# Patient Record
Sex: Female | Born: 1955 | Race: White | Hispanic: No | Marital: Married | Smoking: Former smoker
Health system: Southern US, Community
[De-identification: ages and names within clinical notes are randomized; demographics above are authoritative.]

## PROBLEM LIST (undated history)

## (undated) DIAGNOSIS — N12 Tubulo-interstitial nephritis, not specified as acute or chronic: Secondary | ICD-10-CM

## (undated) DIAGNOSIS — C801 Malignant (primary) neoplasm, unspecified: Secondary | ICD-10-CM

## (undated) DIAGNOSIS — N824 Other female intestinal-genital tract fistulae: Secondary | ICD-10-CM

## (undated) DIAGNOSIS — R42 Dizziness and giddiness: Secondary | ICD-10-CM

## (undated) DIAGNOSIS — N261 Atrophy of kidney (terminal): Secondary | ICD-10-CM

## (undated) DIAGNOSIS — E079 Disorder of thyroid, unspecified: Secondary | ICD-10-CM

## (undated) DIAGNOSIS — C539 Malignant neoplasm of cervix uteri, unspecified: Secondary | ICD-10-CM

## (undated) HISTORY — PX: CHOLECYSTECTOMY: SHX55

## (undated) HISTORY — DX: Other female intestinal-genital tract fistulae: N82.4

## (undated) HISTORY — PX: OTHER SURGICAL HISTORY: SHX169

## (undated) HISTORY — DX: Atrophy of kidney (terminal): N26.1

## (undated) HISTORY — DX: Tubulo-interstitial nephritis, not specified as acute or chronic: N12

## (undated) HISTORY — PX: EXPLORATORY LAPAROTOMY: SUR591

---

## 2001-06-17 HISTORY — PX: ABDOMINAL HYSTERECTOMY: SHX81

## 2001-07-20 ENCOUNTER — Other Ambulatory Visit: Admission: RE | Admit: 2001-07-20 | Discharge: 2001-07-20 | Payer: Self-pay | Admitting: *Deleted

## 2001-08-25 ENCOUNTER — Encounter (INDEPENDENT_AMBULATORY_CARE_PROVIDER_SITE_OTHER): Payer: Self-pay

## 2001-08-25 ENCOUNTER — Ambulatory Visit: Admission: RE | Admit: 2001-08-25 | Discharge: 2001-08-25 | Payer: Self-pay | Admitting: Gynecology

## 2001-08-25 ENCOUNTER — Ambulatory Visit: Admission: RE | Admit: 2001-08-25 | Discharge: 2001-11-23 | Payer: Self-pay | Admitting: Radiation Oncology

## 2001-08-26 ENCOUNTER — Ambulatory Visit (HOSPITAL_COMMUNITY): Admission: RE | Admit: 2001-08-26 | Discharge: 2001-08-26 | Payer: Self-pay | Admitting: Gynecology

## 2001-08-26 ENCOUNTER — Encounter: Payer: Self-pay | Admitting: Gynecology

## 2001-09-21 ENCOUNTER — Encounter (HOSPITAL_COMMUNITY): Admission: RE | Admit: 2001-09-21 | Discharge: 2001-12-20 | Payer: Self-pay | Admitting: Oncology

## 2001-10-27 ENCOUNTER — Inpatient Hospital Stay (HOSPITAL_COMMUNITY): Admission: RE | Admit: 2001-10-27 | Discharge: 2001-10-29 | Payer: Self-pay | Admitting: Radiation Oncology

## 2001-11-03 ENCOUNTER — Ambulatory Visit: Admission: RE | Admit: 2001-11-03 | Discharge: 2001-11-03 | Payer: Self-pay | Admitting: Gynecology

## 2001-11-30 ENCOUNTER — Ambulatory Visit: Admission: RE | Admit: 2001-11-30 | Discharge: 2001-11-30 | Payer: Self-pay | Admitting: Gynecologic Oncology

## 2001-12-15 ENCOUNTER — Inpatient Hospital Stay (HOSPITAL_COMMUNITY): Admission: RE | Admit: 2001-12-15 | Discharge: 2001-12-19 | Payer: Self-pay | Admitting: Gynecology

## 2001-12-15 ENCOUNTER — Encounter (INDEPENDENT_AMBULATORY_CARE_PROVIDER_SITE_OTHER): Payer: Self-pay

## 2002-01-15 ENCOUNTER — Encounter: Payer: Self-pay | Admitting: Emergency Medicine

## 2002-01-15 ENCOUNTER — Observation Stay (HOSPITAL_COMMUNITY): Admission: EM | Admit: 2002-01-15 | Discharge: 2002-01-16 | Payer: Self-pay | Admitting: Emergency Medicine

## 2002-02-03 ENCOUNTER — Ambulatory Visit: Admission: RE | Admit: 2002-02-03 | Discharge: 2002-02-03 | Payer: Self-pay | Admitting: Gynecology

## 2002-03-01 ENCOUNTER — Encounter: Payer: Self-pay | Admitting: Urology

## 2002-03-01 ENCOUNTER — Ambulatory Visit (HOSPITAL_COMMUNITY): Admission: RE | Admit: 2002-03-01 | Discharge: 2002-03-01 | Payer: Self-pay | Admitting: Urology

## 2002-03-24 ENCOUNTER — Encounter (INDEPENDENT_AMBULATORY_CARE_PROVIDER_SITE_OTHER): Payer: Self-pay | Admitting: Specialist

## 2002-03-24 ENCOUNTER — Ambulatory Visit: Admission: RE | Admit: 2002-03-24 | Discharge: 2002-03-24 | Payer: Self-pay | Admitting: Gynecology

## 2002-03-24 ENCOUNTER — Other Ambulatory Visit: Admission: RE | Admit: 2002-03-24 | Discharge: 2002-03-24 | Payer: Self-pay | Admitting: Gynecology

## 2002-05-19 ENCOUNTER — Ambulatory Visit: Admission: RE | Admit: 2002-05-19 | Discharge: 2002-05-19 | Payer: Self-pay | Admitting: Gynecology

## 2002-09-14 ENCOUNTER — Other Ambulatory Visit: Admission: RE | Admit: 2002-09-14 | Discharge: 2002-09-14 | Payer: Self-pay | Admitting: Gynecology

## 2002-09-14 ENCOUNTER — Ambulatory Visit: Admission: RE | Admit: 2002-09-14 | Discharge: 2002-09-14 | Payer: Self-pay | Admitting: Gynecology

## 2002-09-14 ENCOUNTER — Encounter (INDEPENDENT_AMBULATORY_CARE_PROVIDER_SITE_OTHER): Payer: Self-pay | Admitting: *Deleted

## 2003-01-11 ENCOUNTER — Encounter (INDEPENDENT_AMBULATORY_CARE_PROVIDER_SITE_OTHER): Payer: Self-pay | Admitting: *Deleted

## 2003-01-11 ENCOUNTER — Ambulatory Visit: Admission: RE | Admit: 2003-01-11 | Discharge: 2003-01-11 | Payer: Self-pay | Admitting: Gynecology

## 2003-01-11 ENCOUNTER — Other Ambulatory Visit: Admission: RE | Admit: 2003-01-11 | Discharge: 2003-01-11 | Payer: Self-pay | Admitting: Gynecology

## 2003-05-18 ENCOUNTER — Ambulatory Visit: Admission: RE | Admit: 2003-05-18 | Discharge: 2003-05-18 | Payer: Self-pay | Admitting: Gynecology

## 2003-05-18 ENCOUNTER — Encounter (INDEPENDENT_AMBULATORY_CARE_PROVIDER_SITE_OTHER): Payer: Self-pay | Admitting: Specialist

## 2003-05-18 ENCOUNTER — Other Ambulatory Visit: Admission: RE | Admit: 2003-05-18 | Discharge: 2003-05-18 | Payer: Self-pay | Admitting: Gynecology

## 2003-10-21 ENCOUNTER — Inpatient Hospital Stay (HOSPITAL_COMMUNITY): Admission: EM | Admit: 2003-10-21 | Discharge: 2003-10-26 | Payer: Self-pay | Admitting: Emergency Medicine

## 2003-10-22 HISTORY — PX: BILE DUCT EXPLORATION: SHX1225

## 2003-10-22 HISTORY — PX: CHOLECYSTECTOMY OPEN: SUR202

## 2003-10-23 ENCOUNTER — Encounter (INDEPENDENT_AMBULATORY_CARE_PROVIDER_SITE_OTHER): Payer: Self-pay | Admitting: Specialist

## 2003-11-16 ENCOUNTER — Ambulatory Visit (HOSPITAL_COMMUNITY): Admission: RE | Admit: 2003-11-16 | Discharge: 2003-11-16 | Payer: Self-pay | Admitting: General Surgery

## 2003-12-06 ENCOUNTER — Other Ambulatory Visit: Admission: RE | Admit: 2003-12-06 | Discharge: 2003-12-06 | Payer: Self-pay | Admitting: Gynecology

## 2003-12-06 ENCOUNTER — Encounter (INDEPENDENT_AMBULATORY_CARE_PROVIDER_SITE_OTHER): Payer: Self-pay | Admitting: *Deleted

## 2003-12-06 ENCOUNTER — Ambulatory Visit: Admission: RE | Admit: 2003-12-06 | Discharge: 2003-12-06 | Payer: Self-pay | Admitting: Gynecology

## 2004-01-09 ENCOUNTER — Ambulatory Visit (HOSPITAL_COMMUNITY): Admission: RE | Admit: 2004-01-09 | Discharge: 2004-01-09 | Payer: Self-pay | Admitting: General Surgery

## 2004-07-03 ENCOUNTER — Ambulatory Visit: Admission: RE | Admit: 2004-07-03 | Discharge: 2004-07-03 | Payer: Self-pay | Admitting: Gynecology

## 2004-07-03 ENCOUNTER — Other Ambulatory Visit: Admission: RE | Admit: 2004-07-03 | Discharge: 2004-07-03 | Payer: Self-pay | Admitting: Gynecology

## 2004-07-03 ENCOUNTER — Encounter (INDEPENDENT_AMBULATORY_CARE_PROVIDER_SITE_OTHER): Payer: Self-pay | Admitting: *Deleted

## 2005-01-11 ENCOUNTER — Ambulatory Visit: Admission: RE | Admit: 2005-01-11 | Discharge: 2005-01-11 | Payer: Self-pay | Admitting: Gynecology

## 2005-01-11 ENCOUNTER — Other Ambulatory Visit: Admission: RE | Admit: 2005-01-11 | Discharge: 2005-01-11 | Payer: Self-pay | Admitting: Gynecology

## 2005-01-11 ENCOUNTER — Encounter (INDEPENDENT_AMBULATORY_CARE_PROVIDER_SITE_OTHER): Payer: Self-pay | Admitting: *Deleted

## 2005-07-03 ENCOUNTER — Ambulatory Visit: Admission: RE | Admit: 2005-07-03 | Discharge: 2005-07-03 | Payer: Self-pay | Admitting: Gynecologic Oncology

## 2005-07-03 ENCOUNTER — Other Ambulatory Visit: Admission: RE | Admit: 2005-07-03 | Discharge: 2005-07-03 | Payer: Self-pay | Admitting: Gynecologic Oncology

## 2005-07-03 ENCOUNTER — Encounter (INDEPENDENT_AMBULATORY_CARE_PROVIDER_SITE_OTHER): Payer: Self-pay | Admitting: Specialist

## 2005-12-17 ENCOUNTER — Ambulatory Visit: Admission: RE | Admit: 2005-12-17 | Discharge: 2005-12-17 | Payer: Self-pay | Admitting: Gynecology

## 2005-12-17 ENCOUNTER — Encounter (INDEPENDENT_AMBULATORY_CARE_PROVIDER_SITE_OTHER): Payer: Self-pay | Admitting: Specialist

## 2005-12-17 ENCOUNTER — Other Ambulatory Visit: Admission: RE | Admit: 2005-12-17 | Discharge: 2005-12-17 | Payer: Self-pay | Admitting: Gynecology

## 2006-07-02 ENCOUNTER — Other Ambulatory Visit: Admission: RE | Admit: 2006-07-02 | Discharge: 2006-07-02 | Payer: Self-pay | Admitting: Gynecology

## 2006-07-02 ENCOUNTER — Ambulatory Visit: Admission: RE | Admit: 2006-07-02 | Discharge: 2006-07-02 | Payer: Self-pay | Admitting: Gynecology

## 2006-07-02 ENCOUNTER — Encounter (INDEPENDENT_AMBULATORY_CARE_PROVIDER_SITE_OTHER): Payer: Self-pay | Admitting: *Deleted

## 2007-01-09 ENCOUNTER — Ambulatory Visit: Admission: RE | Admit: 2007-01-09 | Discharge: 2007-01-09 | Payer: Self-pay | Admitting: Gynecology

## 2007-01-09 ENCOUNTER — Encounter: Payer: Self-pay | Admitting: Gynecology

## 2007-01-09 ENCOUNTER — Other Ambulatory Visit: Admission: RE | Admit: 2007-01-09 | Discharge: 2007-01-09 | Payer: Self-pay | Admitting: Gynecology

## 2007-12-30 ENCOUNTER — Encounter: Payer: Self-pay | Admitting: Gynecology

## 2007-12-30 ENCOUNTER — Ambulatory Visit: Admission: RE | Admit: 2007-12-30 | Discharge: 2007-12-30 | Payer: Self-pay | Admitting: Gynecology

## 2007-12-30 ENCOUNTER — Other Ambulatory Visit: Admission: RE | Admit: 2007-12-30 | Discharge: 2007-12-30 | Payer: Self-pay | Admitting: Gynecology

## 2008-10-19 ENCOUNTER — Ambulatory Visit (HOSPITAL_COMMUNITY): Admission: RE | Admit: 2008-10-19 | Discharge: 2008-10-19 | Payer: Self-pay | Admitting: Urology

## 2008-11-21 ENCOUNTER — Ambulatory Visit (HOSPITAL_BASED_OUTPATIENT_CLINIC_OR_DEPARTMENT_OTHER): Admission: RE | Admit: 2008-11-21 | Discharge: 2008-11-21 | Payer: Self-pay | Admitting: Urology

## 2008-12-22 ENCOUNTER — Ambulatory Visit (HOSPITAL_COMMUNITY): Admission: RE | Admit: 2008-12-22 | Discharge: 2008-12-22 | Payer: Self-pay | Admitting: Urology

## 2009-01-13 ENCOUNTER — Other Ambulatory Visit: Admission: RE | Admit: 2009-01-13 | Discharge: 2009-01-13 | Payer: Self-pay | Admitting: Gynecology

## 2009-01-13 ENCOUNTER — Ambulatory Visit: Admission: RE | Admit: 2009-01-13 | Discharge: 2009-01-13 | Payer: Self-pay | Admitting: Gynecology

## 2009-01-13 ENCOUNTER — Encounter: Payer: Self-pay | Admitting: Gynecology

## 2009-12-27 ENCOUNTER — Other Ambulatory Visit: Admission: RE | Admit: 2009-12-27 | Discharge: 2009-12-27 | Payer: Self-pay | Admitting: Neurology

## 2009-12-27 ENCOUNTER — Ambulatory Visit: Admission: RE | Admit: 2009-12-27 | Discharge: 2009-12-27 | Payer: Self-pay | Admitting: Gynecology

## 2010-09-24 LAB — POCT HEMOGLOBIN-HEMACUE: Hemoglobin: 14.4 g/dL (ref 12.0–15.0)

## 2010-10-30 NOTE — Consult Note (Signed)
[name], [name] NO.:  [id number]   MEDICAL RECORD NO.:  [id number]          PATIENT TYPE:  OUT   LOCATION:  GYN                          FACILITY:  [facility]   PHYSICIAN:  De Blanch, M.D.DATE OF BIRTH:  April 18, 1956   DATE OF CONSULTATION:  12/30/2007  DATE OF DISCHARGE:                                 CONSULTATION   CHIEF COMPLAINT:  Cervical cancer.   INTERVAL HISTORY:  The patient is here today for continuing followup of  cervical cancer.  Since her last visit, she has done well.  She is very  happy that she has been able to stop smoking for the last 4 months.  In  addition, she has had some weight loss which is a good thing.  From a  gynecologic point of view, she denies any GI or GU symptoms, has no  pelvic pain, pressure, vaginal bleeding or discharge.   Her functional status has been excellent.  She has only had one urinary  tract infection.  She is scheduled to see Dr. Wanda Plump in the near  future.  She is scheduled to have mammograms in September.   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  She was initially diagnosed and treated in 2003.  She  received external beam radiation therapy but because of poor anatomy,  was unable to have brachytherapy and therefore had a hysterectomy.  She  had postoperative radionecrosis of the upper vagina and transient  bilateral hydronephrosis.  This has been managed by Dr. Wanda Plump.   PAST MEDICAL HISTORY:  Medical illnesses:  None.   PAST SURGICAL HISTORY:  Total abdominal hysterectomy, bilateral salpingo-  oophorectomy.  Cholecystectomy.   OBSTETRICAL HISTORY:  Gravida 1.   CURRENT MEDICATIONS:  1. Premarin 0.625 mg daily.  2. Effexor 37.5 mg nightly.   SOCIAL HISTORY:  The patient is retired and married.  She is a former  smoker.   FAMILY HISTORY:  Negative for gynecologic, breast or colon cancer.   REVIEW OF SYSTEMS:  10-point comprehensive  review of systems negative  except as noted above.   PHYSICAL EXAMINATION:  VITAL SIGNS:  Weight 151 pounds (down 5 pounds  since last year).  GENERAL:  The patient is a healthy white female in no acute distress.  HEENT:  Negative.  NECK:  Supple without thyromegaly.  LYMPH:  There is no supraclavicular or inguinal adenopathy.  The abdomen  is soft, nontender, no masses, organomegaly, ascites or hernias noted.  PELVIC EXAM:  EGBUS, vagina, urethra are normal.  On bimanual exam,  however, there is a considerable amount of radiation fibrosis throughout  the pelvis.  No nodularity or masses are noted.   IMPRESSION:  Stage I-B squamous cell carcinoma of the cervix, 2003.  No  evidence of recurrent disease.   PLAN:  Pap smears are obtained.  The patient will return to see me in 1  year.  She will follow up with Dr. Wanda Plump regarding her urological  issues.      De Blanch, M.D.  Electronically Signed  DC/MEDQ  D:  12/30/2007  T:  12/30/2007  Job:  [id number]   cc:   Billie Lade, M.D.  Fax: [phone]   Veda Canning, R.N.  [address]  [place], [place] [address]   Boston Service, M.D.  Fax: [phone]

## 2010-10-30 NOTE — Consult Note (Signed)
NAMEELIM, PEALE NO.:  [id number]   MEDICAL RECORD NO.:  [id number]          PATIENT TYPE:  OUT   LOCATION:  GYN                          FACILITY:  [facility]   PHYSICIAN:  De Blanch, M.D.DATE OF BIRTH:  06/03/1956   DATE OF CONSULTATION:  01/13/2009  DATE OF DISCHARGE:                                 CONSULTATION   CHIEF COMPLAINT:  Cervical cancer, ureteral obstruction.   INTERVAL HISTORY:  The patient turns today for continuing followup now 7  years since radiation therapy and extrafascial hysterectomy for  treatment of a stage IB grade 1 squamous cell carcinoma of the cervix.  Since her last visit, she has seen Dr. Retta Diones with an obstructed left  ureter. A stent was placed and it has subsequently been removed.  The  patient denies any flank pain, has no fever or chills or any other  urinary tract symptoms.  I do not have all of the records, although the  patient seems to indicate that a Lasix renal scan showed adequate  function on the left without a stent.   HISTORY OF PRESENT ILLNESS:  Stage IB grade 1 squamous cell carcinoma of  the cervix initially diagnosed 2003.  She received external beam  radiation therapy.  She had poor anatomy and brachytherapy was  successful.  Therefore, she underwent an extrafascial hysterectomy  bilateral salpingo-oophorectomy.  She developed postoperative  radionecrosis of the upper vagina and transient bilateral  hydronephrosis.   PAST MEDICAL HISTORY/MEDICAL ILLNESSES:  None.   PAST SURGICAL HISTORY:  Total abdominal hysterectomy, bilateral salpingo-  oophorectomy, cholecystectomy.   OBSTETRICAL HISTORY:  Gravida 1.   CURRENT MEDICATIONS:  1. Premarin 0.625 mg daily.  2. Effexor 37.5 mg nightly.   SOCIAL HISTORY:  The patient is retired and married.  She is a former  smoker.   FAMILY HISTORY:  Negative for gynecologic, breast or colon cancer.   REVIEW OF SYSTEMS:  A 10-point comprehensive review  of systems negative  except as noted above.   PHYSICAL EXAMINATION:  VITAL SIGNS:  Weight 152 pounds, blood pressure  130/78.  GENERAL:  The patient is a healthy white female in no acute distress.  HEENT:  Negative.  NECK:  Supple without thyromegaly.  There is no supraclavicular or  inguinal adenopathy.  ABDOMEN:  Soft, nontender.  No mass, organomegaly, ascites or hernias  noted.  PELVIC:  EGBUS, vagina, bladder and urethra are normal.  Cervix  and uterus are surgically absent.  On bimanual examination, there is  extensive radiation fibrosis throughout the pelvis without any  nodularity, masses or pain.   IMPRESSION:  Stage IB grade 1 squamous cell carcinoma of the cervix  2003, no evidence of recurrent disease.   PLAN:  1. Pap smears were obtained.  2. The patient will continue under the care of Dr. Retta Diones regarding      her ureteral obstruction.  3. She will return to see me in 1 year for continuing followup.  4. The patient will arrange to have mammograms obtained in [place].      De Blanch, M.D.  Electronically  Signed     DC/MEDQ  D:  01/13/2009  T:  01/13/2009  Job:  [id number]   cc:   Billie Lade, M.D.  Fax: [phone]   [facility]   Bertram Millard. Dahlstedt, M.D.  Fax: [phone]   Telford Nab, R.N.  [address]  [place], [place] [address]

## 2010-10-30 NOTE — Consult Note (Signed)
NAMEKADAJAH, KJOS NO.:  [id number]   MEDICAL RECORD NO.:  [id number]          PATIENT TYPE:  OUT   LOCATION:  GYN                          FACILITY:  [facility]   PHYSICIAN:  De Blanch, M.D.DATE OF BIRTH:  01/03/1956   DATE OF CONSULTATION:  01/09/2007  DATE OF DISCHARGE:                                 CONSULTATION   CHIEF COMPLAINT:  Cervical cancer.   INTERVAL HISTORY:  Patient returns today for continuing followup of her  cervical cancer.  Five years ago, we performed a completion abdominal  hysterectomy, which was preceded by radiation therapy.  She has been  followed since that time with no evidence of recurrent disease.  She has  difficulties with her bladder.  Specifically, she has bladder spasms  about three to four times a year.  In addition, she has mild  hydronephrosis.  She saw Dr. Boston Service on July 3.  She  apparently is having slow resolution of her left-sided hydronephrosis.  There is no other evidence of urinary tract infection.  Patient denies  any other GI or GU symptoms, has no pelvic pain, pressure, vaginal  bleeding or discharge.  Functional status is excellent.  She continue to  work full-time.   HISTORY OF PRESENT ILLNESS:  Stage I-B, grade 1 squamous cell carcinoma  of the cervix, initially diagnosed in 2003.  She was treated with  external beam radiation therapy and then a completion abdominal  hysterectomy in July of 2003.  Brachytherapy could not be performed  because of anatomic distortion.  Patient had postoperative complications  of radionecrosis of the upper vagina and bilateral hydronephrosis.   PAST MEDICAL HISTORY:  MEDICAL ILLNESSES:  None.  PAST SURGICAL HISTORY:  TAH, BSO, cholecystectomy.  OBSTETRICAL HISTORY:  Gravida 1.   CURRENT MEDICATIONS:  1. Premarin 0.625 mg daily.  2. Effexor 37.5 mg nightly.   SOCIAL HISTORY:  The patient is retired.  She smokes one pack per day.   FAMILY HISTORY:   Negative for gynecologic, breast or colon cancer.   REVIEW OF SYSTEMS:  Ten-point comprehensive review of systems negative,  except as noted above.   PHYSICAL EXAM:  Weight 156 pounds, blood pressure 130/70, pulse 80,  respiratory rate 20.  GENERAL:  The patient is a healthy white female, in no acute distress.  HEENT:  Negative.  NECK:  Supple without thyromegaly.  There is no supraclavicular or  inguinal adenopathy.  ABDOMEN:  Soft, nontender, no masses, organomegaly, ascites or hernias  noted.  There is no CVA tenderness.  PELVIC EXAM:  EG, BUS normal.  The vagina has good length and caliber,  although there is a considerable amount of radiation fibrosis  throughout.  No lesions are noted.  Pap smears are obtained.  Bimanual  exam reveals radiation fibrosis throughout the pelvis.  Rectovaginal  exam confirms.  No masses or nodularity are noted.  LOWER EXTREMITIES:  Without edema or varicosities.   IMPRESSION:  Stage I-B squamous cell carcinoma of the cervix, 2003.  No  evidence of recurrent disease.  Pap smears are obtained.  The patient  was given renewed prescription for Premarin, Effexor and Aldactazide.  She will return to see me in one year.  She will continue to follow with  Dr. Boston Service with regard to her urinary tract symptoms.      De Blanch, M.D.  Electronically Signed     DC/MEDQ  D:  01/09/2007  T:  01/09/2007  Job:  [id number]   cc:   Boston Service, M.D.  Fax: [phone]   Billie Lade, M.D.  Fax: [phone]   Veda Canning, R.N.  [address]  [place], [place] [address]

## 2010-10-30 NOTE — Op Note (Signed)
[name], [name]                 ACCOUNT NO.:  [id number]   MEDICAL RECORD NO.:  [id number]          PATIENT TYPE:  AMB   LOCATION:  [facility]                         FACILITY:  [facility]   PHYSICIAN:  Bertram Millard. Dahlstedt, M.D.DATE OF BIRTH:  [birth year]   DATE OF PROCEDURE:  11/21/2008  DATE OF DISCHARGE:                               OPERATIVE REPORT   PREOPERATIVE DIAGNOSIS:  Left hydronephrosis with renal atrophy.   POSTOPERATIVE DIAGNOSIS:  Left hydronephrosis with renal atrophy with  left distal ureteral stricture.   SURGICAL PROCEDURES:  Cystoscopy, left retrograde ureteral pyelogram,  double-J stent placement on the left (24 cm x 5-French Polaris stent).   SURGEON:  Bertram Millard. Dahlstedt, M.D.   ANESTHESIA:  General with LMA.   COMPLICATIONS:  None.   BRIEF HISTORY:  A 55 year old female who is years out from hysterectomy  followed by radiation and chemotherapy for cervical cancer.  She has  been followed through the years by Dr. Wanda Plump.  She was recently  found to have significantly worsened hydronephrosis on the left with  actually fairly poor split renal function on that side.  It was  recommended the patient have, at the very least, temporary stent  placement followed by repeat study of her split renal functions.  If the  function in that left renal unit improved, it was recommended that she  undergo ureteroneocystostomy.  If there is no significant improvement,  we will deal with just that atrophic kidney, and take the stent out  without significant repair.  She is aware of risks and complications as  well as alternatives to the procedure.  She understands these and  desires to proceed.   DESCRIPTION OF PROCEDURE:  The patient was administered preoperative IV  antibiotics and the side of her surgery marked.  She was identified,  taken to the operating room where general anesthetic was administered  using LMA.  She was placed in the dorsal lithotomy position.   Genitalia  and perineum were prepped and draped.  Time-out was then called.   Procedure then commenced.  Her urethra was dilated to 24-French with  female sounds.  Bladder was then inspected circumferentially.  There  were generalized telangiectasias secondary to her radiation.  No lesions  were seen.  The bladder mucosa was somewhat pale, and there was no  evidence of trabeculations or foreign bodies.  Both ureteral orifices  were normal in configuration and location.  The left ureter was  cannulated with a 6-French open-ended catheter and retrograde performed.  This revealed moderate stricture of the distal 3-4 cm of the left  ureter.  There was hydro, especially above the L3 level on the left.  There was blunting in the caliceal system.  No filling defects were  seen.  At this point a guidewire was advanced through the catheter, and  the catheter removed.  A 24 cm x 5-French Polaris stent was then placed  using cystoscopic and fluoroscopic guidance.  After withdrawal the wire,  a good curl was seen proximally, and the distal end of the stent was  present just at the ureteral  orifice.  The bladder was then drained.  The procedure was terminated after the scope was removed.   The patient tolerated procedure well.  She was awakened and taken to the  PACU in stable condition.  She will follow up in approximately 2 weeks.  She was discharged on Urelle for bladder spasms as well as Bactrim DS  one p.o. b.i.d. for 3 days.      Bertram Millard. Dahlstedt, M.D.  Electronically Signed     SMD/MEDQ  D:  11/21/2008  T:  11/21/2008  Job:  [id number]   cc:   [place]

## 2010-11-02 NOTE — Consult Note (Signed)
[facility]  Patient:    [name], [name] Visit Number: [id number] MRN: [id number]          Service Type: SUR Location: *N Attending Physician:  Jeneen Montgomery Dictated by:   Rande Brunt Clarke-Pearson, M.D.   CC:         Billie Lade, M.D.  Telford Nab, R.N.   Consultation Report  RECONSULTATION  A 55 year old white female seen in consultation at the request of Dr. Antony Blackbird.  The patient has a stage IB 2 squamous cell carcinoma of the cervix which at initial evaluation was some 8 cm in diameter.  She has received whole pelvis and periaortic radiation therapy followed by an intracavitary cesium application on May 13.  At that time the examination under anesthesia revealed that she had had an excellent response, although her cervix was approximately 4-5 cm in diameter.  Currently, the patient has minimal vaginal discharge and no bleeding.  She has no GI or GU symptoms.  She seems to have tolerated the radiation therapy very well.  Dr. Roselind Messier is particularly interested in the possibility of me having perform a hysterectomy for completion of her therapy since she does still have a certain disease volume in her cervix as well as the fact that the uterus is enlarged from fibroids and feels that a second tandem and ovoid application might have poor anatomic placement.  PAST MEDICAL HISTORY:  None.  PAST SURGICAL HISTORY:  None.  OBSTETRICAL HISTORY:  Gravida 1.  MEDICATIONS:  None.  REVIEW OF SYSTEMS:  No GI, GU, or neurologic symptoms.  PHYSICAL EXAMINATION  GENERAL:  The patient is a pleasant white female in no acute distress.  HEENT:  Negative.  NECK:  Supple without thyromegaly.  LYMPH:  There is no supraclavicular or inguinal adenopathy.  ABDOMEN:  Mildly obese, soft, nontender.  No mass, organomegaly, ascites, or hernias are noted.  She does have some skin changes in the lower abdominal fold secondary to radiation  therapy.  PELVIC:  EGBUS normal.  Vagina is clean.  The grossly enlarged cervix is now absent and appears flush with the vaginal vault.  No vaginal lesions are noted.  On bimanual examination the cervix does feel hard.  It is approximately 5 cm in diameter.  The uterus is approximately [redacted] weeks gestational size and irregular consistent with fibroids.  IMPRESSION:  Stage IB 2 squamous cell carcinoma of the cervix with an excellent response to radiation therapy thus far.  I agree with Dr. Roselind Messier the patient does still have central disease and that hysterectomy would be appropriate for completion of her therapy.  I had a lengthy discussion with the patient and her husband regarding this recommendation.  Given the size of her cervix and the desire to obtain free surgical margins, I will believe a modified radical hysterectomy would be most appropriate.  In addition, we would like to obtain an adequate cervical vaginal margin.  Patient is in agreement with this plan and we will schedule surgery in conjunction with a gynecologist in [place].  The risk of surgery including hemorrhage, infection, injury to adjacent viscera and with an emphasis on bladder dysfunction caused by radical hysterectomy were outlined to the patient and her husband.  They are aware of these risks and wish to proceed. Dictated by:   Rande Brunt. Clarke-Pearson, M.D. Attending Physician:  Jeneen Montgomery DD:  11/03/01 TD:  11/05/01 Job: [id number]

## 2010-11-02 NOTE — Consult Note (Signed)
NAME:  [name], [name]                           ACCOUNT NO.:  [id number]   MEDICAL RECORD NO.:  [id number]                   PATIENT TYPE:  OUT   LOCATION:  GYN                                  FACILITY:  [facility]   PHYSICIAN:  Daniel L. Clarke-Pearson, M.D.      DATE OF BIRTH:  1955/10/22   DATE OF CONSULTATION:  03/24/2002  DATE OF DISCHARGE:                                   CONSULTATION   A 55 year old white female returns for continuing follow-up having undergone  a completion abdominal hysterectomy on December 15, 2001.  Final pathology showed  no residual tumor in the uterus.  The patient had previously been treated  with a full course of radiation therapy.  Postoperatively she developed  vaginal vault necrosis and ureteral obstruction with hydronephrosis.  She  was readmitted briefly with fever and nausea and vomiting.  Since I saw the  patient last in August, she has had steady improvement.  She says she is  especially better once she stopped taking the medications that she had been  prescribed and got them all out of her body.  She has been using a half-  strength peroxide douche on occasion, but has not been as consistent with  this as I had wished.  She notes some heaviness in the lower abdomen and  edema over the mons.  Today she denies any fevers, chills, or any GI or GU  symptoms.  She has no bladder or kidney problems.  She does have some  trouble with constipation.   FAMILY HISTORY:  Reviewed.   SOCIAL HISTORY:  Reviewed.  The patient comes accompanied by her husband  today.   REVIEW OF SYMPTOMS:  No cardiovascular, pulmonary, musculoskeletal,  neurologic, or GI symptoms except for constipation.   PHYSICAL EXAMINATION:  VITAL SIGNS:  Weight 133 pounds, blood pressure  160/100.  GENERAL:  The patient is a healthy, pleasant white female in no acute  distress.  HEENT:  Negative.  NECK:  Supple without thyromegaly.  LYMPH:  There is no supraclavicular or inguinal  adenopathy.  ABDOMEN:  Soft, nontender.  No mass, organomegaly, ascites, or hernias are  noted.  She does have a considerable amount of edema over her mons.  EXTREMITIES:  Lower extremities without edema.  PELVIC:  EGBUS, vagina, bladder, urethra are normal except that the upper  vaginal vault is still open, but necrosis is much less than before.  Bimanual examination reveals radiation fibrosis without any discreet masses  or nodularity.   IMPRESSION:  Radionecrosis of the upper vagina.  I have encouraged the  patient to become more dutiful in performing her peroxide douches in hopes  of cleaning up this area over the next couple of months.   She has also been evaluated by Dr. Boston Service and had a nuclear  medicine renal flow study showing slightly more function on the right than  the left with bilateral hydronephrosis, but no significant response to  Lasix  on either side.  Dr. Wanda Plump has felt that placement of the stent was  unnecessary but he plans on repeating a Lasix renal scan in approximately  two to three months.  She will return to see me shortly thereafter.                                               Daniel L. Stanford Breed, M.D.    DLC/MEDQ  D:  03/24/2002  T:  03/24/2002  Job:  [id number]   cc:   Boston Service, M.D.  [address]  [place]  [place] [address]  Fax: [phone]   Telford Nab, R.N.  [address] [place], [place] [address]  Fax: [phone]   [facility]  [address]   Billie Lade, M.D.  [address] - [facility]  [place]  [place]  [address]  Fax: [phone]

## 2010-11-02 NOTE — Consult Note (Signed)
NAME:  [name], [name]                           ACCOUNT NO.:  [id number]   MEDICAL RECORD NO.:  [id number]                   PATIENT TYPE:  OUT   LOCATION:  GYN                                  FACILITY:  [facility]   PHYSICIAN:  De Blanch, M.D.         DATE OF BIRTH:  [birth year]   DATE OF CONSULTATION:  12/06/2003  DATE OF DISCHARGE:                                   CONSULTATION   REASON FOR CONSULTATION:  A 55 year old white female returns for continuing  follow up of stage IB2 squamous cell carcinoma of the cervix.   INTERVAL HISTORY:  Since her last visit, the patient has had no  gynecological symptoms.  She specifically denies any vaginal bleeding or  discharge.  She is taking Premarin 0.625 mg daily and Effexor 37.5 mg q.h.s.  and is doing well with her hot flashes.  She has no other gynecologic  symptoms.   Since her last visit, she had had an open cholecystectomy and still has a T-  tube drain in place.   HISTORY OF PRESENT ILLNESS:  The patient was found to have a stage IB2  squamous cell carcinoma of the cervix in May of 2003 and received radiation  therapy.  Because of difficulty placing the tandem safely, she underwent a  complete hysterectomy in July of 2003.  She subsequently had postoperative  vault necrosis and partial ureteral obstruction which have both resolved.   PAST MEDICAL HISTORY:   MEDICAL ILLNESSES:  None.   PAST SURGICAL HISTORY:  TAHBSO.   OBSTETRICAL HISTORY:  Gravida 1.   CURRENT MEDICATIONS:  Premarin 0.625 mg p.o. and Effexor.   SOCIAL HISTORY:  The patient is retired.  She smokes 1 to 2 packs a day.   REVIEW OF SYSTEMS:  Negative except as noted above.   PHYSICAL EXAMINATION:  VITAL SIGNS:  Weight 135 pounds, blood pressure  118/74.  GENERAL:  The patient is a healthy white female in no acute distress.  HEENT:  Negative.  NECK:  Supple without thyromegaly.  LYMPHATICS:  There is no supraclavicular or inguinal adenopathy.  ABDOMEN:  Soft, nontender.  No masses, organomegaly, ascites, or hernias are  noted.  Her cholecystectomy scar is healing well.  PELVIC:  EGBUS, vagina, bladder, urethra are normal.  There is radiation  changes in the vagina and it is somewhat stenotic near the apex.  Bimanual  and rectovaginal exam reveals firm radiation fibrosis changes without any  discrete masses or nodularity.  Rectovaginal exam confirms.   IMPRESSION:  Stage IB2 squamous cell carcinoma of the cervix in May of 2003.  No evidence of recurrent disease.   PLAN:  Pap smears are obtained.  The patient is given a new prescription for  Effexor 37.5 mg and Premarin 0.625 mg daily.  She will return to see [place] in 6  months for continuing follow up.  De Blanch, M.D.    DC/MEDQ  D:  12/06/2003  T:  12/07/2003  Job:  [id number]   cc:   Boston Service, M.D.  [address]  [place]  [place] [address]  Fax: [phone]   Billie Lade, M.D.  [address] [facility]  [place]  [place] [address]  Fax: [phone]   Sung Amabile. Roslyn Smiling, M.D.  [address]  [place]  [place] [address]  Fax: [phone]   Telford Nab, R.N.  [phone]  [place], [place] [address]

## 2010-11-02 NOTE — Consult Note (Signed)
NAMEROCQUEL, ASKREN NO.:  [id number]   MEDICAL RECORD NO.:  [id number]          PATIENT TYPE:  OUT   LOCATION:  GYN                          FACILITY:  [facility]   PHYSICIAN:  De Blanch, M.D.DATE OF BIRTH:  Aug 23, 1955   DATE OF CONSULTATION:  DATE OF DISCHARGE:                                   CONSULTATION   CHIEF COMPLAINT:  Cervical cancer.   INTERVAL HISTORY:  Since her last visit, the patient has done well.  She  denies any GI or GU symptoms.  She has seen Dr. Wanda Plump recently, and he  will see her again in one year.  She has no pelvic pain, pressure, vaginal  bleeding, or discharge.  Her functional status is excellent.   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  Her postoperative course was  complicated by vaginal vault necrosis.  Since then, she has done well.  The  patient does have chronic bilateral hydronephrosis, followed by Dr. Boston Service.   PAST MEDICAL HISTORY:  None.   PAST SURGICAL HISTORY:  TAH/BSO, cholecystectomy.   OBSTETRICAL HISTORY:  Gravida 1.   CURRENT MEDICATIONS:  Premarin 0.625 mg daily, Effexor 37.5 mg q.h.s.   SOCIAL HISTORY:  Patient is retired.  She smokes one pack per day.   FAMILY HISTORY:  Negative for gynecologic, breast, or colon cancer.   REVIEW OF SYSTEMS:  A 10-point comprehensive review of systems is negative,  except as noted above.   PHYSICAL EXAMINATION:  VITAL SIGNS:  Weight 150 pounds.  Blood pressure  120/80, pulse 80, respiratory rate 20.  GENERAL:  Patient is a healthy white female in no acute distress.  HEENT:  Negative.  NECK:  Supple without thyromegaly.  There is no supraclavicular or inguinal  adenopathy.  ABDOMEN:  Soft and nontender.  No mass, organomegaly, ascites, or hernias  are noted.  PELVIC:  EG/BUS, vagina,  bladder, and urethra are normal.  Cervix and uterus  are surgically absent.  Bimanual and rectovaginal reveal considerable  radiate fibrosis without any nodularity or tenderness.  EXTREMITIES:  Lower extremities are without edema and varicosities.   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  Pap smears have been sent.   The patient will return to see [place] in six months.  Pap smears are obtained.  She is also given a new prescription for Premarin and a prescription for  Aldactazide 25 mg to be used for fluid retention as needed once daily.      De Blanch, M.D.  Electronically Signed     DC/MEDQ  D:  12/17/2005  T:  12/17/2005  Job:  [id number]   cc:   Billie Lade, M.D.  Fax: [phone]   Sung Amabile. Roslyn Smiling, M.D.  Fax: [phone]   Telford Nab, R.N.  [address]  [place], [place] [address]

## 2010-11-02 NOTE — Op Note (Signed)
[facility]  Patient:    [name], [name] Visit Number: [id number] MRN: [id number]          Service Type: GYN Location: 4W 0473 01 Attending Physician:  Antionette Char Dictated by:   Jackquline Denmark. [name], M.D. Proc. Date: 12/15/01 Admit Date:  12/15/2001   CC:         Telford Nab, R.N.  Quita Skye. Artis Flock, M.D.  Marnee Guarneri, M.D.  Sung Amabile Roslyn Smiling, M.D.   Operative Report  PREOPERATIVE DIAGNOSIS:  Stage 1B-2 squamous cell carcinoma of the cervix post chemo-radiation.  POSTOPERATIVE DIAGNOSIS: 1. Stage 1B-2 squamous cell carcinoma of the cervix post chemo-radiation. 2. Endometriosis. 3. Leiomyomata uteri. 4. Bilateral salpingo-oophorectomy.  OPERATION: 1. Exploratory laparotomy with modified radical hysterectomy (type 2). 2. Resection of left adnexal endometrioma.  SURGEON:  John T. [name], M.D.  ASSISTANT:  Roseanna Rainbow, M.D.  ANESTHESIA:  General endotracheal anesthesia.  DESCRIPTION OF FINDINGS AND INDICATION FOR SURGERY:  This 55 year old woman presented with a bulky, 8 cm in diameter, squamous cell carcinoma of the cervix.  She was treated with cold pelvic and aortic radiotherapy followed by an intracavitary cesium application.  She received cisplatin throughout and, because of suspicious findings for persistent disease and poor anatomy for a second placement of cesium, it was elected to perform a "completion" modified radical hysterectomy.  Prior to surgery, the patient was counselled extensively regarding risks and benefits, and questions were answered before informed consent was obtained.  Examination under anesthesia revealed a 12-week size uterus with expanded lower segment/cervical region and friability with central necrosis at the cervical os.  Upon entering the abdominal cavity, the uterus was found to be 12-week in size and involved multiple intramural leiomyomata.  The left tube and ovary formed a cystic  complex that was adherent to the distal sigmoid colon and cervix and left pelvic side wall, contributing to the overall impression of expanded lower uterine segment.  There was markedly reactive material in the left side wall region; frozen section biopsy revealed this to be necrosis with findings suggesting endometriosis.  Right tube and ovary were normal.  There was no palpable lymphadenopathy.  DESCRIPTION OF PROCEDURE:  The patient was prepped and draped in the low lithotomy position using direct placement stirrups with an indwelling Foley catheter.  Exploration was carried out through a transverse Pfannenstiel incision which was developed with sharp dissection and electrocautery for hemostasis.  The peritoneum was opened vertically.  Manual and visual exploration revealed no evidence of upper abdominal carcinomatosis or enlarged retroperitoneal nodes.  There was minimal reactive fluid in the pelvis.  The Buchwalter retractor was used to pack the bowel out of the pelvis and provide exposure.  The cornu of the uterus bilaterally were grasped with long Kelly clamps.  On the left side, a 4 cm mass was densely adherent to the distal sigmoid colon, posterior cervix, and side wall.  The round ligaments bilaterally were transected with electrocautery.  The anterior and posterior leaves of the broad ligament were opened with electrocautery and retroperitoneal spaces partially developed.  The ureters bilaterally were identified and infundibulopelvic ligaments isolated above the ureters, crossclamped, divided, and ligated by free tie and suture ligature. The spaces were fully developed and were markedly reactive from prior radiation.  The anterior vesicouterine ligaments were skeletonized and placed on traction. Each uterine artery was isolated and clipped at its origin in an attempt to obtain hemostasis.  The cardinal ligaments were developed and divided bilaterally with the reticulating  ILA  staplers.  Distal ureters were mobilized.  Using sharp and blunt dissection, the left adnexal mass was dissected off the rectum and left side wall.  Frozen section was obtained of markedly fibrotic tissue in the is region, and this returned positive for presumed endometriosis.  The bladder flap was developed with some difficulty as this was adherent, and it was mobilized further.  The ureters were isolated to the point of their insertion in to the ureter tunnels.  Parametrial tissue over the ureters were isolated with a right-angle clamp, crossclamped, divided, and suture ligated.  The ureters were rolled laterally and cardinal ligaments and parametrial tissues clamped inside the ureter, divided, and suture ligated.  Because of the fibrosis in the posterior pelvis, separate uterosacral ligament pedicles were not made.  The upper vagina was crossclamped, divided, and suture ligated with 0 Vicryl, and specimen was handed off.   Of note, after controlling the uterine vessels, the anterior cervical fibroids were removed to provide visualization throughout the surgery.  The vagina cuff was closed with interrupted figure-of-eight sutures of 0 Vicryl.  The pelvis was copiously irrigated and additional hemostasis achieved where necessary with electrocautery.  All sponges and retractors were removed, and the ureters were observed after instilling indigo carmine intravenously. There was no leakage of indigo carmine and no observed obstruction of the ureters to their point of entry in the bladder.  The sponges and retractors were removed and abdominal wall closed in layers using 0 Vicryl in a running closure of rectus fascia and skin clips to close the skin.  Because this was a type 2 radical hysterectomy, suprapubic catheter was not placed; catheter will  remain to straight drainage for two days, and postvoid residuals will be checked after catheter removal.  The patient was returned to the  recovery room in stable condition.  Estimated blood loss was 650 cc, transfusions none (intraoperative hemoglobin equals 10),  Sponge and instrument counts correct x 2.  Pathology specimen: Uterus, tubes, and ovaries. Dictated by:   Jackquline Denmark. [name], M.D. Attending Physician:  Antionette Char DD:  12/15/01 TD:  12/18/01 Job: [id number]

## 2010-11-02 NOTE — Consult Note (Signed)
NAME:  [name], [name]                           ACCOUNT NO.:  [id number]   MEDICAL RECORD NO.:  [id number]                   PATIENT TYPE:  OUT   LOCATION:  GYN                                  FACILITY:  [facility]   PHYSICIAN:  De Blanch, M.D.         DATE OF BIRTH:  1956-03-11   DATE OF CONSULTATION:  05/18/2003  DATE OF DISCHARGE:                                   CONSULTATION   REASON FOR CONSULTATION:  A 55 year old white female returns for continuing  followup of stage IB2 squamous cell carcinoma of the cervix.   INTERVAL HISTORY:  Since her last visit the patient has done remarkably  well.  She is recently having no GI or GU symptoms.  She denies any pelvic  pain, pressure, vaginal bleeding or discharge.   With regard to her hot flashes, she seems to be doing quite well using  Effexor 37.5 mg daily.  In fact, she is quite happy with results that she  has achieved.   HISTORY OF PRESENT ILLNESS:  The patient received radiation therapy in May  2003, for a stage IB2 squamous cell carcinoma of the cervix.  Because of  inadequate central dosing, she underwent a completion hysterectomy and  bilateral salpingo-oophorectomy in July 2003.  She had some postoperative  vault necrosis and partial ureteral obstruction which resolved.  The patient  continues to be under the care of Dr. Boston Service.   PAST MEDICAL HISTORY:  No medical illnesses.   PAST SURGICAL HISTORY:  TAH, BSO.   OBSTETRICAL HISTORY:  Gravida 1.   MEDICATIONS:  None.   SOCIAL HISTORY:  The patient is retired.  She smokes one to two packs per  day.   FAMILY HISTORY:  Negative for gynecologic, breast, or colon cancer.   REVIEW OF SYSTEMS:  Negative except as noted above.   PHYSICAL EXAMINATION:  VITAL SIGNS:  Weight 137 pounds (stable).  GENERAL:  The patient is a healthy white female who is in very good spirits.  HEENT:  Negative.  NECK:  Supple without thyromegaly.  There is no supraclavicular or  inguinal  adenopathy.  ABDOMEN:  Soft, nontender, no mass, organomegaly, ascites, or hernias are  noted.  PELVIC:  EGBUS, vagina, bladder, and urethra are normal.  There is some  radiation changes in the vagina, no lesions are noted.  It has good depth  and caliber.  Bimanual and rectovaginal exam reveal no masses, induration,  or nodularity.   IMPRESSION:  Stage IB2 squamous cell carcinoma of the cervix, May 2003, no  evidence of recurrent disease.   PLAN:  The patient will return to see [place] in six months and continue taking  her Effexor 37.5 mg a day, and Premarin 0.625 mg daily.  De Blanch, M.D.    DC/MEDQ  D:  05/18/2003  T:  05/18/2003  Job:  [id number]   cc:   Telford Nab, R.N.  [address]  [place], [place] [address]   Boston Service, M.D.  [address]  [place]  [place] [address]  Fax: [phone]   Billie Lade, M.D.  [address] [facility]  [place]  [place] [address]  Fax: [phone]   Sung Amabile. Roslyn Smiling, M.D.  [address]  [place]  [place] [address]  Fax: [phone]

## 2010-11-02 NOTE — Discharge Summary (Signed)
[facility]  Patient:    [name], [name] Visit Number: [id number] MRN: [id number]          Service Type: SUR Location: 2S 0275 01 Attending Physician:  Jeneen Montgomery Dictated by:   Billie Lade, M.D. Admit Date:  10/27/2001 Discharge Date: 10/29/2001   CC:         Reuel Boom L. Clarke-Pearson, M.D.  Aliene Altes, M.D.  Sung Amabile Roslyn Smiling, M.D.   Discharge Summary  DATE OF BIRTH:  19-Aug-1955  DIAGNOSIS:  Locally advanced squamous cell carcinoma of the cervix.  REASON FOR ADMISSION:  Intracavitary cesium-137 treatment.  DISCHARGE MEDICATIONS:  Vicodin one q.4-6h. p.r.n. pain.  The patient already has Compazine at home for nausea.  DISCHARGE INSTRUCTIONS:  The patient is to call if she should experience a temperature greater than 101.5 or experience severe abdominal pain or vaginal bleeding.  FOLLOWUP APPOINTMENT:  The patient is to see Dr. De Blanch on Nov 03, 2001, to determine if the patient will undergo an extrafascial hysterectomy or to receive with her second cesium intracavitary application.  NARRATIVE:  The patient is a 55 year old female who presented with vaginal bleeding.  She was found to have stage I B-II squamous cell carcinoma of the cervix with radiographically abnormal periaortic nodes.  She recently completed radiation treatment to the pelvis and periaortic node area totalling 4500 centigray.  She also received radiosensitizing cisplatinum-based chemotherapy at a reduced dose in light of her periaortic node radiation.  On the morning on Oct 27, 2001, the patient was taken to the operating room, and on examination under anesthesia, the patient was noted to have significant shrinkage of her large cervical mass.  Pretreatment, this measured approximately 8 x 9 cm in size, and on exam Oct 27, 2001, the lesion of her cervix measured approximately 4 x 5 cm in size.  The patient continued to have what appeared  significant involvement of the uterus with fibroids.  The patient had placement of a Fletcher-suit applicator in the lower uterus and proximal vagina.  After radiation planning at 4:45 p.m. on Oct 27, 2001, the patient had loading of her tandem with 20 mg, 10 mg cesium-137 source.  The ovoids were each loaded with 10 mg sources.  The patient subsequently had the cesium removed at 2:00 on Oct 29, 2001 without difficulty.  A radiation survey by Francena Hanly, certified medical dosimetrist showed no retained sources in the patient in her room or bed side. The patients Fletcher-suit applicator was subsequently removed without difficulty.  Later in the afternoon, the patient was discharged home, and will follow up with Dr. Stanford Breed as above.  When suming the dosage from the patients external beam radiation and her cesium application, the dose to point A thus far is approximately 7000 centigray. Dictated by:   Billie Lade, M.D. Attending Physician:  Jeneen Montgomery DD:  10/29/01 TD:  11/02/01 Job: [id number]

## 2010-11-02 NOTE — Consult Note (Signed)
NAME:  [name], [name]                           ACCOUNT NO.:  [id number]   MEDICAL RECORD NO.:  [id number]                   PATIENT TYPE:  OUT   LOCATION:  GYN                                  FACILITY:  [facility]   PHYSICIAN:  Daniel L. Clarke-Pearson, M.D.      DATE OF BIRTH:  02/25/1956   DATE OF CONSULTATION:  02/03/2002  DATE OF DISCHARGE:                                 GYN CONSULTATION   A 55 year old white female returns for interval assessment having felt  poorly over the past week to 10 days.  She claims she has had intermittent  constipation with diarrhea.  Constipation is treated with Fleet's enemas,  Senokot S, and Correctol.  She vomited yesterday (Tuesday) and the preceding  Saturday.  She notes a heaviness and fullness in the lower abdomen.  She  denies any fever or chills or any other GU symptoms.  She specifically  denies any flank pain or bladder pain or dysuria.   The patient ws recently readmitted to [facility] at which time it  was found that she had bilateral moderate to severe hydronephrosis which had  not been present preoperatively.  It was our interpretation at the time that  this was most likely secondary edema from surgical manipulation and  radiation therapy and it was likely it would resolve without a ureteral  stent.  Her renal function at that time was normal.  On the CAT scan there  was also suggestion of some air in the pelvic structures and a question of  pelvic cellulitis was raised and the patient was treated with intravenous  and then oral antibiotics.  She apparently completed her seven day course of  antibiotics without particular difficulty.   PAST SURGICAL HISTORY:  Modified radical hysterectomy and bilateral salpingo-  oophorectomy December 15, 2001.  At that time there was no residual viable tumor,  although patient did have endometriosis.   PHYSICAL EXAMINATION:  VITAL SIGNS:  Weight 127 pounds, blood pressure  112/86, temperature  99.8.  GENERAL:  The patient is a healthy white female who seems to be somewhat  anxious.  HEENT:  Negative.  NECK:  Supple without thyromegaly.  LYMPH:  There is no supraclavicular or inguinal adenopathy.  ABDOMEN:  Soft except for some thickening in the lower portion near the mons  or suprapubic region.  There are no masses, organomegaly, ascites, or  hernias noted.  She has a well healed Pfannenstiel incision.  PELVIC:  EGBUS normal.  Vagina is clean up to the apex where it is noted the  apex suture line is disrupted and there is a necrotic cavity at that area.  On bimanual examination the patient has extreme postoperative and radiation  fibrosis and induration.  No nodularity is noted.   IMPRESSION:  1. Cervical cancer status post radiation therapy followed by completion     hysterectomy.  No evidence of recurrent disease.  2. Radio necrosis of the upper  vagina and possible low grade pelvic     cellulitis and edema.  We will place the patient on Flagyl 500 mg t.i.d.     for 10 days and at the same time have her begin using half strength     peroxide douche b.i.d.   With regard to her other symptomatology, it is certainly possible that her  obstructed ureters could be leading to some of her poor feeling and  therefore we will refer her to a urologist for consideration and placement  of stents.  We will  assess renal function with a metabolic panel today and a CBC with  differential will be obtained to rule out any evidence of infection.  Following urological consultation I will have the patient return to see [place]  for continuing follow-up and evaluation.                                               Daniel L. Stanford Breed, M.D.    DLC/MEDQ  D:  02/03/2002  T:  02/03/2002  Job:  [id number]   cc:   Telford Nab, R.N.   [facility]  [address] Thayer Ohm, M.D.

## 2010-11-02 NOTE — Consult Note (Signed)
NAME:  [name], [name]                 ACCOUNT NO.:  [id number]   MEDICAL RECORD NO.:  [id number]          PATIENT TYPE:  OUT   LOCATION:  GYN                          FACILITY:  [facility]   PHYSICIAN:  Paola A. Duard Brady, MD    DATE OF BIRTH:  1955-07-13   DATE OF CONSULTATION:  07/03/2005  DATE OF DISCHARGE:                                   CONSULTATION   [name] is a very pleasant 55 year old who was diagnosed with a stage IB  II squamous cell carcinoma of the cervix.  She underwent external beam  radiotherapy followed by completion hysterectomy in July 2003.  Her  postoperative course was complicated by a vaginal vault necrosis, which was  managed conservatively and expectantly.  She was last seen by our service in  July 2006, at which time her exam and Pap smear were unremarkable.  She  comes in today for a follow-up.  She is overall doing quite well.  She  states that the Premarin vaginal cream she has been given causes her to have  UTI type symptoms.  Therefore, she has not been able to use it.  She denies  any bleeding, any change in her bowel or bladder habits,or any nausea,  vomiting, fevers, or chills.   HEALTH MAINTENANCE:  She had a mammogram in September 2006.   MEDICATIONS:  Effexor 37.5 mg daily.   SOCIAL HISTORY:  She smokes a pack per day.   PHYSICAL EXAMINATION:  VITAL SIGNS:  Weight 158 pounds, which is up 5 pounds  from her last visit.  Blood pressure 120/82.  GENERAL:  Well-nourished, well-developed female in no acute distress.  NECK:  Neck supple.  There is no lymphadenopathy.  No thyromegaly.  LUNGS:  Clear to auscultation bilaterally.  CARDIOVASCULAR:  Regular rate and rhythm.  ABDOMEN:  Well-healed surgical incision.  There is no evidence of any  incisional hernia.  Abdomen is soft, nontender, nondistended.  There are no  palpable masses or hepatosplenomegaly.  Groins are negative for adenopathy.  EXTREMITIES:  There is no edema.  PELVIC:  External genitalia  are within normal limits, though atrophic.  The  vagina is fibrotic at the upper portion.  There is no gross visible lesions.  ThinPrep Pap is done without difficulty.  Bimanual examination reveals  radiation fibrosis and thickening but no nodularity.  RECTAL:  Rectal confirms.  There is no stool in the vault for guaiac.   ASSESSMENT:  A 55 year old with a stage IB II squamous cell carcinoma of the  cervix with no evidence of recurrent disease.   PLAN:  1.  Follow up the results of her Pap smear from today.  2.  I refilled her Estrace cream and her Effexor.  3.  She will return to see [place] in 6 months or p.r.n.      Paola A. Duard Brady, MD  Electronically Signed     PAG/MEDQ  D:  07/03/2005  T:  07/03/2005  Job:  [id number]   cc:   [name], M.D.  Fax: [phone]   [name], M.D.  Fax:  [phone]   Telford Nab, R.N.  [address]  [place], [place] [address]

## 2010-11-02 NOTE — Consult Note (Signed)
NAME:  [name], [name]                           ACCOUNT NO.:  [id number]   MEDICAL RECORD NO.:  [id number]                   PATIENT TYPE:  OUT   LOCATION:  GYN                                  FACILITY:  [facility]   PHYSICIAN:  De Blanch, M.D.         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   INTERVAL HISTORY:  Since her last visit, she has done well.  She denies any  pelvic pain, pressure, or any other GI or GU symptoms.  She has no vaginal  bleeding.  She is still having difficulties with vasomotor symptoms.  She is  taking Premarin 0.625 mg daily.  When we had previously increased the dose  to 0.9 mg she became nauseated and had fluid retention.  More recently, she  has been on Effexor 50 mg, however, this makes her too sleepy during the  day.  She then broke the Effexor in half (25 mg), and this was ineffective.   HISTORY OF PRESENT ILLNESS:  The patient was found to have the squamous cell  carcinoma of the cervix initially treated with radiation therapy which was  completed in May 2003.  Because of inadequate central dosage, she underwent  a completion total abdominal hysterectomy, bilateral salpingo-oophorectomy  in July 2003.  Postoperative course was complicated by a vaginal vault  necrosis and partial ureteral obstruction.  This resolved under the care of  Dr. Boston Service.   Review of systems, family history, and social history are reviewed and  unchanged from previous notations.   PHYSICAL EXAMINATION:  VITAL SIGNS:  Weight 137 pounds, blood pressure  118/70.  GENERAL:  The patient is a healthy white female in no acute distress.  HEENT:  Negative.  NECK:  Supple without thyromegaly.  There is no  supraclavicular or inguinal  adenopathy.  ABDOMEN:  Soft, nontender, no mass, ascites, organomegaly, or hernias are  noted.  Her Pfannenstiel scar is well-healed.  PELVIC:  EGBUS, vagina, bladder, and urethra are normal.  Vagina has good  length and caliber. On bimanual exam there is a considerable amount of  radiation fibrosis without any nodularity or masses.  Rectovaginal  examination confirms.   IMPRESSION:  Stage IB2 squamous cell carcinoma of the cervix, status post  radiation therapy and completion hysterectomy.  The patient is clinically  free of disease.  Pap smears are obtained.   PLAN:  With regard to her hot flashes, she will continue taking Premarin  0.625 mg daily.  She is given a prescription for Effexor 37.5 mg to take  q.h.s.  She will take these for one month and contact us to indicate how she  is  doing.  She is scheduled to return to see [place] in four months for  continuing followup.                                                De Blanch, M.D.    DC/MEDQ  D:  01/11/2003  T:  01/11/2003  Job:  [id number]   cc:   Billie Lade, M.D.  [address] [facility]  [place]  [place]  [address]  Fax: [phone]   Boston Service, M.D.  [address]  [place]  [place] [address]  Fax: [phone]   Sung Amabile. Roslyn Smiling, M.D.  [address]  [place]  [place] [address]  Fax: [phone]   Telford Nab, R.N.  [phone] [address]  [place], [place] [address]

## 2010-11-02 NOTE — Consult Note (Signed)
NAME:  [name], [name] NO.:  [id number]   MEDICAL RECORD NO.:  [id number]                   PATIENT TYPE:   LOCATION:                                       FACILITY:   PHYSICIAN:  John T. [name], M.D.                 DATE OF BIRTH:   DATE OF CONSULTATION:  11/30/2001  DATE OF DISCHARGE:                                 GYN CONSULTATION   CHIEF COMPLAINT:  Stage IB-2 squamous cell carcinoma of the cervix post  chemoradiation for preoperative discussion of modified radical hysterectomy.   HISTORY OF PRESENT ILLNESS:  The patient was seen by Dr. Stanford Breed on  Nov 03, 2001, following completion of chemoradiation and an intracavitary  cesium application on Oct 27, 2001.  Although the patient had had an  excellent response, examination under anesthesia revealed that her cervix  was approximately 4 to 5 cm in diameter.  Given concern that she had  significant disease volume in the cervix, uterine enlargement from fibroids  and Dr. Trina Ao concerns that anatomy would be suboptimal for completion of  brachytherapy, she had been scheduled for a modified radical hysterectomy.  Unfortunately, due to family crisis, Dr. Stanford Breed was unable to  perform the surgery as scheduled and the procedure has been rescheduled for  December 15, 2001.   The patient is currently asymptomatic, without pelvic pain and with minimal  discharge.  Her past history, personal and social history, family history  and review of systems are reviewed and unchanged from those elicited by Dr.  Stanford Breed on Nov 03, 2001.   Examination is deferred.  Today's weight is 135 pounds with stable vital  signs; the patient is afebrile.   ASSESSMENT:  Stage IB-2 (bulky) cervical carcinoma post chemoradiation.  Uterine leiomyomata.   PLAN:  I had a discussion with the patient regarding risks and benefits of  completion hysterectomy.  Because of the perception of uterine expansion,  we  plan a modified radical hysterectomy and bilateral salpingo-oophorectomy.  Because of the prior chemoradiation and increased risk for ureteral  difficulties, in the face of a radical pelvic dissection, lymph nodes would  not be removed unless there was evidence of malignant lymphadenopathy.  I  discussed complications including GI and GU damage, hemorrhage,  thromboembolic complications, and lymphedema.  The patient's healing will be  compromised because of her prior chemoradiation and we discussed this.  Questions were answered and the patient wishes to proceed with surgery on  December 15, 2001.                                               John T. [name], M.D.    JTS/MEDQ  D:  02/10/2002  T:  02/10/2002  Job:  [id number]   cc:  Billie Lade, M.D.   Roseanna Rainbow, M.D.   Merilynn Finland, M.D.  [address] [facility]  [place]  [place] [address]  Fax: [phone]   Sung Amabile. Roslyn Smiling, M.D.   Telford Nab, R.N.

## 2010-11-02 NOTE — Discharge Summary (Signed)
NAME:  [name], [name]                           ACCOUNT NO.:  [id number]   MEDICAL RECORD NO.:  [id number]                   PATIENT TYPE:  INP   LOCATION:  0482                                 FACILITY:  [facility]   PHYSICIAN:  Anselm Pancoast. Zachery Dakins, M.D.          DATE OF BIRTH:  [birth year]   DATE OF ADMISSION:  10/21/2003  DATE OF DISCHARGE:  10/26/2003                                 DISCHARGE SUMMARY   Report inaudible - not transcribable.                                               Anselm Pancoast. Zachery Dakins, M.D.    WJW/MEDQ  D:  11/11/2003  T:  11/12/2003  Job:  [id number]

## 2010-11-02 NOTE — Op Note (Signed)
NAME:  [name], [name]                           ACCOUNT NO.:  [id number]   MEDICAL RECORD NO.:  [id number]                   PATIENT TYPE:  INP   LOCATION:  0482                                 FACILITY:  [facility]   PHYSICIAN:  John C. Madilyn Fireman, M.D.                 DATE OF BIRTH:  [birth year]   DATE OF PROCEDURE:  10/21/2003  DATE OF DISCHARGE:                                 OPERATIVE REPORT   PROCEDURE:   INDICATIONS FOR PROCEDURE:  Large gallstones, dilated common bile duct,  jaundice and suspected common bile duct stone on ultrasound.   DESCRIPTION OF PROCEDURE:  The patient was placed in the prone position and  placed on the pulse monitor with continuous low flow oxygen delivered by  nasal cannula.  She was sedated with __________ mg IV Versed and __________  mg IV fentanyl.  The Olympus side viewing endoscope was advanced blindly to  the oropharynx and esophagus and stomach which appeared grossly normal. The  pylorus was identified and traversed and the pillar of Vater located on the  medial duodenal wall that had a normal appearance.  It had a normal  appearance, it was cannulated with a Wilson-Cook sphincterotome.  The  pancreatic duct was injected on two occasions and appeared normal. With  repositioning, selective cannulation of the common bile duct was achieved  and a cholangiogram obtained. This appeared to show somewhat a dilated bile  duct especially proximal to the cystic duct with intrahepatic dilatation.  The distal duct appeared less than dilated and I did not see any filling  defects within the bile duct.  There was an area near the cystic duct that  did not fill well. Eventually the gallbladder filled and there were numerous  stones but the one large stone approximately 2 1/2 cm in diameter.  Sphincterotomy was performed and a balloon sweep performed 2 or 3 times with  difficulty in passing the balloon through an area near the cystic duct. It  was also not visualizing  well with dye.  The radiologist was in attendance  and assisted in interpretation of the film.  With more dye injected, there  appeared to be filling of the rim of the neck of the gallbladder with  another very large stone approximately 2 1/2 cm in diameter just adjacent to  the common bile duct.  With more dye it became apparent there was indeed a  very large stone in the neck of the gallbladder which was compressing the  adjacent common hepatic duct. This was consistent with Mirizzi syndrome.  Her ultrasound films were pulled and reviewed and seemed to correlate  perfectly with our interpretation of the cholangiogram films.  The scope was  then withdrawn and the patient returned to the recovery room in stable  condition.  She tolerated the procedure well and there were immediate  complications.   IMPRESSION:  1. Large gallstone  in the neck of the gallbladder compressing the common     bile duct.  2. Multiple large gallstones.   PLAN:  Will need cholecystectomy which may have to be done by open  laparotomy.  Will discuss with Dr. Zachery Dakins.                                               John C. Madilyn Fireman, M.D.    JCH/MEDQ  D:  10/21/2003  T:  10/21/2003  Job:  [id number]   cc:   Anselm Pancoast. Zachery Dakins, M.D.  [address]  [place]  [place] [address]  Fax: [phone]   Colon Flattery, D.O.  [address]  [place]  [place] [address]  Fax: [phone]

## 2010-11-02 NOTE — Consult Note (Signed)
[name], REEB NO.:  [id number]   MEDICAL RECORD NO.:  [id number]          PATIENT TYPE:  OUT   LOCATION:  GYN                          FACILITY:  [facility]   PHYSICIAN:  De Blanch, M.D.DATE OF BIRTH:  1956-04-28   DATE OF CONSULTATION:  07/03/2004  DATE OF DISCHARGE:                                   CONSULTATION   This 55 year old white female returns for continued follow up of stage IB-2  squamous cell carcinoma of the cervix.   INTERVAL HISTORY:  Since her last visit the patient has done well. She  denies any GI, GU, or GYN symptoms. She specifically  has no pelvic pain or  pressure, vaginal bleeding, or discharge. Her functional status is  excellent.   HISTORY OF PRESENT ILLNESS:  The patient is found to have a stage IB-2  squamous cell carcinoma of the cervix in May 2003. She received external  beam radiation therapy, but because of distorted anatomy, tandem application  was not felt to be safe and she underwent a completion hysterectomy in July  2003. She had some postoperative vault necrosis which is managed  conservatively and has resolved.   She had partial ureteral obstruction at the same time which has ultimately  resolved.   PAST MEDICAL HISTORY:  Medical illnesses: None.   PAST SURGICAL HISTORY:  TAH/BSO.   OBSTETRIC HISTORY:  Gravida 1.   CURRENT MEDICATIONS:  1.  Premarin 0.625 mg p.o. daily.  2.  Effexor 37.5 mg q.h.s.   SOCIAL HISTORY:  The patient is retired. She smokes one pack per day, but  she is currently trying to quit smoking.   REVIEW OF SYSTEMS:  Negative except as noted above.   PHYSICAL EXAMINATION:  VITAL SIGNS: Weight 149 pounds (up 14 pounds in six  months), blood pressure 120/82.  GENERAL: The patient is a healthy white female in no acute distress.  HEENT: Negative.  NECK: Supple without thyromegaly. There is no supraclavicular or inguinal  adenopathy.  ABDOMEN: Soft, nontender. No masses,  organomegaly, ascites, or hernias  noted.  PELVIC EXAM: EGBUS, vagina, bladder, and urethra are normal. No lesions are  noted. Pap smear is obtained. Vault is well supported. On bimanual exam  there is a considerable amount of radiation fibrosis without any nodularity  or masses. Rectovaginal exam confirms.   IMPRESSION:  Stage IB-2 squamous cell carcinoma of the cervix in 2003, no  evidence of recurrent disease.   PLAN:  Pap smear obtained. The patient will return to see [place] in six months  for continued followup.      DC/MEDQ  D:  07/03/2004  T:  07/03/2004  Job:  [id number]   cc:   Boston Service, M.D.  [address]  [place]  [place] [address]  Fax: [phone]   Billie Lade, M.D.  [address] - [facility]  [place]  [place] [address]  Fax: [phone]   Sung Amabile. Roslyn Smiling, M.D.  [address]  [place]  [place] [address]  Fax: [phone]   Telford Nab, R.N.  [phone]  [address]  [place], [place] [address]

## 2010-11-02 NOTE — Consult Note (Signed)
[name], [name] NO.:  [id number]   MEDICAL RECORD NO.:  [id number]          PATIENT TYPE:  OUT   LOCATION:  GYN                          [facility]ACILITY:  [facility]   PHYSICIAN:  De Blanch, M.D.DATE OF BIRTH:  08/22/1955   DATE OF CONSULTATION:  01/11/2005  DATE OF DISCHARGE:                                   CONSULTATION   CHIEF COMPLAINT:  Cervical cancer.   INTERVAL HISTORY:  Since her last visit, the patient has done well. She  denies any GI or GU symptoms. There is no pelvic pain, pressure, vaginal  bleeding, or discharge. She is tolerating her Premarin and Effexor well and  has no hot flashes.   HISTORY OF PRESENT ILLNESS:  The patient was treated with external beam  radiation therapy followed by completion hysterectomy in July 2003. She had  a stage Ib2 squamous cell carcinoma of the cervix. Postoperative course was  complicated by a vaginal vault necrosis which was managed conservatively.   PAST MEDICAL HISTORY:  Medical illnesses:  None.   PAST SURGICAL HISTORY:  TAH/BSO, cholecystectomy in May 2005.   OBSTETRICAL HISTORY:  Gravida 1.   CURRENT MEDICATIONS:  Premarin 0.625 mg daily, Effexor 37.5 mg q.h.s.   SOCIAL HISTORY:  The patient is retired. She smokes one pack per day.   FAMILY HISTORY:  Negative for gynecologic, breast, or colon cancer.   REVIEW OF SYSTEMS:  A 10-point comprehensive review of systems is negative  except for symptoms noted above.   PHYSICAL EXAMINATION:  VITAL SIGNS:  Weight 153 pounds, blood pressure  120/84.  GENERAL:  The patient is a pleasant, healthy young woman in no acute  distress.  HEENT:  Negative.  NECK:  Supple without thyromegaly.  LYMPH:  There is no supraclavicular or inguinal adenopathy.  ABDOMEN:  Soft, nontender. No mass, organomegaly, ascites, or hernias are  noted.  PELVIC:  EG/BUS, vagina, bladder, urethra are normal but atrophic. There is  some fibrosis at the upper half of the vagina. No  lesions are noted. Pap  smears are obtained. Bimanual and rectovaginal exam reveal radiation  fibrosis without any nodularity.   IMPRESSION:  Stage Ib2 squamous cell carcinoma of the cervix May 2003. No  evidence of recurrent disease. Pap smears are obtained today. She is given a  new prescription for Premarin, Effexor, and Premarin cream to be used 0.5 g  three times a week. She will return to see [place] in 6 months for continuing  follow-up.       DC/MEDQ  D:  01/11/2005  T:  01/11/2005  Job:  [id number]   cc:   Billie Lade, M.D.  [address] - [facility]  [place]  [place] [address]  Fax: [phone]   Sung Amabile. Roslyn Smiling, M.D.  [address]  [place]  [place] [address]  Fax: [phone]   Telford Nab, R.N.  [phone] [address]  [place], [place] [address]

## 2010-11-02 NOTE — Op Note (Signed)
[facility]  Patient:    [name], [name] Visit Number: [id number] MRN: [id number]          Service Type: SUR Location: 2S 0275 01 Attending Physician:  Jeneen Montgomery Dictated by:   Billie Lade, M.D. Admit Date:  10/27/2001   CC:         Reuel Boom L. Clarke-Pearson, M.D.  Sung Amabile Roslyn Smiling, M.D.  Aliene Altes, M.D.  Prudy Feeler, P.A.   Operative Report  PREOPERATIVE DIAGNOSIS:  Locally advanced cervical cancer with radiographic positive lymphadenopathy.  POSTOPERATIVE DIAGNOSIS:  Locally advanced cervical cancer with radiographic positive lymphadenopathy.  PROCEDURES: 1. Examination under anesthesia. 2. Placement of a Fletcher-Suit applicator in preparation for cesium 137    treatment under ultrasound guidance.  SURGEON:  Billie Lade, M.D.  DRAINS:  Foley catheter.  SPECIMEN:  None.  FINDINGS:  On examination under anesthesia, the patient was found to have significant shrinkage of her advanced cervical mass.  Pretreatment her cervical mass measured approximately 8 cm with extension into the vaginal cavity approximately halfway down.  On examination under anesthesia today, the cervical mass measured approximately 4 x 5 cm.  There was no obvious vaginal extension or parametrial involvement.  The vaginal fornices were somewhat obliterated secondary to the tumor mass.  The uterus was markedly enlarged and firm, which was most likely fibroids, however, tumor infiltration in addition could be a possibility.  DESCRIPTION OF PROCEDURE:  The patient was prepped and draped in the usual sterile fashion and placed in the dorsolithotomy position.  She then underwent examination under anesthesia as above.  The patient then had three gold seed markers placed at the cervical mass.  The cervical os was somewhat difficult to identify in light of the tumor destroying the normal anatomy.  The uterus was sounded under ultrasound guidance after  instillation of approximately 200-300 cc of sterile water in the bladder.  Dilation of the cervical os was quite difficult.  Despite number attempts, the uterus could not be completely sounded.  The uterine sounding equipment extended to at most halfway up into the uterine cavity despite significant pressure and several attempts. Ultrasound guidance did confirm good placement centrally within the uterine cavity on transverse and vaginal images.  The uterus was sounded in a limited manner to 4.5 cm.  The tandem was subsequently placed approximately 4 cm into the uterine cavity.  Two ovoids were then placed in the proximal vagina.  The vaginal cavity was packed with sterile 1-inch gauze soaked in Flagyl vaginal cream.  The labia majora was then sewn together with 0 Prolene using two sutures.  The patient was subsequently transported to the recovery room in stable condition.  She at a later date will undergo planning and eventual placement of her cesium sources within the treatment apparatus. Dictated by:   Billie Lade, M.D. Attending Physician:  Jeneen Montgomery DD:  10/27/01 TD:  10/28/01 Job: [id number]

## 2010-11-02 NOTE — H&P (Signed)
NAME:  [name], [name]                           ACCOUNT NO.:  [id number]   MEDICAL RECORD NO.:  [id number]                   PATIENT TYPE:  INP   LOCATION:  0482                                 FACILITY:  [facility]   PHYSICIAN:  Anselm Pancoast. Zachery Dakins, M.D.          DATE OF BIRTH:  1956/02/12   DATE OF ADMISSION:  10/21/2003  DATE OF DISCHARGE:                                HISTORY & PHYSICAL   CHIEF COMPLAINT:  Jaundice.   HISTORY OF PRESENT ILLNESS:  [name] is a 55 year old Caucasian female  who was seen at the [facility] in [place] the day before  yesterday and complained of kind of vague upper abdominal pain. Whether she  was noted to be jaundiced or not, it was not clear, but this morning they  called and her bilirubin was 8. They had called for her to be seen in the  office a couple of weeks, but it was obvious she needed to come to the  emergency room. The patient has had known gallstones. She had a cervical  malignancy approximately three or four years ago. On CT for evaluation of  that it was noted that she had gallstones. She has kind of vague episodes of  epigastric discomfort, gas some times associated with eating, but not really  the typical bladder attacks. She came to the emergency room and on  appearance she was obviously jaundiced, not complaining of basically acute  abdominal pain and on palpation of her abdomen she was vaguely tender, but  certainly not that of a subacute cholecystitis or acute cholecystitis. Her  laboratory studies that were done yesterday showed a white count of 5500,  hematocrit 34.8, total bilirubin 8.6 with an alkaline phosphatase of 1215.  SGOT and SGPT 172 and 245. I talked with Dr. Madilyn Fireman, gastroenterologist,  about the ERCP and he recommended that he could go ahead and do the ERCP  right at that time and this showed that this was not a stone in the distal  common bile duct, but kind of a Mirizzi syndrome. There was a stone in  the  proximal gallbladder that was kind of eroded into the cystic duct or  gallbladder splayed area.  He did a little sphincterotomy and pulled a few  balloons through the distal common bile duct, but could not actually see a  stone there.  I saw her afterwards and she was certainly not acutely tender  and added her to the OR scheduled for the following day.   REVIEW OF SYSTEMS:  PULMONARY:  I do not she smokes. NEUROLOGIC: No  neurologic problems.  HEMATOLOGIC: Negative. GI: Generally has vague pains.  Husband says she complains of more than the patient admitted to and acute  episodes.   She did have a hysterectomy with radiation therapy for cancer of the cervix  in 2002. She is without evidence of further problems along these lines.   She  denies any allergies.   Her present medications include Effexor.   PHYSICAL EXAMINATION:  VITAL SIGNS: Blood pressure 120/78, temperature 99,  pulse 72, respirations 16.  She is 5 feet 1 inches, weighs 140 pounds.  GENERAL: She is obviously jaundiced, but quite talkative, with no acute  problems.  HEENT: She is icteric, but appears well hydrated.  NECK: No cervical or supraclavicular lymphadenopathy. I do not appreciate  any bruits.  BREASTS:  Normal breast exam.  LUNGS: Normal breath sounds bilaterally.  CARDIAC: Normal sinus rhythm.  ABDOMEN: She is kind of vaguely tender in the upper abdomen. There is lower  abdominal tenderness. Her stools are light in color.  EXTREMITIES: No pedal edema.  CNS: Physiologic.   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   PLAN:  The patient will have liquids tonight, post ERCP, and amylase checked  in the morning. She is doing fine. We will attempt a laparoscopic  cholecystectomy in the a.m. I discussed with the patient that this may  require an open cholecystectomy since this is not the  usual typical  gallbladder disease, and I personally have never seen a bilirubin of 8 with  the inflammation kind of distending the gallbladder itself.                                               Anselm Pancoast. Zachery Dakins, M.D.    WJW/MEDQ  D:  10/22/2003  T:  10/22/2003  Job:  [id number]

## 2010-11-02 NOTE — Consult Note (Signed)
[facility]  Patient:    [name], [name] Visit Number: [id number] MRN: [id number]          Service Type: GON Location: GYN Attending Physician:  Jeannette Corpus Dictated by:   Rande Brunt. Clarke-Pearson, M.D. Proc. Date: 08/25/01 Admit Date:  08/25/2001   CC:         Sung Amabile. Roslyn Smiling, M.D.  Prudy Feeler, P.A.  Telford Nab, R.N.   Consultation Report  NEW PATIENT CONSULTATION  IDENTIFICATION:  A 55 year old white married female referred for consultation from Sung Amabile. Roslyn Smiling, M.D.  HISTORY OF PRESENT ILLNESS:  The patient presented to Dr. Roslyn Smiling with a foul-smelling vaginal discharge and some spotting and was found to have a large lesion of the cervix.  Pap smear was obtained showing a high-grade SIL lesion.  The patient reports that she has had not had a Pap smear in approximately two years.  Previously she claims that her Pap smears were normal.  She notes pelvic pressure, urinary frequency, and back pain.  She has also lost about 10 pounds of weight in the past year.  PAST MEDICAL HISTORY:  Medical illnesses:  None.  PAST SURGICAL HISTORY:  None.  OBSTETRICAL HISTORY:  Gravida 1.  GYNECOLOGIC HISTORY:  See history of present illness.  Last menstrual period is approximately one month ago.  MEDICATIONS:  None.  FAMILY HISTORY:  Negative for gynecologic, breast, or colon cancer.  SOCIAL HISTORY:  The patient is recently retired.  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  She smokes one to two packs per day.  REVIEW OF SYSTEMS:  Negative except as noted above in the history of present illness.  PHYSICAL EXAMINATION:  VITAL SIGNS:  Weight 139 pounds, height 5 feet 1 inch, blood pressure 118/86, pulse 100, respiratory rate 18.  GENERAL:  The patient is a healthy white female in no acute distress.  HEENT:  Negative.  NECK:  Supple without thyromegaly.  LYMPHATIC:  There was no supraclavicular or  inguinal adenopathy.  ABDOMEN:  Soft and nontender.  She has a palpable mass that extends approximately 3 cm beneath the umbilicus, and this is nontender.  No other masses, organomegaly, or ascites are noted.  EXTREMITIES:  Lower extremities are without edema or varicosities.  PELVIC:  EG, BUS is normal.  The vagina seems normal.  At the apex of the vagina is an enlarged cervix measuring approximately 8 cm in diameter.  This seems to be an exophytic lesion, and I am unable to visualize all of the vagina although on palpation it does not feel as if the vagina is involved.  Bimanual and rectovaginal exam confirm.  I do not feel any parametrial or uterosacral ligament involvement.  PROCEDURE NOTE:  A cervical biopsy is obtained from a representative area at the edge of the exophytic tumor.  Hemostasis achieved with Monsels solution.  IMPRESSION:  Stage IB2 (8 cm) cervical carcinoma based on clinical exam.  We will await the biopsy reports to confirm histology.  In the interim I believe she would benefit by having a CT scan of the abdomen and pelvis to identify as to whether there is any enlarged adenopathy, obstructed ureters, or distant metastases.  I do not think the patient is a surgical candidate given the huge size of her cervix and therefore would recommend that she be treated with radiation therapy and concurrent weekly cisplatin chemotherapy.  I will make a referral to radiation oncology as soon as her CT  scan report is back. Dictated by:   Rande Brunt. Clarke-Pearson, M.D. Attending Physician:  Jeannette Corpus DD:  08/25/01 TD:  08/26/01 Job: [id number]

## 2010-11-02 NOTE — Discharge Summary (Signed)
NAME:  [name], [name]                           ACCOUNT NO.:  [id number]   MEDICAL RECORD NO.:  [id number]                   PATIENT TYPE:  INP   LOCATION:  0482                                 FACILITY:  [facility]   PHYSICIAN:  Anselm Pancoast. Zachery Dakins, M.D.          DATE OF BIRTH:  1955-09-22   DATE OF ADMISSION:  10/21/2003  DATE OF DISCHARGE:  10/26/2003                                 DISCHARGE SUMMARY   DISCHARGE DIAGNOSES:  Subacute cholecystitis with erosions, gallstones  common hepatic duct previously __________ .   OPERATION:  1. __________ .  2. __________ .   HISTORY:  The patient is a __________   __________ was turning yellow.  She saw Dr. Karmen Stabs PA and laboratory  studies were ordered.  She was given medication for the itching and the  following morning the bilirubin was noted to be 8.  Her SGOT, SGPT and  alkaline phosphatase were also noted.  They called [place] and I saw her in the  emergency room.  Her laboratory studies showed a bilirubin of 8.2.  Her  hematocrit was 27.  I obtained an ultrasound which showed not really a  dilated distal common bile duct, but could see dilatation of the  intrahepatic radicles.  I talked with Dr. Madilyn Fireman and asked him to do a  preoperative ERCP.  This was performed on the day of admission with the  findings of large gallstone, one impacted in the neck of the gallbladder,  and on the interpretation by the radiologist, they were not sure that they  could ever see the cystic duct, but there was definitely dye going around  this big stone.  There was no stone in the distal common bile duct.   The patient was added to the OR schedule for the following morning, and Dr.  Jamey Ripa was the assistant.  At the time of surgery, we opened with the  laparoscope a significant kind of subacute gallbladder with a lot of chronic  adhesions and then started carefully dissecting down the anterior surface of  the gallbladder trying to identify any structures,  then basically  encountered this large stone.  We knew then it would be necessary to  definitely open, and then upon opening the gallbladder, it was recognized  that really the stone had eroded through the wall of the gallbladder and the  common hepatic duct, known as a cholecystic ductal cyst.  We removed the  gallbladder and then identified the proximal and distal end of the common  bile duct.  They, of course, were right together and had not been divided.  Then, placed a #16 __________ Rachelle Hora tube and then reconstructed the common  bile duct around this T-tube.  A Blake drain was placed at the bed of the  gallbladder fossa, and postoperatively she did well.   We continued her on the antibiotics, never had any bile drainage from the  drain within  the bed of the gallbladder.  She was discharged on no  antibiotics but instructed to record the drainage of the JP drainage and  also the T-tube bile drainage, and we will see her in the office in  approximately four or five days.   I hope that the drainage from the drain site will basically stop.  We will  then clamp the T-tube and if there is no drainage, remove this and get a T-  tube cholangiogram.  Since this common hepatic duct was reconstructed, I  think she will need this T-tube for at least 6 to 8 weeks before it is  removed.  The incidence of stricture in this setting is moderately elevated,  and the patient and her husband are aware of that.   The patient is discharged in an improved condition.  Understands that if she  has fever or pain she should call promptly.  Otherwise, she will be seen in  the office in approximately three to four days.  She was given Vicodin for  pain and arrangements for followup care have been made.                                               Anselm Pancoast. Zachery Dakins, M.D.    WJW/MEDQ  D:  11/11/2003  T:  11/12/2003  Job:  [id number]   cc:   Colon Flattery, D.O.  [address]  [place]  [place] [address]   Fax: [phone]

## 2010-11-02 NOTE — Consult Note (Signed)
NAME:  [name], [name]                           ACCOUNT NO.:  [id number]   MEDICAL RECORD NO.:  [id number]                   PATIENT TYPE:  OUT   LOCATION:  GYN                                  FACILITY:  [facility]   PHYSICIAN:  De Blanch, M.D.         DATE OF BIRTH:  06-28-1955   DATE OF CONSULTATION:  05/19/2002  DATE OF DISCHARGE:                                   CONSULTATION   REASON FOR CONSULTATION:  The patient is a 55 year old white female returns  for continuing followup.  She had a bulky stage IB2 squamous cell carcinoma  of the cervix.  She had radiation therapy and having a completion  hysterectomy on 12/15/01.  Postoperative course was complicated by partial  ureteral obstruction and vaginal vault necrosis.   Since her last visit, she has seen Dr. Boston Service, who has repeated a  Lasik renal scan showing that there is resolution of the hydronephrosis on  the right, and improvement on the left.  Repeat ultrasounds are scheduled in  approximately two months.  She denies any urinary tract symptoms.   She has no GI symptoms except for occasional constipation.  She has been  using peroxide douches for vaginal vault necrosis, and notes there is  considerably less drainage and no vaginal bleeding or pain.  She does have  edema over her mons which is somewhat bothersome.  She does not have any  lymphedema in the legs.   REVIEW OF SYMPTOMS:  Otherwise negative.   FAMILY HISTORY:  Reviewed and unchanged.   SOCIAL HISTORY:  Reviewed and unchanged.   PHYSICAL EXAMINATION:  VITAL SIGNS:  Weight 132.5 pounds, blood pressure  150/100.  GENERAL:  The patient is a healthy white female in no acute distress.  HEENT:  Negative.  NECK:  Supple without thyromegaly.  There is no supraclavicular or inguinal  adenopathy.  ABDOMEN:  Soft, nontender, no masses, organomegaly, ascites, or hernias are  noted.  Her Pfannenstiel incision is well-healed  PELVIC:  EGBUS shows a  considerable amount of edema over the mons and upper  vulva.  The vagina is clean.  The apex is still slightly necrotic, but is a  very small area.  There is good caliber in length to the vagina.  Bimanual  and rectovaginal examination reveal radiation fibrosis without any  nodularity.   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  No evidence of recurrent disease.  Her  Pap smear at last visit was normal.   Hydronephrosis which is improving, most likely secondary to edema from  surgery and radiation therapy.  The patient will continue under the care of  Dr. Boston Service for management of this issue.   She is given a prescription for Aldactazide 25 mg q.d. to be used p.r.n. for  swelling over her mons.  It is also suggested she wear a pantry girdle  in  order to increase the amount of pressure on this area, and hopefully drive  the interstitial fluid back into lymph channels.   She will return to see me in three months for continuing surveillance and  repeat a Pap smear at that time.                                               De Blanch, M.D.    DC/MEDQ  D:  05/19/2002  T:  05/19/2002  Job:  [id number]   cc:   Boston Service, M.D.  [address]  [place]  [place] [address]  Fax: [phone]   Telford Nab, R.N.  [address] [place], [place] [address]  Fax: 1   Billie Lade, M.D.  [address] - [facility]  [place]  [place]  [address]  Fax: [phone]   J. Aliene Altes, M.D.  [address] [place] - [facility]  [place]  [place] [address]  Fax: [phone]   Crissie Reese, M.D.

## 2010-11-02 NOTE — Discharge Summary (Signed)
[facility]  Patient:    [name], BASICH Visit Number: [id number] MRN: [id number]          Service Type: GYN Location: 4W 0473 01 Attending Physician:  Antionette Char Dictated by:   Roseanna Rainbow, M.D. Admit Date:  12/15/2001 Discharge Date: 12/19/2001   CC:         GYN/oncology office, [facility]  [facility] [address]   Discharge Summary  HISTORY:  The patient is a 55 year old with stage Ib squamous cell carcinoma of the cervix, status post radiation therapy, who presents for modified radical hysterectomy.  Please see the dictated history and physical, as per Dr. Reuel Boom L. Clarke-Pearson for further details.  HOSPITAL COURSE:  The patient was admitted, underwent a modified radical hysterectomy and bilateral salpingo-oophorectomy.  Please see the dictated operative summary, as per Dr. Ronita Hipps, for further details.  Her postoperative course was remarkable for anemia secondary to blood loss with a hemoglobin nadir in the 7 range.  However, her preoperative hemoglobin was in the 9 range.  She had delayed return of bowel function.  She had a T-max of 101.6 on postoperative day #2.  Thereafter, she defervesced spontaneously and remained afebrile until the day of discharge.  Prior to discharge, she had had several bowel movements and was tolerating a regular diet.  She also had mild tachycardia that normalized prior to discharge as well.  DISCHARGE DIAGNOSES: 1. Stage Ib cervical cancer. 2. Uterine fibroids. 3. Endometriosis.  PROCEDURE:  Modified radical hysterectomy, bilateral salpingo-oophorectomy.  CONDITION:  Good.  DIET:  Regular.  ACTIVITY:  As per the instruction booklet.  DISCHARGE MEDICATIONS: 1. Vicodin ES 1 tab p.o. q.i.d. p.r.n. 2. Ferrous sulfate 325 mg p.o. b.i.d. 3. Colace 1 tab p.o. q.d. p.r.n.  DISPOSITION:  The patient was to follow up with Dr. Stanford Breed on  Tuesday, December 29, 2001, at noon and the GYN/oncology office at the [facility]. Dictated by:   Roseanna Rainbow, M.D. Attending Physician:  Antionette Char DD:  12/19/01 TD:  12/22/01 Job: [id number]

## 2010-11-02 NOTE — Consult Note (Signed)
NAME:  [name], [name]                           ACCOUNT NO.:  [id number]   MEDICAL RECORD NO.:  [id number]                   PATIENT TYPE:  OUT   LOCATION:  GYN                                  FACILITY:  [facility]   PHYSICIAN:  De Blanch, M.D.         DATE OF BIRTH:  09-18-1955   DATE OF CONSULTATION:  09/14/2002  DATE OF DISCHARGE:                                   CONSULTATION   REASON FOR CONSULTATION:  The patient returns today for continuing follow-up  of stage IB 2 squamous cell carcinoma of the cervix.  She was initially  treated with radiation therapy and had a complete hysterectomy in July of  2003.  Postoperative course was complicated by vaginal vault necrosis and  partial ureteral obstruction.  She remains under the care of Boston Service, M.D.  It is my understanding from the patient that Boston Service, M.D. felt that her hydronephrosis was further improved on a  recent visit and has scheduled her to return in six months for follow-up.   The patient's only significant complaint is that she is having hot flushes.  She denies any other GI or GU symptoms.  She denies any pelvic pain,  pressure, vaginal bleeding, or discharge.  She does have some swelling in  the mons region and believes that she has swelling in her left lower  extremity when she uses ibuprofen.   Review of systems, family history, and social history are all reviewed and  unchanged from previous notations.   PHYSICAL EXAMINATION:  VITAL SIGNS:  Weight 131 pounds, blood pressure  128/78.  GENERAL:  The patient is a healthy white female in no acute distress.  HEENT:  Negative.  NECK:  Supple without thyromegaly.  LYMPH:  There is no supraclavicular or inguinal adenopathy.  ABDOMEN:  Soft, nontender.  No masses, organomegaly, ascites, or hernias are  noted.  PELVIC:  EGBUS, vagina, bladder, urethra are normal, although there is  considerable amount of induration on palpation throughout  the pelvis  consistent with radiation effect.  Rectovaginal examination confirms and  again there is fixation of the rectum at approximately 10-12 cm consistent  with radiation fibrosis.  Pap smears are repeated.   IMPRESSION AND PLAN:  Stage IB 2 squamous cell carcinoma of the cervix.  No  evidence for recurrent disease.  The patient continues to recover from her  radiation and surgery.   She will return to see Boston Service, M.D. as scheduled.  We will have  her return to see [place] in four months for continuing follow-up.   With regard to her hot flushes she is given a prescription for Premarin  0.625 mg daily.   She will contact us if she is having continuing difficulty and we certainly  could increase the dose as needed.  De Blanch, M.D.    DC/MEDQ  D:  09/14/2002  T:  09/14/2002  Job:  [id number]   cc:   Billie Lade, M.D.  [address] - [facility]  [place]  [place]  [address]  Fax: [phone]   Boston Service, M.D.  [address]  [place]  [place] [address]  Fax: [phone]   Telford Nab, R.N.   Merilynn Finland, M.D.  [address] [place] - [facility]  [place]  [place] [address]  Fax: [phone]   Sung Amabile. Roslyn Smiling, M.D.  [address]  [place]  [place] [address]  Fax: [phone]

## 2010-11-02 NOTE — Consult Note (Signed)
NAME:  [name], [name]                           ACCOUNT NO.:  [id number]   MEDICAL RECORD NO.:  [id number]                   PATIENT TYPE:  OUT   LOCATION:  GYN                                  FACILITY:  [facility]   PHYSICIAN:  John C. Madilyn Fireman, M.D.                 DATE OF BIRTH:  1955-06-24   DATE OF CONSULTATION:  10/21/2003  DATE OF DISCHARGE:                                   CONSULTATION   REASON FOR CONSULTATION:  Suspected common bile duct stones.   HISTORY OF PRESENT ILLNESS:  The patient is a 55 year old white female who  presented to Dr. Karmen Stabs office with complaint of itching and jaundice  yesterday and was found to have a bilirubin of 8 with elevated SGOT, SGPT  and alkaline phosphatase. She has known gallstones that were discovered in  2003 in which she had a CT scan and was worked up for cervical cancer. She  underwent radiation treatments and an eventual hysterectomy.  She has not  had any abdominal pain, nausea, or vomiting or any fever or chills.  She is  otherwise completely asymptomatic. She was referred here and had an  ultrasound which does show numerous stones and dilatation of the proximal  common bile duct and ability to visualize the common bile duct.   PAST MEDICAL HISTORY:  Cervical cancer.   PAST SURGICAL HISTORY:  Hysterectomy.   MEDICATIONS:  Effexor and Premarin.   SOCIAL HISTORY:  The patient is married, she has one daughter and she smokes  one pack of cigarettes a day, denies alcohol use.   FAMILY HISTORY:  Negative for GI malignancy. Father had diabetes.   ALLERGIES:  OXYCODONE.   PHYSICAL EXAMINATION:  GENERAL:  Well-developed, well-nourished, moderately  jaundiced white female alert and oriented in no acute distress.  HEART:  Regular rate and rhythm without murmur.  LUNGS:  Clear.  ABDOMEN:  Soft, nontender, with normoactive bowel sounds. No  hepatosplenomegaly, mass or guarding.   LABORATORY DATA:  Pending except for amylase 32, prothrombin  time 13.   IMPRESSION:  Likely obstructing common bile duct stone in patient with  jaundice and known cholelithiasis.   PLAN:  Will proceed with ERCP after a dose of Ancef.  Risks, rational,  limitations and alternatives were explained to the patient and she wishes to  proceed. Dr. Eather Colas  has also discussed strategy with her as well and we  will probably proceed with cholecystectomy tomorrow.                                               John C. Madilyn Fireman, M.D.    JCH/MEDQ  D:  10/21/2003  T:  10/21/2003  Job:  [id number]   cc:   Leona Singleton, M.D.  [address]  [place]  [place] [address]  Fax: [phone]   Anselm Pancoast. Zachery Dakins, M.D.  [address]  [place]  [place] [address]  Fax: [phone]

## 2010-11-02 NOTE — Consult Note (Signed)
[name], [name] NO.:  [id number]   MEDICAL RECORD NO.:  [id number]          PATIENT TYPE:  OUT   LOCATION:  GYN                          FACILITY:  [facility]   PHYSICIAN:  De Blanch, M.D.DATE OF BIRTH:  27-Jan-1956   DATE OF CONSULTATION:  DATE OF DISCHARGE:                                 CONSULTATION   CHIEF COMPLAINT:  Cervical cancer.   INTERVAL HISTORY:  Since her last visit, the patient has done well.  She  denies any GI or GU symptoms.  She has no pelvic pain, pressure, vaginal  bleeding, or discharge.  Functional status is excellent.   HISTORY OF PRESENT ILLNESS:  Patient had stage IB, grade 2 squamous cell  carcinoma of the cervix in 2003.  She was treated with external beam  radiation therapy.  Because of anatomic concerns, a completion total  abdominal hysterectomy was performed in July, 2003.  Postoperative  course was complicated by vaginal vault necrosis, which has resolved  with conservative management.  Patient does have chronic bilateral  hydronephrosis followed by Dr. Boston Service.   PAST MEDICAL HISTORY/MEDICAL ILLNESSES:  None.   PAST SURGICAL HISTORY:  TAH/BSO, cholecystectomy.   OBSTETRICAL HISTORY:  Gravida 1.   CURRENT MEDICATIONS:  1. Premarin 0.625 mg daily.  2. Effexor 37.5 mg nightly.   SOCIAL HISTORY:  The patient is retired.  She smokes one pack per day.   FAMILY HISTORY:  Negative for gynecologic, breast, or colon cancer.   REVIEW OF SYSTEMS:  A 10-point comprehensive review of systems is  negative, except as noted above.   PHYSICAL EXAMINATION:  VITAL SIGNS:  Weight 156 pounds.  Blood pressure  120/70, pulse 80, respiratory rate 20.  GENERAL:  Patient is a healthy white female in no acute distress.  HEENT:  Negative.  NECK:  Supple without thyromegaly.  There is no supraclavicular or  inguinal adenopathy.  ABDOMEN:  Soft and nontender.  No mass, organomegaly, ascites, or  hernias are noted.  PELVIC:  EG/BUS, vagina, bladder, and urethra are normal.  On bimanual  examination, there is a considerable amount of radiation fibrosis  throughout the pelvis and paravaginal tissues.  No lesions are noted.  Pap smears are obtained.  Rectovaginal exam confirms.  EXTREMITIES:  Lower extremities are without edema and varicosities.   IMPRESSION:  Stage IB, grade 2 squamous cell carcinoma of the cervix in  July, 2003.  No evidence for recurrent disease.   PLAN:  Pap smears are obtained today.  The patient will return to see  Dr. Roselind Messier in six months.  Return to see [place] in one year.  She is given a  prescription for Effexor 37.5 mg nightly and will continue taking  Premarin as previously prescribed.      De Blanch, M.D.  Electronically Signed     DC/MEDQ  D:  07/02/2006  T:  07/02/2006  Job:  [id number]   cc:   Telford Nab, R.N.  [address]  [place], [place] [address]   Billie Lade, M.D.  Fax: [phone]   Aniceto Boss   [facility]  Wanda Plump, M.D.  Fax: [phone]

## 2010-11-02 NOTE — Op Note (Signed)
NAME:  LOAN, OGUIN                           ACCOUNT NO.:  [id number]   MEDICAL RECORD NO.:  [id number]                   PATIENT TYPE:  INP   LOCATION:  0482                                 FACILITY:  [facility]   PHYSICIAN:  Anselm Pancoast. Zachery Dakins, M.D.          DATE OF BIRTH:  [birth year]   DATE OF PROCEDURE:  10/22/2003  DATE OF DISCHARGE:                                 OPERATIVE REPORT   PREOPERATIVE DIAGNOSIS:  Chronic cholecystitis with obstruction of the mid  common bile duct secondary to impacted stone within the gallbladder.   POSTOPERATIVE DIAGNOSIS:  Subacute cholecystitis, studded, eroded to the mid  common hepatic duct.   OPERATION/PROCEDURE:  Attempted laparoscopic cholecystectomy converted to  open cholecystectomy with cholangiogram.  T-tube repair of the common hepatic duct.   SURGEON:  Anselm Pancoast. Zachery Dakins, M.D.   ASSISTANT:  Sharlet Salina T. Hoxworth, M.D.   ANESTHESIA:  General.   HISTORY:  [name] is a 55 year old female who has had known gallstones  for about three or four years.  She has had vague episodes of epigastric  gaseous pain intermittently and on Tuesday had an episode of pain which she  describes as kind of chronic right shoulder symptoms and then noticed that  she was jaundiced and saw physician on Thursday.  They called yesterday.  Her bilirubin was 8 and she had an ERCP by Dr. Dorena Cookey yesterday that  showed a stone impacted in the common hepatic duct and whether it was  entered through the inflamed gallbladder or whether it was through the  cystic duct could not be sure on the cholangiogram that was performed.  I  recommended we proceed on with a laparoscopic surgery.  I discussed with the  patient that we may have to do an open cholecystectomy.  We placed her on 3  g of Unasyn __________ .   DESCRIPTION OF PROCEDURE:  The patient was taken to the operating suite.  Induction of general anesthesia, endotracheal tube, oral tube to the  stomach.   The abdomen was prepped with Betadine solution and draped in a  sterile manner.   A small incision was made below the umbilicus.  The fascia was identified  and we carefully entered into the peritoneal cavity.  She had marked  inflammation around the gallbladder.  Kind of chronic thickening in the  duodenum and distal stomach was not adherent up to this chronically  thickened gallbladder.  The upper 10 mm trocar was placed under direct  vision and the two lateral 5 mm trocars were placed __________ .  We first  sort of peel it and opened up the inflammatory adhesions around the  gallbladder.  We could see just the tip of the gallbladder and fairly  shortly switched to the 30-degree scope to aid in visualization.  We could  free up the lateral portion of the gallbladder but the area medially feels  packed with stones.  We continued very carefully, dissecting down and then  dissected into an area that we could see this obviously large stone and went  ahead and placed the stone, which was probably about 2-3 cm in size in an  EndoCatch bag.  At this point, it was apparent that we were not going to be  able to remove the gallbladder  We thought we could see a little less bile  coming up more proximally as if it was probably the common bile duct and we  converted to an open cholecystectomy at this point.   A right subcostal incision was made.  We removed the trocars and entered the  peritoneal cavity.  There was a moderate amount serous fluid removed.  The  gallbladder had been mainly freed laterally and in the proximal area, we  carefully dissected the gallbladder down in a retrograde pattern.  I marked  directly on the gallbladder and after we got this area freed from the  undersurface of the liver, we could then see that there was an area where  the common hepatic duct was exposed.  We could see the area going to the  duodenum and the area going over to the intrahepatic __________ .  We   confirmed this by placing a Reddick catheter in each of the C-arm areas.  The duct itself was not really transected.  It was just the anterior portion  that we thought that possibly this could be repaired over a T-tube and with  long-term drainage not have to do a Roux-Y, and I elected to proceed along  those lines.  The existing duct had been separated and this was ligated with  a 2-0 Vicryl.  The cystic artery also had been identified and sutured with a  3-0 silk and then we placed a 16 __________  T-tube within the proximal and  distal ends and then brought the mucosa together with three sutures of 4-0  Vicryl.  An 24 Blake drain was placed up through the foramen of Winslow and  a T-tube cholangiogram then obtained which showed good flow into the  duodenum.  I think that this would be better than trying to proceed with a  Roux-Y at this time even though she may stricture down.  The duodenum and  distal stomach where this horrible inflammatory process occurred was  inspected and we could not see any evidence of any gastric duodenal  contents.  This was normal and then the T-tube had been brought out  laterally and the [place] drain just under it.  The fascia was closed with 0  PDS in two layers.  I did use some interrupted 0 Prolene in the anterior  rectus fascia as she has kind of a thin layer.  The subcutaneous wounds were  irrigated.  Skin closed with staples and the tubes all had been securely  fastened to the skin since she will need the T-tube in sometime.  The  procedure was tolerated nicely.   We are planning to not feed her but I do not think she will need a  nasogastric tube.  We will keep her on Unasyn for at least 48 hours.  T-tube  will be left open.  We will use PCA morphine for postoperative pain control.  Estimated blood loss was probably about 100 mL.  Sponge and needle counts  were correct x2.  Anselm Pancoast. Zachery Dakins,  M.D.    WJW/MEDQ  D:  10/22/2003  T:  10/22/2003  Job:  [id number]   cc:   Everardo All. Madilyn Fireman, M.D.  [address]  [place]  [place] [address]  Fax: [phone]

## 2010-12-21 ENCOUNTER — Other Ambulatory Visit: Payer: Self-pay | Admitting: Gynecology

## 2010-12-21 ENCOUNTER — Other Ambulatory Visit (HOSPITAL_COMMUNITY)
Admission: RE | Admit: 2010-12-21 | Discharge: 2010-12-21 | Disposition: A | Discharge status: Home / Self Care | Payer: PRIVATE HEALTH INSURANCE | Source: Ambulatory Visit | Attending: Gynecology | Admitting: Gynecology

## 2010-12-21 ENCOUNTER — Ambulatory Visit: Payer: PRIVATE HEALTH INSURANCE | Attending: Gynecology | Admitting: Gynecology

## 2010-12-21 DIAGNOSIS — Z90710 Acquired absence of both cervix and uterus: Secondary | ICD-10-CM | POA: Insufficient documentation

## 2010-12-21 DIAGNOSIS — N1330 Unspecified hydronephrosis: Secondary | ICD-10-CM | POA: Insufficient documentation

## 2010-12-21 DIAGNOSIS — Z8540 Personal history of malignant neoplasm of unspecified female genital organ: Secondary | ICD-10-CM | POA: Insufficient documentation

## 2010-12-21 DIAGNOSIS — Y842 Radiological procedure and radiotherapy as the cause of abnormal reaction of the patient, or of later complication, without mention of misadventure at the time of the procedure: Secondary | ICD-10-CM | POA: Insufficient documentation

## 2010-12-21 DIAGNOSIS — Z79899 Other long term (current) drug therapy: Secondary | ICD-10-CM | POA: Insufficient documentation

## 2010-12-21 DIAGNOSIS — C539 Malignant neoplasm of cervix uteri, unspecified: Secondary | ICD-10-CM | POA: Insufficient documentation

## 2010-12-21 DIAGNOSIS — N899 Noninflammatory disorder of vagina, unspecified: Secondary | ICD-10-CM | POA: Insufficient documentation

## 2010-12-25 NOTE — Consult Note (Signed)
  [name], [name] NO.:  [id number]  MEDICAL RECORD NO.:  [id number]  LOCATION:  GYN                          FACILITY:  [facility]  PHYSICIAN:  De Blanch, M.D.DATE OF BIRTH:  1955-08-13  DATE OF CONSULTATION:  12/21/2010 DATE OF DISCHARGE:                                CONSULTATION   CHIEF COMPLAINT:  Cervical cancer.  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  Since her last visit, she has done well. She denies any GI or GU symptoms.  She has no pelvic pain, pressure, vaginal bleed or discharge.  She continues to be followed by Dr. Patsi Sears with regard to hydronephrosis.  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  She had poor anatomy and therefore brachytherapy could not be accomplished, so she underwent an extra fascial hysterectomy, bilateral salpingo- oophorectomy.  She had progressive radionecrosis of the upper vagina and some transient bilateral hydronephrosis which resulted from radiation therapy.  The patient is menopausal and does well taking Premarin and Effexor.  PAST MEDICAL HISTORY:  Medical illnesses, none.  PAST SURGICAL HISTORY:  Total abdominal hysterectomy, bilateral salpingo- oophorectomy, cholecystectomy, ureteral stent placement.  OBSTETRICAL HISTORY:  Gravida 1.  CURRENT MEDICATIONS:  Premarin 0.65 mg daily, Effexor 37.5 mg nightly.  SOCIAL HISTORY:  The patient is retired, married.  She is a former smoker.  FAMILY HISTORY:  Negative for gynecologic, breast or colon cancer.  REVIEW OF SYSTEMS:  10-point comprehensive review of systems negative, except as noted above.  PHYSICAL EXAMINATION:  VITAL SIGNS:  Weight 153 pounds (stable), blood pressure 120/72, pulse 68, respiratory rate  20. GENERAL:  The patient is a healthy white female in no acute distress. HEENT:  Negative. NECK:  Supple without thyromegaly.  There is no supraclavicular or inguinal adenopathy. ABDOMEN:  Soft, nontender.  No mass, organomegaly, ascites or hernias noted. PELVIC:  EGBUS, vagina, urethra are normal.  The cervix and uterus surgically absent.  There are radiation changes throughout the pelvis with fibrosis.  Bimanual rectovaginal exam reveal no masses, induration or nodularity. LOWER EXTREMITIES:  Without edema or varicosities.  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  The patient remains free of disease.  Pap smears obtained today.  At this juncture, we will release the patient from our practice.  I would advise that she be followed annually by a gynecologist but the patient would like to see Dr. Roseanna Rainbow.     De Blanch, M.D.     DC/MEDQ  D:  12/21/2010  T:  12/21/2010  Job:  [id number]  cc:   Roseanna Rainbow, M.D. Fax: [phone]  Billie Lade, Ph.D., M.D. Fax: [phone]  Bertram Millard. Dahlstedt, M.D. Fax: [phone]  Telford Nab, R.N. [address] [place], [place] [address]  Prudy Feeler, [place] Fax: [phone]  Electronically Signed by De Blanch M.D. on 12/25/2010 02:72:53 PM

## 2014-03-04 ENCOUNTER — Encounter (HOSPITAL_COMMUNITY): Payer: Self-pay | Admitting: Emergency Medicine

## 2014-03-04 ENCOUNTER — Emergency Department (HOSPITAL_COMMUNITY)
Admission: EM | Admit: 2014-03-04 | Discharge: 2014-03-04 | Disposition: A | Discharge status: Home / Self Care | Payer: Managed Care, Other (non HMO) | Attending: Emergency Medicine | Admitting: Emergency Medicine

## 2014-03-04 DIAGNOSIS — F17200 Nicotine dependence, unspecified, uncomplicated: Secondary | ICD-10-CM | POA: Diagnosis not present

## 2014-03-04 DIAGNOSIS — Z792 Long term (current) use of antibiotics: Secondary | ICD-10-CM | POA: Diagnosis not present

## 2014-03-04 DIAGNOSIS — K921 Melena: Secondary | ICD-10-CM | POA: Insufficient documentation

## 2014-03-04 DIAGNOSIS — Z8541 Personal history of malignant neoplasm of cervix uteri: Secondary | ICD-10-CM | POA: Insufficient documentation

## 2014-03-04 DIAGNOSIS — R197 Diarrhea, unspecified: Secondary | ICD-10-CM

## 2014-03-04 DIAGNOSIS — Z79899 Other long term (current) drug therapy: Secondary | ICD-10-CM | POA: Diagnosis not present

## 2014-03-04 DIAGNOSIS — R109 Unspecified abdominal pain: Secondary | ICD-10-CM | POA: Diagnosis present

## 2014-03-04 DIAGNOSIS — Z90710 Acquired absence of both cervix and uterus: Secondary | ICD-10-CM | POA: Insufficient documentation

## 2014-03-04 DIAGNOSIS — Z9089 Acquired absence of other organs: Secondary | ICD-10-CM | POA: Insufficient documentation

## 2014-03-04 HISTORY — DX: Dizziness and giddiness: R42

## 2014-03-04 HISTORY — DX: Malignant (primary) neoplasm, unspecified: C80.1

## 2014-03-04 LAB — COMPREHENSIVE METABOLIC PANEL
ALT: 22 U/L (ref 0–35)
AST: 20 U/L (ref 0–37)
Albumin: 3.9 g/dL (ref 3.5–5.2)
Alkaline Phosphatase: 46 U/L (ref 39–117)
Anion gap: 14 (ref 5–15)
BUN: 14 mg/dL (ref 6–23)
CHLORIDE: 107 meq/L (ref 96–112)
CO2: 22 meq/L (ref 19–32)
CREATININE: 1 mg/dL (ref 0.50–1.10)
Calcium: 9.7 mg/dL (ref 8.4–10.5)
GFR calc Af Amer: 71 mL/min — ABNORMAL LOW (ref >90)
GFR, EST NON AFRICAN AMERICAN: 61 mL/min — AB (ref >90)
GLUCOSE: 117 mg/dL — AB (ref 70–99)
Potassium: 4 mEq/L (ref 3.7–5.3)
Sodium: 143 mEq/L (ref 137–147)
TOTAL PROTEIN: 6.8 g/dL (ref 6.0–8.3)
Total Bilirubin: 0.2 mg/dL — ABNORMAL LOW (ref 0.3–1.2)

## 2014-03-04 LAB — CBC WITH DIFFERENTIAL/PLATELET
BASOS ABS: 0 10*3/uL (ref 0.0–0.1)
BASOS PCT: 0 % (ref 0–1)
EOS ABS: 0.1 10*3/uL (ref 0.0–0.7)
EOS PCT: 1 % (ref 0–5)
HCT: 36.6 % (ref 36.0–46.0)
Hemoglobin: 13.1 g/dL (ref 12.0–15.0)
Lymphocytes Relative: 17 % (ref 12–46)
Lymphs Abs: 1 10*3/uL (ref 0.7–4.0)
MCH: 32.5 pg (ref 26.0–34.0)
MCHC: 35.8 g/dL (ref 30.0–36.0)
MCV: 90.8 fL (ref 78.0–100.0)
Monocytes Absolute: 0.3 10*3/uL (ref 0.1–1.0)
Monocytes Relative: 5 % (ref 3–12)
NEUTROS ABS: 4.8 10*3/uL (ref 1.7–7.7)
NEUTROS PCT: 77 % (ref 43–77)
PLATELETS: 207 10*3/uL (ref 150–400)
RBC: 4.03 MIL/uL (ref 3.87–5.11)
RDW: 12.2 % (ref 11.5–15.5)
WBC: 6.2 10*3/uL (ref 4.0–10.5)

## 2014-03-04 LAB — URINALYSIS, ROUTINE W REFLEX MICROSCOPIC
Bilirubin Urine: NEGATIVE
Glucose, UA: NEGATIVE mg/dL
HGB URINE DIPSTICK: NEGATIVE
Ketones, ur: NEGATIVE mg/dL
NITRITE: NEGATIVE
PH: 6.5 (ref 5.0–8.0)
PROTEIN: NEGATIVE mg/dL
SPECIFIC GRAVITY, URINE: 1.008 (ref 1.005–1.030)
Urobilinogen, UA: 0.2 mg/dL (ref 0.0–1.0)

## 2014-03-04 LAB — LIPASE, BLOOD: LIPASE: 13 U/L (ref 11–59)

## 2014-03-04 LAB — URINE MICROSCOPIC-ADD ON

## 2014-03-04 LAB — POC OCCULT BLOOD, ED: FECAL OCCULT BLD: NEGATIVE

## 2014-03-04 MED ORDER — ONDANSETRON HCL 4 MG PO TABS: 4.0000 mg | ORAL_TABLET | Freq: Three times a day (TID) | ORAL | Status: DC | PRN

## 2014-03-04 MED ORDER — ONDANSETRON HCL 4 MG/2ML IJ SOLN
4.0000 mg | Freq: Once | INTRAMUSCULAR | Status: AC
  Administered 2014-03-04: 4 mg via INTRAVENOUS
  Filled 2014-03-04: qty 2

## 2014-03-04 MED ORDER — SODIUM CHLORIDE 0.9 % IV BOLUS (SEPSIS)
1000.0000 mL | Freq: Once | INTRAVENOUS | Status: AC
  Administered 2014-03-04: 1000 mL via INTRAVENOUS

## 2014-03-04 NOTE — ED Provider Notes (Signed)
CSN: [id number]     Arrival date & time 03/04/14  3154 History   First MD Initiated Contact with Patient 03/04/14 0957     Chief Complaint  Patient presents with  . Melena  . Abdominal Pain     (Consider location/radiation/quality/duration/timing/severity/associated sxs/prior Treatment) HPI [name] is a 58 year old female with past medical history vertigo and cervical cancer, presenting with one day of black-colored stools. Patient states she noticed yesterday afternoon she had bowel movement which was black in color. Patient states since then she has had 3 or 4 bowel movements which have all been black in color. Patient reports some associated "queasiness"since last week. Patient denies abdominal pain, vomiting, fever, dysuria, chest pain, shortness of breath. Patient states she's never had any issues with her bowel movements, and denies her having GI bleed.   Past Medical History  Diagnosis Date  . Vertigo   . Cancer     cervical 2003   Past Surgical History  Procedure Laterality Date  . Abdominal hysterectomy    . Cholecystectomy     No family history on file. History  Substance Use Topics  . Smoking status: Current Some Day Smoker  . Smokeless tobacco: Not on file  . Alcohol Use: Yes   OB History   Grav Para Term Preterm Abortions TAB SAB Ect Mult Living                 Review of Systems  Constitutional: Negative for fever.  HENT: Negative for trouble swallowing.   Eyes: Negative for visual disturbance.  Respiratory: Negative for shortness of breath.   Cardiovascular: Negative for chest pain.  Gastrointestinal: Positive for nausea and blood in stool. Negative for vomiting and abdominal pain.  Genitourinary: Negative for dysuria.  Musculoskeletal: Negative for neck pain.  Skin: Negative for rash.  Neurological: Negative for dizziness, weakness and numbness.  Psychiatric/Behavioral: Negative.       Allergies  Percocet  Home Medications   Prior to  Admission medications   Medication Sig Start Date End Date Taking? Authorizing Provider  amoxicillin (AMOXIL) 500 MG capsule Take 500 mg by mouth 2 (two) times daily. 03/04/14  Yes Historical Provider, MD  cetirizine (ZYRTEC) 10 MG tablet Take 10 mg by mouth daily.   Yes Historical Provider, MD  fenofibrate micronized (LOFIBRA) 134 MG capsule Take 134 mg by mouth daily before breakfast.   Yes Historical Provider, MD  fluticasone (FLONASE) 50 MCG/ACT nasal spray Place 1 spray into both nostrils 2 (two) times daily.   Yes Historical Provider, MD  levothyroxine (SYNTHROID, LEVOTHROID) 25 MCG tablet Take 25 mcg by mouth daily before breakfast.   Yes Historical Provider, MD  meclizine (ANTIVERT) 25 MG tablet Take 25 mg by mouth 4 (four) times daily. 03/03/14  Yes Historical Provider, MD  predniSONE (STERAPRED UNI-PAK) 10 MG tablet Take by mouth as directed. 03/04/14  Yes Historical Provider, MD  simvastatin (ZOCOR) 20 MG tablet Take 20 mg by mouth daily at 6 PM.   Yes Historical Provider, MD  ondansetron (ZOFRAN) 4 MG tablet Take 1 tablet (4 mg total) by mouth every 8 (eight) hours as needed for nausea or vomiting. 03/04/14   Carrie Mew, PA-C   BP 146/86  Pulse 58  Temp(Src) 98.2 F (36.8 C) (Oral)  Resp 17  SpO2 100% Physical Exam  Nursing note and vitals reviewed. Constitutional: She is oriented to person, place, and time. She appears well-developed and well-nourished. No distress.  HENT:  Head: Normocephalic and atraumatic.  Mouth/Throat: Oropharynx is clear and moist. No oropharyngeal exudate.  Eyes: Right eye exhibits no discharge. Left eye exhibits no discharge. No scleral icterus.  Neck: Normal range of motion.  Cardiovascular: Normal rate, regular rhythm and normal heart sounds.   No murmur heard. Pulmonary/Chest: Effort normal and breath sounds normal. No respiratory distress.  Abdominal: Soft. There is no tenderness.  Genitourinary: Rectal exam shows external hemorrhoid. Rectal  exam shows no fissure. Guaiac positive stool.  Small hemorrhoid noted at 6:00 position of rectum. Rectal tone is normal. Moderate amount of dark  colored stool noted in rectal vault. Chaperone present during the rectal exam.  Musculoskeletal: Normal range of motion. She exhibits no edema and no tenderness.  Neurological: She is alert and oriented to person, place, and time. No cranial nerve deficit. Coordination normal.  Skin: Skin is warm and dry. No rash noted. She is not diaphoretic.  Psychiatric: She has a normal mood and affect.    ED Course  Procedures (including critical care time) Labs Review Labs Reviewed  COMPREHENSIVE METABOLIC PANEL - Abnormal; Notable for the following:    Glucose, Bld 117 (*)    Total Bilirubin 0.2 (*)    GFR calc non Af Amer 61 (*)    GFR calc Af Amer 71 (*)    All other components within normal limits  URINALYSIS, ROUTINE W REFLEX MICROSCOPIC - Abnormal; Notable for the following:    Leukocytes, UA SMALL (*)    All other components within normal limits  URINE MICROSCOPIC-ADD ON - Abnormal; Notable for the following:    Squamous Epithelial / LPF FEW (*)    Bacteria, UA FEW (*)    All other components within normal limits  CBC WITH DIFFERENTIAL  LIPASE, BLOOD  POC OCCULT BLOOD, ED    Imaging Review No results found.   EKG Interpretation None      MDM   Final diagnoses:  Diarrhea    One day of dark, "black stools"x3-4. Gen. "queasiness" for the past week.  She reports mild dizziness, states this is normal for her do to vertigo. No vomiting, fever, chest pain, shortness of breath, dysuria. No abdominal pain on exam. Patient well appearing, speaking in full, clear sentences, in no acute distress.  11:55 AM: Occult blood negative.   12:04 PM: Labs return with no evidence of leukocytosis, anemia. Patient's UA shows few bacteria, few squamous epithelial cells, small leukocytes. Patient states she just finished a course of antibiotics "a few  days ago" for urinary tract infection prescribed by her urologist. Patient states her symptoms have completely improved from her UTI.  1:30 PM:  Patient asymptomatic at this time. States she is ready to home. Patient brings up that she didn't mention earlier she has been experiencing a "cramping" left lower quadrant abdominal pain which is "mild"and intermittent. Patient states she has not experienced that pain today. She states she noticed a "a few days ago" and that it has been "off and on" since then. I advised patient that we could further workup her pain today, however patient states she would much prefer to go home and follow up with her primary care physician regarding this intermittent pain. Patient states she's not concerned about it today. We will discharge patient at this time, and have her followup with her primary care physician.  Patient is agreeable to this plan. I encouraged patient to call or return to the ER should her symptoms persist, worsen or should she develop any fever, abdominal pain, nausea, vomiting,  or should she have any questions or concerns.   BP 146/86  Pulse 58  Temp(Src) 98.2 F (36.8 C) (Oral)  Resp 17  SpO2 100%   Signed,  Dahlia Bailiff, PA-C 5:55 PM   This patient seen and discussed with Dr. Lajean Saver, MD   Carrie Mew, PA-C 03/04/14 1755

## 2014-03-04 NOTE — ED Notes (Signed)
Pt states yesterday afternoon she had a BM with little black stool, now recently all her stools have been all black. Pt also c/o lower abd aching. Pt states she feels nauseous but no vomiting.

## 2014-03-04 NOTE — Discharge Instructions (Signed)
Followup with your primary care physician. Return to the ER if your symptoms change, persist, worsen or should you have any questions or concerns. Use Zofran 1 tablet every 8 hours as needed for nausea.   Diarrhea Diarrhea is frequent loose and watery bowel movements. It can cause you to feel weak and dehydrated. Dehydration can cause you to become tired and thirsty, have a dry mouth, and have decreased urination that often is dark yellow. Diarrhea is a sign of another problem, most often an infection that will not last long. In most cases, diarrhea typically lasts 2-3 days. However, it can last longer if it is a sign of something more serious. It is important to treat your diarrhea as directed by your caregiver to lessen or prevent future episodes of diarrhea. CAUSES  Some common causes include:  Gastrointestinal infections caused by viruses, bacteria, or parasites.  Food poisoning or food allergies.  Certain medicines, such as antibiotics, chemotherapy, and laxatives.  Artificial sweeteners and fructose.  Digestive disorders. HOME CARE INSTRUCTIONS  Ensure adequate fluid intake (hydration): Have 1 cup (8 oz) of fluid for each diarrhea episode. Avoid fluids that contain simple sugars or sports drinks, fruit juices, whole milk products, and sodas. Your urine should be clear or pale yellow if you are drinking enough fluids. Hydrate with an oral rehydration solution that you can purchase at pharmacies, retail stores, and online. You can prepare an oral rehydration solution at home by mixing the following ingredients together:   - tsp table salt.   tsp baking soda.   tsp salt substitute containing potassium chloride.  1  tablespoons sugar.  1 L (34 oz) of water.  Certain foods and beverages may increase the speed at which food moves through the gastrointestinal (GI) tract. These foods and beverages should be avoided and include:  Caffeinated and alcoholic beverages.  High-fiber foods,  such as raw fruits and vegetables, nuts, seeds, and whole grain breads and cereals.  Foods and beverages sweetened with sugar alcohols, such as xylitol, sorbitol, and mannitol.  Some foods may be well tolerated and may help thicken stool including:  Starchy foods, such as rice, toast, pasta, low-sugar cereal, oatmeal, grits, baked potatoes, crackers, and bagels.  Bananas.  Applesauce.  Add probiotic-rich foods to help increase healthy bacteria in the GI tract, such as yogurt and fermented milk products.  Wash your hands well after each diarrhea episode.  Only take over-the-counter or prescription medicines as directed by your caregiver.  Take a warm bath to relieve any burning or pain from frequent diarrhea episodes. SEEK IMMEDIATE MEDICAL CARE IF:   You are unable to keep fluids down.  You have persistent vomiting.  You have blood in your stool, or your stools are black and tarry.  You do not urinate in 6-8 hours, or there is only a small amount of very dark urine.  You have abdominal pain that increases or localizes.  You have weakness, dizziness, confusion, or light-headedness.  You have a severe headache.  Your diarrhea gets worse or does not get better.  You have a fever or persistent symptoms for more than 2-3 days.  You have a fever and your symptoms suddenly get worse. MAKE SURE YOU:   Understand these instructions.  Will watch your condition.  Will get help right away if you are not doing well or get worse. Document Released: 05/24/2002 Document Revised: 10/18/2013 Document Reviewed: 02/09/2012 [facility] Patient Information 2015 [place], [place]. This information is not intended to replace advice given  to you by your health care provider. Make sure you discuss any questions you have with your health care provider.

## 2014-03-06 NOTE — ED Provider Notes (Signed)
Medical screening examination/treatment/procedure(s) were conducted as a shared visit with non-physician practitioner(s) and myself.  I personally evaluated the patient during the encounter.  Pt presents w dark colored stool in past day. No faintness or dizziness. abd soft nt. Labs.    Mirna Mires, MD 03/06/14 [phone]

## 2016-01-15 ENCOUNTER — Telehealth: Payer: Self-pay | Admitting: Family Medicine

## 2016-01-15 NOTE — Telephone Encounter (Signed)
Release of records mailed to pt.

## 2016-03-04 ENCOUNTER — Ambulatory Visit (INDEPENDENT_AMBULATORY_CARE_PROVIDER_SITE_OTHER): Payer: BLUE CROSS/BLUE SHIELD | Admitting: Physician Assistant

## 2016-03-04 ENCOUNTER — Encounter: Payer: Self-pay | Admitting: Physician Assistant

## 2016-03-04 VITALS — BP 134/82 | HR 62 | Temp 98.2°F | Ht 62.0 in | Wt 145.6 lb

## 2016-03-04 DIAGNOSIS — H8301 Labyrinthitis, right ear: Secondary | ICD-10-CM | POA: Diagnosis not present

## 2016-03-04 DIAGNOSIS — R5382 Chronic fatigue, unspecified: Secondary | ICD-10-CM | POA: Diagnosis not present

## 2016-03-04 DIAGNOSIS — J0100 Acute maxillary sinusitis, unspecified: Secondary | ICD-10-CM

## 2016-03-04 DIAGNOSIS — J309 Allergic rhinitis, unspecified: Secondary | ICD-10-CM

## 2016-03-04 MED ORDER — METHYLPREDNISOLONE ACETATE 80 MG/ML IJ SUSP
80.0000 mg | Freq: Once | INTRAMUSCULAR | Status: AC
  Administered 2016-03-04: 80 mg via INTRAMUSCULAR

## 2016-03-04 MED ORDER — DIAZEPAM 2 MG PO TABS: 2.0000 mg | ORAL_TABLET | Freq: Four times a day (QID) | ORAL | 0 refills | Status: DC | PRN

## 2016-03-04 MED ORDER — MONTELUKAST SODIUM 10 MG PO TABS: 10.0000 mg | ORAL_TABLET | Freq: Every day | ORAL | 11 refills | Status: DC

## 2016-03-04 MED ORDER — AMOXICILLIN 500 MG PO CAPS: 1000.0000 mg | ORAL_CAPSULE | Freq: Two times a day (BID) | ORAL | 0 refills | Status: DC

## 2016-03-04 NOTE — Progress Notes (Signed)
BP 134/82   Pulse 62   Temp 98.2 F (36.8 C) (Oral)   Ht 5\' 2"  (1.575 m)   Wt 145 lb 9.6 oz (66 kg)   BMI 26.63 kg/m    Subjective:    Patient ID: [name], female    DOB: 1956-01-11, 60 y.o.   MRN: [id number]  [name] is a 60 y.o. female presenting on 03/04/2016 for fluid behind right ear   HPI Patient here to be established as new patient at [facility].  This patient is known to me from [facility]. She has recurrent episodes of labyrinthitis, and often exacerbated with allergy flares.  Despite zyrtec, flonase she is having a great amount of allergies right now. Denies fever or chills, but does have headahce and dizziness particularly with head movement. Denies ringing or hearing loss. Taking Antivert has made her feel worse. Stopped taking it. Try Claritin in past and was very sleepy with it.  Has not tried singulair.  All medications and conditions are reviewed. Also complains of fatigue daily for many months. Not sure how long ago her well labs were performed but sure it has been longer than 6 months. Has hysterectomy and therefore no bleeding gynecologically. Denies blood in stools or dark black stool.   Relevant past medical, surgical, family and social history reviewed and updated as indicated. Interim medical history since our last visit reviewed. Allergies and medications reviewed and updated.   Data reviewed from any sources in EPIC.  Review of Systems  Constitutional: Positive for fatigue. Negative for activity change and fever.  HENT: Positive for ear pain, postnasal drip, sinus pressure and sore throat. Negative for ear discharge and hearing loss.   Eyes: Negative.   Respiratory: Negative.  Negative for cough.   Cardiovascular: Negative.  Negative for chest pain.  Gastrointestinal: Negative.  Negative for abdominal pain.  Endocrine: Negative.   Genitourinary: Negative.  Negative for dysuria.  Musculoskeletal: Negative.     Skin: Negative.   Allergic/Immunologic: Positive for environmental allergies.  Neurological: Positive for dizziness and headaches. Negative for syncope.    Per HPI unless specifically indicated above  Social History   Social History  . Marital status: Married    Spouse name: N/A  . Number of children: N/A  . Years of education: N/A   Occupational History  . Not on file.   Social History Main Topics  . Smoking status: Current Some Day Smoker  . Smokeless tobacco: Not on file  . Alcohol use Yes  . Drug use: Unknown  . Sexual activity: Not on file   Other Topics Concern  . Not on file   Social History Narrative  . No narrative on file    Past Surgical History:  Procedure Laterality Date  . ABDOMINAL HYSTERECTOMY    . CHOLECYSTECTOMY      No family history on file.    Medication List       Accurate as of 03/04/16 12:56 PM. Always use your most recent med list.          amoxicillin 500 MG capsule Commonly known as:  AMOXIL Take 2 capsules (1,000 mg total) by mouth 2 (two) times daily.   cetirizine 10 MG tablet Commonly known as:  ZYRTEC Take 10 mg by mouth daily.   diazepam 2 MG tablet Commonly known as:  VALIUM Take 1 tablet (2 mg total) by mouth every 6 (six) hours as needed (dizziness). Can try 1/2 tablet first  fenofibrate micronized 134 MG capsule Commonly known as:  LOFIBRA Take 134 mg by mouth daily before breakfast.   fluticasone 50 MCG/ACT nasal spray Commonly known as:  FLONASE Place 1 spray into both nostrils 2 (two) times daily.   levothyroxine 25 MCG tablet Commonly known as:  SYNTHROID, LEVOTHROID Take 25 mcg by mouth daily before breakfast.   montelukast 10 MG tablet Commonly known as:  SINGULAIR Take 1 tablet (10 mg total) by mouth at bedtime.   simvastatin 20 MG tablet Commonly known as:  ZOCOR Take 20 mg by mouth daily at 6 PM.          Objective:    BP 134/82   Pulse 62   Temp 98.2 F (36.8 C) (Oral)   Ht 5\' 2"   (1.575 m)   Wt 145 lb 9.6 oz (66 kg)   BMI 26.63 kg/m   Allergies  Allergen Reactions  . Percocet [Oxycodone-Acetaminophen] Other (See Comments)    migraine    Wt Readings from Last 3 Encounters:  03/04/16 145 lb 9.6 oz (66 kg)    Physical Exam  Constitutional: She is oriented to person, place, and time. She appears well-developed and well-nourished.  HENT:  Head: Normocephalic and atraumatic.  Right Ear: External ear normal. No mastoid tenderness. Tympanic membrane is scarred. A middle ear effusion is present.  Left Ear: Tympanic membrane and external ear normal.  No middle ear effusion.  Nose: Mucosal edema and rhinorrhea present. Right sinus exhibits maxillary sinus tenderness. Left sinus exhibits no maxillary sinus tenderness.  Mouth/Throat: Uvula is midline. Posterior oropharyngeal erythema present.  Eyes: Conjunctivae and EOM are normal. Pupils are equal, round, and reactive to light. Right eye exhibits no discharge. Left eye exhibits no discharge.  Neck: Normal range of motion.  Cardiovascular: Normal rate, regular rhythm and normal heart sounds.   Pulmonary/Chest: Effort normal and breath sounds normal. No respiratory distress. She has no wheezes.  Abdominal: Soft.  Lymphadenopathy:    She has no cervical adenopathy.  Neurological: She is alert and oriented to person, place, and time.  Skin: Skin is warm and dry.  Psychiatric: She has a normal mood and affect.  Nursing note and vitals reviewed.       Assessment & Plan:   1. Labyrinthitis, right - diazepam (VALIUM) 2 MG tablet; Take 1 tablet (2 mg total) by mouth every 6 (six) hours as needed (dizziness). Can try 1/2 tablet first  Dispense: 30 tablet; Refill: 0 - methylPREDNISolone acetate (DEPO-MEDROL) injection 80 mg; Inject 1 mL (80 mg total) into the muscle once.  2. Acute maxillary sinusitis, recurrence not specified - amoxicillin (AMOXIL) 500 MG capsule; Take 2 capsules (1,000 mg total) by mouth 2 (two) times  daily.  Dispense: 40 capsule; Refill: 0  3. Allergic rhinitis, unspecified allergic rhinitis type - montelukast (SINGULAIR) 10 MG tablet; Take 1 tablet (10 mg total) by mouth at bedtime.  Dispense: 30 tablet; Refill: 11  4. Chronic fatigue - CBC with Differential/Platelet - TSH   Continue all other maintenance medications as listed above. Educational handout given for labyrinthitis  Follow up plan: Return in about 3 months (around 06/03/2016).  Terald Sleeper PA-C [place] [address]  [place], [place] [address] [phone]   03/04/2016, 12:56 PM

## 2016-03-04 NOTE — Patient Instructions (Signed)
Labyrinthitis Labyrinthitis is an infection of the inner ear. Your inner ear is a fluid-filled system of tubes and canals (labyrinth). Nerve cells in your inner ear send signals for hearing and balance to your brain. When tiny germs (microorganisms) get inside the labyrinth, they harm the cells that send messages to the brain. Labyrinthitis can cause changes in hearing and balance. Most cases of labyrinthitis come on suddenly and they clear up within weeks. If the infection damages parts of the labyrinth, some symptoms may remain (chronic labyrinthitis). CAUSES Viruses are the most common cause of labyrinthitis. Viruses that spread into the labyrinth are the same viruses that cause other diseases, such as:  Mononucleosis.  Measles.  Flu.  Herpes. Bacteria can also cause labyrinthitis when they spread into the labyrinth from an infection in the brain or the middle ear. Bacteria can cause:  Serous labyrinthitis. This type of labyrinthitis develops when bacteria produce a poison (toxin) that gets inside the labyrinth.  Suppurative labyrinthitis. This type of labyrinthitis develops when bacteria get inside the labyrinth. RISK FACTORS You may be at greater risk for labyrinthitis if you:  Drink a lot of alcohol.  Smoke.  Take certain drugs.  Are not well rested (fatigued).  Are under a lot of stress.  Have allergies.  Recently had a nose or throat infection (upper respiratory infection) or an ear infection. SYMPTOMS Symptoms of labyrinthitis usually start suddenly. The symptoms can be mild or strong and may include:  Dizziness.  Hearing loss.  A feeling that you are moving when you are not (vertigo).  Ringing in the ear (tinnitus).  Nausea and vomiting.  Trouble focusing your eyes. Symptoms of chronic labyrinthitis may include:  Fatigue.  Confusion.  Hearing loss.  Tinnitus.  Poor balance.  Vertigo after sudden head movements. DIAGNOSIS Your health care  provider may suspect labyrinthitis if you suddenly get dizzy and lose hearing, especially if you had a recent upper respiratory infection. Your health care provider will perform a physical exam to:  Check your ears for infection.  Test your balance.  Check your eye movement. Your health care provider may do several tests to rule out other causes of your symptoms and to help make a diagnosis of labyrinthitis. These may include:  Imaging studies, such as a CT scan or an MRI, to look for other causes of your symptoms.  Hearing tests.  Electronystagmography (ENG) to check your balance. TREATMENT Treatment of labyrinthitis depends on the cause. If your labyrinthitis is caused by a virus, it may get better without treatment. If your labyrinthitis is caused by bacteria, you may need medicine to fight the infection (antibiotic medicine). You may also have treatment to relieve labyrinthitis symptoms. Treatments may include:  Medicines to:  Stop dizziness.  Relieve nausea.  Treat the inflamed area.  Speed up your recovery.  Bed rest until dizziness goes away.  Fluids given through an IV tube. You may need this treatment if you have too little fluid in your body (dehydrated) from repeated nausea and vomiting. HOME CARE INSTRUCTIONS  Take medicines only as directed by your health care provider.  If you were prescribed an antibiotic medicine, finish all of it even if you start to feel better.  Rest as much as possible.  Avoid loud noises and bright lights.  Do not make sudden movements until any dizziness goes away.  Do not drive until your health care provider says that you can.  Drink enough fluid to keep your urine clear or pale yellow.  Work with a physical therapist if you still feel dizzy after several weeks. A therapist can teach you exercises to help you adjust to feeling dizzy (vestibular rehabilitation exercises).  Keep all follow-up visits as directed by your health care  provider. This is important. SEEK MEDICAL CARE IF:  Your symptoms are not relieved by medicines.  Your symptoms last longer than two weeks.  You have a fever. SEEK IMMEDIATE MEDICAL CARE IF:  You become very dizzy.  You have nausea or vomiting that does not go away.  Your hearing gets much worse very quickly.   This information is not intended to replace advice given to you by your health care provider. Make sure you discuss any questions you have with your health care provider.   Document Released: 06/24/2014 Document Reviewed: 06/24/2014 Elsevier Interactive Patient Education [employer].

## 2016-03-05 LAB — CBC WITH DIFFERENTIAL/PLATELET
Basophils Absolute: 0 10*3/uL (ref 0.0–0.2)
Basos: 0 %
EOS (ABSOLUTE): 0.1 10*3/uL (ref 0.0–0.4)
EOS: 1 %
HEMATOCRIT: 39.4 % (ref 34.0–46.6)
Hemoglobin: 13.7 g/dL (ref 11.1–15.9)
IMMATURE GRANULOCYTES: 0 %
Immature Grans (Abs): 0 10*3/uL (ref 0.0–0.1)
Lymphocytes Absolute: 1.1 10*3/uL (ref 0.7–3.1)
Lymphs: 19 %
MCH: 31.8 pg (ref 26.6–33.0)
MCHC: 34.8 g/dL (ref 31.5–35.7)
MCV: 91 fL (ref 79–97)
Monocytes Absolute: 0.3 10*3/uL (ref 0.1–0.9)
Monocytes: 6 %
NEUTROS PCT: 74 %
Neutrophils Absolute: 4 10*3/uL (ref 1.4–7.0)
Platelets: 227 10*3/uL (ref 150–379)
RBC: 4.31 x10E6/uL (ref 3.77–5.28)
RDW: 12.8 % (ref 12.3–15.4)
WBC: 5.5 10*3/uL (ref 3.4–10.8)

## 2016-03-05 LAB — TSH: TSH: 1.55 u[IU]/mL (ref 0.450–4.500)

## 2016-03-28 ENCOUNTER — Encounter: Payer: Self-pay | Admitting: Pediatrics

## 2016-03-28 ENCOUNTER — Ambulatory Visit (INDEPENDENT_AMBULATORY_CARE_PROVIDER_SITE_OTHER): Payer: BLUE CROSS/BLUE SHIELD | Admitting: Pediatrics

## 2016-03-28 VITALS — BP 147/80 | HR 57 | Temp 98.2°F | Ht 62.0 in | Wt 142.6 lb

## 2016-03-28 DIAGNOSIS — R309 Painful micturition, unspecified: Secondary | ICD-10-CM

## 2016-03-28 DIAGNOSIS — B379 Candidiasis, unspecified: Secondary | ICD-10-CM | POA: Diagnosis not present

## 2016-03-28 DIAGNOSIS — H8301 Labyrinthitis, right ear: Secondary | ICD-10-CM

## 2016-03-28 DIAGNOSIS — N390 Urinary tract infection, site not specified: Secondary | ICD-10-CM | POA: Diagnosis not present

## 2016-03-28 DIAGNOSIS — R3915 Urgency of urination: Secondary | ICD-10-CM | POA: Diagnosis not present

## 2016-03-28 DIAGNOSIS — R8281 Pyuria: Secondary | ICD-10-CM

## 2016-03-28 LAB — URINALYSIS, COMPLETE
Bilirubin, UA: NEGATIVE
GLUCOSE, UA: NEGATIVE
Ketones, UA: NEGATIVE
Nitrite, UA: NEGATIVE
PH UA: 6 (ref 5.0–7.5)
Protein, UA: NEGATIVE
Specific Gravity, UA: <1.005 — ABNORMAL LOW (ref 1.005–1.030)
UUROB: 0.2 mg/dL (ref 0.2–1.0)

## 2016-03-28 LAB — MICROSCOPIC EXAMINATION: Epithelial Cells (non renal): >10 /hpf — AB (ref 0–10)

## 2016-03-28 MED ORDER — FLUCONAZOLE 150 MG PO TABS: 150.0000 mg | ORAL_TABLET | ORAL | 0 refills | Status: DC | PRN

## 2016-03-28 MED ORDER — DIAZEPAM 2 MG PO TABS: 2.0000 mg | ORAL_TABLET | Freq: Two times a day (BID) | ORAL | 0 refills | Status: DC | PRN

## 2016-03-28 MED ORDER — NITROFURANTOIN MONOHYD MACRO 100 MG PO CAPS: 100.0000 mg | ORAL_CAPSULE | Freq: Two times a day (BID) | ORAL | 0 refills | Status: AC

## 2016-03-28 NOTE — Progress Notes (Signed)
  Subjective:   Patient ID: [name], female    DOB: [birth year], 60 y.o.   MRN: [id number] CC: Abdominal Pain; Urinary Urgency; painful urination; and Vaginal Itching  HPI: [name] is a 60 y.o. female presenting for Abdominal Pain; Urinary Urgency; painful urination; and Vaginal Itching  Bladder spasms Symptoms started 5-6 days ago No fevers Pain with urination Urinary frequency Urinary urgency abd feels bloated Also with vaginal itching Nocturnal urination x 3 last night Some white mucus noted in urine in toilet past day Appetite has been fine  Still with labyrnthitis Has symptoms daily still Meclizine makes the spinning worse Feels more steady on feet now Diazepam did help with symptoms Wants a refill because of ongoing symptoms No further ear pain Finished 20 days of abx for AOM Uses flonase daily  Relevant past medical, surgical, family and social history reviewed. Allergies and medications reviewed and updated. History  Smoking Status  . Current Some Day Smoker  Smokeless Tobacco  . Never Used   ROS: Per HPI   Objective:    BP (!) 147/80   Pulse (!) 57   Temp 98.2 F (36.8 C) (Oral)   Ht 5\' 2"  (1.575 m)   Wt 142 lb 9.6 oz (64.7 kg)   BMI 26.08 kg/m   Wt Readings from Last 3 Encounters:  03/28/16 142 lb 9.6 oz (64.7 kg)  03/04/16 145 lb 9.6 oz (66 kg)    Gen: NAD, alert, cooperative with exam, NCAT EYES: EOMI, no conjunctival injection, or no icterus ENT:  R TM with scarring, no effusion, L TM normal. OP without erythema LYMPH: no cervical LAD CV: NRRR, normal S1/S2, no murmur Resp: CTABL, no wheezes, normal WOB Abd: +BS, soft, mildly tender periumbilical, ND. no guarding or organomegaly, no CVA tenderness Ext: No edema, warm Neuro: Alert and oriented, strength equal b/l UE and LE, coordination grossly normal MSK: normal muscle bulk  Assessment & Plan:  [name] was seen today for abdominal pain, urinary urgency, painful urination and vaginal  itching.  Diagnoses and all orders for this visit:  Urinary urgency -     Urinalysis, Complete  Painful urination -     Urinalysis, Complete  Labyrinthitis, right Ongoing symptoms Diazepam helped #15 tabs given, discussed should not take every day as can become habit forming even after two weeks of use if use daily -     diazepam (VALIUM) 2 MG tablet; Take 1 tablet (2 mg total) by mouth every 12 (twelve) hours as needed (dizziness).   Pyuria -     nitrofurantoin, macrocrystal-monohydrate, (MACROBID) 100 MG capsule; Take 1 capsule (100 mg total) by mouth 2 (two) times daily. -     Urine culture  Yeast infection -     fluconazole (DIFLUCAN) 150 MG tablet; Take 1 tablet (150 mg total) by mouth every three (3) days as needed.  Other orders -     Microscopic Examination    Follow up plan: prn Assunta Found, MD [place]

## 2016-03-30 LAB — URINE CULTURE

## 2016-04-05 ENCOUNTER — Ambulatory Visit (INDEPENDENT_AMBULATORY_CARE_PROVIDER_SITE_OTHER): Payer: BLUE CROSS/BLUE SHIELD

## 2016-04-05 ENCOUNTER — Other Ambulatory Visit (INDEPENDENT_AMBULATORY_CARE_PROVIDER_SITE_OTHER): Payer: BLUE CROSS/BLUE SHIELD

## 2016-04-05 DIAGNOSIS — Z23 Encounter for immunization: Secondary | ICD-10-CM

## 2016-04-05 DIAGNOSIS — E785 Hyperlipidemia, unspecified: Secondary | ICD-10-CM

## 2016-04-06 LAB — CMP14+EGFR
ALBUMIN: 4.4 g/dL (ref 3.5–5.5)
ALT: 18 IU/L (ref 0–32)
AST: 19 IU/L (ref 0–40)
Albumin/Globulin Ratio: 2.2 (ref 1.2–2.2)
Alkaline Phosphatase: 47 IU/L (ref 39–117)
BILIRUBIN TOTAL: 0.3 mg/dL (ref 0.0–1.2)
BUN / CREAT RATIO: 16 (ref 9–23)
BUN: 16 mg/dL (ref 6–24)
CHLORIDE: 104 mmol/L (ref 96–106)
CO2: 24 mmol/L (ref 18–29)
CREATININE: 1.02 mg/dL — AB (ref 0.57–1.00)
Calcium: 9.7 mg/dL (ref 8.7–10.2)
GFR calc non Af Amer: 60 mL/min/{1.73_m2} (ref >59)
GFR, EST AFRICAN AMERICAN: 70 mL/min/{1.73_m2} (ref >59)
Globulin, Total: 2 g/dL (ref 1.5–4.5)
Glucose: 108 mg/dL — ABNORMAL HIGH (ref 65–99)
Potassium: 4.3 mmol/L (ref 3.5–5.2)
Sodium: 145 mmol/L — ABNORMAL HIGH (ref 134–144)
TOTAL PROTEIN: 6.4 g/dL (ref 6.0–8.5)

## 2016-04-06 LAB — LIPID PANEL
CHOLESTEROL TOTAL: 124 mg/dL (ref 100–199)
Chol/HDL Ratio: 2.5 ratio units (ref 0.0–4.4)
HDL: 49 mg/dL (ref >39)
LDL Calculated: 58 mg/dL (ref 0–99)
Triglycerides: 87 mg/dL (ref 0–149)
VLDL Cholesterol Cal: 17 mg/dL (ref 5–40)

## 2016-04-09 ENCOUNTER — Telehealth: Payer: Self-pay | Admitting: Physician Assistant

## 2016-04-09 NOTE — Telephone Encounter (Signed)
Patient notified of results via detailed message on home voicemail

## 2016-05-20 ENCOUNTER — Telehealth: Payer: Self-pay | Admitting: Physician Assistant

## 2016-05-20 NOTE — Telephone Encounter (Signed)
FYI

## 2016-07-25 ENCOUNTER — Telehealth: Payer: Self-pay | Admitting: Physician Assistant

## 2016-07-25 NOTE — Telephone Encounter (Signed)
Hold simvastatin for one month and see if anything changes. If same, restart the medication.

## 2016-07-26 NOTE — Telephone Encounter (Signed)
Details to stop simvastatin for one month left  on patient's voice mail.  Call for any questions.

## 2016-08-06 ENCOUNTER — Encounter: Payer: Self-pay | Admitting: Physician Assistant

## 2016-08-06 ENCOUNTER — Ambulatory Visit (INDEPENDENT_AMBULATORY_CARE_PROVIDER_SITE_OTHER): Payer: BLUE CROSS/BLUE SHIELD | Admitting: Physician Assistant

## 2016-08-06 VITALS — BP 135/87 | HR 82 | Temp 98.2°F | Ht 62.0 in | Wt 140.8 lb

## 2016-08-06 DIAGNOSIS — J0100 Acute maxillary sinusitis, unspecified: Secondary | ICD-10-CM

## 2016-08-06 DIAGNOSIS — F419 Anxiety disorder, unspecified: Secondary | ICD-10-CM | POA: Diagnosis not present

## 2016-08-06 MED ORDER — AMOXICILLIN 500 MG PO CAPS: 1000.0000 mg | ORAL_CAPSULE | Freq: Two times a day (BID) | ORAL | 2 refills | Status: DC

## 2016-08-06 MED ORDER — CITALOPRAM HYDROBROMIDE 20 MG PO TABS: 20.0000 mg | ORAL_TABLET | Freq: Every day | ORAL | 2 refills | Status: DC

## 2016-08-06 MED ORDER — PREDNISONE 10 MG (21) PO TBPK: ORAL_TABLET | ORAL | 2 refills | Status: DC

## 2016-08-06 NOTE — Progress Notes (Signed)
BP 135/87   Pulse 82   Temp 98.2 F (36.8 C) (Oral)   Ht 5\' 2"  (1.575 m)   Wt 140 lb 12.8 oz (63.9 kg)   BMI 25.75 kg/m    Subjective:    Patient ID: [name], female    DOB: 1956/03/28, 61 y.o.   MRN: [id number]  HPI: [name] is a 61 y.o. female presenting on 08/06/2016 for Sinusitis  This patient has had many days of sinus headache and postnasal drainage. There is copious drainage at times. Denies any fever at this time. There has been a history of sinus infections in the past.  No history of sinus surgery. There is cough at night. It has become more prevalent in recent days.  In addition to this patient discloses she has had greatly increased anxiety. She does not want to take and felt lytics at this time. She has a mother with Alzheimer's in a nursing home. She has adult children from a blended marriage that has some difficulties. She feels that she is being much more persistent in her OCD type behaviors. Thinks her husband does get on her nerves much more than a used to. She is ready to start taking some medication.  Relevant past medical, surgical, family and social history reviewed and updated as indicated. Allergies and medications reviewed and updated.  Past Medical History:  Diagnosis Date  . Cancer ([facility])    cervical 2003  . Vertigo     Past Surgical History:  Procedure Laterality Date  . ABDOMINAL HYSTERECTOMY    . CHOLECYSTECTOMY      Review of Systems  Constitutional: Positive for chills and fatigue. Negative for activity change and appetite change.  HENT: Positive for congestion, postnasal drip, sinus pain, sinus pressure and sore throat.   Eyes: Negative.   Respiratory: Negative for cough and wheezing.   Cardiovascular: Negative.  Negative for chest pain, palpitations and leg swelling.  Gastrointestinal: Negative.   Genitourinary: Negative.   Musculoskeletal: Negative.   Skin: Negative.   Neurological: Positive for headaches.    Psychiatric/Behavioral: Positive for decreased concentration and dysphoric mood. The patient is nervous/anxious.     Allergies as of 08/06/2016      Reactions   Percocet [oxycodone-acetaminophen] Other (See Comments)   migraine       Medication List       Accurate as of 08/06/16  4:09 PM. Always use your most recent med list.          amoxicillin 500 MG capsule Commonly known as:  AMOXIL Take 2 capsules (1,000 mg total) by mouth 2 (two) times daily.   cetirizine 10 MG tablet Commonly known as:  ZYRTEC Take 10 mg by mouth daily.   citalopram 20 MG tablet Commonly known as:  CELEXA Take 1 tablet (20 mg total) by mouth daily.   diazepam 2 MG tablet Commonly known as:  VALIUM Take 1 tablet (2 mg total) by mouth every 12 (twelve) hours as needed (dizziness). Can try 1/2 tablet first   fluconazole 150 MG tablet Commonly known as:  DIFLUCAN Take 1 tablet (150 mg total) by mouth every three (3) days as needed.   fluticasone 50 MCG/ACT nasal spray Commonly known as:  FLONASE Place 1 spray into both nostrils 2 (two) times daily.   levothyroxine 25 MCG tablet Commonly known as:  SYNTHROID, LEVOTHROID Take 25 mcg by mouth daily before breakfast.   montelukast 10 MG tablet Commonly known as:  SINGULAIR Take 1 tablet (  10 mg total) by mouth at bedtime.   predniSONE 10 MG (21) Tbpk tablet Commonly known as:  STERAPRED UNI-PAK 21 TAB As directed x 6 days   simvastatin 20 MG tablet Commonly known as:  ZOCOR Take 20 mg by mouth daily at 6 PM.          Objective:    BP 135/87   Pulse 82   Temp 98.2 F (36.8 C) (Oral)   Ht 5\' 2"  (1.575 m)   Wt 140 lb 12.8 oz (63.9 kg)   BMI 25.75 kg/m   Allergies  Allergen Reactions  . Percocet [Oxycodone-Acetaminophen] Other (See Comments)    migraine     Physical Exam  Constitutional: She is oriented to person, place, and time. She appears well-developed and well-nourished.  HENT:  Head: Normocephalic and atraumatic.   Right Ear: Tympanic membrane and external ear normal. No middle ear effusion.  Left Ear: Tympanic membrane and external ear normal.  No middle ear effusion.  Nose: Mucosal edema and rhinorrhea present. Right sinus exhibits no maxillary sinus tenderness. Left sinus exhibits no maxillary sinus tenderness.  Mouth/Throat: Uvula is midline. Posterior oropharyngeal erythema present.  Eyes: Conjunctivae and EOM are normal. Pupils are equal, round, and reactive to light. Right eye exhibits no discharge. Left eye exhibits no discharge.  Neck: Normal range of motion.  Cardiovascular: Normal rate, regular rhythm and normal heart sounds.   Pulmonary/Chest: Effort normal and breath sounds normal. No respiratory distress. She has no wheezes.  Abdominal: Soft.  Lymphadenopathy:    She has no cervical adenopathy.  Neurological: She is alert and oriented to person, place, and time.  Skin: Skin is warm and dry.  Psychiatric: She has a normal mood and affect.  Nursing note and vitals reviewed.       Assessment & Plan:   1. Acute non-recurrent maxillary sinusitis - amoxicillin (AMOXIL) 500 MG capsule; Take 2 capsules (1,000 mg total) by mouth 2 (two) times daily.  Dispense: 40 capsule; Refill: 2 - predniSONE (STERAPRED UNI-PAK 21 TAB) 10 MG (21) TBPK tablet; As directed x 6 days  Dispense: 21 tablet; Refill: 2  2. Anxiety - citalopram (CELEXA) 20 MG tablet; Take 1 tablet (20 mg total) by mouth daily.  Dispense: 30 tablet; Refill: 2   Continue all other maintenance medications as listed above.  Follow up plan: Return in about 3 months (around 11/03/2016) for recheck meds.  Educational handout given for anxiety   Terald Sleeper PA-C [place] [address]  [place], [place] [address] [phone]   08/06/2016, 4:09 PM

## 2016-08-06 NOTE — Patient Instructions (Signed)
Generalized Anxiety Disorder Generalized anxiety disorder (GAD) is a mental disorder. It interferes with life functions, including relationships, work, and school. GAD is different from normal anxiety, which everyone experiences at some point in their lives in response to specific life events and activities. Normal anxiety actually helps us prepare for and get through these life events and activities. Normal anxiety goes away after the event or activity is over.  GAD causes anxiety that is not necessarily related to specific events or activities. It also causes excess anxiety in proportion to specific events or activities. The anxiety associated with GAD is also difficult to control. GAD can vary from mild to severe. People with severe GAD can have intense waves of anxiety with physical symptoms (panic attacks).  SYMPTOMS The anxiety and worry associated with GAD are difficult to control. This anxiety and worry are related to many life events and activities and also occur more days than not for 6 months or longer. People with GAD also have three or more of the following symptoms (one or more in children):  Restlessness.   Fatigue.  Difficulty concentrating.   Irritability.  Muscle tension.  Difficulty sleeping or unsatisfying sleep. DIAGNOSIS GAD is diagnosed through an assessment by your health care provider. Your health care provider will ask you questions aboutyour mood,physical symptoms, and events in your life. Your health care provider may ask you about your medical history and use of alcohol or drugs, including prescription medicines. Your health care provider may also do a physical exam and blood tests. Certain medical conditions and the use of certain substances can cause symptoms similar to those associated with GAD. Your health care provider may refer you to a mental health specialist for further evaluation. TREATMENT The following therapies are usually used to treat GAD:    Medication. Antidepressant medication usually is prescribed for long-term daily control. Antianxiety medicines may be added in severe cases, especially when panic attacks occur.   Talk therapy (psychotherapy). Certain types of talk therapy can be helpful in treating GAD by providing support, education, and guidance. A form of talk therapy called cognitive behavioral therapy can teach you healthy ways to think about and react to daily life events and activities.  Stress managementtechniques. These include yoga, meditation, and exercise and can be very helpful when they are practiced regularly. A mental health specialist can help determine which treatment is best for you. Some people see improvement with one therapy. However, other people require a combination of therapies. This information is not intended to replace advice given to you by your health care provider. Make sure you discuss any questions you have with your health care provider. Document Released: 09/28/2012 Document Revised: 06/24/2014 Document Reviewed: 09/28/2012 Elsevier Interactive Patient Education  2017 [employer] Inc[employer]  

## 2016-11-14 ENCOUNTER — Other Ambulatory Visit: Payer: Self-pay | Admitting: Physician Assistant

## 2016-11-14 DIAGNOSIS — F419 Anxiety disorder, unspecified: Secondary | ICD-10-CM

## 2016-12-17 ENCOUNTER — Other Ambulatory Visit: Payer: Self-pay | Admitting: Physician Assistant

## 2016-12-17 DIAGNOSIS — F419 Anxiety disorder, unspecified: Secondary | ICD-10-CM

## 2016-12-19 ENCOUNTER — Other Ambulatory Visit: Payer: Self-pay | Admitting: Physician Assistant

## 2016-12-19 DIAGNOSIS — F419 Anxiety disorder, unspecified: Secondary | ICD-10-CM

## 2016-12-19 NOTE — Telephone Encounter (Signed)
Last seen 08/06/16 [facility]

## 2017-01-15 ENCOUNTER — Other Ambulatory Visit: Payer: Self-pay | Admitting: *Deleted

## 2017-01-15 MED ORDER — FLUTICASONE PROPIONATE 50 MCG/ACT NA SUSP: 1.0000 | Freq: Two times a day (BID) | NASAL | 1 refills | Status: DC

## 2017-01-15 MED ORDER — LEVOTHYROXINE SODIUM 25 MCG PO TABS: 25.0000 ug | ORAL_TABLET | Freq: Every day | ORAL | 0 refills | Status: DC

## 2017-01-15 MED ORDER — SIMVASTATIN 20 MG PO TABS: 20.0000 mg | ORAL_TABLET | Freq: Every day | ORAL | 0 refills | Status: DC

## 2017-02-17 ENCOUNTER — Other Ambulatory Visit: Payer: Self-pay | Admitting: Physician Assistant

## 2017-02-17 DIAGNOSIS — J309 Allergic rhinitis, unspecified: Secondary | ICD-10-CM

## 2017-03-18 ENCOUNTER — Other Ambulatory Visit: Payer: Self-pay | Admitting: Physician Assistant

## 2017-03-18 DIAGNOSIS — J309 Allergic rhinitis, unspecified: Secondary | ICD-10-CM

## 2017-04-09 ENCOUNTER — Encounter: Payer: Self-pay | Admitting: Physician Assistant

## 2017-04-09 ENCOUNTER — Ambulatory Visit (INDEPENDENT_AMBULATORY_CARE_PROVIDER_SITE_OTHER): Payer: BLUE CROSS/BLUE SHIELD | Admitting: Physician Assistant

## 2017-04-09 VITALS — BP 129/90 | HR 89 | Temp 98.4°F | Ht 62.0 in | Wt 146.2 lb

## 2017-04-09 DIAGNOSIS — Z23 Encounter for immunization: Secondary | ICD-10-CM | POA: Diagnosis not present

## 2017-04-09 DIAGNOSIS — Z0000 Encounter for general adult medical examination without abnormal findings: Secondary | ICD-10-CM

## 2017-04-09 DIAGNOSIS — F419 Anxiety disorder, unspecified: Secondary | ICD-10-CM | POA: Insufficient documentation

## 2017-04-09 DIAGNOSIS — G250 Essential tremor: Secondary | ICD-10-CM

## 2017-04-09 DIAGNOSIS — J309 Allergic rhinitis, unspecified: Secondary | ICD-10-CM | POA: Insufficient documentation

## 2017-04-09 MED ORDER — FLUTICASONE PROPIONATE 50 MCG/ACT NA SUSP: 1.0000 | Freq: Two times a day (BID) | NASAL | 3 refills | Status: DC

## 2017-04-09 MED ORDER — LEVOTHYROXINE SODIUM 25 MCG PO TABS: ORAL_TABLET | ORAL | 0 refills | Status: DC

## 2017-04-09 MED ORDER — SIMVASTATIN 20 MG PO TABS: 20.0000 mg | ORAL_TABLET | Freq: Every day | ORAL | 3 refills | Status: DC

## 2017-04-09 MED ORDER — CITALOPRAM HYDROBROMIDE 20 MG PO TABS: 20.0000 mg | ORAL_TABLET | Freq: Every day | ORAL | 3 refills | Status: DC

## 2017-04-09 MED ORDER — MONTELUKAST SODIUM 10 MG PO TABS: 10.0000 mg | ORAL_TABLET | Freq: Every day | ORAL | 3 refills | Status: DC

## 2017-04-09 MED ORDER — PRIMIDONE 50 MG PO TABS: 50.0000 mg | ORAL_TABLET | Freq: Two times a day (BID) | ORAL | 5 refills | Status: DC

## 2017-04-09 NOTE — Progress Notes (Signed)
BP 129/90   Pulse 89   Temp 98.4 F (36.9 C) (Oral)   Ht _0  (1.575 m)   Wt 146 lb 3.2 oz (66.3 kg)   BMI 26.74 kg/m    Subjective:    Patient ID: [name], female    DOB: 1955-09-04, 61 y.o.   MRN: [id number]  HPI: [name] is a 61 y.o. female presenting on 04/09/2017 for Annual Exam  This patient comes in for annual well physical examination. All medications are reviewed today. There are no reports of any problems with the medications. All of the medical conditions are reviewed and updated.  Lab work is reviewed and will be ordered as medically necessary. There are no new problems reported with today's visit.  Patient reports doing well overall.  I have noticed on her physical exam a very slight control of her head.  We have had a long conversation about this.  She has not had any family history that she knows of.  She has 1 cousin that has a tremor.  She did not know of any injury she has had to her head.  It is more pronounced whenever she is anxious like coming into a doctor's office.  At times it is very calm.  Relevant past medical, surgical, family and social history reviewed and updated as indicated. Allergies and medications reviewed and updated.  Past Medical History:  Diagnosis Date  . Cancer ([facility])    cervical 2003  . Vertigo     Past Surgical History:  Procedure Laterality Date  . ABDOMINAL HYSTERECTOMY    . CHOLECYSTECTOMY      Review of Systems  Constitutional: Negative.  Negative for activity change, fatigue and fever.  HENT: Negative.   Eyes: Negative.   Respiratory: Negative.  Negative for cough.   Cardiovascular: Negative.  Negative for chest pain.  Gastrointestinal: Negative.  Negative for abdominal pain.  Endocrine: Negative.   Genitourinary: Negative.  Negative for dysuria.  Musculoskeletal: Negative.   Skin: Negative.   Neurological: Positive for tremors.    Allergies as of 04/09/2017      Reactions   Percocet  [oxycodone-acetaminophen] Other (See Comments)   migraine       Medication List       Accurate as of 04/09/17  1:18 PM. Always use your most recent med list.          cetirizine 10 MG tablet Commonly known as:  ZYRTEC Take 10 mg by mouth daily.   citalopram 20 MG tablet Commonly known as:  CELEXA Take 1 tablet (20 mg total) by mouth daily.   fluticasone 50 MCG/ACT nasal spray Commonly known as:  FLONASE Place 1 spray into both nostrils 2 (two) times daily.   levothyroxine 25 MCG tablet Commonly known as:  SYNTHROID, LEVOTHROID TAKE 1 TABLET BY MOUTH ONCE DAILY BEFORE BREAKFAST   montelukast 10 MG tablet Commonly known as:  SINGULAIR Take 1 tablet (10 mg total) by mouth at bedtime.   primidone 50 MG tablet Commonly known as:  MYSOLINE Take 1 tablet (50 mg total) by mouth 2 (two) times daily.   simvastatin 20 MG tablet Commonly known as:  ZOCOR Take 1 tablet (20 mg total) by mouth daily at 6 PM.          Objective:    BP 129/90   Pulse 89   Temp 98.4 F (36.9 C) (Oral)   Ht _1  (1.575 m)   Wt 146 lb 3.2 oz (66.3  kg)   BMI 26.74 kg/m   Allergies  Allergen Reactions  . Percocet [Oxycodone-Acetaminophen] Other (See Comments)    migraine     Physical Exam  Constitutional: She is oriented to person, place, and time. She appears well-developed and well-nourished.  HENT:  Head: Normocephalic and atraumatic.  Right Ear: Tympanic membrane, external ear and ear canal normal.  Left Ear: Tympanic membrane, external ear and ear canal normal.  Nose: Nose normal. No rhinorrhea.  Mouth/Throat: Oropharynx is clear and moist and mucous membranes are normal. No oropharyngeal exudate or posterior oropharyngeal erythema.  Eyes: Pupils are equal, round, and reactive to light. Conjunctivae and EOM are normal.  Neck: Normal range of motion. Neck supple.  Cardiovascular: Normal rate, regular rhythm, normal heart sounds and intact distal pulses.   Pulmonary/Chest: Effort  normal and breath sounds normal.  Abdominal: Soft. Bowel sounds are normal.  Neurological: She is alert and oriented to person, place, and time. She has normal reflexes. She displays tremor.  Slight tremor of head is seen through exam  Skin: Skin is warm and dry. No rash noted.  Psychiatric: She has a normal mood and affect. Her behavior is normal. Judgment and thought content normal.  Nursing note and vitals reviewed.   Results for orders placed or performed in visit on 04/05/16  CMP14+EGFR  Result Value Ref Range   Glucose 108 (H) 65 - 99 mg/dL   BUN 16 6 - 24 mg/dL   Creatinine, Ser 1.02 (H) 0.57 - 1.00 mg/dL   GFR calc non Af Amer 60 >59 mL/min/1.73   GFR calc Af Amer 70 >59 mL/min/1.73   BUN/Creatinine Ratio 16 9 - 23   Sodium 145 (H) 134 - 144 mmol/L   Potassium 4.3 3.5 - 5.2 mmol/L   Chloride 104 96 - 106 mmol/L   CO2 24 18 - 29 mmol/L   Calcium 9.7 8.7 - 10.2 mg/dL   Total Protein 6.4 6.0 - 8.5 g/dL   Albumin 4.4 3.5 - 5.5 g/dL   Globulin, Total 2.0 1.5 - 4.5 g/dL   Albumin/Globulin Ratio 2.2 1.2 - 2.2   Bilirubin Total 0.3 0.0 - 1.2 mg/dL   Alkaline Phosphatase 47 39 - 117 IU/L   AST 19 0 - 40 IU/L   ALT 18 0 - 32 IU/L  Lipid panel  Result Value Ref Range   Cholesterol, Total 124 100 - 199 mg/dL   Triglycerides 87 0 - 149 mg/dL   HDL 49 >39 mg/dL   VLDL Cholesterol Cal 17 5 - 40 mg/dL   LDL Calculated 58 0 - 99 mg/dL   Chol/HDL Ratio 2.5 0.0 - 4.4 ratio units      Assessment & Plan:   1. Well adult exam - CMP14+EGFR - CBC with Differential/Platelet - Lipid panel - Thyroid Panel With TSH  2. Allergic rhinitis, unspecified seasonality, unspecified trigger - montelukast (SINGULAIR) 10 MG tablet; Take 1 tablet (10 mg total) by mouth at bedtime.  Dispense: 90 tablet; Refill: 3  3. Anxiety - citalopram (CELEXA) 20 MG tablet; Take 1 tablet (20 mg total) by mouth daily.  Dispense: 90 tablet; Refill: 3  4. Need for immunization against influenza - Flu Vaccine  QUAD 36+ mos IM  5. Essential tremor - primidone (MYSOLINE) 50 MG tablet; Take 1 tablet (50 mg total) by mouth 2 (two) times daily.  Dispense: 60 tablet; Refill: 5 Call if symptoms are not improved We will plan for titration of this medication.   Current Outpatient Prescriptions:  .  cetirizine (ZYRTEC) 10 MG tablet, Take 10 mg by mouth daily., Disp: , Rfl:  .  citalopram (CELEXA) 20 MG tablet, Take 1 tablet (20 mg total) by mouth daily., Disp: 90 tablet, Rfl: 3 .  fluticasone (FLONASE) 50 MCG/ACT nasal spray, Place 1 spray into both nostrils 2 (two) times daily., Disp: 48 g, Rfl: 3 .  levothyroxine (SYNTHROID, LEVOTHROID) 25 MCG tablet, TAKE 1 TABLET BY MOUTH ONCE DAILY BEFORE BREAKFAST, Disp: 90 tablet, Rfl: 0 .  montelukast (SINGULAIR) 10 MG tablet, Take 1 tablet (10 mg total) by mouth at bedtime., Disp: 90 tablet, Rfl: 3 .  simvastatin (ZOCOR) 20 MG tablet, Take 1 tablet (20 mg total) by mouth daily at 6 PM., Disp: 90 tablet, Rfl: 3 .  primidone (MYSOLINE) 50 MG tablet, Take 1 tablet (50 mg total) by mouth 2 (two) times daily., Disp: 60 tablet, Rfl: 5 Continue all other maintenance medications as listed above.  Follow up plan: Return in about 1 year (around 04/09/2018) for well.  Educational handout given for [place] PA-C [place] [address]  [place], [place] [address] [phone]   04/09/2017, 1:18 PM

## 2017-04-09 NOTE — Patient Instructions (Signed)
Essential Tremor A tremor is trembling or shaking that you cannot control. Most tremors affect the hands or arms. Tremors can also affect the head, vocal cords, face, and other parts of the body. Essential tremor is a tremor without a known cause. What are the causes? Essential tremor has no known cause. What increases the risk? You may be at greater risk of essential tremor if:  You have a family member with essential tremor.  You are age 61 or older.  You take certain medicines.  What are the signs or symptoms? The main sign of a tremor is uncontrolled and unintentional rhythmic shaking of a body part.  You may have difficulty eating with a spoon or fork.  You may have difficulty writing.  You may nod your head up and down or side to side.  You may have a quivering voice.  Your tremors:  May get worse over time.  May come and go.  May be more noticeable on one side of your body.  May get worse due to stress, fatigue, caffeine, and extreme heat or cold.  How is this diagnosed? Your health care provider can diagnose essential tremor based on your symptoms, medical history, and a physical examination. There is no single test to diagnose an essential tremor. However, your health care provider may perform a variety of tests to rule out other conditions. Tests may include:  Blood and urine tests.  Imaging studies of your brain, such as: ? CT scan. ? MRI.  A test that measures involuntary muscle movement (electromyogram).  How is this treated? Your tremors may go away without treatment. Mild tremors may not need treatment if they do not affect your day-to-day life. Severe tremors may need to be treated using one or a combination of the following options:  Medicines. This may include medicine that is injected.  Lifestyle changes.  Physical therapy.  Follow these instructions at home:  Take medicines only as directed by your health care provider.  Limit alcohol  intake to no more than 1 drink per day for nonpregnant women and 2 drinks per day for men. One drink equals 12 oz of beer, 5 oz of wine, or 1 oz of hard liquor.  Do not use any tobacco products, including cigarettes, chewing tobacco, or electronic cigarettes. If you need help quitting, ask your health care provider.  Take medicines only as directed by your health care provider.  Avoid extreme heat or cold.  Limit the amount of caffeine you consumeas directed by your health care provider.  Try to get eight hours of sleep each night.  Find ways to manage your stress, such as meditation or yoga.  Keep all follow-up visits as directed by your health care provider. This is important. This includes any physical therapy visits. Contact a health care provider if:  You experience any changes in the location or intensity of your tremors.  You start having a tremor after starting a new medicine.  You have tremor with other symptoms such as: ? Numbness. ? Tingling. ? Pain. ? Weakness.  Your tremor gets worse.  Your tremor interferes with your daily life. This information is not intended to replace advice given to you by your health care provider. Make sure you discuss any questions you have with your health care provider. Document Released: 06/24/2014 Document Revised: 11/09/2015 Document Reviewed: 11/29/2013 Elsevier Interactive Patient Education  2018 Elsevier Inc.  

## 2017-04-10 LAB — CMP14+EGFR
A/G RATIO: 2.4 — AB (ref 1.2–2.2)
ALK PHOS: 74 IU/L (ref 39–117)
ALT: 20 IU/L (ref 0–32)
AST: 16 IU/L (ref 0–40)
Albumin: 4.5 g/dL (ref 3.6–4.8)
BILIRUBIN TOTAL: 0.3 mg/dL (ref 0.0–1.2)
BUN/Creatinine Ratio: 12 (ref 12–28)
BUN: 11 mg/dL (ref 8–27)
CALCIUM: 10 mg/dL (ref 8.7–10.3)
CHLORIDE: 104 mmol/L (ref 96–106)
CO2: 27 mmol/L (ref 20–29)
Creatinine, Ser: 0.9 mg/dL (ref 0.57–1.00)
GFR calc Af Amer: 80 mL/min/{1.73_m2} (ref >59)
GFR, EST NON AFRICAN AMERICAN: 70 mL/min/{1.73_m2} (ref >59)
Globulin, Total: 1.9 g/dL (ref 1.5–4.5)
Glucose: 108 mg/dL — ABNORMAL HIGH (ref 65–99)
Potassium: 4.6 mmol/L (ref 3.5–5.2)
SODIUM: 141 mmol/L (ref 134–144)
Total Protein: 6.4 g/dL (ref 6.0–8.5)

## 2017-04-10 LAB — CBC WITH DIFFERENTIAL/PLATELET
Basophils Absolute: 0 10*3/uL (ref 0.0–0.2)
Basos: 1 %
EOS (ABSOLUTE): 0.1 10*3/uL (ref 0.0–0.4)
Eos: 1 %
Hematocrit: 42.4 % (ref 34.0–46.6)
Hemoglobin: 14.5 g/dL (ref 11.1–15.9)
IMMATURE GRANS (ABS): 0 10*3/uL (ref 0.0–0.1)
Immature Granulocytes: 0 %
Lymphocytes Absolute: 1.4 10*3/uL (ref 0.7–3.1)
Lymphs: 24 %
MCH: 32.5 pg (ref 26.6–33.0)
MCHC: 34.2 g/dL (ref 31.5–35.7)
MCV: 95 fL (ref 79–97)
MONOS ABS: 0.2 10*3/uL (ref 0.1–0.9)
Monocytes: 4 %
NEUTROS PCT: 70 %
Neutrophils Absolute: 4.3 10*3/uL (ref 1.4–7.0)
PLATELETS: 210 10*3/uL (ref 150–379)
RBC: 4.46 x10E6/uL (ref 3.77–5.28)
RDW: 13.2 % (ref 12.3–15.4)
WBC: 6 10*3/uL (ref 3.4–10.8)

## 2017-04-10 LAB — THYROID PANEL WITH TSH
Free Thyroxine Index: 1.5 (ref 1.2–4.9)
T3 Uptake Ratio: 23 % — ABNORMAL LOW (ref 24–39)
T4 TOTAL: 6.7 ug/dL (ref 4.5–12.0)
TSH: 2.05 u[IU]/mL (ref 0.450–4.500)

## 2017-04-10 LAB — LIPID PANEL
CHOL/HDL RATIO: 3 ratio (ref 0.0–4.4)
Cholesterol, Total: 136 mg/dL (ref 100–199)
HDL: 45 mg/dL (ref >39)
LDL CALC: 64 mg/dL (ref 0–99)
Triglycerides: 136 mg/dL (ref 0–149)
VLDL Cholesterol Cal: 27 mg/dL (ref 5–40)

## 2017-06-17 HISTORY — PX: OTHER SURGICAL HISTORY: SHX169

## 2017-07-29 ENCOUNTER — Other Ambulatory Visit: Payer: Self-pay | Admitting: Physician Assistant

## 2017-09-24 ENCOUNTER — Other Ambulatory Visit: Payer: Self-pay | Admitting: Physician Assistant

## 2017-09-24 DIAGNOSIS — G250 Essential tremor: Secondary | ICD-10-CM

## 2018-04-20 ENCOUNTER — Other Ambulatory Visit: Payer: Self-pay | Admitting: Physician Assistant

## 2018-04-20 DIAGNOSIS — G250 Essential tremor: Secondary | ICD-10-CM

## 2018-04-20 DIAGNOSIS — J309 Allergic rhinitis, unspecified: Secondary | ICD-10-CM

## 2018-04-20 DIAGNOSIS — F419 Anxiety disorder, unspecified: Secondary | ICD-10-CM

## 2018-04-20 NOTE — Telephone Encounter (Signed)
OV 05/06/18

## 2018-05-04 ENCOUNTER — Other Ambulatory Visit: Payer: Self-pay | Admitting: *Deleted

## 2018-05-06 ENCOUNTER — Ambulatory Visit (INDEPENDENT_AMBULATORY_CARE_PROVIDER_SITE_OTHER): Payer: 59 | Admitting: Physician Assistant

## 2018-05-06 ENCOUNTER — Ambulatory Visit (INDEPENDENT_AMBULATORY_CARE_PROVIDER_SITE_OTHER): Payer: 59

## 2018-05-06 ENCOUNTER — Encounter: Payer: Self-pay | Admitting: Physician Assistant

## 2018-05-06 VITALS — BP 125/78 | HR 78 | Ht 62.0 in | Wt 139.6 lb

## 2018-05-06 DIAGNOSIS — Z01419 Encounter for gynecological examination (general) (routine) without abnormal findings: Secondary | ICD-10-CM | POA: Diagnosis not present

## 2018-05-06 DIAGNOSIS — R519 Headache, unspecified: Secondary | ICD-10-CM | POA: Insufficient documentation

## 2018-05-06 DIAGNOSIS — M545 Low back pain, unspecified: Secondary | ICD-10-CM

## 2018-05-06 DIAGNOSIS — G250 Essential tremor: Secondary | ICD-10-CM

## 2018-05-06 DIAGNOSIS — F419 Anxiety disorder, unspecified: Secondary | ICD-10-CM

## 2018-05-06 DIAGNOSIS — R42 Dizziness and giddiness: Secondary | ICD-10-CM | POA: Insufficient documentation

## 2018-05-06 DIAGNOSIS — Z1211 Encounter for screening for malignant neoplasm of colon: Secondary | ICD-10-CM

## 2018-05-06 DIAGNOSIS — R252 Cramp and spasm: Secondary | ICD-10-CM | POA: Insufficient documentation

## 2018-05-06 DIAGNOSIS — J309 Allergic rhinitis, unspecified: Secondary | ICD-10-CM

## 2018-05-06 DIAGNOSIS — R51 Headache: Secondary | ICD-10-CM

## 2018-05-06 MED ORDER — CITALOPRAM HYDROBROMIDE 20 MG PO TABS: 20.0000 mg | ORAL_TABLET | Freq: Every day | ORAL | 3 refills | Status: DC

## 2018-05-06 MED ORDER — LEVOTHYROXINE SODIUM 25 MCG PO TABS: 25.0000 ug | ORAL_TABLET | Freq: Every day | ORAL | 3 refills | Status: DC

## 2018-05-06 MED ORDER — PRIMIDONE 50 MG PO TABS: 50.0000 mg | ORAL_TABLET | Freq: Two times a day (BID) | ORAL | 3 refills | Status: DC

## 2018-05-06 MED ORDER — FLUTICASONE PROPIONATE 50 MCG/ACT NA SUSP: 1.0000 | Freq: Two times a day (BID) | NASAL | 11 refills | Status: DC

## 2018-05-06 MED ORDER — NAPROXEN 500 MG PO TABS: 500.0000 mg | ORAL_TABLET | Freq: Two times a day (BID) | ORAL | 0 refills | Status: DC

## 2018-05-06 MED ORDER — SIMVASTATIN 20 MG PO TABS: 20.0000 mg | ORAL_TABLET | Freq: Every day | ORAL | 3 refills | Status: DC

## 2018-05-06 MED ORDER — MONTELUKAST SODIUM 10 MG PO TABS: 10.0000 mg | ORAL_TABLET | Freq: Every day | ORAL | 3 refills | Status: DC

## 2018-05-06 NOTE — Patient Instructions (Signed)

## 2018-05-07 LAB — LIPID PANEL
CHOL/HDL RATIO: 2.9 ratio (ref 0.0–4.4)
Cholesterol, Total: 161 mg/dL (ref 100–199)
HDL: 56 mg/dL (ref >39)
LDL Calculated: 90 mg/dL (ref 0–99)
TRIGLYCERIDES: 74 mg/dL (ref 0–149)
VLDL CHOLESTEROL CAL: 15 mg/dL (ref 5–40)

## 2018-05-07 LAB — CMP14+EGFR
ALT: 14 IU/L (ref 0–32)
AST: 17 IU/L (ref 0–40)
Albumin/Globulin Ratio: 3.2 — ABNORMAL HIGH (ref 1.2–2.2)
Albumin: 4.5 g/dL (ref 3.6–4.8)
Alkaline Phosphatase: 52 IU/L (ref 39–117)
BUN/Creatinine Ratio: 9 — ABNORMAL LOW (ref 12–28)
BUN: 9 mg/dL (ref 8–27)
Bilirubin Total: 0.2 mg/dL (ref 0.0–1.2)
CO2: 22 mmol/L (ref 20–29)
Calcium: 9.6 mg/dL (ref 8.7–10.3)
Chloride: 105 mmol/L (ref 96–106)
Creatinine, Ser: 0.99 mg/dL (ref 0.57–1.00)
GFR, EST AFRICAN AMERICAN: 71 mL/min/{1.73_m2} (ref >59)
GFR, EST NON AFRICAN AMERICAN: 61 mL/min/{1.73_m2} (ref >59)
GLUCOSE: 86 mg/dL (ref 65–99)
Globulin, Total: 1.4 g/dL — ABNORMAL LOW (ref 1.5–4.5)
Potassium: 4.5 mmol/L (ref 3.5–5.2)
Sodium: 142 mmol/L (ref 134–144)
Total Protein: 5.9 g/dL — ABNORMAL LOW (ref 6.0–8.5)

## 2018-05-07 LAB — CBC WITH DIFFERENTIAL/PLATELET
BASOS ABS: 0 10*3/uL (ref 0.0–0.2)
Basos: 1 %
EOS (ABSOLUTE): 0.1 10*3/uL (ref 0.0–0.4)
Eos: 1 %
HEMOGLOBIN: 13.9 g/dL (ref 11.1–15.9)
Hematocrit: 39.9 % (ref 34.0–46.6)
Immature Grans (Abs): 0 10*3/uL (ref 0.0–0.1)
Immature Granulocytes: 1 %
LYMPHS ABS: 1.3 10*3/uL (ref 0.7–3.1)
Lymphs: 19 %
MCH: 32.7 pg (ref 26.6–33.0)
MCHC: 34.8 g/dL (ref 31.5–35.7)
MCV: 94 fL (ref 79–97)
MONOCYTES: 6 %
Monocytes Absolute: 0.4 10*3/uL (ref 0.1–0.9)
Neutrophils Absolute: 4.7 10*3/uL (ref 1.4–7.0)
Neutrophils: 72 %
PLATELETS: 237 10*3/uL (ref 150–450)
RBC: 4.25 x10E6/uL (ref 3.77–5.28)
RDW: 12 % — ABNORMAL LOW (ref 12.3–15.4)
WBC: 6.5 10*3/uL (ref 3.4–10.8)

## 2018-05-07 LAB — MAGNESIUM: Magnesium: 2.1 mg/dL (ref 1.6–2.3)

## 2018-05-07 LAB — TSH: TSH: 1.25 u[IU]/mL (ref 0.450–4.500)

## 2018-05-08 DIAGNOSIS — G250 Essential tremor: Secondary | ICD-10-CM | POA: Insufficient documentation

## 2018-05-08 LAB — PAP IG W/ RFLX HPV ASCU

## 2018-05-08 NOTE — Progress Notes (Addendum)
BP 125/78   Pulse 78   Ht '5\' 2"'  (1.575 m)   Wt 139 lb 9.6 oz (63.3 kg)   BMI 25.53 kg/m    Subjective:    Patient ID: [name], female    DOB: [birth year], 62 y.o.   MRN: [id number]  HPI: [name] is a 62 y.o. female presenting on 05/06/2018 for Annual Exam  This patient comes in for annual well physical examination. All medications are reviewed today. There are no reports of any problems with the medications. All of the medical conditions are reviewed and updated.  Lab work is reviewed and will be ordered as medically necessary. There are no new problems reported with today's visit.  Patient reports doing well overall.   Past Medical History:  Diagnosis Date  . Cancer ([facility])    cervical 2003  . Vertigo    Relevant past medical, surgical, family and social history reviewed and updated as indicated. Interim medical history since our last visit reviewed. Allergies and medications reviewed and updated. DATA REVIEWED: CHART IN EPIC  Family History reviewed for pertinent findings.  Review of Systems  Constitutional: Negative.  Negative for activity change, fatigue and fever.  HENT: Negative.   Eyes: Negative.   Respiratory: Negative.  Negative for cough.   Cardiovascular: Negative.  Negative for chest pain.  Gastrointestinal: Negative.  Negative for abdominal pain.  Endocrine: Negative.   Genitourinary: Negative.  Negative for dysuria.  Musculoskeletal: Negative.   Skin: Negative.   Neurological: Negative.     Allergies as of 05/06/2018      Reactions   Percocet [oxycodone-acetaminophen] Other (See Comments)   migraine       Medication List        Accurate as of 05/06/18 11:59 PM. Always use your most recent med list.          cetirizine 10 MG tablet Commonly known as:  ZYRTEC Take 10 mg by mouth daily.   citalopram 20 MG tablet Commonly known as:  CELEXA Take 1 tablet (20 mg total) by mouth daily.   fluticasone 50 MCG/ACT nasal spray Commonly  known as:  FLONASE Place 1 spray into both nostrils 2 (two) times daily.   levothyroxine 25 MCG tablet Commonly known as:  SYNTHROID, LEVOTHROID Take 1 tablet (25 mcg total) by mouth daily.   montelukast 10 MG tablet Commonly known as:  SINGULAIR Take 1 tablet (10 mg total) by mouth at bedtime.   naproxen 500 MG tablet Commonly known as:  NAPROSYN Take 1 tablet (500 mg total) by mouth 2 (two) times daily with a meal.   primidone 50 MG tablet Commonly known as:  MYSOLINE Take 1 tablet (50 mg total) by mouth 2 (two) times daily.   simvastatin 20 MG tablet Commonly known as:  ZOCOR Take 1 tablet (20 mg total) by mouth daily at 6 PM.          Objective:    BP 125/78   Pulse 78   Ht '5\' 2"'  (1.575 m)   Wt 139 lb 9.6 oz (63.3 kg)   BMI 25.53 kg/m   Allergies  Allergen Reactions  . Percocet [Oxycodone-Acetaminophen] Other (See Comments)    migraine     Wt Readings from Last 3 Encounters:  05/06/18 139 lb 9.6 oz (63.3 kg)  04/09/17 146 lb 3.2 oz (66.3 kg)  08/06/16 140 lb 12.8 oz (63.9 kg)    Physical Exam  Constitutional: She is oriented to person, place, and time. She  appears well-developed and well-nourished.  HENT:  Head: Normocephalic and atraumatic.  Eyes: Pupils are equal, round, and reactive to light. Conjunctivae and EOM are normal.  Neck: Normal range of motion. Neck supple.  Cardiovascular: Normal rate, regular rhythm, normal heart sounds and intact distal pulses.  Pulmonary/Chest: Effort normal and breath sounds normal. Right breast exhibits no mass, no skin change and no tenderness. Left breast exhibits no mass, no skin change and no tenderness. No breast tenderness, discharge or bleeding. Breasts are symmetrical.  Abdominal: Soft. Bowel sounds are normal.  Genitourinary: Vagina normal. Rectal exam shows no fissure. No breast tenderness, discharge or bleeding. There is no rash, tenderness or lesion on the right labia. There is no rash, tenderness or lesion on  the left labia. Right adnexum displays no mass, no tenderness and no fullness. Left adnexum displays no mass, no tenderness and no fullness. No tenderness or bleeding in the vagina. No vaginal discharge found.  Genitourinary Comments: hysterectomy  Neurological: She is alert and oriented to person, place, and time. She has normal reflexes.  Skin: Skin is warm and dry. No rash noted.  Psychiatric: She has a normal mood and affect. Her behavior is normal. Judgment and thought content normal.    Results for orders placed or performed in visit on 05/06/18  CBC with Differential/Platelet  Result Value Ref Range   WBC 6.5 3.4 - 10.8 x10E3/uL   RBC 4.25 3.77 - 5.28 x10E6/uL   Hemoglobin 13.9 11.1 - 15.9 g/dL   Hematocrit 39.9 34.0 - 46.6 %   MCV 94 79 - 97 fL   MCH 32.7 26.6 - 33.0 pg   MCHC 34.8 31.5 - 35.7 g/dL   RDW 12.0 (L) 12.3 - 15.4 %   Platelets 237 150 - 450 x10E3/uL   Neutrophils 72 Not Estab. %   Lymphs 19 Not Estab. %   Monocytes 6 Not Estab. %   Eos 1 Not Estab. %   Basos 1 Not Estab. %   Neutrophils Absolute 4.7 1.4 - 7.0 x10E3/uL   Lymphocytes Absolute 1.3 0.7 - 3.1 x10E3/uL   Monocytes Absolute 0.4 0.1 - 0.9 x10E3/uL   EOS (ABSOLUTE) 0.1 0.0 - 0.4 x10E3/uL   Basophils Absolute 0.0 0.0 - 0.2 x10E3/uL   Immature Granulocytes 1 Not Estab. %   Immature Grans (Abs) 0.0 0.0 - 0.1 x10E3/uL  CMP14+EGFR  Result Value Ref Range   Glucose 86 65 - 99 mg/dL   BUN 9 8 - 27 mg/dL   Creatinine, Ser 0.99 0.57 - 1.00 mg/dL   GFR calc non Af Amer 61 >59 mL/min/1.73   GFR calc Af Amer 71 >59 mL/min/1.73   BUN/Creatinine Ratio 9 (L) 12 - 28   Sodium 142 134 - 144 mmol/L   Potassium 4.5 3.5 - 5.2 mmol/L   Chloride 105 96 - 106 mmol/L   CO2 22 20 - 29 mmol/L   Calcium 9.6 8.7 - 10.3 mg/dL   Total Protein 5.9 (L) 6.0 - 8.5 g/dL   Albumin 4.5 3.6 - 4.8 g/dL   Globulin, Total 1.4 (L) 1.5 - 4.5 g/dL   Albumin/Globulin Ratio 3.2 (H) 1.2 - 2.2   Bilirubin Total 0.2 0.0 - 1.2 mg/dL    Alkaline Phosphatase 52 39 - 117 IU/L   AST 17 0 - 40 IU/L   ALT 14 0 - 32 IU/L  Lipid panel  Result Value Ref Range   Cholesterol, Total 161 100 - 199 mg/dL   Triglycerides 74 0 - 149 mg/dL  HDL 56 >39 mg/dL   VLDL Cholesterol Cal 15 5 - 40 mg/dL   LDL Calculated 90 0 - 99 mg/dL   Chol/HDL Ratio 2.9 0.0 - 4.4 ratio  TSH  Result Value Ref Range   TSH 1.250 0.450 - 4.500 uIU/mL  Magnesium  Result Value Ref Range   Magnesium 2.1 1.6 - 2.3 mg/dL      Assessment & Plan:   1. Well female exam with routine gynecological exam - CBC with Differential/Platelet - CMP14+EGFR - Lipid panel - TSH - Magnesium - Pap IG w/ reflex to HPV when ASC-U  2. Allergic rhinitis, unspecified seasonality, unspecified trigger - montelukast (SINGULAIR) 10 MG tablet; Take 1 tablet (10 mg total) by mouth at bedtime.  Dispense: 90 tablet; Refill: 3  3. Chronic daily headache  4. Lumbar spine painful on movement - DG Lumbar Spine 2-3 Views; Future  5. Leg cramps - Magnesium  6. Vertigo  7. Essential tremor - primidone (MYSOLINE) 50 MG tablet; Take 1 tablet (50 mg total) by mouth 2 (two) times daily.  Dispense: 180 tablet; Refill: 3  8. Anxiety - citalopram (CELEXA) 20 MG tablet; Take 1 tablet (20 mg total) by mouth daily.  Dispense: 90 tablet; Refill: 3  9. Screening for colon cancer - Cologuard   Continue all other maintenance medications as listed above.  Follow up plan: No follow-ups on file.  Educational handout given for [place] PA-C [place] [address]  [place], [place] [address] [phone]   05/08/2018, 9:25 AM

## 2018-05-11 ENCOUNTER — Encounter: Payer: Self-pay | Admitting: Physician Assistant

## 2018-05-11 ENCOUNTER — Ambulatory Visit (INDEPENDENT_AMBULATORY_CARE_PROVIDER_SITE_OTHER): Payer: 59 | Admitting: Physician Assistant

## 2018-05-11 VITALS — BP 127/85 | HR 86 | Temp 97.2°F | Ht 62.0 in | Wt 137.2 lb

## 2018-05-11 DIAGNOSIS — N3001 Acute cystitis with hematuria: Secondary | ICD-10-CM | POA: Diagnosis not present

## 2018-05-11 DIAGNOSIS — R300 Dysuria: Secondary | ICD-10-CM | POA: Diagnosis not present

## 2018-05-11 LAB — URINALYSIS, COMPLETE
BILIRUBIN UA: NEGATIVE
GLUCOSE, UA: NEGATIVE
KETONES UA: NEGATIVE
NITRITE UA: NEGATIVE
Specific Gravity, UA: 1.01 (ref 1.005–1.030)
UUROB: 0.2 mg/dL (ref 0.2–1.0)
pH, UA: 6 (ref 5.0–7.5)

## 2018-05-11 LAB — MICROSCOPIC EXAMINATION

## 2018-05-11 MED ORDER — TOLTERODINE TARTRATE ER 4 MG PO CP24: 4.0000 mg | ORAL_CAPSULE | Freq: Every day | ORAL | 5 refills | Status: DC

## 2018-05-11 MED ORDER — CEPHALEXIN 500 MG PO CAPS: 500.0000 mg | ORAL_CAPSULE | Freq: Four times a day (QID) | ORAL | 0 refills | Status: DC

## 2018-05-11 NOTE — Progress Notes (Signed)
BP 127/85   Pulse 86   Temp (!) 97.2 F (36.2 C) (Oral)   Ht 5\' 2"  (1.575 m)   Wt 137 lb 3.2 oz (62.2 kg)   BMI 25.09 kg/m    Subjective:    Patient ID: [name], female    DOB: Oct 30, 1955, 62 y.o.   MRN: [id number]  HPI: [name] is a 62 y.o. female presenting on 05/11/2018 for Abdominal Pain (since Thursday); Diarrhea; and Nausea  This patient has had several days of dysuria, frequency and nocturia. There is also pain over the bladder in the suprapubic region, no back pain. Denies leakage or hematuria.  Denies fever or chills. No pain in flank area.   Past Medical History:  Diagnosis Date  . Cancer ([facility])    cervical 2003  . Vertigo    Relevant past medical, surgical, family and social history reviewed and updated as indicated. Interim medical history since our last visit reviewed. Allergies and medications reviewed and updated. DATA REVIEWED: CHART IN EPIC  Family History reviewed for pertinent findings.  Review of Systems  Constitutional: Negative.   HENT: Negative.   Eyes: Negative.   Respiratory: Negative.   Gastrointestinal: Negative.   Genitourinary: Positive for difficulty urinating, dysuria and urgency. Negative for flank pain.    Allergies as of 05/11/2018      Reactions   Percocet [oxycodone-acetaminophen] Other (See Comments)   migraine       Medication List        Accurate as of 05/11/18  9:58 PM. Always use your most recent med list.          cephALEXin 500 MG capsule Commonly known as:  KEFLEX Take 1 capsule (500 mg total) by mouth 4 (four) times daily.   cetirizine 10 MG tablet Commonly known as:  ZYRTEC Take 10 mg by mouth daily.   citalopram 20 MG tablet Commonly known as:  CELEXA Take 1 tablet (20 mg total) by mouth daily.   fluticasone 50 MCG/ACT nasal spray Commonly known as:  FLONASE Place 1 spray into both nostrils 2 (two) times daily.   levothyroxine 25 MCG tablet Commonly known as:  SYNTHROID,  LEVOTHROID Take 1 tablet (25 mcg total) by mouth daily.   montelukast 10 MG tablet Commonly known as:  SINGULAIR Take 1 tablet (10 mg total) by mouth at bedtime.   naproxen 500 MG tablet Commonly known as:  NAPROSYN Take 1 tablet (500 mg total) by mouth 2 (two) times daily with a meal.   primidone 50 MG tablet Commonly known as:  MYSOLINE Take 1 tablet (50 mg total) by mouth 2 (two) times daily.   simvastatin 20 MG tablet Commonly known as:  ZOCOR Take 1 tablet (20 mg total) by mouth daily at 6 PM.   tolterodine 4 MG 24 hr capsule Commonly known as:  DETROL LA Take 1 capsule (4 mg total) by mouth daily.          Objective:    BP 127/85   Pulse 86   Temp (!) 97.2 F (36.2 C) (Oral)   Ht 5\' 2"  (1.575 m)   Wt 137 lb 3.2 oz (62.2 kg)   BMI 25.09 kg/m   Allergies  Allergen Reactions  . Percocet [Oxycodone-Acetaminophen] Other (See Comments)    migraine     Wt Readings from Last 3 Encounters:  05/11/18 137 lb 3.2 oz (62.2 kg)  05/06/18 139 lb 9.6 oz (63.3 kg)  04/09/17 146 lb 3.2 oz (66.3 kg)  Physical Exam  Constitutional: She is oriented to person, place, and time. She appears well-developed and well-nourished.  HENT:  Head: Normocephalic and atraumatic.  Eyes: Pupils are equal, round, and reactive to light. Conjunctivae are normal.  Cardiovascular: Normal rate, regular rhythm, normal heart sounds and intact distal pulses.  Pulmonary/Chest: Effort normal and breath sounds normal.  Abdominal: Soft. Bowel sounds are normal. She exhibits no distension and no mass. There is tenderness in the suprapubic area. There is no rebound, no guarding and no CVA tenderness.  Neurological: She is alert and oriented to person, place, and time. She has normal reflexes.  Skin: Skin is warm and dry. No rash noted.  Psychiatric: She has a normal mood and affect. Her behavior is normal. Judgment and thought content normal.    Results for orders placed or performed in visit on  05/11/18  Microscopic Examination  Result Value Ref Range   WBC, UA >30 (A) 0 - 5 /hpf   RBC, UA 11-30 (A) 0 - 2 /hpf   Epithelial Cells (non renal) 0-10 0 - 10 /hpf   Renal Epithel, UA 0-10 (A) None seen /hpf   Bacteria, UA Many (A) None seen/Few  Urinalysis, Complete  Result Value Ref Range   Specific Gravity, UA 1.010 1.005 - 1.030   pH, UA 6.0 5.0 - 7.5   Color, UA Yellow Yellow   Appearance Ur Clear Clear   Leukocytes, UA 3+ (A) Negative   Protein, UA Trace (A) Negative/Trace   Glucose, UA Negative Negative   Ketones, UA Negative Negative   RBC, UA 2+ (A) Negative   Bilirubin, UA Negative Negative   Urobilinogen, Ur 0.2 0.2 - 1.0 mg/dL   Nitrite, UA Negative Negative   Microscopic Examination See below:       Assessment & Plan:   1. Dysuria - Urine Culture - Urinalysis, Complete - Microscopic Examination  2. Acute cystitis with hematuria - cephALEXin (KEFLEX) 500 MG capsule; Take 1 capsule (500 mg total) by mouth 4 (four) times daily.  Dispense: 40 capsule; Refill: 0 - tolterodine (DETROL LA) 4 MG 24 hr capsule; Take 1 capsule (4 mg total) by mouth daily.  Dispense: 30 capsule; Refill: 5   Continue all other maintenance medications as listed above.  Follow up plan: No follow-ups on file.  Educational handout given for uti  Terald Sleeper PA-C [place] [address]  [place], [place] [address] [phone]   05/11/2018, 9:58 PM

## 2018-05-13 ENCOUNTER — Telehealth: Payer: Self-pay | Admitting: Physician Assistant

## 2018-05-13 NOTE — Telephone Encounter (Signed)
Patient aware.

## 2018-05-13 NOTE — Telephone Encounter (Signed)
yes

## 2018-05-14 LAB — URINE CULTURE

## 2018-05-15 ENCOUNTER — Telehealth: Payer: Self-pay | Admitting: Physician Assistant

## 2018-05-15 MED ORDER — FLUCONAZOLE 150 MG PO TABS: 150.0000 mg | ORAL_TABLET | ORAL | 0 refills | Status: DC | PRN

## 2018-05-15 NOTE — Telephone Encounter (Signed)
Patient was prescribed keflex 05/11/18 for UTI.  Please advise.

## 2018-05-15 NOTE — Telephone Encounter (Signed)
Pt states that she is antibiotic and now has yeast infection wants the 2 stage medicine for yeast infection  Pharmacy [facility]

## 2018-05-15 NOTE — Telephone Encounter (Signed)
Prescription sent to pharmacy.

## 2018-05-25 ENCOUNTER — Ambulatory Visit (INDEPENDENT_AMBULATORY_CARE_PROVIDER_SITE_OTHER): Payer: 59 | Admitting: Physician Assistant

## 2018-05-25 ENCOUNTER — Encounter: Payer: Self-pay | Admitting: Physician Assistant

## 2018-05-25 VITALS — BP 131/84 | HR 64 | Temp 97.2°F | Ht 62.0 in | Wt 139.0 lb

## 2018-05-25 DIAGNOSIS — Z87440 Personal history of urinary (tract) infections: Secondary | ICD-10-CM | POA: Diagnosis not present

## 2018-05-25 DIAGNOSIS — R300 Dysuria: Secondary | ICD-10-CM

## 2018-05-25 DIAGNOSIS — N3001 Acute cystitis with hematuria: Secondary | ICD-10-CM

## 2018-05-25 DIAGNOSIS — Z8541 Personal history of malignant neoplasm of cervix uteri: Secondary | ICD-10-CM

## 2018-05-25 DIAGNOSIS — N939 Abnormal uterine and vaginal bleeding, unspecified: Secondary | ICD-10-CM

## 2018-05-25 DIAGNOSIS — Z90710 Acquired absence of both cervix and uterus: Secondary | ICD-10-CM

## 2018-05-25 DIAGNOSIS — N760 Acute vaginitis: Secondary | ICD-10-CM

## 2018-05-25 DIAGNOSIS — R102 Pelvic and perineal pain: Secondary | ICD-10-CM | POA: Diagnosis not present

## 2018-05-25 LAB — URINALYSIS, COMPLETE
Bilirubin, UA: NEGATIVE
GLUCOSE, UA: NEGATIVE
Ketones, UA: NEGATIVE
NITRITE UA: NEGATIVE
Protein, UA: NEGATIVE
Urobilinogen, Ur: 0.2 mg/dL (ref 0.2–1.0)
pH, UA: 6 (ref 5.0–7.5)

## 2018-05-25 LAB — WET PREP FOR TRICH, YEAST, CLUE
Clue Cell Exam: NEGATIVE
Trichomonas Exam: NEGATIVE
YEAST EXAM: NEGATIVE

## 2018-05-25 LAB — MICROSCOPIC EXAMINATION: RENAL EPITHEL UA: NONE SEEN /HPF

## 2018-05-25 MED ORDER — HYDROCODONE-ACETAMINOPHEN 5-325 MG PO TABS: 1.0000 | ORAL_TABLET | Freq: Four times a day (QID) | ORAL | 0 refills | Status: DC | PRN

## 2018-05-25 MED ORDER — CIPROFLOXACIN HCL 500 MG PO TABS: 500.0000 mg | ORAL_TABLET | Freq: Two times a day (BID) | ORAL | 0 refills | Status: DC

## 2018-05-25 NOTE — Progress Notes (Signed)
BP 131/84   Pulse 64   Temp (!) 97.2 F (36.2 C) (Oral)   Ht 5\' 2"  (1.575 m)   Wt 139 lb (63 kg)   BMI 25.42 kg/m    Subjective:    Patient ID: [name], female    DOB: [birth year], 62 y.o.   MRN: [id number]  HPI: [name] is a 62 y.o. female presenting on 05/25/2018 for Dysuria  Patient comes in today with symptoms of urinary tract infection again.  She is having burning and hurting at the urethra.  But she is also complaining of significant vaginal discharge that is bloody in color.  She has had a hysterectomy because of cervical cancer in 2003.  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  She was last evaluated by Dr. Diona Fanti several years ago.  She was found to have only 9% function. I she also is complaining of significant pelvic pain over the weekend.  It was bad enough she had to take something she had for pain.  It helped some.  She denies any fever or chills.  She is just greatly concerned because she did have a urinary tract infection just a couple weeks ago that did show some mildly resistant E. coli.  My concern is that there is some abnormal growth in the pelvis or around the bladder, or possibly a fistula.  We are to make a referral back to Dr. Diona Fanti.  In the meantime we are going to treat her for the abnormal urinalysis and the abnormal vaginal swab.  Both showed large amount of bacteria.  Culture will be sent for these.  Past Medical History:  Diagnosis Date  . Cancer ([place])    cervical 2003  . Vertigo    Relevant past medical, surgical, family and social history reviewed and updated as indicated. Interim medical history since our last visit reviewed. Allergies and medications reviewed and updated. DATA REVIEWED: CHART IN EPIC  Family History reviewed for pertinent findings.  Review of Systems  Constitutional: Negative.   HENT: Negative.   Eyes: Negative.   Respiratory: Negative.   Gastrointestinal: Negative.   Genitourinary:  Positive for difficulty urinating, dysuria, urgency, vaginal bleeding, vaginal discharge and vaginal pain. Negative for flank pain.    Allergies as of 05/25/2018      Reactions   Percocet [oxycodone-acetaminophen] Other (See Comments)   migraine       Medication List        Accurate as of 05/25/18  3:22 PM. Always use your most recent med list.          cetirizine 10 MG tablet Commonly known as:  ZYRTEC Take 10 mg by mouth daily.   ciprofloxacin 500 MG tablet Commonly known as:  CIPRO Take 1 tablet (500 mg total) by mouth 2 (two) times daily.   citalopram 20 MG tablet Commonly known as:  CELEXA Take 1 tablet (20 mg total) by mouth daily.   fluconazole 150 MG tablet Commonly known as:  DIFLUCAN Take 1 tablet (150 mg total) by mouth every three (3) days as needed.   fluticasone 50 MCG/ACT nasal spray Commonly known as:  FLONASE Place 1 spray into both nostrils 2 (two) times daily.   HYDROcodone-acetaminophen 5-325 MG tablet Commonly known as:  NORCO/VICODIN Take 1 tablet by mouth every 6 (six) hours as needed for moderate pain.   levothyroxine 25 MCG tablet Commonly known as:  SYNTHROID, LEVOTHROID Take 1 tablet (25 mcg total)  by mouth daily.   montelukast 10 MG tablet Commonly known as:  SINGULAIR Take 1 tablet (10 mg total) by mouth at bedtime.   naproxen 500 MG tablet Commonly known as:  NAPROSYN Take 1 tablet (500 mg total) by mouth 2 (two) times daily with a meal.   primidone 50 MG tablet Commonly known as:  MYSOLINE Take 1 tablet (50 mg total) by mouth 2 (two) times daily.   simvastatin 20 MG tablet Commonly known as:  ZOCOR Take 1 tablet (20 mg total) by mouth daily at 6 PM.   tolterodine 4 MG 24 hr capsule Commonly known as:  DETROL LA Take 1 capsule (4 mg total) by mouth daily.          Objective:    BP 131/84   Pulse 64   Temp (!) 97.2 F (36.2 C) (Oral)   Ht 5\' 2"  (1.575 m)   Wt 139 lb (63 kg)   BMI 25.42 kg/m   Allergies    Allergen Reactions  . Percocet [Oxycodone-Acetaminophen] Other (See Comments)    migraine     Wt Readings from Last 3 Encounters:  05/25/18 139 lb (63 kg)  05/11/18 137 lb 3.2 oz (62.2 kg)  05/06/18 139 lb 9.6 oz (63.3 kg)    Physical Exam  Constitutional: She is oriented to person, place, and time. She appears well-developed and well-nourished.  HENT:  Head: Normocephalic and atraumatic.  Eyes: Pupils are equal, round, and reactive to light. Conjunctivae are normal.  Cardiovascular: Normal rate, regular rhythm, normal heart sounds and intact distal pulses.  Pulmonary/Chest: Effort normal and breath sounds normal.  Abdominal: Soft. Bowel sounds are normal. She exhibits no distension and no mass. There is tenderness in the suprapubic area. There is no rebound, no guarding and no CVA tenderness.  Neurological: She is alert and oriented to person, place, and time. She has normal reflexes.  Skin: Skin is warm and dry. No rash noted.  Psychiatric: She has a normal mood and affect. Her behavior is normal. Judgment and thought content normal.    Results for orders placed or performed in visit on 05/11/18  Urine Culture  Result Value Ref Range   Urine Culture, Routine Final report (A)    Organism ID, Bacteria Escherichia coli (A)    Antimicrobial Susceptibility Comment   Microscopic Examination  Result Value Ref Range   WBC, UA >30 (A) 0 - 5 /hpf   RBC, UA 11-30 (A) 0 - 2 /hpf   Epithelial Cells (non renal) 0-10 0 - 10 /hpf   Renal Epithel, UA 0-10 (A) None seen /hpf   Bacteria, UA Many (A) None seen/Few  Urinalysis, Complete  Result Value Ref Range   Specific Gravity, UA 1.010 1.005 - 1.030   pH, UA 6.0 5.0 - 7.5   Color, UA Yellow Yellow   Appearance Ur Clear Clear   Leukocytes, UA 3+ (A) Negative   Protein, UA Trace (A) Negative/Trace   Glucose, UA Negative Negative   Ketones, UA Negative Negative   RBC, UA 2+ (A) Negative   Bilirubin, UA Negative Negative   Urobilinogen,  Ur 0.2 0.2 - 1.0 mg/dL   Nitrite, UA Negative Negative   Microscopic Examination See below:       Assessment & Plan:   1. Dysuria - Urine Culture - Urinalysis, Complete  2. Vaginal pain - WET PREP FOR TRICH, YEAST, CLUE  3. Vaginal bleeding - WET PREP FOR TRICH, YEAST, CLUE  4. Pelvic pain - WET  PREP FOR [place], YEAST, CLUE - Ambulatory referral to Urology  5. History of UTI - Ambulatory referral to Urology  6. History of cervical cancer - Ambulatory referral to Urology  7. Acute cystitis with hematuria - ciprofloxacin (CIPRO) 500 MG tablet; Take 1 tablet (500 mg total) by mouth 2 (two) times daily.  Dispense: 20 tablet; Refill: 0 - Ambulatory referral to Urology  8. Acute vaginitis - WET PREP FOR TRICH, YEAST, CLUE - ciprofloxacin (CIPRO) 500 MG tablet; Take 1 tablet (500 mg total) by mouth 2 (two) times daily.  Dispense: 20 tablet; Refill: 0 - Ambulatory referral to Urology  9. History of hysterectomy - Ambulatory referral to Urology   Continue all other maintenance medications as listed above.  Follow up plan: No follow-ups on file.  Educational handout given for [place] PA-C [place] [address]  [place], [place] [address] [phone]   05/25/2018, 3:22 PM   .

## 2018-05-27 LAB — URINE CULTURE

## 2018-06-02 ENCOUNTER — Other Ambulatory Visit: Payer: Self-pay | Admitting: Physician Assistant

## 2018-06-02 ENCOUNTER — Telehealth: Payer: Self-pay | Admitting: Physician Assistant

## 2018-06-02 DIAGNOSIS — N3001 Acute cystitis with hematuria: Secondary | ICD-10-CM

## 2018-06-02 DIAGNOSIS — N760 Acute vaginitis: Secondary | ICD-10-CM

## 2018-06-02 MED ORDER — FLUCONAZOLE 150 MG PO TABS: 150.0000 mg | ORAL_TABLET | ORAL | 0 refills | Status: DC | PRN

## 2018-06-02 MED ORDER — CIPROFLOXACIN HCL 500 MG PO TABS: 500.0000 mg | ORAL_TABLET | Freq: Two times a day (BID) | ORAL | 0 refills | Status: DC

## 2018-06-02 NOTE — Telephone Encounter (Signed)
Pt states she finishes the Cipro tomorrow but it is still burning and swollen and the skin is very irritated and painful, discharge still there. Pt denies skin breakdown, ulcerations or lesions. Pt would like refills on Cipro and diflucan and also a referral to urology Dr Dahltstedt at [facility] Please advise.

## 2018-06-02 NOTE — Telephone Encounter (Signed)
I placed the referral to Dr. Diona Fanti on December 9.  Can we check on that.  She may need to go sooner.  And I will send refills on the medication.

## 2018-06-02 NOTE — Telephone Encounter (Signed)
Patient aware.

## 2018-06-04 ENCOUNTER — Telehealth: Payer: Self-pay | Admitting: *Deleted

## 2018-06-04 NOTE — Telephone Encounter (Signed)
Incoming call from pt stating she is having worsening dysuria and hematuria Pt states she "has got to go somewhere" Call to [facility] Urology triage They will contact for appt today with oncall provider

## 2018-06-05 NOTE — Telephone Encounter (Signed)
Thanks for calling

## 2018-06-24 DIAGNOSIS — E782 Mixed hyperlipidemia: Secondary | ICD-10-CM | POA: Insufficient documentation

## 2018-06-24 DIAGNOSIS — F17200 Nicotine dependence, unspecified, uncomplicated: Secondary | ICD-10-CM | POA: Insufficient documentation

## 2018-06-24 DIAGNOSIS — E785 Hyperlipidemia, unspecified: Secondary | ICD-10-CM | POA: Insufficient documentation

## 2018-06-24 DIAGNOSIS — Z8541 Personal history of malignant neoplasm of cervix uteri: Secondary | ICD-10-CM | POA: Insufficient documentation

## 2018-06-24 DIAGNOSIS — E039 Hypothyroidism, unspecified: Secondary | ICD-10-CM | POA: Insufficient documentation

## 2018-07-21 ENCOUNTER — Emergency Department (HOSPITAL_COMMUNITY)
Admission: EM | Admit: 2018-07-21 | Discharge: 2018-07-22 | Disposition: A | Discharge status: Home / Self Care | Payer: Managed Care, Other (non HMO) | Attending: Emergency Medicine | Admitting: Emergency Medicine

## 2018-07-21 ENCOUNTER — Encounter (HOSPITAL_COMMUNITY): Payer: Self-pay

## 2018-07-21 DIAGNOSIS — N938 Other specified abnormal uterine and vaginal bleeding: Secondary | ICD-10-CM | POA: Insufficient documentation

## 2018-07-21 DIAGNOSIS — Z79899 Other long term (current) drug therapy: Secondary | ICD-10-CM | POA: Diagnosis not present

## 2018-07-21 DIAGNOSIS — N939 Abnormal uterine and vaginal bleeding, unspecified: Secondary | ICD-10-CM

## 2018-07-21 DIAGNOSIS — F17200 Nicotine dependence, unspecified, uncomplicated: Secondary | ICD-10-CM | POA: Diagnosis not present

## 2018-07-21 LAB — URINALYSIS, ROUTINE W REFLEX MICROSCOPIC
Bilirubin Urine: NEGATIVE
Glucose, UA: NEGATIVE mg/dL
Ketones, ur: NEGATIVE mg/dL
Nitrite: NEGATIVE
Protein, ur: 30 mg/dL — AB
RBC / HPF: >50 RBC/hpf — ABNORMAL HIGH (ref 0–5)
Specific Gravity, Urine: 1.008 (ref 1.005–1.030)
pH: 6 (ref 5.0–8.0)

## 2018-07-21 LAB — BASIC METABOLIC PANEL
Anion gap: 7 (ref 5–15)
BUN: 15 mg/dL (ref 8–23)
CO2: 25 mmol/L (ref 22–32)
CREATININE: 0.98 mg/dL (ref 0.44–1.00)
Calcium: 9.2 mg/dL (ref 8.9–10.3)
Chloride: 105 mmol/L (ref 98–111)
GFR calc Af Amer: >60 mL/min (ref >60)
GFR calc non Af Amer: >60 mL/min (ref >60)
GLUCOSE: 116 mg/dL — AB (ref 70–99)
Potassium: 3.6 mmol/L (ref 3.5–5.1)
Sodium: 137 mmol/L (ref 135–145)

## 2018-07-21 LAB — CBC WITH DIFFERENTIAL/PLATELET
Abs Immature Granulocytes: 0.02 10*3/uL (ref 0.00–0.07)
BASOS PCT: 0 %
Basophils Absolute: 0 10*3/uL (ref 0.0–0.1)
Eosinophils Absolute: 0.1 10*3/uL (ref 0.0–0.5)
Eosinophils Relative: 1 %
HCT: 39.1 % (ref 36.0–46.0)
Hemoglobin: 13.1 g/dL (ref 12.0–15.0)
Immature Granulocytes: 0 %
Lymphocytes Relative: 18 %
Lymphs Abs: 1.3 10*3/uL (ref 0.7–4.0)
MCH: 32.3 pg (ref 26.0–34.0)
MCHC: 33.5 g/dL (ref 30.0–36.0)
MCV: 96.5 fL (ref 80.0–100.0)
MONOS PCT: 5 %
Monocytes Absolute: 0.4 10*3/uL (ref 0.1–1.0)
Neutro Abs: 5.6 10*3/uL (ref 1.7–7.7)
Neutrophils Relative %: 76 %
PLATELETS: 189 10*3/uL (ref 150–400)
RBC: 4.05 MIL/uL (ref 3.87–5.11)
RDW: 12 % (ref 11.5–15.5)
WBC: 7.4 10*3/uL (ref 4.0–10.5)
nRBC: 0 % (ref 0.0–0.2)

## 2018-07-21 LAB — POC OCCULT BLOOD, ED: Fecal Occult Bld: NEGATIVE

## 2018-07-21 NOTE — ED Triage Notes (Signed)
Pt arrived stating she has vaginal bleeding. 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 Pt states she was told to come if she bleeds multiple pads in an hour. Pt unsure how much she is bleeding. States she is peeing blood as well. Reports mild cramping in her lower abdomen.

## 2018-07-21 NOTE — ED Provider Notes (Signed)
[facility] DEPT Provider Note   CSN: [id number] Arrival date & time: 07/21/18  1927     History   Chief Complaint Chief Complaint  Patient presents with  . Vaginal Bleeding    HPI [name] is a 63 y.o. female.  HPI  Patient is a 63 year old female with a history of cervical cancer (in remission), who presents to the emergency department today for evaluation of vaginal bleeding.  Dates that the bleeding started around 430.  She has been changing her pads every 20 minutes and they have been partially filled with bright red blood.  She is also Plast some clots.  She complains of some mild dull periumbilical abdominal pain that started after the bleeding began.  She also reports blood in her urine.    Of note, patient has a history of colovaginal fistula.  She actually had her initial consultation with Dr. Renelda Mom with [facility] general surgery team.  They discussed plan for surgery in the future.  She states upon arriving home she began to have the bleeding.  She contacted the office and was told to monitor her bleeding and if bleeding worsened she should go to the emergency department for evaluation.  Denies any bright red blood per rectum or dark and tarry stools.  She denies EtOH use.  She denies heavy NSAID use.  Reviewed records.  CT of the abdomen completed on 07/01/2018 shows subtle fistula identified between the midline vaginal cuff and inferior wall rectosigmoid junction as it passes cranial to the cuff.  Past Medical History:  Diagnosis Date  . Cancer ([place])    cervical 2003  . Vertigo     Patient Active Problem List   Diagnosis Date Noted  . Essential tremor 05/08/2018  . Chronic daily headache 05/06/2018  . Lumbar spine painful on movement 05/06/2018  . Vertigo 05/06/2018  . Leg cramps 05/06/2018  . Allergic rhinitis 04/09/2017  . Anxiety 04/09/2017  . Need for immunization against influenza 04/09/2017    . Well adult exam 04/09/2017    Past Surgical History:  Procedure Laterality Date  . ABDOMINAL HYSTERECTOMY    . CHOLECYSTECTOMY       OB History   No obstetric history on file.      Home Medications    Prior to Admission medications   Medication Sig Start Date End Date Taking? Authorizing Provider  acetaminophen (TYLENOL) 500 MG tablet Take 500 mg by mouth daily as needed for moderate pain.   Yes [provider]  citalopram (CELEXA) 20 MG tablet Take 1 tablet (20 mg total) by mouth daily. 05/06/18  Yes Terald Sleeper, PA-C  fluticasone (FLONASE) 50 MCG/ACT nasal spray Place 1 spray into both nostrils 2 (two) times daily. 05/06/18  Yes Terald Sleeper, PA-C  levothyroxine (SYNTHROID, LEVOTHROID) 25 MCG tablet Take 1 tablet (25 mcg total) by mouth daily. 05/06/18  Yes Terald Sleeper, PA-C  lidocaine (XYLOCAINE) 2 % jelly Apply 1 application topically daily as needed for pain. 06/30/18  Yes [provider]  montelukast (SINGULAIR) 10 MG tablet Take 1 tablet (10 mg total) by mouth at bedtime. 05/06/18  Yes Terald Sleeper, PA-C  primidone (MYSOLINE) 50 MG tablet Take 1 tablet (50 mg total) by mouth 2 (two) times daily. 05/06/18  Yes Terald Sleeper, PA-C  simvastatin (ZOCOR) 20 MG tablet Take 1 tablet (20 mg total) by mouth daily at 6 PM. 05/06/18  Yes Terald Sleeper, PA-C  ciprofloxacin (CIPRO) 500 MG tablet Take 1 tablet (500 mg total) by mouth 2 (two) times daily. Patient not taking: Reported on 07/21/2018 06/02/18   Terald Sleeper, PA-C  fluconazole (DIFLUCAN) 150 MG tablet Take 1 tablet (150 mg total) by mouth every three (3) days as needed. Patient not taking: Reported on 07/21/2018 06/02/18   Terald Sleeper, PA-C  HYDROcodone-acetaminophen (NORCO/VICODIN) 5-325 MG tablet Take 1 tablet by mouth every 6 (six) hours as needed for moderate pain. Patient not taking: Reported on 07/21/2018 05/25/18   Terald Sleeper, PA-C  naproxen (NAPROSYN) 500 MG tablet Take 1 tablet (500 mg  total) by mouth 2 (two) times daily with a meal. Patient not taking: Reported on 07/21/2018 05/06/18   Terald Sleeper, PA-C  tolterodine (DETROL LA) 4 MG 24 hr capsule Take 1 capsule (4 mg total) by mouth daily. Patient not taking: Reported on 07/21/2018 05/11/18   Terald Sleeper, PA-C    Family History No family history on file.  Social History Social History   Tobacco Use  . Smoking status: Current Some Day Smoker  . Smokeless tobacco: Never Used  Substance Use Topics  . Alcohol use: Yes  . Drug use: No     Allergies   Percocet [oxycodone-acetaminophen]   Review of Systems Review of Systems  Constitutional: Negative for fever.  HENT: Negative for congestion.   Respiratory: Negative for shortness of breath.   Cardiovascular: Negative for chest pain.  Gastrointestinal: Positive for abdominal pain (mild). Negative for blood in stool, constipation, diarrhea and vomiting.  Genitourinary: Positive for vaginal discharge. Negative for dysuria, frequency and vaginal bleeding.  Musculoskeletal: Negative for back pain.  Skin: Negative for rash.  Neurological: Positive for light-headedness (resolved).     Physical Exam Updated Vital Signs BP 140/84 (BP Location: Left Arm)   Pulse 67   Temp 98 F (36.7 C) (Oral)   Resp 18   Ht 5\' 2"  (1.575 m)   Wt 60.3 kg   SpO2 98%   BMI 24.33 kg/m   Physical Exam Vitals signs and nursing note reviewed.  Constitutional:      General: She is not in acute distress.    Appearance: She is well-developed.  HENT:     Head: Normocephalic and atraumatic.  Eyes:     Conjunctiva/sclera: Conjunctivae normal.  Neck:     Musculoskeletal: Neck supple.  Cardiovascular:     Rate and Rhythm: Normal rate and regular rhythm.     Heart sounds: No murmur.  Pulmonary:     Effort: Pulmonary effort is normal. No respiratory distress.     Breath sounds: Normal breath sounds.  Abdominal:     Palpations: Abdomen is soft.     Tenderness: There is no  abdominal tenderness.  Genitourinary:    Comments: Exam performed by Rodney Booze,  exam chaperoned Date: 07/22/2018 Pelvic exam: normal external genitalia without evidence of trauma. VULVA: normal appearing vulva with no masses, tenderness or lesion. SPECULUM exam: Cervix is surgically absent. Small amount of stool in the vaginal vault that appears to be draining from fistula. Stool is light brown. There is a mild amount of dark clotted blood in the vaginal vault. No bright red bleeding and no obvious lacerations, FB or areas of active bleeding observed. Mild amount of discomfort with speculum examination.  BIMANUAL: No significant TTP with bimanual examination.  RECTAL exam: No pain with rectal exam. Light brown stool noted. No bright red blood or melena. POC hemoccult obtained. Skin:  General: Skin is warm and dry.  Neurological:     Mental Status: She is alert.      ED Treatments / Results  Labs (all labs ordered are listed, but only abnormal results are displayed) Labs Reviewed  BASIC METABOLIC PANEL - Abnormal; Notable for the following components:      Result Value   Glucose, Bld 116 (*)    All other components within normal limits  URINALYSIS, ROUTINE W REFLEX MICROSCOPIC - Abnormal; Notable for the following components:   APPearance CLOUDY (*)    Hgb urine dipstick LARGE (*)    Protein, ur 30 (*)    Leukocytes, UA MODERATE (*)    RBC / HPF >50 (*)    Bacteria, UA MANY (*)    All other components within normal limits  URINE CULTURE  CBC WITH DIFFERENTIAL/PLATELET  CBC  POC OCCULT BLOOD, ED  POC OCCULT BLOOD, ED    EKG None  Radiology No results found.  Procedures Procedures (including critical care time)  Medications Ordered in ED Medications - No data to display   Initial Impression / Assessment and Plan / ED Course  I have reviewed the triage vital signs and the nursing notes.  Pertinent labs & imaging results that were available during my care  of the patient were reviewed by me and considered in my medical decision making (see chart for details).     Final Clinical Impressions(s) / ED Diagnoses   Final diagnoses:  Vaginal bleeding   Patient with history of cervical cancer status post radiation therapy and total hysterectomy with known colovaginal fistula presenting to the ED with vaginal bleeding beginning earlier today patient.  Vital stable, no hypotension.  No tachycardia.  Initial CBC shows a stable hemoglobin at 13.1.  BMP normal.  On vaginal exam, patient does have a mild amount of blood in the vault without observation of any obvious active bleeding or lacerations in the vagina.  There is a small amount of stool in the vault as well that appears to be light brown.  Rectal exam was completed as well and there was light brown stool noted.  No bright red blood or melena visualized.  Point-of-care Hemoccult was obtained which was negative for bleeding.  Lower suspicion of GI source of bleeding given this negative testing.  Fever vaginal bleeding as primary source.  Patient's abdomen is soft, she has minimal tenderness and no peritoneal signs.  At this time, feel that patient appears very stable.  Feel that if her hemoglobin remained stable after period of observation in the ED then she is likely safe for discharge with close follow-up with her OB/GYN.  At shift change, patient care was signed out to Janetta Hora, PA-C with plan to follow-up on repeat hemoglobin and if stable anticipate discharge.  Findings and plan were discussed with the patient who voices understanding.  Discussed return cautions with the patient as well.   All questions were answered.  ED Discharge Orders    None       Bishop Dublin 07/22/18 0015    Merrily Pew, MD 07/22/18 0120

## 2018-07-22 DIAGNOSIS — N938 Other specified abnormal uterine and vaginal bleeding: Secondary | ICD-10-CM | POA: Diagnosis not present

## 2018-07-22 LAB — CBC
HCT: 39.2 % (ref 36.0–46.0)
Hemoglobin: 13 g/dL (ref 12.0–15.0)
MCH: 32.3 pg (ref 26.0–34.0)
MCHC: 33.2 g/dL (ref 30.0–36.0)
MCV: 97.3 fL (ref 80.0–100.0)
PLATELETS: 180 10*3/uL (ref 150–400)
RBC: 4.03 MIL/uL (ref 3.87–5.11)
RDW: 12 % (ref 11.5–15.5)
WBC: 6.1 10*3/uL (ref 4.0–10.5)
nRBC: 0 % (ref 0.0–0.2)

## 2018-07-22 NOTE — Discharge Instructions (Signed)
Please contact your OB/GYN tomorrow morning to make an appointment for follow-up for further evaluation.  Please also contact your general surgeon in regards to your visit today to inform them of the results.  Please monitor your symptoms.  If you continue to bleed more than 1 pad per hour for 3 hours then please return to the emergency department immediately.  Please also return to the emergency department if you have any new or worsening symptoms.

## 2018-07-23 LAB — URINE CULTURE: Culture: NO GROWTH

## 2018-07-31 ENCOUNTER — Other Ambulatory Visit: Payer: Self-pay | Admitting: Physician Assistant

## 2018-07-31 ENCOUNTER — Telehealth: Payer: Self-pay | Admitting: Physician Assistant

## 2018-07-31 MED ORDER — AMOXICILLIN-POT CLAVULANATE 875-125 MG PO TABS: 1.0000 | ORAL_TABLET | Freq: Two times a day (BID) | ORAL | 0 refills | Status: DC

## 2018-07-31 MED ORDER — FLUCONAZOLE 150 MG PO TABS: 150.0000 mg | ORAL_TABLET | ORAL | 0 refills | Status: DC | PRN

## 2018-07-31 NOTE — Telephone Encounter (Signed)
Will you just let her know med sent and whatever she needs to relay about the surgery.

## 2018-07-31 NOTE — Telephone Encounter (Signed)
Patient aware. She will have colonoscopy on 24th and she does have colovaginal fistula. She wanted to make sure that you knew and I advised her that you were receiving the notes.

## 2018-08-19 ENCOUNTER — Other Ambulatory Visit: Payer: Self-pay | Admitting: Physician Assistant

## 2018-08-19 MED ORDER — CIPROFLOXACIN HCL 500 MG PO TABS: 500.0000 mg | ORAL_TABLET | Freq: Two times a day (BID) | ORAL | 0 refills | Status: DC

## 2018-08-19 MED ORDER — HYDROCODONE-ACETAMINOPHEN 10-325 MG PO TABS: 1.0000 | ORAL_TABLET | Freq: Four times a day (QID) | ORAL | 0 refills | Status: DC | PRN

## 2018-08-19 NOTE — Progress Notes (Signed)
Is still awaiting fistula surgery.  She tried to have a colonoscopy which was ordered by the surgeon but when the gastroenterologist got in there she had a blockage and they did not want to go any further to create more problems.  So she is still waiting for the surgery date for the surgery.  She is having another infection.  Her last culture showed E. coli it was sensitive to Cipro.  We will send the medication in. She also is having severe pain with this, 7 day supply of pain medication sent.  Spoke with the patient myself.

## 2018-10-05 DIAGNOSIS — N824 Other female intestinal-genital tract fistulae: Secondary | ICD-10-CM | POA: Insufficient documentation

## 2018-10-16 ENCOUNTER — Encounter: Payer: Self-pay | Admitting: Nurse Practitioner

## 2018-10-16 ENCOUNTER — Other Ambulatory Visit: Payer: Self-pay

## 2018-10-16 ENCOUNTER — Ambulatory Visit (INDEPENDENT_AMBULATORY_CARE_PROVIDER_SITE_OTHER): Payer: 59 | Admitting: Nurse Practitioner

## 2018-10-16 DIAGNOSIS — N760 Acute vaginitis: Secondary | ICD-10-CM

## 2018-10-16 DIAGNOSIS — N3001 Acute cystitis with hematuria: Secondary | ICD-10-CM

## 2018-10-16 MED ORDER — CIPROFLOXACIN HCL 500 MG PO TABS: 500.0000 mg | ORAL_TABLET | Freq: Two times a day (BID) | ORAL | 0 refills | Status: DC

## 2018-10-16 MED ORDER — FLUCONAZOLE 150 MG PO TABS: 150.0000 mg | ORAL_TABLET | Freq: Once | ORAL | 3 refills | Status: AC

## 2018-10-16 NOTE — Progress Notes (Signed)
   Virtual Visit via telephone Note  I connected with [name] on 10/16/18 at 11:40AM by telephone and verified that I am speaking with the correct person using two identifiers. [name] is currently located at home and no one is currently with her during visit. The provider, [name] Done, FNP is located in their office at time of visit.  I discussed the limitations, risks, security and privacy concerns of performing an evaluation and management service by telephone and the availability of in person appointments. I also discussed with the patient that there may be a patient responsible charge related to this service. The patient expressed understanding and agreed to proceed.   History and Present Illness:   Chief Complaint: Dysuria   HPI Patient calls in today c/o dysuria, with frequency and urgency. Slight body aches. Denies abdominal pain or back pain. She also has a colovaginal fistula. She also has vaginal itching with discharge.    Review of Systems  Constitutional: Negative.   Respiratory: Negative.   Cardiovascular: Negative.   Genitourinary: Positive for dysuria, frequency and urgency. Negative for flank pain and hematuria.       Vaginal discharge Vaginal itching  Neurological: Negative.   Psychiatric/Behavioral: Negative.   All other systems reviewed and are negative.    Observations/Objective: Alert and oriented No distress  Assessment and Plan: [name] in today with chief complaint of Dysuria   1. Acute cystitis with hematuria Take medication as prescribe Cotton underwear Take shower not bath Cranberry juice, yogurt Force fluids AZO over the counter X2 days RTO prn  - ciprofloxacin (CIPRO) 500 MG tablet; Take 1 tablet (500 mg total) by mouth 2 (two) times daily.  Dispense: 20 tablet; Refill: 0  2. Acute vaginitis Avoid bubble baths Proper hygiene - fluconazole (DIFLUCAN) 150 MG tablet; Take 1 tablet (150 mg total) by mouth once  for 1 dose.  Dispense: 1 tablet; Refill: 3   Follow Up Instructions: prn    I discussed the assessment and treatment plan with the patient. The patient was provided an opportunity to ask questions and all were answered. The patient agreed with the plan and demonstrated an understanding of the instructions.   The patient was advised to call back or seek an in-person evaluation if the symptoms worsen or if the condition fails to improve as anticipated.  The above assessment and management plan was discussed with the patient. The patient verbalized understanding of and has agreed to the management plan. Patient is aware to call the clinic if symptoms persist or worsen. Patient is aware when to return to the clinic for a follow-up visit. Patient educated on when it is appropriate to go to the emergency department.   Time call ended:  11:49  I provided 10 minutes of non-face-to-face time during this encounter.    [name] Done, FNP

## 2018-11-17 ENCOUNTER — Telehealth: Payer: Self-pay | Admitting: Physician Assistant

## 2018-11-17 NOTE — Telephone Encounter (Signed)
fyi

## 2018-11-17 NOTE — Telephone Encounter (Signed)
noted 

## 2018-11-23 HISTORY — PX: LOW ANTERIOR BOWEL RESECTION: SUR1240

## 2018-11-23 HISTORY — PX: ILEOSTOMY: SHX1783

## 2018-12-30 ENCOUNTER — Telehealth: Payer: Self-pay | Admitting: Physician Assistant

## 2018-12-30 ENCOUNTER — Other Ambulatory Visit: Payer: Self-pay | Admitting: Physician Assistant

## 2018-12-30 DIAGNOSIS — N3001 Acute cystitis with hematuria: Secondary | ICD-10-CM

## 2018-12-30 MED ORDER — CIPROFLOXACIN HCL 500 MG PO TABS: 500.0000 mg | ORAL_TABLET | Freq: Two times a day (BID) | ORAL | 0 refills | Status: DC

## 2018-12-30 NOTE — Telephone Encounter (Signed)
I will send, has she had her surgery yet to repair the fistula?

## 2018-12-30 NOTE — Telephone Encounter (Signed)
Patient aware. Yes she had surgery June 8th.

## 2018-12-30 NOTE — Telephone Encounter (Signed)
Patient is complaining with lower back pain, odor to urine and urine is dark. Please review

## 2019-01-26 ENCOUNTER — Ambulatory Visit (INDEPENDENT_AMBULATORY_CARE_PROVIDER_SITE_OTHER): Payer: 59 | Admitting: Physician Assistant

## 2019-01-26 ENCOUNTER — Encounter: Payer: Self-pay | Admitting: Physician Assistant

## 2019-01-26 VITALS — BP 106/68 | HR 68

## 2019-01-26 DIAGNOSIS — L309 Dermatitis, unspecified: Secondary | ICD-10-CM

## 2019-01-26 DIAGNOSIS — L299 Pruritus, unspecified: Secondary | ICD-10-CM | POA: Diagnosis not present

## 2019-01-26 MED ORDER — LORATADINE 10 MG PO TABS: 10.0000 mg | ORAL_TABLET | Freq: Every day | ORAL | 3 refills | Status: DC

## 2019-01-26 MED ORDER — BETAMETHASONE DIPROPIONATE AUG 0.05 % EX CREA: TOPICAL_CREAM | Freq: Two times a day (BID) | CUTANEOUS | 5 refills | Status: DC

## 2019-01-26 MED ORDER — PREDNISONE 10 MG (48) PO TBPK: ORAL_TABLET | ORAL | 0 refills | Status: DC

## 2019-01-26 NOTE — Progress Notes (Signed)
Telephone visit  Subjective: WU:[name] skin PCP: Terald Sleeper, PA-C NWG:NFAO [name] is a 63 y.o. female calls for telephone consult today. Patient provides verbal consent for consult held via phone.  Patient is identified with 2 separate identifiers.  At this time the entire area is on COVID-19 social distancing and stay home orders are in place.  Patient is of higher risk and therefore we are performing this by a virtual method.  Location of patient: home Location of provider: HOME Others present for call: no  Patient is having a great deal of difficulty with itching on her back.  It is been going on for several weeks.  She does not know of anything new that she has come into contact with.  She did have a colonoscopy performed.  Few months ago she did have some surgery performed.  She is healing somewhat from it.  There is a little bit of complication.  Shows she is still under the care of her surgeon at [facility].  At this time her back which constantly.  No lotion that she can apply has helped.  There are just small pink papules reported by her husband to her.  She is not having it really any other place.   ROS: Per HPI  Allergies  Allergen Reactions  . Percocet [Oxycodone-Acetaminophen] Other (See Comments)    migraine    Past Medical History:  Diagnosis Date  . Cancer ([place])    cervical 2003  . Vertigo     Current Outpatient Medications:  .  acetaminophen (TYLENOL) 500 MG tablet, Take 500 mg by mouth daily as needed for moderate pain., Disp: , Rfl:  .  augmented betamethasone dipropionate (DIPROLENE-AF) 0.05 % cream, Apply topically 2 (two) times daily., Disp: 50 g, Rfl: 5 .  citalopram (CELEXA) 20 MG tablet, Take 1 tablet (20 mg total) by mouth daily., Disp: 90 tablet, Rfl: 3 .  fluticasone (FLONASE) 50 MCG/ACT nasal spray, Place 1 spray into both nostrils 2 (two) times daily., Disp: 48 g, Rfl: 11 .  levothyroxine (SYNTHROID, LEVOTHROID) 25 MCG  tablet, Take 1 tablet (25 mcg total) by mouth daily., Disp: 90 tablet, Rfl: 3 .  lidocaine (XYLOCAINE) 2 % jelly, Apply 1 application topically daily as needed for pain., Disp: , Rfl:  .  loratadine (CLARITIN) 10 MG tablet, Take 1 tablet (10 mg total) by mouth daily., Disp: 90 tablet, Rfl: 3 .  montelukast (SINGULAIR) 10 MG tablet, Take 1 tablet (10 mg total) by mouth at bedtime., Disp: 90 tablet, Rfl: 3 .  predniSONE (STERAPRED UNI-PAK 48 TAB) 10 MG (48) TBPK tablet, Take as directed for 12 days, Disp: 48 tablet, Rfl: 0 .  primidone (MYSOLINE) 50 MG tablet, Take 1 tablet (50 mg total) by mouth 2 (two) times daily., Disp: 180 tablet, Rfl: 3 .  simvastatin (ZOCOR) 20 MG tablet, Take 1 tablet (20 mg total) by mouth daily at 6 PM., Disp: 90 tablet, Rfl: 3  Assessment/ Plan: 63 y.o. female   1. Dermatitis - predniSONE (STERAPRED UNI-PAK 48 TAB) 10 MG (48) TBPK tablet; Take as directed for 12 days  Dispense: 48 tablet; Refill: 0 - augmented betamethasone dipropionate (DIPROLENE-AF) 0.05 % cream; Apply topically 2 (two) times daily.  Dispense: 50 g; Refill: 5 - loratadine (CLARITIN) 10 MG tablet; Take 1 tablet (10 mg total) by mouth daily.  Dispense: 90 tablet; Refill: 3  2. Pruritus - predniSONE (STERAPRED UNI-PAK 48 TAB) 10 MG (48) TBPK tablet; Take  as directed for 12 days  Dispense: 48 tablet; Refill: 0 - augmented betamethasone dipropionate (DIPROLENE-AF) 0.05 % cream; Apply topically 2 (two) times daily.  Dispense: 50 g; Refill: 5 - loratadine (CLARITIN) 10 MG tablet; Take 1 tablet (10 mg total) by mouth daily.  Dispense: 90 tablet; Refill: 3   No follow-ups on file.  Continue all other maintenance medications as listed above.  Start time: 8:25 AM End time: 8:40 AM  Meds ordered this encounter  Medications  . predniSONE (STERAPRED UNI-PAK 48 TAB) 10 MG (48) TBPK tablet    Sig: Take as directed for 12 days    Dispense:  48 tablet    Refill:  0    Order Specific Question:    Supervising Provider    Answer:   Janora Norlander [[id number]]  . augmented betamethasone dipropionate (DIPROLENE-AF) 0.05 % cream    Sig: Apply topically 2 (two) times daily.    Dispense:  50 g    Refill:  5    Order Specific Question:   Supervising Provider    Answer:   Janora Norlander [[id number]]  . loratadine (CLARITIN) 10 MG tablet    Sig: Take 1 tablet (10 mg total) by mouth daily.    Dispense:  90 tablet    Refill:  3    Order Specific Question:   Supervising Provider    Answer:   Janora Norlander [[id number]]    Particia Nearing PA-C [place] [phone]

## 2019-02-16 ENCOUNTER — Other Ambulatory Visit: Payer: Self-pay | Admitting: *Deleted

## 2019-02-16 DIAGNOSIS — G250 Essential tremor: Secondary | ICD-10-CM

## 2019-02-16 MED ORDER — PRIMIDONE 50 MG PO TABS: 50.0000 mg | ORAL_TABLET | Freq: Two times a day (BID) | ORAL | 0 refills | Status: DC

## 2019-02-24 ENCOUNTER — Other Ambulatory Visit: Payer: Self-pay | Admitting: *Deleted

## 2019-02-24 DIAGNOSIS — F419 Anxiety disorder, unspecified: Secondary | ICD-10-CM

## 2019-02-24 MED ORDER — CITALOPRAM HYDROBROMIDE 20 MG PO TABS: 20.0000 mg | ORAL_TABLET | Freq: Every day | ORAL | 0 refills | Status: DC

## 2019-03-17 ENCOUNTER — Ambulatory Visit (INDEPENDENT_AMBULATORY_CARE_PROVIDER_SITE_OTHER): Payer: 59 | Admitting: Physician Assistant

## 2019-03-17 ENCOUNTER — Encounter: Payer: Self-pay | Admitting: Physician Assistant

## 2019-03-17 DIAGNOSIS — J309 Allergic rhinitis, unspecified: Secondary | ICD-10-CM | POA: Diagnosis not present

## 2019-03-17 DIAGNOSIS — L309 Dermatitis, unspecified: Secondary | ICD-10-CM

## 2019-03-17 DIAGNOSIS — G250 Essential tremor: Secondary | ICD-10-CM

## 2019-03-17 DIAGNOSIS — F419 Anxiety disorder, unspecified: Secondary | ICD-10-CM

## 2019-03-17 DIAGNOSIS — Z0000 Encounter for general adult medical examination without abnormal findings: Secondary | ICD-10-CM

## 2019-03-17 DIAGNOSIS — L299 Pruritus, unspecified: Secondary | ICD-10-CM

## 2019-03-17 MED ORDER — PREDNISONE 10 MG (48) PO TBPK: ORAL_TABLET | ORAL | 0 refills | Status: DC

## 2019-03-17 MED ORDER — FLUTICASONE PROPIONATE 50 MCG/ACT NA SUSP: 1.0000 | Freq: Two times a day (BID) | NASAL | 11 refills | Status: DC

## 2019-03-17 MED ORDER — LEVOTHYROXINE SODIUM 25 MCG PO TABS: 25.0000 ug | ORAL_TABLET | Freq: Every day | ORAL | 3 refills | Status: DC

## 2019-03-17 MED ORDER — CITALOPRAM HYDROBROMIDE 20 MG PO TABS: 20.0000 mg | ORAL_TABLET | Freq: Every day | ORAL | 3 refills | Status: DC

## 2019-03-17 MED ORDER — SIMVASTATIN 20 MG PO TABS: 20.0000 mg | ORAL_TABLET | Freq: Every day | ORAL | 3 refills | Status: DC

## 2019-03-17 MED ORDER — PRIMIDONE 50 MG PO TABS: 50.0000 mg | ORAL_TABLET | Freq: Two times a day (BID) | ORAL | 3 refills | Status: DC

## 2019-03-17 MED ORDER — MONTELUKAST SODIUM 10 MG PO TABS: 10.0000 mg | ORAL_TABLET | Freq: Every day | ORAL | 3 refills | Status: DC

## 2019-03-17 NOTE — Progress Notes (Signed)
Telephone visit  Subjective: TT:SVXBL on medical condiitons PCP: Terald Sleeper, PA-C TJQ:ZESP [name] is a 63 y.o. female calls for telephone consult today. Patient provides verbal consent for consult held via phone.  Patient is identified with 2 separate identifiers.  At this time the entire area is on COVID-19 social distancing and stay home orders are in place.  Patient is of higher risk and therefore we are performing this by a virtual method.  Location of patient: home Location of provider: [facility] Others present for call: no  This patient is having a recheck on her chronic medical conditions. She does need her labs performed and refills on her regular medications.  She is having a reaction to the barium again, hives on her back and arms.  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  She had it happen again and prednisone worked in the past.    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 They will be doing a scope soon.    She is doing well overall with her other conditions.she does need her annual labs and medications refill.   ROS: Per HPI  Allergies  Allergen Reactions   Barium-Containing Compounds     hives   Percocet [Oxycodone-Acetaminophen] Other (See Comments)    migraine    Past Medical History:  Diagnosis Date   Cancer ([place])    cervical 2003   Vertigo     Current Outpatient Medications:    acetaminophen (TYLENOL) 500 MG tablet, Take 500 mg by mouth daily as needed for moderate pain., Disp: , Rfl:    augmented betamethasone dipropionate (DIPROLENE-AF) 0.05 % cream, Apply topically 2 (two) times daily., Disp: 50 g, Rfl: 5   citalopram (CELEXA) 20 MG tablet, Take 1 tablet (20 mg total) by mouth daily. (Needs to be seen before next refill), Disp: 90 tablet, Rfl: 3   fluticasone (FLONASE) 50 MCG/ACT nasal spray, Place 1 spray into both nostrils 2 (two) times daily., Disp: 48 g, Rfl: 11   levothyroxine (SYNTHROID)  25 MCG tablet, Take 1 tablet (25 mcg total) by mouth daily., Disp: 90 tablet, Rfl: 3   lidocaine (XYLOCAINE) 2 % jelly, Apply 1 application topically daily as needed for pain., Disp: , Rfl:    loratadine (CLARITIN) 10 MG tablet, Take 1 tablet (10 mg total) by mouth daily., Disp: 90 tablet, Rfl: 3   montelukast (SINGULAIR) 10 MG tablet, Take 1 tablet (10 mg total) by mouth at bedtime., Disp: 90 tablet, Rfl: 3   predniSONE (STERAPRED UNI-PAK 48 TAB) 10 MG (48) TBPK tablet, Take as directed for 12 days, Disp: 48 tablet, Rfl: 0   primidone (MYSOLINE) 50 MG tablet, Take 1 tablet (50 mg total) by mouth 2 (two) times daily. (Needs to be seen before next refill), Disp: 180 tablet, Rfl: 3   simvastatin (ZOCOR) 20 MG tablet, Take 1 tablet (20 mg total) by mouth daily at 6 PM., Disp: 90 tablet, Rfl: 3  Assessment/ Plan: 62 y.o. female   1. Anxiety - citalopram (CELEXA) 20 MG tablet; Take 1 tablet (20 mg total) by mouth daily. (Needs to be seen before next refill)  Dispense: 90 tablet; Refill: 3  2. Allergic rhinitis, unspecified seasonality, unspecified trigger - montelukast (SINGULAIR) 10 MG tablet; Take 1 tablet (10 mg total) by mouth at bedtime.  Dispense: 90 tablet; Refill: 3  3. Essential tremor - primidone (MYSOLINE) 50 MG tablet; Take 1 tablet (50 mg total)  by mouth 2 (two) times daily. (Needs to be seen before next refill)  Dispense: 180 tablet; Refill: 3  4. Dermatitis - predniSONE (STERAPRED UNI-PAK 48 TAB) 10 MG (48) TBPK tablet; Take as directed for 12 days  Dispense: 48 tablet; Refill: 0  5. Pruritus - predniSONE (STERAPRED UNI-PAK 48 TAB) 10 MG (48) TBPK tablet; Take as directed for 12 days  Dispense: 48 tablet; Refill: 0  6. Well adult exam - CBC with Differential/Platelet; Future - CMP14+EGFR; Future - Lipid panel; Future - TSH; Future   Return if symptoms worsen or fail to improve.  Continue all other maintenance medications as listed above.  Start time: 7:58 AM End  time: 8:14 AM  Meds ordered this encounter  Medications   citalopram (CELEXA) 20 MG tablet    Sig: Take 1 tablet (20 mg total) by mouth daily. (Needs to be seen before next refill)    Dispense:  90 tablet    Refill:  3    Order Specific Question:   Supervising Provider    Answer:   Janora Norlander [[id number]]   levothyroxine (SYNTHROID) 25 MCG tablet    Sig: Take 1 tablet (25 mcg total) by mouth daily.    Dispense:  90 tablet    Refill:  3    Order Specific Question:   Supervising Provider    Answer:   Janora Norlander [[id number]]   fluticasone (FLONASE) 50 MCG/ACT nasal spray    Sig: Place 1 spray into both nostrils 2 (two) times daily.    Dispense:  48 g    Refill:  11    Order Specific Question:   Supervising Provider    Answer:   Janora Norlander [[id number]]   montelukast (SINGULAIR) 10 MG tablet    Sig: Take 1 tablet (10 mg total) by mouth at bedtime.    Dispense:  90 tablet    Refill:  3    Order Specific Question:   Supervising Provider    Answer:   Janora Norlander [[id number]]   simvastatin (ZOCOR) 20 MG tablet    Sig: Take 1 tablet (20 mg total) by mouth daily at 6 PM.    Dispense:  90 tablet    Refill:  3    Order Specific Question:   Supervising Provider    Answer:   Janora Norlander [[id number]]   primidone (MYSOLINE) 50 MG tablet    Sig: Take 1 tablet (50 mg total) by mouth 2 (two) times daily. (Needs to be seen before next refill)    Dispense:  180 tablet    Refill:  3    Please consider 90 day supplies to promote better adherence    Order Specific Question:   Supervising Provider    Answer:   Janora Norlander [[id number]]   predniSONE (STERAPRED UNI-PAK 48 TAB) 10 MG (48) TBPK tablet    Sig: Take as directed for 12 days    Dispense:  48 tablet    Refill:  0    Order Specific Question:   Supervising Provider    Answer:   Janora Norlander [[id number]]    Kaitlin Nearing PA-C [place] [phone]

## 2019-03-19 ENCOUNTER — Other Ambulatory Visit: Payer: Self-pay

## 2019-03-19 ENCOUNTER — Other Ambulatory Visit

## 2019-03-19 DIAGNOSIS — Z0000 Encounter for general adult medical examination without abnormal findings: Secondary | ICD-10-CM

## 2019-03-20 LAB — CBC WITH DIFFERENTIAL/PLATELET
Basophils Absolute: 0.1 10*3/uL (ref 0.0–0.2)
Basos: 1 %
EOS (ABSOLUTE): 0.1 10*3/uL (ref 0.0–0.4)
Eos: 2 %
Hematocrit: 40 % (ref 34.0–46.6)
Hemoglobin: 13.3 g/dL (ref 11.1–15.9)
Immature Grans (Abs): 0 10*3/uL (ref 0.0–0.1)
Immature Granulocytes: 0 %
Lymphocytes Absolute: 1.1 10*3/uL (ref 0.7–3.1)
Lymphs: 20 %
MCH: 31.4 pg (ref 26.6–33.0)
MCHC: 33.3 g/dL (ref 31.5–35.7)
MCV: 95 fL (ref 79–97)
Monocytes Absolute: 0.4 10*3/uL (ref 0.1–0.9)
Monocytes: 7 %
Neutrophils Absolute: 3.7 10*3/uL (ref 1.4–7.0)
Neutrophils: 70 %
Platelets: 219 10*3/uL (ref 150–450)
RBC: 4.23 x10E6/uL (ref 3.77–5.28)
RDW: 13.3 % (ref 11.7–15.4)
WBC: 5.3 10*3/uL (ref 3.4–10.8)

## 2019-03-20 LAB — LIPID PANEL
Chol/HDL Ratio: 3 ratio (ref 0.0–4.4)
Cholesterol, Total: 168 mg/dL (ref 100–199)
HDL: 56 mg/dL (ref >39)
LDL Chol Calc (NIH): 94 mg/dL (ref 0–99)
Triglycerides: 100 mg/dL (ref 0–149)
VLDL Cholesterol Cal: 18 mg/dL (ref 5–40)

## 2019-03-20 LAB — TSH: TSH: 2.29 u[IU]/mL (ref 0.450–4.500)

## 2019-03-20 LAB — CMP14+EGFR
ALT: 22 IU/L (ref 0–32)
AST: 19 IU/L (ref 0–40)
Albumin/Globulin Ratio: 1.8 (ref 1.2–2.2)
Albumin: 3.8 g/dL (ref 3.8–4.8)
Alkaline Phosphatase: 99 IU/L (ref 39–117)
BUN/Creatinine Ratio: 11 — ABNORMAL LOW (ref 12–28)
BUN: 13 mg/dL (ref 8–27)
Bilirubin Total: <0.2 mg/dL (ref 0.0–1.2)
CO2: 21 mmol/L (ref 20–29)
Calcium: 9.3 mg/dL (ref 8.7–10.3)
Chloride: 106 mmol/L (ref 96–106)
Creatinine, Ser: 1.16 mg/dL — ABNORMAL HIGH (ref 0.57–1.00)
GFR calc Af Amer: 58 mL/min/{1.73_m2} — ABNORMAL LOW (ref >59)
GFR calc non Af Amer: 51 mL/min/{1.73_m2} — ABNORMAL LOW (ref >59)
Globulin, Total: 2.1 g/dL (ref 1.5–4.5)
Glucose: 112 mg/dL — ABNORMAL HIGH (ref 65–99)
Potassium: 4.4 mmol/L (ref 3.5–5.2)
Sodium: 141 mmol/L (ref 134–144)
Total Protein: 5.9 g/dL — ABNORMAL LOW (ref 6.0–8.5)

## 2019-03-21 ENCOUNTER — Other Ambulatory Visit: Payer: Self-pay | Admitting: Physician Assistant

## 2019-03-21 DIAGNOSIS — R899 Unspecified abnormal finding in specimens from other organs, systems and tissues: Secondary | ICD-10-CM

## 2019-05-07 ENCOUNTER — Ambulatory Visit (INDEPENDENT_AMBULATORY_CARE_PROVIDER_SITE_OTHER): Admitting: Family Medicine

## 2019-05-07 ENCOUNTER — Encounter: Payer: Self-pay | Admitting: Family Medicine

## 2019-05-07 ENCOUNTER — Other Ambulatory Visit: Payer: Self-pay

## 2019-05-07 DIAGNOSIS — R21 Rash and other nonspecific skin eruption: Secondary | ICD-10-CM

## 2019-05-07 DIAGNOSIS — L282 Other prurigo: Secondary | ICD-10-CM | POA: Diagnosis not present

## 2019-05-07 MED ORDER — FAMOTIDINE 20 MG PO TABS: 20.0000 mg | ORAL_TABLET | Freq: Two times a day (BID) | ORAL | 0 refills | Status: DC

## 2019-05-07 MED ORDER — HYDROXYZINE PAMOATE 25 MG PO CAPS: 25.0000 mg | ORAL_CAPSULE | Freq: Three times a day (TID) | ORAL | 0 refills | Status: DC | PRN

## 2019-05-07 MED ORDER — PREDNISONE 10 MG (21) PO TBPK: ORAL_TABLET | ORAL | 0 refills | Status: DC

## 2019-05-07 NOTE — Progress Notes (Signed)
Virtual Visit via telephone Note Due to COVID-19 pandemic this visit was conducted virtually. This visit type was conducted due to national recommendations for restrictions regarding the COVID-19 Pandemic (e.g. social distancing, sheltering in place) in an effort to limit this patient's exposure and mitigate transmission in our community. All issues noted in this document were discussed and addressed.  A physical exam was not performed with this format.   I connected with [name] on 05/07/2019 at [place] by telephone and verified that I am speaking with the correct person using two identifiers. [name] is currently located at home and family is currently with them during visit. The provider, Monia Pouch, FNP is located in their office at time of visit.  I discussed the limitations, risks, security and privacy concerns of performing an evaluation and management service by telephone and the availability of in person appointments. I also discussed with the patient that there may be a patient responsible charge related to this service. The patient expressed understanding and agreed to proceed.  Subjective:  Patient ID: [name], female    DOB: 04/02/1956, 63 y.o.   MRN: [id number]  Chief Complaint:  Rash   HPI: [name] is a 63 y.o. female presenting on 05/07/2019 for Rash   Pt reports recurrent pruritic rash. States this started after surgery several months ago and has recurred at least 3 times since then. States the rash appeared after 2 barium swallow studies and then again over the last few days. States she has tried to reach out to her Psychologist, sport and exercise but did not get a response. States she last had a sigmoidostomy on 04/13/2019. States the rash returned several days after this procedure and has become worse. She has been using topical steroid creams without relief. No signs of anaphlyaxis.   Rash This is a recurrent problem. The current episode started more than 1 month ago. The  problem has been waxing and waning since onset. The affected locations include the abdomen, torso, back, left arm and right arm. The rash is characterized by redness and itchiness (small, raised papules). It is unknown if there was an exposure to a precipitant. Pertinent negatives include no anorexia, congestion, cough, diarrhea, eye pain, facial edema, fatigue, fever, joint pain, nail changes, rhinorrhea, shortness of breath, sore throat or vomiting. Past treatments include antihistamine, anti-itch cream and topical steroids. The treatment provided mild relief.     Relevant past medical, surgical, family, and social history reviewed and updated as indicated.  Allergies and medications reviewed and updated.   Past Medical History:  Diagnosis Date  . Cancer ([place])    cervical 2003  . Vertigo     Past Surgical History:  Procedure Laterality Date  . ABDOMINAL HYSTERECTOMY    . CHOLECYSTECTOMY      Social History   Socioeconomic History  . Marital status: Married    Spouse name: Not on file  . Number of children: Not on file  . Years of education: Not on file  . Highest education level: Not on file  Occupational History  . Not on file  Social Needs  . Financial resource strain: Not on file  . Food insecurity    Worry: Not on file    Inability: Not on file  . Transportation needs    Medical: Not on file    Non-medical: Not on file  Tobacco Use  . Smoking status: Current Some Day Smoker  . Smokeless tobacco: Never Used  Substance and  Sexual Activity  . Alcohol use: Yes  . Drug use: No  . Sexual activity: Not on file  Lifestyle  . Physical activity    Days per week: Not on file    Minutes per session: Not on file  . Stress: Not on file  Relationships  . Social Herbalist on phone: Not on file    Gets together: Not on file    Attends religious service: Not on file    Active member of club or organization: Not on file    Attends meetings of clubs or  organizations: Not on file    Relationship status: Not on file  . Intimate partner violence    Fear of current or ex partner: Not on file    Emotionally abused: Not on file    Physically abused: Not on file    Forced sexual activity: Not on file  Other Topics Concern  . Not on file  Social History Narrative  . Not on file    Outpatient Encounter Medications as of 05/07/2019  Medication Sig  . acetaminophen (TYLENOL) 500 MG tablet Take 500 mg by mouth daily as needed for moderate pain.  Marland Kitchen augmented betamethasone dipropionate (DIPROLENE-AF) 0.05 % cream Apply topically 2 (two) times daily.  . citalopram (CELEXA) 20 MG tablet Take 1 tablet (20 mg total) by mouth daily. (Needs to be seen before next refill)  . famotidine (PEPCID) 20 MG tablet Take 1 tablet (20 mg total) by mouth 2 (two) times daily for 14 days.  . fluticasone (FLONASE) 50 MCG/ACT nasal spray Place 1 spray into both nostrils 2 (two) times daily.  . hydrOXYzine (VISTARIL) 25 MG capsule Take 1 capsule (25 mg total) by mouth 3 (three) times daily as needed.  Marland Kitchen levothyroxine (SYNTHROID) 25 MCG tablet Take 1 tablet (25 mcg total) by mouth daily.  Marland Kitchen lidocaine (XYLOCAINE) 2 % jelly Apply 1 application topically daily as needed for pain.  Marland Kitchen loratadine (CLARITIN) 10 MG tablet Take 1 tablet (10 mg total) by mouth daily.  . montelukast (SINGULAIR) 10 MG tablet Take 1 tablet (10 mg total) by mouth at bedtime.  . predniSONE (STERAPRED UNI-PAK 21 TAB) 10 MG (21) TBPK tablet As directed x 6 days  . primidone (MYSOLINE) 50 MG tablet Take 1 tablet (50 mg total) by mouth 2 (two) times daily. (Needs to be seen before next refill)  . simvastatin (ZOCOR) 20 MG tablet Take 1 tablet (20 mg total) by mouth daily at 6 PM.  . [DISCONTINUED] predniSONE (STERAPRED UNI-PAK 48 TAB) 10 MG (48) TBPK tablet Take as directed for 12 days   No facility-administered encounter medications on file as of 05/07/2019.     Allergies  Allergen Reactions  .  Barium-Containing Compounds     hives  . Percocet [Oxycodone-Acetaminophen] Other (See Comments)    migraine     Review of Systems  Constitutional: Negative for activity change, appetite change, chills, diaphoresis, fatigue, fever and unexpected weight change.  HENT: Negative.  Negative for congestion, facial swelling, rhinorrhea, sore throat, trouble swallowing and voice change.   Eyes: Negative.  Negative for pain.  Respiratory: Negative for cough, choking, chest tightness, shortness of breath, wheezing and stridor.   Cardiovascular: Negative for chest pain, palpitations and leg swelling.  Gastrointestinal: Negative for abdominal pain, anorexia, blood in stool, constipation, diarrhea, nausea and vomiting.  Endocrine: Negative.   Genitourinary: Negative for dysuria, frequency and urgency.  Musculoskeletal: Negative for arthralgias, joint pain and myalgias.  Skin:  Positive for color change and rash. Negative for nail changes, pallor and wound.  Allergic/Immunologic: Negative.   Neurological: Negative for dizziness, syncope, weakness, light-headedness and headaches.  Hematological: Negative.   Psychiatric/Behavioral: Negative for confusion, hallucinations, sleep disturbance and suicidal ideas.  All other systems reviewed and are negative.        Observations/Objective: No vital signs or physical exam, this was a telephone or virtual health encounter.  Pt alert and oriented, answers all questions appropriately, and able to speak in full sentences.    Assessment and Plan: [name] was seen today for rash.  Diagnoses and all orders for this visit:  Rash of unknown etiology Pruritic rash Recurrent pruritic rash of unknown etiology. Pt declined referral to allergist. Will treat with below. No red flags concerning for anaphylaxis. Pt aware of symptoms that require emergent evaluation. Continue claritin along with below.  -     predniSONE (STERAPRED UNI-PAK 21 TAB) 10 MG (21) TBPK tablet;  As directed x 6 days -     hydrOXYzine (VISTARIL) 25 MG capsule; Take 1 capsule (25 mg total) by mouth 3 (three) times daily as needed. -     famotidine (PEPCID) 20 MG tablet; Take 1 tablet (20 mg total) by mouth 2 (two) times daily for 14 days.     Follow Up Instructions: Return if symptoms worsen or fail to improve.    I discussed the assessment and treatment plan with the patient. The patient was provided an opportunity to ask questions and all were answered. The patient agreed with the plan and demonstrated an understanding of the instructions.   The patient was advised to call back or seek an in-person evaluation if the symptoms worsen or if the condition fails to improve as anticipated.  The above assessment and management plan was discussed with the patient. The patient verbalized understanding of and has agreed to the management plan. Patient is aware to call the clinic if they develop any new symptoms or if symptoms persist or worsen. Patient is aware when to return to the clinic for a follow-up visit. Patient educated on when it is appropriate to go to the emergency department.    I provided 15 minutes of non-face-to-face time during this encounter. The call started at [place]. The call ended at [place]. The other time was used for coordination of care.    Monia Pouch, FNP-C [facility] [address] [place], [place] [address] [phone] 05/07/2019

## 2019-06-18 HISTORY — PX: CYSTOSCOPY: SUR368

## 2019-07-14 ENCOUNTER — Encounter: Payer: Self-pay | Admitting: Physician Assistant

## 2019-07-14 ENCOUNTER — Ambulatory Visit (INDEPENDENT_AMBULATORY_CARE_PROVIDER_SITE_OTHER): Admitting: Physician Assistant

## 2019-07-14 DIAGNOSIS — F419 Anxiety disorder, unspecified: Secondary | ICD-10-CM

## 2019-07-14 DIAGNOSIS — E039 Hypothyroidism, unspecified: Secondary | ICD-10-CM | POA: Diagnosis not present

## 2019-07-14 DIAGNOSIS — R21 Rash and other nonspecific skin eruption: Secondary | ICD-10-CM | POA: Diagnosis not present

## 2019-07-14 DIAGNOSIS — L282 Other prurigo: Secondary | ICD-10-CM

## 2019-07-14 DIAGNOSIS — G250 Essential tremor: Secondary | ICD-10-CM

## 2019-07-14 MED ORDER — PRIMIDONE 50 MG PO TABS: 50.0000 mg | ORAL_TABLET | Freq: Two times a day (BID) | ORAL | 3 refills | Status: DC

## 2019-07-14 MED ORDER — CITALOPRAM HYDROBROMIDE 20 MG PO TABS: 20.0000 mg | ORAL_TABLET | Freq: Every day | ORAL | 3 refills | Status: DC

## 2019-07-14 MED ORDER — FAMOTIDINE 20 MG PO TABS: 20.0000 mg | ORAL_TABLET | Freq: Two times a day (BID) | ORAL | 0 refills | Status: DC

## 2019-07-14 MED ORDER — SIMVASTATIN 20 MG PO TABS: 20.0000 mg | ORAL_TABLET | Freq: Every day | ORAL | 3 refills | Status: DC

## 2019-07-14 MED ORDER — LEVOTHYROXINE SODIUM 25 MCG PO TABS: 25.0000 ug | ORAL_TABLET | Freq: Every day | ORAL | 3 refills | Status: DC

## 2019-07-14 NOTE — Progress Notes (Signed)
Telephone visit  Subjective: CC: recheck chronic conditions PCP: Terald Sleeper, PA-C [id number] [name] is a 64 y.o. female calls for telephone consult today. Patient provides verbal consent for consult held via phone.  Patient is identified with 2 separate identifiers.  At this time the entire area is on COVID-19 social distancing and stay home orders are in place.  Patient is of higher risk and therefore we are performing this by a virtual method.  Location of patient: home Location of provider: HOME Others present for call: no  She has been doing well overall. 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 She had the first part is not having infection anymore. However they were supposed to completely close up the area. But because of Covid it is not considered an emergent surgery. So she states for now she is doing okay but is just anxious to get that completed. It has been well over a year since she has been dealing with it.  Patient does have hypothyroidism, and essential tremor, and anxiety. All of her medications are reviewed and she will have refills. Her last labs were performed in the fall and they all looked good except for comprehensive metabolic which showed a little bit of kidney abnormality. She is going to come back in soon and have another lab performed.   ROS: Per HPI  Allergies  Allergen Reactions  . Barium-Containing Compounds     hives  . Percocet [Oxycodone-Acetaminophen] Other (See Comments)    migraine    Past Medical History:  Diagnosis Date  . Cancer ([place])    cervical 2003  . Vertigo     Current Outpatient Medications:  .  acetaminophen (TYLENOL) 500 MG tablet, Take 500 mg by mouth daily as needed for moderate pain., Disp: , Rfl:  .  augmented betamethasone dipropionate (DIPROLENE-AF) 0.05 % cream, Apply topically 2 (two) times daily., Disp: 50 g, Rfl: 5 .  citalopram (CELEXA) 20 MG tablet, Take 1 tablet (20 mg total) by mouth  daily. (Needs to be seen before next refill), Disp: 90 tablet, Rfl: 3 .  famotidine (PEPCID) 20 MG tablet, Take 1 tablet (20 mg total) by mouth 2 (two) times daily for 14 days., Disp: 28 tablet, Rfl: 0 .  fluticasone (FLONASE) 50 MCG/ACT nasal spray, Place 1 spray into both nostrils 2 (two) times daily., Disp: 48 g, Rfl: 11 .  hydrOXYzine (VISTARIL) 25 MG capsule, Take 1 capsule (25 mg total) by mouth 3 (three) times daily as needed., Disp: 30 capsule, Rfl: 0 .  levothyroxine (SYNTHROID) 25 MCG tablet, Take 1 tablet (25 mcg total) by mouth daily., Disp: 90 tablet, Rfl: 3 .  lidocaine (XYLOCAINE) 2 % jelly, Apply 1 application topically daily as needed for pain., Disp: , Rfl:  .  loratadine (CLARITIN) 10 MG tablet, Take 1 tablet (10 mg total) by mouth daily., Disp: 90 tablet, Rfl: 3 .  montelukast (SINGULAIR) 10 MG tablet, Take 1 tablet (10 mg total) by mouth at bedtime., Disp: 90 tablet, Rfl: 3 .  predniSONE (STERAPRED UNI-PAK 21 TAB) 10 MG (21) TBPK tablet, As directed x 6 days, Disp: 21 tablet, Rfl: 0 .  primidone (MYSOLINE) 50 MG tablet, Take 1 tablet (50 mg total) by mouth 2 (two) times daily., Disp: 180 tablet, Rfl: 3 .  simvastatin (ZOCOR) 20 MG tablet, Take 1 tablet (20 mg total) by mouth daily at 6 PM., Disp: 90 tablet, Rfl: 3  Assessment/ Plan: 64 y.o. female  1. Essential tremor - primidone (MYSOLINE) 50 MG tablet; Take 1 tablet (50 mg total) by mouth 2 (two) times daily.  Dispense: 180 tablet; Refill: 3  2. Rash of unknown etiology - famotidine (PEPCID) 20 MG tablet; Take 1 tablet (20 mg total) by mouth 2 (two) times daily for 14 days.  Dispense: 28 tablet; Refill: 0  3. Pruritic rash - famotidine (PEPCID) 20 MG tablet; Take 1 tablet (20 mg total) by mouth 2 (two) times daily for 14 days.  Dispense: 28 tablet; Refill: 0  4. Anxiety - citalopram (CELEXA) 20 MG tablet; Take 1 tablet (20 mg total) by mouth daily. (Needs to be seen before next refill)  Dispense: 90 tablet; Refill:  3  5. Acquired hypothyroidism   No follow-ups on file.  Continue all other maintenance medications as listed above.  Start time: 8:25 AM End time: 8:39 AM  Meds ordered this encounter  Medications  . primidone (MYSOLINE) 50 MG tablet    Sig: Take 1 tablet (50 mg total) by mouth 2 (two) times daily.    Dispense:  180 tablet    Refill:  3    Order Specific Question:   Supervising Provider    Answer:   Janora Norlander [id number]  . simvastatin (ZOCOR) 20 MG tablet    Sig: Take 1 tablet (20 mg total) by mouth daily at 6 PM.    Dispense:  90 tablet    Refill:  3    Order Specific Question:   Supervising Provider    Answer:   Janora Norlander [id number]  . famotidine (PEPCID) 20 MG tablet    Sig: Take 1 tablet (20 mg total) by mouth 2 (two) times daily for 14 days.    Dispense:  28 tablet    Refill:  0    Order Specific Question:   Supervising Provider    Answer:   Janora Norlander [id number]  . citalopram (CELEXA) 20 MG tablet    Sig: Take 1 tablet (20 mg total) by mouth daily. (Needs to be seen before next refill)    Dispense:  90 tablet    Refill:  3    Order Specific Question:   Supervising Provider    Answer:   Janora Norlander [id number]  . levothyroxine (SYNTHROID) 25 MCG tablet    Sig: Take 1 tablet (25 mcg total) by mouth daily.    Dispense:  90 tablet    Refill:  3    Order Specific Question:   Supervising Provider    Answer:   Janora Norlander [id number]    Particia Nearing PA-C [place] [phone]

## 2019-09-03 ENCOUNTER — Other Ambulatory Visit

## 2019-09-03 ENCOUNTER — Other Ambulatory Visit: Payer: Self-pay

## 2019-09-04 LAB — CMP14+EGFR
ALT: 12 IU/L (ref 0–32)
AST: 18 IU/L (ref 0–40)
Albumin/Globulin Ratio: 2.3 — ABNORMAL HIGH (ref 1.2–2.2)
Albumin: 4.2 g/dL (ref 3.8–4.8)
Alkaline Phosphatase: 99 IU/L (ref 39–117)
BUN/Creatinine Ratio: 12 (ref 12–28)
BUN: 13 mg/dL (ref 8–27)
Bilirubin Total: <0.2 mg/dL (ref 0.0–1.2)
CO2: 22 mmol/L (ref 20–29)
Calcium: 9.2 mg/dL (ref 8.7–10.3)
Chloride: 109 mmol/L — ABNORMAL HIGH (ref 96–106)
Creatinine, Ser: 1.06 mg/dL — ABNORMAL HIGH (ref 0.57–1.00)
GFR calc Af Amer: 65 mL/min/{1.73_m2} (ref >59)
GFR calc non Af Amer: 56 mL/min/{1.73_m2} — ABNORMAL LOW (ref >59)
Globulin, Total: 1.8 g/dL (ref 1.5–4.5)
Glucose: 89 mg/dL (ref 65–99)
Potassium: 4.3 mmol/L (ref 3.5–5.2)
Sodium: 142 mmol/L (ref 134–144)
Total Protein: 6 g/dL (ref 6.0–8.5)

## 2019-12-27 ENCOUNTER — Ambulatory Visit (INDEPENDENT_AMBULATORY_CARE_PROVIDER_SITE_OTHER)

## 2019-12-27 ENCOUNTER — Other Ambulatory Visit: Payer: Self-pay

## 2019-12-27 DIAGNOSIS — Z23 Encounter for immunization: Secondary | ICD-10-CM

## 2020-02-28 ENCOUNTER — Other Ambulatory Visit: Payer: Self-pay

## 2020-02-28 ENCOUNTER — Ambulatory Visit (INDEPENDENT_AMBULATORY_CARE_PROVIDER_SITE_OTHER)

## 2020-02-28 DIAGNOSIS — Z23 Encounter for immunization: Secondary | ICD-10-CM

## 2020-03-02 ENCOUNTER — Ambulatory Visit (INDEPENDENT_AMBULATORY_CARE_PROVIDER_SITE_OTHER): Admitting: Nurse Practitioner

## 2020-03-02 ENCOUNTER — Encounter: Payer: Self-pay | Admitting: Nurse Practitioner

## 2020-03-02 ENCOUNTER — Other Ambulatory Visit: Payer: Self-pay

## 2020-03-02 VITALS — BP 97/65 | HR 67 | Temp 97.6°F | Ht 62.0 in | Wt 118.8 lb

## 2020-03-02 DIAGNOSIS — Z0000 Encounter for general adult medical examination without abnormal findings: Secondary | ICD-10-CM

## 2020-03-02 DIAGNOSIS — E039 Hypothyroidism, unspecified: Secondary | ICD-10-CM | POA: Diagnosis not present

## 2020-03-02 DIAGNOSIS — E782 Mixed hyperlipidemia: Secondary | ICD-10-CM | POA: Diagnosis not present

## 2020-03-02 DIAGNOSIS — Z0001 Encounter for general adult medical examination with abnormal findings: Secondary | ICD-10-CM

## 2020-03-02 DIAGNOSIS — Z23 Encounter for immunization: Secondary | ICD-10-CM | POA: Diagnosis not present

## 2020-03-02 DIAGNOSIS — L282 Other prurigo: Secondary | ICD-10-CM

## 2020-03-02 DIAGNOSIS — R21 Rash and other nonspecific skin eruption: Secondary | ICD-10-CM | POA: Diagnosis not present

## 2020-03-02 DIAGNOSIS — L299 Pruritus, unspecified: Secondary | ICD-10-CM

## 2020-03-02 DIAGNOSIS — L309 Dermatitis, unspecified: Secondary | ICD-10-CM

## 2020-03-02 DIAGNOSIS — J309 Allergic rhinitis, unspecified: Secondary | ICD-10-CM

## 2020-03-02 DIAGNOSIS — G250 Essential tremor: Secondary | ICD-10-CM

## 2020-03-02 LAB — COMPREHENSIVE METABOLIC PANEL
ALT: 40 IU/L — ABNORMAL HIGH (ref 0–32)
AST: 27 IU/L (ref 0–40)
Albumin/Globulin Ratio: 2 (ref 1.2–2.2)
Albumin: 4.2 g/dL (ref 3.8–4.8)
Alkaline Phosphatase: 109 IU/L (ref 44–121)
BUN/Creatinine Ratio: 13 (ref 12–28)
BUN: 17 mg/dL (ref 8–27)
Bilirubin Total: <0.2 mg/dL (ref 0.0–1.2)
CO2: 21 mmol/L (ref 20–29)
Calcium: 9.2 mg/dL (ref 8.7–10.3)
Chloride: 106 mmol/L (ref 96–106)
Creatinine, Ser: 1.26 mg/dL — ABNORMAL HIGH (ref 0.57–1.00)
GFR calc Af Amer: 52 mL/min/{1.73_m2} — ABNORMAL LOW (ref >59)
GFR calc non Af Amer: 45 mL/min/{1.73_m2} — ABNORMAL LOW (ref >59)
Globulin, Total: 2.1 g/dL (ref 1.5–4.5)
Glucose: 91 mg/dL (ref 65–99)
Potassium: 5.1 mmol/L (ref 3.5–5.2)
Sodium: 138 mmol/L (ref 134–144)
Total Protein: 6.3 g/dL (ref 6.0–8.5)

## 2020-03-02 MED ORDER — PRIMIDONE 50 MG PO TABS: 50.0000 mg | ORAL_TABLET | Freq: Two times a day (BID) | ORAL | 3 refills | Status: DC

## 2020-03-02 MED ORDER — FLUTICASONE PROPIONATE 50 MCG/ACT NA SUSP: 1.0000 | Freq: Two times a day (BID) | NASAL | 11 refills | Status: DC

## 2020-03-02 MED ORDER — LORATADINE 10 MG PO TABS: 10.0000 mg | ORAL_TABLET | Freq: Every day | ORAL | 3 refills | Status: DC

## 2020-03-02 MED ORDER — SIMVASTATIN 20 MG PO TABS: 20.0000 mg | ORAL_TABLET | Freq: Every day | ORAL | 3 refills | Status: DC

## 2020-03-02 MED ORDER — LEVOTHYROXINE SODIUM 25 MCG PO TABS: 25.0000 ug | ORAL_TABLET | Freq: Every day | ORAL | 3 refills | Status: DC

## 2020-03-02 MED ORDER — HYDROXYZINE PAMOATE 25 MG PO CAPS: 25.0000 mg | ORAL_CAPSULE | Freq: Three times a day (TID) | ORAL | 0 refills | Status: DC | PRN

## 2020-03-02 MED ORDER — MONTELUKAST SODIUM 10 MG PO TABS: 10.0000 mg | ORAL_TABLET | Freq: Every day | ORAL | 3 refills | Status: DC

## 2020-03-02 MED ORDER — FAMOTIDINE 20 MG PO TABS: 20.0000 mg | ORAL_TABLET | Freq: Two times a day (BID) | ORAL | 0 refills | Status: DC

## 2020-03-02 NOTE — Assessment & Plan Note (Addendum)
Mixed hyperlipidemia well-controlled on current medication no changes necessary.  Patient is compliant with medication, no signs or symptoms of hyperlipidemia.  Patient has no side effect from medication, and follows up as directed.  Current medication simvastatin 20 mg tablet daily.  Completed lipid panel lab with pending results.  Provided education to patient with printed handouts given Continue a low-cholesterol diet and exercise.

## 2020-03-02 NOTE — Assessment & Plan Note (Signed)
Patient is a 64 year old female who presents to clinic today for her annual physical exam.  Patient has not had a physical in the last 2 years.  Patient has a history of cervical cancer.  She refused Pap today, refused breast exam today, refused mammogram scheduling due to upcoming surgery.  Refuses colonoscopy/Cologuard.  Patient will get a flu shot in October. Tdap shot completed today.  Patient has had Covid-19 vaccination in the past and shingles shot. Completed head to toe assessment with education provided for health maintenance and preventative care.  Labs completed today-CBC, CMP, lipid, TSH, hepatitis panel and HIV.  Labs pending.  Rx refill sent to pharmacy.

## 2020-03-02 NOTE — Progress Notes (Signed)
Established Patient Office Visit  Subjective:  Patient ID: [name], female    DOB: 1956/05/14  Age: 64 y.o. MRN: [id number]  CC:  Chief Complaint  Patient presents with   Medication Refill    HPI [name] presents for .   Encounter for general adult medical examination without abnormal findings  Physical: Patient's last physical exam was 2 year ago .  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 Dental Care: biannual cleanings, brushes and flosses daily. Ophthalmology/Optometry: Annual visit.  Hearing loss: none Vision impairments: none  Past Medical History:  Diagnosis Date   Cancer ([place])    cervical 2003   Vertigo     Past Surgical History:  Procedure Laterality Date   ABDOMINAL HYSTERECTOMY     CHOLECYSTECTOMY        Social History   Socioeconomic History   Marital status: Married    Spouse name: Not on file   Number of children: Not on file   Years of education: Not on file   Highest education level: Not on file  Occupational History   Not on file  Tobacco Use   Smoking status: Former Smoker    Quit date: 02/10/2020    Years since quitting: 0.0   Smokeless tobacco: Never Used  Vaping Use   Vaping Use: Never used  Substance and Sexual  Activity   Alcohol use: Yes   Drug use: No   Sexual activity: Not on file  Other Topics Concern   Not on file  Social History Narrative   Not on file   Social Determinants of Health   Financial Resource Strain:    Difficulty of Paying Living Expenses: Not on file  Food Insecurity:    Worried About Charity fundraiser in the Last Year: Not on file   [place] in the Last Year: Not on file  Transportation Needs:    Lack of Transportation (Medical): Not on file   Lack of Transportation (Non-Medical): Not on file  Physical Activity:    Days of Exercise per Week: Not on file   Minutes of Exercise per Session: Not on file  Stress:    Feeling of Stress : Not on file  Social Connections:    Frequency of Communication with Friends and Family: Not on file   Frequency of Social Gatherings with Friends and Family: Not on file   Attends Religious Services: Not on file   Active Member of Clubs or Organizations: Not on file   Attends Archivist Meetings: Not on file   Marital Status: Not on file  Intimate Partner Violence:    Fear of Current or Ex-Partner: Not on file   Emotionally Abused: Not on file   Physically Abused: Not on file   Sexually Abused: Not on  file    Outpatient Medications Prior to Visit  Medication Sig Dispense Refill   acetaminophen (TYLENOL) 500 MG tablet Take 500 mg by mouth daily as needed for moderate pain.     fluticasone (FLONASE) 50 MCG/ACT nasal spray Place 1 spray into both nostrils 2 (two) times daily. 48 g 11   hydrOXYzine (VISTARIL) 25 MG capsule Take 1 capsule (25 mg total) by mouth 3 (three) times daily as needed. 30 capsule 0   levothyroxine (SYNTHROID) 25 MCG tablet Take 1 tablet (25 mcg total) by mouth daily. 90 tablet 3   loratadine (CLARITIN) 10 MG tablet Take 1 tablet (10 mg total) by mouth daily. 90 tablet 3   montelukast (SINGULAIR) 10 MG tablet Take 1 tablet (10 mg total) by mouth at bedtime. 90  tablet 3   primidone (MYSOLINE) 50 MG tablet Take 1 tablet (50 mg total) by mouth 2 (two) times daily. 180 tablet 3   simvastatin (ZOCOR) 20 MG tablet Take 1 tablet (20 mg total) by mouth daily at 6 PM. 90 tablet 3   famotidine (PEPCID) 20 MG tablet Take 1 tablet (20 mg total) by mouth 2 (two) times daily for 14 days. 28 tablet 0   augmented betamethasone dipropionate (DIPROLENE-AF) 0.05 % cream Apply topically 2 (two) times daily. (Patient not taking: Reported on 03/02/2020) 50 g 5   citalopram (CELEXA) 20 MG tablet Take 1 tablet (20 mg total) by mouth daily. (Needs to be seen before next refill) (Patient not taking: Reported on 03/02/2020) 90 tablet 3   lidocaine (XYLOCAINE) 2 % jelly Apply 1 application topically daily as needed for pain. (Patient not taking: Reported on 03/02/2020)     predniSONE (STERAPRED UNI-PAK 21 TAB) 10 MG (21) TBPK tablet As directed x 6 days (Patient not taking: Reported on 03/02/2020) 21 tablet 0   No facility-administered medications prior to visit.    Allergies  Allergen Reactions   Barium-Containing Compounds     hives   Percocet [Oxycodone-Acetaminophen] Other (See Comments)    migraine     ROS Review of Systems  Gastrointestinal: Negative for abdominal pain.       Colostomy bag  Skin: Negative for color change.  All other systems reviewed and are negative.     Objective:    Physical Exam Vitals reviewed.  Constitutional:      Appearance: Normal appearance.  HENT:     Head: Normocephalic.     Mouth/Throat:     Mouth: Mucous membranes are moist.     Pharynx: Oropharynx is clear.  Eyes:     Conjunctiva/sclera: Conjunctivae normal.  Cardiovascular:     Rate and Rhythm: Normal rate and regular rhythm.     Pulses: Normal pulses.     Heart sounds: Normal heart sounds.  Pulmonary:     Effort: Pulmonary effort is normal.     Breath sounds: Normal breath sounds.  Abdominal:     General: Bowel sounds are normal.     Comments: Colostomy  bag  Musculoskeletal:        General: Normal range of motion.     Cervical back: Normal range of motion.  Skin:    General: Skin is warm.  Neurological:     Mental Status: She is alert and oriented to person, place, and time.  Psychiatric:        Mood and Affect: Mood normal.        Behavior: Behavior normal.     BP 97/65    Pulse 67  Temp 97.6 F (36.4 C)    Ht 5\' 2"  (1.575 m)    Wt 118 lb 12.8 oz (53.9 kg)    SpO2 95%    BMI 21.73 kg/m  Wt Readings from Last 3 Encounters:  03/02/20 118 lb 12.8 oz (53.9 kg)  07/21/18 133 lb (60.3 kg)  05/25/18 139 lb (63 kg)     Health Maintenance Due  Topic Date Due   Hepatitis C Screening  Never done   HIV Screening  Never done   TETANUS/TDAP  Never done   MAMMOGRAM  Never done   COLONOSCOPY  Never done   INFLUENZA VACCINE  01/16/2020      Lab Results  Component Value Date   TSH 2.290 03/19/2019   Lab Results  Component Value Date   WBC 5.3 03/19/2019   HGB 13.3 03/19/2019   HCT 40.0 03/19/2019   MCV 95 03/19/2019   PLT 219 03/19/2019   Lab Results  Component Value Date   NA 142 09/03/2019   K 4.3 09/03/2019   CO2 22 09/03/2019   GLUCOSE 89 09/03/2019   BUN 13 09/03/2019   CREATININE 1.06 (H) 09/03/2019   BILITOT <0.2 09/03/2019   ALKPHOS 99 09/03/2019   AST 18 09/03/2019   ALT 12 09/03/2019   PROT 6.0 09/03/2019   ALBUMIN 4.2 09/03/2019   CALCIUM 9.2 09/03/2019   ANIONGAP 7 07/21/2018   Lab Results  Component Value Date   CHOL 168 03/19/2019   Lab Results  Component Value Date   HDL 56 03/19/2019   Lab Results  Component Value Date   LDLCALC 94 03/19/2019   Lab Results  Component Value Date   TRIG 100 03/19/2019   Lab Results  Component Value Date   CHOLHDL 3.0 03/19/2019   No results found for: HGBA1C    Assessment & Plan:   Problem List Items Addressed This Visit      Respiratory   Allergic rhinitis   Relevant Medications   montelukast (SINGULAIR) 10 MG tablet     Endocrine    Hypothyroidism - Primary    Patient is not reporting any new signs and symptoms of hypothyroidism current medication Synthroid 25 mcg tablet daily.  Labs completed and pending results today TSH.  Follow-up as needed with worsening or unresolved symptoms.      Relevant Medications   levothyroxine (SYNTHROID) 25 MCG tablet   Other Relevant Orders   TSH     Nervous and Auditory   Essential tremor   Relevant Medications   primidone (MYSOLINE) 50 MG tablet     Other   Annual physical exam    Patient is a 64 year old female who presents to clinic today for her annual physical exam.  Patient has not had a physical in the last 2 years.  Patient has a history of cervical cancer.  She refused Pap today, refused breast exam today, refused mammogram scheduling due to upcoming surgery.  Refuses colonoscopy/Cologuard.  Patient will get a flu shot in October. Tdap shot completed today.  Patient has had Covid-19 vaccination in the past and shingles shot. Completed head to toe assessment with education provided for health maintenance and preventative care.  Labs completed today-CBC, CMP, lipid, TSH, hepatitis panel and HIV.  Labs pending.  Rx refill sent to pharmacy.      Relevant Orders   CBC with Differential   Comprehensive metabolic panel   Hepatitis C antibody   HIV antibody (with reflex)   Mixed hyperlipidemia  Mixed hyperlipidemia well-controlled on current medication no changes necessary.  Patient is compliant with medication, no signs or symptoms of hyperlipidemia.  Patient has no side effect from medication, and follows up as directed.  Current medication simvastatin 20 mg tablet daily.  Completed lipid panel lab with pending results.  Provided education to patient with printed handouts given Continue a low-cholesterol diet and exercise.      Relevant Medications   simvastatin (ZOCOR) 20 MG tablet   Other Relevant Orders   Lipid Panel    Other Visit Diagnoses    Rash of  unknown etiology       Pruritic rash       Dermatitis       Relevant Medications   loratadine (CLARITIN) 10 MG tablet   Pruritus       Relevant Medications   loratadine (CLARITIN) 10 MG tablet   Need for Tdap vaccination       Relevant Orders   Tdap vaccine greater than or equal to 7yo IM (Completed)      Meds ordered this encounter  Medications   DISCONTD: famotidine (PEPCID) 20 MG tablet    Sig: Take 1 tablet (20 mg total) by mouth 2 (two) times daily for 14 days.    Dispense:  28 tablet    Refill:  0    Order Specific Question:   Supervising Provider    Answer:   Caryl Pina A [[id number]]   fluticasone (FLONASE) 50 MCG/ACT nasal spray    Sig: Place 1 spray into both nostrils 2 (two) times daily.    Dispense:  48 g    Refill:  11    Order Specific Question:   Supervising Provider    Answer:   Caryl Pina A [[id number]]   DISCONTD: hydrOXYzine (VISTARIL) 25 MG capsule    Sig: Take 1 capsule (25 mg total) by mouth 3 (three) times daily as needed.    Dispense:  30 capsule    Refill:  0    Order Specific Question:   Supervising Provider    Answer:   Caryl Pina A [[id number]]   levothyroxine (SYNTHROID) 25 MCG tablet    Sig: Take 1 tablet (25 mcg total) by mouth daily.    Dispense:  90 tablet    Refill:  3    Order Specific Question:   Supervising Provider    Answer:   Caryl Pina A [[id number]]   loratadine (CLARITIN) 10 MG tablet    Sig: Take 1 tablet (10 mg total) by mouth daily.    Dispense:  90 tablet    Refill:  3    Order Specific Question:   Supervising Provider    Answer:   Caryl Pina A [[id number]]   montelukast (SINGULAIR) 10 MG tablet    Sig: Take 1 tablet (10 mg total) by mouth at bedtime.    Dispense:  90 tablet    Refill:  3    Order Specific Question:   Supervising Provider    Answer:   Caryl Pina A [[id number]]   simvastatin (ZOCOR) 20 MG tablet    Sig: Take 1 tablet (20 mg total) by mouth daily at 6 PM.    Dispense:  90  tablet    Refill:  3    Order Specific Question:   Supervising Provider    Answer:   Worthy Rancher [[id number]]   DISCONTD: primidone (MYSOLINE) 50 MG tablet    Sig: Take 1 tablet (50 mg total) by mouth 2 (two)  times daily.    Dispense:  180 tablet    Refill:  3    Order Specific Question:   Supervising Provider    Answer:   Caryl Pina A [[id number]]   primidone (MYSOLINE) 50 MG tablet    Sig: Take 1 tablet (50 mg total) by mouth 2 (two) times daily.    Dispense:  180 tablet    Refill:  3    Order Specific Question:   Supervising Provider    Answer:   Caryl Pina A [[id number]]    Follow-up: Return in about 6 months (around 08/30/2020).    Ivy Lynn, NP

## 2020-03-02 NOTE — Assessment & Plan Note (Signed)
Patient is not reporting any new signs and symptoms of hypothyroidism current medication Synthroid 25 mcg tablet daily.  Labs completed and pending results today TSH.  Follow-up as needed with worsening or unresolved symptoms.

## 2020-03-02 NOTE — Patient Instructions (Addendum)
Hypothyroidism  Hypothyroidism is when the thyroid gland does not make enough of certain hormones (it is underactive). The thyroid gland is a small gland located in the lower front part of the neck, just in front of the windpipe (trachea). This gland makes hormones that help control how the body uses food for energy (metabolism) as well as how the heart and brain function. These hormones also play a role in keeping your bones strong. When the thyroid is underactive, it produces too little of the hormones thyroxine (T4) and triiodothyronine (T3). What are the causes? This condition may be caused by:  Hashimoto's disease. This is a disease in which the body's disease-fighting system (immune system) attacks the thyroid gland. This is the most common cause.  Viral infections.  Pregnancy.  Certain medicines.  Birth defects.  Past radiation treatments to the head or neck for cancer.  Past treatment with radioactive iodine.  Past exposure to radiation in the environment.  Past surgical removal of part or all of the thyroid.  Problems with a gland in the center of the brain (pituitary gland).  Lack of enough iodine in the diet. What increases the risk? You are more likely to develop this condition if:  You are female.  You have a family history of thyroid conditions.  You use a medicine called lithium.  You take medicines that affect the immune system (immunosuppressants). What are the signs or symptoms? Symptoms of this condition include:  Feeling as though you have no energy (lethargy).  Not being able to tolerate cold.  Weight gain that is not explained by a change in diet or exercise habits.  Lack of appetite.  Dry skin.  Coarse hair.  Menstrual irregularity.  Slowing of thought processes.  Constipation.  Sadness or depression. How is this diagnosed? This condition may be diagnosed based on:  Your symptoms, your medical history, and a physical exam.  Blood  tests. You may also have imaging tests, such as an ultrasound or MRI. How is this treated? This condition is treated with medicine that replaces the thyroid hormones that your body does not make. After you begin treatment, it may take several weeks for symptoms to go away. Follow these instructions at home:  Take over-the-counter and prescription medicines only as told by your health care provider.  If you start taking any new medicines, tell your health care provider.  Keep all follow-up visits as told by your health care provider. This is important. ? As your condition improves, your dosage of thyroid hormone medicine may change. ? You will need to have blood tests regularly so that your health care provider can monitor your condition. Contact a health care provider if:  Your symptoms do not get better with treatment.  You are taking thyroid replacement medicine and you: ? Sweat a lot. ? Have tremors. ? Feel anxious. ? Lose weight rapidly. ? Cannot tolerate heat. ? Have emotional swings. ? Have diarrhea. ? Feel weak. Get help right away if you have:  Chest pain.  An irregular heartbeat.  A rapid heartbeat.  Difficulty breathing. Summary  Hypothyroidism is when the thyroid gland does not make enough of certain hormones (it is underactive).  When the thyroid is underactive, it produces too little of the hormones thyroxine (T4) and triiodothyronine (T3).  The most common cause is Hashimoto's disease, a disease in which the body's disease-fighting system (immune system) attacks the thyroid gland. The condition can also be caused by viral infections, medicine, pregnancy, or past   radiation treatment to the head or neck.  Symptoms may include weight gain, dry skin, constipation, feeling as though you do not have energy, and not being able to tolerate cold.  This condition is treated with medicine to replace the thyroid hormones that your body does not make. This information  is not intended to replace advice given to you by your health care provider. Make sure you discuss any questions you have with your health care provider. Document Revised: 05/16/2017 Document Reviewed: 05/14/2017 Elsevier Patient Education  [place]. High Cholesterol  High cholesterol is a condition in which the blood has high levels of a white, waxy, fat-like substance (cholesterol). The human body needs small amounts of cholesterol. The liver makes all the cholesterol that the body needs. Extra (excess) cholesterol comes from the food that we eat. Cholesterol is carried from the liver by the blood through the blood vessels. If you have high cholesterol, deposits (plaques) may build up on the walls of your blood vessels (arteries). Plaques make the arteries narrower and stiffer. Cholesterol plaques increase your risk for heart attack and stroke. Work with your health care provider to keep your cholesterol levels in a healthy range. What increases the risk? This condition is more likely to develop in people who:  Eat foods that are high in animal fat (saturated fat) or cholesterol.  Are overweight.  Are not getting enough exercise.  Have a family history of high cholesterol. What are the signs or symptoms? There are no symptoms of this condition. How is this diagnosed? This condition may be diagnosed from the results of a blood test.  If you are older than age 49, your health care provider may check your cholesterol every 4-6 years.  You may be checked more often if you already have high cholesterol or other risk factors for heart disease. The blood test for cholesterol measures:  "Bad" cholesterol (LDL cholesterol). This is the main type of cholesterol that causes heart disease. The desired level for LDL is less than 100.  "Good" cholesterol (HDL cholesterol). This type helps to protect against heart disease by cleaning the arteries and carrying the LDL away. The desired level  for HDL is 60 or higher.  Triglycerides. These are fats that the body can store or burn for energy. The desired number for triglycerides is lower than 150.  Total cholesterol. This is a measure of the total amount of cholesterol in your blood, including LDL cholesterol, HDL cholesterol, and triglycerides. A healthy number is less than 200. How is this treated? This condition is treated with diet changes, lifestyle changes, and medicines. Diet changes  This may include eating more whole grains, fruits, vegetables, nuts, and fish.  This may also include cutting back on red meat and foods that have a lot of added sugar. Lifestyle changes  Changes may include getting at least 40 minutes of aerobic exercise 3 times a week. Aerobic exercises include walking, biking, and swimming. Aerobic exercise along with a healthy diet can help you maintain a healthy weight.  Changes may also include quitting smoking. Medicines  Medicines are usually given if diet and lifestyle changes have failed to reduce your cholesterol to healthy levels.  Your health care provider may prescribe a statin medicine. Statin medicines have been shown to reduce cholesterol, which can reduce the risk of heart disease. Follow these instructions at home: Eating and drinking If told by your health care provider:  Eat chicken (without skin), fish, veal, shellfish, ground [place] breast,  and round or loin cuts of red meat.  Do not eat fried foods or fatty meats, such as hot dogs and salami.  Eat plenty of fruits, such as apples.  Eat plenty of vegetables, such as broccoli, potatoes, and carrots.  Eat beans, peas, and lentils.  Eat grains such as barley, rice, couscous, and bulgur wheat.  Eat pasta without cream sauces.  Use skim or nonfat milk, and eat low-fat or nonfat yogurt and cheeses.  Do not eat or drink whole milk, cream, ice cream, egg yolks, or hard cheeses.  Do not eat stick margarine or tub margarines that  contain trans fats (also called partially hydrogenated oils).  Do not eat saturated tropical oils, such as coconut oil and palm oil.  Do not eat cakes, cookies, crackers, or other baked goods that contain trans fats.  General instructions  Exercise as directed by your health care provider. Increase your activity level with activities such as gardening, walking, and taking the stairs.  Take over-the-counter and prescription medicines only as told by your health care provider.  Do not use any products that contain nicotine or tobacco, such as cigarettes and e-cigarettes. If you need help quitting, ask your health care provider.  Keep all follow-up visits as told by your health care provider. This is important. Contact a health care provider if:  You are struggling to maintain a healthy diet or weight.  You need help to start on an exercise program.  You need help to stop smoking. Get help right away if:  You have chest pain.  You have trouble breathing. This information is not intended to replace advice given to you by your health care provider. Make sure you discuss any questions you have with your health care provider. Document Revised: 06/06/2017 Document Reviewed: 12/02/2015 Elsevier Patient Education  [place] Maintenance, Female Adopting a healthy lifestyle and getting preventive care are important in promoting health and wellness. Ask your health care provider about:  The right schedule for you to have regular tests and exams.  Things you can do on your own to prevent diseases and keep yourself healthy. What should I know about diet, weight, and exercise? Eat a healthy diet   Eat a diet that includes plenty of vegetables, fruits, low-fat dairy products, and lean protein.  Do not eat a lot of foods that are high in solid fats, added sugars, or sodium. Maintain a healthy weight Body mass index (BMI) is used to identify weight problems. It estimates  body fat based on height and weight. Your health care provider can help determine your BMI and help you achieve or maintain a healthy weight. Get regular exercise Get regular exercise. This is one of the most important things you can do for your health. Most adults should:  Exercise for at least 150 minutes each week. The exercise should increase your heart rate and make you sweat (moderate-intensity exercise).  Do strengthening exercises at least twice a week. This is in addition to the moderate-intensity exercise.  Spend less time sitting. Even light physical activity can be beneficial. Watch cholesterol and blood lipids Have your blood tested for lipids and cholesterol at 64 years of age, then have this test every 5 years. Have your cholesterol levels checked more often if:  Your lipid or cholesterol levels are high.  You are older than 64 years of age.  You are at high risk for heart disease. What should I know about cancer screening? Depending on your health  history and family history, you may need to have cancer screening at various ages. This may include screening for:  Breast cancer.  Cervical cancer.  Colorectal cancer.  Skin cancer.  Lung cancer. What should I know about heart disease, diabetes, and high blood pressure? Blood pressure and heart disease  High blood pressure causes heart disease and increases the risk of stroke. This is more likely to develop in people who have high blood pressure readings, are of African descent, or are overweight.  Have your blood pressure checked: ? Every 3-5 years if you are 56-40 years of age. ? Every year if you are 19 years old or older. Diabetes Have regular diabetes screenings. This checks your fasting blood sugar level. Have the screening done:  Once every three years after age 54 if you are at a normal weight and have a low risk for diabetes.  More often and at a younger age if you are overweight or have a high risk for  diabetes. What should I know about preventing infection? Hepatitis B If you have a higher risk for hepatitis B, you should be screened for this virus. Talk with your health care provider to find out if you are at risk for hepatitis B infection. Hepatitis C Testing is recommended for:  Everyone born from [birth year] through 1965.  Anyone with known risk factors for hepatitis C. Sexually transmitted infections (STIs)  Get screened for STIs, including gonorrhea and chlamydia, if: ? You are sexually active and are younger than 63 years of age. ? You are older than 64 years of age and your health care provider tells you that you are at risk for this type of infection. ? Your sexual activity has changed since you were last screened, and you are at increased risk for chlamydia or gonorrhea. Ask your health care provider if you are at risk.  Ask your health care provider about whether you are at high risk for HIV. Your health care provider may recommend a prescription medicine to help prevent HIV infection. If you choose to take medicine to prevent HIV, you should first get tested for HIV. You should then be tested every 3 months for as long as you are taking the medicine. Pregnancy  If you are about to stop having your period (premenopausal) and you may become pregnant, seek counseling before you get pregnant.  Take 400 to 800 micrograms (mcg) of folic acid every day if you become pregnant.  Ask for birth control (contraception) if you want to prevent pregnancy. Osteoporosis and menopause Osteoporosis is a disease in which the bones lose minerals and strength with aging. This can result in bone fractures. If you are 52 years old or older, or if you are at risk for osteoporosis and fractures, ask your health care provider if you should:  Be screened for bone loss.  Take a calcium or vitamin D supplement to lower your risk of fractures.  Be given hormone replacement therapy (HRT) to treat symptoms of  menopause. Follow these instructions at home: Lifestyle  Do not use any products that contain nicotine or tobacco, such as cigarettes, e-cigarettes, and chewing tobacco. If you need help quitting, ask your health care provider.  Do not use street drugs.  Do not share needles.  Ask your health care provider for help if you need support or information about quitting drugs. Alcohol use  Do not drink alcohol if: ? Your health care provider tells you not to drink. ? You are pregnant, may  be pregnant, or are planning to become pregnant.  If you drink alcohol: ? Limit how much you use to 0-1 drink a day. ? Limit intake if you are breastfeeding.  Be aware of how much alcohol is in your drink. In the [place] one drink equals one 12 oz bottle of beer (355 mL), one 5 oz glass of wine (148 mL), or one 1 oz glass of hard liquor (44 mL). General instructions  Schedule regular health, dental, and eye exams.  Stay current with your vaccines.  Tell your health care provider if: ? You often feel depressed. ? You have ever been abused or do not feel safe at home. Summary  Adopting a healthy lifestyle and getting preventive care are important in promoting health and wellness.  Follow your health care provider's instructions about healthy diet, exercising, and getting tested or screened for diseases.  Follow your health care provider's instructions on monitoring your cholesterol and blood pressure. This information is not intended to replace advice given to you by your health care provider. Make sure you discuss any questions you have with your health care provider. Document Revised: 05/27/2018 Document Reviewed: 05/27/2018 Elsevier Patient Education  2020 [employer].

## 2020-03-03 LAB — HEPATITIS C ANTIBODY: Hep C Virus Ab: <0.1 s/co ratio (ref 0.0–0.9)

## 2020-03-03 LAB — CBC WITH DIFFERENTIAL/PLATELET
Basophils Absolute: 0 10*3/uL (ref 0.0–0.2)
Basos: 1 %
EOS (ABSOLUTE): 0.2 10*3/uL (ref 0.0–0.4)
Eos: 3 %
Hematocrit: 42.8 % (ref 34.0–46.6)
Hemoglobin: 14.2 g/dL (ref 11.1–15.9)
Immature Grans (Abs): 0 10*3/uL (ref 0.0–0.1)
Immature Granulocytes: 0 %
Lymphocytes Absolute: 0.9 10*3/uL (ref 0.7–3.1)
Lymphs: 16 %
MCH: 32.1 pg (ref 26.6–33.0)
MCHC: 33.2 g/dL (ref 31.5–35.7)
MCV: 97 fL (ref 79–97)
Monocytes Absolute: 0.3 10*3/uL (ref 0.1–0.9)
Monocytes: 5 %
Neutrophils Absolute: 4.3 10*3/uL (ref 1.4–7.0)
Neutrophils: 75 %
Platelets: 180 10*3/uL (ref 150–450)
RBC: 4.42 x10E6/uL (ref 3.77–5.28)
RDW: 12.4 % (ref 11.7–15.4)
WBC: 5.7 10*3/uL (ref 3.4–10.8)

## 2020-03-03 LAB — TSH: TSH: 2.02 u[IU]/mL (ref 0.450–4.500)

## 2020-03-03 LAB — LIPID PANEL
Chol/HDL Ratio: 2.4 ratio (ref 0.0–4.4)
Cholesterol, Total: 135 mg/dL (ref 100–199)
HDL: 57 mg/dL (ref >39)
LDL Chol Calc (NIH): 60 mg/dL (ref 0–99)
Triglycerides: 94 mg/dL (ref 0–149)
VLDL Cholesterol Cal: 18 mg/dL (ref 5–40)

## 2020-03-03 LAB — HIV ANTIBODY (ROUTINE TESTING W REFLEX): HIV Screen 4th Generation wRfx: NONREACTIVE

## 2020-03-09 ENCOUNTER — Telehealth: Payer: Self-pay | Admitting: Family Medicine

## 2020-03-09 NOTE — Telephone Encounter (Signed)
Patient aware and verbalized understanding. °

## 2020-04-06 ENCOUNTER — Other Ambulatory Visit: Payer: Self-pay | Admitting: *Deleted

## 2020-04-06 MED ORDER — FLUTICASONE PROPIONATE 50 MCG/ACT NA SUSP: 1.0000 | Freq: Two times a day (BID) | NASAL | 11 refills | Status: DC

## 2020-04-17 HISTORY — PX: ILEOSTOMY CLOSURE: SHX1784

## 2020-04-18 ENCOUNTER — Telehealth: Payer: Self-pay | Admitting: Family Medicine

## 2020-04-18 NOTE — Telephone Encounter (Signed)
Pharmacy changed in chart as requested

## 2020-04-25 DIAGNOSIS — Z932 Ileostomy status: Secondary | ICD-10-CM | POA: Insufficient documentation

## 2020-06-19 ENCOUNTER — Ambulatory Visit: Admitting: Family Medicine

## 2020-06-19 ENCOUNTER — Encounter (HOSPITAL_COMMUNITY): Payer: Self-pay

## 2020-06-19 ENCOUNTER — Emergency Department (HOSPITAL_COMMUNITY)

## 2020-06-19 ENCOUNTER — Other Ambulatory Visit: Payer: Self-pay

## 2020-06-19 ENCOUNTER — Emergency Department (HOSPITAL_COMMUNITY)
Admission: EM | Admit: 2020-06-19 | Discharge: 2020-06-19 | Disposition: A | Discharge status: Home / Self Care | Attending: Emergency Medicine | Admitting: Emergency Medicine

## 2020-06-19 DIAGNOSIS — R079 Chest pain, unspecified: Secondary | ICD-10-CM | POA: Diagnosis not present

## 2020-06-19 DIAGNOSIS — Z5321 Procedure and treatment not carried out due to patient leaving prior to being seen by health care provider: Secondary | ICD-10-CM | POA: Insufficient documentation

## 2020-06-19 DIAGNOSIS — G43909 Migraine, unspecified, not intractable, without status migrainosus: Secondary | ICD-10-CM | POA: Diagnosis not present

## 2020-06-19 LAB — CBC
HCT: 38.2 % (ref 36.0–46.0)
Hemoglobin: 12.9 g/dL (ref 12.0–15.0)
MCH: 32.7 pg (ref 26.0–34.0)
MCHC: 33.8 g/dL (ref 30.0–36.0)
MCV: 97 fL (ref 80.0–100.0)
Platelets: 158 10*3/uL (ref 150–400)
RBC: 3.94 MIL/uL (ref 3.87–5.11)
RDW: 11.8 % (ref 11.5–15.5)
WBC: 4.2 10*3/uL (ref 4.0–10.5)
nRBC: 0 % (ref 0.0–0.2)

## 2020-06-19 LAB — BASIC METABOLIC PANEL
Anion gap: 10 (ref 5–15)
BUN: 12 mg/dL (ref 8–23)
CO2: 27 mmol/L (ref 22–32)
Calcium: 9.1 mg/dL (ref 8.9–10.3)
Chloride: 104 mmol/L (ref 98–111)
Creatinine, Ser: 1.01 mg/dL — ABNORMAL HIGH (ref 0.44–1.00)
GFR, Estimated: >60 mL/min (ref >60)
Glucose, Bld: 109 mg/dL — ABNORMAL HIGH (ref 70–99)
Potassium: 3.9 mmol/L (ref 3.5–5.1)
Sodium: 141 mmol/L (ref 135–145)

## 2020-06-19 LAB — TROPONIN I (HIGH SENSITIVITY): Troponin I (High Sensitivity): 2 ng/L (ref <18)

## 2020-06-19 NOTE — ED Triage Notes (Signed)
Pt presents with c/o chest pain since Friday. Pt reports the pain is in the center of her chest. Pt reports she initially started with a migraine on Friday and then her migraine progressed into chest pain.

## 2020-06-20 ENCOUNTER — Emergency Department (HOSPITAL_COMMUNITY)
Admission: EM | Admit: 2020-06-20 | Discharge: 2020-06-20 | Disposition: A | Discharge status: Home / Self Care | Attending: Emergency Medicine | Admitting: Emergency Medicine

## 2020-06-20 ENCOUNTER — Telehealth: Payer: Self-pay

## 2020-06-20 ENCOUNTER — Other Ambulatory Visit: Payer: Self-pay

## 2020-06-20 ENCOUNTER — Encounter (HOSPITAL_COMMUNITY): Payer: Self-pay | Admitting: Emergency Medicine

## 2020-06-20 DIAGNOSIS — Z8541 Personal history of malignant neoplasm of cervix uteri: Secondary | ICD-10-CM | POA: Diagnosis not present

## 2020-06-20 DIAGNOSIS — Z79899 Other long term (current) drug therapy: Secondary | ICD-10-CM | POA: Diagnosis not present

## 2020-06-20 DIAGNOSIS — Z01812 Encounter for preprocedural laboratory examination: Secondary | ICD-10-CM | POA: Insufficient documentation

## 2020-06-20 DIAGNOSIS — E039 Hypothyroidism, unspecified: Secondary | ICD-10-CM | POA: Insufficient documentation

## 2020-06-20 DIAGNOSIS — Z87891 Personal history of nicotine dependence: Secondary | ICD-10-CM | POA: Diagnosis not present

## 2020-06-20 DIAGNOSIS — Z0189 Encounter for other specified special examinations: Secondary | ICD-10-CM

## 2020-06-20 NOTE — ED Provider Notes (Signed)
[facility]-EMERGENCY DEPT Provider Note   CSN: [id number] Arrival date & time: 06/20/20  1433     History Chief Complaint  Patient presents with  . wants results from yesterday    [name] is a 65 y.o. female with past medical history of vertigo, anxiety, hypothyroidism, hyperlipidemia that presents emergency department today for lab results.  Patient states that she went to the emergency department yesterday for chest pain, left without being seen.Marland Kitchen  Her PCP did review her labs and her chest x-ray and EKG which were all unremarkable yesterday, but they were unable to contact patient.  They returned here for their lab results from yesterday.  Patient had CBC, BMP, troponin and chest x-ray done.  They did leave before the second troponin resulted.  CBC, BMP troponin and chest x-ray were all unremarkable.  Patient states that she had one episode of chest pain yesterday, resolved spontaneously and is not present now.  Patient states that she is completely asymptomatic.  They do want to be discharged at this time and want their lab work printed out.  No other complaints, no history of cardiac disease.  [name] thinks that her chest pain was related to acid reflux.  Was nonexertional, no shortness of breath, nausea or vomiting.  No associated symptoms.  Pain lasted a couple minutes, and was in the center of her chest and did not radiate anywhere.  HPI     Past Medical History:  Diagnosis Date  . Cancer (HCC)    cervical 2003  . Vertigo     Patient Active Problem List   Diagnosis Date Noted  . Mixed hyperlipidemia 03/02/2020  . 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 Hypothyroidism 06/24/2018  . Smoker 06/24/2018  . History of cervical cancer 06/24/2018  . Essential tremor 05/08/2018  . Chronic daily headache 05/06/2018  . Lumbar spine painful on movement 05/06/2018  . Vertigo 05/06/2018  . Leg cramps 05/06/2018  . Allergic rhinitis 04/09/2017  . Anxiety  04/09/2017  . Need for immunization against influenza 04/09/2017  . Annual physical exam 04/09/2017    Past Surgical History:  Procedure Laterality Date  . ABDOMINAL HYSTERECTOMY    . CHOLECYSTECTOMY       OB History   No obstetric history on file.     No family history on file.  Social History   Tobacco Use  . Smoking status: Former Smoker    Quit date: 02/10/2020    Years since quitting: 0.3  . Smokeless tobacco: Never Used  Vaping Use  . Vaping Use: Never used  Substance Use Topics  . Alcohol use: Yes  . Drug use: No    Home Medications Prior to Admission medications   Medication Sig Start Date End Date Taking? Authorizing Provider  acetaminophen (TYLENOL) 500 MG tablet Take 500 mg by mouth daily as needed for moderate pain.    [provider]  fluticasone (FLONASE) 50 MCG/ACT nasal spray Place 1 spray into both nostrils 2 (two) times daily. 04/06/20   [name], DO  levothyroxine (SYNTHROID) 25 MCG tablet Take 1 tablet (25 mcg total) by mouth daily. 03/02/20   Daryll Drown, NP  loratadine (CLARITIN) 10 MG tablet Take 1 tablet (10 mg total) by mouth daily. 03/02/20   Daryll Drown, NP  montelukast (SINGULAIR) 10 MG tablet Take 1 tablet (10 mg total) by mouth at bedtime. 03/02/20   Daryll Drown, NP  primidone (MYSOLINE) 50 MG tablet Take 1 tablet (50 mg  total) by mouth 2 (two) times daily. 03/02/20   Ivy Lynn, NP  simvastatin (ZOCOR) 20 MG tablet Take 1 tablet (20 mg total) by mouth daily at 6 PM. 03/02/20   Ivy Lynn, NP    Allergies    Barium-containing compounds and Percocet [oxycodone-acetaminophen]  Review of Systems   Review of Systems  Constitutional: Negative for chills, diaphoresis, fatigue and fever.  HENT: Negative for congestion, sore throat and trouble swallowing.   Eyes: Negative for pain and visual disturbance.  Respiratory: Negative for cough, shortness of breath and wheezing.   Cardiovascular: Negative for  chest pain, palpitations and leg swelling.  Gastrointestinal: Negative for abdominal distention, abdominal pain, diarrhea, nausea and vomiting.  Genitourinary: Negative for difficulty urinating and flank pain.  Musculoskeletal: Negative for back pain, neck pain and neck stiffness.  Skin: Negative for pallor.  Neurological: Negative for dizziness, speech difficulty, weakness and headaches.  Psychiatric/Behavioral: Negative for confusion.    Physical Exam Updated Vital Signs BP 137/75 (BP Location: Left Arm)   Pulse 72   Temp 98.5 F (36.9 C) (Oral)   Resp 19   Ht 5\' 2"  (1.575 m)   Wt 53.5 kg   SpO2 100%   BMI 21.58 kg/m   Physical Exam Constitutional:      General: She is not in acute distress.    Appearance: Normal appearance. She is not ill-appearing, toxic-appearing or diaphoretic.  Cardiovascular:     Rate and Rhythm: Normal rate and regular rhythm.     Pulses: Normal pulses.  Pulmonary:     Effort: Pulmonary effort is normal. No respiratory distress.     Breath sounds: Normal breath sounds. No stridor. No wheezing or rhonchi.  Abdominal:     General: Abdomen is flat.     Palpations: Abdomen is soft.  Musculoskeletal:        General: Normal range of motion.  Skin:    General: Skin is warm and dry.     Capillary Refill: Capillary refill takes less than 2 seconds.  Neurological:     General: No focal deficit present.     Mental Status: She is alert and oriented to person, place, and time.  Psychiatric:        Mood and Affect: Mood normal.        Behavior: Behavior normal.        Thought Content: Thought content normal.     ED Results / Procedures / Treatments   Labs (all labs ordered are listed, but only abnormal results are displayed) Labs Reviewed - No data to display  EKG None  Radiology DG Chest 2 View  Result Date: 06/19/2020 CLINICAL DATA:  Shortness of breath and chest pain EXAM: CHEST - 2 VIEW COMPARISON:  None. FINDINGS: Lungs are clear. Heart  size and pulmonary vascularity are normal. No adenopathy. There is slight lower thoracic levoscoliosis. There is aortic atherosclerosis. No pneumothorax. IMPRESSION: Lungs clear.  Cardiac silhouette normal. Aortic Atherosclerosis (ICD10-I70.0). Electronically Signed   By: Lowella Grip III M.D.   On: 06/19/2020 13:43    Procedures Procedures (including critical care time)  Medications Ordered in ED Medications - No data to display  ED Course  I have reviewed the triage vital signs and the nursing notes.  Pertinent labs & imaging results that were available during my care of the patient were reviewed by me and considered in my medical decision making (see chart for details).    MDM Rules/Calculators/A&P  TEYANNA GARCIAGONZALEZ is a 65 y.o. female with past medical history of vertigo, anxiety, hypothyroidism, hyperlipidemia that presents emergency department today for lab results.  Patient with unremarkable lab work from yesterday including chest x-ray, CBC, BMP and troponin.  Did discuss lab results with patient and [name] in depth, did print these out for patient.  Did discuss that she did leave without second troponin and what that meant, patient does not want any lab work done at this time, is completely asymptomatic and wants her blood work to printed.  Patient will follow up with PCP if she has symptoms again or return to the emergency department.  Doubt need for further emergent work up at this time. I explained the diagnosis and have given explicit precautions to return to the ER including for any other new or worsening symptoms. The patient understands and accepts the medical plan as it's been dictated and I have answered their questions. Discharge instructions concerning home care and prescriptions have been given. The patient is STABLE and is discharged to home in good condition.   Final Clinical Impression(s) / ED Diagnoses Final diagnoses:  Encounter for  laboratory test    Rx / DC Orders ED Discharge Orders    None       Alfredia Client, PA-C 06/20/20 2332    Carmin Muskrat, MD 06/23/20 1300

## 2020-06-20 NOTE — Telephone Encounter (Signed)
I reviewed her labs and and chest x-ray and EKG. These were all unremarkable. She has known renal impairment but this was stable from previous. She unfortunately left before her delta troponin could be obtained so this does not totally rule out any acute processes but suggests against them at this time

## 2020-06-20 NOTE — ED Notes (Signed)
Patient continuously coming to the nurses station stating she needs someone to come she her she has been in the room for 40 minutes. This nurse explained to the patient that a provider is signed up to see her and will be in as soon as possible. Patient states her husband is in the lobby with her and has been here the whole time so he needs to come back.

## 2020-06-20 NOTE — Telephone Encounter (Signed)
Attempted to contact patient - NA °

## 2020-06-20 NOTE — ED Triage Notes (Signed)
Per pt, states she was called by someone to come back to hospital or her insurance wont pay for her visit yesterday-states she is just here for her results from yesterday

## 2020-06-20 NOTE — Discharge Instructions (Addendum)
Your work-up from yesterday looked good, I did attach these results for you guys.  If you have any symptoms that you did yesterday I would come back to the emergency room or follow-up with your PCP.  You did leave before your second troponin as we discussed.  Use the attached instructions.

## 2020-06-21 DIAGNOSIS — N828 Other female genital tract fistulae: Secondary | ICD-10-CM | POA: Insufficient documentation

## 2020-06-23 NOTE — Telephone Encounter (Signed)
Pt aware of provider feedback and also went back to the ED the next day and stayed to be seen and to complete labs.

## 2020-07-18 ENCOUNTER — Telehealth: Payer: Self-pay

## 2020-07-18 DIAGNOSIS — J309 Allergic rhinitis, unspecified: Secondary | ICD-10-CM

## 2020-07-18 MED ORDER — SIMVASTATIN 20 MG PO TABS: 20.0000 mg | ORAL_TABLET | Freq: Every day | ORAL | 0 refills | Status: DC

## 2020-07-18 MED ORDER — MONTELUKAST SODIUM 10 MG PO TABS: 10.0000 mg | ORAL_TABLET | Freq: Every day | ORAL | 0 refills | Status: DC

## 2020-07-18 MED ORDER — CITALOPRAM HYDROBROMIDE 20 MG PO TABS: 20.0000 mg | ORAL_TABLET | Freq: Every day | ORAL | 0 refills | Status: DC

## 2020-07-18 NOTE — Telephone Encounter (Signed)
  Prescription Request  07/18/2020  What is the name of the medication or equipment?montelukast (SINGULAIR) 10 MG tablet   simvastatin (ZOCOR) 20 MG tablet citalopram (CELEXA) 20 MG tablet   Have you contacted your pharmacy to request a refill? (if applicable) Yes, pt is switching from [facility] to [place] would you like this sent to? [place]    Patient notified that their request is being sent to the clinical staff for review and that they should receive a response within 2 business days.

## 2020-07-18 NOTE — Telephone Encounter (Signed)
Prescriptions sent to [facility]

## 2020-08-03 ENCOUNTER — Ambulatory Visit (INDEPENDENT_AMBULATORY_CARE_PROVIDER_SITE_OTHER): Admitting: Family Medicine

## 2020-08-03 ENCOUNTER — Other Ambulatory Visit: Payer: Self-pay

## 2020-08-03 ENCOUNTER — Encounter: Payer: Self-pay | Admitting: Family Medicine

## 2020-08-03 VITALS — BP 127/77 | HR 56 | Temp 97.6°F | Ht 62.0 in | Wt 122.2 lb

## 2020-08-03 DIAGNOSIS — R42 Dizziness and giddiness: Secondary | ICD-10-CM

## 2020-08-03 DIAGNOSIS — N3000 Acute cystitis without hematuria: Secondary | ICD-10-CM

## 2020-08-03 LAB — MICROSCOPIC EXAMINATION: WBC, UA: >30 /hpf — ABNORMAL HIGH (ref 0–5)

## 2020-08-03 LAB — URINALYSIS, COMPLETE
Bilirubin, UA: NEGATIVE
Glucose, UA: NEGATIVE
Ketones, UA: NEGATIVE
Nitrite, UA: NEGATIVE
Protein,UA: NEGATIVE
Specific Gravity, UA: 1.01 (ref 1.005–1.030)
Urobilinogen, Ur: 0.2 mg/dL (ref 0.2–1.0)
pH, UA: 5 (ref 5.0–7.5)

## 2020-08-03 MED ORDER — METHYLPREDNISOLONE ACETATE 40 MG/ML IJ SUSP
40.0000 mg | Freq: Once | INTRAMUSCULAR | Status: AC
  Administered 2020-08-03: 40 mg via INTRAMUSCULAR

## 2020-08-03 MED ORDER — FLUCONAZOLE 150 MG PO TABS
150.0000 mg | ORAL_TABLET | Freq: Once | ORAL | 1 refills | Status: AC
Start: 2020-08-03 — End: 2020-08-03

## 2020-08-03 MED ORDER — CEPHALEXIN 500 MG PO CAPS: 500.0000 mg | ORAL_CAPSULE | Freq: Two times a day (BID) | ORAL | 0 refills | Status: DC

## 2020-08-03 NOTE — Progress Notes (Signed)
Established Patient Office Visit  Subjective:  Patient ID: [name], female    DOB: [birth year]  Age: 65 y.o. MRN: [id number]  CC:  Chief Complaint  Patient presents with  . Dysuria    HPI [name] presents for dysuria and cloudy, dark urine for about 5 days. She also reports bladder spasm. She denies fever, chills, or blood in her urine. Denies abdominal pain. She does reports pain in her left flank. She also reports some nausea. She denies vomiting.   She also reports vertigo for the last few days. She has a history of this and reports that she usually get a steroid shot when this happens and this is usually helpful. She denies changes in gait, headaches, chest pain, shortness of breath, edema, confusion, falls, or head injury.   Past Medical History:  Diagnosis Date  . Cancer ([place])    cervical 2003  . Vertigo     Past Surgical History:  Procedure Laterality Date  . ABDOMINAL HYSTERECTOMY    . CHOLECYSTECTOMY      No family history on file.  Social History   Socioeconomic History  . Marital status: Married    Spouse name: Not on file  . Number of children: Not on file  . Years of education: Not on file  . Highest education level: Not on file  Occupational History  . Not on file  Tobacco Use  . Smoking status: Former Smoker    Quit date: 02/10/2020    Years since quitting: 0.4  . Smokeless tobacco: Never Used  Vaping Use  . Vaping Use: Never used  Substance and Sexual Activity  . Alcohol use: Yes  . Drug use: No  . Sexual activity: Not on file  Other Topics Concern  . Not on file  Social History Narrative  . Not on file   Social Determinants of Health   Financial Resource Strain: Not on file  Food Insecurity: Not on file  Transportation Needs: Not on file  Physical Activity: Not on file  Stress: Not on file  Social Connections: Not on file  Intimate Partner Violence: Not on file    Outpatient Medications Prior to Visit  Medication Sig  Dispense Refill  . acetaminophen (TYLENOL) 500 MG tablet Take 500 mg by mouth daily as needed for moderate pain.    . citalopram (CELEXA) 20 MG tablet Take 1 tablet (20 mg total) by mouth daily. 90 tablet 0  . fluticasone (FLONASE) 50 MCG/ACT nasal spray Place 1 spray into both nostrils 2 (two) times daily. 48 g 11  . levothyroxine (SYNTHROID) 25 MCG tablet Take 1 tablet (25 mcg total) by mouth daily. 90 tablet 3  . loratadine (CLARITIN) 10 MG tablet Take 1 tablet (10 mg total) by mouth daily. 90 tablet 3  . montelukast (SINGULAIR) 10 MG tablet Take 1 tablet (10 mg total) by mouth at bedtime. 90 tablet 0  . primidone (MYSOLINE) 50 MG tablet Take 1 tablet (50 mg total) by mouth 2 (two) times daily. 180 tablet 3  . simvastatin (ZOCOR) 20 MG tablet Take 1 tablet (20 mg total) by mouth daily at 6 PM. 90 tablet 0   No facility-administered medications prior to visit.    Allergies  Allergen Reactions  . Barium-Containing Compounds     hives  . Percocet [Oxycodone-Acetaminophen] Other (See Comments)    migraine     ROS Review of Systems As per HPI.    Objective:    Physical Exam Vitals  and nursing note reviewed.  Constitutional:      Appearance: Normal appearance.  HENT:     Head: Normocephalic and atraumatic.     Right Ear: Tympanic membrane, ear canal and external ear normal.     Left Ear: Tympanic membrane, ear canal and external ear normal.  Eyes:     Extraocular Movements: Extraocular movements intact.     Pupils: Pupils are equal, round, and reactive to light.  Cardiovascular:     Rate and Rhythm: Normal rate and regular rhythm.     Heart sounds: Normal heart sounds. No murmur heard.   Pulmonary:     Effort: Pulmonary effort is normal. No respiratory distress.     Breath sounds: Normal breath sounds.  Abdominal:     General: Bowel sounds are normal. There is no distension.     Palpations: Abdomen is soft.     Tenderness: There is no abdominal tenderness. There is no  right CVA tenderness, left CVA tenderness, guarding or rebound.  Musculoskeletal:     Right lower leg: No edema.     Left lower leg: No edema.  Skin:    General: Skin is warm and dry.  Neurological:     General: No focal deficit present.     Mental Status: She is alert and oriented to person, place, and time.  Psychiatric:        Mood and Affect: Mood normal.    BP 127/77   Pulse (!) 56   Temp 97.6 F (36.4 C) (Temporal)   Ht 5\' 2"  (1.575 m)   Wt 122 lb 4 oz (55.5 kg)   BMI 22.36 kg/m  Wt Readings from Last 3 Encounters:  08/03/20 122 lb 4 oz (55.5 kg)  06/20/20 118 lb (53.5 kg)  03/02/20 118 lb 12.8 oz (53.9 kg)   Urine dipstick shows positive for RBC's and positive for leukocytes.  Micro exam: >30 WBC's per HPF, 0-2 RBC's per HPF and few + bacteria.   Health Maintenance Due  Topic Date Due  . COLONOSCOPY (Pts 45-30yrs Insurance coverage will need to be confirmed)  Never done  . MAMMOGRAM  Never done    There are no preventive care reminders to display for this patient.  Lab Results  Component Value Date   TSH 2.020 03/02/2020   Lab Results  Component Value Date   WBC 4.2 06/19/2020   HGB 12.9 06/19/2020   HCT 38.2 06/19/2020   MCV 97.0 06/19/2020   PLT 158 06/19/2020   Lab Results  Component Value Date   NA 141 06/19/2020   K 3.9 06/19/2020   CO2 27 06/19/2020   GLUCOSE 109 (H) 06/19/2020   BUN 12 06/19/2020   CREATININE 1.01 (H) 06/19/2020   BILITOT <0.2 03/02/2020   ALKPHOS 109 03/02/2020   AST 27 03/02/2020   ALT 40 (H) 03/02/2020   PROT 6.3 03/02/2020   ALBUMIN 4.2 03/02/2020   CALCIUM 9.1 06/19/2020   ANIONGAP 10 06/19/2020   Lab Results  Component Value Date   CHOL 135 03/02/2020   Lab Results  Component Value Date   HDL 57 03/02/2020   Lab Results  Component Value Date   LDLCALC 60 03/02/2020   Lab Results  Component Value Date   TRIG 94 03/02/2020   Lab Results  Component Value Date   CHOLHDL 2.4 03/02/2020   No results  found for: HGBA1C    Assessment & Plan:   Lucill was seen today for dysuria.  Diagnoses and all  orders for this visit:  Acute cystitis without hematuria UA indication cystitis. Culture pending. Keflex ordered. Diflucan ordered as well as patient often has yeast infections follow antibiotics.  -     Urinalysis, Complete -     cephALEXin (KEFLEX) 500 MG capsule; Take 1 capsule (500 mg total) by mouth 2 (two) times daily. -     fluconazole (DIFLUCAN) 150 MG tablet; Take 1 tablet (150 mg total) by mouth once for 1 dose. -     Urine Culture  Vertigo Declined meclizine today. Steroid IM injection today in office.  -     methylPREDNISolone acetate (DEPO-MEDROL) injection 40 mg  Follow-up: Return if symptoms worsen or fail to improve.   The patient indicates understanding of these issues and agrees with the plan.    Gwenlyn Perking, FNP

## 2020-08-03 NOTE — Patient Instructions (Signed)
Urinary Tract Infection, Adult  A urinary tract infection (UTI) is an infection of any part of the urinary tract. The urinary tract includes the kidneys, ureters, bladder, and urethra. These organs make, store, and get rid of urine in the body. An upper UTI affects the ureters and kidneys. A lower UTI affects the bladder and urethra. What are the causes? Most urinary tract infections are caused by bacteria in your genital area around your urethra, where urine leaves your body. These bacteria grow and cause inflammation of your urinary tract. What increases the risk? You are more likely to develop this condition if:  You have a urinary catheter that stays in place.  You are not able to control when you urinate or have a bowel movement (incontinence).  You are female and you: ? Use a spermicide or diaphragm for birth control. ? Have low estrogen levels. ? Are pregnant.  You have certain genes that increase your risk.  You are sexually active.  You take antibiotic medicines.  You have a condition that causes your flow of urine to slow down, such as: ? An enlarged prostate, if you are female. ? Blockage in your urethra. ? A kidney stone. ? A nerve condition that affects your bladder control (neurogenic bladder). ? Not getting enough to drink, or not urinating often.  You have certain medical conditions, such as: ? Diabetes. ? A weak disease-fighting system (immunesystem). ? Sickle cell disease. ? Gout. ? Spinal cord injury. What are the signs or symptoms? Symptoms of this condition include:  Needing to urinate right away (urgency).  Frequent urination. This may include small amounts of urine each time you urinate.  Pain or burning with urination.  Blood in the urine.  Urine that smells bad or unusual.  Trouble urinating.  Cloudy urine.  Vaginal discharge, if you are female.  Pain in the abdomen or the lower back. You may also have:  Vomiting or a decreased  appetite.  Confusion.  Irritability or tiredness.  A fever or chills.  Diarrhea. The first symptom in older adults may be confusion. In some cases, they may not have any symptoms until the infection has worsened. How is this diagnosed? This condition is diagnosed based on your medical history and a physical exam. You may also have other tests, including:  Urine tests.  Blood tests.  Tests for STIs (sexually transmitted infections). If you have had more than one UTI, a cystoscopy or imaging studies may be done to determine the cause of the infections. How is this treated? Treatment for this condition includes:  Antibiotic medicine.  Over-the-counter medicines to treat discomfort.  Drinking enough water to stay hydrated. If you have frequent infections or have other conditions such as a kidney stone, you may need to see a health care provider who specializes in the urinary tract (urologist). In rare cases, urinary tract infections can cause sepsis. Sepsis is a life-threatening condition that occurs when the body responds to an infection. Sepsis is treated in the hospital with IV antibiotics, fluids, and other medicines. Follow these instructions at home: Medicines  Take over-the-counter and prescription medicines only as told by your health care provider.  If you were prescribed an antibiotic medicine, take it as told by your health care provider. Do not stop using the antibiotic even if you start to feel better. General instructions  Make sure you: ? Empty your bladder often and completely. Do not hold urine for long periods of time. ? Empty your bladder after   sex. ? Wipe from front to back after urinating or having a bowel movement if you are female. Use each tissue only one time when you wipe.  Drink enough fluid to keep your urine pale yellow.  Keep all follow-up visits. This is important.   Contact a health care provider if:  Your symptoms do not get better after 1-2  days.  Your symptoms go away and then return. Get help right away if:  You have severe pain in your back or your lower abdomen.  You have a fever or chills.  You have nausea or vomiting. Summary  A urinary tract infection (UTI) is an infection of any part of the urinary tract, which includes the kidneys, ureters, bladder, and urethra.  Most urinary tract infections are caused by bacteria in your genital area.  Treatment for this condition often includes antibiotic medicines.  If you were prescribed an antibiotic medicine, take it as told by your health care provider. Do not stop using the antibiotic even if you start to feel better.  Keep all follow-up visits. This is important. This information is not intended to replace advice given to you by your health care provider. Make sure you discuss any questions you have with your health care provider. Document Revised: 01/14/2020 Document Reviewed: 01/14/2020 Elsevier Patient Education  2021 [employer]. Vertigo Vertigo is the feeling that you or your surroundings are moving when they are not. This feeling can come and go at any time. Vertigo often goes away on its own. Vertigo can be dangerous if it occurs while you are doing something that could endanger you or others, such as driving or operating machinery. Your health care provider will do tests to determine the cause of your vertigo. Tests will also help your health care provider decide how best to treat your condition. Follow these instructions at home: Eating and drinking  Drink enough fluid to keep your urine pale yellow.  Do not drink alcohol.      Activity  Return to your normal activities as told by your health care provider. Ask your health care provider what activities are safe for you.  In the morning, first sit up on the side of the bed. When you feel okay, stand slowly while you hold onto something until you know that your balance is fine.  Move slowly. Avoid  sudden body or head movements or certain positions, as told by your health care provider.  If you have trouble walking or keeping your balance, try using a cane for stability. If you feel dizzy or unstable, sit down right away.  Avoid doing any tasks that would cause danger to you or others if vertigo occurs.  Avoid bending down if you feel dizzy. Place items in your home so that they are easy for you to reach without leaning over.  Do not drive or use heavy machinery if you feel dizzy. General instructions  Take over-the-counter and prescription medicines only as told by your health care provider.  Keep all follow-up visits as told by your health care provider. This is important. Contact a health care provider if:  Your medicines do not relieve your vertigo or they make it worse.  You have a fever.  Your condition gets worse or you develop new symptoms.  Your family or friends notice any behavioral changes.  Your nausea or vomiting gets worse.  You have numbness or a prickling and tingling sensation in part of your body. Get help right away if you:  Have difficulty moving or speaking.  Are always dizzy.  Faint.  Develop severe headaches.  Have weakness in your hands, arms, or legs.  Have changes in your hearing or vision.  Develop a stiff neck.  Develop sensitivity to light. Summary  Vertigo is the feeling that you or your surroundings are moving when they are not.  Your health care provider will do tests to determine the cause of your vertigo.  Follow instructions for home care. You may be told to avoid certain tasks, positions, or movements.  Contact a health care provider if your medicines do not relieve your symptoms, or if you have a fever, nausea, vomiting, or changes in behavior.  Get help right away if you have severe headaches or difficulty speaking, or you develop hearing or vision problems. This information is not intended to replace advice given to  you by your health care provider. Make sure you discuss any questions you have with your health care provider. Document Revised: 04/27/2018 Document Reviewed: 04/27/2018 Elsevier Patient Education  2021 [employer].

## 2020-08-05 LAB — URINE CULTURE

## 2020-08-30 ENCOUNTER — Other Ambulatory Visit: Payer: Self-pay

## 2020-08-30 ENCOUNTER — Encounter: Payer: Self-pay | Admitting: Family Medicine

## 2020-08-30 ENCOUNTER — Ambulatory Visit: Admitting: Family Medicine

## 2020-08-30 VITALS — BP 105/64 | HR 67 | Temp 97.7°F | Ht 62.0 in | Wt 120.0 lb

## 2020-08-30 DIAGNOSIS — Z720 Tobacco use: Secondary | ICD-10-CM

## 2020-08-30 DIAGNOSIS — E039 Hypothyroidism, unspecified: Secondary | ICD-10-CM | POA: Diagnosis not present

## 2020-08-30 DIAGNOSIS — E782 Mixed hyperlipidemia: Secondary | ICD-10-CM | POA: Diagnosis not present

## 2020-08-30 DIAGNOSIS — Z7689 Persons encountering health services in other specified circumstances: Secondary | ICD-10-CM

## 2020-08-30 DIAGNOSIS — G250 Essential tremor: Secondary | ICD-10-CM | POA: Diagnosis not present

## 2020-08-30 NOTE — Patient Instructions (Signed)
You had labs performed today.  You will be contacted with the results of the labs once they are available, usually in the next 3 business days for routine lab work.  If you have an active my chart account, they will be released to your MyChart.  If you prefer to have these labs released to you via telephone, please let us know.  If you had a pap smear or biopsy performed, expect to be contacted in about 7-10 days.  Schedule physical  I think what you are describing is akathisia (internal tremor feeling)

## 2020-08-30 NOTE — Progress Notes (Signed)
Subjective: CC: Establish care, essential tremor, hypothyroidism PCP: Janora Norlander, DO LKG:MWNU [name] is a 65 y.o. female presenting to clinic today for:  1.  Acquired hypothyroidism/essential tremor Patient reports compliance with Synthroid 25 mcg daily.  No change in voice, difficulty swallowing.  Her tremor is well controlled with the primidone.  Sometimes she does feel like she shakes in the inside however.  She does have chronic diarrhea but has history of bowel surgery and subsequently takes 1 Imodium each morning which seems to control symptoms.  She does report thinning of her eyebrows and therefore would like to get her thyroid checked again today.  No reports of biotin or hair skin and nail vitamin use.  No known family history of Parkinson disease  2.  Tobacco use disorder Patient quit smoking for about a year and a half when she was going through her surgeries.  She subsequently started again and smokes up to 3 cigarettes/day pending what her anxiety level is.  No difficulty swallowing, no sore throat, no hemoptysis, no night sweats or unplanned weight loss.  3.  Allergic rhinitis Patient reports she gets intermittent right ear infections and this has been ongoing for years.  She often needs some type of steroid shot and/or an antibiotic.  She is compliant with the Singulair but notes that she is run out of her Claritin.  She gets Flonase over-the-counter.   ROS: Per HPI  Allergies  Allergen Reactions  . Barium-Containing Compounds     hives  . Percocet [Oxycodone-Acetaminophen] Other (See Comments)    migraine    Past Medical History:  Diagnosis Date  . Cancer ([place])    cervical 2003  . Vertigo     Current Outpatient Medications:  .  acetaminophen (TYLENOL) 500 MG tablet, Take 500 mg by mouth daily as needed for moderate pain., Disp: , Rfl:  .  citalopram (CELEXA) 20 MG tablet, Take 1 tablet (20 mg total) by mouth daily., Disp: 90 tablet, Rfl: 0 .   fluticasone (FLONASE) 50 MCG/ACT nasal spray, Place 1 spray into both nostrils 2 (two) times daily., Disp: 48 g, Rfl: 11 .  levothyroxine (SYNTHROID) 25 MCG tablet, Take 1 tablet (25 mcg total) by mouth daily., Disp: 90 tablet, Rfl: 3 .  loratadine (CLARITIN) 10 MG tablet, Take 1 tablet (10 mg total) by mouth daily., Disp: 90 tablet, Rfl: 3 .  montelukast (SINGULAIR) 10 MG tablet, Take 1 tablet (10 mg total) by mouth at bedtime., Disp: 90 tablet, Rfl: 0 .  primidone (MYSOLINE) 50 MG tablet, Take 1 tablet (50 mg total) by mouth 2 (two) times daily., Disp: 180 tablet, Rfl: 3 .  simvastatin (ZOCOR) 20 MG tablet, Take 1 tablet (20 mg total) by mouth daily at 6 PM., Disp: 90 tablet, Rfl: 0 Social History   Socioeconomic History  . Marital status: Married    Spouse name: Not on file  . Number of children: Not on file  . Years of education: Not on file  . Highest education level: Not on file  Occupational History  . Not on file  Tobacco Use  . Smoking status: Current Some Day Smoker    Packs/day: 0.25  . Smokeless tobacco: Never Used  Vaping Use  . Vaping Use: Never used  Substance and Sexual Activity  . Alcohol use: Yes  . Drug use: No  . Sexual activity: Not on file  Other Topics Concern  . Not on file  Social History Narrative  . Not on  file   Social Determinants of Health   Financial Resource Strain: Not on file  Food Insecurity: Not on file  Transportation Needs: Not on file  Physical Activity: Not on file  Stress: Not on file  Social Connections: Not on file  Intimate Partner Violence: Not on file   Family History  Problem Relation Age of Onset  . Heart disease Father   . Diabetes Father     Objective: Office vital signs reviewed. BP 105/64   Pulse 67   Temp 97.7 Kaitlin (36.5 C) (Temporal)   Ht '5\' 2"'  (1.575 m)   Wt 120 lb (54.4 kg)   SpO2 98%   BMI 21.95 kg/m   Physical Examination:  General: Awake, alert, well nourished, No acute distress HEENT: Normal; sclera  white; no thyromegaly, thyroid masses.  No exophthalmos Cardio: regular rate and rhythm, S1S2 heard, no murmurs appreciated Pulm: clear to auscultation bilaterally, no wheezes, rhonchi or rales; normal work of breathing on room air Extremities: warm, well perfused, No edema, cyanosis or clubbing; +2 pulses bilaterally MSK: Normal gait and station Skin: Purpura noted bilaterally along the forearms Neuro: No tremor  Assessment/ Plan: 65 y.o. female   Acquired hypothyroidism - Plan: TSH, T4, Free, CMP14+EGFR  Mixed hyperlipidemia - Plan: CMP14+EGFR  Essential tremor - Plan: CMP14+EGFR  Establishing care with new doctor, encounter for  Tobacco use  Check thyroid levels.  Given thinning of eyebrows may need to advance Synthroid dose  Plan for fasting lipid panel during her physical.  She had already eaten this morning.  Check metabolic panel.  Slight elevation in creatinine noted in her January labs.  This may be her baseline however.  Liver enzymes were elevated back in September.  Tremor was not notable on exam today.  Continue primidone  Patient is contemplative.  Smoking very little.  I think that she could get off these 3 cigarettes fairly easily without medical intervention  No orders of the defined types were placed in this encounter.  No orders of the defined types were placed in this encounter.    Janora Norlander, DO [place] [phone]

## 2020-08-31 LAB — CMP14+EGFR
ALT: 18 IU/L (ref 0–32)
AST: 19 IU/L (ref 0–40)
Albumin/Globulin Ratio: 3 — ABNORMAL HIGH (ref 1.2–2.2)
Albumin: 4.2 g/dL (ref 3.8–4.8)
Alkaline Phosphatase: 94 IU/L (ref 44–121)
BUN/Creatinine Ratio: 18 (ref 12–28)
BUN: 20 mg/dL (ref 8–27)
Bilirubin Total: <0.2 mg/dL (ref 0.0–1.2)
CO2: 24 mmol/L (ref 20–29)
Calcium: 8.7 mg/dL (ref 8.7–10.3)
Chloride: 102 mmol/L (ref 96–106)
Creatinine, Ser: 1.12 mg/dL — ABNORMAL HIGH (ref 0.57–1.00)
Globulin, Total: 1.4 g/dL — ABNORMAL LOW (ref 1.5–4.5)
Glucose: 100 mg/dL — ABNORMAL HIGH (ref 65–99)
Potassium: 4.8 mmol/L (ref 3.5–5.2)
Sodium: 139 mmol/L (ref 134–144)
Total Protein: 5.6 g/dL — ABNORMAL LOW (ref 6.0–8.5)
eGFR: 55 mL/min/{1.73_m2} — ABNORMAL LOW (ref >59)

## 2020-08-31 LAB — T4, FREE: Free T4: 1.08 ng/dL (ref 0.82–1.77)

## 2020-08-31 LAB — TSH: TSH: 1.16 u[IU]/mL (ref 0.450–4.500)

## 2020-10-10 ENCOUNTER — Ambulatory Visit: Admitting: Nurse Practitioner

## 2020-10-10 ENCOUNTER — Other Ambulatory Visit: Payer: Self-pay

## 2020-10-10 ENCOUNTER — Encounter: Payer: Self-pay | Admitting: Nurse Practitioner

## 2020-10-10 VITALS — BP 124/80 | HR 67 | Temp 97.8°F | Resp 20 | Ht 62.0 in | Wt 121.0 lb

## 2020-10-10 DIAGNOSIS — B3731 Acute candidiasis of vulva and vagina: Secondary | ICD-10-CM

## 2020-10-10 DIAGNOSIS — S161XXA Strain of muscle, fascia and tendon at neck level, initial encounter: Secondary | ICD-10-CM

## 2020-10-10 DIAGNOSIS — B373 Candidiasis of vulva and vagina: Secondary | ICD-10-CM

## 2020-10-10 MED ORDER — KETOROLAC TROMETHAMINE 30 MG/ML IJ SOLN
30.0000 mg | Freq: Once | INTRAMUSCULAR | Status: AC
Start: 2020-10-10 — End: 2020-10-10
  Administered 2020-10-10: 30 mg via INTRAMUSCULAR

## 2020-10-10 MED ORDER — FLUCONAZOLE 150 MG PO TABS: 150.0000 mg | ORAL_TABLET | Freq: Once | ORAL | 0 refills | Status: AC

## 2020-10-10 MED ORDER — CYCLOBENZAPRINE HCL 5 MG PO TABS: 5.0000 mg | ORAL_TABLET | Freq: Three times a day (TID) | ORAL | 0 refills | Status: DC | PRN

## 2020-10-10 NOTE — Progress Notes (Signed)
Subjective:    Patient ID: CERINITY ZYNDA, female    DOB: [birth year], 65 y.o.   MRN: [id number]   Chief Complaint: Neck Pain   HPI Patient comes in today c/o neck pain. She says it started behind left ear and is radiating down into neck. Movement in certain directions causes pain. Started about a week or so ago. She has tried using heating pad which has helped some. Sh ehas not taking in medicine for it. Rates pain 5/10 currently. Pain is intermittent. Nothing makes better na d nothing makes it worse.  * also c/o vaginal itching that started several day sago. Has been eating yogurt with no relief. Started 3 days ago. Itching has goten worse.  Review of Systems  Constitutional: Negative for diaphoresis.  HENT: Positive for ear pain (left).   Eyes: Negative for pain.  Respiratory: Negative for shortness of breath.   Cardiovascular: Negative for chest pain, palpitations and leg swelling.  Gastrointestinal: Negative for abdominal pain.  Endocrine: Negative for polydipsia.  Musculoskeletal: Positive for neck pain.  Skin: Negative for rash.  Neurological: Negative for dizziness, weakness and headaches.  Hematological: Does not bruise/bleed easily.  All other systems reviewed and are negative.      Objective:   Physical Exam Vitals and nursing note reviewed.  Constitutional:      Appearance: Normal appearance.  HENT:     Right Ear: Tympanic membrane normal.     Left Ear: Tympanic membrane normal.     Ears:     Comments: Pain posterior to left ear pn palpation- no nodules noted Cardiovascular:     Rate and Rhythm: Normal rate and regular rhythm.     Heart sounds: Normal heart sounds.  Pulmonary:     Effort: Pulmonary effort is normal.     Breath sounds: Normal breath sounds.  Musculoskeletal:     Comments: Pain on extension and rotation of cervical spine to right.  Skin:    General: Skin is warm.  Neurological:     General: No focal deficit present.     Mental Status: She  is alert and oriented to person, place, and time.  Psychiatric:        Mood and Affect: Mood normal.        Behavior: Behavior normal.    BP 124/80   Pulse 67   Temp 97.8 F (36.6 C) (Temporal)   Resp 20   Ht 5\' 2"  (1.575 m)   Wt 121 lb (54.9 kg)   SpO2 97%   BMI 22.13 kg/m        Assessment & Plan:  SEEMA BLUM in today with chief complaint of Neck Pain   1. Strain of neck muscle, initial encounter Moist heat Stretches Follow up with PCP if does not improve - ketorolac (TORADOL) 30 MG/ML injection 30 mg - cyclobenzaprine (FLEXERIL) 5 MG tablet; Take 1 tablet (5 mg total) by mouth 3 (three) times daily as needed for muscle spasms.  Dispense: 30 tablet; Refill: 0  2. Vaginal candidiasis - fluconazole (DIFLUCAN) 150 MG tablet; Take 1 tablet (150 mg total) by mouth once for 1 dose.  Dispense: 1 tablet; Refill: 0    The above assessment and management plan was discussed with the patient. The patient verbalized understanding of and has agreed to the management plan. Patient is aware to call the clinic if symptoms persist or worsen. Patient is aware when to return to the clinic for a follow-up visit. Patient educated on when it  is appropriate to go to the emergency department.   [name] Done, FNP

## 2020-10-10 NOTE — Patient Instructions (Signed)
Cervical Sprain A cervical sprain is a stretch or tear in one or more of the ligaments in the neck. Ligaments are the tissues that connect bones. Cervical sprains can range from mild to severe. Severe cervical sprains can cause the spinal bones (vertebrae) in the neck to be unstable. This can result in spinal cord damage and in serious nervous system problems. The time that it takes for a cervical sprain to heal depends on the cause and extent of the injury. Most cervical sprains heal in 4-6 weeks. What are the causes? Cervical sprains may be caused by trauma, such as an injury from a motor vehicle accident, a fall, or a sudden forward and backward whipping movement of the head and neck (whiplash injury). Mild cervical sprains may be caused by wear and tear over time. What increases the risk? The following factors may make you more likely to develop this condition:  Participating in activities that have a high risk of trauma to the neck. These include contact sports, auto racing, gymnastics, and diving.  Taking risks when driving or riding in a motor vehicle.  Osteoarthritis of the spine.  Poor strength and flexibility of the neck.  A previous neck injury.  Poor posture.  Spending long periods in certain positions that put stress on the neck, such as sitting at a computer for a long time. What are the signs or symptoms? Symptoms of this condition include:  Pain, soreness, stiffness, tenderness, swelling, or a burning sensation in the front, back, or sides of the neck, shoulders, or upper back.  Sudden tightening of neck muscles (spasms).  Limited ability to move the neck.  Headache.  Dizziness.  Nausea or vomiting.  Weakness, numbness, or tingling in a hand or an arm. Symptoms may develop right away after injury, or they may develop over a few days. In some cases, symptoms may go away with treatment and return (recur) over time. How is this diagnosed? This condition may be  diagnosed based on:  Your medical history.  Your symptoms.  Any recent injuries or known neck problems that you have, such as arthritis in the neck.  A physical exam.  Imaging tests, such as X-rays, MRI, and CT scan. How is this treated? This condition is treated by resting and icing the injured area and doing physical therapy exercises. Heat therapy may be used 2-3 days after the injury occurred if there is no swelling. Depending on the severity of your condition, treatment may also include:  Keeping your neck in place (immobilized) for periods of time. This may be done using: ? A cervical collar. This supports your chin and the back of your head. ? A cervical traction device. This is a sling that holds up your head. The device removes weight and pressure from your neck, and it may help to relieve pain.  Medicines that help to relieve pain and inflammation.  Medicines that help to relax your muscles (muscle relaxants).  Surgery. This is rare. Follow these instructions at home: Medicines  Take over-the-counter and prescription medicines only as told by your health care provider.  Ask your health care provider if the medicine prescribed to you: ? Requires you to avoid driving or using heavy machinery. ? Can cause constipation. You may need to take these actions to prevent or treat constipation:  Drink enough fluid to keep your urine pale yellow.  Take over-the-counter or prescription medicines.  Eat foods that are high in fiber, such as beans, whole grains, and fresh fruits   and vegetables.  Limit foods that are high in fat and processed sugars, such as fried or sweet foods.   If you have a cervical collar:  Wear the collar as told by your health care provider. Do not remove it unless told.  Ask before making any adjustments to your collar.  If you have long hair, keep it outside of the collar.  Ask your health care provider if you may remove the collar for cleaning and  bathing. If so: ? Follow instructions about how to remove it safely. ? Clean it by hand with mild soap and water and air-dry it completely. ? If your collar has removable pads, remove them every 1-2 days and wash them by hand with soap and water. Let them air-dry completely before putting them back in the collar.  Tell your health care provider if your skin under the collar has irritation or sores. Managing pain, stiffness, and swelling  If directed, use a cervical traction device as told.  If directed, put ice on the affected area. To do this: ? Put ice in a plastic bag. ? Place a towel between your skin and the bag. ? Leave the ice on for 20 minutes, 2-3 times a day.  If directed, apply heat to the affected area before you do your physical therapy or as often as told by your health care provider. Use the heat source that your health care provider recommends, such as a moist heat pack or a heating pad. ? Place a towel between your skin and the heat source. ? Leave the heat on for 20-30 minutes. ? Remove the heat if your skin turns bright red. This is especially important if you are unable to feel pain, heat, or cold. You may have a greater risk of getting burned.      Activity  Do not drive while wearing a cervical collar. If you do not have a cervical collar, ask if it is safe to drive while your neck heals.  Do not lift anything that is heavier than 10 lb (4.5 kg), or the limit that you are told, until your health care provider says that it is safe.  Rest as told by your health care provider.  If physical therapy was prescribed, do exercises as told by your health care provider or physical therapist.  Return to your normal activities as told by your health care provider. Avoid positions and activities that make your symptoms worse. Ask your health care provider what activities are safe for you. General instructions  Do not use any products that contain nicotine or tobacco, such  as cigarettes, e-cigarettes, and chewing tobacco. These can delay healing. If you need help quitting, ask your health care provider.  Keep all follow-up visits as told by your health care provider or physical therapist. This is important. How is this prevented? To prevent a cervical sprain from happening again:  Use and maintain good posture. Make any needed adjustments to your workstation to help you do this.  Exercise regularly as told by your health care provider or physical therapist.  Avoid risky activities that may cause a cervical sprain. Contact a health care provider if you have:  Symptoms that get worse or do not get better after 2 weeks of treatment.  Pain that gets worse or does not get better with medicine.  New, unexplained symptoms.  Sores or irritated skin on your neck from wearing your cervical collar. Get help right away if:  You have severe pain.    You develop numbness, tingling, or weakness in any part of your body.  You cannot move a part of your body (you have paralysis).  You have neck pain along with severe dizziness or headache. Summary  A cervical sprain is a stretch or tear in one or more of the ligaments in the neck.  Cervical sprains may be caused by trauma, such as an injury from a motor vehicle accident, a fall, or a sudden forward and backward whipping movement of the head and neck (whiplash injury).  Symptoms may develop right away after injury, or they may develop over a few days.  This condition may be treated with rest, ice, heat, medicines, physical therapy, and surgery. This information is not intended to replace advice given to you by your health care provider. Make sure you discuss any questions you have with your health care provider. Document Revised: 02/10/2019 Document Reviewed: 02/10/2019 Elsevier Patient Education  2021 [employer]  

## 2020-10-18 ENCOUNTER — Other Ambulatory Visit: Payer: Self-pay | Admitting: Family Medicine

## 2020-10-18 DIAGNOSIS — J309 Allergic rhinitis, unspecified: Secondary | ICD-10-CM

## 2020-10-25 ENCOUNTER — Other Ambulatory Visit: Payer: Self-pay

## 2020-10-25 ENCOUNTER — Encounter: Payer: Self-pay | Admitting: Nurse Practitioner

## 2020-10-25 ENCOUNTER — Ambulatory Visit: Admitting: Nurse Practitioner

## 2020-10-25 VITALS — BP 116/80 | HR 70 | Temp 97.9°F | Ht 62.0 in | Wt 117.0 lb

## 2020-10-25 DIAGNOSIS — K5900 Constipation, unspecified: Secondary | ICD-10-CM | POA: Diagnosis not present

## 2020-10-25 DIAGNOSIS — R110 Nausea: Secondary | ICD-10-CM | POA: Diagnosis not present

## 2020-10-25 MED ORDER — ONDANSETRON HCL 4 MG PO TABS: 4.0000 mg | ORAL_TABLET | Freq: Three times a day (TID) | ORAL | 0 refills | Status: DC | PRN

## 2020-10-25 MED ORDER — POLYETHYLENE GLYCOL 3350 17 GM/SCOOP PO POWD
17.0000 g | Freq: Two times a day (BID) | ORAL | 1 refills | Status: DC | PRN
Start: 2020-10-25 — End: 2020-12-11

## 2020-10-25 NOTE — Assessment & Plan Note (Signed)
Zofran 4 mg tablet by mouth as needed every 8 hours.

## 2020-10-25 NOTE — Progress Notes (Signed)
Acute Office Visit  Subjective:    Patient ID: [name], female    DOB: 12/11/1955, 65 y.o.   MRN: [id number]  Chief Complaint  Patient presents with  . Constipation    Constipation This is a new problem. Episode onset: In the last 3 days. The problem is unchanged. Stool frequency: One-time in the last 3 to 4 days. The patient is not on a high fiber diet. There has been adequate water intake. Associated symptoms include nausea and vomiting. Pertinent negatives include no abdominal pain, diarrhea or rectal pain. She has tried laxatives for the symptoms. The treatment provided no relief. Her past medical history is significant for abdominal surgery.    Past Medical History:  Diagnosis Date  . Cancer ([place])    cervical 2003  . Vertigo     Past Surgical History:  Procedure Laterality Date  . ABDOMINAL HYSTERECTOMY    . CHOLECYSTECTOMY      Family History  Problem Relation Age of Onset  . Heart disease Father   . Diabetes Father   . Parkinson's disease Neg Hx     Social History   Socioeconomic History  . Marital status: Married    Spouse name: Not on file  . Number of children: 1  . Years of education: Not on file  . Highest education level: Not on file  Occupational History  . Occupation: retired    Comment: Curator  Tobacco Use  . Smoking status: Current Some Day Smoker    Packs/day: 0.15  . Smokeless tobacco: Never Used  Vaping Use  . Vaping Use: Never used  Substance and Sexual Activity  . Alcohol use: Yes  . Drug use: No  . Sexual activity: Not on file  Other Topics Concern  . 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 acetaminophen (TYLENOL) 500 MG tablet Take 500 mg by mouth daily as needed for moderate pain.    . citalopram (CELEXA) 20 MG tablet Take 1 tablet (20 mg total) by mouth daily. 90 tablet 0  . fluticasone (FLONASE) 50 MCG/ACT nasal spray Place 1 spray into both nostrils 2 (two) times daily. 48 g 11  . levothyroxine (SYNTHROID) 25 MCG tablet Take 1 tablet (25 mcg total) by mouth daily. 90 tablet 3  . montelukast (SINGULAIR) 10 MG tablet TAKE ONE TABLET AT BEDTIME 90 tablet 1  . primidone (MYSOLINE) 50 MG tablet Take 1 tablet (50 mg total) by mouth 2 (two) times daily. 180 tablet 3  . simvastatin (ZOCOR) 20 MG tablet TAKE 1 TABLET ONCE DAILY AT 6PM 90 tablet 0  . cyclobenzaprine (FLEXERIL) 5 MG tablet Take 1 tablet (5 mg total) by mouth 3 (three) times daily as needed for muscle spasms. 30 tablet 0  . loratadine (CLARITIN) 10 MG tablet Take 1 tablet (10 mg total) by mouth daily. 90 tablet 3   No facility-administered medications prior to visit.  Allergies  Allergen Reactions  . Barium-Containing Compounds     hives  . Percocet [Oxycodone-Acetaminophen] Other (See Comments)    migraine     Review of Systems  Gastrointestinal: Positive for constipation, nausea and vomiting. Negative for abdominal pain, anal bleeding, blood in stool, diarrhea and rectal pain.  All other systems reviewed and are negative.      Objective:    Physical Exam Vitals and nursing note reviewed.  Constitutional:      Appearance: Normal appearance.  HENT:     Head: Normocephalic.     Nose: Nose normal.  Eyes:     Conjunctiva/sclera: Conjunctivae normal.  Cardiovascular:     Pulses: Normal pulses.     Heart sounds: Normal heart sounds.  Pulmonary:     Effort: Pulmonary effort is normal.     Breath sounds:  Normal breath sounds.  Abdominal:     General: Bowel sounds are normal. There is no distension.     Tenderness: There is no abdominal tenderness. There is no right CVA tenderness, left CVA tenderness or guarding.  Skin:    Findings: No rash.  Neurological:     Mental Status: She is alert and oriented to person, place, and time.  Psychiatric:        Behavior: Behavior normal.     BP 116/80   Pulse 70   Temp 97.9 F (36.6 C) (Temporal)   Ht '5\' 2"'  (1.575 m)   Wt 117 lb (53.1 kg)   SpO2 94%   BMI 21.40 kg/m  Wt Readings from Last 3 Encounters:  10/25/20 117 lb (53.1 kg)  10/10/20 121 lb (54.9 kg)  08/30/20 120 lb (54.4 kg)    Health Maintenance Due  Topic Date Due  . COLONOSCOPY (Pts 45-70yr Insurance coverage will need to be confirmed)  Never done  . MAMMOGRAM  Never done    There are no preventive care reminders to display for this patient.   Lab Results  Component Value Date   TSH 1.160 08/30/2020   Lab Results  Component Value Date   WBC 4.2 06/19/2020   HGB 12.9 06/19/2020   HCT 38.2 06/19/2020   MCV 97.0 06/19/2020   PLT 158 06/19/2020   Lab Results  Component Value Date   NA 139 08/30/2020   K 4.8 08/30/2020   CO2 24 08/30/2020   GLUCOSE 100 (H) 08/30/2020   BUN 20 08/30/2020   CREATININE 1.12 (H) 08/30/2020   BILITOT <0.2 08/30/2020   ALKPHOS 94 08/30/2020   AST 19 08/30/2020   ALT 18 08/30/2020   PROT 5.6 (L) 08/30/2020   ALBUMIN 4.2 08/30/2020   CALCIUM 8.7 08/30/2020   ANIONGAP 10 06/19/2020   EGFR 55 (L) 08/30/2020   Lab Results  Component Value Date   CHOL 135 03/02/2020   Lab Results  Component Value Date   HDL 57 03/02/2020   Lab Results  Component Value Date   LDLCALC 60 03/02/2020   Lab Results  Component Value Date   TRIG 94 03/02/2020   Lab Results  Component Value Date   CHOLHDL 2.4 03/02/2020   No results found for: HGBA1C     Assessment & Plan:   Problem List Items Addressed This Visit      Other    Constipation    Increase hydration and fiber in diet, on assessment patient has bowel sounds, patient is passing gas.  MiraLAX twice daily for 7 days. Education provided to patient with printed handouts given. Follow-up for  worsening unresolved symptoms.      Relevant Medications   polyethylene glycol powder (GLYCOLAX/MIRALAX) 17 GM/SCOOP powder   ondansetron (ZOFRAN) 4 MG tablet   Nausea - Primary    Zofran 4 mg tablet by mouth as needed every 8 hours.      Relevant Medications   polyethylene glycol powder (GLYCOLAX/MIRALAX) 17 GM/SCOOP powder   ondansetron (ZOFRAN) 4 MG tablet       Meds ordered this encounter  Medications  . polyethylene glycol powder (GLYCOLAX/MIRALAX) 17 GM/SCOOP powder    Sig: Take 17 g by mouth 2 (two) times daily as needed.    Dispense:  3350 g    Refill:  1    Order Specific Question:   Supervising Provider    Answer:   Janora Norlander [[id number]]  . ondansetron (ZOFRAN) 4 MG tablet    Sig: Take 1 tablet (4 mg total) by mouth every 8 (eight) hours as needed for nausea or vomiting.    Dispense:  20 tablet    Refill:  0    Order Specific Question:   Supervising Provider    Answer:   Janora Norlander [[id number]]     Ivy Lynn, NP

## 2020-10-25 NOTE — Assessment & Plan Note (Signed)
Increase hydration and fiber in diet, on assessment patient has bowel sounds, patient is passing gas.  MiraLAX twice daily for 7 days. Education provided to patient with printed handouts given. Follow-up for worsening unresolved symptoms.

## 2020-10-25 NOTE — Patient Instructions (Signed)
Constipation, Adult Constipation is when a person has trouble pooping (having a bowel movement). When you have this condition, you may poop fewer than 3 times a week. Your poop (stool) may also be dry, hard, or bigger than normal. Follow these instructions at home: Eating and drinking  Eat foods that have a lot of fiber, such as: ? Fresh fruits and vegetables. ? Whole grains. ? Beans.  Eat less of foods that are low in fiber and high in fat and sugar, such as: ? [place] fries. ? Hamburgers. ? Cookies. ? Candy. ? Soda.  Drink enough fluid to keep your pee (urine) pale yellow.   General instructions  Exercise regularly or as told by your doctor. Try to do 150 minutes of exercise each week.  Go to the restroom when you feel like you need to poop. Do not hold it in.  Take over-the-counter and prescription medicines only as told by your doctor. These include any fiber supplements.  When you poop: ? Do deep breathing while relaxing your lower belly (abdomen). ? Relax your pelvic floor. The pelvic floor is a group of muscles that support the rectum, bladder, and intestines (as well as the uterus in women).  Watch your condition for any changes. Tell your doctor if you notice any.  Keep all follow-up visits as told by your doctor. This is important. Contact a doctor if:  You have pain that gets worse.  You have a fever.  You have not pooped for 4 days.  You vomit.  You are not hungry.  You lose weight.  You are bleeding from the opening of the butt (anus).  You have thin, pencil-like poop. Get help right away if:  You have a fever, and your symptoms suddenly get worse.  You leak poop or have blood in your poop.  Your belly feels hard or bigger than normal (bloated).  You have very bad belly pain.  You feel dizzy or you faint. Summary  Constipation is when a person poops fewer than 3 times a week, has trouble pooping, or has poop that is dry, hard, or bigger than  normal.  Eat foods that have a lot of fiber.  Drink enough fluid to keep your pee (urine) pale yellow.  Take over-the-counter and prescription medicines only as told by your doctor. These include any fiber supplements. This information is not intended to replace advice given to you by your health care provider. Make sure you discuss any questions you have with your health care provider. Document Revised: 04/21/2019 Document Reviewed: 04/21/2019 Elsevier Patient Education  2021 [place]. Nausea, Adult Nausea is feeling sick to your stomach or feeling that you are about to throw up (vomit). Feeling sick to your stomach is usually not serious, but it may be an early sign of a more serious medical problem. As you feel sicker to your stomach, you may throw up. If you throw up, or if you are not able to drink enough fluids, there is a risk that you may lose too much water in your body (get dehydrated). If you lose too much water in your body, you may:  Feel tired.  Feel thirsty.  Have a dry mouth.  Have cracked lips.  Go pee (urinate) less often. Older adults and people who have other diseases or a weak body defense system (immune system) have a higher risk of losing too much water in the body. The main goals of treating this condition are:  To relieve your nausea.  To ensure your nausea occurs less often.  To prevent throwing up and losing too much fluid. Follow these instructions at home: Watch your symptoms for any changes. Tell your doctor about them. Follow these instructions as told by your doctor. Eating and drinking  Take an ORS (oral rehydration solution). This is a drink that is sold at pharmacies and stores.  Drink clear fluids in small amounts as you are able. These include: ? Water. ? Ice chips. ? Fruit juice that has water added (diluted fruit juice). ? Low-calorie sports drinks.  Eat bland, easy-to-digest foods in small amounts as you are able, such  as: ? Bananas. ? Applesauce. ? Rice. ? Low-fat (lean) meats. ? Toast. ? Crackers.  Avoid drinking fluids that have a lot of sugar or caffeine in them. This includes energy drinks, sports drinks, and soda.  Avoid alcohol.  Avoid spicy or fatty foods.      General instructions  Take over-the-counter and prescription medicines only as told by your doctor.  Rest at home while you get better.  Drink enough fluid to keep your pee (urine) pale yellow.  Take slow and deep breaths when you feel sick to your stomach.  Avoid food or things that have strong smells.  Wash your hands often with soap and water. If you cannot use soap and water, use hand sanitizer.  Make sure that all people in your home wash their hands well and often.  Keep all follow-up visits as told by your doctor. This is important. Contact a doctor if:  You feel sicker to your stomach.  You feel sick to your stomach for more than 2 days.  You throw up.  You are not able to drink fluids without throwing up.  You have new symptoms.  You have a fever.  You have a headache.  You have muscle cramps.  You have a rash.  You have pain while peeing.  You feel light-headed or dizzy. Get help right away if:  You have pain in your chest, neck, arm, or jaw.  You feel very weak or you pass out (faint).  You have throw up that is bright red or looks like coffee grounds.  You have bloody or black poop (stools) or poop that looks like tar.  You have a very bad headache, a stiff neck, or both.  You have very bad pain, cramping, or bloating in your belly (abdomen).  You have trouble breathing or you are breathing very quickly.  Your heart is beating very quickly.  Your skin feels cold and clammy.  You feel confused.  You have signs of losing too much water in your body, such as: ? Dark pee, very little pee, or no pee. ? Cracked lips. ? Dry mouth. ? Sunken eyes. ? Sleepiness. ? Weakness. These  symptoms may be an emergency. Do not wait to see if the symptoms will go away. Get medical help right away. Call your local emergency services (911 in the [place] Do not drive yourself to the hospital. Summary  Nausea is feeling sick to your stomach or feeling that you are about to throw up (vomit).  If you throw up, or if you are not able to drink enough fluids, there is a risk that you may lose too much water in your body (get dehydrated).  Eat and drink what your doctor tells you. Take over-the-counter and prescription medicines only as told by your doctor.  Contact a doctor right away if your symptoms get worse  or you have new symptoms.  Keep all follow-up visits as told by your doctor. This is important. This information is not intended to replace advice given to you by your health care provider. Make sure you discuss any questions you have with your health care provider. Document Revised: 05/04/2019 Document Reviewed: 11/11/2017 Elsevier Patient Education  2021 [employer].

## 2020-10-26 ENCOUNTER — Telehealth: Payer: Self-pay | Admitting: Family Medicine

## 2020-10-26 NOTE — Telephone Encounter (Signed)
Appointment given with on call provider for tomorrow morning.

## 2020-10-26 NOTE — Telephone Encounter (Signed)
Pt is not feeling any better after trying what Je suggested at her appt yesterday. She is still throwing up, unable to use the bathroom and her stomach is still swelling.

## 2020-10-26 NOTE — Telephone Encounter (Signed)
Patient aware and refuses to go to ER.

## 2020-10-27 ENCOUNTER — Ambulatory Visit: Admitting: Family Medicine

## 2020-10-27 ENCOUNTER — Other Ambulatory Visit: Payer: Self-pay

## 2020-10-27 ENCOUNTER — Ambulatory Visit (INDEPENDENT_AMBULATORY_CARE_PROVIDER_SITE_OTHER)

## 2020-10-27 ENCOUNTER — Encounter: Payer: Self-pay | Admitting: Family Medicine

## 2020-10-27 VITALS — BP 117/81 | HR 72 | Temp 98.1°F | Ht 62.0 in | Wt 115.4 lb

## 2020-10-27 DIAGNOSIS — Z9889 Other specified postprocedural states: Secondary | ICD-10-CM

## 2020-10-27 DIAGNOSIS — K56609 Unspecified intestinal obstruction, unspecified as to partial versus complete obstruction: Secondary | ICD-10-CM | POA: Insufficient documentation

## 2020-10-27 DIAGNOSIS — K5900 Constipation, unspecified: Secondary | ICD-10-CM

## 2020-10-27 DIAGNOSIS — N1831 Chronic kidney disease, stage 3a: Secondary | ICD-10-CM | POA: Diagnosis not present

## 2020-10-27 DIAGNOSIS — E039 Hypothyroidism, unspecified: Secondary | ICD-10-CM | POA: Diagnosis not present

## 2020-10-27 MED ORDER — TRULANCE 3 MG PO TABS
1.0000 | ORAL_TABLET | Freq: Every day | ORAL | 0 refills | Status: DC
Start: 2020-10-27 — End: 2020-12-11

## 2020-10-27 NOTE — Progress Notes (Signed)
Subjective: CC: Constipation PCP: Janora Norlander, DO VQQ:VZDG F Rotundo is a 65 y.o. female presenting to clinic today for:  1.  Constipation Patient with history of ileostomy.  Over the last couple weeks has been having issues with constipation.  Following the ileostomy reversal, she had quite a bit of diarrhea and in fact took Imodium to help this.  The diarrhea resolved but now she has constipation.  This is refractory to MiraLAX.  She is nauseated but not having any vomiting.  She admits to decreased p.o. intake secondary to the nausea.  The nausea is refractory to Zofran.  Denies any fevers, hematochezia or melena.  She is passing gas.  She has had not had any fevers.  The ostomy site has healed well.  She has not yet contacted her specialist with regards to the ongoing constipation.  2.  Hypothyroidism Patient admits to constipation as above and that her eyebrows seem to be thinning.   ROS: Per HPI  Allergies  Allergen Reactions  . Barium-Containing Compounds     hives  . Percocet [Oxycodone-Acetaminophen] Other (See Comments)    migraine    Past Medical History:  Diagnosis Date  . Cancer ([place])    cervical 2003  . Vertigo     Current Outpatient Medications:  .  acetaminophen (TYLENOL) 500 MG tablet, Take 500 mg by mouth daily as needed for moderate pain., Disp: , Rfl:  .  citalopram (CELEXA) 20 MG tablet, Take 1 tablet (20 mg total) by mouth daily., Disp: 90 tablet, Rfl: 0 .  fluticasone (FLONASE) 50 MCG/ACT nasal spray, Place 1 spray into both nostrils 2 (two) times daily., Disp: 48 g, Rfl: 11 .  levothyroxine (SYNTHROID) 25 MCG tablet, Take 1 tablet (25 mcg total) by mouth daily., Disp: 90 tablet, Rfl: 3 .  montelukast (SINGULAIR) 10 MG tablet, TAKE ONE TABLET AT BEDTIME, Disp: 90 tablet, Rfl: 1 .  ondansetron (ZOFRAN) 4 MG tablet, Take 1 tablet (4 mg total) by mouth every 8 (eight) hours as needed for nausea or vomiting., Disp: 20 tablet, Rfl: 0 .  polyethylene  glycol powder (GLYCOLAX/MIRALAX) 17 GM/SCOOP powder, Take 17 g by mouth 2 (two) times daily as needed., Disp: 3350 g, Rfl: 1 .  primidone (MYSOLINE) 50 MG tablet, Take 1 tablet (50 mg total) by mouth 2 (two) times daily., Disp: 180 tablet, Rfl: 3 .  simvastatin (ZOCOR) 20 MG tablet, TAKE 1 TABLET ONCE DAILY AT 6PM, Disp: 90 tablet, Rfl: 0 Social History   Socioeconomic History  . Marital status: Married    Spouse name: Not on file  . Number of children: 1  . Years of education: Not on file  . Highest education level: Not on file  Occupational History  . Occupation: retired    Comment: Curator  Tobacco Use  . Smoking status: Current Some Day Smoker    Packs/day: 0.15  . Smokeless tobacco: Never Used  Vaping Use  . Vaping Use: Never used  Substance and Sexual Activity  . Alcohol use: Yes  . Drug use: No  . Sexual activity: Not on file  Other Topics Concern  . 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 Heart disease Father   . Diabetes Father   . Parkinson's disease Neg Hx     Objective: Office vital signs reviewed. BP 117/81   Pulse 72   Temp 98.1 F (36.7 C)   Ht 5\' 2"  (1.575 m)   Wt 115 lb 6.4 oz (52.3 kg)   SpO2 94%   BMI 21.11 kg/m   Physical Examination:  General: Awake, alert, well nourished, No acute distress HEENT: no exophthalmos, no goiter GI: soft, non-tender, non-distended, bowel sounds present x4, no hepatomegaly, no splenomegaly, no masses.  Assessment/  Plan: 65 y.o. female   Constipation, unspecified constipation type - Plan: DG Abd 1 View  Acquired hypothyroidism - Plan: TSH, T4, Free  Stage 3a chronic kidney disease ([place]) - Plan: Renal Function Panel  History of ileostomy - Plan: DG Abd 1 View  Obtain KUB given history of GI surgery recently.  I think that diarrhea and constipation are typical after this type of surgery but would like to rule out any evidence of partial obstruction or other complications.  Her exam today was benign.  Trial of Trulance.  I considered GI cleanout with MiraLAX but unsure that she would be able to handle the volume given nausea.  Samples of Trulance provided. Hydrate.  Will cc results to surgeon. Red flags discussed.  Check renal function panel given decreased p.o. intake as well as TSH and free T4 given other reports concerning for inadequately treated hypothyroidism  No orders of the defined types were placed in this encounter.  No orders of the defined types were placed in this encounter.    Janora Norlander, DO [place] [phone]

## 2020-10-27 NOTE — Telephone Encounter (Signed)
Pt called stating that she just saw Dr Lajuana Ripple this morning and wanted to let her know that she is on her way to [facility]. Says Dr Lajuana Ripple may get a call from a lady names Renelda Mom.

## 2020-10-28 LAB — RENAL FUNCTION PANEL
Albumin: 4.6 g/dL (ref 3.8–4.8)
BUN/Creatinine Ratio: 25 (ref 12–28)
BUN: 38 mg/dL — ABNORMAL HIGH (ref 8–27)
CO2: 27 mmol/L (ref 20–29)
Calcium: 9.5 mg/dL (ref 8.7–10.3)
Chloride: 93 mmol/L — ABNORMAL LOW (ref 96–106)
Creatinine, Ser: 1.55 mg/dL — ABNORMAL HIGH (ref 0.57–1.00)
Glucose: 147 mg/dL — ABNORMAL HIGH (ref 65–99)
Phosphorus: 4.1 mg/dL (ref 3.0–4.3)
Potassium: 4.1 mmol/L (ref 3.5–5.2)
Sodium: 137 mmol/L (ref 134–144)
eGFR: 37 mL/min/{1.73_m2} — ABNORMAL LOW (ref >59)

## 2020-10-28 LAB — TSH: TSH: 1.39 u[IU]/mL (ref 0.450–4.500)

## 2020-10-28 LAB — T4, FREE: Free T4: 1.36 ng/dL (ref 0.82–1.77)

## 2020-10-30 ENCOUNTER — Other Ambulatory Visit: Payer: Self-pay | Admitting: Family Medicine

## 2020-10-30 DIAGNOSIS — E039 Hypothyroidism, unspecified: Secondary | ICD-10-CM

## 2020-10-30 MED ORDER — LEVOTHYROXINE SODIUM 25 MCG PO TABS: 25.0000 ug | ORAL_TABLET | Freq: Every day | ORAL | 3 refills | Status: DC

## 2020-11-08 HISTORY — PX: ABDOMINAL ADHESION SURGERY: SHX90

## 2020-11-20 ENCOUNTER — Encounter: Payer: Self-pay | Admitting: Family Medicine

## 2020-11-20 ENCOUNTER — Other Ambulatory Visit: Payer: Self-pay

## 2020-11-20 ENCOUNTER — Ambulatory Visit (INDEPENDENT_AMBULATORY_CARE_PROVIDER_SITE_OTHER): Admitting: Family Medicine

## 2020-11-20 DIAGNOSIS — R399 Unspecified symptoms and signs involving the genitourinary system: Secondary | ICD-10-CM | POA: Diagnosis not present

## 2020-11-20 DIAGNOSIS — T753XXD Motion sickness, subsequent encounter: Secondary | ICD-10-CM

## 2020-11-20 LAB — URINALYSIS, COMPLETE
Bilirubin, UA: NEGATIVE
Glucose, UA: NEGATIVE
Ketones, UA: NEGATIVE
Nitrite, UA: NEGATIVE
Protein,UA: NEGATIVE
Specific Gravity, UA: 1.01 (ref 1.005–1.030)
Urobilinogen, Ur: 0.2 mg/dL (ref 0.2–1.0)
pH, UA: 6 (ref 5.0–7.5)

## 2020-11-20 LAB — MICROSCOPIC EXAMINATION: Epithelial Cells (non renal): NONE SEEN /hpf (ref 0–10)

## 2020-11-20 MED ORDER — SCOPOLAMINE 1 MG/3DAYS TD PT72: 1.0000 | MEDICATED_PATCH | TRANSDERMAL | 12 refills | Status: DC

## 2020-11-20 MED ORDER — CEPHALEXIN 500 MG PO CAPS: 500.0000 mg | ORAL_CAPSULE | Freq: Four times a day (QID) | ORAL | 0 refills | Status: DC

## 2020-11-20 NOTE — Progress Notes (Signed)
Virtual Visit via telephone Note  I connected with [name] on 11/20/20 at 1541 by telephone and verified that I am speaking with the correct person using two identifiers. [name] is currently located at home and patient are currently with her during visit. The provider, Fransisca Kaufmann Vilda Zollner, MD is located in their office at time of visit.  Call ended at 1548  I discussed the limitations, risks, security and privacy concerns of performing an evaluation and management service by telephone and the availability of in person appointments. I also discussed with the patient that there may be a patient responsible charge related to this service. The patient expressed understanding and agreed to proceed.   History and Present Illness: Patient is calling in for uti symptoms.  She gets these recurrently since cervical cancer.  She just got out the hospital after bowel issues and surgery.  She is having dysuria and achiness in both legs.  She also gets some irritation in perineal region.   No diagnosis found.  Outpatient Encounter Medications as of 11/20/2020  Medication Sig  . acetaminophen (TYLENOL) 500 MG tablet Take 500 mg by mouth daily as needed for moderate pain.  . citalopram (CELEXA) 20 MG tablet Take 1 tablet (20 mg total) by mouth daily.  . fluticasone (FLONASE) 50 MCG/ACT nasal spray Place 1 spray into both nostrils 2 (two) times daily.  Marland Kitchen levothyroxine (SYNTHROID) 25 MCG tablet Take 1 tablet (25 mcg total) by mouth daily.  . montelukast (SINGULAIR) 10 MG tablet TAKE ONE TABLET AT BEDTIME  . ondansetron (ZOFRAN) 4 MG tablet Take 1 tablet (4 mg total) by mouth every 8 (eight) hours as needed for nausea or vomiting.  Marland Kitchen Plecanatide (TRULANCE) 3 MG TABS Take 1 capsule by mouth daily.  . polyethylene glycol powder (GLYCOLAX/MIRALAX) 17 GM/SCOOP powder Take 17 g by mouth 2 (two) times daily as needed.  . primidone (MYSOLINE) 50 MG tablet Take 1 tablet (50 mg total) by mouth 2 (two)  times daily.  . simvastatin (ZOCOR) 20 MG tablet TAKE 1 TABLET ONCE DAILY AT 6PM   No facility-administered encounter medications on file as of 11/20/2020.    Review of Systems  Constitutional: Negative for chills and fever.  Eyes: Negative for visual disturbance.  Respiratory: Negative for chest tightness and shortness of breath.   Cardiovascular: Negative for chest pain and leg swelling.  Gastrointestinal: Negative for abdominal pain.  Genitourinary: Positive for dysuria, frequency and urgency. Negative for difficulty urinating, hematuria, vaginal bleeding, vaginal discharge and vaginal pain.  Musculoskeletal: Negative for back pain and gait problem.  Skin: Negative for rash.  Neurological: Negative for light-headedness and headaches.  Psychiatric/Behavioral: Negative for agitation and behavioral problems.  All other systems reviewed and are negative.   Observations/Objective: Patient sounds comfortable and in no acute distress  Assessment and Plan: Problem List Items Addressed This Visit   None   Visit Diagnoses    UTI symptoms    -  Primary   Relevant Medications   cephALEXin (KEFLEX) 500 MG capsule   Other Relevant Orders   Urinalysis, Complete   Urine Culture   Motion sickness, subsequent encounter       Relevant Medications   scopolamine (TRANSDERM-SCOP, 1.5 MG,) 1 MG/3DAYS      Will treat like uti and give motion sickness pills Follow up plan: Return if symptoms worsen or fail to improve.     I discussed the assessment and treatment plan with the patient. The patient was provided  an opportunity to ask questions and all were answered. The patient agreed with the plan and demonstrated an understanding of the instructions.   The patient was advised to call back or seek an in-person evaluation if the symptoms worsen or if the condition fails to improve as anticipated.  The above assessment and management plan was discussed with the patient. The patient verbalized  understanding of and has agreed to the management plan. Patient is aware to call the clinic if symptoms persist or worsen. Patient is aware when to return to the clinic for a follow-up visit. Patient educated on when it is appropriate to go to the emergency department.    I provided 7 minutes of non-face-to-face time during this encounter.    Worthy Rancher, MD

## 2020-11-24 LAB — URINE CULTURE

## 2020-11-27 ENCOUNTER — Other Ambulatory Visit: Payer: Self-pay | Admitting: Family Medicine

## 2020-11-27 DIAGNOSIS — R399 Unspecified symptoms and signs involving the genitourinary system: Secondary | ICD-10-CM

## 2020-11-27 MED ORDER — CIPROFLOXACIN HCL 500 MG PO TABS: 500.0000 mg | ORAL_TABLET | Freq: Two times a day (BID) | ORAL | 0 refills | Status: AC

## 2020-11-27 NOTE — Progress Notes (Signed)
Lmtcb.

## 2020-11-27 NOTE — Progress Notes (Signed)
Pt returning call

## 2020-12-11 ENCOUNTER — Encounter: Payer: Self-pay | Admitting: Family Medicine

## 2020-12-11 ENCOUNTER — Other Ambulatory Visit: Payer: Self-pay

## 2020-12-11 ENCOUNTER — Ambulatory Visit: Admitting: Family Medicine

## 2020-12-11 VITALS — Temp 98.4°F | Ht 62.0 in | Wt 120.2 lb

## 2020-12-11 DIAGNOSIS — N179 Acute kidney failure, unspecified: Secondary | ICD-10-CM | POA: Diagnosis not present

## 2020-12-11 DIAGNOSIS — Z8669 Personal history of other diseases of the nervous system and sense organs: Secondary | ICD-10-CM | POA: Diagnosis not present

## 2020-12-11 DIAGNOSIS — R739 Hyperglycemia, unspecified: Secondary | ICD-10-CM | POA: Diagnosis not present

## 2020-12-11 DIAGNOSIS — R42 Dizziness and giddiness: Secondary | ICD-10-CM | POA: Diagnosis not present

## 2020-12-11 NOTE — Progress Notes (Signed)
Subjective: CC: Dizziness PCP: Janora Norlander, DO TZG:[id number] F Vanmeter is a 65 y.o. female presenting to clinic today for:  1.  Dizziness Patient reports this is a chronic issue that has been intermittent for years.  She has a history of right-sided recurrent ear infection.  She is never seen an ear nose and throat doctor nor a neurologist for symptoms.  She read online that she might benefit for something called the Epley maneuver and she would like to get more information about this.  She describes dizziness as lightheadedness, similar to the sensation that you have when you stand up too quickly.  Occasionally she has had some room spinning but this is not the predominant symptom.  Symptoms did seem to be resolved by use of scopolamine patch while she was in the hospital.   ROS: Per HPI  Allergies  Allergen Reactions   Barium-Containing Compounds     hives   Percocet [Oxycodone-Acetaminophen] Other (See Comments)    migraine    Past Medical History:  Diagnosis Date   Cancer ([place])    cervical 2003   Vertigo     Current Outpatient Medications:    acetaminophen (TYLENOL) 500 MG tablet, Take 500 mg by mouth daily as needed for moderate pain., Disp: , Rfl:    fluticasone (FLONASE) 50 MCG/ACT nasal spray, Place 1 spray into both nostrils 2 (two) times daily., Disp: 48 g, Rfl: 11   levothyroxine (SYNTHROID) 25 MCG tablet, Take 1 tablet (25 mcg total) by mouth daily., Disp: 90 tablet, Rfl: 3   montelukast (SINGULAIR) 10 MG tablet, TAKE ONE TABLET AT BEDTIME, Disp: 90 tablet, Rfl: 1   ondansetron (ZOFRAN) 4 MG tablet, Take 1 tablet (4 mg total) by mouth every 8 (eight) hours as needed for nausea or vomiting., Disp: 20 tablet, Rfl: 0   primidone (MYSOLINE) 50 MG tablet, Take 1 tablet (50 mg total) by mouth 2 (two) times daily., Disp: 180 tablet, Rfl: 3   scopolamine (TRANSDERM-SCOP, 1.5 MG,) 1 MG/3DAYS, Place 1 patch (1.5 mg total) onto the skin every 3 (three) days., Disp: 10 patch,  Rfl: 12   simvastatin (ZOCOR) 20 MG tablet, TAKE 1 TABLET ONCE DAILY AT 6PM, Disp: 90 tablet, Rfl: 0 Social History   Socioeconomic History   Marital status: Married    Spouse name: Not on file   Number of children: 1   Years of education: Not on file   Highest education level: Not on file  Occupational History   Occupation: retired    Comment: Scientist, water quality garden department  Tobacco Use   Smoking status: Some Days    Packs/day: 0.15    Pack years: 0.00    Types: Cigarettes   Smokeless tobacco: Never  Vaping Use   Vaping Use: Never used  Substance and Sexual Activity   Alcohol use: Yes   Drug use: No   Sexual activity: Not on file  Other Topics Concern   Not on file  Social History Narrative   Patient is a retired Scientist, 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 Temp 98.4 F (36.9 C)   Ht 5\' 2"  (1.575 m)   Wt 120 lb 3.2 oz (54.5 kg)   BMI 21.98 kg/m   Physical Examination:  General: Awake, alert, well nourished, No acute distress Extremities: warm, well perfused, No edema, cyanosis or clubbing; +2 pulses bilaterally MSK: Normal gait and station Neuro: Cranial nerves II through XII are grossly intact.  Normal upper extremity and lower extremity cerebellar testing.  Orthostatic VS for the past 72 hrs (Last 3 readings):  Orthostatic BP Orthostatic Pulse  12/11/20 0945 133/90 69  12/11/20 0944 131/89 65  12/11/20 0939 119/79 61      Assessment/ Plan: 65 y.o. female   Dizziness - Plan: Vitamin B12, CBC, Ambulatory referral to ENT  History of recurrent ear infection - Plan: Ambulatory referral to ENT  AKI (acute kidney injury) ([place]) - Plan: Basic Metabolic Panel  Elevated serum glucose - Plan: Bayer DCA Hb A1c Waived  There are no focal neurologic deficits.  Her ear exam was significant for mild cerumen with associated dry skin but no other focal abnormalities.  Advised to use Debrox to clear cerumen.  Given ongoing symptoms, I think that it is worth seeing an ear nose and throat doctor. ?  Mnire's disease?  Versus damage secondary to recurrent ear infections  I do not think that she has BPPV but I have given her the Epley maneuver as requested.  We discussed this would likely not improve her symptoms.  Offered meclizine but this has not been helpful in the past.  She had a slight drop in systolic blood pressure from lying to sitting position but otherwise orthostatic vital signs were normal.   No orders of the defined types were placed in this encounter.  No orders of the defined types were placed in this encounter.    Janora Norlander, DO [place] [phone]

## 2020-12-11 NOTE — Patient Instructions (Signed)
DEBROX to clean ear out.  I'm not convinced you have BPPV (benign paroxysmal positional vertigo).  I DO wonder if you have inner ear damage from recurrent ear infections.  Referral to ENT placed.    Dizziness Dizziness is a common problem. It is a feeling of unsteadiness or light-headedness. You may feel like you are about to faint. Dizziness can lead to injury if you stumble or fall. Anyone can become dizzy, but dizziness is more common in older adults. This condition can be caused by a number ofthings, including medicines, dehydration, or illness. Follow these instructions at home: Eating and drinking  Drink enough fluid to keep your urine pale yellow. This helps to keep you from becoming dehydrated. Try to drink more clear fluids, such as water. Do not drink alcohol. Limit your caffeine intake if told to do so by your health care provider. Check ingredients and nutrition facts to see if a food or beverage contains caffeine. Limit your salt (sodium) intake if told to do so by your health care provider. Check ingredients and nutrition facts to see if a food or beverage contains sodium.  Activity  Avoid making quick movements. Rise slowly from chairs and steady yourself until you feel okay. In the morning, first sit up on the side of the bed. When you feel okay, stand slowly while you hold onto something until you know that your balance is good. If you need to stand in one place for a long time, move your legs often. Tighten and relax the muscles in your legs while you are standing. Do not drive or use machinery if you feel dizzy. Avoid bending down if you feel dizzy. Place items in your home so that they are easy for you to reach without leaning over.  Lifestyle Do not use any products that contain nicotine or tobacco. These products include cigarettes, chewing tobacco, and vaping devices, such as e-cigarettes. If you need help quitting, ask your health care provider. Try to reduce your  stress level by using methods such as yoga or meditation. Talk with your health care provider if you need help to manage your stress. General instructions Watch your dizziness for any changes. Take over-the-counter and prescription medicines only as told by your health care provider. Talk with your health care provider if you think that your dizziness is caused by a medicine that you are taking. Tell a friend or a family member that you are feeling dizzy. If he or she notices any changes in your behavior, have this person call your health care provider. Keep all follow-up visits. This is important. Contact a health care provider if: Your dizziness does not go away or you have new symptoms. Your dizziness or light-headedness gets worse. You feel nauseous. You have reduced hearing. You have a fever. You have neck pain or a stiff neck. Your dizziness leads to an injury or a fall. Get help right away if: You vomit or have diarrhea and are unable to eat or drink anything. You have problems talking, walking, swallowing, or using your arms, hands, or legs. You feel generally weak. You have any bleeding. You are not thinking clearly or you have trouble forming sentences. It may take a friend or family member to notice this. You have chest pain, abdominal pain, shortness of breath, or sweating. Your vision changes or you develop a severe headache. These symptoms may represent a serious problem that is an emergency. Do not wait to see if the symptoms will go away.  Get medical help right away. Call your local emergency services (911 in the [place] Do not drive yourself to the hospital. Summary Dizziness is a feeling of unsteadiness or light-headedness. This condition can be caused by a number of things, including medicines, dehydration, or illness. Anyone can become dizzy, but dizziness is more common in older adults. Drink enough fluid to keep your urine pale yellow. Do not drink alcohol. Avoid  making quick movements if you feel dizzy. Monitor your dizziness for any changes. This information is not intended to replace advice given to you by your health care provider. Make sure you discuss any questions you have with your healthcare provider. Document Revised: 05/08/2020 Document Reviewed: 05/08/2020 Elsevier Patient Education  2022 [employer].

## 2020-12-12 LAB — BASIC METABOLIC PANEL
BUN/Creatinine Ratio: 13 (ref 12–28)
BUN: 14 mg/dL (ref 8–27)
CO2: 23 mmol/L (ref 20–29)
Calcium: 9.4 mg/dL (ref 8.7–10.3)
Chloride: 104 mmol/L (ref 96–106)
Creatinine, Ser: 1.1 mg/dL — ABNORMAL HIGH (ref 0.57–1.00)
Glucose: 82 mg/dL (ref 65–99)
Potassium: 4.9 mmol/L (ref 3.5–5.2)
Sodium: 143 mmol/L (ref 134–144)
eGFR: 56 mL/min/{1.73_m2} — ABNORMAL LOW (ref >59)

## 2020-12-12 LAB — CBC
Hematocrit: 34.1 % (ref 34.0–46.6)
Hemoglobin: 11.6 g/dL (ref 11.1–15.9)
MCH: 32 pg (ref 26.6–33.0)
MCHC: 34 g/dL (ref 31.5–35.7)
MCV: 94 fL (ref 79–97)
Platelets: 185 10*3/uL (ref 150–450)
RBC: 3.62 x10E6/uL — ABNORMAL LOW (ref 3.77–5.28)
RDW: 12.4 % (ref 11.7–15.4)
WBC: 5.2 10*3/uL (ref 3.4–10.8)

## 2020-12-12 LAB — VITAMIN B12: Vitamin B-12: 291 pg/mL (ref 232–1245)

## 2020-12-12 LAB — BAYER DCA HB A1C WAIVED: HB A1C (BAYER DCA - WAIVED): 4.7 % (ref <7.0)

## 2020-12-13 ENCOUNTER — Telehealth: Payer: Self-pay | Admitting: Family Medicine

## 2020-12-13 ENCOUNTER — Ambulatory Visit: Admitting: Nurse Practitioner

## 2020-12-13 ENCOUNTER — Encounter: Payer: Self-pay | Admitting: Nurse Practitioner

## 2020-12-13 ENCOUNTER — Other Ambulatory Visit: Payer: Self-pay

## 2020-12-13 VITALS — BP 101/61 | HR 70 | Temp 98.0°F | Ht 62.0 in | Wt 120.0 lb

## 2020-12-13 DIAGNOSIS — H6501 Acute serous otitis media, right ear: Secondary | ICD-10-CM

## 2020-12-13 MED ORDER — AMOXICILLIN-POT CLAVULANATE 875-125 MG PO TABS: 1.0000 | ORAL_TABLET | Freq: Two times a day (BID) | ORAL | 0 refills | Status: DC

## 2020-12-13 NOTE — Telephone Encounter (Signed)
Patient needs OV - called lmtcb

## 2020-12-13 NOTE — Patient Instructions (Signed)
Earache, Adult An earache, or ear pain, can be caused by many things, including: An infection. Ear wax buildup. Ear pressure. Something in the ear that should not be there (foreign body). A sore throat. Tooth problems. Jaw problems. Treatment of the earache will depend on the cause. If the cause is not clear or cannot be determined, you may need to watch your symptoms until your earache goes away or until a cause is found. Follow these instructions at home: Medicines Take or apply over-the-counter and prescription medicines only as told by your health care provider. If you were prescribed an antibiotic medicine, use it as told by your health care provider. Do not stop using the antibiotic even if you start to feel better. Do not put anything in your ear other than medicine that is prescribed by your health care provider. Managing pain If directed, apply heat to the affected area as often as told by your health care provider. Use the heat source that your health care provider recommends, such as a moist heat pack or a heating pad. Place a towel between your skin and the heat source. Leave the heat on for 20-30 minutes. Remove the heat if your skin turns bright red. This is especially important if you are unable to feel pain, heat, or cold. You may have a greater risk of getting burned. If directed, put ice on the affected area as often as told by your health care provider. To do this:   Put ice in a plastic bag. Place a towel between your skin and the bag. Leave the ice on for 20 minutes, 2-3 times a day. General instructions Pay attention to any changes in your symptoms. Try resting in an upright position instead of lying down. This may help to reduce pressure in your ear and relieve pain. Chew gum if it helps to relieve your ear pain. Treat any allergies as told by your health care provider. Drink enough fluid to keep your urine pale yellow. It is up to you to get the results of any  tests that were done. Ask your health care provider, or the department that is doing the tests, when your results will be ready. Keep all follow-up visits as told by your health care provider. This is important. Contact a health care provider if: Your pain does not improve within 2 days. Your earache gets worse. You have new symptoms. You have a fever. Get help right away if you: Have a severe headache. Have a stiff neck. Have trouble swallowing. Have redness or swelling behind your ear. Have fluid or blood coming from your ear. Have hearing loss. Feel dizzy. Summary An earache, or ear pain, can be caused by many things. Treatment of the earache will depend on the cause. Follow recommendations from your health care provider to treat your ear pain. If the cause is not clear or cannot be determined, you may need to watch your symptoms until your earache goes away or until a cause is found. Keep all follow-up visits as told by your health care provider. This is important. This information is not intended to replace advice given to you by your health care provider. Make sure you discuss any questions you have with your health care provider. Document Revised: 01/09/2019 Document Reviewed: 01/09/2019 Elsevier Patient Education  2022 [employer]vier Inc[employer]  

## 2020-12-13 NOTE — Assessment & Plan Note (Signed)
Unresolved symptoms of throbbing, roaring in tenderness of bilateral ear.  In the last few days this is not new for patient.  Patient has had a history of multiple ear infection in the past she mentions she gets injections to relieve her symptoms.  On assessment right ear has impacted cerumen, tender mastoid bone and slightly warm to touch., left ear drums fluid-filled, I provided education to patient, with cessation of cigarette to help with recurrent ear infection.  Patient verbalized understanding. Augmentin 875-105 mg tablet sent to pharmacy.  Follow-up with worsening or unresolved symptoms.

## 2020-12-13 NOTE — Progress Notes (Signed)
Acute Office Visit  Subjective:    Patient ID: [name], female    DOB: 11-10-1955, 65 y.o.   MRN: [id number]  Chief Complaint  Patient presents with   Ear Pain    Otalgia  There is pain in the right ear. This is a new problem. The current episode started in the past 7 days. The problem has been gradually worsening. There has been no fever. The pain is mild. Pertinent negatives include no ear discharge, hearing loss or rash. Associated symptoms comments: Roaring, throbbing. She has tried nothing for the symptoms. The treatment provided no relief.    Past Medical History:  Diagnosis Date   Cancer ([place])    cervical 2003   Vertigo     Past Surgical History:  Procedure Laterality Date   ABDOMINAL HYSTERECTOMY     CHOLECYSTECTOMY      Family History  Problem Relation Age of Onset   Heart disease Father    Diabetes Father    Parkinson's disease Neg Hx     Social History   Socioeconomic History   Marital status: Married    Spouse name: Not on file   Number of children: 1   Years of education: Not on file   Highest education level: Not on file  Occupational History   Occupation: retired    Comment: Scientist, water quality garden department  Tobacco Use   Smoking status: Some Days    Packs/day: 0.15    Pack years: 0.00    Types: Cigarettes   Smokeless tobacco: Never  Vaping Use   Vaping Use: Never used  Substance and Sexual Activity   Alcohol use: Yes   Drug use: No   Sexual activity: Not on file  Other Topics Concern   Not on file  Social History Narrative   Patient is a retired Scientist, 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     fluticasone (FLONASE) 50 MCG/ACT nasal spray Place 1 spray into both nostrils 2 (two) times daily. 48 g 11   levothyroxine (SYNTHROID) 25 MCG tablet Take 1 tablet (25 mcg total) by mouth daily. 90 tablet 3   montelukast (SINGULAIR) 10 MG tablet TAKE ONE TABLET AT BEDTIME 90 tablet 1   ondansetron (ZOFRAN) 4 MG tablet Take 1 tablet (4 mg total) by mouth every 8 (eight) hours as needed for nausea or vomiting. 20 tablet 0   primidone (MYSOLINE) 50 MG tablet Take 1 tablet (50 mg total) by mouth 2 (two) times daily. 180 tablet 3   scopolamine (TRANSDERM-SCOP, 1.5 MG,) 1 MG/3DAYS Place 1 patch (1.5 mg total) onto the skin every 3 (three) days. 10 patch 12   simvastatin (ZOCOR) 20 MG tablet TAKE 1 TABLET ONCE DAILY AT 6PM 90 tablet 0   No facility-administered medications prior to visit.    Allergies  Allergen Reactions   Barium-Containing Compounds     hives   Percocet [  Oxycodone-Acetaminophen] Other (See Comments)    migraine     Review of Systems  Constitutional: Negative.   HENT:  Positive for ear pain. Negative for ear discharge and hearing loss.   Eyes: Negative.   Respiratory: Negative.    Cardiovascular: Negative.   Skin:  Negative for rash.  All other systems reviewed and are negative.     Objective:    Physical Exam Vitals and nursing note reviewed.  Constitutional:      Appearance: Normal appearance.  HENT:     Head: Normocephalic.     Right Ear: Hearing normal. Tenderness present. No drainage. There is impacted cerumen. No foreign body. There is mastoid tenderness.     Left Ear: Hearing normal. A middle ear effusion is present. There is no impacted cerumen. No foreign body. No mastoid tenderness.     Nose: Nose normal.  Eyes:     Conjunctiva/sclera: Conjunctivae normal.   Cardiovascular:     Rate and Rhythm: Normal rate and regular rhythm.  Pulmonary:     Effort: Pulmonary effort is normal.     Breath sounds: Normal breath sounds.  Abdominal:     General: Bowel sounds are normal.  Skin:    Findings: No rash.  Neurological:     Mental Status: She is alert and oriented to person, place, and time.  Psychiatric:        Behavior: Behavior normal.    BP 101/61   Pulse 70   Temp 98 F (36.7 C) (Temporal)   Ht $R'5\' 2"'ty$  (1.575 m)   Wt 120 lb (54.4 kg)   BMI 21.95 kg/m  Wt Readings from Last 3 Encounters:  12/13/20 120 lb (54.4 kg)  12/11/20 120 lb 3.2 oz (54.5 kg)  10/27/20 115 lb 6.4 oz (52.3 kg)    Health Maintenance Due  Topic Date Due   Pneumococcal Vaccine 47-23 Years old (1 - PCV) Never done   COLONOSCOPY (Pts 45-19yrs Insurance coverage will need to be confirmed)  Never done   MAMMOGRAM  Never done    There are no preventive care reminders to display for this patient.   Lab Results  Component Value Date   TSH 1.390 10/27/2020   Lab Results  Component Value Date   WBC 5.2 12/11/2020   HGB 11.6 12/11/2020   HCT 34.1 12/11/2020   MCV 94 12/11/2020   PLT 185 12/11/2020   Lab Results  Component Value Date   NA 143 12/11/2020   K 4.9 12/11/2020   CO2 23 12/11/2020   GLUCOSE 82 12/11/2020   BUN 14 12/11/2020   CREATININE 1.10 (H) 12/11/2020   BILITOT <0.2 08/30/2020   ALKPHOS 94 08/30/2020   AST 19 08/30/2020   ALT 18 08/30/2020   PROT 5.6 (L) 08/30/2020   ALBUMIN 4.6 10/27/2020   CALCIUM 9.4 12/11/2020   ANIONGAP 10 06/19/2020   EGFR 56 (L) 12/11/2020   Lab Results  Component Value Date   CHOL 135 03/02/2020   Lab Results  Component Value Date   HDL 57 03/02/2020   Lab Results  Component Value Date   LDLCALC 60 03/02/2020   Lab Results  Component Value Date   TRIG 94 03/02/2020   Lab Results  Component Value Date   CHOLHDL 2.4 03/02/2020   Lab Results  Component Value Date   HGBA1C 4.7 12/11/2020        Assessment & Plan:   Problem List Items Addressed This Visit       Nervous and  Auditory   Non-recurrent acute serous otitis media of right ear - Primary    Unresolved symptoms of throbbing, roaring in tenderness of bilateral ear.  In the last few days this is not new for patient.  Patient has had a history of multiple ear infection in the past she mentions she gets injections to relieve her symptoms.  On assessment right ear has impacted cerumen, tender mastoid bone and slightly warm to touch., left ear drums fluid-filled, I provided education to patient, with cessation of cigarette to help with recurrent ear infection.  Patient verbalized understanding. Augmentin 875-105 mg tablet sent to pharmacy.  Follow-up with worsening or unresolved symptoms.       Relevant Medications   amoxicillin-clavulanate (AUGMENTIN) 875-125 MG tablet     Meds ordered this encounter  Medications   amoxicillin-clavulanate (AUGMENTIN) 875-125 MG tablet    Sig: Take 1 tablet by mouth 2 (two) times daily.    Dispense:  14 tablet    Refill:  0    Order Specific Question:   Supervising Provider    Answer:   Janora Norlander [[id number]]     Ivy Lynn, NP

## 2020-12-13 NOTE — Telephone Encounter (Signed)
  Incoming Patient Call  12/13/2020  What symptoms do you have? Ear is swelling and pt says she knows there is fluid (ear infection)  How long have you been sick? A few days   Have you been seen for this problem? Yes   If your provider decides to give you a prescription, which pharmacy would you like for it to be sent to? [facility]    Patient informed that this information will be sent to the clinical staff for review and that they should receive a follow up call.

## 2020-12-14 MED ORDER — CIPROFLOXACIN-DEXAMETHASONE 0.3-0.1 % OT SUSP: 4.0000 [drp] | Freq: Two times a day (BID) | OTIC | 0 refills | Status: DC

## 2020-12-14 NOTE — Telephone Encounter (Signed)
Pt got the message but still wants to talk to nurse please call back

## 2020-12-14 NOTE — Telephone Encounter (Signed)
Pt called back - will try the drops - referral is still pending

## 2020-12-14 NOTE — Telephone Encounter (Signed)
Dr Darnell Level - I called and spoke with pt. She is upset that she has had 2 visits this week for this ear issue and it is still not resolved. She would also like to discuss her visit with Je. Aware that you are off today and will try to reach out on Friday.

## 2020-12-14 NOTE — Telephone Encounter (Signed)
Pt asked for a call back from [facility] tomorrow morning. I asked what for and she did not want to say. Pt aware that she [name] is not here today.

## 2020-12-14 NOTE — Telephone Encounter (Signed)
Pt called and aware by VM - that Dr Darnell Level would like her to try the ear Gtts that is being called into [facility] pharm now.

## 2020-12-14 NOTE — Telephone Encounter (Signed)
When seen 3 days ago, her ear exam showed absolutely no infection but did have some cerumen.  Ok to send ciprodex ear drops to pharmacy. Apply 4 drops into affected ear twice daily x7 days.  SEE ENT!

## 2020-12-14 NOTE — Telephone Encounter (Signed)
Lmtcb ac 06/30

## 2021-01-03 ENCOUNTER — Other Ambulatory Visit: Payer: Self-pay | Admitting: Family Medicine

## 2021-01-03 ENCOUNTER — Ambulatory Visit (INDEPENDENT_AMBULATORY_CARE_PROVIDER_SITE_OTHER): Admitting: Family Medicine

## 2021-01-03 ENCOUNTER — Other Ambulatory Visit: Payer: Self-pay

## 2021-01-03 ENCOUNTER — Ambulatory Visit (INDEPENDENT_AMBULATORY_CARE_PROVIDER_SITE_OTHER)

## 2021-01-03 ENCOUNTER — Telehealth: Payer: Self-pay | Admitting: Family Medicine

## 2021-01-03 ENCOUNTER — Encounter: Payer: Self-pay | Admitting: Family Medicine

## 2021-01-03 ENCOUNTER — Ambulatory Visit (HOSPITAL_COMMUNITY)
Admission: RE | Admit: 2021-01-03 | Discharge: 2021-01-03 | Disposition: A | Discharge status: Home / Self Care | Source: Ambulatory Visit | Attending: Family Medicine | Admitting: Family Medicine

## 2021-01-03 VITALS — BP 118/63 | HR 70 | Temp 97.6°F | Ht 62.0 in | Wt 120.0 lb

## 2021-01-03 DIAGNOSIS — R600 Localized edema: Secondary | ICD-10-CM | POA: Diagnosis not present

## 2021-01-03 DIAGNOSIS — M79662 Pain in left lower leg: Secondary | ICD-10-CM | POA: Diagnosis not present

## 2021-01-03 MED ORDER — CIPROFLOXACIN HCL 500 MG PO TABS: 500.0000 mg | ORAL_TABLET | Freq: Two times a day (BID) | ORAL | 0 refills | Status: DC

## 2021-01-03 NOTE — Progress Notes (Signed)
Subjective:  Patient ID: [name], female    DOB: 1955/10/16  Age: 65 y.o. MRN: [id number]  CC: Leg Pain (Left lower)   HPI [name] presents for Shin bone is sore. Surgery in May.  Leg swelled a little after. Used compression stocking. Then developed pain.  The surgery was for abdominal scar tissue. Pain started in the last few days. Swelling noted prior. [name] elevated as well, but swelling came back.  Tick bite last week. Only present for a day or less. Showered one night it wasn't there. Noticed it the next day.   Depression screen [facility] 2/9 01/03/2021 12/13/2020 10/27/2020  Decreased Interest 0 0 0  Down, Depressed, Hopeless 0 0 0  PHQ - 2 Score 0 0 0  Altered sleeping - - 0  Tired, decreased energy - - 0  Change in appetite - - 0  Feeling bad or failure about yourself  - - 0  Trouble concentrating - - 0  Moving slowly or fidgety/restless - - 0  Suicidal thoughts - - 0  PHQ-9 Score - - 0    History [name] has a past medical history of Cancer ([place]) and Vertigo.   [name] has a past surgical history that includes Abdominal hysterectomy and Cholecystectomy.   Her family history includes Diabetes in her father; Heart disease in her father.[name] reports that [name] has been smoking cigarettes. [name] has been smoking an average of .15 packs per day. [name] has never used smokeless tobacco. [name] reports current alcohol use. [name] reports that [name] does not use drugs.    ROS Review of Systems  Constitutional: Negative.   HENT: Negative.    Respiratory:  Negative for shortness of breath.   Cardiovascular:  Positive for leg swelling.  Gastrointestinal:  Negative for abdominal pain.  Musculoskeletal:  Positive for arthralgias, joint swelling (left ankle) and myalgias.   Objective:  BP 118/63   Pulse 70   Temp 97.6 F (36.4 C)   Ht 5\' 2"  (1.575 m)   Wt 120 lb (54.4 kg)   SpO2 99%   BMI 21.95 kg/m   BP Readings from Last 3 Encounters:  01/03/21 118/63  12/13/20 101/61  10/27/20  117/81    Wt Readings from Last 3 Encounters:  01/03/21 120 lb (54.4 kg)  12/13/20 120 lb (54.4 kg)  12/11/20 120 lb 3.2 oz (54.5 kg)     Physical Exam Constitutional:      General: [name] is not in acute distress.    Appearance: Normal appearance. [name] is ill-appearing. [name] is not toxic-appearing.  Cardiovascular:     Rate and Rhythm: Normal rate and regular rhythm.  Pulmonary:     Effort: Pulmonary effort is normal.     Breath sounds: Normal breath sounds.  Musculoskeletal:        General: Tenderness (left anterior tibia.) present. Normal range of motion.  Skin:    General: Skin is warm and dry.     Findings: Lesion (6 mm erythhema at right flank (location of the tick bite)) present. No erythema or rash.  Neurological:     General: No focal deficit present.     Mental Status: [name] is alert and oriented to person, place, and time.    XR L tib/fib: no fx, or other abn. Noted.  Preliminary reading done by Randell Loop   Assessment & Plan:   [name] was seen today for leg pain.  Diagnoses and all orders for this visit:  Pain in left lower leg -  DG Tibia/Fibula Left; Future -     US Venous Img Lower Bilateral (DVT); Future -     US Venous Img Lower Unilateral Left; Future  Edema of left lower leg -     US Venous Img Lower Bilateral (DVT); Future -     US Venous Img Lower Unilateral Left; Future      I am having [name]. [name] maintain her acetaminophen, primidone, fluticasone, simvastatin, montelukast, ondansetron, levothyroxine, scopolamine, amoxicillin-clavulanate, and ciprofloxacin-dexamethasone.  Allergies as of 01/03/2021       Reactions   Barium-containing Compounds    hives   Percocet [oxycodone-acetaminophen] Other (See Comments)   migraine         Medication List        Accurate as of January 03, 2021 10:48 AM. If you have any questions, ask your nurse or doctor.          acetaminophen 500 MG tablet Commonly known as: TYLENOL Take 500 mg  by mouth daily as needed for moderate pain.   amoxicillin-clavulanate 875-125 MG tablet Commonly known as: Augmentin Take 1 tablet by mouth 2 (two) times daily.   ciprofloxacin-dexamethasone OTIC suspension Commonly known as: Ciprodex Place 4 drops into the right ear 2 (two) times daily. For 7 days   fluticasone 50 MCG/ACT nasal spray Commonly known as: FLONASE Place 1 spray into both nostrils 2 (two) times daily.   levothyroxine 25 MCG tablet Commonly known as: SYNTHROID Take 1 tablet (25 mcg total) by mouth daily.   montelukast 10 MG tablet Commonly known as: SINGULAIR TAKE ONE TABLET AT BEDTIME   ondansetron 4 MG tablet Commonly known as: Zofran Take 1 tablet (4 mg total) by mouth every 8 (eight) hours as needed for nausea or vomiting.   primidone 50 MG tablet Commonly known as: MYSOLINE Take 1 tablet (50 mg total) by mouth 2 (two) times daily.   scopolamine 1 MG/3DAYS Commonly known as: Transderm-Scop (1.5 MG) Place 1 patch (1.5 mg total) onto the skin every 3 (three) days.   simvastatin 20 MG tablet Commonly known as: ZOCOR TAKE 1 TABLET ONCE DAILY AT 6PM         Follow-up: Return if symptoms worsen or fail to improve.  Claretta Fraise, M.D.

## 2021-01-03 NOTE — Telephone Encounter (Signed)
Pt called - aware result is back - but provider has not noted at this time - Pt states that she needs antibiotic asap  Will forward to Stacks and Teams message as well.  Uses [employer]

## 2021-01-03 NOTE — Telephone Encounter (Signed)
Please let the patient know that I sent their prescription to their pharmacy. Thanks, WS 

## 2021-03-08 ENCOUNTER — Ambulatory Visit: Admitting: Family Medicine

## 2021-03-08 ENCOUNTER — Encounter: Payer: Self-pay | Admitting: Family Medicine

## 2021-03-08 ENCOUNTER — Other Ambulatory Visit: Payer: Self-pay

## 2021-03-08 VITALS — BP 115/74 | HR 86 | Temp 97.7°F | Ht 62.0 in | Wt 120.4 lb

## 2021-03-08 DIAGNOSIS — K219 Gastro-esophageal reflux disease without esophagitis: Secondary | ICD-10-CM

## 2021-03-08 DIAGNOSIS — H60501 Unspecified acute noninfective otitis externa, right ear: Secondary | ICD-10-CM

## 2021-03-08 DIAGNOSIS — F419 Anxiety disorder, unspecified: Secondary | ICD-10-CM

## 2021-03-08 MED ORDER — OMEPRAZOLE 20 MG PO CPDR: 20.0000 mg | DELAYED_RELEASE_CAPSULE | Freq: Every day | ORAL | 3 refills | Status: DC

## 2021-03-08 MED ORDER — CIPROFLOXACIN-DEXAMETHASONE 0.3-0.1 % OT SUSP: 4.0000 [drp] | Freq: Two times a day (BID) | OTIC | 0 refills | Status: AC

## 2021-03-08 MED ORDER — HYDROXYZINE HCL 10 MG PO TABS: 10.0000 mg | ORAL_TABLET | Freq: Three times a day (TID) | ORAL | 0 refills | Status: DC | PRN

## 2021-03-08 NOTE — Patient Instructions (Signed)
Earache, Adult An earache, or ear pain, can be caused by many things, including: An infection. Ear wax buildup. Ear pressure. Something in the ear that should not be there (foreign body). A sore throat. Tooth problems. Jaw problems. Treatment of the earache will depend on the cause. If the cause is not clear or cannot be determined, you may need to watch your symptoms until your earache goes away or until a cause is found. Follow these instructions at home: Medicines Take or apply over-the-counter and prescription medicines only as told by your health care provider. If you were prescribed an antibiotic medicine, use it as told by your health care provider. Do not stop using the antibiotic even if you start to feel better. Do not put anything in your ear other than medicine that is prescribed by your health care provider. Managing pain If directed, apply heat to the affected area as often as told by your health care provider. Use the heat source that your health care provider recommends, such as a moist heat pack or a heating pad. Place a towel between your skin and the heat source. Leave the heat on for 20-30 minutes. Remove the heat if your skin turns bright red. This is especially important if you are unable to feel pain, heat, or cold. You may have a greater risk of getting burned. If directed, put ice on the affected area as often as told by your health care provider. To do this:   Put ice in a plastic bag. Place a towel between your skin and the bag. Leave the ice on for 20 minutes, 2-3 times a day. General instructions Pay attention to any changes in your symptoms. Try resting in an upright position instead of lying down. This may help to reduce pressure in your ear and relieve pain. Chew gum if it helps to relieve your ear pain. Treat any allergies as told by your health care provider. Drink enough fluid to keep your urine pale yellow. It is up to you to get the results of any  tests that were done. Ask your health care provider, or the department that is doing the tests, when your results will be ready. Keep all follow-up visits as told by your health care provider. This is important. Contact a health care provider if: Your pain does not improve within 2 days. Your earache gets worse. You have new symptoms. You have a fever. Get help right away if you: Have a severe headache. Have a stiff neck. Have trouble swallowing. Have redness or swelling behind your ear. Have fluid or blood coming from your ear. Have hearing loss. Feel dizzy. Summary An earache, or ear pain, can be caused by many things. Treatment of the earache will depend on the cause. Follow recommendations from your health care provider to treat your ear pain. If the cause is not clear or cannot be determined, you may need to watch your symptoms until your earache goes away or until a cause is found. Keep all follow-up visits as told by your health care provider. This is important. This information is not intended to replace advice given to you by your health care provider. Make sure you discuss any questions you have with your health care provider. Document Revised: 01/09/2019 Document Reviewed: 01/09/2019 Elsevier Patient Education  2022 [employer]vier Inc[employer]  

## 2021-03-08 NOTE — Progress Notes (Signed)
Acute Office Visit  Subjective:    Patient ID: [name], female    DOB: Nov 27, 1955, 65 y.o.   MRN: [id number]  Chief Complaint  Patient presents with   Ear Pain    HPI Patient is in today for right ear pain x 10 days. She reports that there is swelling behind her ear. She denies fever, drainage, cough, or congestion. Denies drainage or changes in hearing. She has a history of recurrent ear infections in this right ear.   She reports constant nausea for the last few months. She denies vomiting, bloating, diarrhea, or constipation. She sometimes has some intermittent lower abdominal pain. She often feels like there is something in her throat and has some pain in her lower center chest. She has tried mylanta with some relief.   Her mother in law recently passed away. She was very close to her. The funeral is tomorrow. [name] would like something to take for her anxiety to get her through the next few days.    Past Medical History:  Diagnosis Date   Cancer ([place])    cervical 2003   Vertigo     Past Surgical History:  Procedure Laterality Date   ABDOMINAL HYSTERECTOMY     CHOLECYSTECTOMY      Family History  Problem Relation Age of Onset   Heart disease Father    Diabetes Father    Parkinson's disease Neg Hx     Social History   Socioeconomic History   Marital status: Married    Spouse name: Not on file   Number of children: 1   Years of education: Not on file   Highest education level: Not on file  Occupational History   Occupation: retired    Comment: Scientist, water quality garden department  Tobacco Use   Smoking status: Some Days    Packs/day: 0.15    Types: Cigarettes   Smokeless tobacco: Never  Vaping Use   Vaping Use: Never used  Substance and Sexual Activity   Alcohol use: Yes   Drug use: No   Sexual activity: Not on file  Other Topics Concern   Not on file  Social History Narrative   Patient is a retired Scientist, 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 48 g 11   levothyroxine (SYNTHROID) 25 MCG tablet Take 1 tablet (25 mcg total) by mouth daily. 90 tablet 3   montelukast (SINGULAIR) 10 MG tablet Take 10 mg by mouth at bedtime.     primidone (MYSOLINE) 50 MG tablet Take 1 tablet (50 mg total) by mouth 2 (two) times daily. 180 tablet 3   scopolamine (TRANSDERM-SCOP, 1.5 MG,) 1 MG/3DAYS Place 1 patch (1.5 mg total) onto the skin every 3 (three) days. 10 patch 12   simvastatin (ZOCOR) 20 MG tablet TAKE 1 TABLET ONCE DAILY AT 6PM 90 tablet 0   ondansetron (ZOFRAN) 4 MG tablet Take 1 tablet (4  mg total) by mouth every 8 (eight) hours as needed for nausea or vomiting. (Patient not taking: Reported on 03/08/2021) 20 tablet 0   acetaminophen (TYLENOL) 500 MG tablet Take 500 mg by mouth daily as needed for moderate pain.     ciprofloxacin (CIPRO) 500 MG tablet Take 1 tablet (500 mg total) by mouth 2 (two) times daily. 20 tablet 0   montelukast (SINGULAIR) 10 MG tablet TAKE ONE TABLET AT BEDTIME 90 tablet 1   No facility-administered medications prior to visit.    Allergies  Allergen Reactions   Barium-Containing Compounds     hives   Percocet [Oxycodone-Acetaminophen] Other (See Comments)    migraine     Review of Systems As per HPI.     Objective:    Physical Exam Vitals and nursing note reviewed.  Constitutional:      General: She is not in acute distress.    Appearance: She is not ill-appearing,  toxic-appearing or diaphoretic.  HENT:     Right Ear: Tympanic membrane and external ear normal. Drainage (yellow, canal) and tenderness (canal) present. There is no impacted cerumen. No foreign body. No mastoid tenderness. Tympanic membrane is not injected, perforated, erythematous, retracted or bulging.     Left Ear: Tympanic membrane, ear canal and external ear normal.  Cardiovascular:     Rate and Rhythm: Normal rate and regular rhythm.     Heart sounds: Normal heart sounds. No murmur heard. Pulmonary:     Effort: Pulmonary effort is normal. No respiratory distress.     Breath sounds: Normal breath sounds.  Abdominal:     General: Bowel sounds are normal. There is no distension.     Palpations: Abdomen is soft.     Tenderness: There is no abdominal tenderness. There is no right CVA tenderness, left CVA tenderness, guarding or rebound.  Neurological:     Mental Status: She is alert.    BP 115/74   Pulse 86   Temp 97.7 F (36.5 C) (Temporal)   Ht _0  (1.575 m)   Wt 120 lb 6 oz (54.6 kg)   BMI 22.02 kg/m  Wt Readings from Last 3 Encounters:  03/08/21 120 lb 6 oz (54.6 kg)  01/03/21 120 lb (54.4 kg)  12/13/20 120 lb (54.4 kg)    Health Maintenance Due  Topic Date Due   COLONOSCOPY (Pts 45-76yr Insurance coverage will need to be confirmed)  Never done   MAMMOGRAM  Never done   PAP SMEAR-Modifier  05/06/2021    There are no preventive care reminders to display for this patient.   Lab Results  Component Value Date   TSH 1.390 10/27/2020   Lab Results  Component Value Date   WBC 5.2 12/11/2020   HGB 11.6 12/11/2020   HCT 34.1 12/11/2020   MCV 94 12/11/2020   PLT 185 12/11/2020   Lab Results  Component Value Date   NA 143 12/11/2020   K 4.9 12/11/2020   CO2 23 12/11/2020   GLUCOSE 82 12/11/2020   BUN 14 12/11/2020   CREATININE 1.10 (H) 12/11/2020   BILITOT <0.2 08/30/2020   ALKPHOS 94 08/30/2020   AST 19 08/30/2020   ALT 18 08/30/2020   PROT 5.6 (L)  08/30/2020   ALBUMIN 4.6 10/27/2020   CALCIUM 9.4 12/11/2020   ANIONGAP 10 06/19/2020   EGFR 56 (L) 12/11/2020   Lab Results  Component Value Date   CHOL 135 03/02/2020   Lab Results  Component Value Date   HDL 57  03/02/2020   Lab Results  Component Value Date   LDLCALC 60 03/02/2020   Lab Results  Component Value Date   TRIG 94 03/02/2020   Lab Results  Component Value Date   CHOLHDL 2.4 03/02/2020   Lab Results  Component Value Date   HGBA1C 4.7 12/11/2020       Assessment & Plan:   Lariyah was seen today for ear pain.  Diagnoses and all orders for this visit:  Acute otitis externa of right ear, unspecified type Ciprodex as below. It appears that patient frequently had complaints of this right eye. Offered ENT referral today, but she declined.  -     ciprofloxacin-dexamethasone (CIPRODEX) OTIC suspension; Place 4 drops into the right ear 2 (two) times daily for 7 days.  Gastroesophageal reflux disease, unspecified whether esophagitis present Not controlled. Try prilosec as below.  -     omeprazole (PRILOSEC) 20 MG capsule; Take 1 capsule (20 mg total) by mouth daily.  Anxiety Related to recent death of mother in law. Hydroxyzine prn.  -     hydrOXYzine (ATARAX/VISTARIL) 10 MG tablet; Take 1 tablet (10 mg total) by mouth 3 (three) times daily as needed for anxiety.  Return if symptoms worsen or fail to improve.  The patient indicates understanding of these issues and agrees with the plan.   Gwenlyn Perking, FNP

## 2021-04-10 DIAGNOSIS — R2689 Other abnormalities of gait and mobility: Secondary | ICD-10-CM | POA: Insufficient documentation

## 2021-04-10 DIAGNOSIS — H903 Sensorineural hearing loss, bilateral: Secondary | ICD-10-CM | POA: Insufficient documentation

## 2021-04-10 DIAGNOSIS — H9311 Tinnitus, right ear: Secondary | ICD-10-CM | POA: Insufficient documentation

## 2021-04-11 ENCOUNTER — Other Ambulatory Visit: Payer: Self-pay | Admitting: Physician Assistant

## 2021-04-12 ENCOUNTER — Other Ambulatory Visit: Payer: Self-pay | Admitting: Physician Assistant

## 2021-04-12 DIAGNOSIS — H903 Sensorineural hearing loss, bilateral: Secondary | ICD-10-CM

## 2021-04-12 DIAGNOSIS — H9311 Tinnitus, right ear: Secondary | ICD-10-CM

## 2021-04-16 ENCOUNTER — Other Ambulatory Visit: Payer: Self-pay

## 2021-04-16 ENCOUNTER — Ambulatory Visit (INDEPENDENT_AMBULATORY_CARE_PROVIDER_SITE_OTHER): Payer: Medicare Other | Admitting: Nurse Practitioner

## 2021-04-16 ENCOUNTER — Encounter: Payer: Self-pay | Admitting: Nurse Practitioner

## 2021-04-16 VITALS — BP 136/84 | HR 76 | Temp 97.4°F | Resp 20 | Ht 62.0 in | Wt 118.0 lb

## 2021-04-16 DIAGNOSIS — R300 Dysuria: Secondary | ICD-10-CM | POA: Diagnosis not present

## 2021-04-16 DIAGNOSIS — N898 Other specified noninflammatory disorders of vagina: Secondary | ICD-10-CM | POA: Diagnosis not present

## 2021-04-16 LAB — MICROSCOPIC EXAMINATION
RBC, Urine: >30 /hpf — ABNORMAL HIGH (ref 0–2)
WBC, UA: >30 /hpf — ABNORMAL HIGH (ref 0–5)

## 2021-04-16 LAB — URINALYSIS, COMPLETE
Bilirubin, UA: NEGATIVE
Glucose, UA: NEGATIVE
Ketones, UA: NEGATIVE
Nitrite, UA: NEGATIVE
Specific Gravity, UA: 1.025 (ref 1.005–1.030)
Urobilinogen, Ur: 0.2 mg/dL (ref 0.2–1.0)
pH, UA: 6.5 (ref 5.0–7.5)

## 2021-04-16 LAB — WET PREP FOR TRICH, YEAST, CLUE
Clue Cell Exam: POSITIVE — AB
Trichomonas Exam: NEGATIVE
Yeast Exam: NEGATIVE

## 2021-04-16 MED ORDER — METRONIDAZOLE 500 MG PO TABS: 500.0000 mg | ORAL_TABLET | Freq: Two times a day (BID) | ORAL | 0 refills | Status: DC

## 2021-04-16 NOTE — Assessment & Plan Note (Signed)
Unresolved dysuria in the past week with symptoms of vaginal burning and copious amount of discharge.  Completed urinalysis which is positive for leukocytes and blood.  Completed urine cultures results pending.  Advised to increase hydration, and take  medication as prescribed.

## 2021-04-16 NOTE — Assessment & Plan Note (Signed)
Vaginal charge unresolved.  Over-the-counter medication with no therapeutic effect.  Completed wet prep which is positive for clue cells, moderate bacteria and moderate epithelial cells.  Education provided to patient with printed handouts given.  Treating patient with metronidazole 500 mg tablet for 7 days.  Patient knows to follow-up with worsening unresolved symptoms.

## 2021-04-16 NOTE — Patient Instructions (Signed)
Dysuria ?Dysuria is pain or discomfort during urination. The pain or discomfort may be felt in the part of the body that drains urine from the bladder (urethra) or in the surrounding tissue of the genitals. The pain may also be felt in the groin area, lower abdomen, or lower back. ?You may have to urinate frequently or have the sudden feeling that you have to urinate (urgency). Dysuria can affect anyone, but it is more common in females. Dysuria can be caused by many different things, including: ?Urinary tract infection. ?Kidney stones or bladder stones. ?Certain STIs (sexually transmitted infections), such as chlamydia. ?Dehydration. ?Inflammation of the tissues of the vagina. ?Use of certain medicines. ?Use of certain soaps or scented products that cause irritation. ?Follow these instructions at home: ?Medicines ?Take over-the-counter and prescription medicines only as told by your health care provider. ?If you were prescribed an antibiotic medicine, take it as told by your health care provider. Do not stop taking the antibiotic even if you start to feel better. ?Eating and drinking ? ?Drink enough fluid to keep your urine pale yellow. ?Avoid caffeinated beverages, tea, and alcohol. These beverages can irritate the bladder and make dysuria worse. In males, alcohol may irritate the prostate. ?General instructions ?Watch your condition for any changes. ?Urinate often. Avoid holding urine for long periods of time. ?If you are female, you should wipe from front to back after urinating or having a bowel movement. Use each piece of toilet paper only once. ?Empty your bladder after sex. ?Keep all follow-up visits. This is important. ?If you had any tests done to find the cause of dysuria, it is up to you to get your test results. Ask your health care provider, or the department that is doing the test, when your results will be ready. ?Contact a health care provider if: ?You have a fever. ?You develop pain in your back or  sides. ?You have nausea or vomiting. ?You have blood in your urine. ?You are not urinating as often as you usually do. ?Get help right away if: ?Your pain is severe and not relieved with medicines. ?You cannot eat or drink without vomiting. ?You are confused. ?You have a rapid heartbeat while resting. ?You have shaking or chills. ?You feel extremely weak. ?Summary ?Dysuria is pain or discomfort while urinating. Many different conditions can lead to dysuria. ?If you have dysuria, you may have to urinate frequently or have the sudden feeling that you have to urinate (urgency). ?Watch your condition for any changes. Keep all follow-up visits. ?Make sure that you urinate often and drink enough fluid to keep your urine pale yellow. ?This information is not intended to replace advice given to you by your health care provider. Make sure you discuss any questions you have with your health care provider. ?Document Revised: 01/14/2020 Document Reviewed: 01/14/2020 ?Elsevier Patient Education ? 2022 Elsevier Inc[employer] ? ?

## 2021-04-16 NOTE — Progress Notes (Signed)
Acute Office Visit  Subjective:    Patient ID: [name], female    DOB: August 04, 1955, 65 y.o.   MRN: [id number]  Chief Complaint  Patient presents with   Vaginal irritation   Dysuria    Dysuria  This is a recurrent problem. The problem has been gradually worsening. The quality of the pain is described as burning. The pain is moderate. There has been no fever. She is Not sexually active. Associated symptoms include frequency and urgency. Pertinent negatives include no chills, flank pain, hematuria or nausea. Treatments tried: Vaginal cream OTC. The treatment provided no relief.  Vaginal Discharge The patient's primary symptoms include vaginal discharge. The patient's pertinent negatives include no genital itching. This is a new problem. The problem has been gradually worsening. The problem affects both sides. Associated symptoms include dysuria, frequency and urgency. Pertinent negatives include no chills, flank pain, hematuria, nausea or rash.    Past Medical History:  Diagnosis Date   Cancer ([place])    cervical 2003   Vertigo     Past Surgical History:  Procedure Laterality Date   ABDOMINAL HYSTERECTOMY     CHOLECYSTECTOMY      Family History  Problem Relation Age of Onset   Heart disease Father    Diabetes Father    Parkinson's disease Neg Hx     Social History   Socioeconomic History   Marital status: Married    Spouse name: Not on file   Number of children: 1   Years of education: Not on file   Highest education level: Not on file  Occupational History   Occupation: retired    Comment: Scientist, water quality garden department  Tobacco Use   Smoking status: Some Days    Packs/day: 0.15    Types: Cigarettes   Smokeless tobacco: Never  Vaping Use   Vaping Use: Never used  Substance and Sexual Activity   Alcohol use: Yes   Drug use: No   Sexual activity: Not on file  Other Topics Concern   Not on file  Social History Narrative   Patient is a retired Scientist, 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 90 tablet 3   primidone (MYSOLINE) 50 MG tablet Take 1 tablet (50 mg total) by mouth 2 (two) times daily. 180 tablet 3   simvastatin (ZOCOR) 20 MG tablet TAKE 1 TABLET ONCE DAILY AT 6PM 90 tablet 0   fluticasone (FLONASE) 50 MCG/ACT nasal spray Place 1 spray into both nostrils 2 (two) times daily. (Patient not taking: Reported on 04/16/2021) 48 g 11   hydrOXYzine (ATARAX/VISTARIL) 10 MG tablet Take 1 tablet (10 mg total) by mouth 3 (three) times daily as needed for anxiety. (Patient not taking: Reported on 04/16/2021) 30 tablet 0   montelukast (SINGULAIR) 10 MG tablet Take 10 mg by mouth at bedtime. (Patient not taking: Reported on 04/16/2021)     omeprazole (PRILOSEC) 20 MG capsule Take  1 capsule (20 mg total) by mouth daily. (Patient not taking: Reported on 04/16/2021) 30 capsule 3   ondansetron (ZOFRAN) 4 MG tablet Take 1 tablet (4 mg total) by mouth every 8 (eight) hours as needed for nausea or vomiting. (Patient not taking: No sig reported) 20 tablet 0   scopolamine (TRANSDERM-SCOP, 1.5 MG,) 1 MG/3DAYS Place 1 patch (1.5 mg total) onto the skin every 3 (three) days. (Patient not taking: Reported on 04/16/2021) 10 patch 12   No facility-administered medications prior to visit.    Allergies  Allergen Reactions   Barium-Containing Compounds     hives   Percocet [Oxycodone-Acetaminophen] Other (See Comments)    migraine     Review of  Systems  Constitutional:  Negative for chills.  Gastrointestinal:  Negative for nausea.  Genitourinary:  Positive for dysuria, frequency, urgency and vaginal discharge. Negative for flank pain and hematuria.  Skin:  Negative for rash.  All other systems reviewed and are negative.     Objective:    Physical Exam Vitals and nursing note reviewed.  Constitutional:      Appearance: Normal appearance. She is normal weight.  HENT:     Head: Normocephalic.     Mouth/Throat:     Mouth: Mucous membranes are moist.     Pharynx: Oropharynx is clear.  Eyes:     Conjunctiva/sclera: Conjunctivae normal.  Cardiovascular:     Rate and Rhythm: Normal rate.     Pulses: Normal pulses.     Heart sounds: Normal heart sounds.  Pulmonary:     Effort: Pulmonary effort is normal.     Breath sounds: Normal breath sounds.  Abdominal:     General: Bowel sounds are normal.     Tenderness: There is no abdominal tenderness. There is no right CVA tenderness or left CVA tenderness.  Musculoskeletal:        General: Normal range of motion.  Skin:    Findings: No rash.  Neurological:     Mental Status: She is alert and oriented to person, place, and time.    BP 136/84   Pulse 76   Temp (!) 97.4 F (36.3 C) (Temporal)   Resp 20   Ht '5\' 2"'  (1.575 m)   Wt 118 lb (53.5 kg)   SpO2 99%   BMI 21.58 kg/m  Wt Readings from Last 3 Encounters:  04/16/21 118 lb (53.5 kg)  03/08/21 120 lb 6 oz (54.6 kg)  01/03/21 120 lb (54.4 kg)    Health Maintenance Due  Topic Date Due   COLONOSCOPY (Pts 45-74yr Insurance coverage will need to be confirmed)  Never done   MAMMOGRAM  Never done   Pneumonia Vaccine 65 Years old (2 - PPSV23 if available, else PCV20) 01/08/2017   DEXA SCAN  Never done   PAP SMEAR-Modifier  05/06/2021    There are no preventive care reminders to display for this patient.   Lab Results  Component Value Date   TSH 1.390 10/27/2020   Lab Results  Component Value Date   WBC 5.2  12/11/2020   HGB 11.6 12/11/2020   HCT 34.1 12/11/2020   MCV 94 12/11/2020   PLT 185 12/11/2020   Lab Results  Component Value Date   NA 143 12/11/2020   K 4.9 12/11/2020   CO2 23 12/11/2020   GLUCOSE 82 12/11/2020   BUN 14 12/11/2020   CREATININE 1.10 (H) 12/11/2020   BILITOT <0.2 08/30/2020   ALKPHOS 94 08/30/2020   AST 19 08/30/2020  ALT 18 08/30/2020   PROT 5.6 (L) 08/30/2020   ALBUMIN 4.6 10/27/2020   CALCIUM 9.4 12/11/2020   ANIONGAP 10 06/19/2020   EGFR 56 (L) 12/11/2020   Lab Results  Component Value Date   CHOL 135 03/02/2020   Lab Results  Component Value Date   HDL 57 03/02/2020   Lab Results  Component Value Date   LDLCALC 60 03/02/2020   Lab Results  Component Value Date   TRIG 94 03/02/2020   Lab Results  Component Value Date   CHOLHDL 2.4 03/02/2020   Lab Results  Component Value Date   HGBA1C 4.7 12/11/2020       Assessment & Plan:   Problem List Items Addressed This Visit       Other   Dysuria    Unresolved dysuria in the past week with symptoms of vaginal burning and copious amount of discharge.  Completed urinalysis which is positive for leukocytes and blood.  Completed urine cultures results pending.  Advised to increase hydration, and take  medication as prescribed.      Relevant Orders   Urinalysis, Complete   Urine Culture   Vaginal irritation - Primary    Vaginal charge unresolved.  Over-the-counter medication with no therapeutic effect.  Completed wet prep which is positive for clue cells, moderate bacteria and moderate epithelial cells.  Education provided to patient with printed handouts given.  Treating patient with metronidazole 500 mg tablet for 7 days.  Patient knows to follow-up with worsening unresolved symptoms.      Relevant Medications   metroNIDAZOLE (FLAGYL) 500 MG tablet   Other Relevant Orders   WET PREP FOR TRICH, YEAST, CLUE     Meds ordered this encounter  Medications   metroNIDAZOLE (FLAGYL) 500  MG tablet    Sig: Take 1 tablet (500 mg total) by mouth 2 (two) times daily.    Dispense:  14 tablet    Refill:  0    Order Specific Question:   Supervising Provider    Answer:   Claretta Fraise [[id number]]     Ivy Lynn, NP

## 2021-04-17 ENCOUNTER — Telehealth: Payer: Self-pay | Admitting: Nurse Practitioner

## 2021-04-17 ENCOUNTER — Other Ambulatory Visit: Payer: Self-pay | Admitting: Nurse Practitioner

## 2021-04-17 MED ORDER — NITROFURANTOIN MONOHYD MACRO 100 MG PO CAPS: 100.0000 mg | ORAL_CAPSULE | Freq: Two times a day (BID) | ORAL | 0 refills | Status: DC

## 2021-04-17 NOTE — Telephone Encounter (Signed)
Pt aware.

## 2021-04-18 ENCOUNTER — Telehealth: Payer: Self-pay | Admitting: Family Medicine

## 2021-04-18 NOTE — Telephone Encounter (Signed)
Lmtcb across 11/02

## 2021-04-20 ENCOUNTER — Telehealth: Payer: Self-pay | Admitting: Family Medicine

## 2021-04-20 LAB — URINE CULTURE

## 2021-04-25 NOTE — Telephone Encounter (Signed)
faxed

## 2021-05-09 ENCOUNTER — Telehealth: Payer: Self-pay | Admitting: Family Medicine

## 2021-05-09 ENCOUNTER — Ambulatory Visit: Payer: Medicare Other | Admitting: Family Medicine

## 2021-05-09 ENCOUNTER — Other Ambulatory Visit: Payer: Self-pay

## 2021-05-09 ENCOUNTER — Other Ambulatory Visit (HOSPITAL_COMMUNITY)
Admission: RE | Admit: 2021-05-09 | Discharge: 2021-05-09 | Disposition: A | Discharge status: Home / Self Care | Payer: Medicare Other | Source: Ambulatory Visit | Attending: Family Medicine | Admitting: Family Medicine

## 2021-05-09 ENCOUNTER — Encounter: Payer: Self-pay | Admitting: Family Medicine

## 2021-05-09 ENCOUNTER — Ambulatory Visit (INDEPENDENT_AMBULATORY_CARE_PROVIDER_SITE_OTHER): Payer: Medicare Other | Admitting: Family Medicine

## 2021-05-09 VITALS — BP 141/76 | HR 61 | Temp 97.6°F | Ht 62.0 in | Wt 116.6 lb

## 2021-05-09 DIAGNOSIS — R300 Dysuria: Secondary | ICD-10-CM

## 2021-05-09 DIAGNOSIS — R8290 Unspecified abnormal findings in urine: Secondary | ICD-10-CM

## 2021-05-09 DIAGNOSIS — N898 Other specified noninflammatory disorders of vagina: Secondary | ICD-10-CM

## 2021-05-09 DIAGNOSIS — R32 Unspecified urinary incontinence: Secondary | ICD-10-CM | POA: Diagnosis not present

## 2021-05-09 LAB — URINALYSIS, ROUTINE W REFLEX MICROSCOPIC
Bilirubin, UA: NEGATIVE
Glucose, UA: NEGATIVE
Ketones, UA: NEGATIVE
Nitrite, UA: NEGATIVE
Protein,UA: NEGATIVE
Specific Gravity, UA: 1.01 (ref 1.005–1.030)
Urobilinogen, Ur: 0.2 mg/dL (ref 0.2–1.0)
pH, UA: 6.5 (ref 5.0–7.5)

## 2021-05-09 LAB — WET PREP FOR TRICH, YEAST, CLUE
Clue Cell Exam: NEGATIVE
Trichomonas Exam: NEGATIVE
Yeast Exam: NEGATIVE

## 2021-05-09 LAB — MICROSCOPIC EXAMINATION
Renal Epithel, UA: NONE SEEN /hpf
WBC, UA: >30 /hpf — AB (ref 0–5)

## 2021-05-09 MED ORDER — MIRABEGRON ER 25 MG PO TB24: 25.0000 mg | ORAL_TABLET | Freq: Every day | ORAL | 2 refills | Status: DC

## 2021-05-09 NOTE — Telephone Encounter (Signed)
[facility] states they already transferred rx to [facility] and she is aware

## 2021-05-09 NOTE — Progress Notes (Signed)
Assessment & Plan:  1. Vagina itching Discussed itching is not due to a yeast infection as patient suspected. Urine sent to rule out gonorrhea/chlamydia. Encouraged her to apply a barrier cream as itching may be due to irritation from urine due to urinary incontinence.  - WET PREP FOR [place], YEAST, CLUE - negative for clue cells, trichomonas, and yeast; positive for moderate bacteria.  - Urine cytology ancillary only  2. Dysuria Not felt to be related to a UTI since her urine culture was negative 3 weeks ago when she had similar symptoms. Urine sent to rule out gonorrhea/chlamydia.  - Urinalysis, Routine w reflex microscopic  3. Abnormal urinalysis - Urine Culture  4. Urinary incontinence, unspecified type Started Myrbetriq. Attempted to discuss pelvic health physical therapy, but patient was not interested. Education provided on urinary incontinence.  - mirabegron ER (MYRBETRIQ) 25 MG TB24 tablet; Take 1 tablet (25 mg total) by mouth daily.  Dispense: 30 tablet; Refill: 2   Follow up plan: Return ASAP with PCP, for follow-up of chronic medication conditions.  Hendricks Limes, MSN, APRN, FNP-C [facility]  Subjective:   Patient ID: [name], female    DOB: July 26, 1955, 65 y.o.   MRN: [id number]  HPI: [name] is a 65 y.o. female presenting on 05/09/2021 for Dysuria (X 2 days) and Vaginal Itching (X 2 days)  Patient complains of dysuria and vaginal itching  She has had symptoms for 2 days. She had similar symptoms three weeks ago at which time she was treated for a UTI with Macrobid. She reports her symptoms did resolve, but returned. Her urine dipstick at that time showed positive for RBC's, positive for protein, and positive for leukocytes, but her urine culture was negative. She also had a wet prep at that time that was positive for bacterial vaginosis; treated with metronidazole.   Patient does have urinary incontinence which she reports she spoke  to her urologist about and was told it was all in her head. She wears pads due to leakage.    ROS: Negative unless specifically indicated above in HPI.   Relevant past medical history reviewed and updated as indicated.   Allergies and medications reviewed and updated.   Current Outpatient Medications:    levothyroxine (SYNTHROID) 25 MCG tablet, Take 1 tablet (25 mcg total) by mouth daily., Disp: 90 tablet, Rfl: 3   primidone (MYSOLINE) 50 MG tablet, Take 1 tablet (50 mg total) by mouth 2 (two) times daily., Disp: 180 tablet, Rfl: 3   simvastatin (ZOCOR) 20 MG tablet, TAKE 1 TABLET ONCE DAILY AT 6PM, Disp: 90 tablet, Rfl: 0   fluticasone (FLONASE) 50 MCG/ACT nasal spray, Place 1 spray into both nostrils 2 (two) times daily. (Patient not taking: Reported on 04/16/2021), Disp: 48 g, Rfl: 11   hydrOXYzine (ATARAX/VISTARIL) 10 MG tablet, Take 1 tablet (10 mg total) by mouth 3 (three) times daily as needed for anxiety. (Patient not taking: Reported on 04/16/2021), Disp: 30 tablet, Rfl: 0   montelukast (SINGULAIR) 10 MG tablet, Take 10 mg by mouth at bedtime. (Patient not taking: Reported on 04/16/2021), Disp: , Rfl:    omeprazole (PRILOSEC) 20 MG capsule, Take 1 capsule (20 mg total) by mouth daily. (Patient not taking: Reported on 04/16/2021), Disp: 30 capsule, Rfl: 3   scopolamine (TRANSDERM-SCOP, 1.5 MG,) 1 MG/3DAYS, Place 1 patch (1.5 mg total) onto the skin every 3 (three) days. (Patient not taking: Reported on 04/16/2021), Disp: 10 patch, Rfl: 12  Allergies  Allergen Reactions   Barium-Containing Compounds     hives   Percocet [Oxycodone-Acetaminophen] Other (See Comments)    migraine     Objective:   BP (!) 141/76   Pulse 61   Temp 97.6 F (36.4 C) (Temporal)   Ht 5\' 2"  (1.575 m)   Wt 116 lb 9.6 oz (52.9 kg)   BMI 21.33 kg/m    Physical Exam Vitals reviewed.  Constitutional:      General: She is not in acute distress.    Appearance: Normal appearance. She is not  ill-appearing, toxic-appearing or diaphoretic.  HENT:     Head: Normocephalic and atraumatic.  Eyes:     General: No scleral icterus.       Right eye: No discharge.        Left eye: No discharge.     Conjunctiva/sclera: Conjunctivae normal.  Cardiovascular:     Rate and Rhythm: Normal rate.  Pulmonary:     Effort: Pulmonary effort is normal. No respiratory distress.  Musculoskeletal:        General: Normal range of motion.     Cervical back: Normal range of motion.  Skin:    General: Skin is warm and dry.     Capillary Refill: Capillary refill takes less than 2 seconds.  Neurological:     General: No focal deficit present.     Mental Status: She is alert and oriented to person, place, and time. Mental status is at baseline.  Psychiatric:        Mood and Affect: Mood normal.        Behavior: Behavior normal.        Thought Content: Thought content normal.        Judgment: Judgment normal.

## 2021-05-11 LAB — URINE CYTOLOGY ANCILLARY ONLY
Chlamydia: NEGATIVE
Comment: NEGATIVE
Comment: NORMAL
Neisseria Gonorrhea: NEGATIVE

## 2021-05-13 LAB — URINE CULTURE

## 2021-05-16 ENCOUNTER — Telehealth: Payer: Self-pay | Admitting: Family Medicine

## 2021-05-16 NOTE — Telephone Encounter (Signed)
Called pt to schedule AWV

## 2021-05-23 ENCOUNTER — Encounter: Payer: Self-pay | Admitting: Family Medicine

## 2021-05-23 ENCOUNTER — Other Ambulatory Visit: Payer: Self-pay

## 2021-05-23 ENCOUNTER — Ambulatory Visit (INDEPENDENT_AMBULATORY_CARE_PROVIDER_SITE_OTHER): Payer: Medicare Other | Admitting: Family Medicine

## 2021-05-23 VITALS — BP 132/78 | HR 60 | Temp 98.1°F | Ht 62.0 in | Wt 117.2 lb

## 2021-05-23 DIAGNOSIS — E039 Hypothyroidism, unspecified: Secondary | ICD-10-CM | POA: Diagnosis not present

## 2021-05-23 DIAGNOSIS — F410 Panic disorder [episodic paroxysmal anxiety] without agoraphobia: Secondary | ICD-10-CM

## 2021-05-23 DIAGNOSIS — R630 Anorexia: Secondary | ICD-10-CM

## 2021-05-23 DIAGNOSIS — F418 Other specified anxiety disorders: Secondary | ICD-10-CM

## 2021-05-23 DIAGNOSIS — D692 Other nonthrombocytopenic purpura: Secondary | ICD-10-CM

## 2021-05-23 MED ORDER — MIRTAZAPINE 7.5 MG PO TABS: 7.5000 mg | ORAL_TABLET | Freq: Every day | ORAL | 0 refills | Status: DC

## 2021-05-23 NOTE — Progress Notes (Signed)
Subjective: CC: Hypothyroidism PCP: Kaitlin Norlander, DO OHY:WVPX F Steffy is a 65 y.o. female presenting to clinic today for:  1.  Hypothyroidism/anxiety Patient is compliant with Synthroid 25 mcg daily.  She reports increased anxiety, feeling of jitteriness.  Denies any change in bowel habits.  She reports poor sleep.  Symptoms seem to onset sometime in 03-17-23.  She admits that her mother-in-law passed away in 03/17/23.  She is usually able to cope with multiple stressors but finds herself more easily overwhelmed now.  She is had multiple medical issues over the last several years including this past summer.  She is tired of having to have surgeries and other interventions done.  She is currently being evaluated for an ear issue on the right by ENT but she has not proceed with a CAT scan because she worries about yet more interventions  2.  Easy bruising Patient reports easy bruising.  She made mention of this to her dermatologist and was told that it had something to do with the amount of time spent in the sun.  She is not on any anticoagulants including aspirin.  She does have history of cervical cancer that was resected many years ago.  ROS: Per HPI  Allergies  Allergen Reactions   Barium-Containing Compounds     hives   Percocet [Oxycodone-Acetaminophen] Other (See Comments)    migraine    Past Medical History:  Diagnosis Date   Cancer ([place])    cervical 2003   Vertigo     Current Outpatient Medications:    levothyroxine (SYNTHROID) 25 MCG tablet, Take 1 tablet (25 mcg total) by mouth daily., Disp: 90 tablet, Rfl: 3   primidone (MYSOLINE) 50 MG tablet, Take 1 tablet (50 mg total) by mouth 2 (two) times daily., Disp: 180 tablet, Rfl: 3   simvastatin (ZOCOR) 20 MG tablet, TAKE 1 TABLET ONCE DAILY AT 6PM, Disp: 90 tablet, Rfl: 0 Social History   Socioeconomic History   Marital status: Married    Spouse name: Not on file   Number of children: 1   Years of education:  Not on file   Highest education level: Not on file  Occupational History   Occupation: retired    Comment: Scientist, water quality garden department  Tobacco Use   Smoking status: Some Days    Packs/day: 0.15    Types: Cigarettes   Smokeless tobacco: Never  Vaping Use   Vaping Use: Never used  Substance and Sexual Activity   Alcohol use: Yes   Drug use: No   Sexual activity: Not on file  Other Topics Concern   Not on file  Social History Narrative   Patient is a retired Scientist, 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 BP 132/78   Pulse 60   Temp 98.1 F (36.7 C)   Ht '5\' 2"'  (1.575 m)   Wt 117 lb 3.2  oz (53.2 kg)   SpO2 99%   BMI 21.44 kg/m   Physical Examination:  General: Awake, alert, anxious appearing female HEENT: Sclera white.  No exophthalmos.  No goiter Cardio: regular rate and rhythm, S1S2 heard, no murmurs appreciated Pulm: clear to auscultation bilaterally, no wheezes, rhonchi or rales; normal work of breathing on room air Skin: Purpura noted through bilateral upper extremities Psych: Mood labile.  Patient intermittently tearful.  Thought process is linear Neuro: No tremor  Depression screen [facility] 2/9 05/23/2021 05/09/2021 04/16/2021  Decreased Interest 1 0 0  Down, Depressed, Hopeless 0 3 0  PHQ - 2 Score 1 3 0  Altered sleeping 1 0 0  Tired, decreased energy '2 3 1  ' Change in appetite 0 0 0  Feeling bad or failure about yourself  0 2 0  Trouble  concentrating 0 0 0  Moving slowly or fidgety/restless 1 1 0  Suicidal thoughts 0 0 0  PHQ-9 Score '5 9 1  ' Difficult doing work/chores Somewhat difficult Somewhat difficult Not difficult at all   GAD 7 : Generalized Anxiety Score 05/23/2021 05/09/2021 04/16/2021  Nervous, Anxious, on Edge '3 3 3  ' Control/stop worrying 0 0 0  Worry too much - different things 0 1 0  Trouble relaxing 0 0 0  Restless 0 0 0  Easily annoyed or irritable 1 1 0  Afraid - awful might happen 0 0 0  Total GAD 7 Score '4 5 3  ' Anxiety Difficulty Somewhat difficult Somewhat difficult Not difficult at all     Assessment/ Plan: 65 y.o. female   Acquired hypothyroidism - Plan: TSH, T4, Free, CMP14+EGFR  Panic anxiety syndrome - Plan: mirtazapine (REMERON) 7.5 MG tablet  Anxiety about health - Plan: mirtazapine (REMERON) 7.5 MG tablet  Poor appetite - Plan: mirtazapine (REMERON) 7.5 MG tablet, CMP14+EGFR  Purpura senilis (HCC) - Plan: CBC, CMP14+EGFR  She is having a lot of anxiety.  Uncertain if this is related to thyroid but will check TSH, free T4 and metabolic panel  I favor that her symptoms are related to just multiple medical issues that she is had over the last several years and recent passing of her mother-in-law.  I am going to place her on mirtazapine 7.5 mg at bedtime to help with appetite, rest and anxiety symptoms.  Would like to reconvene in about 6 weeks for recheck, sooner if concerns arise  With regards to the bruising that she has been experience on the arms, it seems consistent with a progressive illness but we will check CBC and CMP.  Not currently treated with any anticoagulants or fish oils that would promote easy bruising or bleeding.  No orders of the defined types were placed in this encounter.  No orders of the defined types were placed in this encounter.    Kaitlin Norlander, DO [place] [phone]

## 2021-05-23 NOTE — Patient Instructions (Signed)
I am checking your labs to look for any evidence of thyroid abnormality in your blood.  We may need to adjust your medication  However, I suspect that your symptoms are related to stress about your health.  I have sent in Mirtazapine 7.5mg  to start tonight.  This will help with anxiety, sleep AND stimulate appetite.  I'd like to see you again in about 6 weeks to see how it is helping and we can increase dose at that time if needed.

## 2021-05-24 ENCOUNTER — Encounter: Payer: Self-pay | Admitting: Family Medicine

## 2021-05-24 DIAGNOSIS — N183 Chronic kidney disease, stage 3 unspecified: Secondary | ICD-10-CM | POA: Insufficient documentation

## 2021-05-24 LAB — CMP14+EGFR
ALT: 25 IU/L (ref 0–32)
AST: 23 IU/L (ref 0–40)
Albumin/Globulin Ratio: 2.1 (ref 1.2–2.2)
Albumin: 4.2 g/dL (ref 3.8–4.8)
Alkaline Phosphatase: 95 IU/L (ref 44–121)
BUN/Creatinine Ratio: 16 (ref 12–28)
BUN: 18 mg/dL (ref 8–27)
Bilirubin Total: <0.2 mg/dL (ref 0.0–1.2)
CO2: 26 mmol/L (ref 20–29)
Calcium: 9.6 mg/dL (ref 8.7–10.3)
Chloride: 105 mmol/L (ref 96–106)
Creatinine, Ser: 1.11 mg/dL — ABNORMAL HIGH (ref 0.57–1.00)
Globulin, Total: 2 g/dL (ref 1.5–4.5)
Glucose: 102 mg/dL — ABNORMAL HIGH (ref 70–99)
Potassium: 5.1 mmol/L (ref 3.5–5.2)
Sodium: 140 mmol/L (ref 134–144)
Total Protein: 6.2 g/dL (ref 6.0–8.5)
eGFR: 55 mL/min/{1.73_m2} — ABNORMAL LOW (ref >59)

## 2021-05-24 LAB — CBC
Hematocrit: 41 % (ref 34.0–46.6)
Hemoglobin: 13.9 g/dL (ref 11.1–15.9)
MCH: 32.1 pg (ref 26.6–33.0)
MCHC: 33.9 g/dL (ref 31.5–35.7)
MCV: 95 fL (ref 79–97)
Platelets: 170 10*3/uL (ref 150–450)
RBC: 4.33 x10E6/uL (ref 3.77–5.28)
RDW: 12.4 % (ref 11.7–15.4)
WBC: 5 10*3/uL (ref 3.4–10.8)

## 2021-05-24 LAB — TSH: TSH: 1.64 u[IU]/mL (ref 0.450–4.500)

## 2021-05-24 LAB — T4, FREE: Free T4: 0.96 ng/dL (ref 0.82–1.77)

## 2021-06-03 ENCOUNTER — Other Ambulatory Visit: Payer: Self-pay | Admitting: Family Medicine

## 2021-06-03 DIAGNOSIS — F418 Other specified anxiety disorders: Secondary | ICD-10-CM

## 2021-06-03 DIAGNOSIS — R630 Anorexia: Secondary | ICD-10-CM

## 2021-06-03 DIAGNOSIS — F410 Panic disorder [episodic paroxysmal anxiety] without agoraphobia: Secondary | ICD-10-CM

## 2021-06-04 ENCOUNTER — Encounter: Payer: Self-pay | Admitting: Nurse Practitioner

## 2021-06-04 ENCOUNTER — Ambulatory Visit (INDEPENDENT_AMBULATORY_CARE_PROVIDER_SITE_OTHER): Payer: Medicare Other | Admitting: Nurse Practitioner

## 2021-06-04 ENCOUNTER — Telehealth: Payer: Self-pay | Admitting: Family Medicine

## 2021-06-04 VITALS — BP 125/71 | HR 65 | Temp 97.5°F | Resp 20 | Ht 62.0 in | Wt 118.0 lb

## 2021-06-04 DIAGNOSIS — R4589 Other symptoms and signs involving emotional state: Secondary | ICD-10-CM

## 2021-06-04 DIAGNOSIS — H9201 Otalgia, right ear: Secondary | ICD-10-CM

## 2021-06-04 DIAGNOSIS — E039 Hypothyroidism, unspecified: Secondary | ICD-10-CM | POA: Diagnosis not present

## 2021-06-04 DIAGNOSIS — R630 Anorexia: Secondary | ICD-10-CM | POA: Diagnosis not present

## 2021-06-04 DIAGNOSIS — F410 Panic disorder [episodic paroxysmal anxiety] without agoraphobia: Secondary | ICD-10-CM

## 2021-06-04 DIAGNOSIS — F418 Other specified anxiety disorders: Secondary | ICD-10-CM

## 2021-06-04 DIAGNOSIS — G250 Essential tremor: Secondary | ICD-10-CM

## 2021-06-04 MED ORDER — LEVOTHYROXINE SODIUM 25 MCG PO TABS: 25.0000 ug | ORAL_TABLET | Freq: Every day | ORAL | 3 refills | Status: DC

## 2021-06-04 MED ORDER — SIMVASTATIN 20 MG PO TABS: 20.0000 mg | ORAL_TABLET | Freq: Every day | ORAL | 0 refills | Status: DC

## 2021-06-04 MED ORDER — PRIMIDONE 50 MG PO TABS: 50.0000 mg | ORAL_TABLET | Freq: Two times a day (BID) | ORAL | 3 refills | Status: DC

## 2021-06-04 MED ORDER — MIRTAZAPINE 7.5 MG PO TABS: 7.5000 mg | ORAL_TABLET | Freq: Every day | ORAL | 0 refills | Status: DC

## 2021-06-04 MED ORDER — METHYLPREDNISOLONE ACETATE 80 MG/ML IJ SUSP
80.0000 mg | Freq: Once | INTRAMUSCULAR | Status: AC
  Administered 2021-06-04: 10:00:00 80 mg via INTRAMUSCULAR

## 2021-06-04 MED ORDER — CIPROFLOXACIN HCL 500 MG PO TABS: 500.0000 mg | ORAL_TABLET | Freq: Two times a day (BID) | ORAL | 0 refills | Status: AC

## 2021-06-04 NOTE — Patient Instructions (Signed)
Earache, Adult An earache, or ear pain, can be caused by many things, including: An infection. Ear wax buildup. Ear pressure. Something in the ear that should not be there (foreign body). A sore throat. Tooth problems. Jaw problems. Treatment of the earache will depend on the cause. If the cause is not clear or cannot be determined, you may need to watch your symptoms until your earache goes away or until a cause is found. Follow these instructions at home: Medicines Take or apply over-the-counter and prescription medicines only as told by your health care provider. If you were prescribed an antibiotic medicine, use it as told by your health care provider. Do not stop using the antibiotic even if you start to feel better. Do not put anything in your ear other than medicine that is prescribed by your health care provider. Managing pain If directed, apply heat to the affected area as often as told by your health care provider. Use the heat source that your health care provider recommends, such as a moist heat pack or a heating pad. Place a towel between your skin and the heat source. Leave the heat on for 20-30 minutes. Remove the heat if your skin turns bright red. This is especially important if you are unable to feel pain, heat, or cold. You may have a greater risk of getting burned. If directed, put ice on the affected area as often as told by your health care provider. To do this:   Put ice in a plastic bag. Place a towel between your skin and the bag. Leave the ice on for 20 minutes, 2-3 times a day. General instructions Pay attention to any changes in your symptoms. Try resting in an upright position instead of lying down. This may help to reduce pressure in your ear and relieve pain. Chew gum if it helps to relieve your ear pain. Treat any allergies as told by your health care provider. Drink enough fluid to keep your urine pale yellow. It is up to you to get the results of any  tests that were done. Ask your health care provider, or the department that is doing the tests, when your results will be ready. Keep all follow-up visits as told by your health care provider. This is important. Contact a health care provider if: Your pain does not improve within 2 days. Your earache gets worse. You have new symptoms. You have a fever. Get help right away if you: Have a severe headache. Have a stiff neck. Have trouble swallowing. Have redness or swelling behind your ear. Have fluid or blood coming from your ear. Have hearing loss. Feel dizzy. Summary An earache, or ear pain, can be caused by many things. Treatment of the earache will depend on the cause. Follow recommendations from your health care provider to treat your ear pain. If the cause is not clear or cannot be determined, you may need to watch your symptoms until your earache goes away or until a cause is found. Keep all follow-up visits as told by your health care provider. This is important. This information is not intended to replace advice given to you by your health care provider. Make sure you discuss any questions you have with your health care provider. Document Revised: 01/09/2019 Document Reviewed: 01/09/2019 Elsevier Patient Education  2022 [employer]vier Inc[employer]  

## 2021-06-04 NOTE — Progress Notes (Signed)
Subjective:    Patient ID: [name], female    DOB: 1955/06/29, 65 y.o.   MRN: [id number]   Chief Complaint: Right ear pain (Swollen behind ear/)   HPI Patient comes in today c/o right ear pain. She says that ear has been hurting for over a month. She has been seen 3x and was treated with ciprodex drops. No oral antibiotics. Ear pain is no better. She has history of ear infection. She has seen ENT- she has a scanned scheduled fo rdecember 28.    Review of Systems  Constitutional:  Negative for diaphoresis.  HENT:  Positive for ear pain (right). Negative for ear discharge and sinus pressure.   Eyes:  Negative for pain.  Respiratory:  Negative for shortness of breath.   Cardiovascular:  Negative for chest pain, palpitations and leg swelling.  Gastrointestinal:  Negative for abdominal pain.  Endocrine: Negative for polydipsia.  Skin:  Negative for rash.  Neurological:  Negative for dizziness, weakness and headaches.  Hematological:  Does not bruise/bleed easily.  All other systems reviewed and are negative.     Objective:   Physical Exam Vitals reviewed.  Constitutional:      Appearance: Normal appearance.  HENT:     Right Ear: Tympanic membrane and ear canal normal. There is no impacted cerumen.     Left Ear: Tympanic membrane normal. There is no impacted cerumen.     Ears:     Comments: Right posterior auricle pain on palpation     Nose: Nose normal.     Mouth/Throat:     Mouth: Mucous membranes are moist.  Cardiovascular:     Rate and Rhythm: Normal rate and regular rhythm.     Heart sounds: Normal heart sounds.  Pulmonary:     Breath sounds: Normal breath sounds.  Neurological:     General: No focal deficit present.     Mental Status: She is alert and oriented to person, place, and time.  Psychiatric:        Mood and Affect: Mood normal.        Behavior: Behavior normal.    BP 125/71    Pulse 65    Temp (!) 97.5 F (36.4 C) (Temporal)    Resp 20    Ht  5\' 2"  (1.575 m)    Wt 118 lb (53.5 kg)    SpO2 99%    BMI 21.58 kg/m        Assessment & Plan:   [name] in today with chief complaint of Right ear pain (Swollen behind ear/)   1. Acute otalgia, right Keep appointment for scan - methylPREDNISolone acetate (DEPO-MEDROL) injection 80 mg - ciprofloxacin (CIPRO) 500 MG tablet; Take 1 tablet (500 mg total) by mouth 2 (two) times daily for 5 days.  Dispense: 10 tablet; Refill: 0  2. Acquired hypothyroidism Only refilled meds - levothyroxine (SYNTHROID) 25 MCG tablet; Take 1 tablet (25 mcg total) by mouth daily.  Dispense: 90 tablet; Refill: 3  3. Panic anxiety syndrome Only refilled meds - mirtazapine (REMERON) 7.5 MG tablet; Take 1 tablet (7.5 mg total) by mouth at bedtime. To help with anxiety/ appetite  Dispense: 90 tablet; Refill: 0  4. Poor appetite Only refilled meds - mirtazapine (REMERON) 7.5 MG tablet; Take 1 tablet (7.5 mg total) by mouth at bedtime. To help with anxiety/ appetite  Dispense: 90 tablet; Refill: 0  5. Anxiety about health Only refilled meds - mirtazapine (REMERON) 7.5 MG tablet; Take 1  tablet (7.5 mg total) by mouth at bedtime. To help with anxiety/ appetite  Dispense: 90 tablet; Refill: 0    The above assessment and management plan was discussed with the patient. The patient verbalized understanding of and has agreed to the management plan. Patient is aware to call the clinic if symptoms persist or worsen. Patient is aware when to return to the clinic for a follow-up visit. Patient educated on when it is appropriate to go to the emergency department.   [name] Done, FNP

## 2021-06-04 NOTE — Telephone Encounter (Signed)
Meds refilled.

## 2021-06-13 ENCOUNTER — Ambulatory Visit
Admission: RE | Admit: 2021-06-13 | Discharge: 2021-06-13 | Disposition: A | Discharge status: Home / Self Care | Payer: Medicare Other | Source: Ambulatory Visit | Attending: Physician Assistant | Admitting: Physician Assistant

## 2021-06-13 DIAGNOSIS — H9311 Tinnitus, right ear: Secondary | ICD-10-CM

## 2021-06-13 DIAGNOSIS — H903 Sensorineural hearing loss, bilateral: Secondary | ICD-10-CM

## 2021-06-13 MED ORDER — GADOBENATE DIMEGLUMINE 529 MG/ML IV SOLN
10.0000 mL | Freq: Once | INTRAVENOUS | Status: AC | PRN
  Administered 2021-06-13: 14:00:00 10 mL via INTRAVENOUS

## 2021-07-10 ENCOUNTER — Encounter: Payer: Self-pay | Admitting: Family Medicine

## 2021-07-10 ENCOUNTER — Ambulatory Visit (INDEPENDENT_AMBULATORY_CARE_PROVIDER_SITE_OTHER): Payer: Medicare Other | Admitting: Family Medicine

## 2021-07-10 VITALS — BP 109/65 | HR 63 | Temp 97.8°F | Ht 62.0 in | Wt 117.2 lb

## 2021-07-10 DIAGNOSIS — F418 Other specified anxiety disorders: Secondary | ICD-10-CM | POA: Diagnosis not present

## 2021-07-10 DIAGNOSIS — R4589 Other symptoms and signs involving emotional state: Secondary | ICD-10-CM

## 2021-07-10 DIAGNOSIS — H65114 Acute and subacute allergic otitis media (mucoid) (sanguinous) (serous), recurrent, right ear: Secondary | ICD-10-CM

## 2021-07-10 DIAGNOSIS — R630 Anorexia: Secondary | ICD-10-CM | POA: Diagnosis not present

## 2021-07-10 DIAGNOSIS — F410 Panic disorder [episodic paroxysmal anxiety] without agoraphobia: Secondary | ICD-10-CM

## 2021-07-10 MED ORDER — SIMVASTATIN 20 MG PO TABS: 20.0000 mg | ORAL_TABLET | Freq: Every day | ORAL | 3 refills | Status: DC

## 2021-07-10 MED ORDER — MIRTAZAPINE 7.5 MG PO TABS: 7.5000 mg | ORAL_TABLET | Freq: Every day | ORAL | 3 refills | Status: DC

## 2021-07-10 MED ORDER — PREDNISONE 10 MG (21) PO TBPK: ORAL_TABLET | ORAL | 0 refills | Status: DC

## 2021-07-10 MED ORDER — CIPROFLOXACIN HCL 500 MG PO TABS: 500.0000 mg | ORAL_TABLET | Freq: Two times a day (BID) | ORAL | 0 refills | Status: AC

## 2021-07-10 NOTE — Progress Notes (Signed)
Subjective: CC: Follow-up depression, anxiety PCP: Janora Norlander, DO YTK:[name] is a 66 y.o. female presenting to clinic today for:  1.  Follow-up mood Patient reports great improvement in anxiety and sleep with mirtazapine 7.5 mg daily.  She is requested to continue this medication.  No adverse side effects identified  2.  Recurrent right otitis media Patient has been treated successfully with Cipro for her recurrent otitis media.  She has failed topical eardrops in the past.  She saw ENT and was noted to have scarring in the right TM.  She had MRI done which showed some chronic sinusitis but she is not advised to change anything about what she is doing.  She is currently utilizing Flonase religiously.  Would like to have a prescription on hand for recurrent ear infection   ROS: Per HPI  Allergies  Allergen Reactions   Barium-Containing Compounds     hives   Percocet [Oxycodone-Acetaminophen] Other (See Comments)    migraine    Past Medical History:  Diagnosis Date   Cancer ([place])    cervical 2003   Vertigo     Current Outpatient Medications:    ciprofloxacin (CIPRO) 500 MG tablet, Take 1 tablet (500 mg total) by mouth 2 (two) times daily for 5 days., Disp: 10 tablet, Rfl: 0   levothyroxine (SYNTHROID) 25 MCG tablet, Take 1 tablet (25 mcg total) by mouth daily., Disp: 90 tablet, Rfl: 3   mirtazapine (REMERON) 7.5 MG tablet, Take 1 tablet (7.5 mg total) by mouth at bedtime. To help with anxiety/ appetite, Disp: 90 tablet, Rfl: 0   predniSONE (STERAPRED UNI-PAK 21 TAB) 10 MG (21) TBPK tablet, As directed x 6 days, Disp: 21 tablet, Rfl: 0   primidone (MYSOLINE) 50 MG tablet, Take 1 tablet (50 mg total) by mouth 2 (two) times daily., Disp: 180 tablet, Rfl: 3   simvastatin (ZOCOR) 20 MG tablet, Take 1 tablet (20 mg total) by mouth daily at 6 PM., Disp: 90 tablet, Rfl: 0 Social History   Socioeconomic History   Marital status: Married    Spouse name: Not on file    Number of children: 1   Years of education: Not on file   Highest education level: Not on file  Occupational History   Occupation: retired    Comment: Scientist, water quality garden department  Tobacco Use   Smoking status: Some Days    Packs/day: 0.15    Types: Cigarettes   Smokeless tobacco: Never  Vaping Use   Vaping Use: Never used  Substance and Sexual Activity   Alcohol use: Yes   Drug use: No   Sexual activity: Not on file  Other Topics Concern   Not on file  Social History Narrative   Patient is a retired Scientist, 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 BP 109/65    Pulse 63    Temp  97.8 F (36.6 C)    Ht 5\' 2"  (1.575 m)    Wt 117 lb 3.2 oz (53.2 kg)    SpO2 97%    BMI 21.44 kg/m   Physical Examination:  General: Awake, alert, well nourished, No acute distress HEENT: No exophthalmos.  No goiter.  No active drainage from the right ear Cardio: regular rate and rhythm  Pulm:   normal work of breathing on room air Psych: Pleasant, interactive.  Mood is stable.  Depression screen [facility] 2/9 07/10/2021 06/04/2021 05/23/2021  Decreased Interest 0 1 1  Down, Depressed, Hopeless 0 0 0  PHQ - 2 Score 0 1 1  Altered sleeping 0 3 1  Tired, decreased energy 1 3 2   Change in appetite 0 0 0  Feeling bad or failure about yourself  0 0 0  Trouble concentrating 0 0 0  Moving slowly or fidgety/restless 0 0 1  Suicidal thoughts 0 0 0  PHQ-9 Score 1 7 5   Difficult doing  work/chores Not difficult at all Not difficult at all Somewhat difficult  Some recent data might be hidden   GAD 7 : Generalized Anxiety Score 07/10/2021 06/04/2021 05/23/2021 05/09/2021  Nervous, Anxious, on Edge 0 2 3 3   Control/stop worrying 0 0 0 0  Worry too much - different things 0 0 0 1  Trouble relaxing 0 0 0 0  Restless 0 0 0 0  Easily annoyed or irritable 0 1 1 1   Afraid - awful might happen 0 0 0 0  Total GAD 7 Score 0 3 4 5   Anxiety Difficulty Not difficult at all Not difficult at all Somewhat difficult Somewhat difficult    Assessment/ Plan: 66 y.o. female   Recurrent subacute allergic otitis media of right ear - Plan: ciprofloxacin (CIPRO) 500 MG tablet, predniSONE (STERAPRED UNI-PAK 21 TAB) 10 MG (21) TBPK tablet  Panic anxiety syndrome - Plan: mirtazapine (REMERON) 7.5 MG tablet  Poor appetite - Plan: mirtazapine (REMERON) 7.5 MG tablet  Anxiety about health - Plan: mirtazapine (REMERON) 7.5 MG tablet  Cipro and prednisone provided for recurrent otitis media.  She has been evaluated by ENT and chronic sinusitis appreciated  Anxiety appetite and sleep have improved tremendously with the mirtazapine 7.5 mg daily.  This is been renewed.  She may follow-up in 6 months for routine thyroid check  No orders of the defined types were placed in this encounter.  Meds ordered this encounter  Medications   ciprofloxacin (CIPRO) 500 MG tablet    Sig: Take 1 tablet (500 mg total) by mouth 2 (two) times daily for 5 days.    Dispense:  10 tablet    Refill:  0   predniSONE (STERAPRED UNI-PAK 21 TAB) 10 MG (21) TBPK tablet    Sig: As directed x 6 days    Dispense:  21 tablet    Refill:  0     Earsie Humm Windell Moulding, DO [place] [phone]

## 2021-08-21 ENCOUNTER — Ambulatory Visit (INDEPENDENT_AMBULATORY_CARE_PROVIDER_SITE_OTHER): Payer: Medicare Other | Admitting: Nurse Practitioner

## 2021-08-21 ENCOUNTER — Encounter: Payer: Self-pay | Admitting: Nurse Practitioner

## 2021-08-21 ENCOUNTER — Telehealth: Payer: Self-pay | Admitting: Family Medicine

## 2021-08-21 VITALS — BP 107/61 | HR 63 | Temp 97.9°F | Resp 20 | Ht 62.0 in | Wt 117.0 lb

## 2021-08-21 DIAGNOSIS — B3731 Acute candidiasis of vulva and vagina: Secondary | ICD-10-CM

## 2021-08-21 DIAGNOSIS — N3000 Acute cystitis without hematuria: Secondary | ICD-10-CM

## 2021-08-21 DIAGNOSIS — R300 Dysuria: Secondary | ICD-10-CM | POA: Diagnosis not present

## 2021-08-21 LAB — URINALYSIS, COMPLETE
Bilirubin, UA: NEGATIVE
Glucose, UA: NEGATIVE
Ketones, UA: NEGATIVE
Nitrite, UA: NEGATIVE
Protein,UA: NEGATIVE
Specific Gravity, UA: 1.01 (ref 1.005–1.030)
Urobilinogen, Ur: 0.2 mg/dL (ref 0.2–1.0)
pH, UA: 7 (ref 5.0–7.5)

## 2021-08-21 LAB — MICROSCOPIC EXAMINATION: WBC, UA: >30 /hpf — AB (ref 0–5)

## 2021-08-21 MED ORDER — SULFAMETHOXAZOLE-TRIMETHOPRIM 800-160 MG PO TABS: 1.0000 | ORAL_TABLET | Freq: Two times a day (BID) | ORAL | 0 refills | Status: DC

## 2021-08-21 MED ORDER — FLUCONAZOLE 150 MG PO TABS: ORAL_TABLET | ORAL | 0 refills | Status: DC

## 2021-08-21 NOTE — Progress Notes (Signed)
? ?  Subjective:  ? ? Patient ID: [name], female    DOB: 13-Jan-1956, 66 y.o.   MRN: [id number] ? ? ?Chief Complaint: Dysuria and Vaginal Itching ? ? ?HPI ?Patient comes in today c/o urgency and frequency and urine looks milky to her. She has some vaginal itching as well. This started about 4 days ago. She has tried no OTC meds. ? ? ? ?Review of Systems  ?Constitutional:  Negative for chills and fatigue.  ?Genitourinary:  Positive for dysuria, frequency and urgency. Negative for flank pain, pelvic pain, vaginal bleeding and vaginal discharge.  ?All other systems reviewed and are negative. ? ?   ?Objective:  ? Physical Exam ?Constitutional:   ?   Appearance: Normal appearance.  ?Cardiovascular:  ?   Rate and Rhythm: Normal rate and regular rhythm.  ?   Heart sounds: Normal heart sounds.  ?Pulmonary:  ?   Effort: Pulmonary effort is normal.  ?   Breath sounds: Normal breath sounds.  ?Abdominal:  ?   Tenderness: There is no right CVA tenderness or left CVA tenderness.  ?Skin: ?   General: Skin is warm.  ?Neurological:  ?   General: No focal deficit present.  ?   Mental Status: She is alert and oriented to person, place, and time.  ?Psychiatric:     ?   Mood and Affect: Mood normal.     ?   Behavior: Behavior normal.  ? ?BP 107/61   Pulse 63   Temp 97.9 ?F (36.6 ?C) (Temporal)   Resp 20   Ht '5\' 2"'$  (1.575 m)   Wt 117 lb (53.1 kg)   SpO2 99%   BMI 21.40 kg/m?  ? ?UA- Leuks 3+ ? ? ? ?   ?Assessment & Plan:  ?[name] in today with chief complaint of Dysuria and Vaginal Itching ? ? ?1. Dysuria ?- Urinalysis, Complete ?- Urine Culture ? ?2. Acute cystitis without hematuria ?Take medication as prescribe ?Cotton underwear ?Take shower not bath ?Cranberry juice, yogurt ?Force fluids ?AZO over the counter X2 days ?Culture pending ?RTO prn ? ?- sulfamethoxazole-trimethoprim (BACTRIM DS) 800-160 MG tablet; Take 1 tablet by mouth 2 (two) times daily.  Dispense: 14 tablet; Refill: 0 ? ?3. Vaginal candidiasis ?Avoid  bubble baths ?- fluconazole (DIFLUCAN) 150 MG tablet; 1 po q week x 4 weeks  Dispense: 2 tablet; Refill: 0 ? ? ? ?The above assessment and management plan was discussed with the patient. The patient verbalized understanding of and has agreed to the management plan. Patient is aware to call the clinic if symptoms persist or worsen. Patient is aware when to return to the clinic for a follow-up visit. Patient educated on when it is appropriate to go to the emergency department.  ? ?[name] Done, FNP ? ? ? ?

## 2021-08-21 NOTE — Telephone Encounter (Signed)
Diflucan prescription corrected ?

## 2021-08-21 NOTE — Patient Instructions (Signed)
Vaginal Yeast Infection, Adult ?Vaginal yeast infection is a condition that causes vaginal discharge as well as soreness, swelling, and redness (inflammation) of the vagina. This is a common condition. Some women get this infection frequently. ?What are the causes? ?This condition is caused by a change in the normal balance of the yeast (Candida) and normal bacteria that live in the vagina. This change causes an overgrowth of yeast, which causes the inflammation. ?What increases the risk? ?The condition is more likely to develop in women who: ?Take antibiotic medicines. ?Have diabetes. ?Take birth control pills. ?Are pregnant. ?Douche often. ?Have a weak body defense system (immune system). ?Have been taking steroid medicines for a long time. ?Frequently wear tight clothing. ?What are the signs or symptoms? ?Symptoms of this condition include: ?White, thick, creamy vaginal discharge. ?Swelling, itching, redness, and irritation of the vagina. The lips of the vagina (labia) may be affected as well. ?Pain or a burning feeling while urinating. ?Pain during sex. ?How is this diagnosed? ?This condition is diagnosed based on: ?Your medical history. ?A physical exam. ?A pelvic exam. Your health care provider will examine a sample of your vaginal discharge under a microscope. Your health care provider may send this sample for testing to confirm the diagnosis. ?How is this treated? ?This condition is treated with medicine. Medicines may be over-the-counter or prescription. You may be told to use one or more of the following: ?Medicine that is taken by mouth (orally). ?Medicine that is applied as a cream (topically). ?Medicine that is inserted directly into the vagina (suppository). ?Follow these instructions at home: ?Take or apply over-the-counter and prescription medicines only as told by your health care provider. ?Do not use tampons until your health care provider approves. ?Do not have sex until your infection has  cleared. Sex can prolong or worsen your symptoms of infection. Ask your health care provider when it is safe to resume sexual activity. ?Keep all follow-up visits. This is important. ?How is this prevented? ? ?Do not wear tight clothes, such as pantyhose or tight pants. ?Wear breathable cotton underwear. ?Do not use douches, perfumed soap, creams, or powders. ?Wipe from front to back after using the toilet. ?If you have diabetes, keep your blood sugar levels under control. ?Ask your health care provider for other ways to prevent yeast infections. ?Contact a health care provider if: ?You have a fever. ?Your symptoms go away and then return. ?Your symptoms do not get better with treatment. ?Your symptoms get worse. ?You have new symptoms. ?You develop blisters in or around your vagina. ?You have blood coming from your vagina and it is not your menstrual period. ?You develop pain in your abdomen. ?Summary ?Vaginal yeast infection is a condition that causes discharge as well as soreness, swelling, and redness (inflammation) of the vagina. ?This condition is treated with medicine. Medicines may be over-the-counter or prescription. ?Take or apply over-the-counter and prescription medicines only as told by your health care provider. ?Do not douche. Resume sexual activity or use of tampons as instructed by your health care provider. ?Contact a health care provider if your symptoms do not get better with treatment or your symptoms go away and then return. ?This information is not intended to replace advice given to you by your health care provider. Make sure you discuss any questions you have with your health care provider. ?Document Revised: 08/21/2020 Document Reviewed: 08/21/2020 ?Elsevier Patient Education ? [place]. ? ?

## 2021-08-24 LAB — URINE CULTURE

## 2021-09-19 ENCOUNTER — Ambulatory Visit (INDEPENDENT_AMBULATORY_CARE_PROVIDER_SITE_OTHER): Payer: Medicare Other | Admitting: Nurse Practitioner

## 2021-09-19 ENCOUNTER — Encounter: Payer: Self-pay | Admitting: Nurse Practitioner

## 2021-09-19 DIAGNOSIS — R300 Dysuria: Secondary | ICD-10-CM

## 2021-09-19 DIAGNOSIS — N898 Other specified noninflammatory disorders of vagina: Secondary | ICD-10-CM

## 2021-09-19 MED ORDER — PHENAZOPYRIDINE HCL 95 MG PO TABS: 95.0000 mg | ORAL_TABLET | Freq: Three times a day (TID) | ORAL | 0 refills | Status: DC | PRN

## 2021-09-19 NOTE — Patient Instructions (Signed)
Dysuria ?Dysuria is pain or discomfort during urination. The pain or discomfort may be felt in the part of the body that drains urine from the bladder (urethra) or in the surrounding tissue of the genitals. The pain may also be felt in the groin area, lower abdomen, or lower back. ?You may have to urinate frequently or have the sudden feeling that you have to urinate (urgency). Dysuria can affect anyone, but it is more common in females. Dysuria can be caused by many different things, including: ?Urinary tract infection. ?Kidney stones or bladder stones. ?Certain STIs (sexually transmitted infections), such as chlamydia. ?Dehydration. ?Inflammation of the tissues of the vagina. ?Use of certain medicines. ?Use of certain soaps or scented products that cause irritation. ?Follow these instructions at home: ?Medicines ?Take over-the-counter and prescription medicines only as told by your health care provider. ?If you were prescribed an antibiotic medicine, take it as told by your health care provider. Do not stop taking the antibiotic even if you start to feel better. ?Eating and drinking ? ?Drink enough fluid to keep your urine pale yellow. ?Avoid caffeinated beverages, tea, and alcohol. These beverages can irritate the bladder and make dysuria worse. In males, alcohol may irritate the prostate. ?General instructions ?Watch your condition for any changes. ?Urinate often. Avoid holding urine for long periods of time. ?If you are female, you should wipe from front to back after urinating or having a bowel movement. Use each piece of toilet paper only once. ?Empty your bladder after sex. ?Keep all follow-up visits. This is important. ?If you had any tests done to find the cause of dysuria, it is up to you to get your test results. Ask your health care provider, or the department that is doing the test, when your results will be ready. ?Contact a health care provider if: ?You have a fever. ?You develop pain in your back or  sides. ?You have nausea or vomiting. ?You have blood in your urine. ?You are not urinating as often as you usually do. ?Get help right away if: ?Your pain is severe and not relieved with medicines. ?You cannot eat or drink without vomiting. ?You are confused. ?You have a rapid heartbeat while resting. ?You have shaking or chills. ?You feel extremely weak. ?Summary ?Dysuria is pain or discomfort while urinating. Many different conditions can lead to dysuria. ?If you have dysuria, you may have to urinate frequently or have the sudden feeling that you have to urinate (urgency). ?Watch your condition for any changes. Keep all follow-up visits. ?Make sure that you urinate often and drink enough fluid to keep your urine pale yellow. ?This information is not intended to replace advice given to you by your health care provider. Make sure you discuss any questions you have with your health care provider. ?Document Revised: 01/14/2020 Document Reviewed: 01/14/2020 ?Elsevier Patient Education ? 2022 Elsevier Inc[employer] ? ?

## 2021-09-19 NOTE — Progress Notes (Signed)
? ?  Virtual Visit  Note ?Due to COVID-19 pandemic this visit was conducted virtually. This visit type was conducted due to national recommendations for restrictions regarding the COVID-19 Pandemic (e.g. social distancing, sheltering in place) in an effort to limit this patient's exposure and mitigate transmission in our community. All issues noted in this document were discussed and addressed.  A physical exam was not performed with this format. ? ?I connected with [name] on 09/20/21 at 9 PM by telephone and verified that I am speaking with the correct person using two identifiers. [name] is currently located at home during visit. The provider, Ivy Lynn, NP is located in their office at time of visit. ? ?I discussed the limitations, risks, security and privacy concerns of performing an evaluation and management service by telephone and the availability of in person appointments. I also discussed with the patient that there may be a patient responsible charge related to this service. The patient expressed understanding and agreed to proceed. ? ? ?History and Present Illness: ? ?Dysuria  ?Pertinent negatives include no chills, flank pain or frequency. She has tried nothing for the symptoms.  ?Vaginal Discharge ?The patient's primary symptoms include vaginal discharge. The patient's pertinent negatives include no genital itching or missed menses. This is a new problem. The current episode started yesterday. The problem occurs constantly. The problem has been unchanged. The pain is moderate. The problem affects both sides. Associated symptoms include dysuria. Pertinent negatives include no abdominal pain, chills, flank pain or frequency. The vaginal discharge was normal.  ? ? ? ?Review of Systems  ?Constitutional: Negative.  Negative for chills.  ?HENT: Negative.    ?Respiratory: Negative.    ?Cardiovascular: Negative.   ?Gastrointestinal:  Negative for abdominal pain.  ?Genitourinary:  Positive for  dysuria and vaginal discharge. Negative for flank pain, frequency and missed menses.  ?Musculoskeletal: Negative.   ?Skin: Negative.   ?Neurological: Negative.   ?Psychiatric/Behavioral: Negative.    ?All other systems reviewed and are negative. ? ? ?Observations/Objective: ?Televisit patient not in distress ? ?Assessment and Plan: ?Urinalysis and culture completed results pending ?Wet prep orders placed pending results. ? ?Follow Up Instructions: ?Follow-up with worsening unresolved symptoms ? ?  ?I discussed the assessment and treatment plan with the patient. The patient was provided an opportunity to ask questions and all were answered. The patient agreed with the plan and demonstrated an understanding of the instructions. ?  ?The patient was advised to call back or seek an in-person evaluation if the symptoms worsen or if the condition fails to improve as anticipated. ? ?The above assessment and management plan was discussed with the patient. The patient verbalized understanding of and has agreed to the management plan. Patient is aware to call the clinic if symptoms persist or worsen. Patient is aware when to return to the clinic for a follow-up visit. Patient educated on when it is appropriate to go to the emergency department.  ? ?Time call ended: 5:12 PM ? ?I provided 12 minutes of  non face-to-face time during this encounter. ? ? ? ?Ivy Lynn, NP ?  ?

## 2021-09-20 ENCOUNTER — Other Ambulatory Visit: Payer: Medicare Other

## 2021-09-20 DIAGNOSIS — R300 Dysuria: Secondary | ICD-10-CM | POA: Diagnosis not present

## 2021-09-20 LAB — URINALYSIS, ROUTINE W REFLEX MICROSCOPIC
Bilirubin, UA: NEGATIVE
Glucose, UA: NEGATIVE
Ketones, UA: NEGATIVE
Nitrite, UA: NEGATIVE
Protein,UA: NEGATIVE
Specific Gravity, UA: 1.01 (ref 1.005–1.030)
Urobilinogen, Ur: 0.2 mg/dL (ref 0.2–1.0)
pH, UA: 7 (ref 5.0–7.5)

## 2021-09-20 LAB — MICROSCOPIC EXAMINATION
Renal Epithel, UA: NONE SEEN /hpf
WBC, UA: >30 /hpf — AB (ref 0–5)

## 2021-09-28 ENCOUNTER — Ambulatory Visit: Payer: Medicare Other | Admitting: Family Medicine

## 2021-09-28 ENCOUNTER — Other Ambulatory Visit: Payer: Medicare Other

## 2021-09-28 ENCOUNTER — Telehealth: Payer: Self-pay | Admitting: Family Medicine

## 2021-09-28 ENCOUNTER — Other Ambulatory Visit: Payer: Self-pay | Admitting: Nurse Practitioner

## 2021-09-28 DIAGNOSIS — R8290 Unspecified abnormal findings in urine: Secondary | ICD-10-CM

## 2021-09-28 DIAGNOSIS — N898 Other specified noninflammatory disorders of vagina: Secondary | ICD-10-CM

## 2021-09-28 LAB — CULTURE, URINE COMPREHENSIVE

## 2021-09-28 LAB — WET PREP FOR TRICH, YEAST, CLUE
Clue Cell Exam: NEGATIVE
Trichomonas Exam: NEGATIVE
Yeast Exam: NEGATIVE

## 2021-09-28 MED ORDER — CIPROFLOXACIN HCL 250 MG PO TABS: 250.0000 mg | ORAL_TABLET | Freq: Two times a day (BID) | ORAL | 0 refills | Status: AC

## 2021-09-28 MED ORDER — FLUCONAZOLE 150 MG PO TABS: 150.0000 mg | ORAL_TABLET | Freq: Once | ORAL | 0 refills | Status: AC

## 2021-09-28 NOTE — Telephone Encounter (Signed)
I spoke to patient extensively over the phone. Antibiotics sent to pharmacy, labs reviewed with patient over the phone. Patient knows to call back with unresolved symptoms ?

## 2021-09-28 NOTE — Telephone Encounter (Signed)
Patient had telephone visit with Je on 09/19/21.  Has received no calls or medication regarding her visit.  Patient is experiencing a lot of pain.  Brought urine in, but was not told to do a wet prep.  Patient is very upset.  Please call ?

## 2021-11-15 ENCOUNTER — Encounter: Payer: Self-pay | Admitting: Nurse Practitioner

## 2021-11-15 ENCOUNTER — Ambulatory Visit (INDEPENDENT_AMBULATORY_CARE_PROVIDER_SITE_OTHER): Payer: Medicare Other | Admitting: Nurse Practitioner

## 2021-11-15 VITALS — BP 120/76 | HR 57 | Temp 98.0°F | Resp 20 | Ht 62.0 in | Wt 119.0 lb

## 2021-11-15 DIAGNOSIS — R42 Dizziness and giddiness: Secondary | ICD-10-CM | POA: Diagnosis not present

## 2021-11-15 MED ORDER — AMOXICILLIN 875 MG PO TABS: 875.0000 mg | ORAL_TABLET | Freq: Two times a day (BID) | ORAL | 0 refills | Status: DC

## 2021-11-15 MED ORDER — PREDNISONE 20 MG PO TABS: 40.0000 mg | ORAL_TABLET | Freq: Every day | ORAL | 0 refills | Status: AC

## 2021-11-15 MED ORDER — METHYLPREDNISOLONE ACETATE 80 MG/ML IJ SUSP
80.0000 mg | Freq: Once | INTRAMUSCULAR | Status: AC
  Administered 2021-11-15: 80 mg via INTRAMUSCULAR

## 2021-11-15 NOTE — Progress Notes (Signed)
   Subjective:    Patient ID: [name], female    DOB: 11/15/1955, 66 y.o.   MRN: [id number]   Chief Complaint: Dizzness (Thinks its inner ear)   HPI Patient come sin to day c/o inner ear issues. Started last week. When she changes positions in bed she gets dizzy. Walking makes her dizzy. She has had this several times in the past.     Review of Systems  Constitutional:  Negative for diaphoresis.  Eyes:  Negative for pain.  Respiratory:  Negative for shortness of breath.   Cardiovascular:  Negative for chest pain, palpitations and leg swelling.  Gastrointestinal:  Negative for abdominal pain.  Endocrine: Negative for polydipsia.  Skin:  Negative for rash.  Neurological:  Positive for dizziness. Negative for weakness and headaches.  Hematological:  Does not bruise/bleed easily.  All other systems reviewed and are negative.     Objective:   Physical Exam Vitals reviewed.  Constitutional:      Appearance: Normal appearance.  HENT:     Right Ear: Tympanic membrane normal.     Left Ear: Tympanic membrane normal.  Cardiovascular:     Rate and Rhythm: Normal rate and regular rhythm.     Heart sounds: Normal heart sounds.  Pulmonary:     Effort: Pulmonary effort is normal.     Breath sounds: Normal breath sounds.  Neurological:     General: No focal deficit present.     Mental Status: She is alert and oriented to person, place, and time.  Psychiatric:        Mood and Affect: Mood normal.        Behavior: Behavior normal.   BP 120/76   Pulse (!) 57   Temp 98 F (36.7 C) (Temporal)   Resp 20   Ht '5\' 2"'$  (1.575 m)   Wt 119 lb (54 kg)   SpO2 97%   BMI 21.77 kg/m         Assessment & Plan:  [name] in today with chief complaint of Dizzness (Thinks its inner ear)   1. Vertigo Force fluids Change positions slowly - amoxicillin (AMOXIL) 875 MG tablet; Take 1 tablet (875 mg total) by mouth 2 (two) times daily. 1 po BID  Dispense: 20 tablet; Refill: 0 -  methylPREDNISolone acetate (DEPO-MEDROL) injection 80 mg - predniSONE (DELTASONE) 20 MG tablet; Take 2 tablets (40 mg total) by mouth daily with breakfast for 5 days. 2 po daily for 5 days  Dispense: 10 tablet; Refill: 0    The above assessment and management plan was discussed with the patient. The patient verbalized understanding of and has agreed to the management plan. Patient is aware to call the clinic if symptoms persist or worsen. Patient is aware when to return to the clinic for a follow-up visit. Patient educated on when it is appropriate to go to the emergency department.   [name] Done, FNP

## 2021-11-15 NOTE — Patient Instructions (Signed)
Vertigo Vertigo is the feeling that you or the things around you are moving when they are not. This feeling can come and go at any time. Vertigo often goes away on its own. This condition can be dangerous if it happens when you are doing activities like driving or working with machines. Your doctor will do tests to find the cause of your vertigo. These tests will also help your doctor decide on the best treatment for you. Follow these instructions at home: Eating and drinking     Drink enough fluid to keep your pee (urine) pale yellow. Do not drink alcohol. Activity Return to your normal activities when your doctor says that it is safe. In the morning, first sit up on the side of the bed. When you feel okay, stand slowly while you hold onto something until you know that your balance is fine. Move slowly. Avoid sudden body or head movements or certain positions, as told by your doctor. Use a cane if you have trouble standing or walking. Sit down right away if you feel dizzy. Avoid doing any tasks or activities that can cause danger to you or others if you get dizzy. Avoid bending down if you feel dizzy. Place items in your home so that they are easy for you to reach without bending or leaning over. Do not drive or use machinery if you feel dizzy. General instructions Take over-the-counter and prescription medicines only as told by your doctor. Keep all follow-up visits. Contact a doctor if: Your medicine does not help your vertigo. Your problems get worse or you have new symptoms. You have a fever. You feel like you may vomit (nauseous), or this feeling gets worse. You start to vomit. Your family or friends see changes in how you act. You lose feeling (have numbness) in part of your body. You feel prickling and tingling in a part of your body. Get help right away if: You are always dizzy. You faint. You get very bad headaches. You get a stiff neck. Bright light starts to bother  you. You have trouble moving or talking. You feel weak in your hands, arms, or legs. You have changes in your hearing or in how you see (vision). These symptoms may be an emergency. Get help right away. Call your local emergency services (911 in the [place] Do not wait to see if the symptoms will go away. Do not drive yourself to the hospital. Summary Vertigo is the feeling that you or the things around you are moving when they are not. Your doctor will do tests to find the cause of your vertigo. You may be told to avoid some tasks, positions, or movements. Contact a doctor if your medicine is not helping, or if you have a fever, new symptoms, or a change in how you act. Get help right away if you get very bad headaches, or if you have changes in how you speak, hear, or see. This information is not intended to replace advice given to you by your health care provider. Make sure you discuss any questions you have with your health care provider. Document Revised: 05/03/2020 Document Reviewed: 05/03/2020 Elsevier Patient Education  2023 [employer]  

## 2022-02-20 ENCOUNTER — Encounter: Payer: Self-pay | Admitting: Nurse Practitioner

## 2022-02-20 ENCOUNTER — Ambulatory Visit (INDEPENDENT_AMBULATORY_CARE_PROVIDER_SITE_OTHER): Payer: Medicare Other | Admitting: Nurse Practitioner

## 2022-02-20 VITALS — BP 109/76 | HR 72 | Temp 98.6°F | Ht 62.0 in | Wt 123.0 lb

## 2022-02-20 DIAGNOSIS — R0981 Nasal congestion: Secondary | ICD-10-CM | POA: Diagnosis not present

## 2022-02-20 MED ORDER — METHYLPREDNISOLONE ACETATE 40 MG/ML IJ SUSP
40.0000 mg | Freq: Once | INTRAMUSCULAR | Status: AC
  Administered 2022-02-20: 40 mg via INTRAMUSCULAR

## 2022-02-20 MED ORDER — AZITHROMYCIN 250 MG PO TABS: ORAL_TABLET | ORAL | 0 refills | Status: AC

## 2022-02-20 NOTE — Progress Notes (Signed)
Acute Office Visit  Subjective:     Patient ID: [name], female    DOB: [birth year], 66 y.o.   MRN: [id number]  Chief Complaint  Patient presents with   Ear Drainage   Nausea   Nasal Congestion    Ear Drainage  There is pain in the right ear. This is a recurrent problem. The problem occurs constantly. The problem has been gradually worsening. There has been no fever. The pain is moderate. Associated symptoms include headaches. Pertinent negatives include no coughing, ear discharge, rash or sore throat. She has tried nothing for the symptoms.  URI  This is a new problem. The current episode started in the past 7 days. The problem has been unchanged. There has been no fever. Associated symptoms include congestion, ear pain, headaches and sinus pain. Pertinent negatives include no coughing, rash or sore throat. She has tried nothing for the symptoms.     Review of Systems  Constitutional: Negative.  Negative for chills and fever.  HENT:  Positive for congestion, ear pain, sinus pain and tinnitus. Negative for ear discharge and sore throat.   Respiratory: Negative.  Negative for cough.   Cardiovascular: Negative.   Gastrointestinal: Negative.   Genitourinary: Negative.   Musculoskeletal: Negative.   Skin: Negative.  Negative for itching and rash.  Neurological:  Positive for headaches.  All other systems reviewed and are negative.       Objective:    BP 109/76   Pulse 72   Temp 98.6 F (37 C)   Ht '5\' 2"'$  (1.575 m)   Wt 123 lb (55.8 kg)   SpO2 98%   BMI 22.50 kg/m  BP Readings from Last 3 Encounters:  02/20/22 109/76  11/15/21 120/76  08/21/21 107/61   Wt Readings from Last 3 Encounters:  02/20/22 123 lb (55.8 kg)  11/15/21 119 lb (54 kg)  08/21/21 117 lb (53.1 kg)      Physical Exam Vitals and nursing note reviewed.  Constitutional:      Appearance: Normal appearance.  HENT:     Head: Normocephalic.     Right Ear: External ear normal. There is no  impacted cerumen.     Left Ear: External ear normal. There is no impacted cerumen.     Nose: Congestion present.     Mouth/Throat:     Mouth: Mucous membranes are moist.     Pharynx: Oropharynx is clear.  Eyes:     Conjunctiva/sclera: Conjunctivae normal.  Cardiovascular:     Rate and Rhythm: Normal rate and regular rhythm.     Pulses: Normal pulses.     Heart sounds: Normal heart sounds.  Pulmonary:     Effort: Pulmonary effort is normal.     Breath sounds: Normal breath sounds.  Abdominal:     General: Bowel sounds are normal.  Skin:    General: Skin is warm.     Findings: No erythema or rash.  Neurological:     General: No focal deficit present.     Mental Status: She is alert and oriented to person, place, and time.  Psychiatric:        Mood and Affect: Mood normal.        Behavior: Behavior normal.     No results found for any visits on 02/20/22.      Assessment & Plan:  Patient presents with URI symptoms, congestin and sinus pain. Symptoms not well controlled. Advised patient to :  Take meds as prescribed - Use  a cool mist humidifier  -Use saline nose sprays frequently -Force fluids -For fever or aches or pains- take Tylenol or ibuprofen. -Azithromycin 500 mg tablet by mouth day 1, 250 mg tablet by mouth day 2-5 -DepoMedrol shot given in clinic.  -If symptoms do not improve, she may need to be COVID tested to rule this out Follow up with worsening unresolved symptoms  Problem List Items Addressed This Visit   None Visit Diagnoses     Sinus congestion    -  Primary   Relevant Medications   azithromycin (ZITHROMAX) 250 MG tablet   methylPREDNISolone acetate (DEPO-MEDROL) injection 40 mg (Completed) (Start on 02/20/2022 10:00 AM)       Meds ordered this encounter  Medications   azithromycin (ZITHROMAX) 250 MG tablet    Sig: Take 2 tablets on day 1, then 1 tablet daily on days 2 through 5    Dispense:  6 tablet    Refill:  0    Order Specific Question:    Supervising Provider    Answer:   Claretta Fraise [[id number]]   methylPREDNISolone acetate (DEPO-MEDROL) injection 40 mg    Return if symptoms worsen or fail to improve.  Ivy Lynn, NP

## 2022-02-20 NOTE — Patient Instructions (Signed)
Sinus Pain  Sinus pain happens when your sinuses get swollen or blocked (clogged). Sinuses are spaces behind the bones of your face and forehead. You may feel pain or pressure in your face, forehead, ears, or upper teeth. Sinus pain can be mild or very bad. What are the causes? Sinus pain can result from conditions that affect your sinuses. Common causes include: Colds. Sinus infections. Allergies. What are the signs or symptoms? The main symptom of this condition is pain or pressure in your face, forehead, ears, or upper teeth. People who have sinus pain often have other symptoms, such as: Stuffed up or runny nose. Fever. Not being able to smell. Headache. Weather changes can make your symptoms worse. How is this treated? Treatment for this condition depends on the cause. Sinus pain caused by: A sinus infection may be treated with antibiotic medicine. A stuffy nose may be helped by rinsing out the nose and sinuses with a salt water solution. Allergies may be helped by allergy medicines and nasal sprays. Surgery may be needed in some cases if other treatments do not help. Follow these instructions at home: General instructions If told: Apply a warm, moist washcloth to your face. This can help to lessen pain. Use a nasal salt water wash. Follow the directions on the bottle or box. Hydrate and humidify Drink enough water to keep your pee (urine) pale yellow. Use a humidifier if your home is dry. Breathe in steam for 10-15 minutes, 3-4 times a day or as told by your doctor. You can do this in the bathroom while a hot shower is running. Try not to spend time in cool or dry air. Medicines  Take over-the-counter and prescription medicines only as told by your doctor. If you were prescribed an antibiotic medicine, take it as told by your doctor. Do not stop taking it even if you start to feel better. Use a nose spray if your nose feels full of mucus (congested). Contact a doctor if: You  get more than one headache a week. Light or sound bothers you. You have a fever. You feel sick to your stomach (nauseous) or you vomit. Your headaches do not get better with treatment. Get help right away if: You have trouble seeing. You suddenly have very bad pain in your face or head. You start to have quick, sudden movements or shaking that you cannot control (seizure). You are confused. You have a stiff neck. Summary Sinus pain happens when your sinuses get swollen or blocked (clogged). Sinuses are spaces behind the bones of your face and forehead. You may feel pain or pressure in your face, forehead, ears, or upper teeth. Take over-the-counter and prescription medicines only as told by your doctor. If told, apply a warm, moist washcloth to your face. This can help to lessen pain. This information is not intended to replace advice given to you by your health care provider. Make sure you discuss any questions you have with your health care provider. Document Revised: 05/06/2021 Document Reviewed: 05/06/2021 Elsevier Patient Education  [place].

## 2022-02-27 DIAGNOSIS — H722X1 Other marginal perforations of tympanic membrane, right ear: Secondary | ICD-10-CM | POA: Diagnosis not present

## 2022-02-27 DIAGNOSIS — H903 Sensorineural hearing loss, bilateral: Secondary | ICD-10-CM | POA: Diagnosis not present

## 2022-02-27 DIAGNOSIS — R2689 Other abnormalities of gait and mobility: Secondary | ICD-10-CM | POA: Diagnosis not present

## 2022-03-25 DIAGNOSIS — H722X1 Other marginal perforations of tympanic membrane, right ear: Secondary | ICD-10-CM | POA: Diagnosis not present

## 2022-03-25 DIAGNOSIS — H9311 Tinnitus, right ear: Secondary | ICD-10-CM | POA: Diagnosis not present

## 2022-03-26 DIAGNOSIS — H90A22 Sensorineural hearing loss, unilateral, left ear, with restricted hearing on the contralateral side: Secondary | ICD-10-CM | POA: Diagnosis not present

## 2022-03-26 DIAGNOSIS — H90A31 Mixed conductive and sensorineural hearing loss, unilateral, right ear with restricted hearing on the contralateral side: Secondary | ICD-10-CM | POA: Diagnosis not present

## 2022-03-27 ENCOUNTER — Ambulatory Visit: Payer: Medicare Other

## 2022-03-27 ENCOUNTER — Encounter: Payer: Self-pay | Admitting: Nurse Practitioner

## 2022-03-27 ENCOUNTER — Ambulatory Visit (INDEPENDENT_AMBULATORY_CARE_PROVIDER_SITE_OTHER): Payer: Medicare Other | Admitting: Nurse Practitioner

## 2022-03-27 ENCOUNTER — Telehealth: Payer: Self-pay | Admitting: Family Medicine

## 2022-03-27 VITALS — BP 111/71 | HR 66 | Temp 98.5°F | Ht 62.0 in | Wt 125.0 lb

## 2022-03-27 DIAGNOSIS — H9201 Otalgia, right ear: Secondary | ICD-10-CM

## 2022-03-27 DIAGNOSIS — L219 Seborrheic dermatitis, unspecified: Secondary | ICD-10-CM | POA: Diagnosis not present

## 2022-03-27 DIAGNOSIS — L639 Alopecia areata, unspecified: Secondary | ICD-10-CM | POA: Diagnosis not present

## 2022-03-27 DIAGNOSIS — R58 Hemorrhage, not elsewhere classified: Secondary | ICD-10-CM | POA: Diagnosis not present

## 2022-03-27 MED ORDER — PREDNISONE 10 MG (21) PO TBPK: ORAL_TABLET | ORAL | 0 refills | Status: DC

## 2022-03-27 MED ORDER — AMOXICILLIN-POT CLAVULANATE 875-125 MG PO TABS: 1.0000 | ORAL_TABLET | Freq: Two times a day (BID) | ORAL | 0 refills | Status: DC

## 2022-03-27 NOTE — Progress Notes (Signed)
Acute Office Visit  Subjective:     Patient ID: [name], female    DOB: 06/01/1956, 66 y.o.   MRN: [id number]  Chief Complaint  Patient presents with   Ear Pain    For a few days    Headache    Otalgia  There is pain in the right ear. This is a recurrent problem. The problem has been unchanged. There has been no fever. The pain is moderate. Pertinent negatives include no coughing, headaches, hearing loss, neck pain or rash. She has tried acetaminophen for the symptoms. The treatment provided no relief.     Review of Systems  Constitutional: Negative.   HENT:  Positive for ear pain. Negative for hearing loss.   Respiratory:  Negative for cough.   Musculoskeletal:  Negative for neck pain.  Skin: Negative.  Negative for itching and rash.  Neurological:  Negative for headaches.  All other systems reviewed and are negative.       Objective:    BP 111/71   Pulse 66   Temp 98.5 F (36.9 C)   Ht '5\' 2"'$  (1.575 m)   Wt 125 lb (56.7 kg)   SpO2 97%   BMI 22.86 kg/m  BP Readings from Last 3 Encounters:  03/27/22 111/71  02/20/22 109/76  11/15/21 120/76      Physical Exam Vitals and nursing note reviewed.  Constitutional:      Appearance: She is well-developed.  HENT:     Head: Normocephalic.     Right Ear: Hearing normal. Drainage and tenderness present. No swelling. There is no impacted cerumen. No foreign body. Tympanic membrane is not perforated.     Ears:     Comments: Right ear tenderness Eyes:     Pupils: Pupils are equal, round, and reactive to light.  Cardiovascular:     Rate and Rhythm: Normal rate and regular rhythm.     Heart sounds: Normal heart sounds.  Pulmonary:     Effort: Pulmonary effort is normal.     Breath sounds: Normal breath sounds.  Abdominal:     General: Bowel sounds are normal.     Palpations: Abdomen is soft.  Skin:    General: Skin is warm.  Neurological:     Mental Status: She is alert and oriented to person, place, and  time.  Psychiatric:        Mood and Affect: Mood normal.        Behavior: Behavior normal.     No results found for any visits on 03/27/22.      Assessment & Plan:  Patient presents with right ear tenderness and pain.  Recently evaluated by ENT.  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  Patient was supposed to follow-up with ENT with unresolved symptoms.  Patient presents today with the discomfort, tenderness. On assessment right ear is tender, swollen and erythematous.  Prednisone pack, amoxicillin 875-125 mg tablet by mouth Tylenol/ibuprofen for pain as needed Follow-up with worsening unresolved symptoms. Advised patient to return to ENT. Problem List Items Addressed This Visit   None Visit Diagnoses     Right ear pain    -  Primary   Relevant Medications   predniSONE (STERAPRED UNI-PAK 21 TAB) 10 MG (21) TBPK tablet   amoxicillin-clavulanate (AUGMENTIN) 875-125 MG tablet       Meds ordered this encounter  Medications   predniSONE (STERAPRED UNI-PAK 21 TAB) 10 MG (21) TBPK tablet  Sig: 6 tablets day 1, 5 tablet day 2, 4 tablet day 3, 3 tablet day 4, 2 tablet day 5, 1 tablet day 6    Dispense:  1 each    Refill:  0    Order Specific Question:   Supervising Provider    Answer:   Jeneen Rinks   amoxicillin-clavulanate (AUGMENTIN) 875-125 MG tablet    Sig: Take 1 tablet by mouth 2 (two) times daily.    Dispense:  10 tablet    Refill:  0    Order Specific Question:   Supervising Provider    Answer:   Claretta Fraise [phone]    Return if symptoms worsen or fail to improve.  Ivy Lynn, NP

## 2022-03-27 NOTE — Telephone Encounter (Signed)
Medication sent to pharmacy  

## 2022-03-27 NOTE — Patient Instructions (Signed)
Eardrum Rupture, Adult  An eardrum rupture is a hole (perforation) in the eardrum. The eardrum is a thin, round tissue inside of the ear that separates the ear canal from the middle ear. The eardrum is also called the tympanic membrane. It transfers sound vibrations through small bones in the middle ear to the hearing nerve in the inner ear. It also protects the middle ear from germs. An eardrum rupture can cause pain and hearing loss. What are the causes? This condition may be caused by: An infection. A sudden injury, such as from: Inserting a thin, sharp object into the ear. A hit to the side of the head, especially by an open hand. Falling onto water or a flat surface. A rapid change in pressure, such as from flying or scuba diving. A sudden increase in pressure against the eardrum, such as from an explosion or a very loud noise. Inserting a cotton-tipped swab in the ear. A long-term eustachian tube disorder. Eustachian tubes are parts of the body that connect each middle ear space to the back of the nose. A medical procedure or surgery, such as a procedure to remove wax from the ear canal. Removing a pressure equalization tube(PE tube) that was surgically placed through the eardrum. Having a PE tube fall out. What increases the risk? You are more likely to develop this condition if: You have had PE tubes inserted in your ears. You have an ear infection. You play sports that: Involve balls or contact with other players. Take place in water, such as diving, scuba diving, or waterskiing. What are the signs or symptoms? Symptoms of this condition include: Sudden pain at the time of the injury. Ear pain that suddenly improves. Ringing in the ear after the injury. Drainage from the ear. The drainage may be clear, cloudy or pus-like, or bloody. Hearing loss. Dizziness. How is this diagnosed? This condition is diagnosed based on your symptoms and medical history as well as a physical  exam. Your health care provider can usually see a perforation using an ear scope (otoscope). You may have tests, such as: A hearing test (audiogram) to check for hearing loss. A test in which a sample of ear drainage is tested for infection (culture). How is this treated? An eardrum typically heals on its own within a few weeks. If your eardrum does not heal, your health care provider may recommend a procedure to place a patch over your eardrum or surgery to repair your eardrum. Your health care provider may also prescribe antibiotic medicines to help prevent infection. If the ear heals completely, any hearing loss should be temporary. Follow these instructions at home: Medicines Take over-the-counter and prescription medicines only as told by your health care provider. If you were prescribed an antibiotic medicine, use it as told by your health care provider. Do not stop using the antibiotic even if you start to feel better. Ear care Keep your ear dry. This is very important. Follow instructions from your health care provider about how to keep your ear dry. You may need to wear waterproof earplugs when bathing and swimming. If directed, apply heat to your affected ear as often as told by your health care provider. Use the heat source that your health care provider recommends, such as a moist heat pack or a heating pad. This will help to relieve pain. Place a towel between your skin and the heat source. Leave the heat on for 20-30 minutes. Remove the heat if your skin turns bright red.   This is especially important if you are unable to feel pain, heat, or cold. You have a greater risk of getting burned. General instructions Return to sports and activities as told by your health care provider. Ask your health care provider what activities are safe for you. Wear headgear with ear protection when you play sports in which ear injuries are common. Talk to your health care provider before traveling by  plane. Keep all follow-up visits. This is important. Contact a health care provider if: You have a fever. You have ear pain. You have mucus or blood draining from your ear. You have hearing loss, dizziness, or ringing in your ear. Get help right away if: You have sudden hearing loss. You are very dizzy. You have severe ear pain. Your face feels weak or becomes limp (paralyzed). These symptoms may represent a serious problem that is an emergency. Do not wait to see if the symptoms will go away. Get medical help right away. Call your local emergency services (911 in the [place] Do not drive yourself to the hospital. Summary An eardrum rupture is a hole (perforation) in the eardrum that can cause pain and hearing loss. It is usually caused by a sudden injury to the ear. The eardrum will likely heal on its own within a few weeks. In some cases, surgery may be necessary. Follow instructions from your health care provider about how to keep your ear dry as it heals. This information is not intended to replace advice given to you by your health care provider. Make sure you discuss any questions you have with your health care provider. Document Revised: 04/24/2020 Document Reviewed: 04/24/2020 Elsevier Patient Education  2023 [employer]  

## 2022-04-02 ENCOUNTER — Telehealth: Payer: Self-pay | Admitting: Family Medicine

## 2022-04-02 ENCOUNTER — Telehealth: Payer: Self-pay

## 2022-04-02 NOTE — Telephone Encounter (Signed)
Called and spoke with pt , advised her of providers response

## 2022-04-02 NOTE — Telephone Encounter (Signed)
She can make appointment to follow up if she is having problems with her ear, like pain, infection, drainage etc, I do not have the equipment to look in her ear drums like the ENT to determine if her ear drums are healing appropriately. But I am most willing to  treat her symptoms. thanks

## 2022-04-02 NOTE — Telephone Encounter (Signed)
Patient would like to speak to [facility]. Said she came in to see her on 10/11 and was told that she has a hole in her eardrum. She went to the ENT and they told her that she would need to wait 2-3 months to see if it would heal on it's on. The patient wants to know if she would be able to come in to see Je throughout this time to check on the progress of healing. Please call back and advise.

## 2022-04-03 NOTE — Telephone Encounter (Signed)
Pt has been notified.

## 2022-04-15 ENCOUNTER — Encounter (HOSPITAL_COMMUNITY): Payer: Self-pay

## 2022-04-15 ENCOUNTER — Emergency Department (HOSPITAL_COMMUNITY)
Admission: EM | Admit: 2022-04-15 | Discharge: 2022-04-15 | Disposition: A | Discharge status: Home / Self Care | Payer: Medicare Other | Attending: Emergency Medicine | Admitting: Emergency Medicine

## 2022-04-15 ENCOUNTER — Emergency Department (HOSPITAL_COMMUNITY): Payer: Medicare Other

## 2022-04-15 ENCOUNTER — Other Ambulatory Visit: Payer: Self-pay

## 2022-04-15 DIAGNOSIS — Z8541 Personal history of malignant neoplasm of cervix uteri: Secondary | ICD-10-CM | POA: Insufficient documentation

## 2022-04-15 DIAGNOSIS — F419 Anxiety disorder, unspecified: Secondary | ICD-10-CM | POA: Insufficient documentation

## 2022-04-15 DIAGNOSIS — K3189 Other diseases of stomach and duodenum: Secondary | ICD-10-CM | POA: Diagnosis not present

## 2022-04-15 DIAGNOSIS — K6289 Other specified diseases of anus and rectum: Secondary | ICD-10-CM | POA: Diagnosis not present

## 2022-04-15 DIAGNOSIS — N12 Tubulo-interstitial nephritis, not specified as acute or chronic: Secondary | ICD-10-CM

## 2022-04-15 DIAGNOSIS — N1330 Unspecified hydronephrosis: Secondary | ICD-10-CM | POA: Diagnosis not present

## 2022-04-15 DIAGNOSIS — R8289 Other abnormal findings on cytological and histological examination of urine: Secondary | ICD-10-CM | POA: Diagnosis not present

## 2022-04-15 DIAGNOSIS — R350 Frequency of micturition: Secondary | ICD-10-CM | POA: Insufficient documentation

## 2022-04-15 DIAGNOSIS — R109 Unspecified abdominal pain: Secondary | ICD-10-CM | POA: Insufficient documentation

## 2022-04-15 DIAGNOSIS — N3289 Other specified disorders of bladder: Secondary | ICD-10-CM | POA: Diagnosis not present

## 2022-04-15 LAB — URINALYSIS, ROUTINE W REFLEX MICROSCOPIC
Bilirubin Urine: NEGATIVE
Glucose, UA: 50 mg/dL — AB
Ketones, ur: NEGATIVE mg/dL
Nitrite: POSITIVE — AB
Protein, ur: 100 mg/dL — AB
Specific Gravity, Urine: 1.009 (ref 1.005–1.030)
WBC, UA: >50 WBC/hpf — ABNORMAL HIGH (ref 0–5)
pH: 6 (ref 5.0–8.0)

## 2022-04-15 MED ORDER — ACETAMINOPHEN 325 MG PO TABS
650.0000 mg | ORAL_TABLET | Freq: Once | ORAL | Status: AC
  Administered 2022-04-15: 650 mg via ORAL
  Filled 2022-04-15: qty 2

## 2022-04-15 MED ORDER — CIPROFLOXACIN HCL 250 MG PO TABS
500.0000 mg | ORAL_TABLET | Freq: Once | ORAL | Status: AC
  Administered 2022-04-15: 500 mg via ORAL
  Filled 2022-04-15: qty 2

## 2022-04-15 MED ORDER — ONDANSETRON 4 MG PO TBDP: 4.0000 mg | ORAL_TABLET | Freq: Three times a day (TID) | ORAL | 0 refills | Status: DC | PRN

## 2022-04-15 MED ORDER — HYDROCODONE-ACETAMINOPHEN 5-325 MG PO TABS
1.0000 | ORAL_TABLET | Freq: Once | ORAL | Status: AC
  Administered 2022-04-15: 1 via ORAL
  Filled 2022-04-15: qty 1

## 2022-04-15 MED ORDER — CIPROFLOXACIN HCL 500 MG PO TABS: 500.0000 mg | ORAL_TABLET | Freq: Two times a day (BID) | ORAL | 0 refills | Status: DC

## 2022-04-15 NOTE — Discharge Instructions (Addendum)
Please take antibiotics as prescribed.  Please follow-up with your primary care doctor.  Make sure you are drinking plenty of water.  Use Zofran as needed for nausea.  Return to the ER for any new or concerning symptoms.

## 2022-04-15 NOTE — ED Provider Notes (Signed)
[facility] EMERGENCY DEPARTMENT Provider Note   CSN: [id number] Arrival date & time: 04/15/22  1248     History  Chief Complaint  Patient presents with   Flank Pain    [name] is a 66 y.o. female.   Flank Pain  Patient is a 66 year old female with past medical history significant for vertigo and cervical cancer  She is presented emergency room today with complaints of some urinary frequency and left flank pain that began this morning.  She denies any burning when she urinates.  She states that she often does not have the symptom of her urinary tract infections.  She denies any vaginal pain or discharge.  She denies any chest pain or difficulty breathing.  No lightheadedness or dizziness.  She feels that her symptoms are consistent with prior UTIs.  She denies any fevers at home.  States that she is making urine consistently.       Home Medications Prior to Admission medications   Medication Sig Start Date End Date Taking? Authorizing Provider  ciprofloxacin (CIPRO) 500 MG tablet Take 1 tablet (500 mg total) by mouth every 12 (twelve) hours. 04/15/22  Yes Marilou Barnfield S, PA  ondansetron (ZOFRAN-ODT) 4 MG disintegrating tablet Take 1 tablet (4 mg total) by mouth every 8 (eight) hours as needed for nausea or vomiting. 04/15/22  Yes Marg Macmaster S, PA  amoxicillin-clavulanate (AUGMENTIN) 875-125 MG tablet Take 1 tablet by mouth 2 (two) times daily. 03/27/22   Ivy Lynn, NP  levothyroxine (SYNTHROID) 25 MCG tablet Take 1 tablet (25 mcg total) by mouth daily. 06/04/21   Hassell Done, Mary-Margaret, FNP  mirtazapine (REMERON) 7.5 MG tablet Take 1 tablet (7.5 mg total) by mouth at bedtime. To help with anxiety/ appetite 07/10/21   Ronnie Doss M, DO  phenazopyridine (PYRIDIUM) 95 MG tablet Take 1 tablet (95 mg total) by mouth 3 (three) times daily as needed for pain. 09/19/21   Ivy Lynn, NP  predniSONE (STERAPRED UNI-PAK 21 TAB) 10 MG (21) TBPK tablet 6 tablets  day 1, 5 tablet day 2, 4 tablet day 3, 3 tablet day 4, 2 tablet day 5, 1 tablet day 6 03/27/22   Ivy Lynn, NP  simvastatin (ZOCOR) 20 MG tablet Take 1 tablet (20 mg total) by mouth daily at 6 PM. 07/10/21   Ronnie Doss M, DO      Allergies    Barium-containing compounds and Percocet [oxycodone-acetaminophen]    Review of Systems   Review of Systems  Genitourinary:  Positive for flank pain.    Physical Exam Updated Vital Signs BP 120/69 (BP Location: Right Arm)   Pulse 66   Temp 97.8 F (36.6 C) (Oral)   Resp 18   Ht '5\' 2"'$  (1.575 m)   Wt 54.9 kg   SpO2 98%   BMI 22.13 kg/m  Physical Exam Vitals and nursing note reviewed.  Constitutional:      General: She is not in acute distress.    Comments: Pleasant well-appearing 66 year old.  In no acute distress.  Sitting comfortably in bed.  Able answer questions appropriately follow commands. No increased work of breathing. Speaking in full sentences.   HENT:     Head: Normocephalic and atraumatic.     Nose: Nose normal.  Eyes:     General: No scleral icterus. Cardiovascular:     Rate and Rhythm: Normal rate and regular rhythm.     Pulses: Normal pulses.     Heart sounds: Normal heart sounds.  Pulmonary:     Effort: Pulmonary effort is normal. No respiratory distress.     Breath sounds: No wheezing.  Abdominal:     Palpations: Abdomen is soft.     Tenderness: There is no abdominal tenderness. There is no right CVA tenderness, left CVA tenderness, guarding or rebound.  Musculoskeletal:     Cervical back: Normal range of motion.     Right lower leg: No edema.     Left lower leg: No edema.  Skin:    General: Skin is warm and dry.     Capillary Refill: Capillary refill takes less than 2 seconds.  Neurological:     Mental Status: She is alert. Mental status is at baseline.  Psychiatric:        Mood and Affect: Mood normal.        Behavior: Behavior normal.     ED Results / Procedures / Treatments   Labs (all  labs ordered are listed, but only abnormal results are displayed) Labs Reviewed  URINALYSIS, ROUTINE W REFLEX MICROSCOPIC - Abnormal; Notable for the following components:      Result Value   APPearance HAZY (*)    Glucose, UA 50 (*)    Hgb urine dipstick MODERATE (*)    Protein, ur 100 (*)    Nitrite POSITIVE (*)    Leukocytes,Ua LARGE (*)    WBC, UA >50 (*)    Bacteria, UA RARE (*)    All other components within normal limits  URINE CULTURE    EKG None  Radiology No results found.  Procedures Procedures    Medications Ordered in ED Medications  ciprofloxacin (CIPRO) tablet 500 mg (500 mg Oral Given 04/15/22 1726)  HYDROcodone-acetaminophen (NORCO/VICODIN) 5-325 MG per tablet 1 tablet (1 tablet Oral Given 04/15/22 1727)  acetaminophen (TYLENOL) tablet 650 mg (650 mg Oral Given 04/15/22 1726)    ED Course/ Medical Decision Making/ A&P                           Medical Decision Making Amount and/or Complexity of Data Reviewed Labs: ordered. Radiology: ordered.  Risk OTC drugs. Prescription drug management.   This patient presents to the ED for concern of flank pain, this involves a number of treatment options, and is a complaint that carries with it a moderate to high risk of complications and morbidity. A differential diagnosis was considered for the patient's symptoms which is discussed below:   The differential diagnosis of emergent flank pain includes, but is not limited to :Abdominal aortic aneurysm,, Renal artery embolism,Renal vein thrombosis, Aortic dissection, Mesenteric ischemia, Pyelonephritis, Renal infarction, Renal hemorrhage, Nephrolithiasis/ Renal Colic, Bladder tumor,Cystitis, Biliary colic, Pancreatitis Perforated peptic ulcer Appendicitis ,Inguinal Hernia, Diverticulitis, Bowel obstruction Ectopic Pregnancy,PID/TOA,Ovarian cyst, Ovarian torsion, Shingles Lower lobe pneumonia, Retroperitoneal hematoma/abscess/tumor, Epidural abscess, Epidural  hematoma    Co morbidities: Discussed in HPI   Brief History:  Patient is a 66 year old female with past medical history significant for vertigo and cervical cancer  She is presented emergency room today with complaints of some urinary frequency and left flank pain that began this morning.  She denies any burning when she urinates.  She states that she often does not have the symptom of her urinary tract infections.  She denies any vaginal pain or discharge.  She denies any chest pain or difficulty breathing.  No lightheadedness or dizziness.  She feels that her symptoms are consistent with prior UTIs.  She denies any fevers  at home.  States that she is making urine consistently.    EMR reviewed including pt PMHx, past surgical history and past visits to ER.   See HPI for more details   Lab Tests:   I ordered and independently interpreted labs. Labs notable for Urinalysis with bacteria greater than 50 WBC leukocytes nitrates and blood/hemoglobin present consistent with UTI/pyelonephritis given the flank pain.  Urine culture sent  I had a shared decision making epilation with patient about additional work-up.  She feels that her symptoms are classic for her usual UTIs and feels well.  She is requesting discharge home and seems quite anxious to be discharged.  Imaging Studies:  Abnormal findings. I personally reviewed all imaging studies. Imaging notable for  IMPRESSION:  1. Moderate left hydroureteronephrosis to the level of the pelvic  brim, similar to prior exam. This appears to be chronic with  thinning of the left renal parenchyma. Mild left perinephric edema  can be seen in the setting of urinary tract infection. No  urolithiasis.  2. The right hydronephrosis on prior exam has resolved.  3. Mild bladder wall thickening.  4. Chronic rectal wall thickening. Chronic presacral soft tissue  thickening and calcifications.  5. Moderate volume of formed stool throughout the  colon, suggesting  constipation. The sigmoid colon is poorly defined, possible chronic  wall thickening.    Aortic Atherosclerosis (ICD10-I70.0).      Electronically Signed    By: Keith Rake M.D.    On: 04/15/2022 17:41     No acute obstructive urinary stones.  Some evidence of constipation.  Cardiac Monitoring:  NA NA   Medicines ordered:  I ordered medication including Norco, Tylenol, ciprofloxacin for pain and UTI Reevaluation of the patient after these medicines showed that the patient improved I have reviewed the patients home medicines and have made adjustments as needed   Critical Interventions:     Consults/Attending Physician      Reevaluation:  After the interventions noted above I re-evaluated patient and found that they have :improved   Social Determinants of Health:      Problem List / ED Course:  Flank pain.  Patient feels strongly that this is normal UTI presentation for her.  She states that she is has ciprofloxacin with good outcomes in the past.  She denies any fevers, nausea, vomiting.  Will send home with Cipro give first dose here and discharged with Zofran prescription in case needed.  Very strict return precautions provided.   Dispostion:  After consideration of the diagnostic results and the patients response to treatment, I feel that the patent would benefit from close outpatient follow-up.  Very strict return precautions given.    Final Clinical Impression(s) / ED Diagnoses Final diagnoses:  Flank pain    Rx / DC Orders ED Discharge Orders          Ordered    ciprofloxacin (CIPRO) 500 MG tablet  Every 12 hours        04/15/22 1719    ondansetron (ZOFRAN-ODT) 4 MG disintegrating tablet  Every 8 hours PRN        04/15/22 1721              Tedd Sias, [place] 04/15/22 1854    Godfrey Pick, MD 04/16/22 2108

## 2022-04-15 NOTE — ED Triage Notes (Signed)
Pt presents to ED with complaints of left flank pain started this am. Denies urinary symptoms

## 2022-04-17 LAB — URINE CULTURE

## 2022-05-07 ENCOUNTER — Ambulatory Visit (INDEPENDENT_AMBULATORY_CARE_PROVIDER_SITE_OTHER): Payer: Medicare Other | Admitting: Nurse Practitioner

## 2022-05-07 ENCOUNTER — Encounter: Payer: Self-pay | Admitting: Nurse Practitioner

## 2022-05-07 ENCOUNTER — Telehealth: Payer: Self-pay | Admitting: Family Medicine

## 2022-05-07 VITALS — BP 121/78 | HR 64 | Temp 98.7°F | Ht 62.0 in | Wt 124.0 lb

## 2022-05-07 DIAGNOSIS — H9201 Otalgia, right ear: Secondary | ICD-10-CM | POA: Diagnosis not present

## 2022-05-07 DIAGNOSIS — R42 Dizziness and giddiness: Secondary | ICD-10-CM

## 2022-05-07 DIAGNOSIS — M542 Cervicalgia: Secondary | ICD-10-CM | POA: Diagnosis not present

## 2022-05-07 MED ORDER — IBUPROFEN 600 MG PO TABS: 600.0000 mg | ORAL_TABLET | Freq: Three times a day (TID) | ORAL | 0 refills | Status: DC | PRN

## 2022-05-07 MED ORDER — MECLIZINE HCL 12.5 MG PO TABS: 12.5000 mg | ORAL_TABLET | Freq: Three times a day (TID) | ORAL | 0 refills | Status: DC | PRN

## 2022-05-07 MED ORDER — PREDNISONE 20 MG PO TABS: 20.0000 mg | ORAL_TABLET | Freq: Every day | ORAL | 0 refills | Status: DC

## 2022-05-07 NOTE — Progress Notes (Signed)
Acute Office Visit  Subjective:     Patient ID: [name], female    DOB: [birth year], 66 y.o.   MRN: [id number]  Chief Complaint  Patient presents with   Ear Pain    Right - pt states she has had a hole in her ear and has been having issues since     Otalgia  There is pain in the right ear. This is a chronic problem. The current episode started more than 1 month ago. The problem occurs constantly. The problem has been unchanged. There has been no fever. Associated symptoms include neck pain. Pertinent negatives include no rash. She has tried antibiotics, ear drops and acetaminophen for the symptoms. The treatment provided no relief. There is no history of hearing loss.  Neck Pain  This is a new problem. The current episode started in the past 7 days. The problem has been unchanged. The pain is associated with a sleep position. The quality of the pain is described as aching. The pain is at a severity of 5/10. The pain is moderate. Nothing aggravates the symptoms. Pertinent negatives include no fever. She has tried acetaminophen for the symptoms. The treatment provided no relief.  Dizziness This is a recurrent problem. The current episode started more than 1 month ago. The problem occurs intermittently. The problem has been unchanged. Associated symptoms include neck pain. Pertinent negatives include no chills, fever or rash. The symptoms are aggravated by bending. She has tried position changes for the symptoms.    Review of Systems  Constitutional: Negative.  Negative for chills and fever.  HENT:  Positive for ear pain.   Eyes: Negative.   Respiratory: Negative.    Cardiovascular: Negative.   Genitourinary: Negative.   Musculoskeletal:  Positive for neck pain.  Skin: Negative.  Negative for rash.  Neurological:  Positive for dizziness.  All other systems reviewed and are negative.       Objective:    BP 121/78   Pulse 64   Temp 98.7 F (37.1 C)   Ht '5\' 2"'$  (1.575 m)    Wt 124 lb (56.2 kg)   SpO2 97%   BMI 22.68 kg/m  BP Readings from Last 3 Encounters:  05/07/22 121/78  04/15/22 120/69  03/27/22 111/71   Wt Readings from Last 3 Encounters:  05/07/22 124 lb (56.2 kg)  04/15/22 121 lb (54.9 kg)  03/27/22 125 lb (56.7 kg)      Physical Exam Vitals and nursing note reviewed.  Constitutional:      Appearance: Normal appearance.  HENT:     Head: Normocephalic.     Right Ear: External ear normal. Tenderness present. There is no impacted cerumen. No foreign body. Tympanic membrane is erythematous.     Left Ear: External ear normal.     Ears:     Comments: Pain and tenderness right ear    Nose: Nose normal. No congestion.     Mouth/Throat:     Mouth: Mucous membranes are moist.     Pharynx: Oropharynx is clear.  Eyes:     Pupils: Pupils are equal, round, and reactive to light.  Cardiovascular:     Pulses: Normal pulses.     Heart sounds: Normal heart sounds.  Pulmonary:     Effort: Pulmonary effort is normal.     Breath sounds: Normal breath sounds.  Abdominal:     General: Bowel sounds are normal.  Skin:    General: Skin is warm.  Neurological:  General: No focal deficit present.     Mental Status: She is alert and oriented to person, place, and time.     No results found for any visits on 05/07/22.      Assessment & Plan:  Patient was seen for right ear pain by ENT October 9 and was assessed for ear drainage perforation.  Pain is not well controlled.  Patient is to return in January for reassessment and if symptoms are not resolved patient will be scheduled for surgery.  Advised patient to keep objects/cotton but out of the canal, ibuprofen 600 mg tablet by mouth for pain.  Prednisone 20 mg tablet by mouth daily.  Follow-up with worsening or unresolved symptoms.   For neck pain advised patient to adjust sleeping position, and pillow.  500 mg Robaxin as needed for musculoskeletal pain.  Advised patient to follow-up with unresolved  symptoms.  Patient is also reporting balance issues in the past few days.  Advised patient to increase hydration, we discussed smoking cessation.  Meclizine 12.5 mg tablet by mouth as needed.  Follow-up with worsening unresolved symptoms. Problem List Items Addressed This Visit       Other   Neck pain   Relevant Medications   ibuprofen (ADVIL) 600 MG tablet   Right ear pain - Primary   Relevant Medications   predniSONE (DELTASONE) 20 MG tablet   Other Visit Diagnoses     Dizziness       Relevant Medications   meclizine (ANTIVERT) 12.5 MG tablet       Meds ordered this encounter  Medications   meclizine (ANTIVERT) 12.5 MG tablet    Sig: Take 1 tablet (12.5 mg total) by mouth 3 (three) times daily as needed for dizziness.    Dispense:  30 tablet    Refill:  0    Order Specific Question:   Supervising Provider    Answer:   Janora Norlander [[id number]]   predniSONE (DELTASONE) 20 MG tablet    Sig: Take 1 tablet (20 mg total) by mouth daily with breakfast.    Dispense:  6 tablet    Refill:  0    Order Specific Question:   Supervising Provider    Answer:   Janora Norlander [[id number]]   ibuprofen (ADVIL) 600 MG tablet    Sig: Take 1 tablet (600 mg total) by mouth every 8 (eight) hours as needed.    Dispense:  30 tablet    Refill:  0    Order Specific Question:   Supervising Provider    Answer:   Janora Norlander [[id number]]    Return if symptoms worsen or fail to improve, for ear pain .  Ivy Lynn, NP

## 2022-05-07 NOTE — Patient Instructions (Signed)
Earache, Adult An earache, or ear pain, can be caused by many things, including: An infection. Ear wax buildup. Ear pressure. Something in the ear that should not be there (foreign body). A sore throat. Tooth problems. Jaw problems. Treatment of the earache will depend on the cause. If the cause is not clear or cannot be known, you may need to watch your symptoms until your earache goes away or until a cause is found. Follow these instructions at home: Medicines Take or apply over-the-counter and prescription medicines only as told by your health care provider. If you were prescribed antibiotics, use them as told by your health care provider. Do not stop using the antibiotic even if you start to feel better. Do not put anything in your ear other than medicine that is prescribed by your health care provider. Managing pain     If directed, apply heat to the affected area as often as told by your health care provider. Use the heat source that your health care provider recommends, such as a moist heat pack or a heating pad. Place a towel between your skin and the heat source. Leave the heat on for 20-30 minutes. If your skin turns bright red, remove the heat right away to prevent burns. The risk of burns is higher if you cannot feel pain, heat, or cold. If directed, put ice on the affected area. To do this: Put ice in a plastic bag. Place a towel between your skin and the bag. Leave the ice on for 20 minutes, 2-3 times a day. If your skin turns bright red, remove the ice right away to prevent skin damage. The risk of skin damage is higher if you cannot feel pain, heat, or cold.  General instructions Pay attention to any changes in your symptoms. Try resting in an upright position instead of lying down. This may help to reduce pressure in your ear and relieve pain. Chew gum if it helps to relieve your ear pain. Treat any allergies as told by your health care provider. Drink enough fluid  to keep your urine pale yellow. It is up to you to get the results of any tests that were done. Ask your health care provider, or the department that is doing the tests, when your results will be ready. Contact a health care provider if: Your pain does not improve within 2 days. Your earache gets worse. You have new symptoms. You have a fever. Get help right away if: You have a severe headache. You have a stiff neck. You have trouble swallowing. You have redness or swelling behind your ear. You have fluid or blood coming from your ear. You have hearing loss. You feel dizzy. This information is not intended to replace advice given to you by your health care provider. Make sure you discuss any questions you have with your health care provider. Document Revised: 10/15/2021 Document Reviewed: 10/15/2021 Elsevier Patient Education  [employer].

## 2022-05-07 NOTE — Telephone Encounter (Signed)
Very unfortunate to hear this.  If she is being verbally abusive towards staff, this is not tolerable.  I do understand her frustration re: not being able to get seen by PCP.  She unfortunately has many acute issues that have been managed by the acute provider.  During her January appt, it was recommended she follow up in July for interval check up and she unfortunately did not make this appt.  While, I do my best to double book my patients in, this is not often possible if the schedule is totally full.  Possible solutions to her concerns: schedule appropriate interval visits as recommended (perhaps even on an every 3 month basis "just in case" something is needed) vs switch to a provider in the office that is more readily available so that she has the continuity of care with the same provider that she desires.

## 2022-05-07 NOTE — Telephone Encounter (Signed)
Patient came in for her appt with an attitude. She complains about never being able to see the same provider and she complains about having to fill out the fall/depression screening. She is always rude to front office staff.

## 2022-05-16 ENCOUNTER — Other Ambulatory Visit: Payer: Self-pay | Admitting: Nurse Practitioner

## 2022-05-16 ENCOUNTER — Other Ambulatory Visit: Payer: Self-pay | Admitting: Family Medicine

## 2022-05-16 DIAGNOSIS — E039 Hypothyroidism, unspecified: Secondary | ICD-10-CM

## 2022-05-29 ENCOUNTER — Encounter: Payer: Self-pay | Admitting: Nurse Practitioner

## 2022-05-29 ENCOUNTER — Ambulatory Visit (INDEPENDENT_AMBULATORY_CARE_PROVIDER_SITE_OTHER): Payer: Medicare Other | Admitting: Nurse Practitioner

## 2022-05-29 VITALS — BP 118/75 | HR 60 | Temp 98.5°F | Ht 62.0 in | Wt 124.0 lb

## 2022-05-29 DIAGNOSIS — R399 Unspecified symptoms and signs involving the genitourinary system: Secondary | ICD-10-CM

## 2022-05-29 DIAGNOSIS — R300 Dysuria: Secondary | ICD-10-CM | POA: Diagnosis not present

## 2022-05-29 LAB — URINALYSIS, ROUTINE W REFLEX MICROSCOPIC
Bilirubin, UA: NEGATIVE
Glucose, UA: NEGATIVE
Ketones, UA: NEGATIVE
Nitrite, UA: NEGATIVE
Protein,UA: NEGATIVE
Specific Gravity, UA: 1.015 (ref 1.005–1.030)
Urobilinogen, Ur: 0.2 mg/dL (ref 0.2–1.0)
pH, UA: 6.5 (ref 5.0–7.5)

## 2022-05-29 LAB — MICROSCOPIC EXAMINATION
Bacteria, UA: NONE SEEN
Epithelial Cells (non renal): NONE SEEN /hpf (ref 0–10)
RBC, Urine: NONE SEEN /hpf (ref 0–2)
Renal Epithel, UA: NONE SEEN /hpf

## 2022-05-29 MED ORDER — CEFTRIAXONE SODIUM 1 G IJ SOLR
1.0000 g | Freq: Once | INTRAMUSCULAR | Status: AC
  Administered 2022-05-29: 1 g via INTRAMUSCULAR

## 2022-05-29 MED ORDER — CIPROFLOXACIN HCL 250 MG PO TABS: 250.0000 mg | ORAL_TABLET | Freq: Two times a day (BID) | ORAL | 0 refills | Status: AC

## 2022-05-29 NOTE — Patient Instructions (Signed)
Pyelonephritis, Adult Pyelonephritis is an infection that occurs in the kidney. The kidneys are the organs that filter a person's blood and move waste out of the bloodstream and into the urine. Urine passes from the kidneys, through tubes called ureters, and into the bladder. There are two main types of pyelonephritis: Infections that come on quickly without any warning (acute pyelonephritis). Infections that last for a long period of time (chronic pyelonephritis). In most cases, the infection clears up with treatment and does not cause further problems. More severe infections or chronic infections can sometimes spread to the bloodstream or lead to other problems with the kidneys. What are the causes? This condition is usually caused by: Bacteria traveling from the bladder up to the kidney. This may occur after having a bladder infection (cystitis) or urinary tract infection (UTI). Bladder infections caused from bacteria traveling from the bloodstream to the kidney. What increases the risk? This condition is more likely to develop in: Pregnant women. Older people. People who have any of these conditions: Diabetes. Inflammation of the prostate gland (prostatitis), in males. Kidney stones or bladder stones. Other abnormalities of the kidney or ureter. Cancer. People who have a catheter placed in the bladder. People who are sexually active. Women who use spermicides. People who have had a prior UTI. What are the signs or symptoms? Symptoms of this condition include: Frequent urination. Strong or persistent urge to urinate. Burning or stinging when urinating. Abdominal pain. Back pain. Pain in the side or flank area. Fever or chills. Blood in the urine, or dark urine. Nausea or vomiting. How is this diagnosed? This condition may be diagnosed based on: Your medical history and a physical exam. Urine tests. Blood tests. You may also have imaging tests of the kidneys, such as an  ultrasound or CT scan. How is this treated? Treatment for this condition may depend on the severity of the infection. If the infection is mild and is found early, you may be treated with antibiotic medicines taken by mouth (orally). You will need to drink fluids to remain hydrated. If the infection is more severe, you may need to stay in the hospital and receive antibiotics given directly into a vein through an IV. You may also need to receive fluids through an IV if you are not able to remain hydrated. After your hospital stay, you may need to take oral antibiotics for a period of time. Other treatments may be required, depending on the cause of the infection. Follow these instructions at home: Medicines Take your antibiotic medicine as told by your health care provider. Do not stop taking the antibiotic even if you start to feel better. Take over-the-counter and prescription medicines only as told by your health care provider. General instructions  Drink enough fluid to keep your urine pale yellow. Avoid caffeine, tea, and carbonated beverages. They tend to irritate the bladder. Urinate often. Avoid holding in urine for long periods of time. Urinate before and after sex. After a bowel movement, women should cleanse from front to back. Use each tissue only once. Keep all follow-up visits as told by your health care provider. This is important. Contact a health care provider if: Your symptoms do not get better after 2 days of treatment. Your symptoms get worse. You have a fever. Get help right away if you: Are unable to take your antibiotics or fluids. Have shaking chills. Vomit. Have severe flank or back pain. Have extreme weakness or fainting. Summary Pyelonephritis is a urinary tract infection (  UTI) that occurs in the kidney. Treatment for this condition may depend on the severity of the infection. Take your antibiotic medicine as told by your health care provider. Do not stop  taking the antibiotic even if you start to feel better. Drink enough fluid to keep your urine pale yellow. Keep all follow-up visits as told by your health care provider. This is important. This information is not intended to replace advice given to you by your health care provider. Make sure you discuss any questions you have with your health care provider. Document Revised: 01/11/2021 Document Reviewed: 01/11/2021 Elsevier Patient Education  2023 Elsevier Inc.  

## 2022-05-29 NOTE — Progress Notes (Signed)
Acute Office Visit  Subjective:     Patient ID: [name], female    DOB: 22-Nov-1955, 66 y.o.   MRN: [id number]  Chief Complaint  Patient presents with   posssible kidney infection    Dysuria  This is a new problem. The current episode started yesterday. The problem occurs every urination. The problem has been gradually worsening. The quality of the pain is described as aching. She is Not sexually active. There is No history of pyelonephritis. Associated symptoms include flank pain and frequency. Pertinent negatives include no chills or urgency. She has tried increased fluids for the symptoms. The treatment provided no relief. Her past medical history is significant for recurrent UTIs.     Review of Systems  Constitutional: Negative.  Negative for chills.  HENT: Negative.    Genitourinary:  Positive for dysuria, flank pain and frequency. Negative for urgency.  Skin: Negative.  Negative for itching and rash.  All other systems reviewed and are negative.       Objective:    BP 118/75   Pulse 60   Temp 98.5 F (36.9 C)   Ht '5\' 2"'$  (1.575 m)   Wt 124 lb (56.2 kg)   SpO2 96%   BMI 22.68 kg/m  BP Readings from Last 3 Encounters:  05/29/22 118/75  05/07/22 121/78  04/15/22 120/69   Wt Readings from Last 3 Encounters:  05/29/22 124 lb (56.2 kg)  05/07/22 124 lb (56.2 kg)  04/15/22 121 lb (54.9 kg)      Physical Exam Vitals and nursing note reviewed.  Constitutional:      Appearance: Normal appearance.  HENT:     Head: Normocephalic.     Right Ear: External ear normal.     Left Ear: External ear normal.     Nose: Nose normal.     Mouth/Throat:     Mouth: Mucous membranes are moist.     Pharynx: Oropharynx is clear.  Eyes:     Conjunctiva/sclera: Conjunctivae normal.  Cardiovascular:     Rate and Rhythm: Normal rate and regular rhythm.     Pulses: Normal pulses.     Heart sounds: Normal heart sounds.  Pulmonary:     Effort: Pulmonary effort is normal.      Breath sounds: Normal breath sounds.  Abdominal:     General: Bowel sounds are normal.     Tenderness: There is right CVA tenderness and left CVA tenderness.  Neurological:     Mental Status: She is alert and oriented to person, place, and time.     No results found for any visits on 05/29/22.      Assessment & Plan:  Patient presents with symptoms of dysuria. With CVS tenderness.gave rocephin shot left buttocks (IM).   UTI  vs  interstitial cystitis -urinalysis completed results pending -pyridium for pain Precaution and education provided -All questions answered -follow up with unresolved symptoms   Problem List Items Addressed This Visit       Other   Dysuria   Relevant Medications   ciprofloxacin (CIPRO) 250 MG tablet   Other Relevant Orders   Urinalysis, Routine w reflex microscopic   Urine Culture   Other Visit Diagnoses     UTI symptoms    -  Primary   Relevant Medications   cefTRIAXone (ROCEPHIN) injection 1 g (Completed)       Meds ordered this encounter  Medications   ciprofloxacin (CIPRO) 250 MG tablet    Sig: Take 1 tablet (  250 mg total) by mouth 2 (two) times daily for 3 days.    Dispense:  6 tablet    Refill:  0    Order Specific Question:   Supervising Provider    Answer:   Jeneen Rinks   cefTRIAXone (ROCEPHIN) injection 1 g    Return if symptoms worsen or fail to improve.  Ivy Lynn, NP

## 2022-05-31 LAB — URINE CULTURE

## 2022-06-15 ENCOUNTER — Other Ambulatory Visit: Payer: Self-pay | Admitting: Family Medicine

## 2022-06-15 DIAGNOSIS — E039 Hypothyroidism, unspecified: Secondary | ICD-10-CM

## 2022-06-18 NOTE — Telephone Encounter (Signed)
30 day supply sent in. Please schedule with PCP for labs and chronic follow up for further refills.

## 2022-06-18 NOTE — Telephone Encounter (Signed)
PT made appt for jan 31

## 2022-06-19 DIAGNOSIS — N1330 Unspecified hydronephrosis: Secondary | ICD-10-CM | POA: Diagnosis not present

## 2022-06-19 DIAGNOSIS — N3000 Acute cystitis without hematuria: Secondary | ICD-10-CM | POA: Diagnosis not present

## 2022-07-11 ENCOUNTER — Encounter: Payer: Self-pay | Admitting: Family Medicine

## 2022-07-17 ENCOUNTER — Encounter: Payer: Self-pay | Admitting: Family Medicine

## 2022-07-17 ENCOUNTER — Telehealth (INDEPENDENT_AMBULATORY_CARE_PROVIDER_SITE_OTHER): Payer: Medicare Other | Admitting: Family Medicine

## 2022-07-17 DIAGNOSIS — R630 Anorexia: Secondary | ICD-10-CM

## 2022-07-17 DIAGNOSIS — R5382 Chronic fatigue, unspecified: Secondary | ICD-10-CM | POA: Diagnosis not present

## 2022-07-17 DIAGNOSIS — F410 Panic disorder [episodic paroxysmal anxiety] without agoraphobia: Secondary | ICD-10-CM

## 2022-07-17 DIAGNOSIS — M792 Neuralgia and neuritis, unspecified: Secondary | ICD-10-CM

## 2022-07-17 DIAGNOSIS — E039 Hypothyroidism, unspecified: Secondary | ICD-10-CM | POA: Diagnosis not present

## 2022-07-17 DIAGNOSIS — N3945 Continuous leakage: Secondary | ICD-10-CM

## 2022-07-17 DIAGNOSIS — N1831 Chronic kidney disease, stage 3a: Secondary | ICD-10-CM | POA: Diagnosis not present

## 2022-07-17 DIAGNOSIS — E782 Mixed hyperlipidemia: Secondary | ICD-10-CM

## 2022-07-17 DIAGNOSIS — F418 Other specified anxiety disorders: Secondary | ICD-10-CM

## 2022-07-17 MED ORDER — LEVOTHYROXINE SODIUM 25 MCG PO TABS: 25.0000 ug | ORAL_TABLET | Freq: Every day | ORAL | 3 refills | Status: AC

## 2022-07-17 MED ORDER — SIMVASTATIN 20 MG PO TABS: 20.0000 mg | ORAL_TABLET | Freq: Every day | ORAL | 3 refills | Status: DC

## 2022-07-17 MED ORDER — MIRTAZAPINE 15 MG PO TABS: 15.0000 mg | ORAL_TABLET | Freq: Every day | ORAL | 3 refills | Status: DC

## 2022-07-17 MED ORDER — GABAPENTIN 300 MG PO CAPS: 300.0000 mg | ORAL_CAPSULE | Freq: Two times a day (BID) | ORAL | 1 refills | Status: DC

## 2022-07-17 NOTE — Progress Notes (Signed)
Phone visit  Subjective: [name] concerns PCP: Janora Norlander, DO DGU:YQIH [name] is a 67 y.o. female. Patient provides verbal consent for consult held via phone.  Due to COVID-19 pandemic this visit was conducted virtually. This visit type was conducted due to national recommendations for restrictions regarding the COVID-19 Pandemic (e.g. social distancing, sheltering in place) in an effort to limit this patient's exposure and mitigate transmission in our community. All issues noted in this document were discussed and addressed.  A physical exam was not performed with this format.   Location of patient: home Location of provider: [facility] Others present for call: spouse  1. 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  She went to ENT and they are waiting 2-3 months to determine appropriateness to surgically correct.  That was in October.  She has an upcoming appointment.  She reports humming in her ear and having sharp pain into her face. He didn't believe that her pain was coming from this ear perforation.  She reports being dissatisfied with progress so far  2. Sleep difficulties Mirtazapine 7.'5mg'$  was helpful for sleep in the beginning but now not helping. She feels like she is sleepy in the morning no matter how much sleep she gets the night before.  She denies snoring, apnea.  3. Kidney pain Patient reports that she saw urology, Dr Diona Fanti after she had pyelonephritis 04/15/2022.  She reports ongoing urinary leakage.  Wants to get a new urologist for second opinion.  Was not able to afford Myrbetriq.  It was several hundred dollars.  She simply has been dealing with urinary leakage.  She has a history of fistula  4. Sinusitis She reports sinus headache and sinus that has been pretty harsh since the fall.  She had a sinus scan done that showed swollen sinuses but that there was nothing they do about it.  She uses excedrine migraine to relieve.  She uses this almost  daily.  No purulence from nares.  No fevers reported.  Uses a nasal spray  5. KVQ2V Reports easy bruising, urinary leakage.  No chest pain or shortness of breath reported.  6.  Hypothyroidism Patient reports that she has been having some thinning of her eyebrows and fatigue as above.  Compliant with Synthroid.  ROS: Per HPI  Allergies  Allergen Reactions   Barium-Containing Compounds     hives   Percocet [Oxycodone-Acetaminophen] Other (See Comments)    migraine    Past Medical History:  Diagnosis Date   Cancer ([place])    cervical 2003   Vertigo     Current Outpatient Medications:    ibuprofen (ADVIL) 600 MG tablet, Take 1 tablet (600 mg total) by mouth every 8 (eight) hours as needed., Disp: 30 tablet, Rfl: 0   levothyroxine (SYNTHROID) 25 MCG tablet, TAKE 1 TABLET EVERY DAY, Disp: 30 tablet, Rfl: 0   meclizine (ANTIVERT) 12.5 MG tablet, Take 1 tablet (12.5 mg total) by mouth 3 (three) times daily as needed for dizziness., Disp: 30 tablet, Rfl: 0   mirtazapine (REMERON) 7.5 MG tablet, Take 1 tablet (7.5 mg total) by mouth at bedtime. To help with anxiety/ appetite, Disp: 90 tablet, Rfl: 3   primidone (MYSOLINE) 50 MG tablet, TAKE 1 TABLET 2 TIMES A DAY, Disp: 180 tablet, Rfl: 3   simvastatin (ZOCOR) 20 MG tablet, Take 1 tablet (20 mg total) by mouth daily at 6 PM., Disp: 90 tablet, Rfl: 3  Assessment/ Plan: 67 y.o. female   Acquired hypothyroidism -  Plan: levothyroxine (SYNTHROID) 25 MCG tablet, TSH, T4, free, CMP14+EGFR  Poor appetite - Plan: mirtazapine (REMERON) 15 MG tablet  Stage 3a chronic kidney disease (HCC) - Plan: CMP14+EGFR, CBC, VITAMIN D 25 Hydroxy (Vit-D Deficiency, Fractures)  Mixed hyperlipidemia - Plan: Lipid panel  Panic anxiety syndrome - Plan: mirtazapine (REMERON) 15 MG tablet  Anxiety about health - Plan: mirtazapine (REMERON) 15 MG tablet  Neuropathic pain - Plan: gabapentin (NEURONTIN) 300 MG capsule  Chronic fatigue - Plan: CMP14+EGFR, CBC,  Vitamin B12  Continuous leakage of urine - Plan: Ambulatory referral to Urogynecology  Multiple concerns today.  We tried her best to get through everything through this very limited telephonic visit.  Check thyroid levels, renal function, liver enzymes.  For now renewal on Synthroid but anticipate based on her symptomology we may need to advance this dose further  I advanced her mirtazapine to 15 mg in efforts to allow for better sleep  She will come in for renal function check, CBC and vitamin D level given known CKD 3 AA  Check fasting lipid  Start gabapentin for what sounds like neuropathic pain possibly trigeminal neuralgia?  Caution sedation  For her chronic fatigue, again may be related to undertreated thyroid.  Check CBC and vitamin B12  Referral to urogynecology for ongoing leakage of urine that is complicated by previous fistula  Start time: 2:24pm (text sent); 2:29pm End time: 2:49pm  Total time spent on patient care (including video visit/ documentation): 20 minutes  [place], [place] [phone]

## 2022-07-19 ENCOUNTER — Telehealth: Payer: Self-pay | Admitting: Family Medicine

## 2022-07-19 ENCOUNTER — Other Ambulatory Visit: Payer: Medicare Other

## 2022-07-19 DIAGNOSIS — E039 Hypothyroidism, unspecified: Secondary | ICD-10-CM | POA: Diagnosis not present

## 2022-07-19 DIAGNOSIS — N1831 Chronic kidney disease, stage 3a: Secondary | ICD-10-CM

## 2022-07-19 DIAGNOSIS — E782 Mixed hyperlipidemia: Secondary | ICD-10-CM

## 2022-07-19 DIAGNOSIS — R5382 Chronic fatigue, unspecified: Secondary | ICD-10-CM

## 2022-07-19 NOTE — Telephone Encounter (Signed)
In Dr. Marjean Donna note it says that the patient is supposed to come into the office and do blood work.  It was a virtual visit and I do not see any results from blood work.  Based on her last thyroid testing which was over a year ago the levels were normal so we could not make an adjustment based off of that, I think that she was supposed to come in and do some blood work for Dr. Marjean Donna note.  Unless she had blood work done somewhere different then let me know but without the blood work and adjustment could not be made

## 2022-07-19 NOTE — Telephone Encounter (Signed)
Pt has been notified.

## 2022-07-19 NOTE — Telephone Encounter (Signed)
Pt called, upset, stating that she had a visit with Dr Lajuana Ripple on 07/17/22 and it was discussed that Dr Lajuana Ripple was going to increase her dosage of Levothyroxine. Pt says she went to the pharmacy to pick up her new Rx and says dosage was not changed.   Pt wants dosage changed immediately and sent to pharmacy. Explained to pt that Dr Lajuana Ripple is on vacation. Pt aware but wants to know if someone else can fix her Rx?  Based on OV notes, I do see it noted by Dr Lajuana Ripple that increase in Levothyroxine may be needed but not sure if lead physician can take care of this or if pt has to wait on Dr Lajuana Ripple.  Please advise and call patient with update.

## 2022-07-21 LAB — CBC
Hematocrit: 38.2 % (ref 34.0–46.6)
Hemoglobin: 13.2 g/dL (ref 11.1–15.9)
MCH: 32.5 pg (ref 26.6–33.0)
MCHC: 34.6 g/dL (ref 31.5–35.7)
MCV: 94 fL (ref 79–97)
Platelets: 175 10*3/uL (ref 150–450)
RBC: 4.06 x10E6/uL (ref 3.77–5.28)
RDW: 12 % (ref 11.7–15.4)
WBC: 4.3 10*3/uL (ref 3.4–10.8)

## 2022-07-21 LAB — VITAMIN B12: Vitamin B-12: 547 pg/mL (ref 232–1245)

## 2022-07-21 LAB — CMP14+EGFR
ALT: 15 IU/L (ref 0–32)
AST: 17 IU/L (ref 0–40)
Albumin/Globulin Ratio: 2.3 — ABNORMAL HIGH (ref 1.2–2.2)
Albumin: 4.1 g/dL (ref 3.9–4.9)
Alkaline Phosphatase: 79 IU/L (ref 44–121)
BUN/Creatinine Ratio: 13 (ref 12–28)
BUN: 14 mg/dL (ref 8–27)
Bilirubin Total: <0.2 mg/dL (ref 0.0–1.2)
CO2: 24 mmol/L (ref 20–29)
Calcium: 9.4 mg/dL (ref 8.7–10.3)
Chloride: 105 mmol/L (ref 96–106)
Creatinine, Ser: 1.05 mg/dL — ABNORMAL HIGH (ref 0.57–1.00)
Globulin, Total: 1.8 g/dL (ref 1.5–4.5)
Glucose: 93 mg/dL (ref 70–99)
Potassium: 4.6 mmol/L (ref 3.5–5.2)
Sodium: 142 mmol/L (ref 134–144)
Total Protein: 5.9 g/dL — ABNORMAL LOW (ref 6.0–8.5)
eGFR: 59 mL/min/{1.73_m2} — ABNORMAL LOW (ref >59)

## 2022-07-21 LAB — LIPID PANEL
Chol/HDL Ratio: 2.6 ratio (ref 0.0–4.4)
Cholesterol, Total: 152 mg/dL (ref 100–199)
HDL: 59 mg/dL (ref >39)
LDL Chol Calc (NIH): 74 mg/dL (ref 0–99)
Triglycerides: 103 mg/dL (ref 0–149)
VLDL Cholesterol Cal: 19 mg/dL (ref 5–40)

## 2022-07-21 LAB — VITAMIN D 25 HYDROXY (VIT D DEFICIENCY, FRACTURES): Vit D, 25-Hydroxy: 22.8 ng/mL — ABNORMAL LOW (ref 30.0–100.0)

## 2022-07-21 LAB — T4, FREE: Free T4: 1 ng/dL (ref 0.82–1.77)

## 2022-07-21 LAB — TSH: TSH: 1.71 u[IU]/mL (ref 0.450–4.500)

## 2022-07-23 ENCOUNTER — Encounter: Payer: Self-pay | Admitting: Nurse Practitioner

## 2022-07-23 ENCOUNTER — Ambulatory Visit (INDEPENDENT_AMBULATORY_CARE_PROVIDER_SITE_OTHER): Payer: Medicare Other | Admitting: Nurse Practitioner

## 2022-07-23 VITALS — BP 123/71 | HR 63 | Temp 98.7°F | Ht 62.0 in | Wt 124.0 lb

## 2022-07-23 DIAGNOSIS — N898 Other specified noninflammatory disorders of vagina: Secondary | ICD-10-CM | POA: Diagnosis not present

## 2022-07-23 DIAGNOSIS — R300 Dysuria: Secondary | ICD-10-CM

## 2022-07-23 DIAGNOSIS — N3000 Acute cystitis without hematuria: Secondary | ICD-10-CM | POA: Diagnosis not present

## 2022-07-23 LAB — WET PREP FOR TRICH, YEAST, CLUE
Clue Cell Exam: NEGATIVE
Trichomonas Exam: NEGATIVE
Yeast Exam: NEGATIVE

## 2022-07-23 LAB — MICROSCOPIC EXAMINATION
Renal Epithel, UA: NONE SEEN /hpf
WBC, UA: >30 /hpf — AB (ref 0–5)

## 2022-07-23 LAB — URINALYSIS, COMPLETE
Bilirubin, UA: NEGATIVE
Glucose, UA: NEGATIVE
Ketones, UA: NEGATIVE
Nitrite, UA: NEGATIVE
Specific Gravity, UA: 1.015 (ref 1.005–1.030)
Urobilinogen, Ur: 0.2 mg/dL (ref 0.2–1.0)
pH, UA: 7 (ref 5.0–7.5)

## 2022-07-23 MED ORDER — DOXYCYCLINE HYCLATE 100 MG PO TABS: 100.0000 mg | ORAL_TABLET | Freq: Two times a day (BID) | ORAL | 0 refills | Status: DC

## 2022-07-23 NOTE — Progress Notes (Signed)
   Subjective:    Patient ID: [name], female    DOB: 08-Aug-1955, 67 y.o.   MRN: [id number]   Chief Complaint: Dysuria   Patient come sin stating that she has some uranriny urgency and she says she always has UTI with bacterial vaginosis.  Dysuria  This is a new problem. Episode onset: 3 days ago. The problem occurs every urination. The problem has been waxing and waning. She is Not sexually active. Associated symptoms include frequency and urgency. Pertinent negatives include no chills or discharge. She has tried nothing for the symptoms. The treatment provided mild relief.       Review of Systems  Constitutional:  Negative for chills and fever.  Genitourinary:  Positive for dysuria, frequency and urgency. Negative for vaginal discharge and vaginal pain.       Objective:   Physical Exam Vitals reviewed.  Constitutional:      Appearance: Normal appearance.  Cardiovascular:     Rate and Rhythm: Normal rate and regular rhythm.     Heart sounds: Normal heart sounds.  Pulmonary:     Effort: Pulmonary effort is normal.     Breath sounds: Normal breath sounds.  Skin:    General: Skin is warm.  Neurological:     General: No focal deficit present.     Mental Status: She is oriented to person, place, and time.  Psychiatric:        Mood and Affect: Mood normal.        Behavior: Behavior normal.    BP 123/71   Pulse 63   Temp 98.7 F (37.1 C)   Ht '5\' 2"'$  (1.575 m)   Wt 124 lb (56.2 kg)   SpO2 98%   BMI 22.68 kg/m         Assessment & Plan:  [name] in today with chief complaint of Dysuria and Vaginal Itching   1. Dysuria - Urinalysis, Complete - Urine Culture  2. Vagina itching - Wet prep, genital  3. Acute cystitis without hematuria Take medication as prescribe Cotton underwear Take shower not bath Cranberry juice, yogurt Force fluids AZO over the counter X2 days Culture pending RTO prn  - doxycycline (VIBRA-TABS) 100 MG tablet; Take 1  tablet (100 mg total) by mouth 2 (two) times daily. 1 po bid  Dispense: 20 tablet; Refill: 0    The above assessment and management plan was discussed with the patient. The patient verbalized understanding of and has agreed to the management plan. Patient is aware to call the clinic if symptoms persist or worsen. Patient is aware when to return to the clinic for a follow-up visit. Patient educated on when it is appropriate to go to the emergency department.   [name] Done, FNP

## 2022-07-23 NOTE — Patient Instructions (Signed)
Take medication as prescribe Cotton underwear Take shower not bath Cranberry juice, yogurt Force fluids AZO over the counter X2 days Culture pending RTO prn  

## 2022-07-26 DIAGNOSIS — H722X1 Other marginal perforations of tympanic membrane, right ear: Secondary | ICD-10-CM | POA: Diagnosis not present

## 2022-07-26 NOTE — Telephone Encounter (Signed)
Pt called wanting to know if Dr Dettinger can review her most recent TSH results now that we have the results back to determine if its ok for her thyroid medicine to be increased. Pt says she discussed this with Dr Lajuana Ripple at her last visit because she was losing her eyebrows and Dr Lajuana Ripple said she would increase for her but never did. Pt is confused.

## 2022-07-26 NOTE — Addendum Note (Signed)
Addended by: Michaela Corner on: 07/26/2022 02:29 PM   Modules accepted: Orders

## 2022-07-26 NOTE — Telephone Encounter (Signed)
Patient aware, wants to recheck level in 1-2 months, order placed.

## 2022-07-28 LAB — URINE CULTURE

## 2022-07-29 ENCOUNTER — Telehealth: Payer: Self-pay | Admitting: Family Medicine

## 2022-07-29 NOTE — Telephone Encounter (Signed)
Attempted to call pt , left vm for cb

## 2022-08-14 ENCOUNTER — Other Ambulatory Visit: Payer: Self-pay | Admitting: Family Medicine

## 2022-08-15 NOTE — Telephone Encounter (Signed)
Attempted to call pt , no answer - disregarding call

## 2022-08-23 ENCOUNTER — Telehealth: Payer: Self-pay | Admitting: Family Medicine

## 2022-08-23 NOTE — Telephone Encounter (Signed)
Pt wants to talk to [facility] about side effects from primidone (MYSOLINE) 50 MG tablet. Pt stopped taking the rx 2 weeks ago. The main concerns is equilibrium in the body. Please call back

## 2022-08-26 ENCOUNTER — Other Ambulatory Visit: Payer: Self-pay | Admitting: Family Medicine

## 2022-08-26 NOTE — Telephone Encounter (Signed)
Patient called to discuss primidone use.  She apparently has been suffering from vertigo ever since she had a perforated eardrum.  The eardrum still has not healed and is not anticipated to heal for another 3 months.  However, her daughter will read that primidone can cause vertigo.  She discontinued it 2 weeks ago.  She has seen no resolution vertigo.  She did not feel like the primidone was helping anyways with tremor so she is not anticipating that she will restart it.  Will cc Dr Constance Holster of [facility].

## 2022-08-30 ENCOUNTER — Encounter (HOSPITAL_COMMUNITY): Payer: Self-pay | Admitting: Emergency Medicine

## 2022-08-30 ENCOUNTER — Other Ambulatory Visit: Payer: Self-pay

## 2022-08-30 ENCOUNTER — Emergency Department (HOSPITAL_COMMUNITY): Payer: Medicare Other

## 2022-08-30 ENCOUNTER — Emergency Department (HOSPITAL_COMMUNITY)
Admission: EM | Admit: 2022-08-30 | Discharge: 2022-08-30 | Disposition: A | Discharge status: Home / Self Care | Payer: Medicare Other | Attending: Emergency Medicine | Admitting: Emergency Medicine

## 2022-08-30 DIAGNOSIS — R519 Headache, unspecified: Secondary | ICD-10-CM | POA: Diagnosis not present

## 2022-08-30 DIAGNOSIS — J0110 Acute frontal sinusitis, unspecified: Secondary | ICD-10-CM | POA: Diagnosis not present

## 2022-08-30 DIAGNOSIS — J321 Chronic frontal sinusitis: Secondary | ICD-10-CM | POA: Diagnosis not present

## 2022-08-30 MED ORDER — FLUTICASONE PROPIONATE 50 MCG/ACT NA SUSP: 2.0000 | Freq: Every day | NASAL | 0 refills | Status: DC

## 2022-08-30 MED ORDER — CETIRIZINE HCL 10 MG PO TABS: 10.0000 mg | ORAL_TABLET | Freq: Every day | ORAL | 0 refills | Status: DC

## 2022-08-30 MED ORDER — PREDNISONE 10 MG (21) PO TBPK: ORAL_TABLET | Freq: Every day | ORAL | 0 refills | Status: DC

## 2022-08-30 MED ORDER — AMOXICILLIN-POT CLAVULANATE 875-125 MG PO TABS: 1.0000 | ORAL_TABLET | Freq: Two times a day (BID) | ORAL | 0 refills | Status: DC

## 2022-08-30 NOTE — ED Provider Notes (Signed)
[place] EMERGENCY DEPARTMENT AT [facility] Provider Note   CSN: [id number] Arrival date & time: 08/30/22  1441     History  Chief Complaint  Patient presents with   Headache    Inner ear issue    TREONA UENO is a 67 y.o. female.   Headache  Patient is a 67 year old female present emergency room today with headaches for the past 6 weeks.  She states that she has been seen at urgent care as well as by her PCP Dr. Coralyn Mark and an ear nose and throat doctor.  She was told that she had a perforated tympanic membrane but she is frustrated with her continued symptoms.  She states that she is primarily suffering from frontal and maxillary headaches.  She denies any slurred speech weakness confusion no chest pain lightheadedness or dizziness.  No numbness weakness head injuries no blurry vision or double vision.  She states she has occasionally had some issues with balance but none currently.  She states she has suffered from significant sinus congestion no fevers    Home Medications Prior to Admission medications   Medication Sig Start Date End Date Taking? Authorizing Provider  amoxicillin-clavulanate (AUGMENTIN) 875-125 MG tablet Take 1 tablet by mouth every 12 (twelve) hours. 08/30/22  Yes Vinay Ertl, Kathleene Hazel, PA  cetirizine (ZYRTEC ALLERGY) 10 MG tablet Take 1 tablet (10 mg total) by mouth daily. 08/30/22  Yes Man Effertz S, PA  fluticasone (FLONASE) 50 MCG/ACT nasal spray Place 2 sprays into both nostrils daily for 14 days. 08/30/22 09/13/22 Yes Melenie Minniear S, PA  predniSONE (STERAPRED UNI-PAK 21 TAB) 10 MG (21) TBPK tablet Take by mouth daily. Take 6 tabs by mouth daily  for 2 days, then 5 tabs for 2 days, then 4 tabs for 2 days, then 3 tabs for 2 days, 2 tabs for 2 days, then 1 tab by mouth daily for 2 days 08/30/22  Yes Janeen Watson S, PA  gabapentin (NEURONTIN) 300 MG capsule Take 1 capsule (300 mg total) by mouth 2 (two) times daily. 07/17/22   Janora Norlander,  DO  ibuprofen (ADVIL) 600 MG tablet Take 1 tablet (600 mg total) by mouth every 8 (eight) hours as needed. 05/07/22   Ivy Lynn, NP  levothyroxine (SYNTHROID) 25 MCG tablet Take 1 tablet (25 mcg total) by mouth daily. 07/17/22   Janora Norlander, DO  meclizine (ANTIVERT) 12.5 MG tablet Take 1 tablet (12.5 mg total) by mouth 3 (three) times daily as needed for dizziness. 05/07/22   Ivy Lynn, NP  mirtazapine (REMERON) 15 MG tablet Take 1 tablet (15 mg total) by mouth at bedtime. To help with anxiety/ appetite 07/17/22   Ronnie Doss M, DO  simvastatin (ZOCOR) 20 MG tablet Take 1 tablet (20 mg total) by mouth daily at 6 PM. 07/17/22   Ronnie Doss M, DO      Allergies    Barium-containing compounds and Percocet [oxycodone-acetaminophen]    Review of Systems   Review of Systems  Neurological:  Positive for headaches.    Physical Exam Updated Vital Signs BP (!) 150/83 (BP Location: Right Arm)   Pulse (!) 56   Temp 98 F (36.7 C) (Oral)   Resp 17   Ht 5\' 2"  (1.575 m)   Wt 56.2 kg   SpO2 99%   BMI 22.68 kg/m  Physical Exam Vitals and nursing note reviewed.  Constitutional:      General: She is not in acute distress.  Appearance: Normal appearance. She is not ill-appearing.  HENT:     Head: Normocephalic and atraumatic.     Comments: There is frontal and maxillary sinus tenderness bilaterally but worse on right    Ears:     Comments: Right TM with what appears to be a small perforation. Left TM normal  Earwax in bilateral EACs nonobstructive Eyes:     General: No scleral icterus.       Right eye: No discharge.        Left eye: No discharge.     Conjunctiva/sclera: Conjunctivae normal.  Pulmonary:     Effort: Pulmonary effort is normal.     Breath sounds: No stridor.  Neurological:     Mental Status: She is alert and oriented to person, place, and time. Mental status is at baseline.     ED Results / Procedures / Treatments   Labs (all labs  ordered are listed, but only abnormal results are displayed) Labs Reviewed - No data to display  EKG None  Radiology CT Head Wo Contrast  Result Date: 08/30/2022 CLINICAL DATA:  Constant headache. EXAM: CT HEAD WITHOUT CONTRAST TECHNIQUE: Contiguous axial images were obtained from the base of the skull through the vertex without intravenous contrast. RADIATION DOSE REDUCTION: This exam was performed according to the departmental dose-optimization program which includes automated exposure control, adjustment of the mA and/or kV according to patient size and/or use of iterative reconstruction technique. COMPARISON:  None Available. FINDINGS: Brain: There is no evidence for acute hemorrhage, hydrocephalus, mass lesion, or abnormal extra-axial fluid collection. No definite CT evidence for acute infarction. Vascular: No hyperdense vessel or unexpected calcification. Skull: No evidence for fracture. No worrisome lytic or sclerotic lesion. Sinuses/Orbits: Right maxillary sinus is completely opacified. The remaining visualized paranasal sinuses and mastoid air cells are clear. Visualized portions of the globes and intraorbital fat are unremarkable. Other: None. IMPRESSION: 1. No acute intracranial abnormality. 2. Chronic right maxillary sinusitis. Electronically Signed   By: Misty Stanley M.D.   On: 08/30/2022 18:36    Procedures Procedures    Medications Ordered in ED Medications - No data to display  ED Course/ Medical Decision Making/ A&P                             Medical Decision Making Risk OTC drugs. Prescription drug management.   Patient is a 67 year old female present emergency room today with headaches for the past 6 weeks.  She states that she has been seen at urgent care as well as by her PCP Dr. Coralyn Mark and an ear nose and throat doctor.  She was told that she had a perforated tympanic membrane but she is frustrated with her continued symptoms.  She states that she is primarily  suffering from frontal and maxillary headaches.  She denies any slurred speech weakness confusion no chest pain lightheadedness or dizziness.  No numbness weakness head injuries no blurry vision or double vision.  She states she has occasionally had some issues with balance but none currently.  She states she has suffered from significant sinus congestion no fevers  Patient had a CT scan of the head without contrast that was obtained in triage this is without abnormal findings apart from evidence of chronic maxillary sinusitis.  She has needed to be primarily tender on the right side of her face.  She has had a prolonged course of symptoms.  She will need follow-up with ENT and  PCP however recommended fluticasone, Zyrtec, prednisone and Augmentin.  Nettie I believe would be the most helpful for her symptoms.  She will follow-up outpatient.  Normal vital signs afebrile well-appearing on exam.   Final Clinical Impression(s) / ED Diagnoses Final diagnoses:  Chronic frontal sinusitis    Rx / DC Orders ED Discharge Orders          Ordered    fluticasone (FLONASE) 50 MCG/ACT nasal spray  Daily        08/30/22 1859    cetirizine (ZYRTEC ALLERGY) 10 MG tablet  Daily        08/30/22 1859    predniSONE (STERAPRED UNI-PAK 21 TAB) 10 MG (21) TBPK tablet  Daily        08/30/22 1859    amoxicillin-clavulanate (AUGMENTIN) 875-125 MG tablet  Every 12 hours        08/30/22 1859              Tedd Sias, PA 08/30/22 2129    Wyvonnia Dusky, MD 08/30/22 2300

## 2022-08-30 NOTE — ED Triage Notes (Signed)
Patient arrives ambulatory by POV states she saw ENT several weeks ago. Reports having a whole in her right inner ear and causing her balance issues. C/o constant headache over past 2 weeks.

## 2022-08-30 NOTE — Telephone Encounter (Signed)
Pt has been notified.

## 2022-08-30 NOTE — Telephone Encounter (Signed)
She needs to be seen for this

## 2022-08-30 NOTE — ED Provider Triage Note (Signed)
Emergency Medicine Provider Triage Evaluation Note  [name] , a 67 y.o. female  was evaluated in triage.  Pt complains of headaches and balance problems which has been going on over the last month or so, seen by ENT and diagnosed with perforated TM, has not fully healed.  Nothing was different today but given it still going on she came to the ED for further evaluation.  Denies vision changes, lateralized weakness or numbness, head injury, diplopia.    Review of Systems  Positive:  Negative:   Physical Exam  BP 114/88 (BP Location: Left Arm)   Pulse 62   Temp 97.9 F (36.6 C) (Oral)   Resp 18   Ht 5\' 2"  (1.575 m)   Wt 56.2 kg   SpO2 98%   BMI 22.68 kg/m  Gen:   Awake, no distress   Resp:  Normal effort  MSK:   Moves extremities without difficulty  Other:  Right TM perforation.  CN 2 through 12 are grossly dry, pale extremity strength is symmetric bilaterally.  Gait testing deferred  Medical Decision Making  Medically screening exam initiated at 3:18 PM.  Appropriate orders placed.  MAGALI AMELIO was informed that the remainder of the evaluation will be completed by another provider, this initial triage assessment does not replace that evaluation, and the importance of remaining in the ED until their evaluation is complete.  Given persistent balance issues and new pattern of headache will get CT head.  Not a code stroke   Sherrill Raring, [place] 08/30/22 [id number]

## 2022-08-30 NOTE — Discharge Instructions (Signed)
Please use Nettie pot, fluticasone, Zyrtec as prescribed and take the prednisone as prescribed.  I recommend that you take the entire course of prednisone and if your symptoms improve hold off on taking Augmentin.  If you do not have improvement with the prednisone then you can then initiate the Augmentin if you do start taking Augmentin take it for the entire course.

## 2022-08-30 NOTE — Telephone Encounter (Signed)
Pt called in to ask if Dr Lajuana Ripple can call in rx for inner ear. Use [employer]

## 2022-09-02 NOTE — Telephone Encounter (Signed)
Pt called in to let Lajuana Ripple know that she went to ER on 08/30/2022. She says that she also had a scan done.

## 2022-09-02 NOTE — Telephone Encounter (Signed)
Thanks for the update

## 2022-09-02 NOTE — Telephone Encounter (Signed)
Patient aware and verbalized understanding. °

## 2022-09-03 ENCOUNTER — Telehealth: Payer: Self-pay

## 2022-09-03 NOTE — Telephone Encounter (Signed)
     Patient  visit on 3/15  at [place] you been able to follow up with your primary care physician?Yes over the phone   The patient was or was not able to obtain any needed medicine or equipment. Yes   Are there diet recommendations that you are having difficulty following? Na   Patient expresses understanding of discharge instructions and education provided has no other needs at this time.      [facility] [phone] [address], [place], [place] [address] Phone: [phone] Email: [email or web address]

## 2022-09-11 ENCOUNTER — Encounter (HOSPITAL_COMMUNITY): Payer: Self-pay | Admitting: *Deleted

## 2022-09-11 ENCOUNTER — Other Ambulatory Visit: Payer: Self-pay

## 2022-09-11 ENCOUNTER — Emergency Department (HOSPITAL_COMMUNITY)
Admission: EM | Admit: 2022-09-11 | Discharge: 2022-09-11 | Disposition: A | Discharge status: Home / Self Care | Payer: Medicare Other | Attending: Emergency Medicine | Admitting: Emergency Medicine

## 2022-09-11 ENCOUNTER — Emergency Department (HOSPITAL_COMMUNITY): Payer: Medicare Other

## 2022-09-11 DIAGNOSIS — R112 Nausea with vomiting, unspecified: Secondary | ICD-10-CM | POA: Diagnosis not present

## 2022-09-11 DIAGNOSIS — Z8541 Personal history of malignant neoplasm of cervix uteri: Secondary | ICD-10-CM | POA: Diagnosis not present

## 2022-09-11 DIAGNOSIS — N390 Urinary tract infection, site not specified: Secondary | ICD-10-CM | POA: Diagnosis not present

## 2022-09-11 DIAGNOSIS — R350 Frequency of micturition: Secondary | ICD-10-CM | POA: Diagnosis not present

## 2022-09-11 DIAGNOSIS — R109 Unspecified abdominal pain: Secondary | ICD-10-CM

## 2022-09-11 DIAGNOSIS — N134 Hydroureter: Secondary | ICD-10-CM | POA: Diagnosis not present

## 2022-09-11 DIAGNOSIS — N1330 Unspecified hydronephrosis: Secondary | ICD-10-CM | POA: Diagnosis not present

## 2022-09-11 DIAGNOSIS — R1032 Left lower quadrant pain: Secondary | ICD-10-CM | POA: Diagnosis not present

## 2022-09-11 LAB — COMPREHENSIVE METABOLIC PANEL
ALT: 10 U/L (ref 0–44)
AST: 13 U/L — ABNORMAL LOW (ref 15–41)
Albumin: 3.7 g/dL (ref 3.5–5.0)
Alkaline Phosphatase: 68 U/L (ref 38–126)
Anion gap: 12 (ref 5–15)
BUN: 31 mg/dL — ABNORMAL HIGH (ref 8–23)
CO2: 22 mmol/L (ref 22–32)
Calcium: 8.8 mg/dL — ABNORMAL LOW (ref 8.9–10.3)
Chloride: 106 mmol/L (ref 98–111)
Creatinine, Ser: 1.58 mg/dL — ABNORMAL HIGH (ref 0.44–1.00)
GFR, Estimated: 36 mL/min — ABNORMAL LOW (ref >60)
Glucose, Bld: 229 mg/dL — ABNORMAL HIGH (ref 70–99)
Potassium: 3.8 mmol/L (ref 3.5–5.1)
Sodium: 140 mmol/L (ref 135–145)
Total Bilirubin: 0.9 mg/dL (ref 0.3–1.2)
Total Protein: 6.2 g/dL — ABNORMAL LOW (ref 6.5–8.1)

## 2022-09-11 LAB — CBC
HCT: 41.6 % (ref 36.0–46.0)
Hemoglobin: 14.4 g/dL (ref 12.0–15.0)
MCH: 32.7 pg (ref 26.0–34.0)
MCHC: 34.6 g/dL (ref 30.0–36.0)
MCV: 94.3 fL (ref 80.0–100.0)
Platelets: 134 10*3/uL — ABNORMAL LOW (ref 150–400)
RBC: 4.41 MIL/uL (ref 3.87–5.11)
RDW: 12.6 % (ref 11.5–15.5)
WBC: 12.8 10*3/uL — ABNORMAL HIGH (ref 4.0–10.5)
nRBC: 0 % (ref 0.0–0.2)

## 2022-09-11 LAB — URINALYSIS, ROUTINE W REFLEX MICROSCOPIC
Bilirubin Urine: NEGATIVE
Glucose, UA: 50 mg/dL — AB
Ketones, ur: NEGATIVE mg/dL
Nitrite: NEGATIVE
Protein, ur: 100 mg/dL — AB
RBC / HPF: >50 RBC/hpf (ref 0–5)
Specific Gravity, Urine: 1.023 (ref 1.005–1.030)
WBC, UA: >50 WBC/hpf (ref 0–5)
pH: 6 (ref 5.0–8.0)

## 2022-09-11 LAB — LIPASE, BLOOD: Lipase: 22 U/L (ref 11–51)

## 2022-09-11 MED ORDER — ONDANSETRON HCL 4 MG PO TABS: 4.0000 mg | ORAL_TABLET | Freq: Three times a day (TID) | ORAL | 0 refills | Status: DC | PRN

## 2022-09-11 MED ORDER — MORPHINE SULFATE (PF) 4 MG/ML IV SOLN
4.0000 mg | Freq: Once | INTRAVENOUS | Status: AC
  Administered 2022-09-11: 4 mg via INTRAVENOUS
  Filled 2022-09-11: qty 1

## 2022-09-11 MED ORDER — ONDANSETRON HCL 4 MG/2ML IJ SOLN
4.0000 mg | Freq: Once | INTRAMUSCULAR | Status: AC | PRN
  Administered 2022-09-11: 4 mg via INTRAVENOUS
  Filled 2022-09-11: qty 2

## 2022-09-11 MED ORDER — SODIUM CHLORIDE 0.9 % IV BOLUS
500.0000 mL | Freq: Once | INTRAVENOUS | Status: AC
  Administered 2022-09-11: 500 mL via INTRAVENOUS

## 2022-09-11 MED ORDER — CIPROFLOXACIN HCL 500 MG PO TABS: 500.0000 mg | ORAL_TABLET | Freq: Two times a day (BID) | ORAL | 0 refills | Status: DC

## 2022-09-11 MED ORDER — SODIUM CHLORIDE 0.9 % IV SOLN
1.0000 g | Freq: Once | INTRAVENOUS | Status: AC
  Administered 2022-09-11: 1 g via INTRAVENOUS
  Filled 2022-09-11: qty 10

## 2022-09-11 NOTE — ED Notes (Signed)
Patient taken to CT at this time.

## 2022-09-11 NOTE — ED Provider Notes (Signed)
[place] EMERGENCY DEPARTMENT AT [facility] Provider Note   CSN: [id number] Arrival date & time: 09/11/22  0341     History  Chief Complaint  Patient presents with   Flank Pain    [name] is a 67 y.o. female.  Patient with history of cervical cancer presents today with complaints of left flank pain. She states that same began this morning and has been persistent since. She does endorse urinary frequency but denies any dysuria or hematuria. She states that last time she felt this way she had a kidney infection and was discharged with antibiotics and zofran and felt better soon after. This has happened several times previously. She has an appointment with urology in April. She does endorse some nausea and vomiting. Denies diarrhea, she is having regular bowel movements. No fevers or chills.   The history is provided by the patient. No language interpreter was used.  Flank Pain       Home Medications Prior to Admission medications   Medication Sig Start Date End Date Taking? Authorizing Provider  amoxicillin-clavulanate (AUGMENTIN) 875-125 MG tablet Take 1 tablet by mouth every 12 (twelve) hours. 08/30/22   Tedd Sias, PA  cetirizine (ZYRTEC ALLERGY) 10 MG tablet Take 1 tablet (10 mg total) by mouth daily. 08/30/22   [place], Kathleene Hazel, PA  fluticasone (FLONASE) 50 MCG/ACT nasal spray Place 2 sprays into both nostrils daily for 14 days. 08/30/22 09/13/22  Tedd Sias, PA  gabapentin (NEURONTIN) 300 MG capsule Take 1 capsule (300 mg total) by mouth 2 (two) times daily. 07/17/22   Janora Norlander, DO  ibuprofen (ADVIL) 600 MG tablet Take 1 tablet (600 mg total) by mouth every 8 (eight) hours as needed. 05/07/22   Ivy Lynn, NP  levothyroxine (SYNTHROID) 25 MCG tablet Take 1 tablet (25 mcg total) by mouth daily. 07/17/22   Janora Norlander, DO  meclizine (ANTIVERT) 12.5 MG tablet Take 1 tablet (12.5 mg total) by mouth 3 (three) times daily as needed  for dizziness. 05/07/22   Ivy Lynn, NP  mirtazapine (REMERON) 15 MG tablet Take 1 tablet (15 mg total) by mouth at bedtime. To help with anxiety/ appetite 07/17/22   Ronnie Doss M, DO  predniSONE (STERAPRED UNI-PAK 21 TAB) 10 MG (21) TBPK tablet Take by mouth daily. Take 6 tabs by mouth daily  for 2 days, then 5 tabs for 2 days, then 4 tabs for 2 days, then 3 tabs for 2 days, 2 tabs for 2 days, then 1 tab by mouth daily for 2 days 08/30/22   Tedd Sias, PA  simvastatin (ZOCOR) 20 MG tablet Take 1 tablet (20 mg total) by mouth daily at 6 PM. 07/17/22   Ronnie Doss M, DO      Allergies    Barium-containing compounds and Percocet [oxycodone-acetaminophen]    Review of Systems   Review of Systems  Genitourinary:  Positive for flank pain.  All other systems reviewed and are negative.   Physical Exam Updated Vital Signs BP (!) 110/57   Pulse 64   Temp 97.6 F (36.4 C) (Oral)   Resp 17   SpO2 100%  Physical Exam Vitals and nursing note reviewed.  Constitutional:      General: She is not in acute distress.    Appearance: Normal appearance. She is normal weight. She is not ill-appearing, toxic-appearing or diaphoretic.  HENT:     Head: Normocephalic and atraumatic.  Cardiovascular:  Rate and Rhythm: Normal rate.  Pulmonary:     Effort: Pulmonary effort is normal. No respiratory distress.  Abdominal:     General: Abdomen is flat.     Palpations: Abdomen is soft.     Tenderness: There is abdominal tenderness.     Comments: LLQ TTP and +CVA tenderness TTP  Musculoskeletal:        General: Normal range of motion.     Cervical back: Normal range of motion.  Skin:    General: Skin is warm and dry.  Neurological:     General: No focal deficit present.     Mental Status: She is alert.  Psychiatric:        Mood and Affect: Mood normal.        Behavior: Behavior normal.     ED Results / Procedures / Treatments   Labs (all labs ordered are listed, but only  abnormal results are displayed) Labs Reviewed  COMPREHENSIVE METABOLIC PANEL - Abnormal; Notable for the following components:      Result Value   Glucose, Bld 229 (*)    BUN 31 (*)    Creatinine, Ser 1.58 (*)    Calcium 8.8 (*)    Total Protein 6.2 (*)    AST 13 (*)    GFR, Estimated 36 (*)    All other components within normal limits  CBC - Abnormal; Notable for the following components:   WBC 12.8 (*)    Platelets 134 (*)    All other components within normal limits  LIPASE, BLOOD  URINALYSIS, ROUTINE W REFLEX MICROSCOPIC    EKG None  Radiology CT Renal Stone Study  Result Date: 09/11/2022 CLINICAL DATA:  Increase pacing left flank pain for 24 hours. EXAM: CT ABDOMEN AND PELVIS WITHOUT CONTRAST TECHNIQUE: Multidetector CT imaging of the abdomen and pelvis was performed following the standard protocol without IV contrast. RADIATION DOSE REDUCTION: This exam was performed according to the departmental dose-optimization program which includes automated exposure control, adjustment of the mA and/or kV according to patient size and/or use of iterative reconstruction technique. COMPARISON:  04/15/2022 FINDINGS: Lower chest:  Small sliding hiatal hernia.  No acute finding Hepatobiliary: No focal liver abnormality.Cholecystectomy. No new or worrisome biliary dilatation. Pancreas: Prominent generalized atrophy Spleen: Unremarkable. Adrenals/Urinary Tract: 2.3 cm left adrenal nodule with -3 Hounsfield units, consistent with adenoma. No follow-up imaging is recommended. Chronic left hydronephrosis and hydroureter to the level of the pelvis where there is prominent scarring. Associated renal cortical thinning but increased perinephric stranding without detected collection. No visible collecting system hemorrhage. Unremarkable bladder. Stomach/Bowel: No obstruction or visible inflammation. Generalized colonic stool. Vascular/Lymphatic: Atheromatous calcification which is advanced at the level of the  aorta and iliacs. Reproductive:Hysterectomy. Extensive pelvic scarring in this patient with history of cervical cancer. Other: No ascites or pneumoperitoneum. Musculoskeletal: Osteopenia.  No acute or aggressive finding IMPRESSION: 1. 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 Increase in left perinephric stranding, consider UTI. 2. Advanced atherosclerosis. Electronically Signed   By: Jorje Guild M.D.   On: 09/11/2022 06:20    Procedures Procedures    Medications Ordered in ED Medications  ondansetron (ZOFRAN) injection 4 mg (4 mg Intravenous Given 09/11/22 0448)  morphine (PF) 4 MG/ML injection 4 mg (4 mg Intravenous Given 09/11/22 0540)  sodium chloride 0.9 % bolus 500 mL (500 mLs Intravenous New Bag/Given 09/11/22 0540)    ED Course/ Medical Decision Making/ A&P  Medical Decision Making Amount and/or Complexity of Data Reviewed Labs: ordered. Radiology: ordered.  Risk Prescription drug management.   This patient is a 67 y.o. female who presents to the ED for concern of left flank pain, this involves an extensive number of treatment options, and is a complaint that carries with it a high risk of complications and morbidity. The emergent differential diagnosis prior to evaluation includes, but is not limited to,  UTI, pyelonephritis, kidney stone . This is not an exhaustive differential.   Past Medical History / Co-morbidities / Social History: Hx cervical cancer, multiple previous UTIs  Additional history: Chart reviewed. Pertinent results include: had UTI in 10/23, culture grew multiple species, recommend recollection. Appears she normally has good outcome with cipro  Physical Exam: Physical exam performed. The pertinent findings include: LLQ TTP, left CVA TTP  Lab Tests: I ordered, and personally interpreted labs.  The pertinent results include:  WBC 12.8, glucose 229, BUN 31,  creatinine 1.58   Imaging Studies: I ordered imaging studies including CT renal . I independently visualized and interpreted imaging which showed   1. 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 Increase in left perinephric stranding, consider UTI. 2. Advanced atherosclerosis.   I agree with the radiologist interpretation.     Medications: I ordered medication including morphine, zofran, fluids  for pain, dehydration, nausea/vomiting. Reevaluation of the patient after these medicines showed that the patient improved. I have reviewed the patients home medicines and have made adjustments as needed.  Disposition:  Patient presents today with complaints of left flank pain since this morning.  She is afebrile, nontoxic-appearing, and in no acute distress with reassuring vital signs.  Laboratory evaluation reassuring, CT imaging shows findings consistent with UTI.  Patient UA is pending.  It appears she normally has good outcome with Cipro.  Most recent culture grew multiple species, will order recollection.  She has urology follow-up scheduled for April.  UA pending at shift change and will determine dispo, likely d/c with zofran and cipro. Patient is understanding and amenable with plan.  Care handoff to Domenic Moras, PA-C at shift change. Please see their note for further evaluation and dispo.   Final Clinical Impression(s) / ED Diagnoses Final diagnoses:  Left flank pain    Rx / DC Orders ED Discharge Orders     None         Nestor Lewandowsky 09/11/22 [place], April, MD 09/11/22 [phone]

## 2022-09-11 NOTE — ED Triage Notes (Signed)
Pt c/o left flank pain throughout the day, worsing back pain tonight with NV. Pt restless, unable to get into position of comfort. IV attempted without success.

## 2022-09-11 NOTE — Discharge Instructions (Signed)
You have symptoms consistent with a urinary tract infection.  Please take medication prescribed.  Please follow-up closely with your urologist for outpatient evaluation and management of your recurrent urinary tract infection.  Return to the ER if you have any concern.

## 2022-09-11 NOTE — ED Provider Notes (Signed)
Received signout from previous provider, please see her note for complete H&P.  This is a 67 year old female with history of recurrent UTI presenting with complaints of left flank pain that began early last night.  She also endorsed urinary frequency and states her symptoms felt similar to prior kidney infection that she has had in the past.  Labs and imaging was obtained intimately viewed interpreted by me and I agree with radiology interpretation.  Patient's urinalysis with turbid appearance, moderate hemoglobin and urine dipsticks, with evidence of leukocyte esterase, greater than 50 WBC as well as a few bacteria suggestive of UTI.  She does have an elevated count of 12.8.  Evidence of mild AKI with creatinine of 1.58.  A CT scan of the abdomen pelvis which demonstrates a chronic left hydroureteronephrosis with extensive pelvic scarring and also increasing left perinephric stranding which is suggestive of possible UTI.  In the setting of infection in urine and CT scan confirmed such, we will treat with Cipro as patient has had a good outcome with it in the past per EMR review.  Will also prescribe Zofran as needed for nausea.  8:28 AM Patient given Rocephin in the ER with additional pain management as well.  Patient does have an appointment with her urologist in April for which she will benefit from further assessment due to her recurrent infection.  BP 125/60   Pulse 68   Temp 97.6 F (36.4 C) (Oral)   Resp 18   Ht 5\' 2"  (1.575 m)   Wt 56.2 kg   SpO2 98%   BMI 22.66 kg/m   Results for orders placed or performed during the hospital encounter of 09/11/22  Lipase, blood  Result Value Ref Range   Lipase 22 11 - 51 U/L  Comprehensive metabolic panel  Result Value Ref Range   Sodium 140 135 - 145 mmol/L   Potassium 3.8 3.5 - 5.1 mmol/L   Chloride 106 98 - 111 mmol/L   CO2 22 22 - 32 mmol/L   Glucose, Bld 229 (H) 70 - 99 mg/dL   BUN 31 (H) 8 - 23 mg/dL   Creatinine, Ser 1.58 (H) 0.44 -  1.00 mg/dL   Calcium 8.8 (L) 8.9 - 10.3 mg/dL   Total Protein 6.2 (L) 6.5 - 8.1 g/dL   Albumin 3.7 3.5 - 5.0 g/dL   AST 13 (L) 15 - 41 U/L   ALT 10 0 - 44 U/L   Alkaline Phosphatase 68 38 - 126 U/L   Total Bilirubin 0.9 0.3 - 1.2 mg/dL   GFR, Estimated 36 (L) >60 mL/min   Anion gap 12 5 - 15  Urinalysis, Routine w reflex microscopic -Urine, Clean Catch  Result Value Ref Range   Color, Urine YELLOW YELLOW   APPearance TURBID (A) CLEAR   Specific Gravity, Urine 1.023 1.005 - 1.030   pH 6.0 5.0 - 8.0   Glucose, UA 50 (A) NEGATIVE mg/dL   Hgb urine dipstick MODERATE (A) NEGATIVE   Bilirubin Urine NEGATIVE NEGATIVE   Ketones, ur NEGATIVE NEGATIVE mg/dL   Protein, ur 100 (A) NEGATIVE mg/dL   Nitrite NEGATIVE NEGATIVE   Leukocytes,Ua MODERATE (A) NEGATIVE   RBC / HPF >50 0 - 5 RBC/hpf   WBC, UA >50 0 - 5 WBC/hpf   Bacteria, UA FEW (A) NONE SEEN   Squamous Epithelial / HPF 0-5 0 - 5 /HPF   WBC Clumps PRESENT   CBC  Result Value Ref Range   WBC 12.8 (H) 4.0 -  10.5 K/uL   RBC 4.41 3.87 - 5.11 MIL/uL   Hemoglobin 14.4 12.0 - 15.0 g/dL   HCT 41.6 36.0 - 46.0 %   MCV 94.3 80.0 - 100.0 fL   MCH 32.7 26.0 - 34.0 pg   MCHC 34.6 30.0 - 36.0 g/dL   RDW 12.6 11.5 - 15.5 %   Platelets 134 (L) 150 - 400 K/uL   nRBC 0.0 0.0 - 0.2 %   CT Renal Stone Study  Result Date: 09/11/2022 CLINICAL DATA:  Increase pacing left flank pain for 24 hours. EXAM: CT ABDOMEN AND PELVIS WITHOUT CONTRAST TECHNIQUE: Multidetector CT imaging of the abdomen and pelvis was performed following the standard protocol without IV contrast. RADIATION DOSE REDUCTION: This exam was performed according to the departmental dose-optimization program which includes automated exposure control, adjustment of the mA and/or kV according to patient size and/or use of iterative reconstruction technique. COMPARISON:  04/15/2022 FINDINGS: Lower chest:  Small sliding hiatal hernia.  No acute finding Hepatobiliary: No focal liver  abnormality.Cholecystectomy. No new or worrisome biliary dilatation. Pancreas: Prominent generalized atrophy Spleen: Unremarkable. Adrenals/Urinary Tract: 2.3 cm left adrenal nodule with -3 Hounsfield units, consistent with adenoma. No follow-up imaging is recommended. Chronic left hydronephrosis and hydroureter to the level of the pelvis where there is prominent scarring. Associated renal cortical thinning but increased perinephric stranding without detected collection. No visible collecting system hemorrhage. Unremarkable bladder. Stomach/Bowel: No obstruction or visible inflammation. Generalized colonic stool. Vascular/Lymphatic: Atheromatous calcification which is advanced at the level of the aorta and iliacs. Reproductive:Hysterectomy. Extensive pelvic scarring in this patient with history of cervical cancer. Other: No ascites or pneumoperitoneum. Musculoskeletal: Osteopenia.  No acute or aggressive finding IMPRESSION: 1. 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 Increase in left perinephric stranding, consider UTI. 2. Advanced atherosclerosis. Electronically Signed   By: Jorje Guild M.D.   On: 09/11/2022 06:20   CT Head Wo Contrast  Result Date: 08/30/2022 CLINICAL DATA:  Constant headache. EXAM: CT HEAD WITHOUT CONTRAST TECHNIQUE: Contiguous axial images were obtained from the base of the skull through the vertex without intravenous contrast. RADIATION DOSE REDUCTION: This exam was performed according to the departmental dose-optimization program which includes automated exposure control, adjustment of the mA and/or kV according to patient size and/or use of iterative reconstruction technique. COMPARISON:  None Available. FINDINGS: Brain: There is no evidence for acute hemorrhage, hydrocephalus, mass lesion, or abnormal extra-axial fluid collection. No definite CT evidence for acute infarction. Vascular: No hyperdense vessel or  unexpected calcification. Skull: No evidence for fracture. No worrisome lytic or sclerotic lesion. Sinuses/Orbits: Right maxillary sinus is completely opacified. The remaining visualized paranasal sinuses and mastoid air cells are clear. Visualized portions of the globes and intraorbital fat are unremarkable. Other: None. IMPRESSION: 1. No acute intracranial abnormality. 2. Chronic right maxillary sinusitis. Electronically Signed   By: Misty Stanley M.D.   On: 08/30/2022 18:36      Domenic Moras, PA-C 09/11/22 [id number]    Lennice Sites, DO 09/11/22 1418

## 2022-09-16 ENCOUNTER — Telehealth: Payer: Self-pay

## 2022-09-16 NOTE — Telephone Encounter (Signed)
     Patient  visit on 3/27  at [facility]  Have you been able to follow up with your primary care physician? No    The patient was or was not able to obtain any needed medicine or equipment. Yes   Are there diet recommendations that you are having difficulty following? Na   Patient expresses understanding of discharge instructions and education provided has no other needs at this time.  Yes       Lamark Schue [facility], [place] [phone] [address], [place],  [address] Phone: [phone] Email: [email or web address]    

## 2022-09-23 ENCOUNTER — Encounter: Payer: Self-pay | Admitting: Nurse Practitioner

## 2022-09-23 ENCOUNTER — Ambulatory Visit (INDEPENDENT_AMBULATORY_CARE_PROVIDER_SITE_OTHER): Payer: Medicare Other | Admitting: Nurse Practitioner

## 2022-09-23 ENCOUNTER — Other Ambulatory Visit: Payer: Self-pay

## 2022-09-23 VITALS — BP 107/72 | HR 70 | Temp 97.5°F | Resp 20 | Ht 62.0 in | Wt 124.0 lb

## 2022-09-23 DIAGNOSIS — R300 Dysuria: Secondary | ICD-10-CM | POA: Diagnosis not present

## 2022-09-23 DIAGNOSIS — N3001 Acute cystitis with hematuria: Secondary | ICD-10-CM

## 2022-09-23 LAB — URINALYSIS, COMPLETE
Bilirubin, UA: NEGATIVE
Glucose, UA: NEGATIVE
Ketones, UA: NEGATIVE
Nitrite, UA: NEGATIVE
Specific Gravity, UA: 1.02 (ref 1.005–1.030)
Urobilinogen, Ur: 0.2 mg/dL (ref 0.2–1.0)
pH, UA: 6 (ref 5.0–7.5)

## 2022-09-23 LAB — MICROSCOPIC EXAMINATION
Epithelial Cells (non renal): NONE SEEN /hpf (ref 0–10)
RBC, Urine: >30 /hpf — AB (ref 0–2)
WBC, UA: >30 /hpf — AB (ref 0–5)

## 2022-09-23 MED ORDER — FLUCONAZOLE 150 MG PO TABS: 150.0000 mg | ORAL_TABLET | Freq: Once | ORAL | 0 refills | Status: AC

## 2022-09-23 MED ORDER — SIMVASTATIN 20 MG PO TABS: 20.0000 mg | ORAL_TABLET | Freq: Every day | ORAL | 0 refills | Status: DC

## 2022-09-23 MED ORDER — SULFAMETHOXAZOLE-TRIMETHOPRIM 800-160 MG PO TABS: 1.0000 | ORAL_TABLET | Freq: Two times a day (BID) | ORAL | 0 refills | Status: DC

## 2022-09-23 NOTE — Patient Instructions (Signed)
Take medication as prescribe Cotton underwear Take shower not bath Cranberry juice, yogurt Force fluids AZO over the counter X2 days Culture pending RTO prn  

## 2022-09-23 NOTE — Progress Notes (Signed)
   Subjective:    Patient ID: [name], female    DOB: Jan 10, 1956, 67 y.o.   MRN: [id number]   Chief Complaint: Dysuria and vaginal irritation   Dysuria  This is a new problem. The current episode started in the past 7 days. The problem occurs every urination. The pain is at a severity of 5/10. The pain is mild. Associated symptoms include chills, frequency and urgency. Pertinent negatives include no discharge or flank pain. Associated symptoms comments: Vaginal itching. She has tried nothing for the symptoms. The treatment provided no relief.    Patient Active Problem List   Diagnosis Date Noted   Neck pain 05/07/2022   Right ear pain 05/07/2022   CKD (chronic kidney disease) stage 3, GFR 30-59 ml/min 05/24/2021   Dysuria 04/16/2021   Vaginal irritation 04/16/2021   Non-recurrent acute serous otitis media of right ear 12/13/2020   Constipation 10/25/2020   Nausea 10/25/2020   Mixed hyperlipidemia 03/02/2020   Colovaginal fistula 10/05/2018   Hypothyroidism 06/24/2018   Smoker 06/24/2018   History of cervical cancer 06/24/2018   Essential tremor 05/08/2018   Chronic daily headache 05/06/2018   Lumbar spine painful on movement 05/06/2018   Vertigo 05/06/2018   Leg cramps 05/06/2018   Allergic rhinitis 04/09/2017   Anxiety 04/09/2017   Need for immunization against influenza 04/09/2017   Annual physical exam 04/09/2017       Review of Systems  Constitutional:  Positive for chills.  Genitourinary:  Positive for dysuria, frequency and urgency. Negative for flank pain.       Objective:   Physical Exam Vitals and nursing note reviewed.  Constitutional:      Appearance: Normal appearance.  Cardiovascular:     Rate and Rhythm: Normal rate and regular rhythm.     Heart sounds: Normal heart sounds.  Pulmonary:     Effort: Pulmonary effort is normal.     Breath sounds: Normal breath sounds.  Neurological:     General: No focal deficit present.     Mental Status:  She is alert.    BP 107/72   Pulse 70   Temp (!) 97.5 F (36.4 C) (Temporal)   Resp 20   Ht 5\' 2"  (1.575 m)   Wt 124 lb (56.2 kg)   SpO2 97%   BMI 22.68 kg/m          Assessment & Plan:   [name] in today with chief complaint of Dysuria and vaginal irritation   1. Dysuria - Urinalysis, Complete - Urine Culture  2. Acute cystitis with hematuria Take medication as prescribe Cotton underwear Take shower not bath Cranberry juice, yogurt Force fluids AZO over the counter X2 days Culture pending RTO prn  - sulfamethoxazole-trimethoprim (BACTRIM DS) 800-160 MG tablet; Take 1 tablet by mouth 2 (two) times daily.  Dispense: 20 tablet; Refill: 0 - fluconazole (DIFLUCAN) 150 MG tablet; Take 1 tablet (150 mg total) by mouth once for 1 dose.  Dispense: 1 tablet; Refill: 0    The above assessment and management plan was discussed with the patient. The patient verbalized understanding of and has agreed to the management plan. Patient is aware to call the clinic if symptoms persist or worsen. Patient is aware when to return to the clinic for a follow-up visit. Patient educated on when it is appropriate to go to the emergency department.   Mary-Margaret Daphine Deutscher, FNP

## 2022-09-26 LAB — URINE CULTURE

## 2022-10-11 ENCOUNTER — Other Ambulatory Visit (HOSPITAL_COMMUNITY)
Admission: RE | Admit: 2022-10-11 | Discharge: 2022-10-11 | Disposition: A | Discharge status: Home / Self Care | Payer: Medicare Other | Source: Other Acute Inpatient Hospital | Attending: Obstetrics and Gynecology | Admitting: Obstetrics and Gynecology

## 2022-10-11 ENCOUNTER — Ambulatory Visit (INDEPENDENT_AMBULATORY_CARE_PROVIDER_SITE_OTHER): Payer: Medicare Other | Admitting: Obstetrics and Gynecology

## 2022-10-11 ENCOUNTER — Encounter: Payer: Self-pay | Admitting: Obstetrics and Gynecology

## 2022-10-11 VITALS — BP 123/78 | HR 73 | Ht 59.6 in | Wt 128.4 lb

## 2022-10-11 DIAGNOSIS — N3281 Overactive bladder: Secondary | ICD-10-CM

## 2022-10-11 DIAGNOSIS — R350 Frequency of micturition: Secondary | ICD-10-CM | POA: Insufficient documentation

## 2022-10-11 DIAGNOSIS — N952 Postmenopausal atrophic vaginitis: Secondary | ICD-10-CM

## 2022-10-11 DIAGNOSIS — N941 Unspecified dyspareunia: Secondary | ICD-10-CM | POA: Diagnosis not present

## 2022-10-11 DIAGNOSIS — N393 Stress incontinence (female) (male): Secondary | ICD-10-CM

## 2022-10-11 LAB — POCT URINALYSIS DIPSTICK
Bilirubin, UA: NEGATIVE
Glucose, UA: NEGATIVE
Ketones, UA: NEGATIVE
Nitrite, UA: NEGATIVE
Protein, UA: NEGATIVE
Spec Grav, UA: 1.02 (ref 1.010–1.025)
Urobilinogen, UA: 0.2 E.U./dL
pH, UA: 6.5 (ref 5.0–8.0)

## 2022-10-11 MED ORDER — MIRABEGRON ER 25 MG PO TB24: 25.0000 mg | ORAL_TABLET | Freq: Every day | ORAL | 5 refills | Status: DC

## 2022-10-11 MED ORDER — ESTRADIOL 0.1 MG/GM VA CREA: TOPICAL_CREAM | VAGINAL | 11 refills | Status: DC

## 2022-10-11 NOTE — Progress Notes (Signed)
[facility] New Patient Evaluation and Consultation  Referring Provider: [name], DO PCP: [name], DO Date of Service: 10/11/2022  SUBJECTIVE Chief Complaint: New Patient (Initial Visit) [name] is a 67 y.o. female is here for urinary leakage.)  History of Present Illness: [name] is a 67 y.o. White or Caucasian female seen in consultation at the request of Dr. Nadine Counts for evaluation of incontinence and frequent infections.    Review of records significant for: Colovaginal fistula-Repaired with [facility]  Previously saw Dr Retta Diones  with [facility]  Hx of pelvic radiation for cervical carcinoma  Has previously tried Toviaz 8mg  that was prescribed by [facility]   CT Renal Stone Study 09/11/22: IMPRESSION: 1. 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 Increase in left perinephric stranding, consider UTI. 2. Advanced atherosclerosis.  Urinary Symptoms: Leaks urine with while asleep Leaks 4-5 time(s) per days.  Pad use: 4 pads per day.   [name] is bothered by her UI symptoms.  Day time voids 3-4.  Nocturia: 1-2 times per night to void. Voiding dysfunction: [name] empties her bladder well.  does not use a catheter to empty bladder.  When urinating, [name] feels dribbling after finishing Drinks: 8-10oz Coffee in am, 8oz Tea or soda, 4 cups of water per day  UTIs: 4 UTI's in the last year.   Reports history of pyelonephritis  Pelvic Organ Prolapse Symptoms:                  [name] Denies a feeling of a bulge the vaginal area. It has been present for  [name] Denies seeing a bulge.    Bowel Symptom: Bowel movements: 1-2 time(s) per day Stool consistency: hard or soft  Straining: no.  Splinting: no.  Incomplete evacuation: no.  [name] Admits to accidental bowel leakage / fecal incontinence  Occurs: 3-4 time(s) per month  Consistency with leakage: soft  Bowel  regimen: fiber  Sexual Function Sexually active: no.  Sexual orientation: Straight Pain with sex: No  Pelvic Pain Denies pelvic pain    Past Medical History:  Past Medical History:  Diagnosis Date   Atrophic kidney    Cancer (HCC)    cervical 2003   Colovaginal fistula    Pyelonephritis    Vertigo      Past Surgical History:   Past Surgical History:  Procedure Laterality Date   ABDOMINAL HYSTERECTOMY     cervical cancer   CHOLECYSTECTOMY     Colovaginal fistula repair  2019   low anterior resection with colovaginal fistula takedown, fistula repair, loop ileostomy   CYSTOSCOPY  2021   EXPLORATORY LAPAROTOMY     SBO   ILEOSTOMY     2019   ileostomy reversal       Past OB/GYN History: G1 P1 Vaginal deliveries: 1,  Forceps/ Vacuum deliveries: 0, Cesarean section: 0 Menopausal: Yes, at age 27's Last pap smear was 2022.  Any history of abnormal pap smears: no.   Medications: [name] has a current medication list which includes the following prescription(s): cetirizine, [START ON 10/14/2022] estradiol, ibuprofen, levothyroxine, meclizine, mirabegron er, ondansetron, simvastatin, and fluticasone.   Allergies: Patient is allergic to barium-containing compounds and percocet [oxycodone-acetaminophen].   Social History:  Social History   Tobacco Use   Smoking status: Former    Packs/day: .15    Types: Cigarettes   Smokeless tobacco: Never  Vaping Use   Vaping Use: Never used  Substance Use Topics   Alcohol use: Not Currently   Drug use: No    Relationship status: married [name] lives with husband.   [name] is not employed . Regular exercise: No History of abuse: No  Family History:   Family History  Problem Relation Age of Onset   Heart disease Father    Diabetes Father    Parkinson's disease Neg Hx      Review of Systems: Review of Systems  Constitutional:  Negative for fever, malaise/fatigue and weight loss.  Respiratory:  Negative for cough, shortness  of breath and wheezing.   Cardiovascular:  Positive for leg swelling. Negative for chest pain and palpitations.  Gastrointestinal:  Positive for abdominal pain. Negative for blood in stool, nausea and vomiting.  Neurological:  Positive for headaches. Negative for dizziness, seizures and weakness.  Endo/Heme/Allergies:  Bruises/bleeds easily.     OBJECTIVE Physical Exam: Vitals:   10/11/22 0823  BP: 123/78  Pulse: 73  Weight: 128 lb 6.4 oz (58.2 kg)  Height: 4' 11.6" (1.514 m)    Physical Exam Constitutional:      General: [name] is not in acute distress. Pulmonary:     Effort: Pulmonary effort is normal.  Abdominal:     General: There is no distension.     Palpations: Abdomen is soft.     Tenderness: There is no abdominal tenderness. There is no rebound.  Musculoskeletal:        General: No swelling. Normal range of motion.  Skin:    General: Skin is warm and dry.     Findings: No rash.  Neurological:     Mental Status: [name] is alert and oriented to person, place, and time.  Psychiatric:        Mood and Affect: Mood normal.        Behavior: Behavior normal.      GU / Detailed Urogynecologic Evaluation:  Pelvic Exam: Normal external female genitalia; Bartholin's and Skene's glands normal in appearance; urethral meatus normal in appearance, no urethral masses or discharge.   CST: negative  s/p hysterectomy: Speculum exam reveals normal vaginal mucosa with  significant atrophy and normal vaginal cuff.  No masses palpated, short vaginal length.   Pelvic floor strength I/V  Pelvic floor musculature: Right levator non-tender, Right obturator non-tender, Left levator non-tender, Left obturator non-tender  POP-Q:  Deferred- no prolapse   Rectal Exam:  Normal external rectum  Post-Void Residual (PVR) by Bladder Scan: In order to evaluate bladder emptying, we discussed obtaining a postvoid residual and [name] agreed to this procedure.  Procedure: The ultrasound unit was  placed on the patient's abdomen in the suprapubic region after the patient had voided. A PVR of 77 ml was obtained by bladder scan.  Laboratory Results: POC urine: small leukocytes, negative nitrites   ASSESSMENT AND PLAN Ms. Pickford is a 67 y.o. with:  1. Overactive bladder   2. Urinary frequency   3. SUI (stress urinary incontinence, female)   4. Dyspareunia, female   5. Vaginal atrophy    OAB - We discussed the symptoms of overactive bladder (OAB), which include urinary urgency, urinary frequency, nocturia, with or without urge incontinence.  While we do not know the exact etiology of OAB, several treatment options exist. We discussed management including behavioral therapy (decreasing bladder irritants, urge suppression strategies, timed voids, bladder retraining), physical therapy, medication; for refractory cases posterior tibial nerve stimulation, sacral neuromodulation, and intravesical botulinum toxin injection.  - Prescribed Myrbetriq 25mg  daily (previously tried [place]  Samples provided. For Beta-3 agonist medication, we discussed the potential side effect of elevated blood pressure which is more likely to occur in individuals with uncontrolled hypertension. - Avoid bladder irritants such as coffee, tea, soda. Avoid drinking 2-3 hours prior to bedtime - Referral also placed to pelvic PT.   2. SUI - For treatment of stress urinary incontinence,  non-surgical options include expectant management, weight loss, physical therapy, as well as a pessary.  Surgical options include a midurethral sling, and transurethral injection of a bulking agent. - Not a good candidate for a sling due to pelvic radiation and scarring, but [name] is not interested in surgery at this time anyway.  - Will start with pelvic PT. We discussed that if [name] is not seeing improvement then urodynamics may be helpful to better assess her leakage since [name] is not always aware it is happening.   3. Vaginal atrophy -  Prescribed vaginal estrogen- estrace 0.5g nightly for two weeks then twice a week after - [name] is interested in sexual activity but has significant atrophy, a small introitus and short vaginal length. We discussed possibility of using vaginal dilators along with pelvic physical therapy in order to prepare for penetration.  - Also discussed how using estrogen cream regularly can help prevent urinary tract infections as well.   Return 6 weeks   Marguerita Beards, MD

## 2022-10-11 NOTE — Patient Instructions (Addendum)
Today we talked about ways to manage bladder urgency such as altering your diet to avoid irritative beverages and foods (bladder diet) as well as attempting to decrease stress and other exacerbating factors.  Avoid drinking 2-3 hours prior to bedtime.   The Most Bothersome Foods* The Least Bothersome Foods*  Coffee - Regular & Decaf Tea - caffeinated Carbonated beverages - cola, non-colas, diet & caffeine-free Alcohols - Beer, Red Wine, White Wine, [address] - Grapefruit, [place], Orange, [employer] - Cranberry, Grapefruit, Orange, Pineapple Vegetables - Tomato & Tomato Products Flavor Enhancers - Hot peppers, Spicy foods, Chili, Horseradish, Vinegar, Monosodium glutamate (MSG) Artificial Sweeteners - NutraSweet, Sweet 'N Low, Equal (sweetener), Saccharin Ethnic foods - [place], [place], [place] food [employer] - low-fat & whole Fruits - Bananas, Blueberries, Honeydew melon, Pears, Raisins, Watermelon Vegetables - Broccoli, [address] Sprouts, [place], Carrots, Cauliflower, [place], Cucumber, Mushrooms, Peas, Radishes, Squash, Zucchini, White potatoes, Sweet potatoes & yams Poultry - Chicken, Eggs, [place], [employer] - Beef, Diplomatic Services operational officer, Lamb Seafood - Shrimp, [place] fish, Salmon Grains - Oat, Rice Snacks - Pretzels, Popcorn  *Lenward Chancellor et al. Diet and its role in interstitial cystitis/bladder pain syndrome (IC/BPS) and comorbid conditions. [employer]. BJU Int. 2012 Jan 11.    Start vaginal estrogen therapy nightly for two weeks then 2 times weekly at night for treatment of vaginal atrophy (dryness of the vaginal tissues).  Please let us know if the prescription is too expensive and we can look for alternative options.    For treatment of stress urinary incontinence, which is leakage with physical activity/movement/strainging/coughing, we discussed expectant management versus nonsurgical options versus surgery. Nonsurgical options include weight loss, physical therapy, as well as a  pessary.  Surgical options include a midurethral sling, which is a synthetic mesh sling that acts like a hammock under the urethra to prevent leakage of urine, and transurethral injection of a bulking agent.  We discussed the symptoms of overactive bladder (OAB), which include urinary urgency, urinary frequency, night-time urination, with or without urge incontinence.  We discussed management including behavioral therapy (decreasing bladder irritants by following a bladder diet, urge suppression strategies, timed voids, bladder retraining), physical therapy, medication; and for refractory cases posterior tibial nerve stimulation, sacral neuromodulation, and intravesical botulinum toxin injection.   We have prescribed Myrbetriq 25mg  daily for your bladder.

## 2022-10-13 LAB — URINE CULTURE: Culture: 20000 — AB

## 2022-10-16 ENCOUNTER — Ambulatory Visit (INDEPENDENT_AMBULATORY_CARE_PROVIDER_SITE_OTHER): Payer: Medicare Other | Admitting: Family Medicine

## 2022-10-16 ENCOUNTER — Encounter: Payer: Self-pay | Admitting: Family Medicine

## 2022-10-16 VITALS — BP 115/74 | HR 78 | Temp 97.3°F | Ht 59.6 in | Wt 129.6 lb

## 2022-10-16 DIAGNOSIS — R110 Nausea: Secondary | ICD-10-CM | POA: Diagnosis not present

## 2022-10-16 DIAGNOSIS — N898 Other specified noninflammatory disorders of vagina: Secondary | ICD-10-CM | POA: Diagnosis not present

## 2022-10-16 DIAGNOSIS — R300 Dysuria: Secondary | ICD-10-CM

## 2022-10-16 LAB — URINALYSIS, ROUTINE W REFLEX MICROSCOPIC
Bilirubin, UA: NEGATIVE
Glucose, UA: NEGATIVE
Ketones, UA: NEGATIVE
Nitrite, UA: NEGATIVE
Specific Gravity, UA: 1.025 (ref 1.005–1.030)
Urobilinogen, Ur: 0.2 mg/dL (ref 0.2–1.0)
pH, UA: 6 (ref 5.0–7.5)

## 2022-10-16 LAB — WET PREP FOR TRICH, YEAST, CLUE
Clue Cell Exam: NEGATIVE
Trichomonas Exam: NEGATIVE
Yeast Exam: NEGATIVE

## 2022-10-16 LAB — MICROSCOPIC EXAMINATION: Renal Epithel, UA: NONE SEEN /hpf

## 2022-10-16 MED ORDER — ONDANSETRON HCL 4 MG PO TABS: 4.0000 mg | ORAL_TABLET | Freq: Three times a day (TID) | ORAL | 0 refills | Status: DC | PRN

## 2022-10-16 NOTE — Progress Notes (Signed)
Subjective:  Patient ID: [name], female    DOB: December 27, 1955, 67 y.o.   MRN: [id number]  Patient Care Team: [name], DO as PCP - General (Family Medicine)   Chief Complaint:  Nausea (X 1 week )   HPI: [name] is a 67 y.o. female presenting on 10/16/2022 for Nausea (X 1 week )   Pt presents today with complaints of nausea for a week. Associates this with her Vertigo. States she is taking meclizine with minimal relief of symptoms. Denies worsening vertigo symptoms or new neurological symptoms. States her stomach just feels queasy. No vomiting, abdominal pain, reflux symptoms, constipation, diarrhea, melena, or hematochezia. No new medications or foods.  She also reports she woke up this morning with slight dysuria and vaginal itching. No other associated symptoms.       Relevant past medical, surgical, family, and social history reviewed and updated as indicated.  Allergies and medications reviewed and updated. Data reviewed: Chart in Epic.   Past Medical History:  Diagnosis Date   Atrophic kidney    Cancer (HCC)    cervical 2003   Colovaginal fistula    Pyelonephritis    Vertigo     Past Surgical History:  Procedure Laterality Date   ABDOMINAL HYSTERECTOMY     cervical cancer   CHOLECYSTECTOMY     Colovaginal fistula repair  2019   low anterior resection with colovaginal fistula takedown, fistula repair, loop ileostomy   CYSTOSCOPY  2021   EXPLORATORY LAPAROTOMY     SBO   ILEOSTOMY     2019   ileostomy reversal      Social History   Socioeconomic History   Marital status: Married    Spouse name: Not on file   Number of children: 1   Years of education: Not on file   Highest education level: Not on file  Occupational History   Occupation: retired    Comment: Conservation officer, 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  Tobacco Use   Smoking status: Former    Packs/day: .15    Types: Cigarettes   Smokeless tobacco: Never  Vaping Use   Vaping Use: Never used   Substance and Sexual Activity   Alcohol use: Not Currently   Drug use: No   Sexual activity: Not Currently  Other Topics Concern   Not on file  Social History Narrative   Patient is a retired Conservation officer, 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   estradiol (ESTRACE) 0.1 MG/GM vaginal cream Place 0.5g nightly for two weeks then twice a week after   ibuprofen (ADVIL) 600 MG tablet Take 1 tablet (600 mg total) by mouth every 8 (eight) hours as needed.   levothyroxine (SYNTHROID) 25 MCG tablet Take 1 tablet (25 mcg total) by mouth daily.   meclizine (ANTIVERT) 12.5 MG tablet Take 1 tablet (12.5 mg total) by mouth 3 (three) times daily as needed for dizziness.  mirabegron ER (MYRBETRIQ) 25 MG TB24 tablet Take 1 tablet (25 mg total) by mouth daily.   simvastatin (ZOCOR) 20 MG tablet Take 1 tablet (20 mg total) by mouth daily at 6 PM.   fluticasone (FLONASE) 50 MCG/ACT nasal spray Place 2 sprays into both nostrils daily for 14 days.   ondansetron (ZOFRAN) 4 MG tablet Take 1 tablet (4 mg total) by mouth every 8 (eight) hours as needed for nausea or vomiting.   [DISCONTINUED] ondansetron (ZOFRAN) 4 MG tablet Take 1 tablet (4 mg total) by mouth every 8 (eight) hours as needed for nausea or vomiting. (Patient not taking: Reported on 10/16/2022)   No facility-administered encounter medications on file as of 10/16/2022.    Allergies  Allergen Reactions   Barium-Containing Compounds     hives    Percocet [Oxycodone-Acetaminophen] Other (See Comments)    migraine     Review of Systems  Constitutional:  Negative for activity change, appetite change, chills, diaphoresis, fatigue, fever and unexpected weight change.  Respiratory:  Negative for cough and shortness of breath.   Cardiovascular:  Negative for chest pain, palpitations and leg swelling.  Gastrointestinal:  Positive for nausea. Negative for abdominal distention, abdominal pain, anal bleeding, blood in stool, constipation, diarrhea, rectal pain and vomiting.  Genitourinary:  Positive for dysuria. Negative for decreased urine volume, difficulty urinating, enuresis, flank pain, frequency, genital sores, hematuria, menstrual problem, pelvic pain, urgency, vaginal bleeding, vaginal discharge and vaginal pain.       Vaginal pruritus  Neurological:  Positive for dizziness. Negative for tremors, seizures, syncope, facial asymmetry, speech difficulty, weakness, light-headedness, numbness and headaches.  Psychiatric/Behavioral:  Negative for confusion.   All other systems reviewed and are negative.       Objective:  BP 115/74   Pulse 78   Temp (!) 97.3 F (36.3 C) (Temporal)   Ht 4' 11.6" (1.514 m)   Wt 129 lb 9.6 oz (58.8 kg)   SpO2 95%   BMI 25.65 kg/m    Wt Readings from Last 3 Encounters:  10/16/22 129 lb 9.6 oz (58.8 kg)  10/11/22 128 lb 6.4 oz (58.2 kg)  09/23/22 124 lb (56.2 kg)    Physical Exam Vitals and nursing note reviewed.  Constitutional:      General: She is not in acute distress.    Appearance: Normal appearance. She is well-developed, well-groomed and normal weight. She is not ill-appearing, toxic-appearing or diaphoretic.  HENT:     Head: Normocephalic and atraumatic.     Jaw: There is normal jaw occlusion.     Right Ear: Hearing, tympanic membrane, ear canal and external ear normal.     Left Ear: Hearing, tympanic membrane, ear canal and external ear normal.     Nose: Nose normal.      Mouth/Throat:     Lips: Pink.     Mouth: Mucous membranes are moist.     Pharynx: Oropharynx is clear. Uvula midline.  Eyes:     General: Lids are normal.     Extraocular Movements: Extraocular movements intact.     Right eye: Normal extraocular motion and no nystagmus.     Left eye: Normal extraocular motion and no nystagmus.     Conjunctiva/sclera: Conjunctivae normal.     Pupils: Pupils are equal, round, and reactive to light.  Neck:     Thyroid: No thyroid mass, thyromegaly or thyroid tenderness.     Vascular: No carotid bruit or JVD.     Trachea: Trachea and phonation normal.  Cardiovascular:     Rate and Rhythm: Normal rate and regular rhythm.     Chest Wall: PMI is not displaced.     Pulses: Normal pulses.     Heart sounds: Normal heart sounds. No murmur heard.    No friction rub. No gallop.  Pulmonary:     Effort: Pulmonary effort is normal. No respiratory distress.     Breath sounds: Normal breath sounds. No wheezing.  Abdominal:     General: Bowel sounds are normal. There is no distension or abdominal bruit.     Palpations: Abdomen is soft. There is no hepatomegaly, splenomegaly or mass.     Tenderness: There is no abdominal tenderness. There is no right CVA tenderness, left CVA tenderness, guarding or rebound.     Hernia: No hernia is present.  Musculoskeletal:        General: Normal range of motion.     Cervical back: Normal range of motion and neck supple.     Right lower leg: No edema.     Left lower leg: No edema.  Lymphadenopathy:     Cervical: No cervical adenopathy.  Skin:    General: Skin is warm and dry.     Capillary Refill: Capillary refill takes less than 2 seconds.     Coloration: Skin is not cyanotic, jaundiced or pale.     Findings: No rash.  Neurological:     General: No focal deficit present.     Mental Status: She is alert and oriented to person, place, and time.     Cranial Nerves: No cranial nerve deficit.     Sensory: Sensation is intact.  No sensory deficit.     Motor: Motor function is intact. No weakness.     Coordination: Coordination is intact. Coordination normal.     Gait: Gait is intact. Gait normal.     Deep Tendon Reflexes: Reflexes are normal and symmetric. Reflexes normal.  Psychiatric:        Attention and Perception: Attention and perception normal.        Mood and Affect: Mood and affect normal.        Speech: Speech normal.        Behavior: Behavior normal. Behavior is cooperative.        Thought Content: Thought content normal.        Cognition and Memory: Cognition and memory normal.        Judgment: Judgment normal.     Results for orders placed or performed during the hospital encounter of 10/11/22  Urine Culture   Specimen: Urine, Random  Result Value Ref Range   Specimen Description URINE, RANDOM    Special Requests      NONE Performed at [facility], [address] [place], [place] [address]    Culture 20,000 COLONIES/mL STAPHYLOCOCCUS EPIDERMIDIS (A)    Report Status 10/13/2022 FINAL    Organism ID, Bacteria STAPHYLOCOCCUS EPIDERMIDIS (A)       Susceptibility   Staphylococcus epidermidis - MIC*    CIPROFLOXACIN 4 RESISTANT Resistant     GENTAMICIN <=0.5 SENSITIVE Sensitive     NITROFURANTOIN <=16 SENSITIVE Sensitive     OXACILLIN >=4 RESISTANT Resistant     TETRACYCLINE <=1 SENSITIVE Sensitive     VANCOMYCIN 1 SENSITIVE Sensitive     TRIMETH/SULFA 160 RESISTANT Resistant     CLINDAMYCIN RESISTANT Resistant     RIFAMPIN <=0.5 SENSITIVE Sensitive     Inducible Clindamycin POSITIVE Resistant     *  20,000 COLONIES/mL STAPHYLOCOCCUS EPIDERMIDIS       Pertinent labs & imaging results that were available during my care of the patient were reviewed by me and considered in my medical decision making.  Assessment & Plan:  Elisse was seen today for nausea.  Diagnoses and all orders for this visit:  Nausea in adult Associates this with vertigo. Is taking meclizine with minimal  relief of symptoms. Offered referral to PT for vestibular rehab, pt declined. Will provide with short course of Zofran. Pt aware to report new, worsening, or persistent symptoms.  -     ondansetron (ZOFRAN) 4 MG tablet; Take 1 tablet (4 mg total) by mouth every 8 (eight) hours as needed for nausea or vomiting.  Vaginal pruritus Dysuria  Wet prep negative. Urinalysis with 1+ leukocytes. Culture added, will treat if warranted.  -     WET PREP FOR TRICH, YEAST, CLUE -     Urinalysis, Routine w reflex microscopic -     Urine Culture     Continue all other maintenance medications.  Follow up plan: Return if symptoms worsen or fail to improve.   Continue healthy lifestyle choices, including diet (rich in fruits, vegetables, and lean proteins, and low in salt and simple carbohydrates) and exercise (at least 30 minutes of moderate physical activity daily).  Educational handout given for vertigo  The above assessment and management plan was discussed with the patient. The patient verbalized understanding of and has agreed to the management plan. Patient is aware to call the clinic if they develop any new symptoms or if symptoms persist or worsen. Patient is aware when to return to the clinic for a follow-up visit. Patient educated on when it is appropriate to go to the emergency department.   Kari Baars, FNP-C [facility] [phone]

## 2022-10-19 ENCOUNTER — Encounter (HOSPITAL_COMMUNITY): Payer: Self-pay

## 2022-10-19 ENCOUNTER — Emergency Department (HOSPITAL_COMMUNITY)
Admission: EM | Admit: 2022-10-19 | Discharge: 2022-10-19 | Disposition: A | Discharge status: Home / Self Care | Payer: Medicare Other | Attending: Emergency Medicine | Admitting: Emergency Medicine

## 2022-10-19 ENCOUNTER — Other Ambulatory Visit: Payer: Self-pay

## 2022-10-19 DIAGNOSIS — N3001 Acute cystitis with hematuria: Secondary | ICD-10-CM | POA: Insufficient documentation

## 2022-10-19 DIAGNOSIS — Z8541 Personal history of malignant neoplasm of cervix uteri: Secondary | ICD-10-CM | POA: Diagnosis not present

## 2022-10-19 DIAGNOSIS — R300 Dysuria: Secondary | ICD-10-CM | POA: Diagnosis present

## 2022-10-19 LAB — URINALYSIS, ROUTINE W REFLEX MICROSCOPIC
Bilirubin Urine: NEGATIVE
Glucose, UA: NEGATIVE mg/dL
Ketones, ur: NEGATIVE mg/dL
Nitrite: NEGATIVE
Protein, ur: >=300 mg/dL — AB
RBC / HPF: >50 RBC/hpf (ref 0–5)
Specific Gravity, Urine: 1.014 (ref 1.005–1.030)
WBC, UA: >50 WBC/hpf (ref 0–5)
pH: 6 (ref 5.0–8.0)

## 2022-10-19 LAB — URINE CULTURE

## 2022-10-19 MED ORDER — AMOXICILLIN-POT CLAVULANATE 875-125 MG PO TABS: 1.0000 | ORAL_TABLET | Freq: Two times a day (BID) | ORAL | 0 refills | Status: DC

## 2022-10-19 MED ORDER — AMOXICILLIN-POT CLAVULANATE 875-125 MG PO TABS
1.0000 | ORAL_TABLET | Freq: Once | ORAL | Status: AC
  Administered 2022-10-19: 1 via ORAL
  Filled 2022-10-19: qty 1

## 2022-10-19 MED ORDER — ONDANSETRON 4 MG PO TBDP
4.0000 mg | ORAL_TABLET | Freq: Once | ORAL | Status: AC
  Administered 2022-10-19: 4 mg via ORAL
  Filled 2022-10-19: qty 1

## 2022-10-19 MED ORDER — AMOXICILLIN-POT CLAVULANATE 875-125 MG PO TABS: 1.0000 | ORAL_TABLET | Freq: Two times a day (BID) | ORAL | 0 refills | Status: AC

## 2022-10-19 NOTE — Discharge Instructions (Addendum)
It was a pleasure taking care of you today.  You are having worsening UTI symptoms.  You had a culture a couple of days ago that showed gram-negative rods, we are treating you with antibiotics today.  Follow-up with your urologist office.  Call them to let them know that we started you on antibiotics and get follow-up.  Come back if you have fever, abdominal pain, back or flank pain, vomiting or other worsening symptoms.

## 2022-10-19 NOTE — ED Provider Notes (Signed)
[facility] AT [facility] Provider Note   CSN: [id number] Arrival date & time: 10/19/22  1409     History  Chief Complaint  Patient presents with   Dysuria    [name] is a 67 y.o. female.  PMH of cervical cancer and rectovaginal fistula which is since healed.  She has been having frequent UTIs for the past year, she started seeing urogynecology several days ago and was having some UTI symptoms at that time.  They started her on Myrbetriq and send urine for culture.  Since yesterday patient is having increased frequency dysuria and foul-smelling.  She states is cloudy and quite, feels exactly like prior UTIs.  She denies fevers or chills, reports some mild suprapubic discomfort and nausea but no vomiting, no back or flank pain.  No chest pain shortness of breath or other symptoms.   Dysuria      Home Medications Prior to Admission medications   Medication Sig Start Date End Date Taking? Authorizing Provider  amoxicillin-clavulanate (AUGMENTIN) 875-125 MG tablet Take 1 tablet by mouth every 12 (twelve) hours for 7 days. 10/19/22 10/26/22  Carmel Sacramento A, PA-C  cetirizine (ZYRTEC ALLERGY) 10 MG tablet Take 1 tablet (10 mg total) by mouth daily. 08/30/22   Gailen Shelter, PA  estradiol (ESTRACE) 0.1 MG/GM vaginal cream Place 0.5g nightly for two weeks then twice a week after 10/14/22   Marguerita Beards, MD  fluticasone [facility]) 50 MCG/ACT nasal spray Place 2 sprays into both nostrils daily for 14 days. 08/30/22 09/13/22  Gailen Shelter, PA  ibuprofen (ADVIL) 600 MG tablet Take 1 tablet (600 mg total) by mouth every 8 (eight) hours as needed. 05/07/22   Daryll Drown, NP  levothyroxine (SYNTHROID) 25 MCG tablet Take 1 tablet (25 mcg total) by mouth daily. 07/17/22   [name], DO  meclizine (ANTIVERT) 12.5 MG tablet Take 1 tablet (12.5 mg total) by mouth 3 (three) times daily as needed for dizziness. 05/07/22   Daryll Drown, NP   mirabegron ER (MYRBETRIQ) 25 MG TB24 tablet Take 1 tablet (25 mg total) by mouth daily. 10/11/22   Marguerita Beards, MD  ondansetron (ZOFRAN) 4 MG tablet Take 1 tablet (4 mg total) by mouth every 8 (eight) hours as needed for nausea or vomiting. 10/16/22   Sonny Masters, FNP  simvastatin (ZOCOR) 20 MG tablet Take 1 tablet (20 mg total) by mouth daily at 6 PM. 09/23/22   [name], DO      Allergies    Barium-containing compounds and Percocet [oxycodone-acetaminophen]    Review of Systems   Review of Systems  Genitourinary:  Positive for dysuria.    Physical Exam Updated Vital Signs BP 129/78 (BP Location: Right Arm)   Pulse 79   Temp 99 F (37.2 C) (Oral)   Resp 18   Ht 5\' 2"  (1.575 m)   Wt 58.5 kg   SpO2 97%   BMI 23.59 kg/m  Physical Exam Vitals and nursing note reviewed.  Constitutional:      General: She is not in acute distress.    Appearance: She is well-developed.  HENT:     Head: Normocephalic and atraumatic.  Eyes:     Conjunctiva/sclera: Conjunctivae normal.  Cardiovascular:     Rate and Rhythm: Normal rate and regular rhythm.     Heart sounds: No murmur heard. Pulmonary:     Effort: Pulmonary effort is normal. No respiratory distress.  Breath sounds: Normal breath sounds.  Abdominal:     Palpations: Abdomen is soft.     Tenderness: There is abdominal tenderness in the suprapubic area. There is no right CVA tenderness, left CVA tenderness, guarding or rebound.  Musculoskeletal:        General: No swelling.     Cervical back: Neck supple.  Skin:    General: Skin is warm and dry.     Capillary Refill: Capillary refill takes less than 2 seconds.  Neurological:     Mental Status: She is alert.  Psychiatric:        Mood and Affect: Mood normal.     ED Results / Procedures / Treatments   Labs (all labs ordered are listed, but only abnormal results are displayed) Labs Reviewed  URINALYSIS, ROUTINE W REFLEX MICROSCOPIC - Abnormal; Notable  for the following components:      Result Value   APPearance TURBID (*)    Hgb urine dipstick LARGE (*)    Protein, ur >=300 (*)    Leukocytes,Ua LARGE (*)    Bacteria, UA FEW (*)    All other components within normal limits    EKG None  Radiology No results found.  Procedures Procedures    Medications Ordered in ED Medications  amoxicillin-clavulanate (AUGMENTIN) 875-125 MG per tablet 1 tablet (has no administration in time range)  ondansetron (ZOFRAN-ODT) disintegrating tablet 4 mg (has no administration in time range)    ED Course/ Medical Decision Making/ A&P                             Medical Decision Making This patient presents to the ED for concern of UTI, this involves an extensive number of treatment options, and is a complaint that carries with it a high risk of complications and morbidity.  The differential diagnosis includes UTI, pyelonephritis, nephrolithiasis, asymptomatic bacteriuria, interstitial cystitis, other   Co morbidities that complicate the patient evaluation  Recurrent UTIs, prior rectovaginal fistula   Additional history obtained:  Additional history obtained from EMR External records from outside source obtained and reviewed including prior culture results   Lab Tests:  I Ordered, and personally interpreted labs.  The pertinent results include: Urinalysis shows large hemoglobin, large leukocytes, greater than 50 red blood cells greater than 50 white blood cells and few bacteria with white blood cell clumps.  This is worsened from urinalysis on 5/1.   Problem List / ED Course / Critical interventions / Medication management  Patient had urine culture with gram-negative rods, has had prior Klebsiella that was resistant to Macrobid.  She states the past she has had Augmentin for UTIs and did well with this.  Her urine culture previous that showed Klebsiella sensitive to Augmentin.  She is well-appearing well-hydrated with normal vitals.   Discussed outpatient follow-up with her urogynecologist and return precautions. I ordered medication including augmentin  for uti  Reevaluation of the patient after these medicines showed that the patient stayed the same I have reviewed the patients home medicines and have made adjustments as needed       Amount and/or Complexity of Data Reviewed Labs: ordered.  Risk Prescription drug management.           Final Clinical Impression(s) / ED Diagnoses Final diagnoses:  Acute cystitis with hematuria    Rx / DC Orders ED Discharge Orders          Ordered    amoxicillin-clavulanate (AUGMENTIN)  875-125 MG tablet  Every 12 hours,   Status:  Discontinued        10/19/22 1516    amoxicillin-clavulanate (AUGMENTIN) 875-125 MG tablet  Every 12 hours        10/19/22 1519              Ma Rings, PA-C 10/19/22 1526    Cathren Laine, MD 10/20/22 [phone]

## 2022-10-19 NOTE — ED Triage Notes (Addendum)
Pt presents with recurrent urinary symptoms of dysuria, cloudy urine, vaginal itching, and frequency x 2 days. Pt states she has had 5 episodes since the beginning of the year. Pt also reports 2 month of lower back pain that is worse with activity and movement.

## 2022-10-22 ENCOUNTER — Telehealth: Payer: Self-pay | Admitting: Obstetrics and Gynecology

## 2022-10-22 DIAGNOSIS — N390 Urinary tract infection, site not specified: Secondary | ICD-10-CM

## 2022-10-22 DIAGNOSIS — N952 Postmenopausal atrophic vaginitis: Secondary | ICD-10-CM

## 2022-10-22 MED ORDER — NITROFURANTOIN MONOHYD MACRO 100 MG PO CAPS: 100.0000 mg | ORAL_CAPSULE | Freq: Every day | ORAL | 1 refills | Status: DC

## 2022-10-22 NOTE — Telephone Encounter (Signed)
patient stated she can not use Estrace cream because of cancer history.  She said that the printed information said to not use if you have a history of cancer.  She said "I'm throwing it in the trash."  Would like a call back to discuss.

## 2022-10-22 NOTE — Telephone Encounter (Signed)
Spoke to patient who was audibly distressed on the phone.   She reports she is upset because of the black box warning on the estrogen cream, and as she has a history of cervical cancer was concerned and wanted to throw it in the trash. We discussed that the cream does not go systemically and is safe for her to use and is important in prevention of UTI a it helps provide tissue support and reduce risk of bacteria getting into the urethra. She is open to trying the cream and will use it nightly for two weeks and then twice weekly after.  She reports she has had another UTI since her initial visit and that the urine was dark brown in color and she has concerns about her frequent UTI's and feels like this has not be addressed. She is currently on antibiotics for UTI. We discussed starting a low dose daily antibiotic to reduce risk of future UTI. She has had cultures before that are resistant to Bactrim so will send in Macrobid 100mg  daily for patient to take for 6 months as a preventative. She has a follow up with me in June and we can evaluate how this has been working for patient.

## 2022-10-28 ENCOUNTER — Telehealth: Payer: Self-pay | Admitting: *Deleted

## 2022-10-28 NOTE — Progress Notes (Signed)
  Care Coordination   Note   10/28/2022 Name: [name] MRN: [id number] DOB: Jun 21, 1955  [name] is a 67 y.o. year old female who sees [name], DO for primary care. I reached out to [name] by phone today to offer care coordination services.  Ms. Radka was given information about Care Coordination services today including:   The Care Coordination services include support from the care team which includes your Nurse Coordinator, Clinical Social Worker, or Pharmacist.  The Care Coordination team is here to help remove barriers to the health concerns and goals most important to you. Care Coordination services are voluntary, and the patient may decline or stop services at any time by request to their care team member.   Care Coordination Consent Status: Patient agreed to services and verbal consent obtained.   Follow up plan:  Telephone appointment with care coordination team member scheduled for:  11/04/22  Encounter Outcome:  Pt. Scheduled  [facility] Coordination Care Guide  Direct Dial: [phone]

## 2022-11-04 ENCOUNTER — Ambulatory Visit: Payer: Self-pay | Admitting: *Deleted

## 2022-11-04 DIAGNOSIS — L639 Alopecia areata, unspecified: Secondary | ICD-10-CM | POA: Insufficient documentation

## 2022-11-04 DIAGNOSIS — L650 Telogen effluvium: Secondary | ICD-10-CM | POA: Insufficient documentation

## 2022-11-04 DIAGNOSIS — L219 Seborrheic dermatitis, unspecified: Secondary | ICD-10-CM | POA: Insufficient documentation

## 2022-11-04 DIAGNOSIS — D692 Other nonthrombocytopenic purpura: Secondary | ICD-10-CM | POA: Insufficient documentation

## 2022-11-04 DIAGNOSIS — L649 Androgenic alopecia, unspecified: Secondary | ICD-10-CM | POA: Insufficient documentation

## 2022-11-04 NOTE — Patient Outreach (Unsigned)
  Care Coordination   Initial Visit Note   11/06/2022 late entry for 11/04/22 Name: [name] MRN: [id number] DOB: 1955-12-09  [name] is a 67 y.o. year old female who sees [name], DO for primary care. I spoke with  [name] by phone today.  What matters to the patients health and wellness today?  uro gynecology services, vaginal/pelvic floor therapy Does not prefer to use the Estrace, estrogen cream offered to her by Netta Corrigan, urogynecology DNP, related to concerns with her history of cancer PMH of frequent UTI's  Voiced understanding of Estrace cream not absorbing through her vaginal area, pelvic organ (uterus, bladder, rectum) prolapse, UTIs and other treatments to assist with pelvic floor dysfunction/pelvic floor prolapse/urinary incontinence Voiced understanding of pelvic floor therapy.exercises, medications and minimally invasive surgical procedures. She shared a case related to a family/friend.  She shares her concerns with urinary incontinence after the birth of her children She asked questions She shared her fear of any procedure/process that may increase her risk of further cancer illnesses plus being  uncomfortable with probing of her "privates by anyone other than my husband" She voiced understanding of her patient rights for treatment   Goals Addressed             This Visit's Progress    [facility] care coordination services   Not on track    Interventions Today    Flowsheet Row Most Recent Value  Chronic Disease   Chronic disease during today's visit Other  General Interventions   General Interventions Discussed/Reviewed General Interventions Discussed, [facility], Doctor Visits, Communication with  Doctor Visits Discussed/Reviewed Doctor Visits Discussed  Communication with --  [sent a messages to [employer] that education on estrace cream was offered to patient Requested possible brochures etc. to be sent to the patient about the treatment  plan to help with her lack of knowledge abour urogynecology options]  Exercise Interventions   Exercise Discussed/Reviewed Exercise Discussed, Physical Activity  Physical Activity Discussed/Reviewed Physical Activity Reviewed  Education Interventions   Education Provided --  [articles on pelvic floor dysfunctions, pelvic prolapse, Kegel, urethral vaginal sling urinary incontinence Brochure for pelvic floor therapy]  Provided Verbal Education On --  [Nonadsorbing Estrace vaginal cream, pelvic organ (uterus,bladder,rectum) prolapses, UTIs &. Exercises, medications minimal probing & minimally invasive surgical procedures successes of urogynecology,]  Mental Health Interventions   Mental Health Discussed/Reviewed Mental Health Discussed, Coping Strategies  Nutrition Interventions   Nutrition Discussed/Reviewed Nutrition Discussed, Fluid intake  Pharmacy Interventions   Pharmacy Dicussed/Reviewed Pharmacy Topics Discussed, Medication Adherence  Medication Adherence Not taking medication  [not comfortable with use of Estrace cream]              SDOH assessments and interventions completed:  Yes  SDOH Interventions Today    Flowsheet Row Most Recent Value  SDOH Interventions   Food Insecurity Interventions Intervention Not Indicated  Housing Interventions Intervention Not Indicated  Transportation Interventions Intervention Not Indicated  Utilities Interventions Intervention Not Indicated  Financial Strain Interventions Intervention Not Indicated  Stress Interventions Intervention Not Indicated        Care Coordination Interventions:  Yes, provided   Follow up plan: Follow up call scheduled for 12/05/22    Encounter Outcome:  Pt. Visit Completed   Rozella Servello L. Noelle Penner, RN, BSN, CCM [facility] Care Management Community Coordinator Office number [phone]

## 2022-11-06 NOTE — Patient Instructions (Addendum)
Visit Information  Thank you for taking time to visit with me today. Please don't hesitate to contact me if I can be of assistance to you.   Following are the goals we discussed today:   Goals Addressed             This Visit's Progress    [facility] care coordination services   Not on track    Interventions Today    Flowsheet Row Most Recent Value  Chronic Disease   Chronic disease during today's visit Other  General Interventions   General Interventions Discussed/Reviewed General Interventions Discussed, [facility], Doctor Visits, Communication with  Doctor Visits Discussed/Reviewed Doctor Visits Discussed  Communication with --  [sent a messages to [employer] that education on estrace cream was offered to patient Requested possible brochures etc. to be sent to the patient about the treatment plan to help with her lack of knowledge abour urogynecology options]  Exercise Interventions   Exercise Discussed/Reviewed Exercise Discussed, Physical Activity  Physical Activity Discussed/Reviewed Physical Activity Reviewed  Education Interventions   Education Provided --  [articles on pelvic floor dysfunctions, pelvic prolapse, Kegel, urethral vaginal sling urinary incontinence Brochure for pelvic floor therapy]  Provided Verbal Education On --  [Nonadsorbing Estrace vaginal cream, pelvic organ (uterus,bladder,rectum) prolapses, UTIs &. Exercises, medications minimal probing & minimally invasive surgical procedures successes of urogynecology,]  Mental Health Interventions   Mental Health Discussed/Reviewed Mental Health Discussed, Coping Strategies  Nutrition Interventions   Nutrition Discussed/Reviewed Nutrition Discussed, Fluid intake  Pharmacy Interventions   Pharmacy Dicussed/Reviewed Pharmacy Topics Discussed, Medication Adherence  Medication Adherence Not taking medication  [not comfortable with use of Estrace cream]              Our next appointment is by telephone on  12/05/22 at 1:30 pm  Please call the care guide team at [phone] if you need to cancel or reschedule your appointment.   If you are experiencing a Mental Health or Behavioral Health Crisis or need someone to talk to, please call the Suicide and Crisis Lifeline: 988 call the [place] National Suicide Prevention Lifeline: (639)839-2718 or TTY: 432-677-0351 TTY 787-675-5556) to talk to a trained counselor call 1-800-273-TALK (toll free, 24 hour hotline) call the [facility]: 906-185-5055 call 911   The patient verbalized understanding of instructions, educational materials, and care plan provided today and DECLINED offer to receive copy of patient instructions, educational materials, and care plan.   The patient has been provided with contact information for the care management team and has been advised to call with any health related questions or concerns.   Jamiaya Bina L. Noelle Penner, RN, BSN, CCM [facility] Care Management Community Coordinator Office number [phone]

## 2022-11-07 ENCOUNTER — Ambulatory Visit: Payer: Self-pay | Admitting: Obstetrics and Gynecology

## 2022-11-07 NOTE — Progress Notes (Signed)
Care coordination reached out to inform that patient felt unsure about care plan. Upon reviewing care course, I realized patient has canceled her follow up appointment with me. I had recently spoken to patient on the phone for approximately 30 minutes in discussion of care plan, treatment options, and encouragement to give [place] a chance to assist in her care. She agreed at the time on the phone and reported understanding.   Upon message from [facility] Care coordinator, it was noted that patient had called and canceled her follow up appointment with me regarding recurrent UTIs and her overactive bladder symptoms. As I started her on daily low dose antibiotics and Dr. Florian Buff had started her on OAB medication and vaginal estrogen cream, there is a plan of care in place that should be followed and without follow up cannot be managed appropriately. While this can be detrimental to her care, we respect patient autonomy in the right to choose her provider and are here if she wishes to reschedule an appointment.

## 2022-11-17 ENCOUNTER — Encounter (HOSPITAL_COMMUNITY): Payer: Self-pay

## 2022-11-17 ENCOUNTER — Other Ambulatory Visit: Payer: Self-pay

## 2022-11-17 ENCOUNTER — Inpatient Hospital Stay (HOSPITAL_COMMUNITY)
Admission: EM | Admit: 2022-11-17 | Discharge: 2022-11-19 | DRG: 389 | Disposition: A | Discharge status: Home / Self Care | Payer: Medicare Other | Attending: Internal Medicine | Admitting: Internal Medicine

## 2022-11-17 ENCOUNTER — Emergency Department (HOSPITAL_COMMUNITY): Payer: Medicare Other

## 2022-11-17 DIAGNOSIS — F419 Anxiety disorder, unspecified: Secondary | ICD-10-CM | POA: Diagnosis present

## 2022-11-17 DIAGNOSIS — E782 Mixed hyperlipidemia: Secondary | ICD-10-CM | POA: Diagnosis not present

## 2022-11-17 DIAGNOSIS — K56600 Partial intestinal obstruction, unspecified as to cause: Principal | ICD-10-CM | POA: Diagnosis present

## 2022-11-17 DIAGNOSIS — Z833 Family history of diabetes mellitus: Secondary | ICD-10-CM

## 2022-11-17 DIAGNOSIS — Z8249 Family history of ischemic heart disease and other diseases of the circulatory system: Secondary | ICD-10-CM

## 2022-11-17 DIAGNOSIS — K56609 Unspecified intestinal obstruction, unspecified as to partial versus complete obstruction: Secondary | ICD-10-CM | POA: Diagnosis not present

## 2022-11-17 DIAGNOSIS — Z885 Allergy status to narcotic agent status: Secondary | ICD-10-CM

## 2022-11-17 DIAGNOSIS — N261 Atrophy of kidney (terminal): Secondary | ICD-10-CM | POA: Diagnosis present

## 2022-11-17 DIAGNOSIS — N179 Acute kidney failure, unspecified: Secondary | ICD-10-CM | POA: Diagnosis not present

## 2022-11-17 DIAGNOSIS — N1330 Unspecified hydronephrosis: Secondary | ICD-10-CM | POA: Diagnosis not present

## 2022-11-17 DIAGNOSIS — E876 Hypokalemia: Secondary | ICD-10-CM | POA: Diagnosis not present

## 2022-11-17 DIAGNOSIS — R1110 Vomiting, unspecified: Secondary | ICD-10-CM | POA: Diagnosis not present

## 2022-11-17 DIAGNOSIS — R1084 Generalized abdominal pain: Secondary | ICD-10-CM

## 2022-11-17 DIAGNOSIS — Z79890 Hormone replacement therapy (postmenopausal): Secondary | ICD-10-CM | POA: Diagnosis not present

## 2022-11-17 DIAGNOSIS — K5669 Other partial intestinal obstruction: Secondary | ICD-10-CM | POA: Diagnosis not present

## 2022-11-17 DIAGNOSIS — Z8541 Personal history of malignant neoplasm of cervix uteri: Secondary | ICD-10-CM | POA: Diagnosis not present

## 2022-11-17 DIAGNOSIS — N1832 Chronic kidney disease, stage 3b: Secondary | ICD-10-CM | POA: Diagnosis present

## 2022-11-17 DIAGNOSIS — N390 Urinary tract infection, site not specified: Secondary | ICD-10-CM | POA: Diagnosis present

## 2022-11-17 DIAGNOSIS — R112 Nausea with vomiting, unspecified: Secondary | ICD-10-CM

## 2022-11-17 DIAGNOSIS — I700 Atherosclerosis of aorta: Secondary | ICD-10-CM | POA: Diagnosis not present

## 2022-11-17 DIAGNOSIS — Z79899 Other long term (current) drug therapy: Secondary | ICD-10-CM

## 2022-11-17 DIAGNOSIS — Z888 Allergy status to other drugs, medicaments and biological substances status: Secondary | ICD-10-CM | POA: Diagnosis not present

## 2022-11-17 DIAGNOSIS — K6389 Other specified diseases of intestine: Secondary | ICD-10-CM | POA: Diagnosis not present

## 2022-11-17 DIAGNOSIS — R109 Unspecified abdominal pain: Principal | ICD-10-CM

## 2022-11-17 DIAGNOSIS — E785 Hyperlipidemia, unspecified: Secondary | ICD-10-CM | POA: Diagnosis present

## 2022-11-17 DIAGNOSIS — Z87891 Personal history of nicotine dependence: Secondary | ICD-10-CM | POA: Diagnosis not present

## 2022-11-17 DIAGNOSIS — E039 Hypothyroidism, unspecified: Secondary | ICD-10-CM | POA: Diagnosis present

## 2022-11-17 HISTORY — DX: Disorder of thyroid, unspecified: E07.9

## 2022-11-17 LAB — CBC WITH DIFFERENTIAL/PLATELET
Abs Immature Granulocytes: 0.02 10*3/uL (ref 0.00–0.07)
Basophils Absolute: 0 10*3/uL (ref 0.0–0.1)
Basophils Relative: 0 %
Eosinophils Absolute: 0 10*3/uL (ref 0.0–0.5)
Eosinophils Relative: 1 %
HCT: 41 % (ref 36.0–46.0)
Hemoglobin: 14.4 g/dL (ref 12.0–15.0)
Immature Granulocytes: 0 %
Lymphocytes Relative: 13 %
Lymphs Abs: 0.8 10*3/uL (ref 0.7–4.0)
MCH: 32.1 pg (ref 26.0–34.0)
MCHC: 35.1 g/dL (ref 30.0–36.0)
MCV: 91.5 fL (ref 80.0–100.0)
Monocytes Absolute: 0.5 10*3/uL (ref 0.1–1.0)
Monocytes Relative: 7 %
Neutro Abs: 4.9 10*3/uL (ref 1.7–7.7)
Neutrophils Relative %: 79 %
Platelets: 169 10*3/uL (ref 150–400)
RBC: 4.48 MIL/uL (ref 3.87–5.11)
RDW: 12.6 % (ref 11.5–15.5)
WBC: 6.2 10*3/uL (ref 4.0–10.5)
nRBC: 0 % (ref 0.0–0.2)

## 2022-11-17 LAB — URINALYSIS, ROUTINE W REFLEX MICROSCOPIC
Bilirubin Urine: NEGATIVE
Glucose, UA: NEGATIVE mg/dL
Ketones, ur: 20 mg/dL — AB
Nitrite: NEGATIVE
Protein, ur: 30 mg/dL — AB
Specific Gravity, Urine: 1.023 (ref 1.005–1.030)
pH: 5 (ref 5.0–8.0)

## 2022-11-17 LAB — COMPREHENSIVE METABOLIC PANEL
ALT: 13 U/L (ref 0–44)
AST: 15 U/L (ref 15–41)
Albumin: 4.4 g/dL (ref 3.5–5.0)
Alkaline Phosphatase: 79 U/L (ref 38–126)
Anion gap: 14 (ref 5–15)
BUN: 37 mg/dL — ABNORMAL HIGH (ref 8–23)
CO2: 25 mmol/L (ref 22–32)
Calcium: 9.6 mg/dL (ref 8.9–10.3)
Chloride: 98 mmol/L (ref 98–111)
Creatinine, Ser: 1.48 mg/dL — ABNORMAL HIGH (ref 0.44–1.00)
GFR, Estimated: 39 mL/min — ABNORMAL LOW (ref >60)
Glucose, Bld: 133 mg/dL — ABNORMAL HIGH (ref 70–99)
Potassium: 4.2 mmol/L (ref 3.5–5.1)
Sodium: 137 mmol/L (ref 135–145)
Total Bilirubin: 1.3 mg/dL — ABNORMAL HIGH (ref 0.3–1.2)
Total Protein: 7.7 g/dL (ref 6.5–8.1)

## 2022-11-17 LAB — LIPASE, BLOOD: Lipase: 22 U/L (ref 11–51)

## 2022-11-17 MED ORDER — ONDANSETRON HCL 4 MG/2ML IJ SOLN
4.0000 mg | Freq: Once | INTRAMUSCULAR | Status: AC
  Administered 2022-11-17: 4 mg via INTRAVENOUS
  Filled 2022-11-17: qty 2

## 2022-11-17 MED ORDER — SODIUM CHLORIDE 0.9 % IV BOLUS
1000.0000 mL | Freq: Once | INTRAVENOUS | Status: AC
  Administered 2022-11-17: 1000 mL via INTRAVENOUS

## 2022-11-17 MED ORDER — ENOXAPARIN SODIUM 30 MG/0.3ML IJ SOSY
30.0000 mg | PREFILLED_SYRINGE | INTRAMUSCULAR | Status: DC
  Administered 2022-11-17 – 2022-11-18 (×2): 30 mg via SUBCUTANEOUS
  Filled 2022-11-17 (×2): qty 0.3

## 2022-11-17 MED ORDER — SODIUM CHLORIDE 0.9 % IV BOLUS: 1000.0000 mL | Freq: Once | INTRAVENOUS | Status: DC

## 2022-11-17 MED ORDER — ONDANSETRON HCL 4 MG/2ML IJ SOLN
4.0000 mg | Freq: Four times a day (QID) | INTRAMUSCULAR | Status: DC | PRN
  Administered 2022-11-17: 4 mg via INTRAVENOUS
  Filled 2022-11-17: qty 2

## 2022-11-17 MED ORDER — DEXTROSE IN LACTATED RINGERS 5 % IV SOLN: INTRAVENOUS | Status: AC

## 2022-11-17 MED ORDER — IOHEXOL 300 MG/ML  SOLN
100.0000 mL | Freq: Once | INTRAMUSCULAR | Status: AC | PRN
  Administered 2022-11-17: 100 mL via INTRAVENOUS

## 2022-11-17 MED ORDER — HYDROMORPHONE HCL 1 MG/ML IJ SOLN: 0.5000 mg | INTRAMUSCULAR | Status: DC | PRN

## 2022-11-17 MED ORDER — ONDANSETRON HCL 4 MG PO TABS
4.0000 mg | ORAL_TABLET | Freq: Four times a day (QID) | ORAL | Status: DC | PRN
  Administered 2022-11-19: 4 mg via ORAL
  Filled 2022-11-17: qty 1

## 2022-11-17 NOTE — ED Provider Notes (Signed)
Patient transferred from [facility] for CT imaging and reassessment.  Patient reports last few days she has had nausea and vomiting and left-sided abdominal discomfort.  She has left flank pain.  She reports she is having some urinary changes and think she is starting to have a UTI.  She is on Macrobid chronically due to UTIs.  She reports some chills but has not reported fevers.  Patient had workup but needs CT imaging to rule out other complications such as kidney stones, pyelonephritis, abscess, or other problem.  Anticipate reassessment after CT imaging.  Patient still nauseous so we will give some nausea medicine.  6:04 PM CT scan returned showing partial small bowel obstruction.  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  Patient reports she is willing to be admitted to help as she is still having pain, nausea, and feels like her abdomen is hard on the left side.  Will call general surgery first but then will call for likely medicine admission.  6:05 PM I also began to review her chart and it appears that 2 years ago she had her most recent small bowel obstruction with an ex lap and extensive lysis of adhesions.  She reportedly had prolonged ileus and had to go on TPN.   6:17 PM To spoke to Dr. Corliss Skains with general surgery who says his team will see the patient likely in the morning.  He felt to hold on NG tube unless patient started vomiting significantly then place the NG tube.  She should remain n.p.o. aside from some few ice chips he requests and admit to medicine.  Will call medicine for admission.   Clinical Impression: 1. Left flank pain   2. Partial small bowel obstruction (HCC)   3. Nausea and vomiting, unspecified vomiting type   4. Generalized abdominal pain     Disposition: Admit  This note was prepared with assistance of Dragon voice recognition software. Occasional wrong-word or sound-a-like  substitutions may have occurred due to the inherent limitations of voice recognition software.     Elior Robinette, Canary Brim, MD 11/17/22 2024

## 2022-11-17 NOTE — Discharge Instructions (Signed)
You were seen in the [facility] ER for flank pain. It appears that you may still have a lingering infection present based on urine testing. Given presence of blood and flank pain, I would recommend further evaluation with CT scanning of the abdomen. Unfortunately this is not available at [facility] at the moment and will require you to drive yourself to [facility] ER.  Address: [address] [place], [place], [place] [address]

## 2022-11-17 NOTE — H&P (Signed)
History and Physical    Patient: [name] AOZ:[id number] DOB: September 15, 1955 DOA: 11/17/2022 DOS: the patient was seen and examined on 11/17/2022 PCP: [name], DO  Patient coming from: Home  Chief Complaint:  Chief Complaint  Patient presents with   Flank Pain    Left side   HPI: [name] is a 67 y.o. female with medical history significant of cervical cancer, recurrent pyelonephritis, hypothyroidism, multiple prior surgeries of the abdomen including hemicolectomy, also history of hysterectomy, cholecystectomy and exploratory laparotomy who was sent over from [facility] secondary to abdominal pain nausea vomiting and distention.  Patient is still passing gas but no bowel movement.  Patient was seen and evaluated.  No CT scan at [facility] at the moment.  CT scan here shows partial small bowel obstruction.  Patient is therefore being admitted to the hospital for further evaluation and treatment.  Denied any hematemesis or melena.  Denied any hematochezia.  Review of Systems: As mentioned in the history of present illness. All other systems reviewed and are negative. Past Medical History:  Diagnosis Date   Atrophic kidney    Cancer (HCC)    cervical 2003   Colovaginal fistula    Pyelonephritis    Thyroid disease    Vertigo    Past Surgical History:  Procedure Laterality Date   ABDOMINAL HYSTERECTOMY     cervical cancer   CHOLECYSTECTOMY     Colovaginal fistula repair  2019   low anterior resection with colovaginal fistula takedown, fistula repair, loop ileostomy   CYSTOSCOPY  2021   EXPLORATORY LAPAROTOMY     SBO   ILEOSTOMY     2019   ileostomy reversal     Social History:  reports that she has quit smoking. Her smoking use included cigarettes. She smoked an average of .15 packs per day. She has never used smokeless tobacco. She reports that she does not currently use alcohol. She reports that she does not use drugs.  Allergies  Allergen  Reactions   Barium-Containing Compounds     hives   Percocet [Oxycodone-Acetaminophen] Other (See Comments)    migraine     Family History  Problem Relation Age of Onset   Heart disease Father    Diabetes Father    Parkinson's disease Neg Hx     Prior to Admission medications   Medication Sig Start Date End Date Taking? Authorizing Provider  cetirizine (ZYRTEC ALLERGY) 10 MG tablet Take 1 tablet (10 mg total) by mouth daily. 08/30/22  Yes Fondaw, Wylder S, PA  fluticasone (FLONASE) 50 MCG/ACT nasal spray Place 2 sprays into both nostrils daily for 14 days. 08/30/22 11/17/22 Yes Fondaw, Wylder S, PA  ibuprofen (ADVIL) 600 MG tablet Take 1 tablet (600 mg total) by mouth every 8 (eight) hours as needed. 05/07/22  Yes Daryll Drown, NP  levothyroxine (SYNTHROID) 25 MCG tablet Take 1 tablet (25 mcg total) by mouth daily. 07/17/22  Yes Gottschalk, Kathie Rhodes M, DO  nitrofurantoin, macrocrystal-monohydrate, (MACROBID) 100 MG capsule Take 1 capsule (100 mg total) by mouth daily. 10/22/22  Yes Selmer Dominion, NP  simvastatin (ZOCOR) 20 MG tablet Take 1 tablet (20 mg total) by mouth daily at 6 PM. 09/23/22  Yes Nadine Counts, Ashly M, DO  estradiol (ESTRACE) 0.1 MG/GM vaginal cream Place 0.5g nightly for two weeks then twice a week after Patient not taking: Reported on 11/17/2022 10/14/22   Marguerita Beards, MD  mirabegron ER (MYRBETRIQ) 25 MG TB24 tablet  Take 1 tablet (25 mg total) by mouth daily. Patient not taking: Reported on 11/17/2022 10/11/22   Marguerita Beards, MD  ondansetron (ZOFRAN) 4 MG tablet Take 1 tablet (4 mg total) by mouth every 8 (eight) hours as needed for nausea or vomiting. Patient not taking: Reported on 11/17/2022 10/16/22   Sonny Masters, FNP    Physical Exam: Vitals:   11/17/22 1327 11/17/22 1634 11/17/22 1916 11/17/22 1930  BP: 135/77 136/73 (!) 147/81 (!) 155/73  Pulse: (!) 55 70 66 (!) 59  Resp: 16 17 17 17   Temp: 97.7 F (36.5 C) 98.2 F (36.8 C) 97.9 F (36.6 C)    TempSrc:  Oral Oral   SpO2: 100% 99% 100% 98%  Weight:      Height:       Constitutional: NAD, calm, comfortable Eyes: PERRL, lids and conjunctivae normal ENMT: Mucous membranes are moist. Posterior pharynx clear of any exudate or lesions.Normal dentition.  Neck: normal, supple, no masses, no thyromegaly Respiratory: clear to auscultation bilaterally, no wheezing, no crackles. Normal respiratory effort. No accessory muscle use.  Cardiovascular: Regular rate and rhythm, no murmurs / rubs / gallops. No extremity edema. 2+ pedal pulses. No carotid bruits.  Abdomen: Mildly distended, decreased bowel sounds, mildly tender, no masses palpated. No hepatosplenomegaly. Bowel sounds positive.  Musculoskeletal: Good range of motion, no joint swelling or tenderness, Skin: no rashes, lesions, ulcers. No induration Neurologic: CN 2-12 grossly intact. Sensation intact, DTR normal. Strength 5/5 in all 4.  Psychiatric: Normal judgment and insight. Alert and oriented x 3. Normal mood  Data Reviewed:  Glucose 133, BUN 37 creatinine 1.48.  Total bilirubin 1.3.  CBC entirely within normal.  Urinalysis essentially negative.  CT abdomen pelvis showed partial small bowel obstruction with transition point in the left pelvis suspected to be due to adhesions.  Also marked left renal atrophy hydronephrosis was noted previously but now resolved.  Assessment and Plan:  #1 partial small bowel obstruction: Patient will be admitted.  Surgery consulted.  Plan is to observe patient and if she starts having vomiting or more distention we will insert NG tube.  In the meantime pain control.  IV fluids.  Conservative management.  Hopefully will not require any surgery.  #2 hypothyroidism: Continue levothyroxine.  #3 anxiety disorder: Will continue with home regimen.  #4 hyperlipidemia: Continue his treatment once small bowel obstruction resolves.  #5 AKI: Hydrate aggressively.  #6 history of multiple surgeries:  Including ileostomy.  Previous surgery for vesicovaginal fistula.  Continue supportive care    Advance Care Planning:   Code Status: Full Code   Consults: Dr. Gwinda Passe, general surgery  Family Communication: No family at bedside  Severity of Illness: The appropriate patient status for this patient is INPATIENT. Inpatient status is judged to be reasonable and necessary in order to provide the required intensity of service to ensure the patient's safety. The patient's presenting symptoms, physical exam findings, and initial radiographic and laboratory data in the context of their chronic comorbidities is felt to place them at high risk for further clinical deterioration. Furthermore, it is not anticipated that the patient will be medically stable for discharge from the hospital within 2 midnights of admission.   * I certify that at the point of admission it is my clinical judgment that the patient will require inpatient hospital care spanning beyond 2 midnights from the point of admission due to high intensity of service, high risk for further deterioration and high frequency of surveillance required.*  AuthorLonia Blood, MD 11/17/2022 8:57 PM  For on call review [email or web address].

## 2022-11-17 NOTE — ED Triage Notes (Signed)
Pt stated they have been vomiting green emesis every day since Friday. Patient has pain in left flank area and is on Macrobid. Pt also started feeling pain on Friday.

## 2022-11-17 NOTE — ED Provider Notes (Signed)
[place] EMERGENCY DEPARTMENT AT [facility] Provider Note   CSN: [id number] Arrival date & time: 11/17/22  6578     History Chief Complaint  Patient presents with   Flank Pain    Left side    [name] is a 67 y.o. female.  Patient with past history significant for colovaginal fistula, chronic kidney disease stage III presents to the emergency department complaints of left-sided flank pain.  She reports that this pain is been present for the last few days and is also been endorsing some nausea and vomiting.  She reports that her vomit has been a green color for the last 3 days.  Denies any significant abdominal pain but has reported some left upper quadrant and left lower quadrant abdominal pain in association with the left flank pain.  Denies any obvious dysuria or increased urinary frequency but is currently being treated for frequent UTIs with a daily Macrobid.  She is somewhat concerned that the daily use of Macrobid may be contributing to her symptoms.   Flank Pain       Home Medications Prior to Admission medications   Medication Sig Start Date End Date Taking? Authorizing Provider  cetirizine (ZYRTEC ALLERGY) 10 MG tablet Take 1 tablet (10 mg total) by mouth daily. 08/30/22  Yes Fondaw, Wylder S, PA  fluticasone (FLONASE) 50 MCG/ACT nasal spray Place 2 sprays into both nostrils daily for 14 days. 08/30/22 11/17/22 Yes Fondaw, Wylder S, PA  ibuprofen (ADVIL) 600 MG tablet Take 1 tablet (600 mg total) by mouth every 8 (eight) hours as needed. 05/07/22  Yes Daryll Drown, NP  levothyroxine (SYNTHROID) 25 MCG tablet Take 1 tablet (25 mcg total) by mouth daily. 07/17/22  Yes Gottschalk, Kathie Rhodes M, DO  nitrofurantoin, macrocrystal-monohydrate, (MACROBID) 100 MG capsule Take 1 capsule (100 mg total) by mouth daily. 10/22/22  Yes Selmer Dominion, NP  simvastatin (ZOCOR) 20 MG tablet Take 1 tablet (20 mg total) by mouth daily at 6 PM. 09/23/22  Yes Gottschalk, Ashly M, DO   estradiol (ESTRACE) 0.1 MG/GM vaginal cream Place 0.5g nightly for two weeks then twice a week after Patient not taking: Reported on 11/17/2022 10/14/22   Marguerita Beards, MD  mirabegron ER (MYRBETRIQ) 25 MG TB24 tablet Take 1 tablet (25 mg total) by mouth daily. Patient not taking: Reported on 11/17/2022 10/11/22   Marguerita Beards, MD  ondansetron (ZOFRAN) 4 MG tablet Take 1 tablet (4 mg total) by mouth every 8 (eight) hours as needed for nausea or vomiting. Patient not taking: Reported on 11/17/2022 10/16/22   Sonny Masters, FNP      Allergies    Barium-containing compounds and Percocet [oxycodone-acetaminophen]    Review of Systems   Review of Systems  Genitourinary:  Positive for flank pain.  All other systems reviewed and are negative.   Physical Exam Updated Vital Signs BP 136/73 (BP Location: Left Arm)   Pulse 70   Temp 98.2 F (36.8 C) (Oral)   Resp 17   Ht 5\' 2"  (1.575 m)   Wt 56.7 kg   SpO2 99%   BMI 22.86 kg/m  Physical Exam Vitals and nursing note reviewed.  Constitutional:      General: She is not in acute distress.    Appearance: She is well-developed.  HENT:     Head: Normocephalic and atraumatic.  Eyes:     Conjunctiva/sclera: Conjunctivae normal.  Cardiovascular:     Rate and Rhythm: Normal rate and regular  rhythm.     Heart sounds: No murmur heard. Pulmonary:     Effort: Pulmonary effort is normal. No respiratory distress.     Breath sounds: Normal breath sounds.  Abdominal:     Palpations: Abdomen is soft.     Tenderness: There is abdominal tenderness in the left upper quadrant and left lower quadrant. There is left CVA tenderness. There is no right CVA tenderness.     Comments: Mild left CVA tenderness  Musculoskeletal:        General: No swelling or tenderness.     Cervical back: Neck supple.  Skin:    General: Skin is warm and dry.     Capillary Refill: Capillary refill takes less than 2 seconds.     Coloration: Skin is not jaundiced.      Findings: No erythema.  Neurological:     General: No focal deficit present.     Mental Status: She is alert.  Psychiatric:        Mood and Affect: Mood normal.     ED Results / Procedures / Treatments   Labs (all labs ordered are listed, but only abnormal results are displayed) Labs Reviewed  COMPREHENSIVE METABOLIC PANEL - Abnormal; Notable for the following components:      Result Value   Glucose, Bld 133 (*)    BUN 37 (*)    Creatinine, Ser 1.48 (*)    Total Bilirubin 1.3 (*)    GFR, Estimated 39 (*)    All other components within normal limits  URINALYSIS, ROUTINE W REFLEX MICROSCOPIC - Abnormal; Notable for the following components:   APPearance HAZY (*)    Hgb urine dipstick SMALL (*)    Ketones, ur 20 (*)    Protein, ur 30 (*)    Leukocytes,Ua TRACE (*)    Bacteria, UA RARE (*)    All other components within normal limits  CBC WITH DIFFERENTIAL/PLATELET  LIPASE, BLOOD    EKG None  Radiology CT ABDOMEN PELVIS W CONTRAST  Result Date: 11/17/2022 CLINICAL DATA:  Abdominal pain.  Nausea vomiting. EXAM: CT ABDOMEN AND PELVIS WITH CONTRAST TECHNIQUE: Multidetector CT imaging of the abdomen and pelvis was performed using the standard protocol following bolus administration of intravenous contrast. RADIATION DOSE REDUCTION: This exam was performed according to the departmental dose-optimization program which includes automated exposure control, adjustment of the mA and/or kV according to patient size and/or use of iterative reconstruction technique. CONTRAST:  OMNIPAQUE IOHEXOL 300 MG/ML  SOLN COMPARISON:  09/11/2022. FINDINGS: Lower chest: Clear lung bases. Hepatobiliary: No focal liver abnormality is seen. Status post cholecystectomy. No biliary dilatation. Pancreas: Atrophy.  No mass or inflammation. Spleen: Normal in size without focal abnormality. Adrenals/Urinary Tract: Stable 2.2 cm left adrenal mass, measuring -3 Hounsfield units on the previous unenhanced CT,  consistent with an adenoma. No follow-up recommended. Normal right adrenal gland. Marked left renal atrophy. Left hydronephrosis noted on the prior CT is no longer visualized. Normal right kidney. Ureters normal in course and in caliber. Bladder unremarkable. Stomach/Bowel: Dilated proximal small bowel to a maximum of 3.8 cm. There is a transition point in the left pelvis, with decompressed small bowel distal to this. A small bowel anastomosis staple line is noted along the distal ileum in the right pelvis. Stomach mildly distended with fluid. No wall thickening or inflammation. No small bowel wall thickening. Colon is normal in caliber. No colonic wall thickening or convincing inflammation. Vascular/Lymphatic: Aortic atherosclerosis. No aneurysm. No enlarged lymph nodes. Reproductive: Status  post hysterectomy. No adnexal masses. Extensive pelvic scarring with inferior pelvic sidewall calcifications and surgical vascular clips related to treatment of cervical carcinoma, stable from the prior exam. Other: No hernia or ascites. Musculoskeletal: No fracture or acute finding. No osteoblastic or osteolytic lesions. IMPRESSION: 1. Partial small bowel obstruction, transition point in the left pelvis suspected to be due to adhesions. 2. No other acute abnormality within the abdomen or pelvis. 3. Marked left renal atrophy, stable. Hydronephrosis noted on the previous CT has resolved. 4. Stable chronic pelvic changes from treatment of cervical carcinoma. 5. Aortic atherosclerosis. Electronically Signed   By: Amie Portland M.D.   On: 11/17/2022 17:27    Procedures Procedures   Medications Ordered in ED Medications  ondansetron (ZOFRAN) injection 4 mg (4 mg Intravenous Given 11/17/22 1038)  sodium chloride 0.9 % bolus 1,000 mL (0 mLs Intravenous Stopped 11/17/22 1213)  ondansetron (ZOFRAN) injection 4 mg (4 mg Intravenous Given 11/17/22 1718)  iohexol (OMNIPAQUE) 300 MG/ML solution 100 mL (100 mLs Intravenous Contrast Given  11/17/22 1706)    ED Course/ Medical Decision Making/ A&P                           Medical Decision Making Amount and/or Complexity of Data Reviewed Labs: ordered. Radiology: ordered.  Risk Prescription drug management. Decision regarding hospitalization.   This patient presents to the ED for concern of flank pain.  Differential diagnosis includes UTI, pyelonephritis, nephrolithiasis, diverticulitis, appendicitis   Lab Tests:  I Ordered, and personally interpreted labs.  The pertinent results include: CBC unremarkable, CMP with elevation in BUN and creatinine but appears somewhat at baseline.  UA with signs of suspected infection   Imaging Studies ordered:  I ordered imaging studies including CT abdomen pelvis Unable to obtain here at [facility], ER due to nonfunctional CT scanner.  Will require transfer to [facility] ER for evaluation. I agree with the radiologist interpretation   Medicines ordered and prescription drug management:  I ordered medication including fluids, Zofran for dehydration and vomiting Reevaluation of the patient after these medicines showed that the patient improved I have reviewed the patients home medicines and have made adjustments as needed   Problem List / ED Course:  Patient presents to the emergency department complaints of flank pain.  She reports she is also been having some associated bilious vomit for the last few days.  She has been treated recently for UTI and is currently on a daily antibiotic, Macrobid, for frequent UTIs.  Concerned that this antibiotic may be causing her symptoms. Will evaluate patient with lab workup and discussed eventual need for imaging that may require transfer to alternate facility as CT scan is not available here at this time.  Will treat symptomatically with fluids and Zofran and reassess patient shortly. Patient reports significant improvement with antiemetics and fluids. Given findings of blood and infection  still present in UA in conjunction with flank pain, will have patient arrive POV to [facility] ER for CT imaging as we do not currently have a functioning CT scanner in our ER today. Spoke with Dr. Rush Landmark, ED attending physician, regarding need for imaging evaluation and he has agreed to accept patient for transfer. Patient is agreeable plan to self-transfer with spouse driving to [facility] ER for further evaluation with imaging. All questions answered prior to patient transfer POV to [facility]. Address provided for patient to arrive there for imaging.   Final Clinical Impression(s) /  ED Diagnoses Final diagnoses:  Left flank pain  Partial small bowel obstruction (HCC)  Nausea and vomiting, unspecified vomiting type  Generalized abdominal pain    Rx / DC Orders ED Discharge Orders     None         Salomon Mast 11/17/22 1832    Glendora Score, MD 11/18/22 2229

## 2022-11-17 NOTE — ED Notes (Signed)
ED TO INPATIENT HANDOFF REPORT  ED Nurse Name and Phone #: Kennen Stammer  S Name/Age/Gender [name] 67 y.o. female Room/Bed: WA10/WA10  Code Status   Code Status: Not on file  Home/SNF/Other Home Patient oriented to: self, place, time, and situation Is this baseline? Yes   Triage Complete: Triage complete  Chief Complaint SBO (small bowel obstruction) (HCC) [K56.609]  Triage Note Pt stated they have been vomiting green emesis every day since Friday. Patient has pain in left flank area and is on Macrobid. Pt also started feeling pain on Friday.   Allergies Allergies  Allergen Reactions   Barium-Containing Compounds     hives   Percocet [Oxycodone-Acetaminophen] Other (See Comments)    migraine     Level of Care/Admitting Diagnosis ED Disposition     ED Disposition  Admit   Condition  --   Comment  Hospital Area: [facility] [[id number]]  Level of Care: Med-Surg [16]  May admit patient to [facility] or [facility] if equivalent level of care is available:: No  Covid Evaluation: Asymptomatic - no recent exposure (last 10 days) testing not required  Diagnosis: SBO (small bowel obstruction) (HCC) [161096]  Admitting Physician: Rometta Emery [[id number]]  Attending Physician: Rometta Emery [[id number]]  Certification:: I certify this patient will need inpatient services for at least 2 midnights  Estimated Length of Stay: 4          B Medical/Surgery History Past Medical History:  Diagnosis Date   Atrophic kidney    Cancer (HCC)    cervical 2003   Colovaginal fistula    Pyelonephritis    Thyroid disease    Vertigo    Past Surgical History:  Procedure Laterality Date   ABDOMINAL HYSTERECTOMY     cervical cancer   CHOLECYSTECTOMY     Colovaginal fistula repair  2019   low anterior resection with colovaginal fistula takedown, fistula repair, loop ileostomy   CYSTOSCOPY  2021   EXPLORATORY LAPAROTOMY     SBO   ILEOSTOMY     2019    ileostomy reversal       A IV Location/Drains/Wounds Patient Lines/Drains/Airways Status     Active Line/Drains/Airways     Name Placement date Placement time Site Days   Peripheral IV 11/17/22 20 G 1" Right Antecubital 11/17/22  1038  Antecubital  less than 1            Intake/Output Last 24 hours No intake or output data in the 24 hours ending 11/17/22 2034  Labs/Imaging Results for orders placed or performed during the hospital encounter of 11/17/22 (from the past 48 hour(s))  Comprehensive metabolic panel     Status: Abnormal   Collection Time: 11/17/22 10:00 AM  Result Value Ref Range   Sodium 137 135 - 145 mmol/L   Potassium 4.2 3.5 - 5.1 mmol/L   Chloride 98 98 - 111 mmol/L   CO2 25 22 - 32 mmol/L   Glucose, Bld 133 (H) 70 - 99 mg/dL    Comment: Glucose reference range applies only to samples taken after fasting for at least 8 hours.   BUN 37 (H) 8 - 23 mg/dL   Creatinine, Ser 0.45 (H) 0.44 - 1.00 mg/dL   Calcium 9.6 8.9 - 40.9 mg/dL   Total Protein 7.7 6.5 - 8.1 g/dL   Albumin 4.4 3.5 - 5.0 g/dL   AST 15 15 - 41 U/L   ALT 13 0 - 44 U/L   Alkaline  Phosphatase 79 38 - 126 U/L   Total Bilirubin 1.3 (H) 0.3 - 1.2 mg/dL   GFR, Estimated 39 (L) >60 mL/min    Comment: (NOTE) Calculated using the CKD-EPI Creatinine Equation (2021)    Anion gap 14 5 - 15    Comment: Performed at [facility], [address] [place], [place] [address]  CBC with Differential     Status: None   Collection Time: 11/17/22 10:00 AM  Result Value Ref Range   WBC 6.2 4.0 - 10.5 K/uL   RBC 4.48 3.87 - 5.11 MIL/uL   Hemoglobin 14.4 12.0 - 15.0 g/dL   HCT 60.4 54.0 - 98.1 %   MCV 91.5 80.0 - 100.0 fL   MCH 32.1 26.0 - 34.0 pg   MCHC 35.1 30.0 - 36.0 g/dL   RDW 19.1 47.8 - 29.5 %   Platelets 169 150 - 400 K/uL   nRBC 0.0 0.0 - 0.2 %   Neutrophils Relative % 79 %   Neutro Abs 4.9 1.7 - 7.7 K/uL   Lymphocytes Relative 13 %   Lymphs Abs 0.8 0.7 - 4.0 K/uL   Monocytes Relative 7 %    Monocytes Absolute 0.5 0.1 - 1.0 K/uL   Eosinophils Relative 1 %   Eosinophils Absolute 0.0 0.0 - 0.5 K/uL   Basophils Relative 0 %   Basophils Absolute 0.0 0.0 - 0.1 K/uL   Immature Granulocytes 0 %   Abs Immature Granulocytes 0.02 0.00 - 0.07 K/uL    Comment: Performed at [facility], [address], [place], [place] [address]  Lipase, blood     Status: None   Collection Time: 11/17/22 10:00 AM  Result Value Ref Range   Lipase 22 11 - 51 U/L    Comment: Performed at [facility], [address], [place], [place] [address]  Urinalysis, Routine w reflex microscopic -Urine, Clean Catch     Status: Abnormal   Collection Time: 11/17/22 12:14 PM  Result Value Ref Range   Color, Urine YELLOW YELLOW   APPearance HAZY (A) CLEAR   Specific Gravity, Urine 1.023 1.005 - 1.030   pH 5.0 5.0 - 8.0   Glucose, UA NEGATIVE NEGATIVE mg/dL   Hgb urine dipstick SMALL (A) NEGATIVE   Bilirubin Urine NEGATIVE NEGATIVE   Ketones, ur 20 (A) NEGATIVE mg/dL   Protein, ur 30 (A) NEGATIVE mg/dL   Nitrite NEGATIVE NEGATIVE   Leukocytes,Ua TRACE (A) NEGATIVE   RBC / HPF 11-20 0 - 5 RBC/hpf   WBC, UA 11-20 0 - 5 WBC/hpf   Bacteria, UA RARE (A) NONE SEEN   Squamous Epithelial / HPF 0-5 0 - 5 /HPF   Mucus PRESENT    Hyaline Casts, UA PRESENT     Comment: Performed at [facility], [address] [place], [place] [address]   CT ABDOMEN PELVIS W CONTRAST  Result Date: 11/17/2022 CLINICAL DATA:  Abdominal pain.  Nausea vomiting. EXAM: CT ABDOMEN AND PELVIS WITH CONTRAST TECHNIQUE: Multidetector CT imaging of the abdomen and pelvis was performed using the standard protocol following bolus administration of intravenous contrast. RADIATION DOSE REDUCTION: This exam was performed according to the departmental dose-optimization program which includes automated exposure control, adjustment of the mA and/or kV according to patient size and/or use of iterative reconstruction technique. CONTRAST:  OMNIPAQUE IOHEXOL 300  MG/ML  SOLN COMPARISON:  09/11/2022. FINDINGS: Lower chest: Clear lung bases. Hepatobiliary: No focal liver abnormality is seen. Status post cholecystectomy. No biliary dilatation. Pancreas: Atrophy.  No mass  or inflammation. Spleen: Normal in size without focal abnormality. Adrenals/Urinary Tract: Stable 2.2 cm left adrenal mass, measuring -3 Hounsfield units on the previous unenhanced CT, consistent with an adenoma. No follow-up recommended. Normal right adrenal gland. Marked left renal atrophy. Left hydronephrosis noted on the prior CT is no longer visualized. Normal right kidney. Ureters normal in course and in caliber. Bladder unremarkable. Stomach/Bowel: Dilated proximal small bowel to a maximum of 3.8 cm. There is a transition point in the left pelvis, with decompressed small bowel distal to this. A small bowel anastomosis staple line is noted along the distal ileum in the right pelvis. Stomach mildly distended with fluid. No wall thickening or inflammation. No small bowel wall thickening. Colon is normal in caliber. No colonic wall thickening or convincing inflammation. Vascular/Lymphatic: Aortic atherosclerosis. No aneurysm. No enlarged lymph nodes. Reproductive: Status post hysterectomy. No adnexal masses. Extensive pelvic scarring with inferior pelvic sidewall calcifications and surgical vascular clips related to treatment of cervical carcinoma, stable from the prior exam. Other: No hernia or ascites. Musculoskeletal: No fracture or acute finding. No osteoblastic or osteolytic lesions. IMPRESSION: 1. Partial small bowel obstruction, transition point in the left pelvis suspected to be due to adhesions. 2. No other acute abnormality within the abdomen or pelvis. 3. Marked left renal atrophy, stable. Hydronephrosis noted on the previous CT has resolved. 4. Stable chronic pelvic changes from treatment of cervical carcinoma. 5. Aortic atherosclerosis. Electronically Signed   By: Amie Portland M.D.   On:  11/17/2022 17:27    Pending Labs Unresulted Labs (From admission, onward)    None       Vitals/Pain Today's Vitals   11/17/22 1634 11/17/22 1916 11/17/22 1921 11/17/22 1930  BP: 136/73 (!) 147/81  (!) 155/73  Pulse: 70 66  (!) 59  Resp: 17 17  17   Temp: 98.2 F (36.8 C) 97.9 F (36.6 C)    TempSrc: Oral Oral    SpO2: 99% 100%  98%  Weight:      Height:      PainSc:   4      Isolation Precautions No active isolations  Medications Medications  ondansetron (ZOFRAN) injection 4 mg (4 mg Intravenous Given 11/17/22 1038)  sodium chloride 0.9 % bolus 1,000 mL (0 mLs Intravenous Stopped 11/17/22 1213)  ondansetron (ZOFRAN) injection 4 mg (4 mg Intravenous Given 11/17/22 1718)  iohexol (OMNIPAQUE) 300 MG/ML solution 100 mL (100 mLs Intravenous Contrast Given 11/17/22 1706)    Mobility walks     R Recommendations: See Admitting Provider Note  Report given to:   Additional Notes:

## 2022-11-18 ENCOUNTER — Inpatient Hospital Stay (HOSPITAL_COMMUNITY): Payer: Medicare Other

## 2022-11-18 DIAGNOSIS — E782 Mixed hyperlipidemia: Secondary | ICD-10-CM

## 2022-11-18 DIAGNOSIS — E039 Hypothyroidism, unspecified: Secondary | ICD-10-CM | POA: Diagnosis not present

## 2022-11-18 DIAGNOSIS — N179 Acute kidney failure, unspecified: Secondary | ICD-10-CM | POA: Diagnosis present

## 2022-11-18 DIAGNOSIS — K56609 Unspecified intestinal obstruction, unspecified as to partial versus complete obstruction: Secondary | ICD-10-CM | POA: Diagnosis not present

## 2022-11-18 LAB — COMPREHENSIVE METABOLIC PANEL
ALT: 10 U/L (ref 0–44)
AST: 11 U/L — ABNORMAL LOW (ref 15–41)
Albumin: 3.4 g/dL — ABNORMAL LOW (ref 3.5–5.0)
Alkaline Phosphatase: 53 U/L (ref 38–126)
Anion gap: 9 (ref 5–15)
BUN: 32 mg/dL — ABNORMAL HIGH (ref 8–23)
CO2: 20 mmol/L — ABNORMAL LOW (ref 22–32)
Calcium: 8.3 mg/dL — ABNORMAL LOW (ref 8.9–10.3)
Chloride: 108 mmol/L (ref 98–111)
Creatinine, Ser: 1.23 mg/dL — ABNORMAL HIGH (ref 0.44–1.00)
GFR, Estimated: 48 mL/min — ABNORMAL LOW (ref >60)
Glucose, Bld: 72 mg/dL (ref 70–99)
Potassium: 3.7 mmol/L (ref 3.5–5.1)
Sodium: 137 mmol/L (ref 135–145)
Total Bilirubin: 0.9 mg/dL (ref 0.3–1.2)
Total Protein: 5.6 g/dL — ABNORMAL LOW (ref 6.5–8.1)

## 2022-11-18 LAB — CBC
HCT: 34.5 % — ABNORMAL LOW (ref 36.0–46.0)
Hemoglobin: 11.7 g/dL — ABNORMAL LOW (ref 12.0–15.0)
MCH: 32.7 pg (ref 26.0–34.0)
MCHC: 33.9 g/dL (ref 30.0–36.0)
MCV: 96.4 fL (ref 80.0–100.0)
Platelets: 133 10*3/uL — ABNORMAL LOW (ref 150–400)
RBC: 3.58 MIL/uL — ABNORMAL LOW (ref 3.87–5.11)
RDW: 12.5 % (ref 11.5–15.5)
WBC: 5.1 10*3/uL (ref 4.0–10.5)
nRBC: 0 % (ref 0.0–0.2)

## 2022-11-18 MED ORDER — SIMVASTATIN 20 MG PO TABS: 20.0000 mg | ORAL_TABLET | Freq: Every day | ORAL | Status: DC

## 2022-11-18 MED ORDER — LEVOTHYROXINE SODIUM 25 MCG PO TABS
25.0000 ug | ORAL_TABLET | Freq: Every day | ORAL | Status: DC
  Administered 2022-11-19: 25 ug via ORAL
  Filled 2022-11-18: qty 1

## 2022-11-18 NOTE — Progress Notes (Signed)
Mobility Specialist - Progress Note   11/18/22 0941  Mobility  Activity Ambulated independently to bathroom;Ambulated independently in hallway  Level of Assistance Independent  Assistive Device None  Distance Ambulated (ft) 500 ft  Activity Response Tolerated well  Mobility Referral Yes  $Mobility charge 1 Mobility  Mobility Specialist Start Time (ACUTE ONLY) 0931  Mobility Specialist Stop Time (ACUTE ONLY) [id number]  Mobility Specialist Time Calculation (min) (ACUTE ONLY) 7 min   Pt received in bed and agreeable to mobility. No complaints during session. Pt to bed after session with all needs met.   [facility]

## 2022-11-18 NOTE — Assessment & Plan Note (Addendum)
Resume home regimen of Synthroid now that patient is taking p.o. intake

## 2022-11-18 NOTE — Progress Notes (Signed)
   11/18/22 1411  TOC Brief Assessment  Insurance and Status Reviewed  Patient has primary care physician Yes  Home environment has been reviewed home with spouse  Prior level of function: independent  Prior/Current Home Services No current home services  Social Determinants of Health Reivew SDOH reviewed no interventions necessary  Readmission risk has been reviewed Yes  Transition of care needs no transition of care needs at this time

## 2022-11-18 NOTE — Consult Note (Signed)
Reason for Consult:ab pain Referring Physician: Dr Davene Costain is an 67 y.o. female.  HPI: 36 yof with prior history of cervical cancer with multiple surgeries including colectomy, ileostomy, takedown- has prior sbo she tells me she had surgery for at OSH as well.  She has has n/v and ab pain since Thursday.  No real bowel function.  She presented to [facility] yesterday.  They dont have a functional ct scanner so she was transferred here.  Ct showed a sbo. By the time I see her she has no n/v, having copious diarrhea and flatus, no ab pain. She wants something to drink.   Past Medical History:  Diagnosis Date   Atrophic kidney    Cancer (HCC)    cervical 2003   Colovaginal fistula    Pyelonephritis    Thyroid disease    Vertigo     Past Surgical History:  Procedure Laterality Date   ABDOMINAL HYSTERECTOMY     cervical cancer   CHOLECYSTECTOMY     Colovaginal fistula repair  2019   low anterior resection with colovaginal fistula takedown, fistula repair, loop ileostomy   CYSTOSCOPY  2021   EXPLORATORY LAPAROTOMY     SBO   ILEOSTOMY     2019   ileostomy reversal      Family History  Problem Relation Age of Onset   Heart disease Father    Diabetes Father    Parkinson's disease Neg Hx     Social History:  reports that she has quit smoking. Her smoking use included cigarettes. She smoked an average of .15 packs per day. She has never used smokeless tobacco. She reports that she does not currently use alcohol. She reports that she does not use drugs.  Allergies:  Allergies  Allergen Reactions   Barium-Containing Compounds     hives   Percocet [Oxycodone-Acetaminophen] Other (See Comments)    migraine    Current Facility-Administered Medications  Medication Dose Route Frequency Provider Last Rate Last Admin   dextrose 5 % in lactated ringers infusion   Intravenous Continuous Rometta Emery, MD 100 mL/hr at 11/17/22 2225 New Bag at 11/17/22 2225   enoxaparin  (LOVENOX) injection 30 mg  30 mg Subcutaneous Q24H Earlie Lou L, MD   30 mg at 11/17/22 2229   HYDROmorphone (DILAUDID) injection 0.5 mg  0.5 mg Intravenous Q2H PRN Rometta Emery, MD       ondansetron (ZOFRAN) tablet 4 mg  4 mg Oral Q6H PRN Rometta Emery, MD       Or   ondansetron (ZOFRAN) injection 4 mg  4 mg Intravenous Q6H PRN Rometta Emery, MD   4 mg at 11/17/22 2258     Results for orders placed or performed during the hospital encounter of 11/17/22 (from the past 48 hour(s))  Comprehensive metabolic panel     Status: Abnormal   Collection Time: 11/17/22 10:00 AM  Result Value Ref Range   Sodium 137 135 - 145 mmol/L   Potassium 4.2 3.5 - 5.1 mmol/L   Chloride 98 98 - 111 mmol/L   CO2 25 22 - 32 mmol/L   Glucose, Bld 133 (H) 70 - 99 mg/dL    Comment: Glucose reference range applies only to samples taken after fasting for at least 8 hours.   BUN 37 (H) 8 - 23 mg/dL   Creatinine, Ser 9.56 (H) 0.44 - 1.00 mg/dL   Calcium 9.6 8.9 - 21.3 mg/dL   Total Protein 7.7  6.5 - 8.1 g/dL   Albumin 4.4 3.5 - 5.0 g/dL   AST 15 15 - 41 U/L   ALT 13 0 - 44 U/L   Alkaline Phosphatase 79 38 - 126 U/L   Total Bilirubin 1.3 (H) 0.3 - 1.2 mg/dL   GFR, Estimated 39 (L) >60 mL/min    Comment: (NOTE) Calculated using the CKD-EPI Creatinine Equation (2021)    Anion gap 14 5 - 15    Comment: Performed at [facility], [address], [place], [place] [address]  CBC with Differential     Status: None   Collection Time: 11/17/22 10:00 AM  Result Value Ref Range   WBC 6.2 4.0 - 10.5 K/uL   RBC 4.48 3.87 - 5.11 MIL/uL   Hemoglobin 14.4 12.0 - 15.0 g/dL   HCT 60.4 54.0 - 98.1 %   MCV 91.5 80.0 - 100.0 fL   MCH 32.1 26.0 - 34.0 pg   MCHC 35.1 30.0 - 36.0 g/dL   RDW 19.1 47.8 - 29.5 %   Platelets 169 150 - 400 K/uL   nRBC 0.0 0.0 - 0.2 %   Neutrophils Relative % 79 %   Neutro Abs 4.9 1.7 - 7.7 K/uL   Lymphocytes Relative 13 %   Lymphs Abs 0.8 0.7 - 4.0 K/uL   Monocytes Relative 7 %    Monocytes Absolute 0.5 0.1 - 1.0 K/uL   Eosinophils Relative 1 %   Eosinophils Absolute 0.0 0.0 - 0.5 K/uL   Basophils Relative 0 %   Basophils Absolute 0.0 0.0 - 0.1 K/uL   Immature Granulocytes 0 %   Abs Immature Granulocytes 0.02 0.00 - 0.07 K/uL    Comment: Performed at [facility], [address], [place], [place] [address]  Lipase, blood     Status: None   Collection Time: 11/17/22 10:00 AM  Result Value Ref Range   Lipase 22 11 - 51 U/L    Comment: Performed at [facility], [address] [place], [place] [address]  Urinalysis, Routine w reflex microscopic -Urine, Clean Catch     Status: Abnormal   Collection Time: 11/17/22 12:14 PM  Result Value Ref Range   Color, Urine YELLOW YELLOW   APPearance HAZY (A) CLEAR   Specific Gravity, Urine 1.023 1.005 - 1.030   pH 5.0 5.0 - 8.0   Glucose, UA NEGATIVE NEGATIVE mg/dL   Hgb urine dipstick SMALL (A) NEGATIVE   Bilirubin Urine NEGATIVE NEGATIVE   Ketones, ur 20 (A) NEGATIVE mg/dL   Protein, ur 30 (A) NEGATIVE mg/dL   Nitrite NEGATIVE NEGATIVE   Leukocytes,Ua TRACE (A) NEGATIVE   RBC / HPF 11-20 0 - 5 RBC/hpf   WBC, UA 11-20 0 - 5 WBC/hpf   Bacteria, UA RARE (A) NONE SEEN   Squamous Epithelial / HPF 0-5 0 - 5 /HPF   Mucus PRESENT    Hyaline Casts, UA PRESENT     Comment: Performed at [facility], [address], [place], [place] [address]  CBC     Status: Abnormal   Collection Time: 11/18/22  4:33 AM  Result Value Ref Range   WBC 5.1 4.0 - 10.5 K/uL   RBC 3.58 (L) 3.87 - 5.11 MIL/uL   Hemoglobin 11.7 (L) 12.0 - 15.0 g/dL   HCT 95.2 (L) 84.1 - 32.4 %   MCV 96.4 80.0 - 100.0 fL   MCH 32.7 26.0 - 34.0 pg   MCHC 33.9 30.0 - 36.0 g/dL   RDW 40.1 02.7 - 25.3 %  Platelets 133 (L) 150 - 400 K/uL   nRBC 0.0 0.0 - 0.2 %    Comment: Performed at [facility], [address], [place], [place] [address]  Comprehensive metabolic panel     Status: Abnormal   Collection Time: 11/18/22  4:33 AM  Result Value  Ref Range   Sodium 137 135 - 145 mmol/L   Potassium 3.7 3.5 - 5.1 mmol/L   Chloride 108 98 - 111 mmol/L   CO2 20 (L) 22 - 32 mmol/L   Glucose, Bld 72 70 - 99 mg/dL    Comment: Glucose reference range applies only to samples taken after fasting for at least 8 hours.   BUN 32 (H) 8 - 23 mg/dL   Creatinine, Ser 8.11 (H) 0.44 - 1.00 mg/dL   Calcium 8.3 (L) 8.9 - 10.3 mg/dL   Total Protein 5.6 (L) 6.5 - 8.1 g/dL   Albumin 3.4 (L) 3.5 - 5.0 g/dL   AST 11 (L) 15 - 41 U/L   ALT 10 0 - 44 U/L   Alkaline Phosphatase 53 38 - 126 U/L   Total Bilirubin 0.9 0.3 - 1.2 mg/dL   GFR, Estimated 48 (L) >60 mL/min    Comment: (NOTE) Calculated using the CKD-EPI Creatinine Equation (2021)    Anion gap 9 5 - 15    Comment: Performed at [facility], [address] [place], [place] [address]    DG Abd Portable 1V  Result Date: 11/18/2022 CLINICAL DATA:  Small-bowel obstruction EXAM: PORTABLE ABDOMEN - 1 VIEW COMPARISON:  Previous studies including the CT done on 2022/12/14 FINDINGS: There is dilation of small-bowel loops in left upper abdomen measuring up to 4 cm in diameter. Bowel gas pattern is otherwise unremarkable. Stomach is not distended. There is contrast in the urinary bladder from previous CT. Visualized lower lung fields are clear. Surgical clips are seen in right upper quadrant. IMPRESSION: There is dilation of proximal small bowel loops suggesting partial small bowel obstruction. Electronically Signed   By: Ernie Avena M.D.   On: 11/18/2022 09:38   CT ABDOMEN PELVIS W CONTRAST  Result Date: 14-Dec-2022 CLINICAL DATA:  Abdominal pain.  Nausea vomiting. EXAM: CT ABDOMEN AND PELVIS WITH CONTRAST TECHNIQUE: Multidetector CT imaging of the abdomen and pelvis was performed using the standard protocol following bolus administration of intravenous contrast. RADIATION DOSE REDUCTION: This exam was performed according to the departmental dose-optimization program which includes  automated exposure control, adjustment of the mA and/or kV according to patient size and/or use of iterative reconstruction technique. CONTRAST:  OMNIPAQUE IOHEXOL 300 MG/ML  SOLN COMPARISON:  09/11/2022. FINDINGS: Lower chest: Clear lung bases. Hepatobiliary: No focal liver abnormality is seen. Status post cholecystectomy. No biliary dilatation. Pancreas: Atrophy.  No mass or inflammation. Spleen: Normal in size without focal abnormality. Adrenals/Urinary Tract: Stable 2.2 cm left adrenal mass, measuring -3 Hounsfield units on the previous unenhanced CT, consistent with an adenoma. No follow-up recommended. Normal right adrenal gland. Marked left renal atrophy. Left hydronephrosis noted on the prior CT is no longer visualized. Normal right kidney. Ureters normal in course and in caliber. Bladder unremarkable. Stomach/Bowel: Dilated proximal small bowel to a maximum of 3.8 cm. There is a transition point in the left pelvis, with decompressed small bowel distal to this. A small bowel anastomosis staple line is noted along the distal ileum in the right pelvis. Stomach mildly distended with fluid. No wall thickening or inflammation. No small bowel wall thickening. Colon is normal  in caliber. No colonic wall thickening or convincing inflammation. Vascular/Lymphatic: Aortic atherosclerosis. No aneurysm. No enlarged lymph nodes. Reproductive: Status post hysterectomy. No adnexal masses. Extensive pelvic scarring with inferior pelvic sidewall calcifications and surgical vascular clips related to treatment of cervical carcinoma, stable from the prior exam. Other: No hernia or ascites. Musculoskeletal: No fracture or acute finding. No osteoblastic or osteolytic lesions. IMPRESSION: 1. Partial small bowel obstruction, transition point in the left pelvis suspected to be due to adhesions. 2. No other acute abnormality within the abdomen or pelvis. 3. Marked left renal atrophy, stable. Hydronephrosis noted on the previous  CT has resolved. 4. Stable chronic pelvic changes from treatment of cervical carcinoma. 5. Aortic atherosclerosis. Electronically Signed   By: Amie Portland M.D.   On: 11/17/2022 17:27    Review of Systems  Gastrointestinal:  Positive for abdominal distention, abdominal pain, nausea and vomiting.  All other systems reviewed and are negative.  Blood pressure (!) 142/78, pulse 73, temperature (!) 97.5 F (36.4 C), temperature source Oral, resp. rate 17, height 5\' 2"  (1.575 m), weight 56.7 kg, SpO2 97 %. Physical Exam Vitals reviewed.  Constitutional:      Appearance: Normal appearance.  Cardiovascular:     Rate and Rhythm: Normal rate.  Pulmonary:     Effort: Pulmonary effort is normal.  Abdominal:     General: There is no distension.     Palpations: Abdomen is soft.     Tenderness: There is no abdominal tenderness.     Comments: Well healed incisions   Neurological:     Mental Status: She is alert.     Assessment/Plan: SBO- resolved clinically already -fulls,adat -can dc home tomorrow if does well -no indication for surgery -pharm dvt prophylaxis is fine   Emelia Loron 11/18/2022, 9:52 AM

## 2022-11-18 NOTE — Assessment & Plan Note (Signed)
Proving with intravenous isotonic fluids. Thought to be prerenal injury secondary to volume depletion Strict input and output monitoring Monitoring renal function and electrolytes with serial chemistries Avoiding nephrotoxic agents if at all possible

## 2022-11-18 NOTE — Progress Notes (Signed)
PROGRESS NOTE   [name]  ZOX:[id number] DOB: 1955/09/04 DOA: 11/17/2022 PCP: [name], DO   Date of Service: the patient was seen and examined on 11/18/2022  Brief Narrative:  67 year old female with past medical history of chronic kidney disease stage IIIb, cervical cancer, hypothyroidism and multiple prior surgeries including hemicolectomy, hysterectomy, cholecystectomy and exploratory laparotomy presenting to [facility] emergency department with complaints of abdominal and flank pain for several days after recently having been placed on antibiotics for suspected urinary tract infection.  Upon evaluation in the [facility] emergency department due to no available CT nudging patient was transferred to [facility] where CT imaging of the abdomen pelvis revealed a partial small bowel obstruction.  EDP discussed case with Dr. Corliss Skains with general surgery who stated they would evaluate the patient in the morning in consultation and recommended admission under the hospitalist service.  Patient was admitted to hospital service.  Patient was made n.p.o. and placed on intravenous fluids.  An NG tube was not placed due to active vomiting during an attempt in the emergency department.    Assessment and Plan: * SBO (small bowel obstruction) (HCC) Clinically improving Surgery consulted, their input is appreciated Soft diet initiated today Monitoring overnight and if tolerated will likely discharge tomorrow  AKI (acute kidney injury) (HCC) Proving with intravenous isotonic fluids. Thought to be prerenal injury secondary to volume depletion Strict input and output monitoring Monitoring renal function and electrolytes with serial chemistries Avoiding nephrotoxic agents if at all possible   Hypothyroidism Resume home regimen of Synthroid now that patient is taking p.o. intake   Mixed hyperlipidemia Continuing home regimen of lipid lowering therapy now that patient  is taking p.o. intake.     Subjective:  Patient states that her abdominal pain has mostly improved, now mild in intensity.  Associated nausea vomiting have also improved.  Physical Exam:  Vitals:   11/18/22 0533 11/18/22 0952 11/18/22 1401 11/18/22 2047  BP: (!) 142/78 131/64 (!) 114/57 128/70  Pulse: 73 66 64 74  Resp: 17 18 18 18   Temp: (!) 97.5 F (36.4 C) (!) 97.4 F (36.3 C)  97.7 F (36.5 C)  TempSrc: Oral Oral    SpO2: 97% 100% 100% 99%  Weight:      Height:        Constitutional: Awake alert and oriented x3, no associated distress.   Skin: no rashes, no lesions, good skin turgor noted. Eyes: Pupils are equally reactive to light.  No evidence of scleral icterus or conjunctival pallor.  ENMT: Moist mucous membranes noted.  Posterior pharynx clear of any exudate or lesions.   Respiratory: clear to auscultation bilaterally, no wheezing, no crackles. Normal respiratory effort. No accessory muscle use.  Cardiovascular: Regular rate and rhythm, no murmurs / rubs / gallops. No extremity edema. 2+ pedal pulses. No carotid bruits.  Abdomen: Mild generalized onerous, abdomen is soft.  No evidence of intra-abdominal masses.  Positive bowel sounds noted in all quadrants.   Musculoskeletal: No joint deformity upper and lower extremities. Good ROM, no contractures. Normal muscle tone.    Data Reviewed:  I have personally reviewed and interpreted labs, imaging.  Significant findings are   CBC: Recent Labs  Lab 11/17/22 1000 11/18/22 0433  WBC 6.2 5.1  NEUTROABS 4.9  --   HGB 14.4 11.7*  HCT 41.0 34.5*  MCV 91.5 96.4  PLT 169 133*   Basic Metabolic Panel: Recent Labs  Lab 11/17/22 1000 11/18/22 0433  NA  137 137  K 4.2 3.7  CL 98 108  CO2 25 20*  GLUCOSE 133* 72  BUN 37* 32*  CREATININE 1.48* 1.23*  CALCIUM 9.6 8.3*   GFR: Estimated Creatinine Clearance: 35.6 mL/min (A) (by C-G formula based on SCr of 1.23 mg/dL (H)). Liver Function Tests: Recent Labs   Lab 11/17/22 1000 11/18/22 0433  AST 15 11*  ALT 13 10  ALKPHOS 79 53  BILITOT 1.3* 0.9  PROT 7.7 5.6*  ALBUMIN 4.4 3.4*     Code Status:  Full code.  Code status decision has been confirmed with: patient Family Communication: Husband is at bedside and has been updated on plan of care.   Severity of Illness:  The appropriate patient status for this patient is INPATIENT. Inpatient status is judged to be reasonable and necessary in order to provide the required intensity of service to ensure the patient's safety. The patient's presenting symptoms, physical exam findings, and initial radiographic and laboratory data in the context of their chronic comorbidities is felt to place them at high risk for further clinical deterioration. Furthermore, it is not anticipated that the patient will be medically stable for discharge from the hospital within 2 midnights of admission.   * I certify that at the point of admission it is my clinical judgment that the patient will require inpatient hospital care spanning beyond 2 midnights from the point of admission due to high intensity of service, high risk for further deterioration and high frequency of surveillance required.*  Time spent:  50 minutes  Author:  Marinda Elk MD  11/18/2022 10:16 PM

## 2022-11-18 NOTE — Assessment & Plan Note (Addendum)
Continuing home regimen of lipid lowering therapy now that patient is taking p.o. intake.

## 2022-11-18 NOTE — Progress Notes (Signed)
Pt had multiple watery bowel movement tonight.  Pt was complaining about her NPO diet, pt wanted to eat something. educated pt regarding pt care.

## 2022-11-18 NOTE — Progress Notes (Signed)
Mobility Specialist - Progress Note   11/18/22 1245  Mobility  Activity Ambulated independently in hallway  Level of Assistance Independent  Assistive Device None  Distance Ambulated (ft) 500 ft  Activity Response Tolerated well  Mobility Referral Yes  $Mobility charge 1 Mobility  Mobility Specialist Start Time (ACUTE ONLY) 1240  Mobility Specialist Stop Time (ACUTE ONLY) 1245  Mobility Specialist Time Calculation (min) (ACUTE ONLY) 5 min   Pt received in bed and agreeable to mobility. No complaints during session. Pt to bed after session with all needs met.    [facility]

## 2022-11-18 NOTE — Hospital Course (Signed)
67 year old female with past medical history of chronic kidney disease stage IIIb, cervical cancer, hypothyroidism and multiple prior surgeries including hemicolectomy, hysterectomy, cholecystectomy and exploratory laparotomy presenting to [facility] emergency department with complaints of abdominal and flank pain for several days after recently having been placed on antibiotics for suspected urinary tract infection.  Upon evaluation in the [facility] emergency department due to no available CT nudging patient was transferred to [facility] where CT imaging of the abdomen pelvis revealed a partial small bowel obstruction.  EDP discussed case with Dr. Corliss Skains with general surgery who stated they would evaluate the patient in the morning in consultation and recommended admission under the hospitalist service.  Patient was admitted to hospital service.  Patient was made n.p.o. and placed on intravenous fluids.  An NG tube was not placed due to active vomiting during an attempt in the emergency department.

## 2022-11-18 NOTE — Assessment & Plan Note (Signed)
Clinically improving Surgery consulted, their input is appreciated Soft diet initiated today Monitoring overnight and if tolerated will likely discharge tomorrow

## 2022-11-18 NOTE — Progress Notes (Signed)
Pt had multiple watery bowel movement tonight. Denies nausea at this time.

## 2022-11-19 DIAGNOSIS — N261 Atrophy of kidney (terminal): Secondary | ICD-10-CM

## 2022-11-19 DIAGNOSIS — N179 Acute kidney failure, unspecified: Secondary | ICD-10-CM | POA: Diagnosis not present

## 2022-11-19 DIAGNOSIS — E039 Hypothyroidism, unspecified: Secondary | ICD-10-CM | POA: Diagnosis not present

## 2022-11-19 DIAGNOSIS — E876 Hypokalemia: Secondary | ICD-10-CM

## 2022-11-19 DIAGNOSIS — K56609 Unspecified intestinal obstruction, unspecified as to partial versus complete obstruction: Secondary | ICD-10-CM | POA: Diagnosis not present

## 2022-11-19 DIAGNOSIS — E782 Mixed hyperlipidemia: Secondary | ICD-10-CM | POA: Diagnosis not present

## 2022-11-19 HISTORY — DX: Atrophy of kidney (terminal): N26.1

## 2022-11-19 LAB — MISC LABCORP TEST (SEND OUT): LabCorp test name: 83935

## 2022-11-19 LAB — COMPREHENSIVE METABOLIC PANEL
ALT: 11 U/L (ref 0–44)
AST: 13 U/L — ABNORMAL LOW (ref 15–41)
Albumin: 3.1 g/dL — ABNORMAL LOW (ref 3.5–5.0)
Alkaline Phosphatase: 52 U/L (ref 38–126)
Anion gap: 6 (ref 5–15)
BUN: 20 mg/dL (ref 8–23)
CO2: 26 mmol/L (ref 22–32)
Calcium: 8.4 mg/dL — ABNORMAL LOW (ref 8.9–10.3)
Chloride: 109 mmol/L (ref 98–111)
Creatinine, Ser: 1.08 mg/dL — ABNORMAL HIGH (ref 0.44–1.00)
GFR, Estimated: 57 mL/min — ABNORMAL LOW (ref >60)
Glucose, Bld: 122 mg/dL — ABNORMAL HIGH (ref 70–99)
Potassium: 3.3 mmol/L — ABNORMAL LOW (ref 3.5–5.1)
Sodium: 141 mmol/L (ref 135–145)
Total Bilirubin: 0.2 mg/dL — ABNORMAL LOW (ref 0.3–1.2)
Total Protein: 5.6 g/dL — ABNORMAL LOW (ref 6.5–8.1)

## 2022-11-19 LAB — CBC WITH DIFFERENTIAL/PLATELET
Abs Immature Granulocytes: 0.02 10*3/uL (ref 0.00–0.07)
Basophils Absolute: 0 10*3/uL (ref 0.0–0.1)
Basophils Relative: 1 %
Eosinophils Absolute: 0.1 10*3/uL (ref 0.0–0.5)
Eosinophils Relative: 4 %
HCT: 33.1 % — ABNORMAL LOW (ref 36.0–46.0)
Hemoglobin: 11.1 g/dL — ABNORMAL LOW (ref 12.0–15.0)
Immature Granulocytes: 1 %
Lymphocytes Relative: 29 %
Lymphs Abs: 1 10*3/uL (ref 0.7–4.0)
MCH: 31.5 pg (ref 26.0–34.0)
MCHC: 33.5 g/dL (ref 30.0–36.0)
MCV: 94 fL (ref 80.0–100.0)
Monocytes Absolute: 0.4 10*3/uL (ref 0.1–1.0)
Monocytes Relative: 10 %
Neutro Abs: 2 10*3/uL (ref 1.7–7.7)
Neutrophils Relative %: 55 %
Platelets: 117 10*3/uL — ABNORMAL LOW (ref 150–400)
RBC: 3.52 MIL/uL — ABNORMAL LOW (ref 3.87–5.11)
RDW: 12.7 % (ref 11.5–15.5)
WBC: 3.6 10*3/uL — ABNORMAL LOW (ref 4.0–10.5)
nRBC: 0 % (ref 0.0–0.2)

## 2022-11-19 LAB — HIV ANTIBODY (ROUTINE TESTING W REFLEX): HIV Screen 4th Generation wRfx: NONREACTIVE

## 2022-11-19 LAB — MAGNESIUM: Magnesium: 1.8 mg/dL (ref 1.7–2.4)

## 2022-11-19 MED ORDER — POTASSIUM CHLORIDE CRYS ER 20 MEQ PO TBCR
40.0000 meq | EXTENDED_RELEASE_TABLET | Freq: Once | ORAL | Status: AC
  Administered 2022-11-19: 40 meq via ORAL
  Filled 2022-11-19: qty 2

## 2022-11-19 NOTE — Plan of Care (Signed)
Patient is stable for discharge. Discharge instructions have been given, all questions answered. Patient is discharged to home with family.

## 2022-11-19 NOTE — Discharge Summary (Addendum)
Physician Discharge Summary   Patient: [name] MRN: [id number] DOB: 07-14-1955  Admit date:     11/17/2022  Discharge date: 11/19/22  Discharge Physician: Marinda Elk   PCP: [name], DO   Recommendations at discharge:   Please advance your low residue / low fiber diet as tolerated.  Literature has been attached to your after visit summary detailing some ways in which you can adhere to this diet. Please increase your physical activity as tolerated. Please maintain all outpatient follow-up appointments including follow-up with your primary care provider.  Please follow-up with your gastroenterologist in the outpatient setting for consideration of getting a colonoscopy in the outpatient setting considering your ongoing mild left lower quadrant abdominal pain. Please return to the emergency department if you develop worsening abdominal pain, inability to move your bowels, frequent vomiting, fevers in excess of 100.4 F, weakness or inability to tolerate oral intake.  Discharge Diagnoses: Principal Problem:   SBO (small bowel obstruction) (HCC) Active Problems:   AKI (acute kidney injury) (HCC)   Hypothyroidism   Mixed hyperlipidemia   Hypokalemia   Renal atrophy, left   Anxiety  Resolved Problems:   * No resolved hospital problems. *   Hospital Course: 67 year old female with past medical history of chronic kidney disease stage IIIb, cervical cancer, hypothyroidism and multiple prior surgeries including hemicolectomy, hysterectomy, cholecystectomy and exploratory laparotomy presenting to [facility] emergency department with complaints of abdominal and flank pain for several days after recently having been placed on antibiotics for suspected urinary tract infection.  Upon evaluation in the [facility] emergency department due to no available CT imaging the patient was transferred to [facility] where CT imaging of the abdomen pelvis revealed a  partial small bowel obstruction.  EDP discussed case with Dr. Corliss Skains with general surgery who agreed to consult and the hospitalist group was then called to assess the patient for admission to the hospital.  Patient was admitted to hospital service.  Patient was made n.p.o. and placed on intravenous fluids.  An NG tube was not placed due to active vomiting during an attempt in the emergency department.  Patient was therefore managed conservatively in the days that followed.  Patient's bowel obstruction clinically resolved with improving pain and tolerating oral intake.  Patient was discharged home in improved and stable condition on 11/19/2022.  Patient was advised to follow-up closely with her primary care provider.  Patient was also advised to follow-up with a gastroenterologist in the outpatient setting considering her ongoing left lower quadrant abdominal discomfort that has been ongoing for several weeks and the fact that has been greater than 10 years since last colonoscopy according to her.    Consultants: Dr. Dwain Sarna Procedures performed: None  Disposition: Home Diet recommendation:  Discharge Diet Orders (From admission, onward)     Start     Ordered   11/19/22 0000  Diet general       Comments: Low residue diet   11/19/22 1037           Low residue diet.  DISCHARGE MEDICATION: Allergies as of 11/19/2022       Reactions   Barium-containing Compounds    hives   Percocet [oxycodone-acetaminophen] Other (See Comments)   migraine         Medication List     STOP taking these medications    estradiol 0.1 MG/GM vaginal cream Commonly known as: ESTRACE   nitrofurantoin (macrocrystal-monohydrate) 100 MG capsule Commonly known as: Macrobid  TAKE these medications    cetirizine 10 MG tablet Commonly known as: ZyrTEC Allergy Take 1 tablet (10 mg total) by mouth daily.   fluticasone 50 MCG/ACT nasal spray Commonly known as: FLONASE Place 2 sprays into both  nostrils daily for 14 days.   levothyroxine 25 MCG tablet Commonly known as: SYNTHROID Take 1 tablet (25 mcg total) by mouth daily.   simvastatin 20 MG tablet Commonly known as: ZOCOR Take 1 tablet (20 mg total) by mouth daily at 6 PM.        Follow-up Information     Delynn Flavin M, DO. Schedule an appointment as soon as possible for a visit in 1 week(s).   Specialty: Family Medicine Contact information: [address] [place] [address] [phone]                 Discharge Exam: Ceasar Mons Weights   Nov 28, 2022 0943  Weight: 56.7 kg    Constitutional: Awake alert and oriented x3, no associated distress.   Respiratory: clear to auscultation bilaterally, no wheezing, no crackles. Normal respiratory effort. No accessory muscle use.  Cardiovascular: Regular rate and rhythm, no murmurs / rubs / gallops. No extremity edema. 2+ pedal pulses. No carotid bruits.  Abdomen:  Left lower quadrant mild tenderness.  Abdomen is soft.  No evidence of intra-abdominal masses.  Positive bowel sounds noted in all quadrants.   Musculoskeletal: No joint deformity upper and lower extremities. Good ROM, no contractures. Normal muscle tone.     Condition at discharge: fair  The results of significant diagnostics from this hospitalization (including imaging, microbiology, ancillary and laboratory) are listed below for reference.   Imaging Studies: DG Abd Portable 1V  Result Date: 11/18/2022 CLINICAL DATA:  Small-bowel obstruction EXAM: PORTABLE ABDOMEN - 1 VIEW COMPARISON:  Previous studies including the CT done on 11/28/2022 FINDINGS: There is dilation of small-bowel loops in left upper abdomen measuring up to 4 cm in diameter. Bowel gas pattern is otherwise unremarkable. Stomach is not distended. There is contrast in the urinary bladder from previous CT. Visualized lower lung fields are clear. Surgical clips are seen in right upper quadrant. IMPRESSION: There is dilation of proximal small  bowel loops suggesting partial small bowel obstruction. Electronically Signed   By: Ernie Avena M.D.   On: 11/18/2022 09:38   CT ABDOMEN PELVIS W CONTRAST  Result Date: Nov 28, 2022 CLINICAL DATA:  Abdominal pain.  Nausea vomiting. EXAM: CT ABDOMEN AND PELVIS WITH CONTRAST TECHNIQUE: Multidetector CT imaging of the abdomen and pelvis was performed using the standard protocol following bolus administration of intravenous contrast. RADIATION DOSE REDUCTION: This exam was performed according to the departmental dose-optimization program which includes automated exposure control, adjustment of the mA and/or kV according to patient size and/or use of iterative reconstruction technique. CONTRAST:  OMNIPAQUE IOHEXOL 300 MG/ML  SOLN COMPARISON:  09/11/2022. FINDINGS: Lower chest: Clear lung bases. Hepatobiliary: No focal liver abnormality is seen. Status post cholecystectomy. No biliary dilatation. Pancreas: Atrophy.  No mass or inflammation. Spleen: Normal in size without focal abnormality. Adrenals/Urinary Tract: Stable 2.2 cm left adrenal mass, measuring -3 Hounsfield units on the previous unenhanced CT, consistent with an adenoma. No follow-up recommended. Normal right adrenal gland. Marked left renal atrophy. Left hydronephrosis noted on the prior CT is no longer visualized. Normal right kidney. Ureters normal in course and in caliber. Bladder unremarkable. Stomach/Bowel: Dilated proximal small bowel to a maximum of 3.8 cm. There is a transition point in the left pelvis, with decompressed small  bowel distal to this. A small bowel anastomosis staple line is noted along the distal ileum in the right pelvis. Stomach mildly distended with fluid. No wall thickening or inflammation. No small bowel wall thickening. Colon is normal in caliber. No colonic wall thickening or convincing inflammation. Vascular/Lymphatic: Aortic atherosclerosis. No aneurysm. No enlarged lymph nodes. Reproductive: Status post  hysterectomy. No adnexal masses. Extensive pelvic scarring with inferior pelvic sidewall calcifications and surgical vascular clips related to treatment of cervical carcinoma, stable from the prior exam. Other: No hernia or ascites. Musculoskeletal: No fracture or acute finding. No osteoblastic or osteolytic lesions. IMPRESSION: 1. Partial small bowel obstruction, transition point in the left pelvis suspected to be due to adhesions. 2. No other acute abnormality within the abdomen or pelvis. 3. Marked left renal atrophy, stable. Hydronephrosis noted on the previous CT has resolved. 4. Stable chronic pelvic changes from treatment of cervical carcinoma. 5. Aortic atherosclerosis. Electronically Signed   By: Amie Portland M.D.   On: 11/17/2022 17:27    Microbiology: Results for orders placed or performed in visit on 10/16/22  WET PREP FOR TRICH, YEAST, CLUE     Status: None   Collection Time: 10/16/22  8:13 AM   Specimen: Vaginal Swab   Vaginal Swab  Result Value Ref Range Status   Trichomonas Exam Negative Negative Final   Yeast Exam Negative Negative Final   Clue Cell Exam Negative Negative Final    Comment: wbc:>25 bacteria:moderate epithelial cells:few   Microscopic Examination     Status: Abnormal   Collection Time: 10/16/22  8:13 AM   Urine  Result Value Ref Range Status   WBC, UA 11-30 (A) 0 - 5 /hpf Final   RBC, Urine 0-2 0 - 2 /hpf Final   Epithelial Cells (non renal) 0-10 0 - 10 /hpf Final   Renal Epithel, UA None seen None seen /hpf Final   Bacteria, UA Few (A) None seen/Few Final  Urine Culture     Status: None   Collection Time: 10/16/22  8:55 AM   Specimen: Urine   UR  Result Value Ref Range Status   Urine Culture, Routine Final report  Final   Organism ID, Bacteria Comment  Final    Comment: Mixed urogenital flora 25,000-50,000 colony forming units per mL     Labs: CBC: Recent Labs  Lab 11/17/22 1000 11/18/22 0433 11/19/22 0451  WBC 6.2 5.1 3.6*  NEUTROABS 4.9   --  2.0  HGB 14.4 11.7* 11.1*  HCT 41.0 34.5* 33.1*  MCV 91.5 96.4 94.0  PLT 169 133* 117*   Basic Metabolic Panel: Recent Labs  Lab 11/17/22 1000 11/18/22 0433 11/19/22 0451  NA 137 137 141  K 4.2 3.7 3.3*  CL 98 108 109  CO2 25 20* 26  GLUCOSE 133* 72 122*  BUN 37* 32* 20  CREATININE 1.48* 1.23* 1.08*  CALCIUM 9.6 8.3* 8.4*  MG  --   --  1.8   Liver Function Tests: Recent Labs  Lab 11/17/22 1000 11/18/22 0433 11/19/22 0451  AST 15 11* 13*  ALT 13 10 11   ALKPHOS 79 53 52  BILITOT 1.3* 0.9 0.2*  PROT 7.7 5.6* 5.6*  ALBUMIN 4.4 3.4* 3.1*   CBG: No results for input(s): "GLUCAP" in the last 168 hours.  Discharge time spent: greater than 30 minutes.  Signed: Marinda Elk, MD [facility] 11/19/2022

## 2022-11-19 NOTE — Progress Notes (Signed)
Mobility Specialist - Progress Note   11/19/22 0852  Mobility  Activity Ambulated independently in hallway  Level of Assistance Independent  Assistive Device None  Distance Ambulated (ft) 500 ft  Activity Response Tolerated well  Mobility Referral Yes  $Mobility charge 1 Mobility  Mobility Specialist Start Time (ACUTE ONLY) 0845  Mobility Specialist Stop Time (ACUTE ONLY) [id number]  Mobility Specialist Time Calculation (min) (ACUTE ONLY) 7 min   Pt received in recliner and agreeable to mobility. No complaints during session. Pt to reclinerafter session with all needs met.    [facility]

## 2022-11-19 NOTE — Progress Notes (Signed)
Subjective: Doing well today.  No complaints. Tolerating FLD + BMs yesterday  ROS: See above, otherwise other systems negative  Objective: Vital signs in last 24 hours: Temp:  [97.4 F (36.3 C)-97.8 F (36.6 C)] 97.8 F (36.6 C) (06/04 0541) Pulse Rate:  [64-74] 70 (06/04 0541) Resp:  [17-18] 17 (06/04 0541) BP: (114-131)/(57-70) 120/69 (06/04 0541) SpO2:  [99 %-100 %] 100 % (06/04 0541) Last BM Date : 11/18/22  Intake/Output from previous day: 06/03 0701 - 06/04 0700 In: 3120.6 [P.O.:600; I.V.:2520.6] Out: 300 [Urine:300] Intake/Output this shift: No intake/output data recorded.  PE: Abd: soft, NT, ND, +BS  Lab Results:  Recent Labs    11/18/22 0433 11/19/22 0451  WBC 5.1 3.6*  HGB 11.7* 11.1*  HCT 34.5* 33.1*  PLT 133* 117*   BMET Recent Labs    11/18/22 0433 11/19/22 0451  NA 137 141  K 3.7 3.3*  CL 108 109  CO2 20* 26  GLUCOSE 72 122*  BUN 32* 20  CREATININE 1.23* 1.08*  CALCIUM 8.3* 8.4*   PT/INR No results for input(s): "LABPROT", "INR" in the last 72 hours. CMP     Component Value Date/Time   NA 141 11/19/2022 0451   NA 142 07/19/2022 0852   K 3.3 (L) 11/19/2022 0451   CL 109 11/19/2022 0451   CO2 26 11/19/2022 0451   GLUCOSE 122 (H) 11/19/2022 0451   BUN 20 11/19/2022 0451   BUN 14 07/19/2022 0852   CREATININE 1.08 (H) 11/19/2022 0451   CALCIUM 8.4 (L) 11/19/2022 0451   PROT 5.6 (L) 11/19/2022 0451   PROT 5.9 (L) 07/19/2022 0852   ALBUMIN 3.1 (L) 11/19/2022 0451   ALBUMIN 4.1 07/19/2022 0852   AST 13 (L) 11/19/2022 0451   ALT 11 11/19/2022 0451   ALKPHOS 52 11/19/2022 0451   BILITOT 0.2 (L) 11/19/2022 0451   BILITOT <0.2 07/19/2022 0852   GFRNONAA 57 (L) 11/19/2022 0451   GFRAA 52 (L) 03/02/2020 1047   Lipase     Component Value Date/Time   LIPASE 22 11-28-22 1000       Studies/Results: DG Abd Portable 1V  Result Date: 11/18/2022 CLINICAL DATA:  Small-bowel obstruction EXAM: PORTABLE ABDOMEN - 1 VIEW  COMPARISON:  Previous studies including the CT done on November 28, 2022 FINDINGS: There is dilation of small-bowel loops in left upper abdomen measuring up to 4 cm in diameter. Bowel gas pattern is otherwise unremarkable. Stomach is not distended. There is contrast in the urinary bladder from previous CT. Visualized lower lung fields are clear. Surgical clips are seen in right upper quadrant. IMPRESSION: There is dilation of proximal small bowel loops suggesting partial small bowel obstruction. Electronically Signed   By: Ernie Avena M.D.   On: 11/18/2022 09:38   CT ABDOMEN PELVIS W CONTRAST  Result Date: 11-28-22 CLINICAL DATA:  Abdominal pain.  Nausea vomiting. EXAM: CT ABDOMEN AND PELVIS WITH CONTRAST TECHNIQUE: Multidetector CT imaging of the abdomen and pelvis was performed using the standard protocol following bolus administration of intravenous contrast. RADIATION DOSE REDUCTION: This exam was performed according to the departmental dose-optimization program which includes automated exposure control, adjustment of the mA and/or kV according to patient size and/or use of iterative reconstruction technique. CONTRAST:  OMNIPAQUE IOHEXOL 300 MG/ML  SOLN COMPARISON:  09/11/2022. FINDINGS: Lower chest: Clear lung bases. Hepatobiliary: No focal liver abnormality is seen. Status post cholecystectomy. No biliary dilatation. Pancreas: Atrophy.  No mass or inflammation. Spleen: Normal in size without  focal abnormality. Adrenals/Urinary Tract: Stable 2.2 cm left adrenal mass, measuring -3 Hounsfield units on the previous unenhanced CT, consistent with an adenoma. No follow-up recommended. Normal right adrenal gland. Marked left renal atrophy. Left hydronephrosis noted on the prior CT is no longer visualized. Normal right kidney. Ureters normal in course and in caliber. Bladder unremarkable. Stomach/Bowel: Dilated proximal small bowel to a maximum of 3.8 cm. There is a transition point in the left pelvis,  with decompressed small bowel distal to this. A small bowel anastomosis staple line is noted along the distal ileum in the right pelvis. Stomach mildly distended with fluid. No wall thickening or inflammation. No small bowel wall thickening. Colon is normal in caliber. No colonic wall thickening or convincing inflammation. Vascular/Lymphatic: Aortic atherosclerosis. No aneurysm. No enlarged lymph nodes. Reproductive: Status post hysterectomy. No adnexal masses. Extensive pelvic scarring with inferior pelvic sidewall calcifications and surgical vascular clips related to treatment of cervical carcinoma, stable from the prior exam. Other: No hernia or ascites. Musculoskeletal: No fracture or acute finding. No osteoblastic or osteolytic lesions. IMPRESSION: 1. Partial small bowel obstruction, transition point in the left pelvis suspected to be due to adhesions. 2. No other acute abnormality within the abdomen or pelvis. 3. Marked left renal atrophy, stable. Hydronephrosis noted on the previous CT has resolved. 4. Stable chronic pelvic changes from treatment of cervical carcinoma. 5. Aortic atherosclerosis. Electronically Signed   By: Amie Portland M.D.   On: 11/17/2022 17:27    Anti-infectives: Anti-infectives (From admission, onward)    None        Assessment/Plan SBO -resolved -soft diet, if tolerates, may DC home from surgical standpoint -discussed with primary service   FEN - soft VTE - Lovenox ID - none needed  I reviewed hospitalist notes, last 24 h vitals and pain scores, last 48 h intake and output, last 24 h labs and trends, and last 24 h imaging results.   LOS: 2 days    Letha Cape , [facility] 11/19/2022, 8:19 AM Please see Amion for pager number during day hours 7:00am-4:30pm or 7:00am -11:30am on weekends

## 2022-11-20 ENCOUNTER — Emergency Department (HOSPITAL_COMMUNITY): Payer: Medicare Other

## 2022-11-20 ENCOUNTER — Inpatient Hospital Stay (HOSPITAL_COMMUNITY): Payer: Medicare Other

## 2022-11-20 ENCOUNTER — Other Ambulatory Visit: Payer: Self-pay

## 2022-11-20 ENCOUNTER — Encounter (HOSPITAL_COMMUNITY): Payer: Self-pay | Admitting: Radiology

## 2022-11-20 ENCOUNTER — Inpatient Hospital Stay (HOSPITAL_COMMUNITY)
Admission: EM | Admit: 2022-11-20 | Discharge: 2022-11-24 | DRG: 389 | Disposition: A | Discharge status: Home / Self Care | Payer: Medicare Other | Attending: Internal Medicine | Admitting: Internal Medicine

## 2022-11-20 DIAGNOSIS — N183 Chronic kidney disease, stage 3 unspecified: Secondary | ICD-10-CM | POA: Diagnosis present

## 2022-11-20 DIAGNOSIS — E785 Hyperlipidemia, unspecified: Secondary | ICD-10-CM | POA: Diagnosis not present

## 2022-11-20 DIAGNOSIS — Z885 Allergy status to narcotic agent status: Secondary | ICD-10-CM | POA: Diagnosis not present

## 2022-11-20 DIAGNOSIS — Z79890 Hormone replacement therapy (postmenopausal): Secondary | ICD-10-CM | POA: Diagnosis not present

## 2022-11-20 DIAGNOSIS — T8089XA Other complications following infusion, transfusion and therapeutic injection, initial encounter: Secondary | ICD-10-CM | POA: Diagnosis not present

## 2022-11-20 DIAGNOSIS — Z87891 Personal history of nicotine dependence: Secondary | ICD-10-CM | POA: Diagnosis not present

## 2022-11-20 DIAGNOSIS — Z90710 Acquired absence of both cervix and uterus: Secondary | ICD-10-CM | POA: Diagnosis not present

## 2022-11-20 DIAGNOSIS — N1832 Chronic kidney disease, stage 3b: Secondary | ICD-10-CM | POA: Diagnosis not present

## 2022-11-20 DIAGNOSIS — R413 Other amnesia: Secondary | ICD-10-CM | POA: Diagnosis present

## 2022-11-20 DIAGNOSIS — E039 Hypothyroidism, unspecified: Secondary | ICD-10-CM | POA: Diagnosis not present

## 2022-11-20 DIAGNOSIS — G250 Essential tremor: Secondary | ICD-10-CM | POA: Diagnosis not present

## 2022-11-20 DIAGNOSIS — Z833 Family history of diabetes mellitus: Secondary | ICD-10-CM | POA: Diagnosis not present

## 2022-11-20 DIAGNOSIS — K56600 Partial intestinal obstruction, unspecified as to cause: Principal | ICD-10-CM

## 2022-11-20 DIAGNOSIS — Z888 Allergy status to other drugs, medicaments and biological substances status: Secondary | ICD-10-CM | POA: Diagnosis not present

## 2022-11-20 DIAGNOSIS — F05 Delirium due to known physiological condition: Secondary | ICD-10-CM | POA: Diagnosis not present

## 2022-11-20 DIAGNOSIS — K5651 Intestinal adhesions [bands], with partial obstruction: Principal | ICD-10-CM | POA: Diagnosis present

## 2022-11-20 DIAGNOSIS — M7989 Other specified soft tissue disorders: Secondary | ICD-10-CM | POA: Diagnosis not present

## 2022-11-20 DIAGNOSIS — Z9049 Acquired absence of other specified parts of digestive tract: Secondary | ICD-10-CM

## 2022-11-20 DIAGNOSIS — Z79899 Other long term (current) drug therapy: Secondary | ICD-10-CM | POA: Diagnosis not present

## 2022-11-20 DIAGNOSIS — R410 Disorientation, unspecified: Secondary | ICD-10-CM | POA: Clinically undetermined

## 2022-11-20 DIAGNOSIS — R1110 Vomiting, unspecified: Secondary | ICD-10-CM | POA: Diagnosis not present

## 2022-11-20 DIAGNOSIS — K56609 Unspecified intestinal obstruction, unspecified as to partial versus complete obstruction: Secondary | ICD-10-CM | POA: Diagnosis not present

## 2022-11-20 DIAGNOSIS — Z8541 Personal history of malignant neoplasm of cervix uteri: Secondary | ICD-10-CM | POA: Diagnosis not present

## 2022-11-20 DIAGNOSIS — R4189 Other symptoms and signs involving cognitive functions and awareness: Secondary | ICD-10-CM | POA: Diagnosis present

## 2022-11-20 DIAGNOSIS — R8290 Unspecified abnormal findings in urine: Secondary | ICD-10-CM | POA: Diagnosis present

## 2022-11-20 DIAGNOSIS — Z4659 Encounter for fitting and adjustment of other gastrointestinal appliance and device: Secondary | ICD-10-CM | POA: Diagnosis not present

## 2022-11-20 DIAGNOSIS — Z923 Personal history of irradiation: Secondary | ICD-10-CM | POA: Diagnosis not present

## 2022-11-20 DIAGNOSIS — R609 Edema, unspecified: Secondary | ICD-10-CM | POA: Diagnosis not present

## 2022-11-20 DIAGNOSIS — K5900 Constipation, unspecified: Secondary | ICD-10-CM | POA: Diagnosis present

## 2022-11-20 DIAGNOSIS — Z8249 Family history of ischemic heart disease and other diseases of the circulatory system: Secondary | ICD-10-CM | POA: Diagnosis not present

## 2022-11-20 DIAGNOSIS — F419 Anxiety disorder, unspecified: Secondary | ICD-10-CM | POA: Diagnosis present

## 2022-11-20 DIAGNOSIS — R1032 Left lower quadrant pain: Secondary | ICD-10-CM | POA: Diagnosis not present

## 2022-11-20 DIAGNOSIS — K6389 Other specified diseases of intestine: Secondary | ICD-10-CM | POA: Diagnosis not present

## 2022-11-20 DIAGNOSIS — K5669 Other partial intestinal obstruction: Secondary | ICD-10-CM | POA: Diagnosis not present

## 2022-11-20 HISTORY — DX: Malignant neoplasm of cervix uteri, unspecified: C53.9

## 2022-11-20 LAB — COMPREHENSIVE METABOLIC PANEL
ALT: 14 U/L (ref 0–44)
AST: 15 U/L (ref 15–41)
Albumin: 3.9 g/dL (ref 3.5–5.0)
Alkaline Phosphatase: 62 U/L (ref 38–126)
Anion gap: 11 (ref 5–15)
BUN: 15 mg/dL (ref 8–23)
CO2: 24 mmol/L (ref 22–32)
Calcium: 9.2 mg/dL (ref 8.9–10.3)
Chloride: 107 mmol/L (ref 98–111)
Creatinine, Ser: 1.32 mg/dL — ABNORMAL HIGH (ref 0.44–1.00)
GFR, Estimated: 45 mL/min — ABNORMAL LOW (ref >60)
Glucose, Bld: 157 mg/dL — ABNORMAL HIGH (ref 70–99)
Potassium: 4 mmol/L (ref 3.5–5.1)
Sodium: 142 mmol/L (ref 135–145)
Total Bilirubin: 0.3 mg/dL (ref 0.3–1.2)
Total Protein: 6.6 g/dL (ref 6.5–8.1)

## 2022-11-20 LAB — CBC WITH DIFFERENTIAL/PLATELET
Abs Immature Granulocytes: 0.02 10*3/uL (ref 0.00–0.07)
Basophils Absolute: 0 10*3/uL (ref 0.0–0.1)
Basophils Relative: 0 %
Eosinophils Absolute: 0 10*3/uL (ref 0.0–0.5)
Eosinophils Relative: 0 %
HCT: 32.5 % — ABNORMAL LOW (ref 36.0–46.0)
Hemoglobin: 11 g/dL — ABNORMAL LOW (ref 12.0–15.0)
Immature Granulocytes: 0 %
Lymphocytes Relative: 8 %
Lymphs Abs: 0.4 10*3/uL — ABNORMAL LOW (ref 0.7–4.0)
MCH: 32.3 pg (ref 26.0–34.0)
MCHC: 33.8 g/dL (ref 30.0–36.0)
MCV: 95.3 fL (ref 80.0–100.0)
Monocytes Absolute: 0.2 10*3/uL (ref 0.1–1.0)
Monocytes Relative: 3 %
Neutro Abs: 4.7 10*3/uL (ref 1.7–7.7)
Neutrophils Relative %: 89 %
Platelets: 126 10*3/uL — ABNORMAL LOW (ref 150–400)
RBC: 3.41 MIL/uL — ABNORMAL LOW (ref 3.87–5.11)
RDW: 12.8 % (ref 11.5–15.5)
WBC: 5.4 10*3/uL (ref 4.0–10.5)
nRBC: 0 % (ref 0.0–0.2)

## 2022-11-20 LAB — URINALYSIS, ROUTINE W REFLEX MICROSCOPIC
Bilirubin Urine: NEGATIVE
Glucose, UA: NEGATIVE mg/dL
Hgb urine dipstick: NEGATIVE
Ketones, ur: NEGATIVE mg/dL
Nitrite: NEGATIVE
Protein, ur: 30 mg/dL — AB
Specific Gravity, Urine: 1.024 (ref 1.005–1.030)
WBC, UA: >50 WBC/hpf (ref 0–5)
pH: 5 (ref 5.0–8.0)

## 2022-11-20 LAB — LIPASE, BLOOD: Lipase: 23 U/L (ref 11–51)

## 2022-11-20 MED ORDER — LACTATED RINGERS IV SOLN: INTRAVENOUS | Status: DC

## 2022-11-20 MED ORDER — MENTHOL 3 MG MT LOZG
1.0000 | LOZENGE | OROMUCOSAL | Status: DC | PRN
  Filled 2022-11-20: qty 9

## 2022-11-20 MED ORDER — ACETAMINOPHEN 325 MG PO TABS: 650.0000 mg | ORAL_TABLET | Freq: Four times a day (QID) | ORAL | Status: DC | PRN

## 2022-11-20 MED ORDER — MORPHINE SULFATE (PF) 2 MG/ML IV SOLN
2.0000 mg | INTRAVENOUS | Status: DC | PRN
  Administered 2022-11-20: 2 mg via INTRAVENOUS
  Filled 2022-11-20: qty 1

## 2022-11-20 MED ORDER — ALUM & MAG HYDROXIDE-SIMETH 200-200-20 MG/5ML PO SUSP: 30.0000 mL | Freq: Four times a day (QID) | ORAL | Status: DC | PRN

## 2022-11-20 MED ORDER — DEXTROSE-SODIUM CHLORIDE 5-0.9 % IV SOLN: INTRAVENOUS | Status: DC

## 2022-11-20 MED ORDER — METHOCARBAMOL 1000 MG/10ML IJ SOLN
1000.0000 mg | Freq: Four times a day (QID) | INTRAVENOUS | Status: DC | PRN
  Filled 2022-11-20: qty 10

## 2022-11-20 MED ORDER — DIATRIZOATE MEGLUMINE & SODIUM 66-10 % PO SOLN
90.0000 mL | Freq: Once | ORAL | Status: AC
  Administered 2022-11-20: 90 mL via NASOGASTRIC
  Filled 2022-11-20: qty 90

## 2022-11-20 MED ORDER — LACTATED RINGERS IV BOLUS: 1000.0000 mL | Freq: Three times a day (TID) | INTRAVENOUS | Status: DC | PRN

## 2022-11-20 MED ORDER — ENOXAPARIN SODIUM 40 MG/0.4ML IJ SOSY
40.0000 mg | PREFILLED_SYRINGE | Freq: Every day | INTRAMUSCULAR | Status: DC
  Administered 2022-11-20 – 2022-11-23 (×4): 40 mg via SUBCUTANEOUS
  Filled 2022-11-20 (×4): qty 0.4

## 2022-11-20 MED ORDER — MORPHINE SULFATE (PF) 4 MG/ML IV SOLN
4.0000 mg | Freq: Once | INTRAVENOUS | Status: AC
  Administered 2022-11-20: 4 mg via INTRAVENOUS
  Filled 2022-11-20: qty 1

## 2022-11-20 MED ORDER — PHENOL 1.4 % MT LIQD
2.0000 | OROMUCOSAL | Status: DC | PRN
  Administered 2022-11-21: 2 via OROMUCOSAL

## 2022-11-20 MED ORDER — ONDANSETRON HCL 4 MG/2ML IJ SOLN
4.0000 mg | Freq: Once | INTRAMUSCULAR | Status: AC
  Administered 2022-11-20: 4 mg via INTRAVENOUS
  Filled 2022-11-20: qty 2

## 2022-11-20 MED ORDER — ACETAMINOPHEN 650 MG RE SUPP
650.0000 mg | Freq: Four times a day (QID) | RECTAL | Status: DC | PRN
  Administered 2022-11-21: 650 mg via RECTAL
  Filled 2022-11-20: qty 1

## 2022-11-20 MED ORDER — IOHEXOL 300 MG/ML  SOLN
80.0000 mL | Freq: Once | INTRAMUSCULAR | Status: AC | PRN
  Administered 2022-11-20: 80 mL via INTRAVENOUS

## 2022-11-20 MED ORDER — SODIUM CHLORIDE 0.9 % IV SOLN: 8.0000 mg | Freq: Four times a day (QID) | INTRAVENOUS | Status: DC | PRN

## 2022-11-20 MED ORDER — HYDRALAZINE HCL 20 MG/ML IJ SOLN: 10.0000 mg | Freq: Four times a day (QID) | INTRAMUSCULAR | Status: DC | PRN

## 2022-11-20 MED ORDER — BISACODYL 10 MG RE SUPP: 10.0000 mg | Freq: Two times a day (BID) | RECTAL | Status: DC | PRN

## 2022-11-20 MED ORDER — DIPHENHYDRAMINE HCL 50 MG/ML IJ SOLN: 12.5000 mg | Freq: Four times a day (QID) | INTRAMUSCULAR | Status: DC | PRN

## 2022-11-20 MED ORDER — MAGIC MOUTHWASH: 15.0000 mL | Freq: Four times a day (QID) | ORAL | Status: DC | PRN

## 2022-11-20 MED ORDER — SODIUM CHLORIDE 0.9 % IV SOLN
1.0000 g | Freq: Once | INTRAVENOUS | Status: AC
  Administered 2022-11-20: 1 g via INTRAVENOUS
  Filled 2022-11-20: qty 10

## 2022-11-20 MED ORDER — LACTATED RINGERS IV BOLUS
1000.0000 mL | Freq: Once | INTRAVENOUS | Status: AC
  Administered 2022-11-20: 1000 mL via INTRAVENOUS

## 2022-11-20 MED ORDER — PROCHLORPERAZINE EDISYLATE 10 MG/2ML IJ SOLN: 5.0000 mg | INTRAMUSCULAR | Status: DC | PRN

## 2022-11-20 MED ORDER — LIP MEDEX EX OINT
TOPICAL_OINTMENT | Freq: Two times a day (BID) | CUTANEOUS | Status: DC
  Administered 2022-11-20 – 2022-11-21 (×2): 75 via TOPICAL
  Administered 2022-11-22: 1 via TOPICAL
  Administered 2022-11-23: 75 via TOPICAL
  Filled 2022-11-20 (×2): qty 7

## 2022-11-20 MED ORDER — SODIUM CHLORIDE (PF) 0.9 % IJ SOLN
INTRAMUSCULAR | Status: AC
  Filled 2022-11-20: qty 50

## 2022-11-20 MED ORDER — SODIUM CHLORIDE 0.9 % IV BOLUS
1000.0000 mL | Freq: Once | INTRAVENOUS | Status: AC
  Administered 2022-11-20: 1000 mL via INTRAVENOUS

## 2022-11-20 MED ORDER — ALBUTEROL SULFATE (2.5 MG/3ML) 0.083% IN NEBU
2.5000 mg | INHALATION_SOLUTION | Freq: Four times a day (QID) | RESPIRATORY_TRACT | Status: DC
  Administered 2022-11-20: 2.5 mg via RESPIRATORY_TRACT
  Filled 2022-11-20: qty 3

## 2022-11-20 MED ORDER — SIMETHICONE 40 MG/0.6ML PO SUSP: 80.0000 mg | Freq: Four times a day (QID) | ORAL | Status: DC | PRN

## 2022-11-20 MED ORDER — ONDANSETRON HCL 4 MG/2ML IJ SOLN: 4.0000 mg | Freq: Four times a day (QID) | INTRAMUSCULAR | Status: DC | PRN

## 2022-11-20 MED ORDER — ALBUTEROL SULFATE (2.5 MG/3ML) 0.083% IN NEBU: 2.5000 mg | INHALATION_SOLUTION | Freq: Four times a day (QID) | RESPIRATORY_TRACT | Status: DC | PRN

## 2022-11-20 NOTE — ED Triage Notes (Signed)
Pt discharged from here inpatient on Tues after SBO that seemed to have resolved with bowel rest.  Pt has been unable to tolerate PO intake today. Left lower quadrant pain.  Pt is actively vomiting in triage.

## 2022-11-20 NOTE — ED Notes (Signed)
ED TO INPATIENT HANDOFF REPORT  Name/Age/Gender [name] 67 y.o. female  Code Status    Code Status Orders  (From admission, onward)           Start     Ordered   11/20/22 1726  Full code  Continuous       Question:  By:  Answer:  Consent: discussion documented in EHR   11/20/22 1726           Code Status History     Date Active Date Inactive Code Status Order ID Comments User Context   11/17/2022 2057 11/19/2022 1607 Full Code [id number]  Rometta Emery, MD ED      Advance Directive Documentation    Flowsheet Row Most Recent Value  Type of Advance Directive Living will  Pre-existing out of facility DNR order (yellow form or pink MOST form) --  "MOST" Form in Place? --       Home/SNF/Other Home  Chief Complaint SBO (small bowel obstruction) (HCC) [K56.609]  Level of Care/Admitting Diagnosis ED Disposition     ED Disposition  Admit   Condition  --   Comment  Hospital Area: [facility] [[id number]]  Level of Care: Med-Surg [16]  May admit patient to [facility] or [facility] if equivalent level of care is available:: Yes  Covid Evaluation: Asymptomatic - no recent exposure (last 10 days) testing not required  Diagnosis: SBO (small bowel obstruction) [facility]) [[id number]]  Admitting Physician: [name] [[id number]]  Attending Physician: [name] [[id number]]  Certification:: I certify this patient will need inpatient services for at least 2 midnights  Estimated Length of Stay: 2          Medical History Past Medical History:  Diagnosis Date   Atrophic kidney    Cancer (HCC)    cervical 2003   Colovaginal fistula    Pyelonephritis    Thyroid disease    Vertigo     Allergies Allergies  Allergen Reactions   Barium-Containing Compounds     hives   Percocet [Oxycodone-Acetaminophen] Other (See Comments)    migraine     IV Location/Drains/Wounds Patient Lines/Drains/Airways Status     Active Line/Drains/Airways      Name Placement date Placement time Site Days   Peripheral IV 11/20/22 20 G 1.88" Anterior;Left;Upper Arm 11/20/22  1506  Arm  less than 1            Labs/Imaging Results for orders placed or performed during the hospital encounter of 11/20/22 (from the past 48 hour(s))  Comprehensive metabolic panel     Status: Abnormal   Collection Time: 11/20/22  2:30 PM  Result Value Ref Range   Sodium 142 135 - 145 mmol/L   Potassium 4.0 3.5 - 5.1 mmol/L   Chloride 107 98 - 111 mmol/L   CO2 24 22 - 32 mmol/L   Glucose, Bld 157 (H) 70 - 99 mg/dL    Comment: Glucose reference range applies only to samples taken after fasting for at least 8 hours.   BUN 15 8 - 23 mg/dL   Creatinine, Ser 4.13 (H) 0.44 - 1.00 mg/dL   Calcium 9.2 8.9 - 24.4 mg/dL   Total Protein 6.6 6.5 - 8.1 g/dL   Albumin 3.9 3.5 - 5.0 g/dL   AST 15 15 - 41 U/L   ALT 14 0 - 44 U/L   Alkaline Phosphatase 62 38 - 126 U/L   Total Bilirubin 0.3 0.3 - 1.2 mg/dL  GFR, Estimated 45 (L) >60 mL/min    Comment: (NOTE) Calculated using the CKD-EPI Creatinine Equation (2021)    Anion gap 11 5 - 15    Comment: Performed at [facility], [address], [place], [place] [address]  Lipase, blood     Status: None   Collection Time: 11/20/22  2:30 PM  Result Value Ref Range   Lipase 23 11 - 51 U/L    Comment: Performed at [facility], [address], [place], [place] [address]  Urinalysis, Routine w reflex microscopic -Urine, Clean Catch     Status: Abnormal   Collection Time: 11/20/22  3:54 PM  Result Value Ref Range   Color, Urine YELLOW YELLOW   APPearance HAZY (A) CLEAR   Specific Gravity, Urine 1.024 1.005 - 1.030   pH 5.0 5.0 - 8.0   Glucose, UA NEGATIVE NEGATIVE mg/dL   Hgb urine dipstick NEGATIVE NEGATIVE   Bilirubin Urine NEGATIVE NEGATIVE   Ketones, ur NEGATIVE NEGATIVE mg/dL   Protein, ur 30 (A) NEGATIVE mg/dL   Nitrite NEGATIVE NEGATIVE   Leukocytes,Ua LARGE (A) NEGATIVE    RBC / HPF 11-20 0 - 5 RBC/hpf   WBC, UA >50 0 - 5 WBC/hpf   Bacteria, UA FEW (A) NONE SEEN   Squamous Epithelial / HPF 0-5 0 - 5 /HPF   Mucus PRESENT    Non Squamous Epithelial 0-5 (A) NONE SEEN    Comment: Performed at [facility], [address], [place], [place] [address]  CBC with Differential/Platelet     Status: Abnormal   Collection Time: 11/20/22  4:30 PM  Result Value Ref Range   WBC 5.4 4.0 - 10.5 K/uL   RBC 3.41 (L) 3.87 - 5.11 MIL/uL   Hemoglobin 11.0 (L) 12.0 - 15.0 g/dL   HCT 91.4 (L) 78.2 - 95.6 %   MCV 95.3 80.0 - 100.0 fL   MCH 32.3 26.0 - 34.0 pg   MCHC 33.8 30.0 - 36.0 g/dL   RDW 21.3 08.6 - 57.8 %   Platelets 126 (L) 150 - 400 K/uL   nRBC 0.0 0.0 - 0.2 %   Neutrophils Relative % 89 %   Neutro Abs 4.7 1.7 - 7.7 K/uL   Lymphocytes Relative 8 %   Lymphs Abs 0.4 (L) 0.7 - 4.0 K/uL   Monocytes Relative 3 %   Monocytes Absolute 0.2 0.1 - 1.0 K/uL   Eosinophils Relative 0 %   Eosinophils Absolute 0.0 0.0 - 0.5 K/uL   Basophils Relative 0 %   Basophils Absolute 0.0 0.0 - 0.1 K/uL   Immature Granulocytes 0 %   Abs Immature Granulocytes 0.02 0.00 - 0.07 K/uL    Comment: Performed at [facility], [address], [place], [place] [address]   CT ABDOMEN PELVIS W CONTRAST  Result Date: 11/20/2022 CLINICAL DATA:  Bowel obstruction suspected EXAM: CT ABDOMEN AND PELVIS WITH CONTRAST TECHNIQUE: Multidetector CT imaging of the abdomen and pelvis was performed using the standard protocol following bolus administration of intravenous contrast. RADIATION DOSE REDUCTION: This exam was performed according to the departmental dose-optimization program which includes automated exposure control, adjustment of the mA and/or kV according to patient size and/or use of iterative reconstruction technique. CONTRAST:  80mL OMNIPAQUE IOHEXOL 300 MG/ML  SOLN COMPARISON:  Previous studies including the examination of 11/17/2022 FINDINGS: Lower chest:  Visualized lower lung fields are clear. Hepatobiliary: Surgical clips are seen in gallbladder fossa. Intrahepatic bile ducts appear more  prominent in comparison with the previous study. Distal common bile duct in the head of the pancreas measures 8 mm. Pancreas: There is atrophy.  No focal abnormalities are seen. Spleen: Unremarkable. Adrenals/Urinary Tract: There is 2.1 cm nodule in left adrenal which appears stable, possibly adenoma. There is a 9 mm nodule in the right adrenal which appears stable. Left kidneys much smaller than right. There are foci of parenchymal thinning at multiple sites in the left kidney suggesting scarring. There is no significant hydronephrosis. There is prominence of left renal pelvis without significant dilation of minor calyces. There are no renal or ureteral stones. Urinary bladder is not distended. Stomach/Bowel: Small hiatal hernia is seen. Stomach is not distended. There is dilation of few proximal small bowel loops measuring up to 3.3 cm in diameter. There is interval decrease in degree of proximal small bowel dilation. Distal small-bowel loops are decompressed. Zone of transition appears to be in left side of pelvis. Gas and stool are present in the lumen of colon. There is wall thickening in rectum which appears to be chronic. Minimal ascites is present adjacent to the proximal descending colon. There is no loculated pericolic fluid collection. Vascular/Lymphatic: Scattered arterial calcifications are seen. Reproductive: Uterus is not seen. Surgical clips are seen in pelvis. There are coarse calcifications in both sides of pelvis. Prominence of perirectal soft tissues has not changed. These findings may be residual from previous intervention such as surgery and radiation. Other: There is no pneumoperitoneum. Minimal amount of ascites is noted in left mid abdomen adjacent to the descending colon. Musculoskeletal: No acute findings are seen. IMPRESSION: There is interval decrease  in degree of proximal small bowel dilation. Still, there is moderate dilation of proximal small bowel loops with decompression of distal small bowel loops suggesting partial small bowel obstruction due to adhesions. There is no hydronephrosis. There is atrophy and cortical thinning in the left kidney with no interval change. Nodules are seen in both adrenals with no interval change. Intrahepatic bile ducts appear more prominent. This may be related to previous cholecystectomy. Less likely possibility would be stricture in the distal common bile duct. Chronic changes in the pelvis may be due to previous surgery and possibly radiation treatment. Small hiatal hernia. Aortic atherosclerosis. Electronically Signed   By: Ernie Avena M.D.   On: 11/20/2022 16:43    Pending Labs Unresulted Labs (From admission, onward)     Start     Ordered   11/21/22 0500  CBC  Tomorrow morning,   R        11/20/22 1706   11/21/22 0500  Basic metabolic panel  Tomorrow morning,   R        11/20/22 1706   11/21/22 0500  Magnesium  Tomorrow morning,   R        11/20/22 1726   11/21/22 0500  Phosphorus  Tomorrow morning,   R        11/20/22 1726   11/20/22 1719  Urine Culture  Once,   URGENT       Question:  Indication  Answer:  Dysuria   11/20/22 1718   11/20/22 1411  CBC with Differential  Once,   STAT        11/20/22 1410            Vitals/Pain Today's Vitals   11/20/22 1334 11/20/22 1348 11/20/22 1410 11/20/22 1540  BP: 110/72  105/76   Pulse: 65  74   Resp: 16  (!) 21  Temp: 98 F (36.7 C)     TempSrc: Oral     SpO2: 100%  100%   PainSc:  0-No pain  1     Isolation Precautions No active isolations  Medications Medications  diatrizoate meglumine-sodium (GASTROGRAFIN) 66-10 % solution 90 mL (has no administration in time range)  lactated ringers bolus 1,000 mL (has no administration in time range)  lactated ringers infusion (has no administration in time range)  lactated ringers bolus  1,000 mL (has no administration in time range)  methocarbamol (ROBAXIN) 1,000 mg in dextrose 5 % 100 mL IVPB (has no administration in time range)  ondansetron (ZOFRAN) injection 4 mg (has no administration in time range)    Or  ondansetron (ZOFRAN) 8 mg in sodium chloride 0.9 % 50 mL IVPB (has no administration in time range)  prochlorperazine (COMPAZINE) injection 5-10 mg (has no administration in time range)  bisacodyl (DULCOLAX) suppository 10 mg (has no administration in time range)  lip balm (CARMEX) ointment (has no administration in time range)  phenol (CHLORASEPTIC) mouth spray 2 spray (has no administration in time range)  menthol-cetylpyridinium (CEPACOL) lozenge 3 mg (has no administration in time range)  magic mouthwash (has no administration in time range)  alum & mag hydroxide-simeth (MAALOX/MYLANTA) 200-200-20 MG/5ML suspension 30 mL (has no administration in time range)  simethicone (MYLICON) 40 MG/0.6ML suspension 80 mg (has no administration in time range)  diphenhydrAMINE (BENADRYL) injection 12.5-25 mg (has no administration in time range)  cefTRIAXone (ROCEPHIN) 1 g in sodium chloride 0.9 % 100 mL IVPB (has no administration in time range)  acetaminophen (TYLENOL) tablet 650 mg (has no administration in time range)    Or  acetaminophen (TYLENOL) suppository 650 mg (has no administration in time range)  albuterol (PROVENTIL) (2.5 MG/3ML) 0.083% nebulizer solution 2.5 mg (has no administration in time range)  hydrALAZINE (APRESOLINE) injection 10 mg (has no administration in time range)  enoxaparin (LOVENOX) injection 40 mg (has no administration in time range)  dextrose 5 %-0.9 % sodium chloride infusion (has no administration in time range)  sodium chloride 0.9 % bolus 1,000 mL (0 mLs Intravenous Stopped 11/20/22 1542)  ondansetron (ZOFRAN) injection 4 mg (4 mg Intravenous Given 11/20/22 1507)  morphine (PF) 4 MG/ML injection 4 mg (4 mg Intravenous Given 11/20/22 1508)   iohexol (OMNIPAQUE) 300 MG/ML solution 80 mL (80 mLs Intravenous Contrast Given 11/20/22 1558)    Mobility walks

## 2022-11-20 NOTE — ED Provider Notes (Signed)
[place] EMERGENCY DEPARTMENT AT [facility] Provider Note   CSN: [id number] Arrival date & time: 11/20/22  1327     History  Chief Complaint  Patient presents with   Abdominal Pain   Emesis    [name] is a 67 y.o. female with a past medical history significant for CKD stage IIIb, cervical cancer, hypothyroidism, and multiple prior abdominal surgeries who presents to the ED due to left lower quadrant abdominal pain associated with roughly 12 episodes of brown emesis that started earlier today.  Patient recently admitted to the hospital for a small bowel obstruction and discharged yesterday.  Patient has had a normal diet since being discharged.  Denies any bowel movements today. Not passing any gas. She is concerned about another bowel obstruction. No fever or chills. Denies chest pain or shortness of breath.   History obtained from patient and past medical records. No interpreter used during encounter.       Home Medications Prior to Admission medications   Medication Sig Start Date End Date Taking? Authorizing Provider  cetirizine (ZYRTEC ALLERGY) 10 MG tablet Take 1 tablet (10 mg total) by mouth daily. 08/30/22   Fondaw, Rodrigo Ran, PA  fluticasone (FLONASE) 50 MCG/ACT nasal spray Place 2 sprays into both nostrils daily for 14 days. 08/30/22 11/17/22  Gailen Shelter, PA  levothyroxine (SYNTHROID) 25 MCG tablet Take 1 tablet (25 mcg total) by mouth daily. 07/17/22   [name], DO  simvastatin (ZOCOR) 20 MG tablet Take 1 tablet (20 mg total) by mouth daily at 6 PM. 09/23/22   Delynn Flavin M, DO      Allergies    Barium-containing compounds and Percocet [oxycodone-acetaminophen]    Review of Systems   Review of Systems  Constitutional:  Negative for chills and fever.  Respiratory:  Negative for shortness of breath.   Cardiovascular:  Negative for chest pain.  Gastrointestinal:  Positive for abdominal pain, nausea and vomiting. Negative for diarrhea.   Genitourinary:  Negative for dysuria.  Musculoskeletal:  Negative for back pain.    Physical Exam Updated Vital Signs BP 105/76   Pulse 74   Temp 98 F (36.7 C) (Oral)   Resp (!) 21   SpO2 100%  Physical Exam Vitals and nursing note reviewed.  Constitutional:      General: She is not in acute distress.    Appearance: She is not ill-appearing.  HENT:     Head: Normocephalic.  Eyes:     Pupils: Pupils are equal, round, and reactive to light.  Cardiovascular:     Rate and Rhythm: Normal rate and regular rhythm.     Pulses: Normal pulses.     Heart sounds: Normal heart sounds. No murmur heard.    No friction rub. No gallop.  Pulmonary:     Effort: Pulmonary effort is normal.     Breath sounds: Normal breath sounds.  Abdominal:     General: Abdomen is flat. There is no distension.     Palpations: Abdomen is soft.     Tenderness: There is abdominal tenderness. There is no guarding or rebound.     Comments: TTP in LLQ  Musculoskeletal:        General: Normal range of motion.     Cervical back: Neck supple.  Skin:    General: Skin is warm and dry.  Neurological:     General: No focal deficit present.     Mental Status: She is alert.  Psychiatric:  Mood and Affect: Mood normal.        Behavior: Behavior normal.     ED Results / Procedures / Treatments   Labs (all labs ordered are listed, but only abnormal results are displayed) Labs Reviewed  COMPREHENSIVE METABOLIC PANEL - Abnormal; Notable for the following components:      Result Value   Glucose, Bld 157 (*)    Creatinine, Ser 1.32 (*)    GFR, Estimated 45 (*)    All other components within normal limits  URINALYSIS, ROUTINE W REFLEX MICROSCOPIC - Abnormal; Notable for the following components:   APPearance HAZY (*)    Protein, ur 30 (*)    Leukocytes,Ua LARGE (*)    Bacteria, UA FEW (*)    Non Squamous Epithelial 0-5 (*)    All other components within normal limits  CBC WITH DIFFERENTIAL/PLATELET -  Abnormal; Notable for the following components:   RBC 3.41 (*)    Hemoglobin 11.0 (*)    HCT 32.5 (*)    Platelets 126 (*)    Lymphs Abs 0.4 (*)    All other components within normal limits  URINE CULTURE  LIPASE, BLOOD  CBC WITH DIFFERENTIAL/PLATELET  CBC  BASIC METABOLIC PANEL    EKG None  Radiology CT ABDOMEN PELVIS W CONTRAST  Result Date: 11/20/2022 CLINICAL DATA:  Bowel obstruction suspected EXAM: CT ABDOMEN AND PELVIS WITH CONTRAST TECHNIQUE: Multidetector CT imaging of the abdomen and pelvis was performed using the standard protocol following bolus administration of intravenous contrast. RADIATION DOSE REDUCTION: This exam was performed according to the departmental dose-optimization program which includes automated exposure control, adjustment of the mA and/or kV according to patient size and/or use of iterative reconstruction technique. CONTRAST:  80mL OMNIPAQUE IOHEXOL 300 MG/ML  SOLN COMPARISON:  Previous studies including the examination of 11/17/2022 FINDINGS: Lower chest: Visualized lower lung fields are clear. Hepatobiliary: Surgical clips are seen in gallbladder fossa. Intrahepatic bile ducts appear more prominent in comparison with the previous study. Distal common bile duct in the head of the pancreas measures 8 mm. Pancreas: There is atrophy.  No focal abnormalities are seen. Spleen: Unremarkable. Adrenals/Urinary Tract: There is 2.1 cm nodule in left adrenal which appears stable, possibly adenoma. There is a 9 mm nodule in the right adrenal which appears stable. Left kidneys much smaller than right. There are foci of parenchymal thinning at multiple sites in the left kidney suggesting scarring. There is no significant hydronephrosis. There is prominence of left renal pelvis without significant dilation of minor calyces. There are no renal or ureteral stones. Urinary bladder is not distended. Stomach/Bowel: Small hiatal hernia is seen. Stomach is not distended. There is  dilation of few proximal small bowel loops measuring up to 3.3 cm in diameter. There is interval decrease in degree of proximal small bowel dilation. Distal small-bowel loops are decompressed. Zone of transition appears to be in left side of pelvis. Gas and stool are present in the lumen of colon. There is wall thickening in rectum which appears to be chronic. Minimal ascites is present adjacent to the proximal descending colon. There is no loculated pericolic fluid collection. Vascular/Lymphatic: Scattered arterial calcifications are seen. Reproductive: Uterus is not seen. Surgical clips are seen in pelvis. There are coarse calcifications in both sides of pelvis. Prominence of perirectal soft tissues has not changed. These findings may be residual from previous intervention such as surgery and radiation. Other: There is no pneumoperitoneum. Minimal amount of ascites is noted in left mid abdomen adjacent  to the descending colon. Musculoskeletal: No acute findings are seen. IMPRESSION: There is interval decrease in degree of proximal small bowel dilation. Still, there is moderate dilation of proximal small bowel loops with decompression of distal small bowel loops suggesting partial small bowel obstruction due to adhesions. There is no hydronephrosis. There is atrophy and cortical thinning in the left kidney with no interval change. Nodules are seen in both adrenals with no interval change. Intrahepatic bile ducts appear more prominent. This may be related to previous cholecystectomy. Less likely possibility would be stricture in the distal common bile duct. Chronic changes in the pelvis may be due to previous surgery and possibly radiation treatment. Small hiatal hernia. Aortic atherosclerosis. Electronically Signed   By: Ernie Avena M.D.   On: 11/20/2022 16:43    Procedures Ultrasound ED Peripheral IV (Provider)  Date/Time: 11/20/2022 3:05 PM  Performed by: Mannie Stabile, PA-C Authorized by:  Mannie Stabile, PA-C   Procedure details:    Indications: hydration     Skin Prep: chlorhexidine gluconate     Location: left basilic.   Angiocath:  20 G   Bedside Ultrasound Guided: Yes     Images: not archived     Patient tolerated procedure without complications: Yes     Dressing applied: Yes   Comments:     Successful IV placed     Medications Ordered in ED Medications  diatrizoate meglumine-sodium (GASTROGRAFIN) 66-10 % solution 90 mL (has no administration in time range)  lactated ringers bolus 1,000 mL (has no administration in time range)  lactated ringers infusion (has no administration in time range)  lactated ringers bolus 1,000 mL (has no administration in time range)  methocarbamol (ROBAXIN) 1,000 mg in dextrose 5 % 100 mL IVPB (has no administration in time range)  ondansetron (ZOFRAN) injection 4 mg (has no administration in time range)    Or  ondansetron (ZOFRAN) 8 mg in sodium chloride 0.9 % 50 mL IVPB (has no administration in time range)  prochlorperazine (COMPAZINE) injection 5-10 mg (has no administration in time range)  bisacodyl (DULCOLAX) suppository 10 mg (has no administration in time range)  lip balm (CARMEX) ointment (has no administration in time range)  phenol (CHLORASEPTIC) mouth spray 2 spray (has no administration in time range)  menthol-cetylpyridinium (CEPACOL) lozenge 3 mg (has no administration in time range)  magic mouthwash (has no administration in time range)  alum & mag hydroxide-simeth (MAALOX/MYLANTA) 200-200-20 MG/5ML suspension 30 mL (has no administration in time range)  simethicone (MYLICON) 40 MG/0.6ML suspension 80 mg (has no administration in time range)  diphenhydrAMINE (BENADRYL) injection 12.5-25 mg (has no administration in time range)  cefTRIAXone (ROCEPHIN) 1 g in sodium chloride 0.9 % 100 mL IVPB (has no administration in time range)  sodium chloride 0.9 % bolus 1,000 mL (0 mLs Intravenous Stopped 11/20/22 1542)   ondansetron (ZOFRAN) injection 4 mg (4 mg Intravenous Given 11/20/22 1507)  morphine (PF) 4 MG/ML injection 4 mg (4 mg Intravenous Given 11/20/22 1508)  iohexol (OMNIPAQUE) 300 MG/ML solution 80 mL (80 mLs Intravenous Contrast Given 11/20/22 1558)    ED Course/ Medical Decision Making/ A&P Clinical Course as of 11/20/22 1726  Wed Nov 20, 2022  1631 Creatinine(!): 1.32 [CA]    Clinical Course User Index [CA] Mannie Stabile, PA-C                             Medical Decision Making Amount and/or  Complexity of Data Reviewed Independent Historian: spouse External Data Reviewed: notes.    Details: Recent admission notes Labs: ordered. Decision-making details documented in ED Course. Radiology: ordered and independent interpretation performed. Decision-making details documented in ED Course. ECG/medicine tests: ordered and independent interpretation performed. Decision-making details documented in ED Course.  Risk Prescription drug management. Decision regarding hospitalization.   This patient presents to the ED for concern of LLQ abdominal pain, this involves an extensive number of treatment options, and is a complaint that carries with it a high risk of complications and morbidity.  The differential diagnosis includes bowel obstruction, diverticulitis, perforation, etc  67 year old female presents to the ED due to left lower quadrant abdominal pain associated with roughly 12 episodes of brown emesis that started earlier today.  Recent partial small bowel obstruction and discharged yesterday.  Numerous abdominal operations in the past.  No fever or chills.  Denies chest pain and shortness of breath.  Upon arrival, vitals all within normal limits.  Patient in no acute distress.  Abdomen soft, nondistended with left lower quadrant tenderness.  No rebound or guarding.  Routine labs ordered.  CT abdomen to rule out evidence of bowel obstruction.  IV fluids, Zofran, and morphine given.  CBC  reassuring.  No leukocytosis.  Mild anemia with hemoglobin 11.  UA significant for large leukocytes and few bacteria. Patient does admit to some dysuria, will treat for UTI with Rocephin. CMP significant for elevated creatinine 1.32 and hyperglycemia 157.  No anion gap.  Low suspicion for DKA.  CT abdomen personally reviewed and interpreted which demonstrates a possible partial small bowel obstruction.  Also demonstrates intrahepatic bile ducts likely related to previous cholecystectomy and chronic changes in pelvis.  5:05 PM Discussed with dr. Michaell Cowing with general surgery who recommends placing NG tube and bowel rest protocol. Surgery will see.  5:25 PM Discussed with Dr. Pola Corn with [facility] who agrees to admit patient.  Has PCP Lives with spouse at home        Final Clinical Impression(s) / ED Diagnoses Final diagnoses:  Partial small bowel obstruction [facility])    Rx / DC Orders ED Discharge Orders     None         [name] 11/20/22 1726    Glendora Score, MD 11/20/22 2019

## 2022-11-20 NOTE — H&P (Signed)
[facility] History and Physical  [name] ZOX:[id number] DOB: 1955-12-01 DOA: 11/20/2022 PCP: [name], DO  Admitted from: Home Chief Complaint: Abdominal pain  History of Present Illness: [name] is a 67 y.o. female with PMH significant for multiple prior complicated abdominal surgeries including hemicolectomy, hysterectomy, cholecystectomy, exploratory laparoscopy as well as prior pelvic radiation was recently hospitalized 6/2 to 6/4 for small bowel obstruction.  Seen by general surgery, improved with conservative management and was discharged home to follow-up as an outpatient. Within 24 hours of discharge, patient presented with recurrence of abdominal pain, nausea, vomiting.  In the ED, patient was afebrile, hemodynamically stable, breathing on room air Labs with normal, hemoglobin low at 11 but stable, renal function slightly worse with BUN/creatinine 15/1.32 Urinalysis with hazy yellow urine with large amount of leukocytes, few bacteria.  CT abdomen showed small bowel obstruction with dilatation and transition point most likely due to adhesions without any cancer recurrence. Seen by general surgery in the ED Conservative management recommended Hospital service consulted for inpatient management  At the time of my evaluation, patient was propped up in bed.  NG decompression ongoing.  Patient was visibly frustrated with the prospect of yet another surgery.  Has had 5 abdominal surgeries in the past.    Review of Systems:  All systems were reviewed and were negative unless otherwise mentioned in the HPI   Past medical history: Past Medical History:  Diagnosis Date   Atrophic kidney    Cancer (HCC)    cervical 2003   Colovaginal fistula    Pyelonephritis    Thyroid disease    Vertigo     Past surgical history: Past Surgical History:  Procedure Laterality Date   ABDOMINAL ADHESION SURGERY  11/08/2020   Dr [name] - [facility]   ABDOMINAL  HYSTERECTOMY  2003   [facility] - Dr [name] - for cervical cancer   BILE DUCT EXPLORATION  10/22/2003   CHOLECYSTECTOMY OPEN  10/22/2003   Dr Zachery Dakins - CBD repair w T tube   CYSTOSCOPY  2021   ILEOSTOMY  11/23/2018   ILEOSTOMY CLOSURE  04/2020   LOW ANTERIOR BOWEL RESECTION  11/23/2018   LAR/takedown colovaginal fistula/loop ileostomy  - [facility]    Social History:  reports that she has quit smoking. Her smoking use included cigarettes. She smoked an average of .15 packs per day. She has never used smokeless tobacco. She reports that she does not currently use alcohol. She reports that she does not use drugs.  Allergies:  Allergies  Allergen Reactions   Barium-Containing Compounds     hives   Percocet [Oxycodone-Acetaminophen] Other (See Comments)    migraine    Barium-containing compounds and Percocet [oxycodone-acetaminophen]   Family history:  Family History  Problem Relation Age of Onset   Heart disease Father    Diabetes Father    Parkinson's disease Neg Hx      Home Meds: Prior to Admission medications   Medication Sig Start Date End Date Taking? Authorizing Provider  cetirizine (ZYRTEC ALLERGY) 10 MG tablet Take 1 tablet (10 mg total) by mouth daily. 08/30/22   Fondaw, Rodrigo Ran, PA  fluticasone (FLONASE) 50 MCG/ACT nasal spray Place 2 sprays into both nostrils daily for 14 days. 08/30/22 11/17/22  Gailen Shelter, PA  levothyroxine (SYNTHROID) 25 MCG tablet Take 1 tablet (25 mcg total) by mouth daily. 07/17/22   [name], DO  simvastatin (ZOCOR) 20 MG tablet Take 1 tablet (20 mg total) by mouth  daily at 6 PM. 09/23/22   [name], DO    Physical Exam: Vitals:   11/20/22 1800 11/20/22 1859 11/20/22 1933 11/20/22 2027  BP:  119/61  118/73  Pulse:  63  75  Resp:  16  18  Temp: 97.8 F (36.6 C) 98.1 F (36.7 C)  98.8 F (37.1 C)  TempSrc:  Oral  Oral  SpO2:  98% 98% 100%   Wt Readings from Last 3 Encounters:  11/17/22 56.7 kg  10/19/22 58.5 kg   10/16/22 58.8 kg   There is no height or weight on file to calculate BMI.  General exam: Pleasant elderly Caucasian female. Skin: No rashes, lesions or ulcers. HEENT: Atraumatic, normocephalic, no obvious bleeding.  NG tube decompression ongoing Lungs: Clear to auscultation bilaterally CVS: Regular rate and rhythm, no murmur GI/Abd soft, mild generalized nontender, distended, bowel sound present CNS: Alert, awake, oriented x 3 Psychiatry: Sad affect Extremities: No pedal edema, no calf tenderness   ------------------------------------------------------------------------------------------------------ Assessment/Plan: Principal Problem:   SBO (small bowel obstruction) (HCC) Active Problems:   CKD (chronic kidney disease) stage 3, GFR 30-59 ml/min (HCC)  Recurrent SBO H/o multiple prior abdominal surgeries, radiation Presented with abdominal pain, nausea vomiting in the setting of recent SBO CT abdomen with SBO with dilation and transition point likely due to adhesions without any cancer recurrence Seen by general surgery.  No urgent need of surgery. Started on conservative management with NG tube insertion, IV fluid, bowel rest, IV antiemetics, IV analgesics Monitor electrolytes Recent Labs  Lab 11/17/22 1000 11/18/22 0433 11/19/22 0451 11/20/22 1430  K 4.2 3.7 3.3* 4.0  MG  --   --  1.8  --    CKD 3B Creatinine seems to be at baseline although slightly worse compared to yesterday. Monitor on IV hydration Recent Labs    07/19/22 0852 09/11/22 0406 11/17/22 1000 11/18/22 0433 11/19/22 0451 11/20/22 1430  BUN 14 31* 37* 32* 20 15  CREATININE 1.05* 1.58* 1.48* 1.23* 1.08* 1.32*   Abnormal urinalysis Denies any urinary symptoms. Urinalysis with hazy yellow urine with large amount of leukocytes, few bacteria. CT abdomen did not show any evidence of hydronephrosis Patient was given 1 dose of IV Rocephin in the ED. monitor off antibiotics.  Hypothyroidism On  Synthroid 25 mcg daily.  Can be on hold for now for few days Recent Labs    07/19/22 0852  TSH 1.710    HLD Simvastatin can be on hold  Mobility: Encourage ambulation  Goals of care   Code Status: Full Code    DVT prophylaxis:  enoxaparin (LOVENOX) injection 40 mg Start: 11/20/22 1830   Antimicrobials: None Fluid: D5 NS at 100 mill per hour Consultants: General surgery Family Communication: None at bedside  Dispo: The patient is from: Home              Anticipated d/c is to: Home likely, pending clinical course  Diet: Diet Order             Diet NPO time specified Except for: Ice Chips  Diet effective now                    ------------------------------------------------------------------------------------- Severity of Illness: The appropriate patient status for this patient is INPATIENT. Inpatient status is judged to be reasonable and necessary in order to provide the required intensity of service to ensure the patient's safety. The patient's presenting symptoms, physical exam findings, and initial radiographic and laboratory data in the context of  their chronic comorbidities is felt to place them at high risk for further clinical deterioration. Furthermore, it is not anticipated that the patient will be medically stable for discharge from the hospital within 2 midnights of admission.   * I certify that at the point of admission it is my clinical judgment that the patient will require inpatient hospital care spanning beyond 2 midnights from the point of admission due to high intensity of service, high risk for further deterioration and high frequency of surveillance required.* -------------------------------------------------------------------------------------   Labs on Admission:   CBC: Recent Labs  Lab 11/17/22 1000 11/18/22 0433 11/19/22 0451 11/20/22 1630  WBC 6.2 5.1 3.6* 5.4  NEUTROABS 4.9  --  2.0 4.7  HGB 14.4 11.7* 11.1* 11.0*  HCT 41.0 34.5*  33.1* 32.5*  MCV 91.5 96.4 94.0 95.3  PLT 169 133* 117* 126*    Basic Metabolic Panel: Recent Labs  Lab 11/17/22 1000 11/18/22 0433 11/19/22 0451 11/20/22 1430  NA 137 137 141 142  K 4.2 3.7 3.3* 4.0  CL 98 108 109 107  CO2 25 20* 26 24  GLUCOSE 133* 72 122* 157*  BUN 37* 32* 20 15  CREATININE 1.48* 1.23* 1.08* 1.32*  CALCIUM 9.6 8.3* 8.4* 9.2  MG  --   --  1.8  --     Liver Function Tests: Recent Labs  Lab 11/17/22 1000 11/18/22 0433 11/19/22 0451 11/20/22 1430  AST 15 11* 13* 15  ALT 13 10 11 14   ALKPHOS 79 53 52 62  BILITOT 1.3* 0.9 0.2* 0.3  PROT 7.7 5.6* 5.6* 6.6  ALBUMIN 4.4 3.4* 3.1* 3.9   Recent Labs  Lab 11/17/22 1000 11/20/22 1430  LIPASE 22 23   No results for input(s): "AMMONIA" in the last 168 hours.  Cardiac Enzymes: No results for input(s): "CKTOTAL", "CKMB", "CKMBINDEX", "TROPONINI" in the last 168 hours.  BNP (last 3 results) No results for input(s): "BNP" in the last 8760 hours.  ProBNP (last 3 results) No results for input(s): "PROBNP" in the last 8760 hours.  CBG: No results for input(s): "GLUCAP" in the last 168 hours.  Lipase     Component Value Date/Time   LIPASE 23 11/20/2022 1430     Urinalysis    Component Value Date/Time   COLORURINE YELLOW 11/20/2022 1554   APPEARANCEUR HAZY (A) 11/20/2022 1554   APPEARANCEUR Clear 10/16/2022 0813   LABSPEC 1.024 11/20/2022 1554   PHURINE 5.0 11/20/2022 1554   GLUCOSEU NEGATIVE 11/20/2022 1554   HGBUR NEGATIVE 11/20/2022 1554   BILIRUBINUR NEGATIVE 11/20/2022 1554   BILIRUBINUR Negative 10/16/2022 0813   KETONESUR NEGATIVE 11/20/2022 1554   PROTEINUR 30 (A) 11/20/2022 1554   UROBILINOGEN 0.2 10/11/2022 0848   UROBILINOGEN 0.2 03/04/2014 1118   NITRITE NEGATIVE 11/20/2022 1554   LEUKOCYTESUR LARGE (A) 11/20/2022 1554     Drugs of Abuse  No results found for: "LABOPIA", "COCAINSCRNUR", "LABBENZ", "AMPHETMU", "THCU", "LABBARB"    Radiological Exams on Admission: CT ABDOMEN  PELVIS W CONTRAST  Result Date: 11/20/2022 CLINICAL DATA:  Bowel obstruction suspected EXAM: CT ABDOMEN AND PELVIS WITH CONTRAST TECHNIQUE: Multidetector CT imaging of the abdomen and pelvis was performed using the standard protocol following bolus administration of intravenous contrast. RADIATION DOSE REDUCTION: This exam was performed according to the departmental dose-optimization program which includes automated exposure control, adjustment of the mA and/or kV according to patient size and/or use of iterative reconstruction technique. CONTRAST:  80mL OMNIPAQUE IOHEXOL 300 MG/ML  SOLN COMPARISON:  Previous studies including the  examination of 11/17/2022 FINDINGS: Lower chest: Visualized lower lung fields are clear. Hepatobiliary: Surgical clips are seen in gallbladder fossa. Intrahepatic bile ducts appear more prominent in comparison with the previous study. Distal common bile duct in the head of the pancreas measures 8 mm. Pancreas: There is atrophy.  No focal abnormalities are seen. Spleen: Unremarkable. Adrenals/Urinary Tract: There is 2.1 cm nodule in left adrenal which appears stable, possibly adenoma. There is a 9 mm nodule in the right adrenal which appears stable. Left kidneys much smaller than right. There are foci of parenchymal thinning at multiple sites in the left kidney suggesting scarring. There is no significant hydronephrosis. There is prominence of left renal pelvis without significant dilation of minor calyces. There are no renal or ureteral stones. Urinary bladder is not distended. Stomach/Bowel: Small hiatal hernia is seen. Stomach is not distended. There is dilation of few proximal small bowel loops measuring up to 3.3 cm in diameter. There is interval decrease in degree of proximal small bowel dilation. Distal small-bowel loops are decompressed. Zone of transition appears to be in left side of pelvis. Gas and stool are present in the lumen of colon. There is wall thickening in rectum which  appears to be chronic. Minimal ascites is present adjacent to the proximal descending colon. There is no loculated pericolic fluid collection. Vascular/Lymphatic: Scattered arterial calcifications are seen. Reproductive: Uterus is not seen. Surgical clips are seen in pelvis. There are coarse calcifications in both sides of pelvis. Prominence of perirectal soft tissues has not changed. These findings may be residual from previous intervention such as surgery and radiation. Other: There is no pneumoperitoneum. Minimal amount of ascites is noted in left mid abdomen adjacent to the descending colon. Musculoskeletal: No acute findings are seen. IMPRESSION: There is interval decrease in degree of proximal small bowel dilation. Still, there is moderate dilation of proximal small bowel loops with decompression of distal small bowel loops suggesting partial small bowel obstruction due to adhesions. There is no hydronephrosis. There is atrophy and cortical thinning in the left kidney with no interval change. Nodules are seen in both adrenals with no interval change. Intrahepatic bile ducts appear more prominent. This may be related to previous cholecystectomy. Less likely possibility would be stricture in the distal common bile duct. Chronic changes in the pelvis may be due to previous surgery and possibly radiation treatment. Small hiatal hernia. Aortic atherosclerosis. Electronically Signed   By: Ernie Avena M.D.   On: 11/20/2022 16:43     Signed, [name], MD [facility] 11/20/2022

## 2022-11-20 NOTE — Consult Note (Addendum)
[name]  [birth year] [id number]  CARE TEAM:  PCP: [name], DO  Outpatient Care Team: Patient Care Team: [name], DO as PCP - General (Family Medicine) [name], RN as [facility] Care Management [name], MD as Referring Physician (General Surgery)  Inpatient Treatment Team: Treatment Team: Attending Provider: [name], MD; Technician: [name], NT; Registered Nurse: [name], RN; Physician Assistant: [name]; Consulting Physician: [name], MD; Rounding Team: Arlean Hopping, MD   This patient is a 67 y.o.female who presents today for surgical evaluation at the request of [name], [place], [facility] ED.   Chief complaint / Reason for evaluation: recurrent SBO  C60-year-old woman with numerous medical issues.  History of difficult cholecystectomy with choledocholithiasis requiring lap.  Open cholecystectomy antibiotic expiration repair 2005.  Developed cervical cancer during radiation and ultimately hysterectomy with partial colectomy by Dr. [name] 2003.  Patient developed colovaginal fistula in 2020.  Had surgery by Dr. [name] through [facility].  Redo low anterior resection with diverting loop ileostomy in June 2020.  Had persistent leak requiring delayed ileostomy takedown until 2021.  Developed bowel obstruction requiring open abdominal exploration and lysis adhesions 2022 by Dr. [name], Dr. [name] partner.  Both at [facility].  Patient had abdominal pain and discomfort and went to [facility].  CT scan was down so sent to [facility] where CAT scans concerning for bowel obstruction.  Recommendation for admission back to [facility] but patient staying at [facility].  Our group consulted.  Patient had bowel movements and was able to [place] on diet rather quickly and sent home yesterday.  Apparently patient having recurrent abdominal pain with  nausea and vomiting.  CAT scan again shows bowel obstruction with dilatation and transition point most likely due to adhesions without any cancer recurrence.    Assessment  [name]  67 y.o. female       Problem List:  Principal Problem:   SBO (small bowel obstruction) (HCC) Active Problems:   CKD (chronic kidney disease) stage 3, GFR 30-59 ml/min (HCC)   Recurrent bowel obstruction in patient with prior pelvic radiation and numerous abdominal surgeries with already surgery for bowel obstruction 2 years ago.  Plan:  Admission  IV fluid resuscitation.  No nausea and pain control.  Nasogastric tube decompression at this point  Small bowel protocol.  Try and hold of operative exploration.  Need to figure out if the patient is deciding to change care from [facility] to [facility].  There is no urgent need for operative intervention at this time but surgery can help follow.  Hopefully she will get better nonoperatively but given her numerous abdominal surgeries and prior radiation and prior need for bowel obstruction surgery, she may not fly.  We will see.    I reviewed nursing notes, ED provider notes, last 24 h vitals and pain scores, last 48 h intake and output, last 24 h labs and trends, last 24 h imaging results, and prior surgeries by colorectal surgery at which [facility] and prior visits.  2020-2022. . I have reviewed this patient's available data, including medical history, events of note, test results, etc as part of my evaluation.  A significant portion of that time was spent in counseling.  Care during the described time interval was provided by me.  This care required moderate level of medical decision making.  11/20/2022  [name], MD, FACS, MASCRS Esophageal, Gastrointestinal & Colorectal Surgery Robotic and Minimally Invasive Surgery  [facility] [address] [place], [place] [address] [phone] Fax [phone] Main  CONTACT INFORMATION:  Weekday (9AM-5PM): Call [facility] main office at [phone]  Weeknight (5PM-9AM) or Weekend/Holiday: Check [email or web address] (password " [email or web address]") for General Surgery [facility] coverage  (Please, do not use SecureChat as it is not reliable communication to reach operating surgeons for immediate patient care given surgeries/outpatient duties/clinic/cross-coverage/off post-call which would lead to a delay in care.  Epic staff messaging available for outptient concerns, but may not be answered for 48 hours or more).     11/20/2022      Past Medical History:  Diagnosis Date   Atrophic kidney    Cancer (HCC)    cervical 2003   Colovaginal fistula    Pyelonephritis    Thyroid disease    Vertigo     Past Surgical History:  Procedure Laterality Date   ABDOMINAL ADHESION SURGERY  11/08/2020   Dr [name] - [facility]   ABDOMINAL HYSTERECTOMY  2003   [facility] - Dr [name] - for cervical cancer   BILE DUCT EXPLORATION  10/22/2003   CHOLECYSTECTOMY OPEN  10/22/2003   Dr Zachery Dakins - CBD repair w T tube   CYSTOSCOPY  2021   ILEOSTOMY  11/23/2018   ILEOSTOMY CLOSURE  04/2020   LOW ANTERIOR BOWEL RESECTION  11/23/2018   LAR/takedown colovaginal fistula/loop ileostomy  - [facility]    Social History   Socioeconomic History   Marital status: Married    Spouse name: Not on file   Number of children: 1   Years of education: Not on file   Highest education level: Not on file  Occupational History   Occupation: retired    Comment: Conservation officer, 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  Tobacco Use   Smoking status: Former    Packs/day: .15    Types: Cigarettes   Smokeless tobacco: Never  Vaping Use   Vaping Use: Never used  Substance and Sexual Activity   Alcohol use: Not Currently   Drug use: No   Sexual activity: Not Currently  Other Topics Concern   Not on file  Social History Narrative   Patient is a  retired Conservation officer, 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   Patient is married and has 1 daughter and 1 granddaughter who reside about 4 miles from her   Social Determinants of Health   Financial Resource Strain: Low Risk  (11/06/2022)   Overall Financial Resource Strain (CARDIA)    Difficulty of Paying Living Expenses: Not hard at all  Food Insecurity: No Food Insecurity (11/17/2022)   Hunger Vital Sign    Worried About Running Out of Food in the Last Year: Never true    Ran Out of Food in the Last Year: Never true  Transportation Needs: No Transportation Needs (11/17/2022)   PRAPARE - Administrator, Civil Service (Medical): No    Lack of Transportation (Non-Medical): No  Physical Activity: Not on file  Stress: No Stress Concern Present (11/06/2022)   [employer] of Occupational Health - Occupational Stress Questionnaire    Feeling of Stress : Only a little  Social Connections: Not on file  Intimate Partner Violence: Not At Risk (11/17/2022)   Humiliation, Afraid, Rape, and Kick questionnaire    Fear of Current or Ex-Partner: No    Emotionally Abused: No    Physically  Abused: No    Sexually Abused: No    Family History  Problem Relation Age of Onset   Heart disease Father    Diabetes Father    Parkinson's disease Neg Hx     Current Facility-Administered Medications  Medication Dose Route Frequency Provider Last Rate Last Admin   acetaminophen (TYLENOL) tablet 650 mg  650 mg Oral Q6H PRN Dahal, Melina Schools, MD       Or   acetaminophen (TYLENOL) suppository 650 mg  650 mg Rectal Q6H PRN Dahal, Binaya, MD       albuterol (PROVENTIL) (2.5 MG/3ML) 0.083% nebulizer solution 2.5 mg  2.5 mg Nebulization Q6H Dahal, Binaya, MD       alum & mag hydroxide-simeth (MAALOX/MYLANTA) 200-200-20 MG/5ML suspension 30 mL  30 mL Oral Q6H PRN Karie Soda, MD       bisacodyl (DULCOLAX) suppository 10 mg  10 mg Rectal Q12H PRN Karie Soda, MD       cefTRIAXone (ROCEPHIN) 1 g in sodium  chloride 0.9 % 100 mL IVPB  1 g Intravenous Once Aberman, Caroline C, PA-C       dextrose 5 %-0.9 % sodium chloride infusion   Intravenous Continuous Dahal, Binaya, MD       diatrizoate meglumine-sodium (GASTROGRAFIN) 66-10 % solution 90 mL  90 mL Per NG tube Once Karie Soda, MD       diphenhydrAMINE (BENADRYL) injection 12.5-25 mg  12.5-25 mg Intravenous Q6H PRN Karie Soda, MD       enoxaparin (LOVENOX) injection 40 mg  40 mg Subcutaneous Q24H Dahal, Binaya, MD       hydrALAZINE (APRESOLINE) injection 10 mg  10 mg Intravenous Q6H PRN Dahal, Binaya, MD       lactated ringers bolus 1,000 mL  1,000 mL Intravenous Once Karie Soda, MD       lactated ringers bolus 1,000 mL  1,000 mL Intravenous Q8H PRN Zalaya Astarita, Viviann Spare, MD       lactated ringers infusion   Intravenous Continuous Tanor Glaspy, Viviann Spare, MD       lip balm (CARMEX) ointment   Topical BID Karie Soda, MD       magic mouthwash  15 mL Oral QID PRN Karie Soda, MD       menthol-cetylpyridinium (CEPACOL) lozenge 3 mg  1 lozenge Oral PRN Karie Soda, MD       methocarbamol (ROBAXIN) 1,000 mg in dextrose 5 % 100 mL IVPB  1,000 mg Intravenous Q6H PRN Karie Soda, MD       ondansetron (ZOFRAN) injection 4 mg  4 mg Intravenous Q6H PRN Karie Soda, MD       Or   ondansetron (ZOFRAN) 8 mg in sodium chloride 0.9 % 50 mL IVPB  8 mg Intravenous Q6H PRN Karie Soda, MD       phenol (CHLORASEPTIC) mouth spray 2 spray  2 spray Mouth/Throat PRN Karie Soda, MD       prochlorperazine (COMPAZINE) injection 5-10 mg  5-10 mg Intravenous Q4H PRN Karie Soda, MD       simethicone (MYLICON) 40 MG/0.6ML suspension 80 mg  80 mg Oral QID PRN Karie Soda, MD       Current Outpatient Medications  Medication Sig Dispense Refill   cetirizine (ZYRTEC ALLERGY) 10 MG tablet Take 1 tablet (10 mg total) by mouth daily. 30 tablet 0   fluticasone (FLONASE) 50 MCG/ACT nasal spray Place 2 sprays into both nostrils daily for 14 days. 11.1 mL 0   levothyroxine  (SYNTHROID) 25 MCG tablet  Take 1 tablet (25 mcg total) by mouth daily. 90 tablet 3   simvastatin (ZOCOR) 20 MG tablet Take 1 tablet (20 mg total) by mouth daily at 6 PM. 90 tablet 0     Allergies  Allergen Reactions   Barium-Containing Compounds     hives   Percocet [Oxycodone-Acetaminophen] Other (See Comments)    migraine      BP 105/76   Pulse 74   Temp 98 F (36.7 C) (Oral)   Resp (!) 21   SpO2 100%     Results:   Labs: Results for orders placed or performed during the hospital encounter of 11/20/22 (from the past 48 hour(s))  Comprehensive metabolic panel     Status: Abnormal   Collection Time: 11/20/22  2:30 PM  Result Value Ref Range   Sodium 142 135 - 145 mmol/L   Potassium 4.0 3.5 - 5.1 mmol/L   Chloride 107 98 - 111 mmol/L   CO2 24 22 - 32 mmol/L   Glucose, Bld 157 (H) 70 - 99 mg/dL    Comment: Glucose reference range applies only to samples taken after fasting for at least 8 hours.   BUN 15 8 - 23 mg/dL   Creatinine, Ser 4.13 (H) 0.44 - 1.00 mg/dL   Calcium 9.2 8.9 - 24.4 mg/dL   Total Protein 6.6 6.5 - 8.1 g/dL   Albumin 3.9 3.5 - 5.0 g/dL   AST 15 15 - 41 U/L   ALT 14 0 - 44 U/L   Alkaline Phosphatase 62 38 - 126 U/L   Total Bilirubin 0.3 0.3 - 1.2 mg/dL   GFR, Estimated 45 (L) >60 mL/min    Comment: (NOTE) Calculated using the CKD-EPI Creatinine Equation (2021)    Anion gap 11 5 - 15    Comment: Performed at [facility], [address], [place], [place] [address]  Lipase, blood     Status: None   Collection Time: 11/20/22  2:30 PM  Result Value Ref Range   Lipase 23 11 - 51 U/L    Comment: Performed at [facility], [address], [place], [place] [address]  Urinalysis, Routine w reflex microscopic -Urine, Clean Catch     Status: Abnormal   Collection Time: 11/20/22  3:54 PM  Result Value Ref Range   Color, Urine YELLOW YELLOW   APPearance HAZY (A) CLEAR   Specific Gravity, Urine 1.024 1.005 - 1.030    pH 5.0 5.0 - 8.0   Glucose, UA NEGATIVE NEGATIVE mg/dL   Hgb urine dipstick NEGATIVE NEGATIVE   Bilirubin Urine NEGATIVE NEGATIVE   Ketones, ur NEGATIVE NEGATIVE mg/dL   Protein, ur 30 (A) NEGATIVE mg/dL   Nitrite NEGATIVE NEGATIVE   Leukocytes,Ua LARGE (A) NEGATIVE   RBC / HPF 11-20 0 - 5 RBC/hpf   WBC, UA >50 0 - 5 WBC/hpf   Bacteria, UA FEW (A) NONE SEEN   Squamous Epithelial / HPF 0-5 0 - 5 /HPF   Mucus PRESENT    Non Squamous Epithelial 0-5 (A) NONE SEEN    Comment: Performed at [facility], [address], [place], [place] [address]  CBC with Differential/Platelet     Status: Abnormal   Collection Time: 11/20/22  4:30 PM  Result Value Ref Range   WBC 5.4 4.0 - 10.5 K/uL   RBC 3.41 (L) 3.87 - 5.11 MIL/uL   Hemoglobin 11.0 (L) 12.0 - 15.0 g/dL   HCT 74.2 (L) 59.5 - 63.8 %  MCV 95.3 80.0 - 100.0 fL   MCH 32.3 26.0 - 34.0 pg   MCHC 33.8 30.0 - 36.0 g/dL   RDW 11.9 14.7 - 82.9 %   Platelets 126 (L) 150 - 400 K/uL   nRBC 0.0 0.0 - 0.2 %   Neutrophils Relative % 89 %   Neutro Abs 4.7 1.7 - 7.7 K/uL   Lymphocytes Relative 8 %   Lymphs Abs 0.4 (L) 0.7 - 4.0 K/uL   Monocytes Relative 3 %   Monocytes Absolute 0.2 0.1 - 1.0 K/uL   Eosinophils Relative 0 %   Eosinophils Absolute 0.0 0.0 - 0.5 K/uL   Basophils Relative 0 %   Basophils Absolute 0.0 0.0 - 0.1 K/uL   Immature Granulocytes 0 %   Abs Immature Granulocytes 0.02 0.00 - 0.07 K/uL    Comment: Performed at [facility], [address] [place], [place] [address]    Imaging / Studies: CT ABDOMEN PELVIS W CONTRAST  Result Date: 11/20/2022 CLINICAL DATA:  Bowel obstruction suspected EXAM: CT ABDOMEN AND PELVIS WITH CONTRAST TECHNIQUE: Multidetector CT imaging of the abdomen and pelvis was performed using the standard protocol following bolus administration of intravenous contrast. RADIATION DOSE REDUCTION: This exam was performed according to the departmental dose-optimization program  which includes automated exposure control, adjustment of the mA and/or kV according to patient size and/or use of iterative reconstruction technique. CONTRAST:  80mL OMNIPAQUE IOHEXOL 300 MG/ML  SOLN COMPARISON:  Previous studies including the examination of 11/17/2022 FINDINGS: Lower chest: Visualized lower lung fields are clear. Hepatobiliary: Surgical clips are seen in gallbladder fossa. Intrahepatic bile ducts appear more prominent in comparison with the previous study. Distal common bile duct in the head of the pancreas measures 8 mm. Pancreas: There is atrophy.  No focal abnormalities are seen. Spleen: Unremarkable. Adrenals/Urinary Tract: There is 2.1 cm nodule in left adrenal which appears stable, possibly adenoma. There is a 9 mm nodule in the right adrenal which appears stable. Left kidneys much smaller than right. There are foci of parenchymal thinning at multiple sites in the left kidney suggesting scarring. There is no significant hydronephrosis. There is prominence of left renal pelvis without significant dilation of minor calyces. There are no renal or ureteral stones. Urinary bladder is not distended. Stomach/Bowel: Small hiatal hernia is seen. Stomach is not distended. There is dilation of few proximal small bowel loops measuring up to 3.3 cm in diameter. There is interval decrease in degree of proximal small bowel dilation. Distal small-bowel loops are decompressed. Zone of transition appears to be in left side of pelvis. Gas and stool are present in the lumen of colon. There is wall thickening in rectum which appears to be chronic. Minimal ascites is present adjacent to the proximal descending colon. There is no loculated pericolic fluid collection. Vascular/Lymphatic: Scattered arterial calcifications are seen. Reproductive: Uterus is not seen. Surgical clips are seen in pelvis. There are coarse calcifications in both sides of pelvis. Prominence of perirectal soft tissues has not changed. These  findings may be residual from previous intervention such as surgery and radiation. Other: There is no pneumoperitoneum. Minimal amount of ascites is noted in left mid abdomen adjacent to the descending colon. Musculoskeletal: No acute findings are seen. IMPRESSION: There is interval decrease in degree of proximal small bowel dilation. Still, there is moderate dilation of proximal small bowel loops with decompression of distal small bowel loops suggesting partial small bowel obstruction due to adhesions. There is no hydronephrosis. There is  atrophy and cortical thinning in the left kidney with no interval change. Nodules are seen in both adrenals with no interval change. Intrahepatic bile ducts appear more prominent. This may be related to previous cholecystectomy. Less likely possibility would be stricture in the distal common bile duct. Chronic changes in the pelvis may be due to previous surgery and possibly radiation treatment. Small hiatal hernia. Aortic atherosclerosis. Electronically Signed   By: Ernie Avena M.D.   On: 11/20/2022 16:43   DG Abd Portable 1V  Result Date: 11/18/2022 CLINICAL DATA:  Small-bowel obstruction EXAM: PORTABLE ABDOMEN - 1 VIEW COMPARISON:  Previous studies including the CT done on 12-11-22 FINDINGS: There is dilation of small-bowel loops in left upper abdomen measuring up to 4 cm in diameter. Bowel gas pattern is otherwise unremarkable. Stomach is not distended. There is contrast in the urinary bladder from previous CT. Visualized lower lung fields are clear. Surgical clips are seen in right upper quadrant. IMPRESSION: There is dilation of proximal small bowel loops suggesting partial small bowel obstruction. Electronically Signed   By: Ernie Avena M.D.   On: 11/18/2022 09:38   CT ABDOMEN PELVIS W CONTRAST  Result Date: 11-Dec-2022 CLINICAL DATA:  Abdominal pain.  Nausea vomiting. EXAM: CT ABDOMEN AND PELVIS WITH CONTRAST TECHNIQUE: Multidetector CT imaging of  the abdomen and pelvis was performed using the standard protocol following bolus administration of intravenous contrast. RADIATION DOSE REDUCTION: This exam was performed according to the departmental dose-optimization program which includes automated exposure control, adjustment of the mA and/or kV according to patient size and/or use of iterative reconstruction technique. CONTRAST:  OMNIPAQUE IOHEXOL 300 MG/ML  SOLN COMPARISON:  09/11/2022. FINDINGS: Lower chest: Clear lung bases. Hepatobiliary: No focal liver abnormality is seen. Status post cholecystectomy. No biliary dilatation. Pancreas: Atrophy.  No mass or inflammation. Spleen: Normal in size without focal abnormality. Adrenals/Urinary Tract: Stable 2.2 cm left adrenal mass, measuring -3 Hounsfield units on the previous unenhanced CT, consistent with an adenoma. No follow-up recommended. Normal right adrenal gland. Marked left renal atrophy. Left hydronephrosis noted on the prior CT is no longer visualized. Normal right kidney. Ureters normal in course and in caliber. Bladder unremarkable. Stomach/Bowel: Dilated proximal small bowel to a maximum of 3.8 cm. There is a transition point in the left pelvis, with decompressed small bowel distal to this. A small bowel anastomosis staple line is noted along the distal ileum in the right pelvis. Stomach mildly distended with fluid. No wall thickening or inflammation. No small bowel wall thickening. Colon is normal in caliber. No colonic wall thickening or convincing inflammation. Vascular/Lymphatic: Aortic atherosclerosis. No aneurysm. No enlarged lymph nodes. Reproductive: Status post hysterectomy. No adnexal masses. Extensive pelvic scarring with inferior pelvic sidewall calcifications and surgical vascular clips related to treatment of cervical carcinoma, stable from the prior exam. Other: No hernia or ascites. Musculoskeletal: No fracture or acute finding. No osteoblastic or osteolytic lesions. IMPRESSION:  1. Partial small bowel obstruction, transition point in the left pelvis suspected to be due to adhesions. 2. No other acute abnormality within the abdomen or pelvis. 3. Marked left renal atrophy, stable. Hydronephrosis noted on the previous CT has resolved. 4. Stable chronic pelvic changes from treatment of cervical carcinoma. 5. Aortic atherosclerosis. Electronically Signed   By: Amie Portland M.D.   On: 12/11/2022 17:27    Medications / Allergies: per chart  Antibiotics: Anti-infectives (From admission, onward)    Start     Dose/Rate Route Frequency Ordered Stop   11/20/22 1730  cefTRIAXone (ROCEPHIN) 1 g in sodium chloride 0.9 % 100 mL IVPB        1 g 200 mL/hr over 30 Minutes Intravenous  Once 11/20/22 1717           Note: Portions of this report may have been transcribed using voice recognition software. Every effort was made to ensure accuracy; however, inadvertent computerized transcription errors may be present.   Any transcriptional errors that result from this process are unintentional.    [name], MD, FACS, MASCRS Esophageal, Gastrointestinal & Colorectal Surgery Robotic and Minimally Invasive Surgery  [facility] [address], [address] [place], [place] [address] [phone] Fax [phone] Main  CONTACT INFORMATION:  Weekday (9AM-5PM): Call [facility] main office at [phone]  Weeknight (5PM-9AM) or Weekend/Holiday: Check [email or web address] (password " [email or web address]") for General Surgery [facility] coverage  (Please, do not use SecureChat as it is not reliable communication to reach operating surgeons for immediate patient care given surgeries/outpatient duties/clinic/cross-coverage/off post-call which would lead to a delay in care.  Epic staff messaging available for outptient concerns, but may not be answered for 48 hours or more).      11/20/2022  5:32 PM

## 2022-11-21 ENCOUNTER — Inpatient Hospital Stay (HOSPITAL_COMMUNITY): Payer: Medicare Other

## 2022-11-21 DIAGNOSIS — K56609 Unspecified intestinal obstruction, unspecified as to partial versus complete obstruction: Secondary | ICD-10-CM | POA: Diagnosis not present

## 2022-11-21 DIAGNOSIS — R609 Edema, unspecified: Secondary | ICD-10-CM

## 2022-11-21 LAB — BASIC METABOLIC PANEL
Anion gap: 8 (ref 5–15)
BUN: 14 mg/dL (ref 8–23)
CO2: 25 mmol/L (ref 22–32)
Calcium: 8.3 mg/dL — ABNORMAL LOW (ref 8.9–10.3)
Chloride: 108 mmol/L (ref 98–111)
Creatinine, Ser: 1.15 mg/dL — ABNORMAL HIGH (ref 0.44–1.00)
GFR, Estimated: 53 mL/min — ABNORMAL LOW (ref >60)
Glucose, Bld: 83 mg/dL (ref 70–99)
Potassium: 3.5 mmol/L (ref 3.5–5.1)
Sodium: 141 mmol/L (ref 135–145)

## 2022-11-21 LAB — URINE CULTURE

## 2022-11-21 LAB — CBC
HCT: 31.5 % — ABNORMAL LOW (ref 36.0–46.0)
Hemoglobin: 10.8 g/dL — ABNORMAL LOW (ref 12.0–15.0)
MCH: 32.5 pg (ref 26.0–34.0)
MCHC: 34.3 g/dL (ref 30.0–36.0)
MCV: 94.9 fL (ref 80.0–100.0)
Platelets: 126 10*3/uL — ABNORMAL LOW (ref 150–400)
RBC: 3.32 MIL/uL — ABNORMAL LOW (ref 3.87–5.11)
RDW: 12.9 % (ref 11.5–15.5)
WBC: 5 10*3/uL (ref 4.0–10.5)
nRBC: 0 % (ref 0.0–0.2)

## 2022-11-21 LAB — MAGNESIUM: Magnesium: 1.9 mg/dL (ref 1.7–2.4)

## 2022-11-21 LAB — PHOSPHORUS: Phosphorus: 3.2 mg/dL (ref 2.5–4.6)

## 2022-11-21 MED ORDER — KETOROLAC TROMETHAMINE 15 MG/ML IJ SOLN
15.0000 mg | Freq: Four times a day (QID) | INTRAMUSCULAR | Status: DC | PRN
  Administered 2022-11-22: 15 mg via INTRAVENOUS
  Filled 2022-11-21 (×2): qty 1

## 2022-11-21 MED ORDER — SODIUM CHLORIDE 0.9% FLUSH
3.0000 mL | Freq: Two times a day (BID) | INTRAVENOUS | Status: DC
  Administered 2022-11-22: 3 mL via INTRAVENOUS

## 2022-11-21 MED ORDER — LACTATED RINGERS IV BOLUS: 1000.0000 mL | Freq: Three times a day (TID) | INTRAVENOUS | Status: DC | PRN

## 2022-11-21 MED ORDER — BISACODYL 10 MG RE SUPP
10.0000 mg | Freq: Every day | RECTAL | Status: DC
  Administered 2022-11-21 – 2022-11-23 (×2): 10 mg via RECTAL
  Filled 2022-11-21 (×2): qty 1

## 2022-11-21 MED ORDER — SODIUM CHLORIDE 0.9 % IV SOLN: 250.0000 mL | INTRAVENOUS | Status: DC | PRN

## 2022-11-21 MED ORDER — POTASSIUM CHLORIDE 2 MEQ/ML IV SOLN
INTRAVENOUS | Status: DC
  Filled 2022-11-21 (×4): qty 1000

## 2022-11-21 MED ORDER — SODIUM CHLORIDE 0.9% FLUSH: 3.0000 mL | INTRAVENOUS | Status: DC | PRN

## 2022-11-21 MED ORDER — MORPHINE SULFATE (PF) 2 MG/ML IV SOLN: 1.0000 mg | INTRAVENOUS | Status: DC | PRN

## 2022-11-21 NOTE — Progress Notes (Signed)
Mobility Specialist - Progress Note   11/21/22 0921  Mobility  Activity Ambulated independently in hallway  Level of Assistance Independent  Assistive Device None  Distance Ambulated (ft) 600 ft  Activity Response Tolerated well  Mobility Referral Yes  $Mobility charge 1 Mobility  Mobility Specialist Start Time (ACUTE ONLY) 0914  Mobility Specialist Stop Time (ACUTE ONLY) [id number]  Mobility Specialist Time Calculation (min) (ACUTE ONLY) 7 min   Pt received in bed and agreeable to mobility. No complaints during session. Pt to bed after session with all needs met.    [facility]

## 2022-11-21 NOTE — Progress Notes (Signed)
Pt's daughter here,states that pt does not remember being here hospitalized 2 days ago. Daughter concerned that pt's mother had dementia [phone] Leanne Chang.

## 2022-11-21 NOTE — Progress Notes (Signed)
Prn med given for migraine HA this afternoon, noted cannister from NG tube drainage has now turned to chalky whitish color. Pt denies taking any home meds like Mylanta. Ice chips and carmex provided. Daughter in to visit

## 2022-11-21 NOTE — Progress Notes (Signed)
VASCULAR LAB    Left upper extremity venous duplex has been performed.  See CV proc for preliminary results.   Jalia Zuniga, RVT 11/21/2022, 9:53 AM

## 2022-11-21 NOTE — Progress Notes (Signed)
11/21/2022  [name] [id number] 1955-12-17  CARE TEAM: PCP: [name], DO  Outpatient Care Team: Patient Care Team: [name], DO as PCP - General (Family Medicine) [name], RN as [facility] Care Management [name], MD as Referring Physician (General Surgery)  Inpatient Treatment Team: Treatment Team: Attending Provider: [name], [name], DO; Consulting Physician: [name], MD; Charge Nurse: [name], RN; Rounding Team: [name], MD   Problem List:   Principal Problem:   SBO (small bowel obstruction) [name]) Active Problems:   CKD (chronic kidney disease) stage 3, GFR 30-59 ml/min (HCC)   * No surgery found *      Assessment [facility] Stay = 1 days)      Recurrent small bowel obstruction inpatient with prior pelvic radiation and numerous abdominal surgeries and prior bowel obstruction    Plan:  -Small bowel protocol implies contrast in colon but not having great flatus or bowel movements yet.  Given her history of significant adhesions recurrent bowel obstructions, will be conservative do NG tube clamping trial.  Clear liquids and check residuals every 6 hours.  If she has less than 250 an NG tube after 6 hours of p.o. and no nausea or vomiting, remove NG tube and advance to dysphagia 1/full liquid diet.  If she tolerates that today, suspect she can be advanced to a soft diet and possibly discharge in next day or so.  Suspect she would benefit from chronic MiraLAX to avoid future episodes of obstruction.  Other numerous medical issues through primary service -VTE prophylaxis- SCDs, etc -mobilize as tolerated to help recovery -Disposition: [facility].  Possibly home 6/7-6/8 if rapidly improves       I reviewed nursing notes, hospitalist notes, last 24 h vitals and pain scores, last 48 h intake and output, last 24 h labs and trends, and last 24 h imaging results.  I have reviewed this patient's  available data, including medical history, events of note, test results, etc as part of my evaluation.   A significant portion of that time was spent in counseling. Care during the described time interval was provided by me.  This care required moderate level of medical decision making.  11/21/2022    Subjective: (Chief complaint)  Patient worried NG tube may be clogged.  I flushed it it is working fine.  Board in bed and wants to get up.  Annoyed with alarms and IV pole.  Believes she is having some gas but no bowel movements yet  Denies any abdominal pain or nausea  Objective:  Vital signs:  Vitals:   11/20/22 1933 11/20/22 2027 11/21/22 0123 11/21/22 0526  BP:  118/73 128/76 122/76  Pulse:  75 71 63  Resp:  18 16 18   Temp:  98.8 F (37.1 C) 98.7 F (37.1 C) 97.8 F (36.6 C)  TempSrc:  Oral Oral Oral  SpO2: 98% 100% 98% 100%       Intake/Output   Yesterday:  06/05 0701 - 06/06 0700 In: 744.6 [P.O.:60; I.V.:684.6] Out: 600 [Urine:300; Emesis/NG output:300] This shift:  No intake/output data recorded.  Bowel function:  Flatus: YES  BM:  No  NGT: Bilious - thin   Physical Exam:  General: Pt awake/alert in no acute distress Eyes: PERRL, normal EOM.  Sclera clear.  No icterus Neuro: CN II-XII intact w/o focal sensory/motor deficits. Lymph: No head/neck/groin lymphadenopathy Psych:  No delerium/psychosis/paranoia.  Oriented x 4 HENT: Normocephalic, Mucus membranes moist.  No thrush Neck:  Supple, No tracheal deviation.  No obvious thyromegaly Chest: No pain to chest wall compression.  Good respiratory excursion.  No audible wheezing CV:  Pulses intact.  Regular rhythm.  No major extremity edema MS: Normal AROM mjr joints.  No obvious deformity  Abdomen: Soft.  Nondistended.  Nontender.  No evidence of peritonitis.  No incarcerated hernias.  Ext:   No deformity.  No mjr edema.  No cyanosis Skin: No petechiae / purpurea.  No major sores.  Warm and  dry    Results:   Cultures: No results found for this or any previous visit (from the past 720 hour(s)).  Labs: Results for orders placed or performed during the hospital encounter of 11/20/22 (from the past 48 hour(s))  Comprehensive metabolic panel     Status: Abnormal   Collection Time: 11/20/22  2:30 PM  Result Value Ref Range   Sodium 142 135 - 145 mmol/L   Potassium 4.0 3.5 - 5.1 mmol/L   Chloride 107 98 - 111 mmol/L   CO2 24 22 - 32 mmol/L   Glucose, Bld 157 (H) 70 - 99 mg/dL    Comment: Glucose reference range applies only to samples taken after fasting for at least 8 hours.   BUN 15 8 - 23 mg/dL   Creatinine, Ser 8.65 (H) 0.44 - 1.00 mg/dL   Calcium 9.2 8.9 - 78.4 mg/dL   Total Protein 6.6 6.5 - 8.1 g/dL   Albumin 3.9 3.5 - 5.0 g/dL   AST 15 15 - 41 U/L   ALT 14 0 - 44 U/L   Alkaline Phosphatase 62 38 - 126 U/L   Total Bilirubin 0.3 0.3 - 1.2 mg/dL   GFR, Estimated 45 (L) >60 mL/min    Comment: (NOTE) Calculated using the CKD-EPI Creatinine Equation (2021)    Anion gap 11 5 - 15    Comment: Performed at [facility], [address], [place], [place] [address]  Lipase, blood     Status: None   Collection Time: 11/20/22  2:30 PM  Result Value Ref Range   Lipase 23 11 - 51 U/L    Comment: Performed at [facility], [address], [place], [place] [address]  Urinalysis, Routine w reflex microscopic -Urine, Clean Catch     Status: Abnormal   Collection Time: 11/20/22  3:54 PM  Result Value Ref Range   Color, Urine YELLOW YELLOW   APPearance HAZY (A) CLEAR   Specific Gravity, Urine 1.024 1.005 - 1.030   pH 5.0 5.0 - 8.0   Glucose, UA NEGATIVE NEGATIVE mg/dL   Hgb urine dipstick NEGATIVE NEGATIVE   Bilirubin Urine NEGATIVE NEGATIVE   Ketones, ur NEGATIVE NEGATIVE mg/dL   Protein, ur 30 (A) NEGATIVE mg/dL   Nitrite NEGATIVE NEGATIVE   Leukocytes,Ua LARGE (A) NEGATIVE   RBC / HPF 11-20 0 - 5 RBC/hpf   WBC, UA >50 0 - 5  WBC/hpf   Bacteria, UA FEW (A) NONE SEEN   Squamous Epithelial / HPF 0-5 0 - 5 /HPF   Mucus PRESENT    Non Squamous Epithelial 0-5 (A) NONE SEEN    Comment: Performed at [facility], [address], [place], [place] [address]  CBC with Differential/Platelet     Status: Abnormal   Collection Time: 11/20/22  4:30 PM  Result Value Ref Range   WBC 5.4 4.0 - 10.5 K/uL   RBC 3.41 (L) 3.87 - 5.11 MIL/uL   Hemoglobin 11.0 (L) 12.0 -  15.0 g/dL   HCT 46.9 (L) 62.9 - 52.8 %   MCV 95.3 80.0 - 100.0 fL   MCH 32.3 26.0 - 34.0 pg   MCHC 33.8 30.0 - 36.0 g/dL   RDW 41.3 24.4 - 01.0 %   Platelets 126 (L) 150 - 400 K/uL   nRBC 0.0 0.0 - 0.2 %   Neutrophils Relative % 89 %   Neutro Abs 4.7 1.7 - 7.7 K/uL   Lymphocytes Relative 8 %   Lymphs Abs 0.4 (L) 0.7 - 4.0 K/uL   Monocytes Relative 3 %   Monocytes Absolute 0.2 0.1 - 1.0 K/uL   Eosinophils Relative 0 %   Eosinophils Absolute 0.0 0.0 - 0.5 K/uL   Basophils Relative 0 %   Basophils Absolute 0.0 0.0 - 0.1 K/uL   Immature Granulocytes 0 %   Abs Immature Granulocytes 0.02 0.00 - 0.07 K/uL    Comment: Performed at [facility], [address], [place], [place] [address]  CBC     Status: Abnormal   Collection Time: 11/21/22  4:51 AM  Result Value Ref Range   WBC 5.0 4.0 - 10.5 K/uL   RBC 3.32 (L) 3.87 - 5.11 MIL/uL   Hemoglobin 10.8 (L) 12.0 - 15.0 g/dL   HCT 66.4 (L) 40.3 - 47.4 %   MCV 94.9 80.0 - 100.0 fL   MCH 32.5 26.0 - 34.0 pg   MCHC 34.3 30.0 - 36.0 g/dL   RDW 25.9 56.3 - 87.5 %   Platelets 126 (L) 150 - 400 K/uL   nRBC 0.0 0.0 - 0.2 %    Comment: Performed at [facility], [address] [place], [place] [address]  Basic metabolic panel     Status: Abnormal   Collection Time: 11/21/22  4:51 AM  Result Value Ref Range   Sodium 141 135 - 145 mmol/L   Potassium 3.5 3.5 - 5.1 mmol/L   Chloride 108 98 - 111 mmol/L   CO2 25 22 - 32 mmol/L   Glucose, Bld 83 70 - 99 mg/dL     Comment: Glucose reference range applies only to samples taken after fasting for at least 8 hours.   BUN 14 8 - 23 mg/dL   Creatinine, Ser 9.51 (H) 0.44 - 1.00 mg/dL   Calcium 8.3 (L) 8.9 - 10.3 mg/dL   GFR, Estimated 53 (L) >60 mL/min    Comment: (NOTE) Calculated using the CKD-EPI Creatinine Equation (2021)    Anion gap 8 5 - 15    Comment: Performed at [facility], [address] [place], [place] [address]  Magnesium     Status: None   Collection Time: 11/21/22  4:51 AM  Result Value Ref Range   Magnesium 1.9 1.7 - 2.4 mg/dL    Comment: Performed at [facility], [address] [place], [place] [address]  Phosphorus     Status: None   Collection Time: 11/21/22  4:51 AM  Result Value Ref Range   Phosphorus 3.2 2.5 - 4.6 mg/dL    Comment: Performed at [facility], [address] [place], [place] [address]    Imaging / Studies: DG Abd Portable 1V  Result Date: 11/20/2022 CLINICAL DATA:  Check gastric catheter placement EXAM: PORTABLE ABDOMEN - 1 VIEW COMPARISON:  11/18/22 FINDINGS: Gastric catheter is noted within the stomach. No free air is noted. Contrast is seen within the collecting systems related to prior CT examination. Hydronephrotic changes are noted  on the left similar to that seen on the prior exam. No bony abnormality is noted. IMPRESSION: Gastric catheter within the distal stomach. Electronically Signed   By: Alcide Clever M.D.   On: 11/20/2022 20:52   CT ABDOMEN PELVIS W CONTRAST  Result Date: 11/20/2022 CLINICAL DATA:  Bowel obstruction suspected EXAM: CT ABDOMEN AND PELVIS WITH CONTRAST TECHNIQUE: Multidetector CT imaging of the abdomen and pelvis was performed using the standard protocol following bolus administration of intravenous contrast. RADIATION DOSE REDUCTION: This exam was performed according to the departmental dose-optimization program which includes automated exposure control, adjustment of the mA  and/or kV according to patient size and/or use of iterative reconstruction technique. CONTRAST:  80mL OMNIPAQUE IOHEXOL 300 MG/ML  SOLN COMPARISON:  Previous studies including the examination of 11/17/2022 FINDINGS: Lower chest: Visualized lower lung fields are clear. Hepatobiliary: Surgical clips are seen in gallbladder fossa. Intrahepatic bile ducts appear more prominent in comparison with the previous study. Distal common bile duct in the head of the pancreas measures 8 mm. Pancreas: There is atrophy.  No focal abnormalities are seen. Spleen: Unremarkable. Adrenals/Urinary Tract: There is 2.1 cm nodule in left adrenal which appears stable, possibly adenoma. There is a 9 mm nodule in the right adrenal which appears stable. Left kidneys much smaller than right. There are foci of parenchymal thinning at multiple sites in the left kidney suggesting scarring. There is no significant hydronephrosis. There is prominence of left renal pelvis without significant dilation of minor calyces. There are no renal or ureteral stones. Urinary bladder is not distended. Stomach/Bowel: Small hiatal hernia is seen. Stomach is not distended. There is dilation of few proximal small bowel loops measuring up to 3.3 cm in diameter. There is interval decrease in degree of proximal small bowel dilation. Distal small-bowel loops are decompressed. Zone of transition appears to be in left side of pelvis. Gas and stool are present in the lumen of colon. There is wall thickening in rectum which appears to be chronic. Minimal ascites is present adjacent to the proximal descending colon. There is no loculated pericolic fluid collection. Vascular/Lymphatic: Scattered arterial calcifications are seen. Reproductive: Uterus is not seen. Surgical clips are seen in pelvis. There are coarse calcifications in both sides of pelvis. Prominence of perirectal soft tissues has not changed. These findings may be residual from previous intervention such as  surgery and radiation. Other: There is no pneumoperitoneum. Minimal amount of ascites is noted in left mid abdomen adjacent to the descending colon. Musculoskeletal: No acute findings are seen. IMPRESSION: There is interval decrease in degree of proximal small bowel dilation. Still, there is moderate dilation of proximal small bowel loops with decompression of distal small bowel loops suggesting partial small bowel obstruction due to adhesions. There is no hydronephrosis. There is atrophy and cortical thinning in the left kidney with no interval change. Nodules are seen in both adrenals with no interval change. Intrahepatic bile ducts appear more prominent. This may be related to previous cholecystectomy. Less likely possibility would be stricture in the distal common bile duct. Chronic changes in the pelvis may be due to previous surgery and possibly radiation treatment. Small hiatal hernia. Aortic atherosclerosis. Electronically Signed   By: Ernie Avena M.D.   On: 11/20/2022 16:43    Medications / Allergies: per chart  Antibiotics: Anti-infectives (From admission, onward)    Start     Dose/Rate Route Frequency Ordered Stop   11/20/22 1730  cefTRIAXone (ROCEPHIN) 1 g in sodium chloride 0.9 % 100 mL  IVPB        1 g 200 mL/hr over 30 Minutes Intravenous  Once 11/20/22 1717 11/20/22 1905         Note: Portions of this report may have been transcribed using voice recognition software. Every effort was made to ensure accuracy; however, inadvertent computerized transcription errors may be present.   Any transcriptional errors that result from this process are unintentional.    [name], MD, FACS, MASCRS Esophageal, Gastrointestinal & Colorectal Surgery Robotic and Minimally Invasive Surgery  [facility] [address], [address] [place], [place] [address] [phone] Fax [phone] Main  CONTACT  INFORMATION:  Weekday (9AM-5PM): Call [facility] main office at [phone]  Weeknight (5PM-9AM) or Weekend/Holiday: Check [email or web address] (password " [email or web address]") for General Surgery [facility] coverage  (Please, do not use SecureChat as it is not reliable communication to reach operating surgeons for immediate patient care given surgeries/outpatient duties/clinic/cross-coverage/off post-call which would lead to a delay in care.  Epic staff messaging available for outptient concerns, but may not be answered for 48 hours or more).     11/21/2022  7:15 AM

## 2022-11-21 NOTE — TOC Progression Note (Signed)
Transition of Care (TOC) - Progression Note   Transition of Care [facility]) - Inpatient Brief Assessment  Patient Details  Name: [name] MRN: [id number] Date of Birth: 27-Oct-1955  Transition of Care [facility]) CM/SW Contact:    Ewing Schlein, LCSW Phone Number: 11/21/2022, 10:12 AM  Clinical Narrative: Screening completed. No TOC needs identified at this time.  Transition of Care Asessment: Insurance and Status: Insurance coverage has been reviewed Patient has primary care physician: Yes Home environment has been reviewed: From home with spouse Prior level of function:: Independent at baseline Prior/Current Home Services: No current home services Social Determinants of Health Reivew: SDOH reviewed no interventions necessary Readmission risk has been reviewed: Yes Transition of care needs: no transition of care needs at this time  Social Determinants of Health (SDOH) Interventions SDOH Screenings   Food Insecurity: No Food Insecurity (11/17/2022)  Housing: Low Risk  (11/17/2022)  Transportation Needs: No Transportation Needs (11/17/2022)  Utilities: Not At Risk (11/17/2022)  Depression (PHQ2-9): Low Risk  (11/06/2022)  Financial Resource Strain: Low Risk  (11/06/2022)  Stress: No Stress Concern Present (11/06/2022)  Tobacco Use: Medium Risk (11/20/2022)   Readmission Risk Interventions    11/21/2022   10:10 AM 11/18/2022    2:11 PM  Readmission Risk Prevention Plan  Post Dischage Appt  Complete  Medication Screening  Complete  Transportation Screening Complete Complete  HRI or Home Care Consult Complete   Social Work Consult for Recovery Care Planning/Counseling Complete   Palliative Care Screening Not Applicable   Medication Review Oceanographer) Complete

## 2022-11-21 NOTE — Progress Notes (Signed)
PROGRESS NOTE    [name]  ZOX:[id number] DOB: 04/15/1956 DOA: 11/20/2022 PCP: [name], DO    Brief Narrative:   [name] is a 67 y.o. female with past medical history significant for prior complicated abdominal surgeries including hemicolectomy, cervical cancer s/p hysterectomy, cholecystectomy, exploratory laparoscopy as well as prior pelvic radiation recently hospitalized 6/2 through 6/4 for small bowel obstruction who presents within 24 hours of discharge with recurrence of abdominal pain associate with nausea/vomiting.  Was followed by general surgery during her most recent hospitalization and improved quickly with conservative management with toleration of diet and multiple bowel movements and was discharged home with recommendation of outpatient follow-up; but unfortunately she had a recurrence of symptoms once again.  In the ED, temperature 98.0 F, HR 65, RR 16, BP 110/72, SpO2 100% on room air.  WBC 5.4, hemoglobin 11.0, platelets 126.  Sodium 142, potassium 4.0, chloride 107, CO2 24, glucose 157, BUN 15, creatinine 1.32.  Lipase 23.  AST 15, ALT 14, total bilirubin 0.3.  Urinalysis with large leukocytes, negative nitrite, few bacteria, greater than 50 WBCs.  CT abdomen/pelvis with interval decrease in degree of proximal small bowel dilation but with moderate dilation of proximal small bowel loops with decompression of distal small bowel loops suggesting partial small bowel obstruction due to adhesions, intrahepatic bile ducts more prominent likely related to previous cholecystectomy, chronic changes in pelvis likely from previous surgery/radiation.  Assessment & Plan:    Small bowel obstruction, recurrent Patient representing to the ED with recurrence of abdominal pain associate with nausea and vomiting.  Recently discharged home on 6/4 following recurrent small bowel obstruction.  Patient is afebrile without leukocytosis.  Lab base within normal limits.  Repeat CT  abdomen/pelvis noted decrease in degree of proximal small bowel dilation but with moderate dilation that persists consistent with partial small bowel obstruction. -- General surgery following, appreciate assistance -- NPO, NGT to LWIS -- LR with 20 mEq KCl at 75 mL/h -- Further per general surgery  Left upper extremity edema likely secondary to IV fluid infiltration Patient with negative edema to left upper arm, suspicion for IV infiltration.  Vascular duplex ultrasound left upper extremity negative for DVT or SVT. -- Supportive care, warm/cold compresses, elevation.  Abnormal urinalysis Urinalysis on admission with positive leukocytes, negative nitrite, rare bacteria, and greater than 50 WBCs.  Did receive one-time dose of ceftriaxone in the ED.  Patient asymptomatic, denies dysuria, increased urinary frequency. -- Urine culture: Pending -- Will hold on further antibiotics until received results of urine culture  CKD stage IIIb Baseline creatinine 1.1-1.2.  Creatinine 1.32 on admission. -- Cr 1.32>1.15; stable -- Continue IV fluid hydration as above while NPO -- Repeat BMP in a.m.  Hypothyroidism -- Hold home levothyroxine while n.p.o.  Hyperlipidemia -- Holding home simvastatin for now while NPO   DVT prophylaxis: enoxaparin (LOVENOX) injection 40 mg Start: 11/20/22 1830    Code Status: Full Code Family Communication: No family present at bedside this morning  Disposition Plan:  Level of care: Med-Surg Status is: Inpatient Remains inpatient appropriate because: N.p.o. with NG tube to wall suction, will need further diet advancement with toleration and in general surgery signed off before stable for discharge home    Consultants:  General surgery  Procedures:  None  Antimicrobials:  None   Subjective: Patient seen examined bedside, resting calmly.  Lying in bed.  Complaining of swelling to her left upper extremity.  Reports abdominal discomfort improved.  Discussed  with general surgery, Dr. Dwain Sarna this morning; given her recurrence in such a short period time we will take a more conservative management and will continue n.p.o. status with NG tube in place to wall suction today.  Discussed with patient will obtain ultrasound of her left upper extremity to rule out DVT, but likely etiology from infiltrate of IV fluids.  Discussed with RN.  Patient with no other complaints or concerns at this time.  Denies headache, no dizziness, no chest pain, no palpitations, no shortness of breath, no current nausea/vomiting, no diarrhea, no current abdominal pain, no focal weakness, no fatigue, no paresthesias.  No acute events overnight per nursing staff.  Objective: Vitals:   11/20/22 1933 11/20/22 2027 11/21/22 0123 11/21/22 0526  BP:  118/73 128/76 122/76  Pulse:  75 71 63  Resp:  18 16 18   Temp:  98.8 F (37.1 C) 98.7 F (37.1 C) 97.8 F (36.6 C)  TempSrc:  Oral Oral Oral  SpO2: 98% 100% 98% 100%    Intake/Output Summary (Last 24 hours) at 11/21/2022 1151 Last data filed at 11/21/2022 0600 Gross per 24 hour  Intake 744.56 ml  Output 600 ml  Net 144.56 ml   There were no vitals filed for this visit.  Examination:  Physical Exam: GEN: NAD, alert and oriented x 3, chronically ill in appearance HEENT: NCAT, PERRL, EOMI, sclera clear, MMM PULM: CTAB w/o wheezes/crackles, normal respiratory effort, no mare CV: RRR w/o M/G/R GI: abd soft, NTND, NABS, no R/G/M; NG tube in place to wall suction MSK: + LUE edema, otherwise no other peripheral edema, muscle strength globally intact 5/5 bilateral upper/lower extremities NEURO: CN II-XII intact, no focal deficits, sensation to light touch intact PSYCH: normal mood/affect Integumentary: dry/intact, no rashes or wounds    Data Reviewed: I have personally reviewed following labs and imaging studies  CBC: Recent Labs  Lab 11/17/22 1000 11/18/22 0433 11/19/22 0451 11/20/22 1630 11/21/22 0451  WBC 6.2 5.1  3.6* 5.4 5.0  NEUTROABS 4.9  --  2.0 4.7  --   HGB 14.4 11.7* 11.1* 11.0* 10.8*  HCT 41.0 34.5* 33.1* 32.5* 31.5*  MCV 91.5 96.4 94.0 95.3 94.9  PLT 169 133* 117* 126* 126*   Basic Metabolic Panel: Recent Labs  Lab 11/17/22 1000 11/18/22 0433 11/19/22 0451 11/20/22 1430 11/21/22 0451  NA 137 137 141 142 141  K 4.2 3.7 3.3* 4.0 3.5  CL 98 108 109 107 108  CO2 25 20* 26 24 25   GLUCOSE 133* 72 122* 157* 83  BUN 37* 32* 20 15 14   CREATININE 1.48* 1.23* 1.08* 1.32* 1.15*  CALCIUM 9.6 8.3* 8.4* 9.2 8.3*  MG  --   --  1.8  --  1.9  PHOS  --   --   --   --  3.2   GFR: Estimated Creatinine Clearance: 38.1 mL/min (A) (by C-G formula based on SCr of 1.15 mg/dL (H)). Liver Function Tests: Recent Labs  Lab 11/17/22 1000 11/18/22 0433 11/19/22 0451 11/20/22 1430  AST 15 11* 13* 15  ALT 13 10 11 14   ALKPHOS 79 53 52 62  BILITOT 1.3* 0.9 0.2* 0.3  PROT 7.7 5.6* 5.6* 6.6  ALBUMIN 4.4 3.4* 3.1* 3.9   Recent Labs  Lab 11/17/22 1000 11/20/22 1430  LIPASE 22 23   No results for input(s): "AMMONIA" in the last 168 hours. Coagulation Profile: No results for input(s): "INR", "PROTIME" in the last 168 hours. Cardiac Enzymes: No results for input(s): "CKTOTAL", "CKMB", "  CKMBINDEX", "TROPONINI" in the last 168 hours. BNP (last 3 results) No results for input(s): "PROBNP" in the last 8760 hours. HbA1C: No results for input(s): "HGBA1C" in the last 72 hours. CBG: No results for input(s): "GLUCAP" in the last 168 hours. Lipid Profile: No results for input(s): "CHOL", "HDL", "LDLCALC", "TRIG", "CHOLHDL", "LDLDIRECT" in the last 72 hours. Thyroid Function Tests: No results for input(s): "TSH", "T4TOTAL", "FREET4", "T3FREE", "THYROIDAB" in the last 72 hours. Anemia Panel: No results for input(s): "VITAMINB12", "FOLATE", "FERRITIN", "TIBC", "IRON", "RETICCTPCT" in the last 72 hours. Sepsis Labs: No results for input(s): "PROCALCITON", "LATICACIDVEN" in the last 168 hours.  No  results found for this or any previous visit (from the past 240 hour(s)).       Radiology Studies: VAS [place] UPPER EXTREMITY VENOUS DUPLEX  Result Date: 11/21/2022 UPPER VENOUS STUDY  Patient Name:  LINDSEA LELAND  Date of Exam:   11/21/2022 Medical Rec #: [id number]      Accession #:    [id number] Date of Birth: July 13, 1955     Patient Gender: F Patient Age:   66 years Exam Location:  [facility] Procedure:      VAS [place] UPPER EXTREMITY VENOUS DUPLEX Referring Phys: Tyquasia Pant [name] --------------------------------------------------------------------------------  Indications: Swelling Limitations: Body habitus and poor ultrasound/tissue interface. Comparison Study: No prior study Performing Technologist: Sherren Kerns RVS  Examination Guidelines: A complete evaluation includes B-mode imaging, spectral Doppler, color Doppler, and power Doppler as needed of all accessible portions of each vessel. Bilateral testing is considered an integral part of a complete examination. Limited examinations for reoccurring indications may be performed as noted.  Right Findings: +----------+------------+---------+-----------+----------+-------+ RIGHT     CompressiblePhasicitySpontaneousPropertiesSummary +----------+------------+---------+-----------+----------+-------+ Subclavian               Yes       Yes                      +----------+------------+---------+-----------+----------+-------+  Left Findings: +----------+------------+---------+-----------+----------+---------------------+ LEFT      CompressiblePhasicitySpontaneousProperties       Summary        +----------+------------+---------+-----------+----------+---------------------+ IJV           Full       Yes       Yes                                    +----------+------------+---------+-----------+----------+---------------------+ Subclavian    Full       Yes       Yes                                     +----------+------------+---------+-----------+----------+---------------------+ Axillary                 Yes       Yes               patent by color and                                                             Doppler        +----------+------------+---------+-----------+----------+---------------------+ Brachial  patent by color and                                                             Doppler        +----------+------------+---------+-----------+----------+---------------------+ Radial        Full                                                        +----------+------------+---------+-----------+----------+---------------------+ Ulnar         Full                                                        +----------+------------+---------+-----------+----------+---------------------+ Cephalic      Full                                                        +----------+------------+---------+-----------+----------+---------------------+ Basilic       Full                                                        +----------+------------+---------+-----------+----------+---------------------+  Summary:  Right: No evidence of deep vein thrombosis in the upper extremity. No evidence of superficial vein thrombosis in the upper extremity.  Left: No evidence of thrombosis in the subclavian.  *See table(s) above for measurements and observations.     Preliminary    DG Abd Portable 1V-Small Bowel Obstruction Protocol-initial, 8 hr delay  Result Date: 11/21/2022 CLINICAL DATA:  Small-bowel obstruction protocol. EXAM: PORTABLE ABDOMEN - 1 VIEW COMPARISON:  11/20/2022 at 8:43 p.m.  CT, 11/20/2022 at 4 p.m. FINDINGS: Contrast has extended into the colon. Dilated, partly air-filled loops of small bowel persist in the central abdomen. Nasogastric tube tip lies in the distal stomach. IMPRESSION: 1. Persistent dilated small  bowel in the central abdomen, although contrast has now extended from the small bowel into the colon. Findings support a low-grade partial obstruction versus a mild small bowel adynamic ileus Electronically Signed   By: Amie Portland M.D.   On: 11/21/2022 08:09   DG Abd Portable 1V  Result Date: 11/20/2022 CLINICAL DATA:  Check gastric catheter placement EXAM: PORTABLE ABDOMEN - 1 VIEW COMPARISON:  11/18/22 FINDINGS: Gastric catheter is noted within the stomach. No free air is noted. Contrast is seen within the collecting systems related to prior CT examination. Hydronephrotic changes are noted on the left similar to that seen on the prior exam. No bony abnormality is noted. IMPRESSION: Gastric catheter within the distal stomach. Electronically Signed   By: Alcide Clever M.D.   On: 11/20/2022 20:52   CT ABDOMEN PELVIS W CONTRAST  Result Date: 11/20/2022 CLINICAL DATA:  Bowel obstruction suspected EXAM: CT ABDOMEN AND PELVIS WITH CONTRAST TECHNIQUE: Multidetector CT imaging of the abdomen and pelvis was performed using the standard protocol following bolus administration of intravenous contrast. RADIATION DOSE REDUCTION: This exam was performed according to the departmental dose-optimization program which includes automated exposure control, adjustment of the mA and/or kV according to patient size and/or use of iterative reconstruction technique. CONTRAST:  80mL OMNIPAQUE IOHEXOL 300 MG/ML  SOLN COMPARISON:  Previous studies including the examination of 11/17/2022 FINDINGS: Lower chest: Visualized lower lung fields are clear. Hepatobiliary: Surgical clips are seen in gallbladder fossa. Intrahepatic bile ducts appear more prominent in comparison with the previous study. Distal common bile duct in the head of the pancreas measures 8 mm. Pancreas: There is atrophy.  No focal abnormalities are seen. Spleen: Unremarkable. Adrenals/Urinary Tract: There is 2.1 cm nodule in left adrenal which appears stable, possibly  adenoma. There is a 9 mm nodule in the right adrenal which appears stable. Left kidneys much smaller than right. There are foci of parenchymal thinning at multiple sites in the left kidney suggesting scarring. There is no significant hydronephrosis. There is prominence of left renal pelvis without significant dilation of minor calyces. There are no renal or ureteral stones. Urinary bladder is not distended. Stomach/Bowel: Small hiatal hernia is seen. Stomach is not distended. There is dilation of few proximal small bowel loops measuring up to 3.3 cm in diameter. There is interval decrease in degree of proximal small bowel dilation. Distal small-bowel loops are decompressed. Zone of transition appears to be in left side of pelvis. Gas and stool are present in the lumen of colon. There is wall thickening in rectum which appears to be chronic. Minimal ascites is present adjacent to the proximal descending colon. There is no loculated pericolic fluid collection. Vascular/Lymphatic: Scattered arterial calcifications are seen. Reproductive: Uterus is not seen. Surgical clips are seen in pelvis. There are coarse calcifications in both sides of pelvis. Prominence of perirectal soft tissues has not changed. These findings may be residual from previous intervention such as surgery and radiation. Other: There is no pneumoperitoneum. Minimal amount of ascites is noted in left mid abdomen adjacent to the descending colon. Musculoskeletal: No acute findings are seen. IMPRESSION: There is interval decrease in degree of proximal small bowel dilation. Still, there is moderate dilation of proximal small bowel loops with decompression of distal small bowel loops suggesting partial small bowel obstruction due to adhesions. There is no hydronephrosis. There is atrophy and cortical thinning in the left kidney with no interval change. Nodules are seen in both adrenals with no interval change. Intrahepatic bile ducts appear more prominent.  This may be related to previous cholecystectomy. Less likely possibility would be stricture in the distal common bile duct. Chronic changes in the pelvis may be due to previous surgery and possibly radiation treatment. Small hiatal hernia. Aortic atherosclerosis. Electronically Signed   By: Ernie Avena M.D.   On: 11/20/2022 16:43        Scheduled Meds:  bisacodyl  10 mg Rectal Daily   enoxaparin (LOVENOX) injection  40 mg Subcutaneous Daily   lip balm   Topical BID   sodium chloride flush  3 mL Intravenous Q12H   Continuous Infusions:  sodium chloride     methocarbamol (ROBAXIN) IV     ondansetron (ZOFRAN) IV       LOS: 1 day    Time spent: 54 minutes spent on chart review, discussion with nursing staff, consultants, updating family and interview/physical  exam; more than 50% of that time was spent in counseling and/or coordination of care.    Alvira Philips [name], DO [facility] Available via Epic secure chat 7am-7pm After these hours, please refer to coverage provider listed on [email or web address] 11/21/2022, 11:51 AM

## 2022-11-21 NOTE — Progress Notes (Signed)
Subjective/Chief Complaint: Had a loose bm already no ab pain   Objective: Vital signs in last 24 hours: Temp:  [97.8 F (36.6 C)-98.8 F (37.1 C)] 97.8 F (36.6 C) (06/06 0526) Pulse Rate:  [63-75] 63 (06/06 0526) Resp:  [16-21] 18 (06/06 0526) BP: (105-128)/(61-76) 122/76 (06/06 0526) SpO2:  [98 %-100 %] 100 % (06/06 0526)    Intake/Output from previous day: 06/05 0701 - 06/06 0700 In: 744.6 [P.O.:60; I.V.:684.6] Out: 600 [Urine:300; Emesis/NG output:300] Intake/Output this shift: No intake/output data recorded.  Ab soft nontender nondistended  Lab Results:  Recent Labs    11/20/22 1630 11/21/22 0451  WBC 5.4 5.0  HGB 11.0* 10.8*  HCT 32.5* 31.5*  PLT 126* 126*   BMET Recent Labs    11/20/22 1430 11/21/22 0451  NA 142 141  K 4.0 3.5  CL 107 108  CO2 24 25  GLUCOSE 157* 83  BUN 15 14  CREATININE 1.32* 1.15*  CALCIUM 9.2 8.3*   PT/INR No results for input(s): "LABPROT", "INR" in the last 72 hours. ABG No results for input(s): "PHART", "HCO3" in the last 72 hours.  Invalid input(s): "PCO2", "PO2"  Studies/Results: DG Abd Portable 1V-Small Bowel Obstruction Protocol-initial, 8 hr delay  Result Date: 11/21/2022 CLINICAL DATA:  Small-bowel obstruction protocol. EXAM: PORTABLE ABDOMEN - 1 VIEW COMPARISON:  11/20/2022 at 8:43 p.m.  CT, 11/20/2022 at 4 p.m. FINDINGS: Contrast has extended into the colon. Dilated, partly air-filled loops of small bowel persist in the central abdomen. Nasogastric tube tip lies in the distal stomach. IMPRESSION: 1. Persistent dilated small bowel in the central abdomen, although contrast has now extended from the small bowel into the colon. Findings support a low-grade partial obstruction versus a mild small bowel adynamic ileus Electronically Signed   By: Amie Portland M.D.   On: 11/21/2022 08:09   DG Abd Portable 1V  Result Date: 11/20/2022 CLINICAL DATA:  Check gastric catheter placement EXAM: PORTABLE ABDOMEN - 1 VIEW  COMPARISON:  11/18/22 FINDINGS: Gastric catheter is noted within the stomach. No free air is noted. Contrast is seen within the collecting systems related to prior CT examination. Hydronephrotic changes are noted on the left similar to that seen on the prior exam. No bony abnormality is noted. IMPRESSION: Gastric catheter within the distal stomach. Electronically Signed   By: Alcide Clever M.D.   On: 11/20/2022 20:52   CT ABDOMEN PELVIS W CONTRAST  Result Date: 11/20/2022 CLINICAL DATA:  Bowel obstruction suspected EXAM: CT ABDOMEN AND PELVIS WITH CONTRAST TECHNIQUE: Multidetector CT imaging of the abdomen and pelvis was performed using the standard protocol following bolus administration of intravenous contrast. RADIATION DOSE REDUCTION: This exam was performed according to the departmental dose-optimization program which includes automated exposure control, adjustment of the mA and/or kV according to patient size and/or use of iterative reconstruction technique. CONTRAST:  80mL OMNIPAQUE IOHEXOL 300 MG/ML  SOLN COMPARISON:  Previous studies including the examination of 11/17/2022 FINDINGS: Lower chest: Visualized lower lung fields are clear. Hepatobiliary: Surgical clips are seen in gallbladder fossa. Intrahepatic bile ducts appear more prominent in comparison with the previous study. Distal common bile duct in the head of the pancreas measures 8 mm. Pancreas: There is atrophy.  No focal abnormalities are seen. Spleen: Unremarkable. Adrenals/Urinary Tract: There is 2.1 cm nodule in left adrenal which appears stable, possibly adenoma. There is a 9 mm nodule in the right adrenal which appears stable. Left kidneys much smaller than right. There are foci of parenchymal thinning at  multiple sites in the left kidney suggesting scarring. There is no significant hydronephrosis. There is prominence of left renal pelvis without significant dilation of minor calyces. There are no renal or ureteral stones. Urinary bladder is  not distended. Stomach/Bowel: Small hiatal hernia is seen. Stomach is not distended. There is dilation of few proximal small bowel loops measuring up to 3.3 cm in diameter. There is interval decrease in degree of proximal small bowel dilation. Distal small-bowel loops are decompressed. Zone of transition appears to be in left side of pelvis. Gas and stool are present in the lumen of colon. There is wall thickening in rectum which appears to be chronic. Minimal ascites is present adjacent to the proximal descending colon. There is no loculated pericolic fluid collection. Vascular/Lymphatic: Scattered arterial calcifications are seen. Reproductive: Uterus is not seen. Surgical clips are seen in pelvis. There are coarse calcifications in both sides of pelvis. Prominence of perirectal soft tissues has not changed. These findings may be residual from previous intervention such as surgery and radiation. Other: There is no pneumoperitoneum. Minimal amount of ascites is noted in left mid abdomen adjacent to the descending colon. Musculoskeletal: No acute findings are seen. IMPRESSION: There is interval decrease in degree of proximal small bowel dilation. Still, there is moderate dilation of proximal small bowel loops with decompression of distal small bowel loops suggesting partial small bowel obstruction due to adhesions. There is no hydronephrosis. There is atrophy and cortical thinning in the left kidney with no interval change. Nodules are seen in both adrenals with no interval change. Intrahepatic bile ducts appear more prominent. This may be related to previous cholecystectomy. Less likely possibility would be stricture in the distal common bile duct. Chronic changes in the pelvis may be due to previous surgery and possibly radiation treatment. Small hiatal hernia. Aortic atherosclerosis. Electronically Signed   By: Ernie Avena M.D.   On: 11/20/2022 16:43    Anti-infectives: Anti-infectives (From admission,  onward)    Start     Dose/Rate Route Frequency Ordered Stop   11/20/22 1730  cefTRIAXone (ROCEPHIN) 1 g in sodium chloride 0.9 % 100 mL IVPB        1 g 200 mL/hr over 30 Minutes Intravenous  Once 11/20/22 1717 11/20/22 1905       Assessment/Plan: SBO -last episode had resolved clinically by time we saw her although on last film had some dilated mildly small bowel.  Recurred (or never really got better) quickly.  Will treat standard fashion this time with ng tube and protocol.  She already has contrast in her colon on film.  Will go slow this time and keep ng and npo today repeat film in am   FEN - NPO VTE - Lovenox ID - none needed Emelia Loron 11/21/2022

## 2022-11-22 ENCOUNTER — Inpatient Hospital Stay (HOSPITAL_COMMUNITY): Payer: Medicare Other

## 2022-11-22 DIAGNOSIS — K56609 Unspecified intestinal obstruction, unspecified as to partial versus complete obstruction: Secondary | ICD-10-CM | POA: Diagnosis not present

## 2022-11-22 LAB — BASIC METABOLIC PANEL
Anion gap: 11 (ref 5–15)
BUN: 10 mg/dL (ref 8–23)
CO2: 26 mmol/L (ref 22–32)
Calcium: 8.8 mg/dL — ABNORMAL LOW (ref 8.9–10.3)
Chloride: 103 mmol/L (ref 98–111)
Creatinine, Ser: 0.95 mg/dL (ref 0.44–1.00)
GFR, Estimated: >60 mL/min (ref >60)
Glucose, Bld: 77 mg/dL (ref 70–99)
Potassium: 3.5 mmol/L (ref 3.5–5.1)
Sodium: 140 mmol/L (ref 135–145)

## 2022-11-22 LAB — MAGNESIUM: Magnesium: 1.8 mg/dL (ref 1.7–2.4)

## 2022-11-22 MED ORDER — LEVOTHYROXINE SODIUM 25 MCG PO TABS
25.0000 ug | ORAL_TABLET | Freq: Every day | ORAL | Status: DC
  Administered 2022-11-23 – 2022-11-24 (×2): 25 ug via ORAL
  Filled 2022-11-22 (×2): qty 1

## 2022-11-22 NOTE — Progress Notes (Signed)
Mobility Specialist - Progress Note   11/22/22 0940  Mobility  Activity Ambulated independently in hallway  Level of Assistance Independent  Assistive Device None  Distance Ambulated (ft) 850 ft  Activity Response Tolerated well  Mobility Referral Yes  $Mobility charge 1 Mobility  Mobility Specialist Start Time (ACUTE ONLY) 0939   Pt received in bed and agreeable to mobility. No complaints during session. Pt to bed after session with all needs met.    [facility]

## 2022-11-22 NOTE — Progress Notes (Signed)
PROGRESS NOTE    [name]  ZOX:[id number] DOB: 04-11-1956 DOA: 11/20/2022 PCP: [name], DO    Brief Narrative:   [name] is a 67 y.o. female with past medical history significant for prior complicated abdominal surgeries including hemicolectomy, cervical cancer s/p hysterectomy, cholecystectomy, exploratory laparoscopy as well as prior pelvic radiation recently hospitalized 6/2 through 6/4 for small bowel obstruction who presents within 24 hours of discharge with recurrence of abdominal pain associate with nausea/vomiting.  Was followed by general surgery during her most recent hospitalization and improved quickly with conservative management with toleration of diet and multiple bowel movements and was discharged home with recommendation of outpatient follow-up; but unfortunately she had a recurrence of symptoms once again.  In the ED, temperature 98.0 F, HR 65, RR 16, BP 110/72, SpO2 100% on room air.  WBC 5.4, hemoglobin 11.0, platelets 126.  Sodium 142, potassium 4.0, chloride 107, CO2 24, glucose 157, BUN 15, creatinine 1.32.  Lipase 23.  AST 15, ALT 14, total bilirubin 0.3.  Urinalysis with large leukocytes, negative nitrite, few bacteria, greater than 50 WBCs.  CT abdomen/pelvis with interval decrease in degree of proximal small bowel dilation but with moderate dilation of proximal small bowel loops with decompression of distal small bowel loops suggesting partial small bowel obstruction due to adhesions, intrahepatic bile ducts more prominent likely related to previous cholecystectomy, chronic changes in pelvis likely from previous surgery/radiation.  Assessment & Plan:    Small bowel obstruction, recurrent Patient representing to the ED with recurrence of abdominal pain associate with nausea and vomiting.  Recently discharged home on 6/4 following recurrent small bowel obstruction.  Patient is afebrile without leukocytosis.  Lab base within normal limits.  Repeat CT  abdomen/pelvis noted decrease in degree of proximal small bowel dilation but with moderate dilation that persists consistent with partial small bowel obstruction. -- General surgery following, appreciate assistance -- NG tube clamped, starting clear liquid diet today -- LR with 20 mEq KCl at 75 mL/h -- Further per general surgery  Left upper extremity edema likely secondary to IV fluid infiltration Patient with negative edema to left upper arm, suspicion for IV infiltration.  Vascular duplex ultrasound left upper extremity negative for DVT or SVT. -- Supportive care, warm/cold compresses, elevation.  Abnormal urinalysis Urinalysis on admission with positive leukocytes, negative nitrite, rare bacteria, and greater than 50 WBCs.  Did receive one-time dose of ceftriaxone in the ED.  Patient asymptomatic, denies dysuria, increased urinary frequency.  Urine culture with multiple species.   CKD stage IIIb Baseline creatinine 1.1-1.2.  Creatinine 1.32 on admission. -- Cr 1.32>1.15>0.95; stable -- Continue IV fluid hydration as above  Hypothyroidism -- Restart home thyroxine 25 mcg p.o. daily tomorrow morning  Hyperlipidemia -- Holding home simvastatin for now    DVT prophylaxis: enoxaparin (LOVENOX) injection 40 mg Start: 11/20/22 1830    Code Status: Full Code Family Communication: No family present at bedside this morning  Disposition Plan:  Level of care: Med-Surg Status is: Inpatient Remains inpatient appropriate because: N.p.o. with NG tube to wall suction, will need further diet advancement with toleration and in general surgery signed off before stable for discharge home    Consultants:  General surgery  Procedures:  None  Antimicrobials:  Ceftriaxone 6/5 - 6/5   Subjective: Patient seen examined bedside, resting calmly.  Lying in bed.  No complaints this morning.  Discussed with general surgery, Dr. Dwain Sarna, clamping NG tube and starting clear liquid diet today.  Patient reports bowel movement yesterday.  Denies headache, no dizziness, no chest pain, no palpitations, no shortness of breath, no current nausea/vomiting, no diarrhea, no current abdominal pain, no focal weakness, no fatigue, no paresthesias.  No acute events overnight per nursing staff.  Objective: Vitals:   11/21/22 1408 11/21/22 2106 11/22/22 0629 11/22/22 1208  BP: (!) 147/83 (!) 143/80 137/76 125/79  Pulse: 65 61 68 77  Resp: 20 15 18    Temp: 98.3 F (36.8 C) 98 F (36.7 C) 97.9 F (36.6 C) 98 F (36.7 C)  TempSrc: Oral Oral Oral Oral  SpO2: 99% 100% 99% 97%    Intake/Output Summary (Last 24 hours) at 11/22/2022 1222 Last data filed at 11/22/2022 0933 Gross per 24 hour  Intake 1982.05 ml  Output 3550 ml  Net -1567.95 ml   There were no vitals filed for this visit.  Examination:  Physical Exam: GEN: NAD, alert and oriented x 3, chronically ill in appearance HEENT: NCAT, PERRL, EOMI, sclera clear, MMM PULM: CTAB w/o wheezes/crackles, normal respiratory effort, no mare CV: RRR w/o M/G/R GI: abd soft, NTND, NABS, no R/G/M; NG tube in place to wall suction MSK: + LUE edema, otherwise no other peripheral edema, muscle strength globally intact 5/5 bilateral upper/lower extremities NEURO: CN II-XII intact, no focal deficits, sensation to light touch intact PSYCH: normal mood/affect Integumentary: dry/intact, no rashes or wounds    Data Reviewed: I have personally reviewed following labs and imaging studies  CBC: Recent Labs  Lab 11/17/22 1000 11/18/22 0433 11/19/22 0451 11/20/22 1630 11/21/22 0451  WBC 6.2 5.1 3.6* 5.4 5.0  NEUTROABS 4.9  --  2.0 4.7  --   HGB 14.4 11.7* 11.1* 11.0* 10.8*  HCT 41.0 34.5* 33.1* 32.5* 31.5*  MCV 91.5 96.4 94.0 95.3 94.9  PLT 169 133* 117* 126* 126*   Basic Metabolic Panel: Recent Labs  Lab 11/18/22 0433 11/19/22 0451 11/20/22 1430 11/21/22 0451 11/22/22 0520  NA 137 141 142 141 140  K 3.7 3.3* 4.0 3.5 3.5  CL 108 109 107  108 103  CO2 20* 26 24 25 26   GLUCOSE 72 122* 157* 83 77  BUN 32* 20 15 14 10   CREATININE 1.23* 1.08* 1.32* 1.15* 0.95  CALCIUM 8.3* 8.4* 9.2 8.3* 8.8*  MG  --  1.8  --  1.9 1.8  PHOS  --   --   --  3.2  --    GFR: Estimated Creatinine Clearance: 46.1 mL/min (by C-G formula based on SCr of 0.95 mg/dL). Liver Function Tests: Recent Labs  Lab 11/17/22 1000 11/18/22 0433 11/19/22 0451 11/20/22 1430  AST 15 11* 13* 15  ALT 13 10 11 14   ALKPHOS 79 53 52 62  BILITOT 1.3* 0.9 0.2* 0.3  PROT 7.7 5.6* 5.6* 6.6  ALBUMIN 4.4 3.4* 3.1* 3.9   Recent Labs  Lab 11/17/22 1000 11/20/22 1430  LIPASE 22 23   No results for input(s): "AMMONIA" in the last 168 hours. Coagulation Profile: No results for input(s): "INR", "PROTIME" in the last 168 hours. Cardiac Enzymes: No results for input(s): "CKTOTAL", "CKMB", "CKMBINDEX", "TROPONINI" in the last 168 hours. BNP (last 3 results) No results for input(s): "PROBNP" in the last 8760 hours. HbA1C: No results for input(s): "HGBA1C" in the last 72 hours. CBG: No results for input(s): "GLUCAP" in the last 168 hours. Lipid Profile: No results for input(s): "CHOL", "HDL", "LDLCALC", "TRIG", "CHOLHDL", "LDLDIRECT" in the last 72 hours. Thyroid Function Tests: No results for input(s): "TSH", "T4TOTAL", "FREET4", "  T3FREE", "THYROIDAB" in the last 72 hours. Anemia Panel: No results for input(s): "VITAMINB12", "FOLATE", "FERRITIN", "TIBC", "IRON", "RETICCTPCT" in the last 72 hours. Sepsis Labs: No results for input(s): "PROCALCITON", "LATICACIDVEN" in the last 168 hours.  Recent Results (from the past 240 hour(s))  Urine Culture     Status: Abnormal   Collection Time: 11/20/22  3:54 PM   Specimen: Urine, Clean Catch  Result Value Ref Range Status   Specimen Description   Final    URINE, CLEAN CATCH Performed at [facility], [address] [place], [place] [address]    Special Requests   Final    NONE Performed at [facility], [address] [place], [place] [address]    Culture MULTIPLE SPECIES PRESENT, SUGGEST RECOLLECTION (A)  Final   Report Status 11/21/2022 FINAL  Final         Radiology Studies: DG Abd Portable 1V-Small Bowel Obstruction Protocol-24 hr delay  Result Date: 11/22/2022 CLINICAL DATA:  Small-bowel obstruction. EXAM: PORTABLE ABDOMEN - 1 VIEW COMPARISON:  11/21/2022 FINDINGS: Nasogastric tube extends into the distal stomach. No further dilated small bowel visualized. There is some dilute oral contrast in the colon. This is further dilute in appearance compared to yesterday's film. IMPRESSION: Nasogastric tube extends into the distal stomach. No further dilated small bowel visualized. Electronically Signed   By: Irish Lack M.D.   On: 11/22/2022 10:50   VAS [place] UPPER EXTREMITY VENOUS DUPLEX  Result Date: 11/21/2022 UPPER VENOUS STUDY  Patient Name:  [name]  Date of Exam:   11/21/2022 Medical Rec #: [id number]      Accession #:    [id number] Date of Birth: 11-04-1955     Patient Gender: F Patient Age:   68 years Exam Location:  [facility] Procedure:      VAS [place] UPPER EXTREMITY VENOUS DUPLEX Referring Phys: Naw Lasala [name] --------------------------------------------------------------------------------  Indications: Swelling Limitations: Body habitus and poor ultrasound/tissue interface. Comparison Study: No prior study Performing Technologist: Sherren Kerns RVS  Examination Guidelines: A complete evaluation includes B-mode imaging, spectral Doppler, color Doppler, and power Doppler as needed of all accessible portions of each vessel. Bilateral testing is considered an integral part of a complete examination. Limited examinations for reoccurring indications may be performed as noted.  Right Findings: +----------+------------+---------+-----------+----------+-------+ RIGHT     CompressiblePhasicitySpontaneousPropertiesSummary  +----------+------------+---------+-----------+----------+-------+ Subclavian               Yes       Yes                      +----------+------------+---------+-----------+----------+-------+  Left Findings: +----------+------------+---------+-----------+----------+---------------------+ LEFT      CompressiblePhasicitySpontaneousProperties       Summary        +----------+------------+---------+-----------+----------+---------------------+ IJV           Full       Yes       Yes                                    +----------+------------+---------+-----------+----------+---------------------+ Subclavian    Full       Yes       Yes                                    +----------+------------+---------+-----------+----------+---------------------+ Axillary  Yes       Yes               patent by color and                                                             Doppler        +----------+------------+---------+-----------+----------+---------------------+ Brachial                                             patent by color and                                                             Doppler        +----------+------------+---------+-----------+----------+---------------------+ Radial        Full                                                        +----------+------------+---------+-----------+----------+---------------------+ Ulnar         Full                                                        +----------+------------+---------+-----------+----------+---------------------+ Cephalic      Full                                                        +----------+------------+---------+-----------+----------+---------------------+ Basilic       Full                                                        +----------+------------+---------+-----------+----------+---------------------+  Summary:  Right: No evidence  of deep vein thrombosis in the upper extremity. No evidence of superficial vein thrombosis in the upper extremity.  Left: No evidence of thrombosis in the subclavian.  *See table(s) above for measurements and observations.  Diagnosing physician: Sherald Hess MD Electronically signed by Sherald Hess MD on 11/21/2022 at 1:41:35 PM.    Final    DG Abd Portable 1V-Small Bowel Obstruction Protocol-initial, 8 hr delay  Result Date: 11/21/2022 CLINICAL DATA:  Small-bowel obstruction protocol. EXAM: PORTABLE ABDOMEN - 1 VIEW COMPARISON:  11/20/2022 at 8:43 p.m.  CT, 11/20/2022 at 4 p.m. FINDINGS: Contrast has extended into the colon. Dilated, partly air-filled loops of small bowel persist in the central abdomen. Nasogastric tube tip lies in the  distal stomach. IMPRESSION: 1. Persistent dilated small bowel in the central abdomen, although contrast has now extended from the small bowel into the colon. Findings support a low-grade partial obstruction versus a mild small bowel adynamic ileus Electronically Signed   By: Amie Portland M.D.   On: 11/21/2022 08:09   DG Abd Portable 1V  Result Date: 11/20/2022 CLINICAL DATA:  Check gastric catheter placement EXAM: PORTABLE ABDOMEN - 1 VIEW COMPARISON:  11/18/22 FINDINGS: Gastric catheter is noted within the stomach. No free air is noted. Contrast is seen within the collecting systems related to prior CT examination. Hydronephrotic changes are noted on the left similar to that seen on the prior exam. No bony abnormality is noted. IMPRESSION: Gastric catheter within the distal stomach. Electronically Signed   By: Alcide Clever M.D.   On: 11/20/2022 20:52   CT ABDOMEN PELVIS W CONTRAST  Result Date: 11/20/2022 CLINICAL DATA:  Bowel obstruction suspected EXAM: CT ABDOMEN AND PELVIS WITH CONTRAST TECHNIQUE: Multidetector CT imaging of the abdomen and pelvis was performed using the standard protocol following bolus administration of intravenous contrast. RADIATION DOSE  REDUCTION: This exam was performed according to the departmental dose-optimization program which includes automated exposure control, adjustment of the mA and/or kV according to patient size and/or use of iterative reconstruction technique. CONTRAST:  80mL OMNIPAQUE IOHEXOL 300 MG/ML  SOLN COMPARISON:  Previous studies including the examination of 11/17/2022 FINDINGS: Lower chest: Visualized lower lung fields are clear. Hepatobiliary: Surgical clips are seen in gallbladder fossa. Intrahepatic bile ducts appear more prominent in comparison with the previous study. Distal common bile duct in the head of the pancreas measures 8 mm. Pancreas: There is atrophy.  No focal abnormalities are seen. Spleen: Unremarkable. Adrenals/Urinary Tract: There is 2.1 cm nodule in left adrenal which appears stable, possibly adenoma. There is a 9 mm nodule in the right adrenal which appears stable. Left kidneys much smaller than right. There are foci of parenchymal thinning at multiple sites in the left kidney suggesting scarring. There is no significant hydronephrosis. There is prominence of left renal pelvis without significant dilation of minor calyces. There are no renal or ureteral stones. Urinary bladder is not distended. Stomach/Bowel: Small hiatal hernia is seen. Stomach is not distended. There is dilation of few proximal small bowel loops measuring up to 3.3 cm in diameter. There is interval decrease in degree of proximal small bowel dilation. Distal small-bowel loops are decompressed. Zone of transition appears to be in left side of pelvis. Gas and stool are present in the lumen of colon. There is wall thickening in rectum which appears to be chronic. Minimal ascites is present adjacent to the proximal descending colon. There is no loculated pericolic fluid collection. Vascular/Lymphatic: Scattered arterial calcifications are seen. Reproductive: Uterus is not seen. Surgical clips are seen in pelvis. There are coarse  calcifications in both sides of pelvis. Prominence of perirectal soft tissues has not changed. These findings may be residual from previous intervention such as surgery and radiation. Other: There is no pneumoperitoneum. Minimal amount of ascites is noted in left mid abdomen adjacent to the descending colon. Musculoskeletal: No acute findings are seen. IMPRESSION: There is interval decrease in degree of proximal small bowel dilation. Still, there is moderate dilation of proximal small bowel loops with decompression of distal small bowel loops suggesting partial small bowel obstruction due to adhesions. There is no hydronephrosis. There is atrophy and cortical thinning in the left kidney with no interval change. Nodules are seen in both adrenals  with no interval change. Intrahepatic bile ducts appear more prominent. This may be related to previous cholecystectomy. Less likely possibility would be stricture in the distal common bile duct. Chronic changes in the pelvis may be due to previous surgery and possibly radiation treatment. Small hiatal hernia. Aortic atherosclerosis. Electronically Signed   By: Ernie Avena M.D.   On: 11/20/2022 16:43        Scheduled Meds:  bisacodyl  10 mg Rectal Daily   enoxaparin (LOVENOX) injection  40 mg Subcutaneous Daily   lip balm   Topical BID   sodium chloride flush  3 mL Intravenous Q12H   Continuous Infusions:  sodium chloride     lactated ringers 1,000 mL with potassium chloride 20 mEq infusion 75 mL/hr at 11/21/22 1324   methocarbamol (ROBAXIN) IV     ondansetron (ZOFRAN) IV       LOS: 2 days    Time spent: 54 minutes spent on chart review, discussion with nursing staff, consultants, updating family and interview/physical exam; more than 50% of that time was spent in counseling and/or coordination of care.    Alvira Philips [name], DO [facility] Available via Epic secure chat 7am-7pm After these hours, please refer to coverage provider  listed on [email or web address] 11/22/2022, 12:22 PM

## 2022-11-22 NOTE — Progress Notes (Signed)
Pt hooked back up to suction after being clamped for 6 hours. Residual 0-5 ml therefore NGT discontinued.

## 2022-11-22 NOTE — Progress Notes (Signed)
Subjective/Chief Complaint: Having flatus and bms, no ab pain   Objective: Vital signs in last 24 hours: Temp:  [97.9 F (36.6 C)-98.3 F (36.8 C)] 97.9 F (36.6 C) (06/07 0629) Pulse Rate:  [61-68] 68 (06/07 0629) Resp:  [15-20] 18 (06/07 0629) BP: (137-147)/(76-83) 137/76 (06/07 0629) SpO2:  [99 %-100 %] 99 % (06/07 0629) Last BM Date : 11/22/22  Intake/Output from previous day: 06/06 0701 - 06/07 0700 In: 2402.1 [P.O.:300; I.V.:1742.1; NG/GT:60] Out: 3400 [Urine:1900; Emesis/NG output:1500] Intake/Output this shift: No intake/output data recorded.  Ab soft nontender nondistended, ng with minimal gastric contents nonbilious  Lab Results:  Recent Labs    11/20/22 1630 11/21/22 0451  WBC 5.4 5.0  HGB 11.0* 10.8*  HCT 32.5* 31.5*  PLT 126* 126*   BMET Recent Labs    11/21/22 0451 11/22/22 0520  NA 141 140  K 3.5 3.5  CL 108 103  CO2 25 26  GLUCOSE 83 77  BUN 14 10  CREATININE 1.15* 0.95  CALCIUM 8.3* 8.8*   PT/INR No results for input(s): "LABPROT", "INR" in the last 72 hours. ABG No results for input(s): "PHART", "HCO3" in the last 72 hours.  Invalid input(s): "PCO2", "PO2"  Studies/Results: VAS [place] UPPER EXTREMITY VENOUS DUPLEX  Result Date: 11/21/2022 UPPER VENOUS STUDY  Patient Name:  [name]  Date of Exam:   11/21/2022 Medical Rec #: [id number]      Accession #:    [id number] Date of Birth: 19-Apr-1956     Patient Gender: F Patient Age:   46 years Exam Location:  [facility] Procedure:      VAS [place] UPPER EXTREMITY VENOUS DUPLEX Referring Phys: ERIC [name] --------------------------------------------------------------------------------  Indications: Swelling Limitations: Body habitus and poor ultrasound/tissue interface. Comparison Study: No prior study Performing Technologist: Sherren Kerns RVS  Examination Guidelines: A complete evaluation includes B-mode imaging, spectral Doppler, color Doppler, and power Doppler as needed of all  accessible portions of each vessel. Bilateral testing is considered an integral part of a complete examination. Limited examinations for reoccurring indications may be performed as noted.  Right Findings: +----------+------------+---------+-----------+----------+-------+ RIGHT     CompressiblePhasicitySpontaneousPropertiesSummary +----------+------------+---------+-----------+----------+-------+ Subclavian               Yes       Yes                      +----------+------------+---------+-----------+----------+-------+  Left Findings: +----------+------------+---------+-----------+----------+---------------------+ LEFT      CompressiblePhasicitySpontaneousProperties       Summary        +----------+------------+---------+-----------+----------+---------------------+ IJV           Full       Yes       Yes                                    +----------+------------+---------+-----------+----------+---------------------+ Subclavian    Full       Yes       Yes                                    +----------+------------+---------+-----------+----------+---------------------+ Axillary                 Yes       Yes               patent by color  and                                                             Doppler        +----------+------------+---------+-----------+----------+---------------------+ Brachial                                             patent by color and                                                             Doppler        +----------+------------+---------+-----------+----------+---------------------+ Radial        Full                                                        +----------+------------+---------+-----------+----------+---------------------+ Ulnar         Full                                                        +----------+------------+---------+-----------+----------+---------------------+ Cephalic       Full                                                        +----------+------------+---------+-----------+----------+---------------------+ Basilic       Full                                                        +----------+------------+---------+-----------+----------+---------------------+  Summary:  Right: No evidence of deep vein thrombosis in the upper extremity. No evidence of superficial vein thrombosis in the upper extremity.  Left: No evidence of thrombosis in the subclavian.  *See table(s) above for measurements and observations.  Diagnosing physician: Sherald Hess MD Electronically signed by Sherald Hess MD on 11/21/2022 at 1:41:35 PM.    Final    DG Abd Portable 1V-Small Bowel Obstruction Protocol-initial, 8 hr delay  Result Date: 11/21/2022 CLINICAL DATA:  Small-bowel obstruction protocol. EXAM: PORTABLE ABDOMEN - 1 VIEW COMPARISON:  11/20/2022 at 8:43 p.m.  CT, 11/20/2022 at 4 p.m. FINDINGS: Contrast has extended into the colon. Dilated, partly air-filled loops of small bowel persist in the central abdomen. Nasogastric tube tip lies in the distal stomach. IMPRESSION: 1. Persistent dilated small bowel in the central abdomen, although contrast has now extended from the small bowel into the colon. Findings  support a low-grade partial obstruction versus a mild small bowel adynamic ileus Electronically Signed   By: Amie Portland M.D.   On: 11/21/2022 08:09   DG Abd Portable 1V  Result Date: 11/20/2022 CLINICAL DATA:  Check gastric catheter placement EXAM: PORTABLE ABDOMEN - 1 VIEW COMPARISON:  11/18/22 FINDINGS: Gastric catheter is noted within the stomach. No free air is noted. Contrast is seen within the collecting systems related to prior CT examination. Hydronephrotic changes are noted on the left similar to that seen on the prior exam. No bony abnormality is noted. IMPRESSION: Gastric catheter within the distal stomach. Electronically Signed   By: Alcide Clever M.D.   On:  11/20/2022 20:52   CT ABDOMEN PELVIS W CONTRAST  Result Date: 11/20/2022 CLINICAL DATA:  Bowel obstruction suspected EXAM: CT ABDOMEN AND PELVIS WITH CONTRAST TECHNIQUE: Multidetector CT imaging of the abdomen and pelvis was performed using the standard protocol following bolus administration of intravenous contrast. RADIATION DOSE REDUCTION: This exam was performed according to the departmental dose-optimization program which includes automated exposure control, adjustment of the mA and/or kV according to patient size and/or use of iterative reconstruction technique. CONTRAST:  80mL OMNIPAQUE IOHEXOL 300 MG/ML  SOLN COMPARISON:  Previous studies including the examination of 11/17/2022 FINDINGS: Lower chest: Visualized lower lung fields are clear. Hepatobiliary: Surgical clips are seen in gallbladder fossa. Intrahepatic bile ducts appear more prominent in comparison with the previous study. Distal common bile duct in the head of the pancreas measures 8 mm. Pancreas: There is atrophy.  No focal abnormalities are seen. Spleen: Unremarkable. Adrenals/Urinary Tract: There is 2.1 cm nodule in left adrenal which appears stable, possibly adenoma. There is a 9 mm nodule in the right adrenal which appears stable. Left kidneys much smaller than right. There are foci of parenchymal thinning at multiple sites in the left kidney suggesting scarring. There is no significant hydronephrosis. There is prominence of left renal pelvis without significant dilation of minor calyces. There are no renal or ureteral stones. Urinary bladder is not distended. Stomach/Bowel: Small hiatal hernia is seen. Stomach is not distended. There is dilation of few proximal small bowel loops measuring up to 3.3 cm in diameter. There is interval decrease in degree of proximal small bowel dilation. Distal small-bowel loops are decompressed. Zone of transition appears to be in left side of pelvis. Gas and stool are present in the lumen of colon. There is  wall thickening in rectum which appears to be chronic. Minimal ascites is present adjacent to the proximal descending colon. There is no loculated pericolic fluid collection. Vascular/Lymphatic: Scattered arterial calcifications are seen. Reproductive: Uterus is not seen. Surgical clips are seen in pelvis. There are coarse calcifications in both sides of pelvis. Prominence of perirectal soft tissues has not changed. These findings may be residual from previous intervention such as surgery and radiation. Other: There is no pneumoperitoneum. Minimal amount of ascites is noted in left mid abdomen adjacent to the descending colon. Musculoskeletal: No acute findings are seen. IMPRESSION: There is interval decrease in degree of proximal small bowel dilation. Still, there is moderate dilation of proximal small bowel loops with decompression of distal small bowel loops suggesting partial small bowel obstruction due to adhesions. There is no hydronephrosis. There is atrophy and cortical thinning in the left kidney with no interval change. Nodules are seen in both adrenals with no interval change. Intrahepatic bile ducts appear more prominent. This may be related to previous cholecystectomy. Less likely possibility would be stricture in the  distal common bile duct. Chronic changes in the pelvis may be due to previous surgery and possibly radiation treatment. Small hiatal hernia. Aortic atherosclerosis. Electronically Signed   By: Ernie Avena M.D.   On: 11/20/2022 16:43    Anti-infectives: Anti-infectives (From admission, onward)    Start     Dose/Rate Route Frequency Ordered Stop   11/20/22 1730  cefTRIAXone (ROCEPHIN) 1 g in sodium chloride 0.9 % 100 mL IVPB        1 g 200 mL/hr over 30 Minutes Intravenous  Once 11/20/22 1717 11/20/22 1905       Assessment/Plan: SBO -last episode had resolved clinically by time we saw her although on last film had some dilated mildly small bowel.  Recurred (or never  really got better) quickly.  Will treat standard fashion this time with ng tube and protocol.  She already has contrast in her colon on film.   -she is really back to normal today. Will try to clamp tube, clears and hopefully avoid surgery   FEN - clears VTE - Lovenox ID - none needed  [name] 11/22/2022

## 2022-11-23 ENCOUNTER — Encounter (HOSPITAL_COMMUNITY): Payer: Self-pay | Admitting: Internal Medicine

## 2022-11-23 DIAGNOSIS — K56609 Unspecified intestinal obstruction, unspecified as to partial versus complete obstruction: Secondary | ICD-10-CM | POA: Diagnosis not present

## 2022-11-23 MED ORDER — FENTANYL CITRATE PF 50 MCG/ML IJ SOSY: 12.5000 ug | PREFILLED_SYRINGE | INTRAMUSCULAR | Status: DC | PRN

## 2022-11-23 MED ORDER — LACTATED RINGERS IV BOLUS: 1000.0000 mL | Freq: Three times a day (TID) | INTRAVENOUS | Status: DC | PRN

## 2022-11-23 MED ORDER — BISACODYL 10 MG RE SUPP: 10.0000 mg | Freq: Two times a day (BID) | RECTAL | Status: DC | PRN

## 2022-11-23 MED ORDER — POLYETHYLENE GLYCOL 3350 17 G PO PACK
17.0000 g | PACK | Freq: Two times a day (BID) | ORAL | Status: DC
  Administered 2022-11-23 (×2): 17 g via ORAL
  Filled 2022-11-23 (×2): qty 1

## 2022-11-23 MED ORDER — HALOPERIDOL LACTATE 5 MG/ML IJ SOLN
2.0000 mg | Freq: Four times a day (QID) | INTRAMUSCULAR | Status: DC | PRN
  Administered 2022-11-23: 2 mg via INTRAVENOUS
  Filled 2022-11-23: qty 1

## 2022-11-23 MED ORDER — SODIUM CHLORIDE 0.9% FLUSH
3.0000 mL | Freq: Two times a day (BID) | INTRAVENOUS | Status: DC
  Administered 2022-11-23: 3 mL via INTRAVENOUS

## 2022-11-23 MED ORDER — SODIUM CHLORIDE 0.9% FLUSH: 3.0000 mL | INTRAVENOUS | Status: DC | PRN

## 2022-11-23 MED ORDER — SODIUM CHLORIDE 0.9 % IV SOLN: 250.0000 mL | INTRAVENOUS | Status: DC | PRN

## 2022-11-23 NOTE — Progress Notes (Signed)
Pt's husband has arrived to unit. He is currently in room with the pt. Pt has been sitting on sofa, in room, with feet elevated on sofa. She appears calm, yet remains confused to place, time, situation.

## 2022-11-23 NOTE — Progress Notes (Signed)
PROGRESS NOTE    [name]  RUE:[id number] DOB: 1955-08-17 DOA: 11/20/2022 PCP: [name], DO    Brief Narrative:   [name] is a 67 y.o. female with past medical history significant for prior complicated abdominal surgeries including hemicolectomy, cervical cancer s/p hysterectomy, cholecystectomy, exploratory laparoscopy as well as prior pelvic radiation recently hospitalized 6/2 through 6/4 for small bowel obstruction who presents within 24 hours of discharge with recurrence of abdominal pain associate with nausea/vomiting.  Was followed by general surgery during her most recent hospitalization and improved quickly with conservative management with toleration of diet and multiple bowel movements and was discharged home with recommendation of outpatient follow-up; but unfortunately she had a recurrence of symptoms once again.  In the ED, temperature 98.0 F, HR 65, RR 16, BP 110/72, SpO2 100% on room air.  WBC 5.4, hemoglobin 11.0, platelets 126.  Sodium 142, potassium 4.0, chloride 107, CO2 24, glucose 157, BUN 15, creatinine 1.32.  Lipase 23.  AST 15, ALT 14, total bilirubin 0.3.  Urinalysis with large leukocytes, negative nitrite, few bacteria, greater than 50 WBCs.  CT abdomen/pelvis with interval decrease in degree of proximal small bowel dilation but with moderate dilation of proximal small bowel loops with decompression of distal small bowel loops suggesting partial small bowel obstruction due to adhesions, intrahepatic bile ducts more prominent likely related to previous cholecystectomy, chronic changes in pelvis likely from previous surgery/radiation.  Assessment & Plan:    Small bowel obstruction, recurrent Patient representing to the ED with recurrence of abdominal pain associate with nausea and vomiting.  Recently discharged home on 6/4 following recurrent small bowel obstruction.  Patient is afebrile without leukocytosis.  Lab base within normal limits.  Repeat CT  abdomen/pelvis noted decrease in degree of proximal small bowel dilation but with moderate dilation that persists consistent with partial small bowel obstruction.  General surgery was consulted and NG tube was placed to low wall intermittent suction.  Tube was clamped with toleration and subsequently removed. -- General surgery following, appreciate assistance -- Diet advanced to soft diet today -- Further per general surgery hopeful for potential discharge home tomorrow  Left upper extremity edema likely secondary to IV fluid infiltration Patient with negative edema to left upper arm, suspicion for IV infiltration.  Vascular duplex ultrasound left upper extremity negative for DVT or SVT. -- Supportive care, warm/cold compresses, elevation.  Abnormal urinalysis Urinalysis on admission with positive leukocytes, negative nitrite, rare bacteria, and greater than 50 WBCs.  Did receive one-time dose of ceftriaxone in the ED.  Patient asymptomatic, denies dysuria, increased urinary frequency.  Urine culture with multiple species.   CKD stage IIIb Baseline creatinine 1.1-1.2.  Creatinine 1.32 on admission. -- Cr 1.32>1.15>0.95; stable -- Continue IV fluid hydration as above  Hypothyroidism -- Levothyroxine 25 mcg p.o. daily  Hyperlipidemia -- Holding home simvastatin for now   Concern for cognitive impairment Patient with intermittent confusion during hospitalization.  Although she is alert and oriented.  Recommend outpatient follow-up with PCP, consideration of neurocognitive testing with neurology versus psychiatry outpatient.   DVT prophylaxis: enoxaparin (LOVENOX) injection 40 mg Start: 11/20/22 1830    Code Status: Full Code Family Communication: Updated spouse present at bedside this morning  Disposition Plan:  Level of care: Med-Surg Status is: Inpatient Remains inpatient appropriate because: Advance to soft diet, if tolerates anticipate discharge home tomorrow    Consultants:   General surgery  Procedures:  None  Antimicrobials:  Ceftriaxone 6/5 - 6/5  Subjective: Patient seen examined bedside, resting calmly.  Sitting in bedside chair.  Spouse present.  Asking about advance diet and when she will be able to go home.  Reports bowel movements.  Seen by general surgery, Dr. Michaell Cowing this morning and diet advanced to soft diet; recommend continued monitoring overnight and if stable and tolerates diet likely discharge home tomorrow.  Discussed with RN, concern for confusion; suspect underlying cognitive impairment versus early dementia and would benefit from outpatient testing.  Denies headache, no dizziness, no chest pain, no palpitations, no shortness of breath, no current nausea/vomiting, no diarrhea, no current abdominal pain, no focal weakness, no fatigue, no paresthesias.  No acute events overnight per nursing staff.  Objective: Vitals:   11/22/22 1208 11/22/22 2103 11/23/22 0444 11/23/22 0700  BP: 125/79 (!) 132/101 (!) 140/81   Pulse: 77 85 74   Resp:  19 16   Temp: 98 F (36.7 C) 98.1 F (36.7 C) 97.8 F (36.6 C)   TempSrc: Oral Oral Oral   SpO2: 97% 99% 99%   Weight:    56.7 kg  Height:    5\' 2"  (1.575 m)    Intake/Output Summary (Last 24 hours) at 11/23/2022 1117 Last data filed at 11/23/2022 1000 Gross per 24 hour  Intake 2143.3 ml  Output 400 ml  Net 1743.3 ml   Filed Weights   11/23/22 0700  Weight: 56.7 kg    Examination:  Physical Exam: GEN: NAD, alert and oriented x 3, chronically ill in appearance HEENT: NCAT, PERRL, EOMI, sclera clear, MMM PULM: CTAB w/o wheezes/crackles, normal respiratory effort, on room air CV: RRR w/o M/G/R GI: abd soft, NTND, NABS, no R/G/M; NG tube in place to wall suction MSK: + LUE edema, otherwise no other peripheral edema, muscle strength globally intact 5/5 bilateral upper/lower extremities NEURO: CN II-XII intact, no focal deficits, sensation to light touch intact PSYCH: normal  mood/affect Integumentary: dry/intact, no rashes or wounds    Data Reviewed: I have personally reviewed following labs and imaging studies  CBC: Recent Labs  Lab 11/17/22 1000 11/18/22 0433 11/19/22 0451 11/20/22 1630 11/21/22 0451  WBC 6.2 5.1 3.6* 5.4 5.0  NEUTROABS 4.9  --  2.0 4.7  --   HGB 14.4 11.7* 11.1* 11.0* 10.8*  HCT 41.0 34.5* 33.1* 32.5* 31.5*  MCV 91.5 96.4 94.0 95.3 94.9  PLT 169 133* 117* 126* 126*   Basic Metabolic Panel: Recent Labs  Lab 11/18/22 0433 11/19/22 0451 11/20/22 1430 11/21/22 0451 11/22/22 0520  NA 137 141 142 141 140  K 3.7 3.3* 4.0 3.5 3.5  CL 108 109 107 108 103  CO2 20* 26 24 25 26   GLUCOSE 72 122* 157* 83 77  BUN 32* 20 15 14 10   CREATININE 1.23* 1.08* 1.32* 1.15* 0.95  CALCIUM 8.3* 8.4* 9.2 8.3* 8.8*  MG  --  1.8  --  1.9 1.8  PHOS  --   --   --  3.2  --    GFR: Estimated Creatinine Clearance: 46.1 mL/min (by C-G formula based on SCr of 0.95 mg/dL). Liver Function Tests: Recent Labs  Lab 11/17/22 1000 11/18/22 0433 11/19/22 0451 11/20/22 1430  AST 15 11* 13* 15  ALT 13 10 11 14   ALKPHOS 79 53 52 62  BILITOT 1.3* 0.9 0.2* 0.3  PROT 7.7 5.6* 5.6* 6.6  ALBUMIN 4.4 3.4* 3.1* 3.9   Recent Labs  Lab 11/17/22 1000 11/20/22 1430  LIPASE 22 23   No results for input(s): "AMMONIA" in  the last 168 hours. Coagulation Profile: No results for input(s): "INR", "PROTIME" in the last 168 hours. Cardiac Enzymes: No results for input(s): "CKTOTAL", "CKMB", "CKMBINDEX", "TROPONINI" in the last 168 hours. BNP (last 3 results) No results for input(s): "PROBNP" in the last 8760 hours. HbA1C: No results for input(s): "HGBA1C" in the last 72 hours. CBG: No results for input(s): "GLUCAP" in the last 168 hours. Lipid Profile: No results for input(s): "CHOL", "HDL", "LDLCALC", "TRIG", "CHOLHDL", "LDLDIRECT" in the last 72 hours. Thyroid Function Tests: No results for input(s): "TSH", "T4TOTAL", "FREET4", "T3FREE", "THYROIDAB" in the  last 72 hours. Anemia Panel: No results for input(s): "VITAMINB12", "FOLATE", "FERRITIN", "TIBC", "IRON", "RETICCTPCT" in the last 72 hours. Sepsis Labs: No results for input(s): "PROCALCITON", "LATICACIDVEN" in the last 168 hours.  Recent Results (from the past 240 hour(s))  Urine Culture     Status: Abnormal   Collection Time: 11/20/22  3:54 PM   Specimen: Urine, Clean Catch  Result Value Ref Range Status   Specimen Description   Final    URINE, CLEAN CATCH Performed at [facility], [address], [place], [place] [address]    Special Requests   Final    NONE Performed at [facility], [address] [place], [place] [address]    Culture MULTIPLE SPECIES PRESENT, SUGGEST RECOLLECTION (A)  Final   Report Status 11/21/2022 FINAL  Final         Radiology Studies: DG Abd Portable 1V-Small Bowel Obstruction Protocol-24 hr delay  Result Date: 11/22/2022 CLINICAL DATA:  Small-bowel obstruction. EXAM: PORTABLE ABDOMEN - 1 VIEW COMPARISON:  11/21/2022 FINDINGS: Nasogastric tube extends into the distal stomach. No further dilated small bowel visualized. There is some dilute oral contrast in the colon. This is further dilute in appearance compared to yesterday's film. IMPRESSION: Nasogastric tube extends into the distal stomach. No further dilated small bowel visualized. Electronically Signed   By: Irish Lack M.D.   On: 11/22/2022 10:50        Scheduled Meds:  bisacodyl  10 mg Rectal Daily   enoxaparin (LOVENOX) injection  40 mg Subcutaneous Daily   levothyroxine  25 mcg Oral Q0600   lip balm   Topical BID   polyethylene glycol  17 g Oral BID   sodium chloride flush  3 mL Intravenous Q12H   sodium chloride flush  3 mL Intravenous Q12H   Continuous Infusions:  sodium chloride     sodium chloride     lactated ringers     methocarbamol (ROBAXIN) IV     ondansetron (ZOFRAN) IV       LOS: 3 days    Time spent: 54 minutes spent on  chart review, discussion with nursing staff, consultants, updating family and interview/physical exam; more than 50% of that time was spent in counseling and/or coordination of care.    Alvira Philips [name], DO [facility] Available via Epic secure chat 7am-7pm After these hours, please refer to coverage provider listed on [email or web address] 11/23/2022, 11:17 AM

## 2022-11-23 NOTE — Progress Notes (Signed)
Safety sitter in rm w pt, update given to husband and he wants to come and be w pt. Prn Haldol given. Pt currently in her room

## 2022-11-23 NOTE — Progress Notes (Signed)
Patient is pleasant, but understanding of situation is not fully complete. Patient believing

## 2022-11-23 NOTE — Progress Notes (Signed)
Pt anxious, restless, verbally aggressive today. Pt very forgetful - instructed pt to NOT flush her BM's bc staff must see and record information. Pt verbalized understanding of instructions (altho argumentative). When this nurse returned 2 min later and placed a reminder sign behind the commode saying do not flush, pt became upset and wanted to know why she could not just flush her Bms. Again reviewed instructions w pt and she again stated she understood. Later called to rm, pt had an all liquid brown BM, sm amt w lots of toilet paper. Pt pacing in the hallways

## 2022-11-23 NOTE — Progress Notes (Signed)
Pt very restless, angry, pacing the hallways w her belongings bag. Pt stated that she wants to go home. Explained in detail to pt that she is not yet medically ready for d/c. Pt upset that none of her family has visited her today. Explained to pt that her husband has indeed spent approx 6 hours here w her and has just left. Pt acted surprised and said "whatever!" Phone call to husband to explain above and that there is no d/c order. Call to MD X 2 to relay above info, orders noted for safety sitter and Haldol. MD stated "Pt is not competent to sign AMA forms, call security and give her Haldol." Orders noted and shared w AC. Call back to husband w update

## 2022-11-23 NOTE — Progress Notes (Signed)
11/23/2022  [name] [id number] 21-Dec-1955  CARE TEAM: PCP: Kaitlin Ip, DO  Outpatient Care Team: Patient Care Team: Kaitlin Ip, DO as PCP - General (Family Medicine) Kaitlin Gallant, RN as [facility] Care Management Kaitlin Fermo, MD as Referring Physician (General Surgery)  Inpatient Treatment Team: Treatment Team: Attending Provider: [name], [name], DO; Consulting Physician: Kaitlin Limbo, MD; Rounding Team: Kaitlin Gilford, MD; Registered Nurse: Kaitlin Browner, RN   Problem List:   Principal Problem:   SBO (small bowel obstruction) (HCC) Active Problems:   CKD (chronic kidney disease) stage 3, GFR 30-59 ml/min (HCC)   * No surgery found *      Assessment [name] Stay = 3 days)      Partial small bowel obstruction appears to be resolving nonoperatively.    Plan:  -FEN: Tolerated NG tube clamping trial.  NG tube removed.  gradually advance diet  Concerns or mental confusion.  No focal deficits.  Vital signs are stable.  No major concerns from physical exam or laboratory values.  She is completely oriented and seems okay.  Will defer to primary service. -VTE prophylaxis:  enoxaparin, SCDs, etc -mobilize as tolerated to help recovery -ID: No evidence of infection = no antibiotics at this time -Disposition: Primary service. -Hopefully home tomorrow if continues to do well.     I reviewed nursing notes, hospitalist notes, last 24 h vitals and pain scores, last 48 h intake and output, last 24 h labs and trends, and last 24 h imaging results.  I have reviewed this patient's available data, including medical history, events of note, test results, etc as part of my evaluation.   A significant portion of that time was spent in counseling. Care during the described time interval was provided by me.  This care required moderate level of medical decision making.  11/23/2022    Subjective: (Chief complaint)  Patient restless in  room.  Annoyed with IV fluid pole.  Feels like she "cleaned out" a lot of bowel movements.  Nursing Aggie Raymond in her room  Objective:  Vital signs:  Vitals:   11/22/22 1208 11/22/22 2103 11/23/22 0444 11/23/22 0700  BP: 125/79 (!) 132/101 (!) 140/81   Pulse: 77 85 74   Resp:  19 16   Temp: 98 F (36.7 C) 98.1 F (36.7 C) 97.8 F (36.6 C)   TempSrc: Oral Oral Oral   SpO2: 97% 99% 99%   Weight:    56.7 kg  Height:    5\' 2"  (1.575 m)    Last BM Date : 11/22/22  Intake/Output   Yesterday:  06/07 0701 - 06/08 0700 In: 2023.3 [P.O.:600; I.V.:1423.3] Out: 700 [Urine:400; Emesis/NG output:300] This shift:  No intake/output data recorded.  Bowel function:  Flatus: YES  BM:  YES  Drain: (No drain)   Physical Exam:  General: Pt awake/alert in no acute distress.  In street close.  Walking in room without any guarding or hesitancy.  Not agitated.  No evidence of any delirium.  Not belligerent Eyes: PERRL, normal EOM.  Sclera clear.  No icterus Neuro: CN II-XII intact w/o focal sensory/motor deficits. Lymph: No head/neck/groin lymphadenopathy Psych:  No delerium/psychosis/paranoia.  Oriented x 4 HENT: Normocephalic, Mucus membranes moist.  No thrush Neck: Supple, No tracheal deviation.  No obvious thyromegaly Chest: No pain to chest wall compression.  Good respiratory excursion.  No audible wheezing CV:  Pulses intact.  Regular rhythm.  No major extremity edema MS: Normal AROM mjr joints.  No obvious deformity  Abdomen: Soft.  Nondistended.  Nontender.  No evidence of peritonitis.  No incarcerated hernias.  Ext:   No deformity.  No mjr edema.  No cyanosis Skin: No petechiae / purpurea.  No major sores.  Warm and dry    Results:   Cultures: Recent Results (from the past 720 hour(s))  Urine Culture     Status: Abnormal   Collection Time: 11/20/22  3:54 PM   Specimen: Urine, Clean Catch  Result Value Ref Range Status   Specimen Description   Final    URINE, CLEAN  CATCH Performed at [facility], [address], [place], [place] [address]    Special Requests   Final    NONE Performed at [facility], [address], [place], [place] [address]    Culture MULTIPLE SPECIES PRESENT, SUGGEST RECOLLECTION (A)  Final   Report Status 11/21/2022 FINAL  Final    Labs: Results for orders placed or performed during the Raymond encounter of 11/20/22 (from the past 48 hour(s))  Basic metabolic panel     Status: Abnormal   Collection Time: 11/22/22  5:20 AM  Result Value Ref Range   Sodium 140 135 - 145 mmol/L   Potassium 3.5 3.5 - 5.1 mmol/L   Chloride 103 98 - 111 mmol/L   CO2 26 22 - 32 mmol/L   Glucose, Bld 77 70 - 99 mg/dL    Comment: Glucose reference range applies only to samples taken after fasting for at least 8 hours.   BUN 10 8 - 23 mg/dL   Creatinine, Ser 5.62 0.44 - 1.00 mg/dL   Calcium 8.8 (L) 8.9 - 10.3 mg/dL   GFR, Estimated >13 >08 mL/min    Comment: (NOTE) Calculated using the CKD-EPI Creatinine Equation (2021)    Anion gap 11 5 - 15    Comment: Performed at [facility], [address], [place], [place] [address]  Magnesium     Status: None   Collection Time: 11/22/22  5:20 AM  Result Value Ref Range   Magnesium 1.8 1.7 - 2.4 mg/dL    Comment: Performed at [facility], [address] [place], [place] [address]    Imaging / Studies: DG Abd Portable 1V-Small Bowel Obstruction Protocol-24 hr delay  Result Date: 11/22/2022 CLINICAL DATA:  Small-bowel obstruction. EXAM: PORTABLE ABDOMEN - 1 VIEW COMPARISON:  11/21/2022 FINDINGS: Nasogastric tube extends into the distal stomach. No further dilated small bowel visualized. There is some dilute oral contrast in the colon. This is further dilute in appearance compared to yesterday's film. IMPRESSION: Nasogastric tube extends into the distal stomach. No further dilated small bowel visualized. Electronically Signed    By: [name] M.D.   On: 11/22/2022 10:50   VAS [place] UPPER EXTREMITY VENOUS DUPLEX  Result Date: 11/21/2022 UPPER VENOUS STUDY  Patient Name:  [name]  Date of Exam:   11/21/2022 Medical Rec #: [id number]      Accession #:    [id number] Date of Birth: [birth year]     Patient Gender: F Patient Age:   25 years Exam Location:  [facility] Procedure:      VAS [place] UPPER EXTREMITY VENOUS DUPLEX Referring Phys: ERIC [name] --------------------------------------------------------------------------------  Indications: Swelling Limitations: Body habitus and poor ultrasound/tissue interface. Comparison Study: No prior study Performing Technologist: Sherren Kerns RVS  Examination Guidelines: A complete evaluation includes B-mode imaging, spectral Doppler, color Doppler, and power Doppler as needed of all  accessible portions of each vessel. Bilateral testing is considered an integral part of a complete examination. Limited examinations for reoccurring indications may be performed as noted.  Right Findings: +----------+------------+---------+-----------+----------+-------+ RIGHT     CompressiblePhasicitySpontaneousPropertiesSummary +----------+------------+---------+-----------+----------+-------+ Subclavian               Yes       Yes                      +----------+------------+---------+-----------+----------+-------+  Left Findings: +----------+------------+---------+-----------+----------+---------------------+ LEFT      CompressiblePhasicitySpontaneousProperties       Summary        +----------+------------+---------+-----------+----------+---------------------+ IJV           Full       Yes       Yes                                    +----------+------------+---------+-----------+----------+---------------------+ Subclavian    Full       Yes       Yes                                    +----------+------------+---------+-----------+----------+---------------------+  Axillary                 Yes       Yes               patent by color and                                                             Doppler        +----------+------------+---------+-----------+----------+---------------------+ Brachial                                             patent by color and                                                             Doppler        +----------+------------+---------+-----------+----------+---------------------+ Radial        Full                                                        +----------+------------+---------+-----------+----------+---------------------+ Ulnar         Full                                                        +----------+------------+---------+-----------+----------+---------------------+ Cephalic      Full                                                        +----------+------------+---------+-----------+----------+---------------------+  Basilic       Full                                                        +----------+------------+---------+-----------+----------+---------------------+  Summary:  Right: No evidence of deep vein thrombosis in the upper extremity. No evidence of superficial vein thrombosis in the upper extremity.  Left: No evidence of thrombosis in the subclavian.  *See table(s) above for measurements and observations.  Diagnosing physician: Sherald Hess MD Electronically signed by Sherald Hess MD on 11/21/2022 at 1:41:35 PM.    Final     Medications / Allergies: per chart  Antibiotics: Anti-infectives (From admission, onward)    Start     Dose/Rate Route Frequency Ordered Stop   11/20/22 1730  cefTRIAXone (ROCEPHIN) 1 g in sodium chloride 0.9 % 100 mL IVPB        1 g 200 mL/hr over 30 Minutes Intravenous  Once 11/20/22 1717 11/20/22 1905         Note: Portions of this report may have been transcribed using voice recognition software. Every  effort was made to ensure accuracy; however, inadvertent computerized transcription errors may be present.   Any transcriptional errors that result from this process are unintentional.    [name], MD, FACS, MASCRS Esophageal, Gastrointestinal & Colorectal Surgery Robotic and Minimally Invasive Surgery  [facility] [address], [address] [place], [place] [address] [phone] Fax [phone] Main  CONTACT INFORMATION:  Weekday (9AM-5PM): Call [facility] main office at [phone]  Weeknight (5PM-9AM) or Weekend/Holiday: Check [email or web address] (password " [email or web address]") for General Surgery [facility] coverage  (Please, do not use SecureChat as it is not reliable communication to reach operating surgeons for immediate patient care given surgeries/outpatient duties/clinic/cross-coverage/off post-call which would lead to a delay in care.  Epic staff messaging available for outptient concerns, but may not be answered for 48 hours or more).     11/23/2022  8:50 AM

## 2022-11-24 ENCOUNTER — Encounter (HOSPITAL_COMMUNITY): Payer: Self-pay | Admitting: Internal Medicine

## 2022-11-24 DIAGNOSIS — K56609 Unspecified intestinal obstruction, unspecified as to partial versus complete obstruction: Secondary | ICD-10-CM | POA: Diagnosis not present

## 2022-11-24 DIAGNOSIS — R410 Disorientation, unspecified: Secondary | ICD-10-CM | POA: Clinically undetermined

## 2022-11-24 DIAGNOSIS — R413 Other amnesia: Secondary | ICD-10-CM | POA: Insufficient documentation

## 2022-11-24 MED ORDER — POLYETHYLENE GLYCOL 3350 17 G PO PACK: 17.0000 g | PACK | Freq: Every day | ORAL | 2 refills | Status: AC

## 2022-11-24 MED ORDER — POLYETHYLENE GLYCOL 3350 17 G PO PACK: 17.0000 g | PACK | Freq: Every day | ORAL | Status: DC

## 2022-11-24 NOTE — Discharge Summary (Signed)
Physician Discharge Summary  [name] WJX:[id number] DOB: 12-01-1955 DOA: 11/20/2022  PCP: [name], DO  Admit date: 11/20/2022 Discharge date: 11/24/2022  Admitted From: Home Disposition: Home  Recommendations for Outpatient Follow-up:  Follow up with PCP in 1-2 weeks Continue MiraLAX once daily Recommend outpatient referral to neuropsych for cognitive testing, suspect underlying cognitive impairment versus early dementia If needs recurrent hospitalization for small bowel obstruction, recommend admission to [facility] as her surgeons are familiar with her care at their facility  Home Health: No Equipment/Devices: None  Discharge Condition: Stable CODE STATUS: Full code Diet recommendation: Soft diet  History of present illness:  [name] is a 67 y.o. female with past medical history significant for prior complicated abdominal surgeries including hemicolectomy, cervical cancer s/p hysterectomy, cholecystectomy, exploratory laparoscopy as well as prior pelvic radiation recently hospitalized 6/2 through 6/4 for small bowel obstruction who presents within 24 hours of discharge with recurrence of abdominal pain associate with nausea/vomiting.  Was followed by general surgery during her most recent hospitalization and improved quickly with conservative management with toleration of diet and multiple bowel movements and was discharged home with recommendation of outpatient follow-up; but unfortunately she had a recurrence of symptoms once again.   In the ED, temperature 98.0 F, HR 65, RR 16, BP 110/72, SpO2 100% on room air.  WBC 5.4, hemoglobin 11.0, platelets 126.  Sodium 142, potassium 4.0, chloride 107, CO2 24, glucose 157, BUN 15, creatinine 1.32.  Lipase 23.  AST 15, ALT 14, total bilirubin 0.3.  Urinalysis with large leukocytes, negative nitrite, few bacteria, greater than 50 WBCs.  CT abdomen/pelvis with interval decrease in degree of proximal small  bowel dilation but with moderate dilation of proximal small bowel loops with decompression of distal small bowel loops suggesting partial small bowel obstruction due to adhesions, intrahepatic bile ducts more prominent likely related to previous cholecystectomy, chronic changes in pelvis likely from previous surgery/radiation.  Hospital course:  Small bowel obstruction, recurrent Patient representing to the ED with recurrence of abdominal pain associate with nausea and vomiting.  Recently discharged home on 6/4 following recurrent small bowel obstruction.  Patient is afebrile without leukocytosis.  Lab base within normal limits.  Repeat CT abdomen/pelvis noted decrease in degree of proximal small bowel dilation but with moderate dilation that persists consistent with partial small bowel obstruction.  General surgery was consulted and NG tube was placed to low wall intermittent suction.  Tube was clamped with toleration and subsequently removed.  Patient's diet was advanced to soft with toleration and general surgery signed off.  Recommend continue soft diet and discharged with MiraLAX daily.  If has recurrence of small bowel obstruction recommend admission to [facility] as surgeons familiar with patient's care available per recommendations from our general surgeons.   Left upper extremity edema likely secondary to IV fluid infiltration Patient with negative edema to left upper arm, suspicion for IV infiltration.  Vascular duplex ultrasound left upper extremity negative for DVT or SVT.   Abnormal urinalysis Urinalysis on admission with positive leukocytes, negative nitrite, rare bacteria, and greater than 50 WBCs.  Did receive one-time dose of ceftriaxone in the ED.  Patient asymptomatic, denies dysuria, increased urinary frequency.  Urine culture with multiple species.    CKD stage IIIb Baseline creatinine 1.1-1.2.  Creatinine 1.32 on admission.  Started on IV fluids;  creatinine to 0.95 at time of discharge.    Hypothyroidism Levothyroxine 25 mcg p.o. daily   Hyperlipidemia  Continue simvastatin.   Concern for cognitive impairment Patient with intermittent confusion during hospitalization.  Although she is alert and oriented.  Recommend outpatient follow-up with PCP, consideration of neurocognitive testing with neurology versus psychiatry outpatient.    Discharge Diagnoses:  Principal Problem:   SBO (small bowel obstruction) (HCC) Active Problems:   Hypothyroidism   Anxiety   Essential tremor   Constipation   CKD (chronic kidney disease) stage 3, GFR 30-59 ml/min (HCC)   Subacute confusional state    Discharge Instructions  Discharge Instructions     Call MD for:  difficulty breathing, headache or visual disturbances   Complete by: As directed    Call MD for:  extreme fatigue   Complete by: As directed    Call MD for:  persistant dizziness or light-headedness   Complete by: As directed    Call MD for:  persistant nausea and vomiting   Complete by: As directed    Call MD for:  severe uncontrolled pain   Complete by: As directed    Call MD for:  temperature >100.4   Complete by: As directed    Diet - low sodium heart healthy   Complete by: As directed    Increase activity slowly   Complete by: As directed       Allergies as of 11/24/2022       Reactions   Barium-containing Compounds    hives   Percocet [oxycodone-acetaminophen] Other (See Comments)   migraine         Medication List     TAKE these medications    cetirizine 10 MG tablet Commonly known as: ZyrTEC Allergy Take 1 tablet (10 mg total) by mouth daily. What changed:  when to take this reasons to take this   fluticasone 50 MCG/ACT nasal spray Commonly known as: FLONASE Place 2 sprays into both nostrils daily for 14 days.   levothyroxine 25 MCG tablet Commonly known as: SYNTHROID Take 1 tablet (25 mcg total) by mouth daily.   multivitamin with  minerals Tabs tablet Take 1 tablet by mouth daily.   polyethylene glycol 17 g packet Commonly known as: MIRALAX / GLYCOLAX Take 17 g by mouth daily.   simvastatin 20 MG tablet Commonly known as: ZOCOR Take 1 tablet (20 mg total) by mouth daily at 6 PM.        Follow-up Information     Delynn Flavin M, DO. Schedule an appointment as soon as possible for a visit in 1 week(s).   Specialty: Family Medicine Contact information: [address] [place] [address] [phone]                Allergies  Allergen Reactions   Barium-Containing Compounds     hives   Percocet [Oxycodone-Acetaminophen] Other (See Comments)    migraine     Consultations: General surgery   Procedures/Studies: DG Abd Portable 1V-Small Bowel Obstruction Protocol-24 hr delay  Result Date: 11/22/2022 CLINICAL DATA:  Small-bowel obstruction. EXAM: PORTABLE ABDOMEN - 1 VIEW COMPARISON:  11/21/2022 FINDINGS: Nasogastric tube extends into the distal stomach. No further dilated small bowel visualized. There is some dilute oral contrast in the colon. This is further dilute in appearance compared to yesterday's film. IMPRESSION: Nasogastric tube extends into the distal stomach. No further dilated small bowel visualized. Electronically Signed   By: Irish Lack M.D.   On: 11/22/2022 10:50   VAS [place] UPPER EXTREMITY VENOUS DUPLEX  Result Date: 11/21/2022 UPPER VENOUS STUDY  Patient Name:  Tayllor F  Estock  Date of Exam:   11/21/2022 Medical Rec #: [id number]      Accession #:    [id number] Date of Birth: 1955-07-28     Patient Gender: F Patient Age:   24 years Exam Location:  [facility] Procedure:      VAS [place] UPPER EXTREMITY VENOUS DUPLEX Referring Phys: Oluwadarasimi Favor [name] --------------------------------------------------------------------------------  Indications: Swelling Limitations: Body habitus and poor ultrasound/tissue interface. Comparison Study: No prior study Performing Technologist: Sherren Kerns  RVS  Examination Guidelines: A complete evaluation includes B-mode imaging, spectral Doppler, color Doppler, and power Doppler as needed of all accessible portions of each vessel. Bilateral testing is considered an integral part of a complete examination. Limited examinations for reoccurring indications may be performed as noted.  Right Findings: +----------+------------+---------+-----------+----------+-------+ RIGHT     CompressiblePhasicitySpontaneousPropertiesSummary +----------+------------+---------+-----------+----------+-------+ Subclavian               Yes       Yes                      +----------+------------+---------+-----------+----------+-------+  Left Findings: +----------+------------+---------+-----------+----------+---------------------+ LEFT      CompressiblePhasicitySpontaneousProperties       Summary        +----------+------------+---------+-----------+----------+---------------------+ IJV           Full       Yes       Yes                                    +----------+------------+---------+-----------+----------+---------------------+ Subclavian    Full       Yes       Yes                                    +----------+------------+---------+-----------+----------+---------------------+ Axillary                 Yes       Yes               patent by color and                                                             Doppler        +----------+------------+---------+-----------+----------+---------------------+ Brachial                                             patent by color and                                                             Doppler        +----------+------------+---------+-----------+----------+---------------------+ Radial        Full                                                        +----------+------------+---------+-----------+----------+---------------------+  Ulnar         Full                                                         +----------+------------+---------+-----------+----------+---------------------+ Cephalic      Full                                                        +----------+------------+---------+-----------+----------+---------------------+ Basilic       Full                                                        +----------+------------+---------+-----------+----------+---------------------+  Summary:  Right: No evidence of deep vein thrombosis in the upper extremity. No evidence of superficial vein thrombosis in the upper extremity.  Left: No evidence of thrombosis in the subclavian.  *See table(s) above for measurements and observations.  Diagnosing physician: Sherald Hess MD Electronically signed by Sherald Hess MD on 11/21/2022 at 1:41:35 PM.    Final    DG Abd Portable 1V-Small Bowel Obstruction Protocol-initial, 8 hr delay  Result Date: 11/21/2022 CLINICAL DATA:  Small-bowel obstruction protocol. EXAM: PORTABLE ABDOMEN - 1 VIEW COMPARISON:  11/20/2022 at 8:43 p.m.  CT, 11/20/2022 at 4 p.m. FINDINGS: Contrast has extended into the colon. Dilated, partly air-filled loops of small bowel persist in the central abdomen. Nasogastric tube tip lies in the distal stomach. IMPRESSION: 1. Persistent dilated small bowel in the central abdomen, although contrast has now extended from the small bowel into the colon. Findings support a low-grade partial obstruction versus a mild small bowel adynamic ileus Electronically Signed   By: Amie Portland M.D.   On: 11/21/2022 08:09   DG Abd Portable 1V  Result Date: 11/20/2022 CLINICAL DATA:  Check gastric catheter placement EXAM: PORTABLE ABDOMEN - 1 VIEW COMPARISON:  11/18/22 FINDINGS: Gastric catheter is noted within the stomach. No free air is noted. Contrast is seen within the collecting systems related to prior CT examination. Hydronephrotic changes are noted on the left similar to that seen on the prior  exam. No bony abnormality is noted. IMPRESSION: Gastric catheter within the distal stomach. Electronically Signed   By: Alcide Clever M.D.   On: 11/20/2022 20:52   CT ABDOMEN PELVIS W CONTRAST  Result Date: 11/20/2022 CLINICAL DATA:  Bowel obstruction suspected EXAM: CT ABDOMEN AND PELVIS WITH CONTRAST TECHNIQUE: Multidetector CT imaging of the abdomen and pelvis was performed using the standard protocol following bolus administration of intravenous contrast. RADIATION DOSE REDUCTION: This exam was performed according to the departmental dose-optimization program which includes automated exposure control, adjustment of the mA and/or kV according to patient size and/or use of iterative reconstruction technique. CONTRAST:  80mL OMNIPAQUE IOHEXOL 300 MG/ML  SOLN COMPARISON:  Previous studies including the examination of 11/17/2022 FINDINGS: Lower chest: Visualized lower lung fields are clear. Hepatobiliary: Surgical clips are seen in gallbladder fossa. Intrahepatic bile ducts appear more prominent in comparison with the previous study. Distal common bile duct in the head of  the pancreas measures 8 mm. Pancreas: There is atrophy.  No focal abnormalities are seen. Spleen: Unremarkable. Adrenals/Urinary Tract: There is 2.1 cm nodule in left adrenal which appears stable, possibly adenoma. There is a 9 mm nodule in the right adrenal which appears stable. Left kidneys much smaller than right. There are foci of parenchymal thinning at multiple sites in the left kidney suggesting scarring. There is no significant hydronephrosis. There is prominence of left renal pelvis without significant dilation of minor calyces. There are no renal or ureteral stones. Urinary bladder is not distended. Stomach/Bowel: Small hiatal hernia is seen. Stomach is not distended. There is dilation of few proximal small bowel loops measuring up to 3.3 cm in diameter. There is interval decrease in degree of proximal small bowel dilation. Distal  small-bowel loops are decompressed. Zone of transition appears to be in left side of pelvis. Gas and stool are present in the lumen of colon. There is wall thickening in rectum which appears to be chronic. Minimal ascites is present adjacent to the proximal descending colon. There is no loculated pericolic fluid collection. Vascular/Lymphatic: Scattered arterial calcifications are seen. Reproductive: Uterus is not seen. Surgical clips are seen in pelvis. There are coarse calcifications in both sides of pelvis. Prominence of perirectal soft tissues has not changed. These findings may be residual from previous intervention such as surgery and radiation. Other: There is no pneumoperitoneum. Minimal amount of ascites is noted in left mid abdomen adjacent to the descending colon. Musculoskeletal: No acute findings are seen. IMPRESSION: There is interval decrease in degree of proximal small bowel dilation. Still, there is moderate dilation of proximal small bowel loops with decompression of distal small bowel loops suggesting partial small bowel obstruction due to adhesions. There is no hydronephrosis. There is atrophy and cortical thinning in the left kidney with no interval change. Nodules are seen in both adrenals with no interval change. Intrahepatic bile ducts appear more prominent. This may be related to previous cholecystectomy. Less likely possibility would be stricture in the distal common bile duct. Chronic changes in the pelvis may be due to previous surgery and possibly radiation treatment. Small hiatal hernia. Aortic atherosclerosis. Electronically Signed   By: Ernie Avena M.D.   On: 11/20/2022 16:43   DG Abd Portable 1V  Result Date: 11/18/2022 CLINICAL DATA:  Small-bowel obstruction EXAM: PORTABLE ABDOMEN - 1 VIEW COMPARISON:  Previous studies including the CT done on 12/10/22 FINDINGS: There is dilation of small-bowel loops in left upper abdomen measuring up to 4 cm in diameter. Bowel gas  pattern is otherwise unremarkable. Stomach is not distended. There is contrast in the urinary bladder from previous CT. Visualized lower lung fields are clear. Surgical clips are seen in right upper quadrant. IMPRESSION: There is dilation of proximal small bowel loops suggesting partial small bowel obstruction. Electronically Signed   By: Ernie Avena M.D.   On: 11/18/2022 09:38   CT ABDOMEN PELVIS W CONTRAST  Result Date: 2022/12/10 CLINICAL DATA:  Abdominal pain.  Nausea vomiting. EXAM: CT ABDOMEN AND PELVIS WITH CONTRAST TECHNIQUE: Multidetector CT imaging of the abdomen and pelvis was performed using the standard protocol following bolus administration of intravenous contrast. RADIATION DOSE REDUCTION: This exam was performed according to the departmental dose-optimization program which includes automated exposure control, adjustment of the mA and/or kV according to patient size and/or use of iterative reconstruction technique. CONTRAST:  OMNIPAQUE IOHEXOL 300 MG/ML  SOLN COMPARISON:  09/11/2022. FINDINGS: Lower chest: Clear lung bases. Hepatobiliary: No focal liver  abnormality is seen. Status post cholecystectomy. No biliary dilatation. Pancreas: Atrophy.  No mass or inflammation. Spleen: Normal in size without focal abnormality. Adrenals/Urinary Tract: Stable 2.2 cm left adrenal mass, measuring -3 Hounsfield units on the previous unenhanced CT, consistent with an adenoma. No follow-up recommended. Normal right adrenal gland. Marked left renal atrophy. Left hydronephrosis noted on the prior CT is no longer visualized. Normal right kidney. Ureters normal in course and in caliber. Bladder unremarkable. Stomach/Bowel: Dilated proximal small bowel to a maximum of 3.8 cm. There is a transition point in the left pelvis, with decompressed small bowel distal to this. A small bowel anastomosis staple line is noted along the distal ileum in the right pelvis. Stomach mildly distended with fluid. No wall  thickening or inflammation. No small bowel wall thickening. Colon is normal in caliber. No colonic wall thickening or convincing inflammation. Vascular/Lymphatic: Aortic atherosclerosis. No aneurysm. No enlarged lymph nodes. Reproductive: Status post hysterectomy. No adnexal masses. Extensive pelvic scarring with inferior pelvic sidewall calcifications and surgical vascular clips related to treatment of cervical carcinoma, stable from the prior exam. Other: No hernia or ascites. Musculoskeletal: No fracture or acute finding. No osteoblastic or osteolytic lesions. IMPRESSION: 1. Partial small bowel obstruction, transition point in the left pelvis suspected to be due to adhesions. 2. No other acute abnormality within the abdomen or pelvis. 3. Marked left renal atrophy, stable. Hydronephrosis noted on the previous CT has resolved. 4. Stable chronic pelvic changes from treatment of cervical carcinoma. 5. Aortic atherosclerosis. Electronically Signed   By: Amie Portland M.D.   On: 11/17/2022 17:27     Subjective: Patient seen examined bedside, resting comfortably.  Lying in bed.  Spouse present.  Discussed with general surgery, Dr. Michaell Cowing this morning okay for discharge from their standpoint.  Patient ready for discharge.  Tolerating diet with bowel movement.  Discussed need to continue soft diet with easily digestible foods, avoid raw vegetables/roughage.  Instructed to utilize MiraLAX daily.  No other questions or concerns at this time.  Denies headache, no dizziness, no chest pain, no shortness of breath, no abdominal pain, no fever/chills/night sweats, no nausea/vomiting/diarrhea, no fatigue, no paresthesias, no focal weakness.  Discharge Exam: Vitals:   11/24/22 0311 11/24/22 0607  BP: (!) 124/90 131/72  Pulse: 75 64  Resp: 18 18  Temp: 97.8 F (36.6 C) 98.5 F (36.9 C)  SpO2: 100% 100%   Vitals:   11/23/22 2000 11/23/22 2109 11/24/22 0311 11/24/22 0607  BP: 127/82 121/76 (!) 124/90 131/72  Pulse:  99 84 75 64  Resp: 17 18 18 18   Temp: (!) 97.3 F (36.3 C) 98.2 F (36.8 C) 97.8 F (36.6 C) 98.5 F (36.9 C)  TempSrc: Oral Oral Axillary Axillary  SpO2: 98% 99% 100% 100%  Weight:      Height:        Physical Exam: GEN: NAD, alert and oriented x 3, chronically ill in appearance HEENT: NCAT, PERRL, EOMI, sclera clear, MMM PULM: CTAB w/o wheezes/crackles, normal respiratory effort, on room air CV: RRR w/o M/G/R GI: abd soft, NTND, NABS, no R/G/M MSK: no peripheral edema, muscle strength globally intact 5/5 bilateral upper/lower extremities NEURO: CN II-XII intact, no focal deficits, sensation to light touch intact PSYCH: normal mood/affect Integumentary: dry/intact, no rashes or wounds    The results of significant diagnostics from this hospitalization (including imaging, microbiology, ancillary and laboratory) are listed below for reference.     Microbiology: Recent Results (from the past 240 hour(s))  Urine Culture  Status: Abnormal   Collection Time: 11/20/22  3:54 PM   Specimen: Urine, Clean Catch  Result Value Ref Range Status   Specimen Description   Final    URINE, CLEAN CATCH Performed at [facility], [address], [place], [place] [address]    Special Requests   Final    NONE Performed at [facility], [address] [place], [place] [address]    Culture MULTIPLE SPECIES PRESENT, SUGGEST RECOLLECTION (A)  Final   Report Status 11/21/2022 FINAL  Final     Labs: BNP (last 3 results) No results for input(s): "BNP" in the last 8760 hours. Basic Metabolic Panel: Recent Labs  Lab 11/18/22 0433 11/19/22 0451 11/20/22 1430 11/21/22 0451 11/22/22 0520  NA 137 141 142 141 140  K 3.7 3.3* 4.0 3.5 3.5  CL 108 109 107 108 103  CO2 20* 26 24 25 26   GLUCOSE 72 122* 157* 83 77  BUN 32* 20 15 14 10   CREATININE 1.23* 1.08* 1.32* 1.15* 0.95  CALCIUM 8.3* 8.4* 9.2 8.3* 8.8*  MG  --  1.8  --  1.9 1.8  PHOS  --   --    --  3.2  --    Liver Function Tests: Recent Labs  Lab 11/17/22 1000 11/18/22 0433 11/19/22 0451 11/20/22 1430  AST 15 11* 13* 15  ALT 13 10 11 14   ALKPHOS 79 53 52 62  BILITOT 1.3* 0.9 0.2* 0.3  PROT 7.7 5.6* 5.6* 6.6  ALBUMIN 4.4 3.4* 3.1* 3.9   Recent Labs  Lab 11/17/22 1000 11/20/22 1430  LIPASE 22 23   No results for input(s): "AMMONIA" in the last 168 hours. CBC: Recent Labs  Lab 11/17/22 1000 11/18/22 0433 11/19/22 0451 11/20/22 1630 11/21/22 0451  WBC 6.2 5.1 3.6* 5.4 5.0  NEUTROABS 4.9  --  2.0 4.7  --   HGB 14.4 11.7* 11.1* 11.0* 10.8*  HCT 41.0 34.5* 33.1* 32.5* 31.5*  MCV 91.5 96.4 94.0 95.3 94.9  PLT 169 133* 117* 126* 126*   Cardiac Enzymes: No results for input(s): "CKTOTAL", "CKMB", "CKMBINDEX", "TROPONINI" in the last 168 hours. BNP: Invalid input(s): "POCBNP" CBG: No results for input(s): "GLUCAP" in the last 168 hours. D-Dimer No results for input(s): "DDIMER" in the last 72 hours. Hgb A1c No results for input(s): "HGBA1C" in the last 72 hours. Lipid Profile No results for input(s): "CHOL", "HDL", "LDLCALC", "TRIG", "CHOLHDL", "LDLDIRECT" in the last 72 hours. Thyroid function studies No results for input(s): "TSH", "T4TOTAL", "T3FREE", "THYROIDAB" in the last 72 hours.  Invalid input(s): "FREET3" Anemia work up No results for input(s): "VITAMINB12", "FOLATE", "FERRITIN", "TIBC", "IRON", "RETICCTPCT" in the last 72 hours. Urinalysis    Component Value Date/Time   COLORURINE YELLOW 11/20/2022 1554   APPEARANCEUR HAZY (A) 11/20/2022 1554   APPEARANCEUR Clear 10/16/2022 0813   LABSPEC 1.024 11/20/2022 1554   PHURINE 5.0 11/20/2022 1554   GLUCOSEU NEGATIVE 11/20/2022 1554   HGBUR NEGATIVE 11/20/2022 1554   BILIRUBINUR NEGATIVE 11/20/2022 1554   BILIRUBINUR Negative 10/16/2022 0813   KETONESUR NEGATIVE 11/20/2022 1554   PROTEINUR 30 (A) 11/20/2022 1554   UROBILINOGEN 0.2 10/11/2022 0848   UROBILINOGEN 0.2 03/04/2014 1118   NITRITE  NEGATIVE 11/20/2022 1554   LEUKOCYTESUR LARGE (A) 11/20/2022 1554   Sepsis Labs Recent Labs  Lab 11/18/22 0433 11/19/22 0451 11/20/22 1630 11/21/22 0451  WBC 5.1 3.6* 5.4 5.0   Microbiology Recent Results (from the past 240 hour(s))  Urine Culture  Status: Abnormal   Collection Time: 11/20/22  3:54 PM   Specimen: Urine, Clean Catch  Result Value Ref Range Status   Specimen Description   Final    URINE, CLEAN CATCH Performed at [facility], [address] [place], [place] [address]    Special Requests   Final    NONE Performed at [facility], [address] [place], [place] [address]    Culture MULTIPLE SPECIES PRESENT, SUGGEST RECOLLECTION (A)  Final   Report Status 11/21/2022 FINAL  Final     Time coordinating discharge: Over 30 minutes  SIGNED:   Alvira Philips [name], DO  [facility] 11/24/2022, 9:33 AM

## 2022-11-24 NOTE — Plan of Care (Signed)

## 2022-11-24 NOTE — Progress Notes (Signed)
Reviewed written d/c instructions w pt and her husband and all questions answered. They both verbalized understanding. D/C via w/c w all belongings in stable condition. 

## 2022-11-24 NOTE — Progress Notes (Signed)
11/24/2022  MERRIAM BRANDNER [id number] Jan 20, 1956  CARE TEAM: PCP: [name], DO  Outpatient Care Team: Patient Care Team: [name], DO as PCP - General (Family Medicine) [name], RN as [facility] Care Management [name], MD as Referring Physician (General Surgery)  Inpatient Treatment Team: Treatment Team: Attending Provider: [name], [name], DO; Consulting Physician: [name], MD; Rounding Team: [name], MD; Registered Nurse: [name], RN   Problem List:   Principal Problem:   SBO (small bowel obstruction) (HCC) Active Problems:   CKD (chronic kidney disease) stage 3, GFR 30-59 ml/min (HCC)   * No surgery found *      Assessment [facility] Stay = 4 days)      Partial small bowel obstruction in the setting of prior pelvic radiation and numerous surgeries - resolved nonoperatively.    Plan:  -Tolerating soft diet.  I think she can gradually transition to a high-fiber diet as tolerated with low threshold to avoid high roughage foods.  I strongly recommend that she stay on MiraLAX every day to keep from having future blockages other concerns.  I also recommended if she feels like she has another blockage going on to transition to liquid/blenderized pourable pured diet for 48 hours and then readvanced.  She likes frosted mini wheats to keep her from getting blocked up.  I noted that was okay if she chewed it well and did not do high-volume.  She sort of perseverated and wanted to argue about what other doctors that said, annoyed about the other doctors advice.  Annoyed that she does not get the same recommendations from every doctor or on the website.  Husband worried that this could happen again and was hoping we could do something surgically to get rid of all the adhesions and keep this from happening again.  I caution with her prior radiation and surgeries for her prior I noted in the end all I can do is give my  advice.  She is not obstructed.  No need for surgery.  [facility] will sign off.  Can defer back to colorectal surgery at [facility] that have been managing her in the last several operations.  Concerns or mental confusion.  No focal deficits.  Vital signs are stable.  No major concerns from physical exam or laboratory values.  She clearly does seem to have some memory loss and not knowing whether she has eaten or if her husband was here yesterday.  .  Will defer to primary service.  -VTE prophylaxis:  enoxaparin, SCDs, etc -mobilize as tolerated to help recovery -ID: No evidence of infection = no antibiotics at this time -Disposition: Primary service. -Hopefully home tomorrow if continues to do well.     I reviewed nursing notes, hospitalist notes, last 24 h vitals and pain scores, last 48 h intake and output, last 24 h labs and trends, and last 24 h imaging results.  I have reviewed this patient's available data, including medical history, events of note, test results, etc as part of my evaluation.   A significant portion of that time was spent in counseling. Care during the described time interval was provided by me.  This care required moderate level of medical decision making.  11/24/2022    Subjective: (Chief complaint)  Patient talking phone with daughter.  Husband just outside room but came in.  Tolerating solid food.  Wanted to relay to get advice from other doctors and other recommendations.  Objective:  Vital signs:  Vitals:   11/23/22 2000 11/23/22 2109 11/24/22 0311 11/24/22 0607  BP: 127/82 121/76 (!) 124/90 131/72  Pulse: 99 84 75 64  Resp: 17 18 18 18   Temp: (!) 97.3 F (36.3 C) 98.2 F (36.8 C) 97.8 F (36.6 C) 98.5 F (36.9 C)  TempSrc: Oral Oral Axillary Axillary  SpO2: 98% 99% 100% 100%  Weight:      Height:        Last BM Date : 11/22/22  Intake/Output   Yesterday:  06/08 0701 - 06/09 0700 In: 740 [P.O.:720; I.V.:20] Out: 2  [Stool:2] This shift:  No intake/output data recorded.  Bowel function:  Flatus: YES  BM:  YES  Drain: (No drain)   Physical Exam:  General: Pt awake/alert in no acute distress.  Inquisitive and chatty  eyes: PERRL, normal EOM.  Sclera clear.  No icterus Neuro: CN II-XII intact w/o focal sensory/motor deficits.  Seems to have some short term memory issues, not remembering what she ate yesterday or that her husband was here yesterday. Lymph: No head/neck/groin lymphadenopathy Psych:  No delerium/psychosis/paranoia.  Oriented x 4 HENT: Normocephalic, Mucus membranes moist.  No thrush Neck: Supple, No tracheal deviation.  No obvious thyromegaly Chest: No pain to chest wall compression.  Good respiratory excursion.  No audible wheezing CV:  Pulses intact.  Regular rhythm.  No major extremity edema MS: Normal AROM mjr joints.  No obvious deformity  Abdomen: Soft.  Nondistended.  Nontender.  No evidence of peritonitis.  No incarcerated hernias.  Ext:   No deformity.  No mjr edema.  No cyanosis Skin: No petechiae / purpurea.  No major sores.  Warm and dry    Results:   Cultures: Recent Results (from the past 720 hour(s))  Urine Culture     Status: Abnormal   Collection Time: 11/20/22  3:54 PM   Specimen: Urine, Clean Catch  Result Value Ref Range Status   Specimen Description   Final    URINE, CLEAN CATCH Performed at [facility], [address] [place], [place] [address]    Special Requests   Final    NONE Performed at [facility], [address], [place], [place] [address]    Culture MULTIPLE SPECIES PRESENT, SUGGEST RECOLLECTION (A)  Final   Report Status 11/21/2022 FINAL  Final    Labs: No results found for this or any previous visit (from the past 48 hour(s)).   Imaging / Studies: No results found.  Medications / Allergies: per chart  Antibiotics: Anti-infectives (From admission, onward)    Start     Dose/Rate Route  Frequency Ordered Stop   11/20/22 1730  cefTRIAXone (ROCEPHIN) 1 g in sodium chloride 0.9 % 100 mL IVPB        1 g 200 mL/hr over 30 Minutes Intravenous  Once 11/20/22 1717 11/20/22 1905         Note: Portions of this report may have been transcribed using voice recognition software. Every effort was made to ensure accuracy; however, inadvertent computerized transcription errors may be present.   Any transcriptional errors that result from this process are unintentional.    [name], MD, FACS, MASCRS Esophageal, Gastrointestinal & Colorectal Surgery Robotic and Minimally Invasive Surgery  [facility] [address], [address] [place], [place] [address] [phone] Fax [phone] Main  CONTACT INFORMATION:  Weekday (9AM-5PM): Call [facility] main office at [phone]  Weeknight (5PM-9AM)  or Weekend/Holiday: Check [email or web address] (password " [email or web address]") for General Surgery [facility] coverage  (Please, do not use SecureChat as it is not reliable communication to reach operating surgeons for immediate patient care given surgeries/outpatient duties/clinic/cross-coverage/off post-call which would lead to a delay in care.  Epic staff messaging available for outptient concerns, but may not be answered for 48 hours or more).     11/24/2022  8:43 AM

## 2022-11-24 NOTE — Discharge Instructions (Signed)
EATING AFTER A SMALL BOWEL OBSTRUCTION   EAT START WITH PUREED OR SOFT FOODS Gradually transition to a high fiber diet with a fiber supplement over the next few days after discharge  WALK Walk an hour a day.  Control your pain to do that.    CONTROL PAIN Control pain so that you can walk, sleep, tolerate sneezing/coughing, go up/down stairs.  HAVE A BOWEL MOVEMENT DAILY Keep your bowels regular to avoid problems.  OK to try a laxative to override constipation.  OK to use an antidairrheal to slow down diarrhea.  Call if not better after 2 tries  CALL IF YOU HAVE PROBLEMS/CONCERNS Call if you are still struggling despite following these instructions. Call if you have concerns not answered by these instructions     After your attack of SMALL BOWEL OBSTRUCTION, expect some issues over the next few weeks.    To help you through this temporary phase, we start you out on a pureed (blenderized) diet.  Your first meal in the hospital was thin liquids.  You should have been given a pureed diet by the time you left the hospital.  We ask patients to stay on a pureed diet for the first few days to avoid anything getting "stuck."  Don't be alarmed if your ability to swallow doesn't progress according to this plan.  Everyone is different and some diets can advance more or less quickly.     Some BASIC RULES to follow are: Maintain an upright position whenever eating or drinking. Take small bites - just a teaspoon size bite at a time. Eat slowly.  It may also help to eat only one food at a time. Consider nibbling through smaller, more frequent meals & avoid the urge to eat BIG meals Do not push through feelings of fullness, nausea, or bloatedness Do not mix solid foods and liquids in the same mouthful Try not to "wash foods down" with large gulps of liquids. Avoid carbonated (bubbly/fizzy) drinks.   Avoid foods that make you feel gassy or bloated.  Start with bland foods first.  Wait on trying  greasy, fried, or spicy meals until you are tolerating more bland solids well. Expect to be more gassy/flatulent/bloated initially.  Walking will help your body manage it better. Consider using medications for bloating that contain simethicone such as  Maalox or Gas-X  Eat in a relaxed atmosphere & minimize distractions. Avoid talking while eating.   Do not use straws. Following each meal, sit in an upright position (90 degree angle) for 60 to 90 minutes.  Going for a short walk can help as well If food does stick, don't panic.  Try to relax and let the food pass on its own.  Sipping WARM LIQUID such as strong hot black tea can also help slide it down.   Be gradual in changes & use common sense:  -If you easily tolerating a certain "level" of foods, advance to the next level gradually -If you are having trouble swallowing a particular food, then avoid it.   -If food is sticking when you advance your diet, go back to thinner previous diet (the lower LEVEL) for 1-2 days.  LEVEL 1 = PUREED DIET  Do for the first Riley in this group are pureed or blenderized to a smooth, mashed potato-like consistency.  -If necessary, the pureed foods can keep their shape with the addition of a thickening agent.   -Meat should be pureed to a  smooth, pasty consistency.  Hot broth or gravy may be added to the pureed meat, approximately 1 oz. of liquid per 3 oz. serving of meat. -CAUTION:  If any foods do not puree into a smooth consistency, swallowing will be more difficult.  (For example, nuts or seeds sometimes do not blend well.)  Hot Foods Cold Foods  Pureed scrambled eggs and cheese Pureed cottage cheese  Baby cereals Thickened juices and nectars  Thinned cooked cereals (no lumps) Thickened milk or eggnog  Pureed [place] toast or pancakes Ensure  Mashed potatoes Ice cream  Pureed parsley, au gratin, scalloped potatoes, candied sweet potatoes Fruit or [place] ice,  sherbet  Pureed buttered or alfredo noodles Plain yogurt  Pureed vegetables (no corn or peas) Instant breakfast  Pureed soups and creamed soups Smooth pudding, mousse, custard  Pureed scalloped apples Whipped gelatin  Gravies Sugar, syrup, honey, jelly  Sauces, cheese, tomato, barbecue, white, creamed Cream  Any baby food Creamer  Alcohol in moderation (not beer or champagne) Margarine  Coffee or tea Mayonnaise   Ketchup, mustard   Apple sauce   SAMPLE MENU:  PUREED DIET Breakfast Lunch Dinner  Orange juice, 1/2 cup Cream of wheat, 1/2 cup Pineapple juice, 1/2 cup Pureed [place], barley soup, 3/4 cup Pureed Hawaiian chicken, 3 oz  Scrambled eggs, mashed or blended with cheese, 1/2 cup Tea or coffee, 1 cup  Whole milk, 1 cup  Non-dairy creamer, 2 Tbsp. Mashed potatoes, 1/2 cup Pureed cooled broccoli, 1/2 cup Apple sauce, 1/2 cup Coffee or tea Mashed potatoes, 1/2 cup Pureed spinach, 1/2 cup Frozen yogurt, 1/2 cup Tea or coffee      LEVEL 2 = SOFT DIET  After your first few days, you can advance to a soft, low residue diet.   Keep on this diet until everything goes down easily.  Hot Foods Cold Foods  White fish Cottage cheese  Stuffed fish Junior baby fruit  Baby food meals Semi thickened juices  Minced soft cooked, scrambled, poached eggs nectars  Souffle & omelets Ripe mashed bananas  Cooked cereals Canned fruit, pineapple sauce, milk  potatoes Milkshake  Buttered or Alfredo noodles Custard  Cooked cooled vegetable Puddings, including tapioca  Sherbet Yogurt  Vegetable soup or alphabet soup Fruit ice, [place] ice  Gravies Whipped gelatin  Sugar, syrup, honey, jelly Junior baby desserts  Sauces:  Cheese, creamed, barbecue, tomato, white Cream  Coffee or tea Margarine   SAMPLE MENU:  LEVEL 2 Breakfast Lunch Dinner  Orange juice, 1/2 cup Oatmeal, 1/2 cup Scrambled eggs with cheese, 1/2 cup Decaffeinated tea, 1 cup Whole milk, 1 cup Non-dairy creamer, 2 Tbsp  Pineapple juice, 1/2 cup Minced beef, 3 oz Gravy, 2 Tbsp Mashed potatoes, 1/2 cup Minced fresh broccoli, 1/2 cup Applesauce, 1/2 cup Coffee, 1 cup [place], barley soup, 3/4 cup Minced Hawaiian chicken, 3 oz Mashed potatoes, 1/2 cup Cooked spinach, 1/2 cup Frozen yogurt, 1/2 cup Non-dairy creamer, 2 Tbsp      LEVEL 3 = CHOPPED DIET  -After all the foods in level 2 (soft diet) are passing through well you should advance up to more chopped foods.  -It is still important to cut these foods into small pieces and eat slowly.  Hot Foods Cold Foods  Poultry Cottage cheese  Chopped Swedish meatballs Yogurt  Meat salads (ground or flaked meat) Milk  Flaked fish (tuna) Milkshakes  Poached or scrambled eggs Soft, cold, dry cereal  Souffles and omelets Fruit juices or nectars  Cooked cereals Chopped canned  fruit  Chopped [place] toast or pancakes Canned fruit cocktail  Noodles or pasta (no rice) Pudding, mousse, custard  Cooked vegetables (no frozen peas, corn, or mixed vegetables) Green salad  Canned small sweet peas Ice cream  Creamed soup or vegetable soup Fruit ice, [place] ice  Pureed vegetable soup or alphabet soup Non-dairy creamer  Ground scalloped apples Margarine  Gravies Mayonnaise  Sauces:  Cheese, creamed, barbecue, tomato, white Ketchup  Coffee or tea Mustard   SAMPLE MENU:  LEVEL 3 Breakfast Lunch Dinner  Orange juice, 1/2 cup Oatmeal, 1/2 cup Scrambled eggs with cheese, 1/2 cup Decaffeinated tea, 1 cup Whole milk, 1 cup Non-dairy creamer, 2 Tbsp Ketchup, 1 Tbsp Margarine, 1 tsp Salt, 1/4 tsp Sugar, 2 tsp Pineapple juice, 1/2 cup Ground beef, 3 oz Gravy, 2 Tbsp Mashed potatoes, 1/2 cup Cooked spinach, 1/2 cup Applesauce, 1/2 cup Decaffeinated coffee Whole milk Non-dairy creamer, 2 Tbsp Margarine, 1 tsp Salt, 1/4 tsp Pureed [place], barley soup, 3/4 cup Barbecue chicken, 3 oz Mashed potatoes, 1/2 cup Ground fresh broccoli, 1/2 cup Frozen yogurt, 1/2  cup Decaffeinated tea, 1 cup Non-dairy creamer, 2 Tbsp Margarine, 1 tsp Salt, 1/4 tsp Sugar, 1 tsp    LEVEL 4:  HIGH FIBER DIET / REGULAR FOODS  -Foods in this group are soft, moist, regularly textured foods.   -This level includes meat and breads, which tend to be the hardest things to swallow.   -Eat very slowly, chew well and continue to avoid carbonated drinks. -most people are at this level in 2-4 weeks  Hot Foods Cold Foods  Baked fish or skinned Soft cheeses - cottage cheese  Souffles and omelets Cream cheese  Eggs Yogurt  Stuffed shells Milk  Spaghetti with meat sauce Milkshakes  Cooked cereal Cold dry cereals (no nuts, dried fruit, coconut)  [place] toast or pancakes Crackers  Buttered toast Fruit juices or nectars  Noodles or pasta (no rice) Canned fruit  Potatoes (all types) Ripe bananas  Soft, cooked vegetables (no corn, lima, or baked beans) Peeled, ripe, fresh fruit  Creamed soups or vegetable soup Cakes (no nuts, dried fruit, coconut)  Canned chicken noodle soup Plain doughnuts  Gravies Ice cream  Bacon dressing Pudding, mousse, custard  Sauces:  Cheese, creamed, barbecue, tomato, white Fruit ice, [place] ice, sherbet  Decaffeinated tea or coffee Whipped gelatin  Pork chops Regular gelatin   Canned fruited gelatin molds   Sugar, syrup, honey, jam, jelly   Cream   Non-dairy   Margarine   Oil   Mayonnaise   Ketchup   Mustard   TROUBLESHOOTING IRREGULAR BOWELS  1) Avoid extremes of bowel movements (no bad constipation/diarrhea)  2) Miralax 17gm mixed in 8oz. water or juice-daily. May use BID as needed.  3) Gas-x,Phazyme, etc. as needed for gas & bloating.  4) Soft,bland diet. No spicy,greasy,fried foods.  5) Prilosec over-the-counter as needed  6) May hold gluten/wheat products from diet to see if symptoms improve.  7) May try probiotics (Align, Activa, etc) to help calm the bowels down  7) If symptoms become worse call back immediately.    If you  have any questions please call our office at [place]: [phone].     This information is not intended to replace advice given to you by your health care provider. Make sure you discuss any questions you have with your health care provider.      Bowel Obstruction A bowel obstruction is a blockage in the small or large bowel. The  bowel, which is also called the intestine, is a long, slender tube that connects the stomach to the anus. When a person eats and drinks, food and fluids go from the mouth to the stomach to the small bowel. This is where most of the nutrients in the food and fluids are absorbed. After the small bowel, material passes through the large bowel for further absorption until any leftover material leaves the body as stool through the anus during a bowel movement. A bowel obstruction will prevent food and fluids from passing through the bowel as they normally do during digestion. The bowel can become partially or completely blocked. If this condition is not treated, it can be dangerous because the bowel could rupture. What are the causes? Common causes of this condition include: Scar tissue (adhesions) from previous surgery or treatment with high-energy X-rays (radiation). Recent surgery. This may cause the movements of the bowel to slow down and cause food to block the intestine. Inflammatory bowel disease, such as Crohn's disease or diverticulitis. Growths or tumors. A bulging organ (hernia). Twisting of the bowel (volvulus). A foreign body. Slipping of a part of the bowel into another part (intussusception). What are the signs or symptoms? Symptoms of this condition include: Pain in the abdomen. Depending on the degree of obstruction, pain may be: Mild or severe. Dull cramping or sharp pain. In one area or in the entire abdomen. Nausea and vomiting. Vomit may be greenish or a yellow bile color. Bloating in the abdomen. Difficulty passing stool  (constipation). Lack of passing gas. Frequent belching. Diarrhea. This may occur if the obstruction is partial and runny stool is able to leak around the obstruction. How is this diagnosed? This condition may be diagnosed based on: A physical exam. Medical history. Imaging tests of the abdomen or pelvis, such as X-ray or CT scan. Blood or urine tests. How is this treated? Treatment for this condition depends on the cause and severity of the problem. Treatment may include: Fluids and pain medicines that are given through an IV. Your health care provider may instruct you not to eat or drink if you have nausea or vomiting. Eating a simple diet. You may be asked to consume a clear liquid diet for several days. This allows the bowel to rest. Placement of a small tube (nasogastric tube) into the stomach. This will relieve pain, discomfort, and nausea by removing blocked air and fluids from the stomach. It can also help the obstruction clear up faster. Surgery. This may be required if other treatments do not work. Surgery may be required for: Bowel obstruction from a hernia. This can be an emergency procedure. Scar tissue that causes frequent or severe obstructions. Follow these instructions at home: Medicines Take over-the-counter and prescription medicines only as told by your health care provider. If you were prescribed an antibiotic medicine, take it as told by your health care provider. Do not stop taking the antibiotic even if you start to feel better. General instructions Follow instructions from your health care provider about eating restrictions. You may need to avoid solid foods and consume only clear liquids until your condition improves. Return to your normal activities as told by your health care provider. Ask your health care provider what activities are safe for you. Avoid sitting for a long time without moving. Get up to take short walks every 1-2 hours. This is important to improve  blood flow and breathing. Ask for help if you feel weak or unsteady. Keep all follow-up visits  as told by your health care provider. This is important. How is this prevented? After having a bowel obstruction, you are more likely to have another. You may do the following things to prevent another obstruction: If you have a long-term (chronic) disease, pay attention to your symptoms and contact your health care provider if you have questions or concerns. Avoid becoming constipated. To prevent or treat constipation, your health care provider may recommend that you: Drink enough fluid to keep your urine pale yellow. Take over-the-counter or prescription medicines. Eat foods that are high in fiber, such as beans, whole grains, and fresh fruits and vegetables. Limit foods that are high in fat and processed sugars, such as fried or sweet foods. Stay active. Exercise for 30 minutes or more, 5 or more days each week. Ask your health care provider which exercises are safe for you. Avoid stress. Find ways to reduce stress, such as meditation, exercise, or taking time for activities that relax you. Instead of eating three large meals each day, eat three small meals with three small snacks. Work with a Data processing manager to make a healthy meal plan that works for you. Do not use any products that contain nicotine or tobacco, such as cigarettes and e-cigarettes. If you need help quitting, ask your health care provider. Contact a health care provider if you: Have a fever. Have chills. Get help right away if you: Have increased pain or cramping. Vomit blood. Have uncontrolled vomiting or nausea. Cannot drink fluids because of vomiting or pain. Become confused. Begin feeling very thirsty (dehydrated). Have severe bloating. Feel extremely weak or you faint. Summary A bowel obstruction is a blockage in the small or large bowel. A bowel obstruction will prevent food and fluids from passing through the bowel as they  normally do during digestion. Treatment for this condition depends on the cause and severity of the problem. It may include fluids and pain medicines through an IV, a simple diet, a nasogastric tube, or surgery. Follow instructions from your health care provider about eating restrictions. You may need to avoid solid foods and consume only clear liquids until your condition improves.     GETTING TO GOOD BOWEL HEALTH.  ######################################################################  EAT Gradually transition to a high fiber diet with a fiber supplement over the next few weeks after discharge.  Start with a pureed / full liquid diet (see below)  WALK Walk an hour a day.  Control your pain to do that.    HAVE A BOWEL MOVEMENT DAILY Keep your bowels regular to avoid problems.  OK to try a laxative to override constipation.  OK to use an antidairrheal to slow down diarrhea.  Call if not better after 2 tries  CALL IF YOU HAVE PROBLEMS/CONCERNS Call if you are still struggling despite following these instructions. Call if you have concerns not answered by these instructions  ######################################################################   Irregular bowel habits such as constipation and diarrhea can lead to many problems over time.  Having one soft bowel movement a day is the most important way to prevent further problems.  The anorectal canal is designed to handle stretching and feces to safely manage our ability to get rid of solid waste (feces, poop, stool) out of our body.  BUT, hard constipated stools can act like ripping concrete bricks and diarrhea can be a burning fire to this very sensitive area of our body, causing inflamed hemorrhoids, anal fissures, increasing risk is perirectal abscesses, abdominal pain/bloating, an making irritable bowel worse.  The goal: ONE SOFT BOWEL MOVEMENT A DAY!  To have soft, regular bowel movements:  Drink plenty of fluids, consider 4-6  tall glasses of water a day.   Take plenty of fiber.  Fiber is the undigested part of plant food that passes into the colon, acting s "natures broom" to encourage bowel motility and movement.  Fiber can absorb and hold large amounts of water. This results in a larger, bulkier stool, which is soft and easier to pass. Work gradually over several weeks up to 6 servings a day of fiber (25g a day even more if needed) in the form of: Vegetables -- Root (potatoes, carrots, turnips), leafy green (lettuce, salad greens, celery, spinach), or cooked high residue (cabbage, broccoli, etc) Fruit -- Fresh (unpeeled skin & pulp), Dried (prunes, apricots, cherries, etc ),  or stewed ( applesauce)  Whole grain breads, pasta, etc (whole wheat)  Bran cereals  Bulking Agents -- This type of water-retaining fiber generally is easily obtained each day by one of the following:  Psyllium bran -- The psyllium plant is remarkable because its ground seeds can retain so much water. This product is available as Metamucil, Konsyl, Effersyllium, Per Diem Fiber, or the less expensive generic preparation in drug and health food stores. Although labeled a laxative, it really is not a laxative.  Methylcellulose -- This is another fiber derived from wood which also retains water. It is available as Citrucel. Polyethylene Glycol - and "artificial" fiber commonly called Miralax or Glycolax.  It is helpful for people with gassy or bloated feelings with regular fiber Flax Seed - a less gassy fiber than psyllium No reading or other relaxing activity while on the toilet. If bowel movements take longer than 5 minutes, you are too constipated AVOID CONSTIPATION.  High fiber and water intake usually takes care of this.  Sometimes a laxative is needed to stimulate more frequent bowel movements, but  Laxatives are not a good long-term solution as it can wear the colon out.  They can help jump-start bowels if constipated, but should be relied on  constantly without discussing with your doctor Osmotics (Milk of Magnesia, Fleets phosphosoda, Magnesium citrate, MiraLax, GoLytely) are safer than  Stimulants (Senokot, Castor Oil, Dulcolax, Ex Lax)    Avoid taking laxatives for more than 7 days in a row.  IF SEVERELY CONSTIPATED, try a Bowel Retraining Program: Do not use laxatives.  Eat a diet high in roughage, such as bran cereals and leafy vegetables.  Drink six (6) ounces of prune or apricot juice each morning.  Eat two (2) large servings of stewed fruit each day.  Take one (1) heaping tablespoon of a psyllium-based bulking agent twice a day. Use sugar-free sweetener when possible to avoid excessive calories.  Eat a normal breakfast.  Set aside 15 minutes after breakfast to sit on the toilet, but do not strain to have a bowel movement.  If you do not have a bowel movement by the third day, use an enema and repeat the above steps.   CONTROLLING DIARRHEA  TAKE A FIBER SUPPLEMENT (FiberCon or Benefiner soluble fiber) twice a day - to thicken stools by absorbing excess fluid and retrain the intestines to act more normally.  Slowly increase the dose over a few weeks.  Too much fiber too soon can backfire and cause cramping & bloating.  TAKE AN IRON SUPPLEMENT twice a day to naturally constipate your bowels.  Usually ferrous sulfate 325mg  twice a day)  TAKE ANTI-DIARRHEAL MEDICINES: Loperamide (Imodium)  can slow down diarrhea.  Start with two tablets (= 4mg ) first and then try one tablet every 6 hours.  Can go up to 2 pills four times day (8 pills of 2mg  max) Avoid if you are having fevers or severe pain.  If you are not better or start feeling worse, stop all medicines and call your doctor for advice LoMotil (Diphenoxylate / Atropine) is another medicine that can constipate & slow down bowel moevements Pepto Bismol (bismuth) can gently thicken bowels as well  If diarrhea is worse,: drink plenty of liquids and try simpler foods for a few  days to avoid stressing your intestines further. Avoid dairy products (especially milk & ice cream) for a short time.  The intestines often can lose the ability to digest lactose when stressed. Avoid foods that cause gassiness or bloating.  Typical foods include beans and other legumes, cabbage, broccoli, and dairy foods.  Every person has some sensitivity to other foods, so listen to our body and avoid those foods that trigger problems for you.Call your doctor if you are getting worse or not better.  Sometimes further testing (cultures, endoscopy, X-ray studies, bloodwork, etc) may be needed to help diagnose and treat the cause of the diarrhea. Take extra anti-diarrheal medicines (maximum is 8 pills of 2mg  loperamide a day)  TROUBLESHOOTING IRREGULAR BOWELS 1) Avoid extremes of bowel movements (no bad constipation/diarrhea) 2) Miralax 17gm mixed in 8oz. water or juice-daily. May use BID as needed.  3) Gas-x,Phazyme, etc. as needed for gas & bloating.  4) Soft,bland diet. No spicy,greasy,fried foods.  5) Prilosec over-the-counter as needed  6) May hold gluten/wheat products from diet to see if symptoms improve.  7)  May try probiotics (Align, Activa, etc) to help calm the bowels down 7) If symptoms become worse call back immediately.

## 2022-11-25 NOTE — Consult Note (Signed)
   [facility] Inpatient Consult   11/25/2022  TESNEEM DUFRANE 06/13/1956 [id number]  [facility] [[facility]]  Accountable Care Organization [ACO] Patient: [employer] Medicare  Primary Care Provider:  [name], DO with [facility]   Patient is currently active with [facility] [[facility]] Care Management for chronic disease management services.  Patient has been engaged by a [facility] RN CC.  Our community based plan of care has focused on disease management and community resource support.    Patient will receive a post hospital call and will be evaluated for assessments and disease process education.    Plan:  Follow up with [facility] regarding hospitalization and ED visits and 2 admissions in less than 7 days for collaboration of hospitalizations and post hospital community support needs.  Of note, [facility] Care Management services does not replace or interfere with any services that are needed or arranged by inpatient [facility] care management team.   For additional questions or referrals please contact:  Charlesetta Shanks, RN BSN CCM [facility]  [phone] business mobile phone Toll free office [phone]  *Concierge Line  [phone] Fax number: [phone] [email or web address]

## 2022-11-26 ENCOUNTER — Telehealth: Payer: Self-pay

## 2022-11-26 NOTE — Transitions of Care (Post Inpatient/ED Visit) (Signed)
11/26/2022  Name: [name] MRN: [id number] DOB: March 14, 1956  Today's TOC FU Call Status: Today's TOC FU Call Status:: Successful TOC FU Call Competed TOC FU Call Complete Date: 11/26/22  Transition Care Management Follow-up Telephone Call Date of Discharge: 11/24/22 Discharge Facility: [facility]) Type of Discharge: Inpatient Admission Primary Inpatient Discharge Diagnosis:: Small Bowel Obstruction How have you been since you were released from the hospital?: Better Any questions or concerns?: No  Items Reviewed: Did you receive and understand the discharge instructions provided?: Yes Medications obtained,verified, and reconciled?: Yes (Medications Reviewed) Any new allergies since your discharge?: No Dietary orders reviewed?: Yes Type of Diet Ordered:: Soft diet Do you have support at home?: Yes People in Home: spouse Name of Support/Comfort Primary Source: [name]  Medications Reviewed Today: Medications Reviewed Today     Reviewed by Jodelle Gross, RN (Case Manager) on 11/26/22 at 1144  Med List Status: <None>   Medication Order Taking? Sig Documenting Provider Last Dose Status Informant  cetirizine (ZYRTEC ALLERGY) 10 MG tablet [id number] Yes Take 1 tablet (10 mg total) by mouth daily.  Patient taking differently: Take 10 mg by mouth daily as needed for allergies.   Gailen Shelter, [place] Taking Active Self  fluticasone (FLONASE) 50 MCG/ACT nasal spray [id number]  Place 2 sprays into both nostrils daily for 14 days. Gailen Shelter, [place]  Expired 11/17/22 2359 Self, Pharmacy Records  levothyroxine (SYNTHROID) 25 MCG tablet [id number] Yes Take 1 tablet (25 mcg total) by mouth daily. [name], DO Taking Active Self  Multiple Vitamin (MULTIVITAMIN WITH MINERALS) TABS tablet [id number] Yes Take 1 tablet by mouth daily. [provider] Taking Active Self  polyethylene glycol (MIRALAX / GLYCOLAX) 17 g packet [id number] Yes Take 17 g by mouth daily.  [name], Alvira Philips, DO Taking Active   simvastatin (ZOCOR) 20 MG tablet [id number] Yes Take 1 tablet (20 mg total) by mouth daily at 6 PM. [name], DO Taking Active Self            Home Care and Equipment/Supplies: Were Home Health Services Ordered?: No Any new equipment or medical supplies ordered?: No  Functional Questionnaire: Do you need assistance with bathing/showering or dressing?: No Do you need assistance with meal preparation?: No Do you need assistance with eating?: No Do you have difficulty maintaining continence: No Do you need assistance with getting out of bed/getting out of a chair/moving?: No Do you have difficulty managing or taking your medications?: No  Follow up appointments reviewed: PCP Follow-up appointment confirmed?: Yes Date of PCP follow-up appointment?: 12/06/22 Follow-up Provider: Dr. Nadine [facility] Follow-up appointment confirmed?: NA Do you need transportation to your follow-up appointment?: No Do you understand care options if your condition(s) worsen?: Yes-patient verbalized understanding  SDOH Interventions Today    Flowsheet Row Most Recent Value  SDOH Interventions   Food Insecurity Interventions Intervention Not Indicated  Housing Interventions Intervention Not Indicated  Transportation Interventions Intervention Not Indicated      Interventions Today    Flowsheet Row Most Recent Value  Chronic Disease   Chronic disease during today's visit Chronic Kidney Disease/End Stage Renal Disease (ESRD)  [CKD3]  General Interventions   General Interventions Discussed/Reviewed General Interventions Discussed  Nutrition Interventions   Nutrition Discussed/Reviewed Nutrition Discussed       TOC Interventions Today    Flowsheet Row Most Recent Value  TOC Interventions   TOC Interventions Discussed/Reviewed TOC Interventions Discussed, TOC Interventions Reviewed  Jodelle Gross, RN, BSN, CCM Care  Management Coordinator [facility] Phone: [phone]/Fax: [phone]

## 2022-12-02 ENCOUNTER — Ambulatory Visit: Payer: Medicare Other | Admitting: Obstetrics and Gynecology

## 2022-12-04 ENCOUNTER — Other Ambulatory Visit: Payer: Self-pay

## 2022-12-04 ENCOUNTER — Ambulatory Visit: Payer: Medicare Other | Attending: Obstetrics and Gynecology | Admitting: Physical Therapy

## 2022-12-04 DIAGNOSIS — M6281 Muscle weakness (generalized): Secondary | ICD-10-CM

## 2022-12-04 DIAGNOSIS — R279 Unspecified lack of coordination: Secondary | ICD-10-CM

## 2022-12-04 DIAGNOSIS — R293 Abnormal posture: Secondary | ICD-10-CM

## 2022-12-04 NOTE — Patient Instructions (Signed)

## 2022-12-04 NOTE — Therapy (Addendum)
OUTPATIENT PHYSICAL THERAPY FEMALE PELVIC EVALUATION   Patient Name: [name] MRN: [id number] DOB:[birth year], 67 y.o., female Today's Date: 12/04/2022  END OF SESSION:  PT End of Session - 12/04/22 0820     Visit Number 1    Date for PT Re-Evaluation 04/05/23    Authorization Type [employer]HC medicare/tricare    Authorization Time Period due recert 01/29/23    Progress Note Due on Visit 10    PT Start Time 0830    PT Stop Time 0915    PT Time Calculation (min) 45 min    Activity Tolerance Patient tolerated treatment well    Behavior During Therapy [facility] for tasks assessed/performed             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   Thyroid disease    Vertigo    Past Surgical History:  Procedure Laterality Date   ABDOMINAL ADHESION SURGERY  11/08/2020   Dr [name] - [facility]   ABDOMINAL HYSTERECTOMY  2003   [facility] - Dr [name] - for cervical cancer   BILE DUCT EXPLORATION  10/22/2003   CHOLECYSTECTOMY OPEN  10/22/2003   Dr Zachery Dakins - CBD repair w T tube   CYSTOSCOPY  2021   ILEOSTOMY  11/23/2018   ILEOSTOMY CLOSURE  04/2020   LOW ANTERIOR BOWEL RESECTION  11/23/2018   LAR/takedown colovaginal fistula/loop ileostomy  - [facility]   Patient Active Problem List   Diagnosis Date Noted   Subacute confusional state 11/24/2022   Memory loss 11/24/2022   Hypokalemia 11/19/2022   Renal atrophy, left 11/19/2022   AKI (acute kidney injury) (HCC) 11/18/2022   SBO (small bowel obstruction) (HCC) 11/17/2022   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   Seborrheic dermatitis 11/04/2022   Senile purpura (HCC) 11/04/2022   Telogen effluvium 11/04/2022   Neck pain 05/07/2022   Right ear pain 05/07/2022   Tympanic membrane perforation, marginal, right 02/27/2022   CKD (chronic kidney disease) stage 3, GFR 30-59 ml/min (HCC) 05/24/2021   Dysuria  04/16/2021   Vaginal irritation 04/16/2021   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   Imbalance 04/10/2021   Tinnitus aurium, right 04/10/2021   Non-recurrent acute serous otitis media of right ear 12/13/2020   Unspecified intestinal obstruction, unspecified as to partial versus complete obstruction (HCC) 10/27/2020   Constipation 10/25/2020   Nausea 10/25/2020   Hypothyroidism 06/24/2018   Smoker 06/24/2018   History of cervical cancer 06/24/2018   Mixed hyperlipidemia 06/24/2018   Essential tremor 05/08/2018   Chronic daily headache 05/06/2018   Lumbar spine painful on movement 05/06/2018   Vertigo 05/06/2018   Leg cramps 05/06/2018   Allergic rhinitis 04/09/2017   Anxiety 04/09/2017   Need for immunization against influenza 04/09/2017   Annual physical exam 04/09/2017    PCP: Delynn Flavin, DO  REFERRING PROVIDER: Marguerita Beards, MD   REFERRING DIAG: N32.81 (ICD-10-CM) - Overactive bladder N39.3 (ICD-10-CM) - SUI (stress urinary incontinence, female) N94.10 (ICD-10-CM) - Dyspareunia, female  THERAPY DIAG:  Muscle weakness (generalized)  Abnormal posture  Unspecified lack of coordination  Rationale for Evaluation and Treatment: Rehabilitation  ONSET DATE: years   SUBJECTIVE:  SUBJECTIVE STATEMENT: Recurrent UTIs, leakage all the time, can feel it, wears pads AAT. Uses 5-8 pads in 24 hour period. Doesn't think she has it with stressors. Does also worry about losing bowels but this hasn't happened  Fluid intake: Yes: water - 3-4 bottles per day; soda at least 1 per day; tea during day intermittently     PAIN:  Are you having pain? Yes NPRS scale: 5/10 Pain location: Vaginal  Pain type: stabbing Pain description: intermittent   Aggravating factors:  random, usually with mobility Relieving factors: nothing, it happens and goes quickly   PRECAUTIONS: None  WEIGHT BEARING RESTRICTIONS: No  FALLS:  Has patient fallen in last 6 months? No  LIVING ENVIRONMENT: Lives with: lives with their family Lives in: House/apartment   OCCUPATION: retired  PLOF: Independent  PATIENT GOALS: to not have to wear pads  PERTINENT HISTORY:  multiple prior complicated abdominal surgeries including hemicolectomy,  cervical cancer s/p hysterectomy, cholecystectomy, exploratory laparoscopy as well as prior pelvic radiation was recently hospitalized 6/2 to 6/4 for small bowel obstruction. Returned <24 hours later with abdominal pain/n/v Sexual abuse: No  BOWEL MOVEMENT: Pain with bowel movement: No Type of bowel movement:Type (Bristol Stool Scale) 5-6, Frequency daily now, and Strain No Fully empty rectum: Yes:   Leakage: No Pads: Yes: just in case Fiber supplement: No  URINATION: Pain with urination: No Fully empty bladder: Yes: but has increased frequency Stream: Strong and Weak Urgency: Yes:   Frequency: not during the night, but usually 1-1.5 hours in the day Leakage: Urge to void, Walking to the bathroom, and randomly Pads: Yes: 5-8 daily , sometimes wet in the morning but not commonly   INTERCOURSE: Pain with intercourse:  not active Ability to have vaginal penetration:  Yes: with dryness Climax: not able Marinoff Scale: 1/3  PREGNANCY: Vaginal deliveries 1 Tearing No C-section deliveries 0 Currently pregnant No  PROLAPSE: None   OBJECTIVE:   DIAGNOSTIC FINDINGS:    COGNITION: Overall cognitive status: Within functional limits for tasks assessed     SENSATION: Light touch: Appears intact Proprioception: Appears intact  MUSCLE LENGTH: Bil hamstrings and adductors limited by 25%   POSTURE: rounded shoulders, forward head, and posterior pelvic tilt  PELVIC ALIGNMENT: WFL  LUMBARAROM/PROM:  A/PROM A/PROM   eval  Flexion WFL  Extension Limited by 25%  Right lateral flexion Limited by 25%  Left lateral flexion Limited by 25%  Right rotation Limited by 25%  Left rotation Limited by 25%   (Blank rows = not tested)  LOWER EXTREMITY ROM:  WFL  LOWER EXTREMITY MMT: Bil hips grossly 4/5, knees 5/5  PALPATION:   General  mild TTP throughout abdomen, fascial restrictions throughout in all directions, scar tissue at multiple sites with several abdominal surgeries previously                 External Perineal Exam pt deferred                              Internal Pelvic Floor pt deferred   Patient confirms identification and approves PT to assess internal pelvic floor and treatment No  PELVIC MMT:   MMT eval  Vaginal   Internal Anal Sphincter   External Anal Sphincter   Puborectalis   Diastasis Recti   (Blank rows = not tested)        TONE: pt deferred   PROLAPSE: pt deferred   TODAY'S TREATMENT:  DATE:   12/04/22 EVAL Examination completed, findings reviewed, pt educated on POC, HEP, and gentle abdominal massage, urge drill. Pt motivated to participate in PT and agreeable to attempt recommendations.     PATIENT EDUCATION:  Education details: [id number] Person educated: Patient Education method: Explanation, Demonstration, Tactile cues, Verbal cues, and Handouts Education comprehension: verbalized understanding and returned demonstration  HOME EXERCISE PROGRAM: [id number]  ASSESSMENT:  CLINICAL IMPRESSION: Patient is a 67 y.o. female  who was seen today for physical therapy evaluation and treatment for urinary frequency, urinary leakage, pelvic pain. Pt has significant history of several abdominal surgeries, cervical cancer with radiation and hysterectomy, and small bowel obstructions. Pt found to have decreased flexibility in spine and hips, decreased  core and hip strength, posture impairments, tension and fascial restrictions throughout abdomen. Pt deferred internal assessment today requesting to attempt other treatment options first to see if these help. Pt given handouts and reviewed for urge drill, HEP, and abdominal massage for home. Pt reports she doesn't live close to clinic and would like to space out appointments if able. Pt would benefit from additional PT to further address deficits.     OBJECTIVE IMPAIRMENTS: decreased coordination, decreased endurance, decreased mobility, decreased strength, increased fascial restrictions, impaired flexibility, improper body mechanics, postural dysfunction, and pain.   ACTIVITY LIMITATIONS: continence  PARTICIPATION LIMITATIONS: interpersonal relationship and community activity  PERSONAL FACTORS: Time since onset of injury/illness/exacerbation and 1 comorbidity: medical history  are also affecting patient's functional outcome.   REHAB POTENTIAL: Good  CLINICAL DECISION MAKING: Evolving/moderate complexity  EVALUATION COMPLEXITY: Moderate   GOALS: Goals reviewed with patient? Yes  SHORT TERM GOALS: Target date: 01/01/23  Pt to be I with HEP.  Baseline: Goal status: INITIAL   LONG TERM GOALS: Target date: 04/05/23  Pt to be I with advanced HEP.  Baseline:  Goal status: INITIAL  2.  Pt will have 50% less urgency due to bladder retraining and strengthening  Baseline:  Goal status: INITIAL  3.  Pt to demonstrate at least 4/5 pelvic floor strength and ability to fully relax post each contraction for improved pelvic stability and decreased strain at pelvic floor/ decrease leakage.  Baseline:  Goal status: INITIAL  4.  Pt to demonstrate at least 5/5 bil hip strength for improved pelvic stability and functional squats without leakage.  Baseline:  Goal status: INITIAL  5.  Pt to demonstrate improved coordination of pelvic floor and breathing mechanics with 10# squat with appropriate  synergistic patterns to decrease pain and leakage at least 75% of the time.    Baseline: Goal status: INITIAL  6.  Pt will report her BMs and bladder voids are complete due to improved evacuation techniques.  Baseline:  Goal status: INITIAL  7.  Pt will have to use 3 pads per day for improved skin integrity. Baseline:  Goal status: INITIAL  8.  Pt to report no more than 2/10 pain with vaginal penetration for improved tolerance to medical exams. Baseline:  Goal status: INITIAL  PLAN:  PT FREQUENCY: every other week  PT DURATION:  8 sessions  PLANNED INTERVENTIONS: Therapeutic exercises, Therapeutic activity, Neuromuscular re-education, Patient/Family education, Self Care, Joint mobilization, Aquatic Therapy, Dry Needling, Spinal mobilization, Cryotherapy, Moist heat, scar mobilization, Taping, Biofeedback, and Manual therapy  PLAN FOR NEXT SESSION: internal if pt consents, manual at pelvic floor/spine/hips/abdomen, breathing mechanics, hips and core strengthening, rib mobility, voiding mechanics, pressure management, bladder retraining, urge drill  Otelia Sergeant, PT, DPT 06/19/249:31 AM   PHYSICAL THERAPY DISCHARGE  SUMMARY  Visits from Start of Care: 1  Current functional level related to goals / functional outcomes: Unable to formally reassess as pt is unable to return for treatment at this time   Remaining deficits: Unable to formally reassess   Education / Equipment: HEP   Patient agrees to discharge. Patient goals were not met. Patient is being discharged due to not returning since the last visit.  Otelia Sergeant, PT, DPT 02/25/2409:36 AM

## 2022-12-05 ENCOUNTER — Ambulatory Visit: Payer: Self-pay | Admitting: *Deleted

## 2022-12-05 NOTE — Patient Outreach (Incomplete)
  Care Coordination   Follow Up Visit Note   12/05/2022 Name: [name] MRN: [id number] DOB: [birth year]  [name] is a 67 y.o. year old female who sees [name], DO for primary care. I spoke with  [name] by phone today.  What matters to the patients health and wellness today?  Doing well. Tolerating pelvic rehabilitation She has her new visits to  Small bowel obstruction denies any bloating, constipation,  Tolerating miralax more in the morning  More stool is mushy Bulking up stool     Goals Addressed   None     SDOH assessments and interventions completed:  No{THN Tip this will not be part of the note when signed-REQUIRED REPORT FIELD DO NOT DELETE (Optional):27901}     Care Coordination Interventions:  Yes, provided {THN Tip this will not be part of the note when signed-REQUIRED REPORT FIELD DO NOT DELETE (Optional):27901}  Follow up plan: Follow up call scheduled for ***   Encounter Outcome:  Pt. Visit Completed {THN Tip this will not be part of the note when signed-REQUIRED REPORT FIELD DO NOT DELETE (Optional):27901}  Ziyanna Tolin L. Noelle Penner, RN, BSN, CCM [facility] Care Management Community Coordinator Office number [phone]    .[email or web address]

## 2022-12-06 ENCOUNTER — Ambulatory Visit (INDEPENDENT_AMBULATORY_CARE_PROVIDER_SITE_OTHER): Payer: Medicare Other | Admitting: Family Medicine

## 2022-12-06 ENCOUNTER — Encounter: Payer: Self-pay | Admitting: Family Medicine

## 2022-12-06 ENCOUNTER — Encounter: Payer: Medicare Other | Admitting: *Deleted

## 2022-12-06 VITALS — BP 107/68 | HR 73 | Temp 98.6°F | Ht 62.0 in | Wt 124.0 lb

## 2022-12-06 DIAGNOSIS — J329 Chronic sinusitis, unspecified: Secondary | ICD-10-CM | POA: Diagnosis not present

## 2022-12-06 DIAGNOSIS — R413 Other amnesia: Secondary | ICD-10-CM

## 2022-12-06 DIAGNOSIS — Z09 Encounter for follow-up examination after completed treatment for conditions other than malignant neoplasm: Secondary | ICD-10-CM

## 2022-12-06 DIAGNOSIS — K56609 Unspecified intestinal obstruction, unspecified as to partial versus complete obstruction: Secondary | ICD-10-CM | POA: Diagnosis not present

## 2022-12-06 MED ORDER — AMOXICILLIN-POT CLAVULANATE 875-125 MG PO TABS
1.0000 | ORAL_TABLET | Freq: Two times a day (BID) | ORAL | 0 refills | Status: DC
Start: 2022-12-06 — End: 2023-01-08

## 2022-12-06 MED ORDER — SIMVASTATIN 20 MG PO TABS: 20.0000 mg | ORAL_TABLET | Freq: Every day | ORAL | 0 refills | Status: DC

## 2022-12-06 NOTE — Progress Notes (Signed)
Dismissal letter sent. 

## 2022-12-06 NOTE — Progress Notes (Signed)
Subjective: CC: Hospital discharge follow-up PCP: Kaitlin Ip, DO ZOX:WRUE F Raymond is a 67 y.o. female presenting to clinic today for:  1.  Hospital discharge follow-up Patient presents today for hospital discharge follow-up for SBO.  She was hospitalized at [facility] after presenting with 24 hours of recurrent abdominal pain associate with nausea and vomiting.  Of note she had just been hospitalized from 6/2-6/4 for small bowel obstruction.  She reports today that she is having bowel movements and sometimes they are hard but sometimes they are soft.  She denies any nausea, vomiting.  Still has some mild left lower quadrant abdominal pain.  She will be seeing her surgeon at [facility] for repeat surgical intervention to remove adhesions.  Her main focus today is her dissatisfaction with inability to get an appointment here, specifically with me.  She notes that she had to seek care in the ER in March for a frontal sinusitis and had to wait 5 hours to be seen there.  She attributes this infection having gone on since our July 16, 2022 video visit.  She saw her ENT provider on 07/26/2022 where she complained of sinus headache.  At that time it was not felt to be infectious and she was not prescribed any oral antibiotics.  She continues to have frontal sinus pain and rhinitis despite compliance with oral antihistamine, nasal spray and weekly sinus irrigation.  She has not followed back up with ENT regarding this, as she is understandably prioritizing her gastric issues.   ROS: Per HPI  Allergies  Allergen Reactions   Barium-Containing Compounds     hives   Percocet [Oxycodone-Acetaminophen] Other (See Comments)    migraine    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   Thyroid disease    Vertigo     Current Outpatient  Medications:    cetirizine (ZYRTEC ALLERGY) 10 MG tablet, Take 1 tablet (10 mg total) by mouth daily. (Patient taking differently: Take 10 mg by mouth daily as needed for allergies.), Disp: 30 tablet, Rfl: 0   levothyroxine (SYNTHROID) 25 MCG tablet, Take 1 tablet (25 mcg total) by mouth daily., Disp: 90 tablet, Rfl: 3   Multiple Vitamin (MULTIVITAMIN WITH MINERALS) TABS tablet, Take 1 tablet by mouth daily., Disp: , Rfl:    polyethylene glycol (MIRALAX / GLYCOLAX) 17 g packet, Take 17 g by mouth daily., Disp: 30 each, Rfl: 2   simvastatin (ZOCOR) 20 MG tablet, Take 1 tablet (20 mg total) by mouth daily at 6 PM., Disp: 90 tablet, Rfl: 0   fluticasone (FLONASE) 50 MCG/ACT nasal spray, Place 2 sprays into both nostrils daily for 14 days., Disp: 11.1 mL, Rfl: 0 Social History   Socioeconomic History   Marital status: Married    Spouse name: Not on file   Number of children: 1   Years of education: Not on file   Highest education level: Not on file  Occupational History   Occupation: retired    Comment: Conservation officer, 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  Tobacco Use   Smoking status: Former    Packs/day: .15    Types: Cigarettes   Smokeless tobacco: Never  Vaping Use   Vaping Use: Never used  Substance and Sexual Activity   Alcohol use: Not Currently   Drug use: No  Sexual activity: Not Currently  Other Topics Concern   Not on file  Social History Narrative   Patient is a retired Conservation officer, 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   Patient is married and has 1 daughter and 1 granddaughter who reside about 4 miles from her   Social Determinants of Health   Financial Resource Strain: Low Risk  (11/06/2022)   Overall Financial Resource Strain (CARDIA)    Difficulty of Paying Living Expenses: Not hard at all  Food Insecurity: No Food Insecurity (11/26/2022)   Hunger Vital Sign    Worried About Running Out of Food in the Last Year: Never true    Ran Out of Food in the Last Year: Never true  Transportation  Needs: No Transportation Needs (11/26/2022)   PRAPARE - Administrator, Civil Service (Medical): No    Lack of Transportation (Non-Medical): No  Physical Activity: Not on file  Stress: No Stress Concern Present (11/06/2022)   [name] of Occupational Health - Occupational Stress Questionnaire    Feeling of Stress : Only a little  Social Connections: Not on file  Intimate Partner Violence: Not At Risk (11/23/2022)   Humiliation, Afraid, Rape, and Kick questionnaire    Fear of Current or Ex-Partner: No    Emotionally Abused: No    Physically Abused: No    Sexually Abused: No   Family History  Problem Relation Age of Onset   Heart disease Father    Diabetes Father    Parkinson's disease Neg Hx     Objective: Office vital signs reviewed. BP 107/68   Pulse 73   Temp 98.6 F (37 C)   Ht 5\' 2"  (1.575 m)   Wt 124 lb (56.2 kg)   SpO2 97%   BMI 22.68 kg/m   Physical Examination:  General: Awake, alert, nontoxic female, No acute distress HEENT: Normal    Neck: No masses palpated. No lymphadenopathy    Ears: Tympanic membrane on left intact with normal light reflex.  Right TM obscured by dried skin and cerumen    Eyes: PERRLA, extraocular membranes intact, sclera white    Nose: nasal turbinates moist, clear nasal discharge    Throat: moist mucus membranes, no erythema, no tonsillar exudate.  Airway is patent Cardio: regular rate and rhythm, S1S2 heard, no murmurs appreciated Pulm: clear to auscultation bilaterally, no wheezes, rhonchi or rales; normal work of breathing on room air GI: soft, mild left-sided lower quadrant and upper quadrant tenderness.  No rebound or guarding, non-distended, bowel sounds present x4, no hepatomegaly, no splenomegaly, no masses Psych: Mood is labile    Assessment/ Plan: 67 y.o. female   SBO (small bowel obstruction) [facility])  Hospital discharge follow-up  Recurrent sinusitis - Plan: amoxicillin-clavulanate (AUGMENTIN) 875-125 MG  tablet  Memory changes  She seems to be healing from SBO and having regular bowel movements, albeit not entirely her normal.  She has appropriate follow-up with her gastro surgeon for what I suspect is adhesion removal surgery.  She appeared well-hydrated on exam.  Exam did demonstrate mild left lower quadrant abdominal pain however.  She demonstrated no guarding or rebound  I am going to treat her with oral antibiotics since this seemed to help her sinusitis in the past though I did not observe any significant purulence on her exam today.  She was afebrile.  I encouraged her to continue home sinus care as directed by ENT with low threshold to follow-up with ENT if she has ongoing  symptoms.  I did address the concerns about possible MCI in the hospital but patient was adamant about not seeing someone to follow-up on this because she does not feel that she has this issue.  Unfortunately, she seems quite dissatisfied with the care here and for this reason we will assist her in finding primary care elsewhere that is more accommodating to her needs.  I have made sure that she has at least 90-day supply of all medications that are prescribed from this office.  I wish her the best on her health and future endeavors.  No orders of the defined types were placed in this encounter.  No orders of the defined types were placed in this encounter.   Today's visit is for Transitional Care Management.  The patient was discharged from [facility] on 11/24/2022 with a primary diagnosis of SBO.   Contact with the patient and/or caregiver, by a clinical staff member, was made on 11/26/2022 and was documented as a telephone encounter within the EMR.  Through chart review and discussion with the patient I have determined that management of their condition is of moderate complexity.    Kaitlin Ip, DO [facility] [phone]

## 2023-01-01 ENCOUNTER — Ambulatory Visit: Payer: Medicare Other | Admitting: Physical Therapy

## 2023-01-06 ENCOUNTER — Emergency Department (HOSPITAL_COMMUNITY): Payer: Medicare Other

## 2023-01-06 ENCOUNTER — Inpatient Hospital Stay (HOSPITAL_COMMUNITY)
Admission: EM | Admit: 2023-01-06 | Discharge: 2023-01-07 | DRG: 390 | Discharge status: Against Medical Advice | Payer: Medicare Other | Attending: Family Medicine | Admitting: Family Medicine

## 2023-01-06 ENCOUNTER — Other Ambulatory Visit: Payer: Self-pay

## 2023-01-06 ENCOUNTER — Encounter (HOSPITAL_COMMUNITY): Payer: Self-pay | Admitting: Emergency Medicine

## 2023-01-06 DIAGNOSIS — R112 Nausea with vomiting, unspecified: Secondary | ICD-10-CM | POA: Diagnosis not present

## 2023-01-06 DIAGNOSIS — Z87891 Personal history of nicotine dependence: Secondary | ICD-10-CM

## 2023-01-06 DIAGNOSIS — R1110 Vomiting, unspecified: Secondary | ICD-10-CM | POA: Diagnosis present

## 2023-01-06 DIAGNOSIS — Z8541 Personal history of malignant neoplasm of cervix uteri: Secondary | ICD-10-CM

## 2023-01-06 DIAGNOSIS — Z8249 Family history of ischemic heart disease and other diseases of the circulatory system: Secondary | ICD-10-CM | POA: Diagnosis not present

## 2023-01-06 DIAGNOSIS — K56609 Unspecified intestinal obstruction, unspecified as to partial versus complete obstruction: Principal | ICD-10-CM | POA: Diagnosis present

## 2023-01-06 DIAGNOSIS — K449 Diaphragmatic hernia without obstruction or gangrene: Secondary | ICD-10-CM | POA: Diagnosis not present

## 2023-01-06 LAB — URINALYSIS, ROUTINE W REFLEX MICROSCOPIC
Bilirubin Urine: NEGATIVE
Glucose, UA: NEGATIVE mg/dL
Ketones, ur: NEGATIVE mg/dL
Nitrite: NEGATIVE
Protein, ur: NEGATIVE mg/dL
Specific Gravity, Urine: 1.016 (ref 1.005–1.030)
pH: 5 (ref 5.0–8.0)

## 2023-01-06 LAB — COMPREHENSIVE METABOLIC PANEL
ALT: 17 U/L (ref 0–44)
AST: 19 U/L (ref 15–41)
Albumin: 4.4 g/dL (ref 3.5–5.0)
Alkaline Phosphatase: 77 U/L (ref 38–126)
Anion gap: 11 (ref 5–15)
BUN: 26 mg/dL — ABNORMAL HIGH (ref 8–23)
CO2: 24 mmol/L (ref 22–32)
Calcium: 10.1 mg/dL (ref 8.9–10.3)
Chloride: 103 mmol/L (ref 98–111)
Creatinine, Ser: 1.42 mg/dL — ABNORMAL HIGH (ref 0.44–1.00)
GFR, Estimated: 41 mL/min — ABNORMAL LOW (ref >60)
Glucose, Bld: 161 mg/dL — ABNORMAL HIGH (ref 70–99)
Potassium: 4.9 mmol/L (ref 3.5–5.1)
Sodium: 138 mmol/L (ref 135–145)
Total Bilirubin: 1 mg/dL (ref 0.3–1.2)
Total Protein: 7.5 g/dL (ref 6.5–8.1)

## 2023-01-06 LAB — CBC
HCT: 46.2 % — ABNORMAL HIGH (ref 36.0–46.0)
Hemoglobin: 15.9 g/dL — ABNORMAL HIGH (ref 12.0–15.0)
MCH: 31.8 pg (ref 26.0–34.0)
MCHC: 34.4 g/dL (ref 30.0–36.0)
MCV: 92.4 fL (ref 80.0–100.0)
Platelets: 212 10*3/uL (ref 150–400)
RBC: 5 MIL/uL (ref 3.87–5.11)
RDW: 12.1 % (ref 11.5–15.5)
WBC: 11.1 10*3/uL — ABNORMAL HIGH (ref 4.0–10.5)
nRBC: 0 % (ref 0.0–0.2)

## 2023-01-06 LAB — LIPASE, BLOOD: Lipase: 26 U/L (ref 11–51)

## 2023-01-06 MED ORDER — ONDANSETRON HCL 4 MG/2ML IJ SOLN: 4.0000 mg | Freq: Once | INTRAMUSCULAR | Status: DC | PRN

## 2023-01-06 MED ORDER — SODIUM CHLORIDE 0.9 % IV BOLUS
1000.0000 mL | Freq: Once | INTRAVENOUS | Status: AC
  Administered 2023-01-06: 1000 mL via INTRAVENOUS

## 2023-01-06 MED ORDER — IOHEXOL 300 MG/ML  SOLN
80.0000 mL | Freq: Once | INTRAMUSCULAR | Status: AC | PRN
  Administered 2023-01-06: 100 mL via INTRAVENOUS

## 2023-01-06 NOTE — ED Provider Notes (Signed)
AP-EMERGENCY DEPT [facility] Emergency Department Provider Note MRN:  [id number]  Arrival date & time: 01/07/23     Chief Complaint   Emesis   History of Present Illness   [name] is a 67 y.o. year-old female with a history of SBO presenting to the ED with chief complaint of emesis.  4 days of nausea and vomiting, not having normal bowel movements.  Generalized abdominal discomfort.  Review of Systems  A thorough review of systems was obtained and all systems are negative except as noted in the HPI and PMH.   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   Thyroid disease    Vertigo     Past Surgical History:  Procedure Laterality Date   ABDOMINAL ADHESION SURGERY  11/08/2020   Dr [name] - [facility]   ABDOMINAL HYSTERECTOMY  2003   [facility] - Dr [name] - for cervical cancer   BILE DUCT EXPLORATION  10/22/2003   CHOLECYSTECTOMY OPEN  10/22/2003   Dr Zachery Dakins - CBD repair w T tube   CYSTOSCOPY  2021   ILEOSTOMY  11/23/2018   ILEOSTOMY CLOSURE  04/2020   LOW ANTERIOR BOWEL RESECTION  11/23/2018   LAR/takedown colovaginal fistula/loop ileostomy  - [facility]    Family History  Problem Relation Age of Onset   Heart disease Father    Diabetes Father    Parkinson's disease Neg Hx     Social History   Socioeconomic History   Marital status: Married    Spouse name: Not on file   Number of children: 1   Years of education: Not on file   Highest education level: Not on file  Occupational History   Occupation: retired    Comment: Conservation officer, 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  Tobacco Use   Smoking status: Former    Current packs/day: 0.15    Types: Cigarettes   Smokeless tobacco: Never  Vaping Use   Vaping status: Never Used  Substance and Sexual Activity   Alcohol use: Not Currently   Drug use: No   Sexual  activity: Not Currently  Other Topics Concern   Not on file  Social History Narrative   Patient is a retired Conservation officer, 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   Patient is married and has 1 daughter and 1 granddaughter who reside about 4 miles from her   Social Determinants of Health   Financial Resource Strain: Low Risk  (11/06/2022)   Overall Financial Resource Strain (CARDIA)    Difficulty of Paying Living Expenses: Not hard at all  Food Insecurity: No Food Insecurity (11/26/2022)   Hunger Vital Sign    Worried About Running Out of Food in the Last Year: Never true    Ran Out of Food in the Last Year: Never true  Transportation Needs: No Transportation Needs (11/26/2022)   PRAPARE - Administrator, Civil Service (Medical): No    Lack of Transportation (Non-Medical): No  Physical Activity: Not on file  Stress: No Stress Concern Present (11/06/2022)   [employer] of Occupational Health - Occupational Stress Questionnaire    Feeling of Stress : Only a little  Social Connections: Not on file  Intimate Partner Violence: Not At Risk (11/23/2022)   Humiliation, Afraid, Rape, and Kick questionnaire  Fear of Current or Ex-Partner: No    Emotionally Abused: No    Physically Abused: No    Sexually Abused: No     Physical Exam   Vitals:   01/07/23 0030 01/07/23 0100  BP: 129/81 123/74  Pulse: 82 76  Resp: 14 18  Temp:    SpO2: 100% 98%    CONSTITUTIONAL: Well-appearing, NAD NEURO/PSYCH:  Alert and oriented x 3, no focal deficits EYES:  eyes equal and reactive ENT/NECK:  no LAD, no JVD CARDIO: Regular rate, well-perfused, normal S1 and S2 PULM:  CTAB no wheezing or rhonchi GI/GU:  non-distended, non-tender MSK/SPINE:  No gross deformities, no edema SKIN:  no rash, atraumatic   *Additional and/or pertinent findings included in MDM below  Diagnostic and Interventional Summary    EKG Interpretation Date/Time:    Ventricular Rate:    PR Interval:    QRS  Duration:    QT Interval:    QTC Calculation:   R Axis:      Text Interpretation:         Labs Reviewed  COMPREHENSIVE METABOLIC PANEL - Abnormal; Notable for the following components:      Result Value   Glucose, Bld 161 (*)    BUN 26 (*)    Creatinine, Ser 1.42 (*)    GFR, Estimated 41 (*)    All other components within normal limits  CBC - Abnormal; Notable for the following components:   WBC 11.1 (*)    Hemoglobin 15.9 (*)    HCT 46.2 (*)    All other components within normal limits  URINALYSIS, ROUTINE W REFLEX MICROSCOPIC - Abnormal; Notable for the following components:   APPearance HAZY (*)    Hgb urine dipstick SMALL (*)    Leukocytes,Ua LARGE (*)    Bacteria, UA RARE (*)    All other components within normal limits  LIPASE, BLOOD    CT ABDOMEN PELVIS W CONTRAST  Final Result      Medications  ondansetron (ZOFRAN) injection 4 mg (has no administration in time range)  0.9 %  sodium chloride infusion (has no administration in time range)  sodium chloride 0.9 % bolus 1,000 mL (1,000 mLs Intravenous New Bag/Given 01/06/23 2325)  iohexol (OMNIPAQUE) 300 MG/ML solution 80 mL (100 mLs Intravenous Contrast Given 01/06/23 2333)     Procedures  /  Critical Care .Critical Care  Performed by: Sabas Sous, MD Authorized by: Sabas Sous, MD   Critical care provider statement:    Critical care time (minutes):  32   Critical care was necessary to treat or prevent imminent or life-threatening deterioration of the following conditions: High-grade small bowel obstruction.   Critical care was time spent personally by me on the following activities:  Development of treatment plan with patient or surrogate, discussions with consultants, evaluation of patient's response to treatment, examination of patient, ordering and review of laboratory studies, ordering and review of radiographic studies, ordering and performing treatments and interventions, pulse oximetry,  re-evaluation of patient's condition and review of old charts   ED Course and Medical Decision Making  Initial Impression and Ddx Patient reports history of multiple abdominal surgeries, history of scar tissue, history of SBO.  And so this is suspicious for recurrent SBO.  Abdomen soft and nontender, vitals normal, currently with minimal symptoms.  Past medical/surgical history that increases complexity of ED encounter: SBO  Interpretation of Diagnostics I personally reviewed the laboratory assessment and my interpretation is as follows: No significant  blood count or electrolyte disturbance  CT confirms high-grade obstruction.  Patient Reassessment and Ultimate Disposition/Management     Will place NG tube, admit to medicine.  Surgery to evaluate in the morning.  1:40 AM update: Patient frustrated that she has another small bowel obstruction, she really does not want to have an NG tube, does not want surgery, does not want to be admitted.  We had multiple extensive conversations about the pros and cons of going home.  Made it clear to the patient that if she left the hospital it would be AGAINST MEDICAL ADVICE because of the risks of her condition worsening, bowel ischemia, death.  She expressed full understanding of this and still she decides to leave AGAINST MEDICAL ADVICE, she says that she has had these before and knows how to restrict her diet, says she will return in a few days for reassessment if symptoms continue.  Patient management required discussion with the following services or consulting groups:  Hospitalist Service  Complexity of Problems Addressed Acute illness or injury that poses threat of life of bodily function  Additional Data Reviewed and Analyzed Further history obtained from: Further history from spouse/family member  Additional Factors Impacting ED Encounter Risk Consideration of hospitalization  Elmer Sow. Pilar Plate, MD [facility] Emergency Medicine [facility] [email or web address]  Final Clinical Impressions(s) / ED Diagnoses     ICD-10-CM   1. SBO (small bowel obstruction) (HCC)  K56.609       ED Discharge Orders     None        Discharge Instructions Discussed with and Provided to Patient:     Discharge Instructions      You are leaving the hospital AGAINST MEDICAL ADVICE.  We discussed your condition at length, small bowel obstruction.  We discussed the risks of leaving the emergency department and not being admitted to the hospital.  We discussed worsening of condition, bowel ischemia, death.  You can return to the emergency department at any time for continued care.        Sabas Sous, MD 01/06/23 1610    Sabas Sous, MD 01/07/23 [phone]

## 2023-01-06 NOTE — ED Notes (Signed)
ED Provider at bedside. 

## 2023-01-06 NOTE — ED Notes (Signed)
Patient transported to CT 

## 2023-01-06 NOTE — ED Triage Notes (Signed)
Pt via POV c/o n/v since 4 or 5 days ago with constant vomiting starting this evening. Pt has not had firm, normal stool since onset of symptoms. Pt also reports lower abdominal pain, chronic. PMH includes HLD and thyroid disorder.

## 2023-01-07 DIAGNOSIS — Z8249 Family history of ischemic heart disease and other diseases of the circulatory system: Secondary | ICD-10-CM | POA: Diagnosis not present

## 2023-01-07 DIAGNOSIS — R1110 Vomiting, unspecified: Secondary | ICD-10-CM | POA: Diagnosis present

## 2023-01-07 DIAGNOSIS — Z8541 Personal history of malignant neoplasm of cervix uteri: Secondary | ICD-10-CM | POA: Diagnosis not present

## 2023-01-07 DIAGNOSIS — Z87891 Personal history of nicotine dependence: Secondary | ICD-10-CM | POA: Diagnosis not present

## 2023-01-07 DIAGNOSIS — K56609 Unspecified intestinal obstruction, unspecified as to partial versus complete obstruction: Secondary | ICD-10-CM | POA: Diagnosis present

## 2023-01-07 MED ORDER — SODIUM CHLORIDE 0.9 % IV SOLN: INTRAVENOUS | Status: DC

## 2023-01-07 NOTE — ED Notes (Signed)
Pt refused NG tube at this time.

## 2023-01-07 NOTE — Discharge Instructions (Signed)
You are leaving the hospital AGAINST MEDICAL ADVICE.  We discussed your condition at length, small bowel obstruction.  We discussed the risks of leaving the emergency department and not being admitted to the hospital.  We discussed worsening of condition, bowel ischemia, death.  You can return to the emergency department at any time for continued care.

## 2023-01-07 NOTE — ED Notes (Signed)
ED Provider at bedside. 

## 2023-01-08 ENCOUNTER — Emergency Department (HOSPITAL_COMMUNITY)
Admission: EM | Admit: 2023-01-08 | Discharge: 2023-01-08 | Disposition: A | Discharge status: Home / Self Care | Payer: Medicare Other | Attending: Emergency Medicine | Admitting: Emergency Medicine

## 2023-01-08 ENCOUNTER — Other Ambulatory Visit: Payer: Self-pay

## 2023-01-08 ENCOUNTER — Encounter (HOSPITAL_COMMUNITY): Payer: Self-pay | Admitting: Emergency Medicine

## 2023-01-08 ENCOUNTER — Emergency Department (HOSPITAL_COMMUNITY): Payer: Medicare Other

## 2023-01-08 DIAGNOSIS — K56609 Unspecified intestinal obstruction, unspecified as to partial versus complete obstruction: Secondary | ICD-10-CM | POA: Diagnosis not present

## 2023-01-08 DIAGNOSIS — Z79899 Other long term (current) drug therapy: Secondary | ICD-10-CM | POA: Insufficient documentation

## 2023-01-08 DIAGNOSIS — N3001 Acute cystitis with hematuria: Secondary | ICD-10-CM

## 2023-01-08 DIAGNOSIS — R1030 Lower abdominal pain, unspecified: Secondary | ICD-10-CM

## 2023-01-08 DIAGNOSIS — R109 Unspecified abdominal pain: Secondary | ICD-10-CM | POA: Diagnosis not present

## 2023-01-08 LAB — CBC WITH DIFFERENTIAL/PLATELET
Abs Immature Granulocytes: 0.02 10*3/uL (ref 0.00–0.07)
Basophils Absolute: 0 10*3/uL (ref 0.0–0.1)
Basophils Relative: 0 %
Eosinophils Absolute: 0.1 10*3/uL (ref 0.0–0.5)
Eosinophils Relative: 1 %
HCT: 39.8 % (ref 36.0–46.0)
Hemoglobin: 13.7 g/dL (ref 12.0–15.0)
Immature Granulocytes: 0 %
Lymphocytes Relative: 14 %
Lymphs Abs: 1 10*3/uL (ref 0.7–4.0)
MCH: 32 pg (ref 26.0–34.0)
MCHC: 34.4 g/dL (ref 30.0–36.0)
MCV: 93 fL (ref 80.0–100.0)
Monocytes Absolute: 0.5 10*3/uL (ref 0.1–1.0)
Monocytes Relative: 8 %
Neutro Abs: 5.1 10*3/uL (ref 1.7–7.7)
Neutrophils Relative %: 77 %
Platelets: 162 10*3/uL (ref 150–400)
RBC: 4.28 MIL/uL (ref 3.87–5.11)
RDW: 12.2 % (ref 11.5–15.5)
WBC: 6.8 10*3/uL (ref 4.0–10.5)
nRBC: 0 % (ref 0.0–0.2)

## 2023-01-08 LAB — URINALYSIS, ROUTINE W REFLEX MICROSCOPIC
Bilirubin Urine: NEGATIVE
Glucose, UA: NEGATIVE mg/dL
Ketones, ur: NEGATIVE mg/dL
Nitrite: POSITIVE — AB
Protein, ur: 30 mg/dL — AB
Specific Gravity, Urine: 1.024 (ref 1.005–1.030)
WBC, UA: >50 WBC/hpf (ref 0–5)
pH: 5 (ref 5.0–8.0)

## 2023-01-08 LAB — COMPREHENSIVE METABOLIC PANEL
ALT: 14 U/L (ref 0–44)
AST: 16 U/L (ref 15–41)
Albumin: 3.8 g/dL (ref 3.5–5.0)
Alkaline Phosphatase: 63 U/L (ref 38–126)
Anion gap: 9 (ref 5–15)
BUN: 36 mg/dL — ABNORMAL HIGH (ref 8–23)
CO2: 24 mmol/L (ref 22–32)
Calcium: 9.2 mg/dL (ref 8.9–10.3)
Chloride: 106 mmol/L (ref 98–111)
Creatinine, Ser: 1.38 mg/dL — ABNORMAL HIGH (ref 0.44–1.00)
GFR, Estimated: 42 mL/min — ABNORMAL LOW (ref >60)
Glucose, Bld: 108 mg/dL — ABNORMAL HIGH (ref 70–99)
Potassium: 4 mmol/L (ref 3.5–5.1)
Sodium: 139 mmol/L (ref 135–145)
Total Bilirubin: 0.8 mg/dL (ref 0.3–1.2)
Total Protein: 6.8 g/dL (ref 6.5–8.1)

## 2023-01-08 LAB — LIPASE, BLOOD: Lipase: 24 U/L (ref 11–51)

## 2023-01-08 MED ORDER — MORPHINE SULFATE (PF) 4 MG/ML IV SOLN
4.0000 mg | Freq: Once | INTRAVENOUS | Status: AC
  Administered 2023-01-08: 4 mg via INTRAVENOUS
  Filled 2023-01-08: qty 1

## 2023-01-08 MED ORDER — SODIUM CHLORIDE 0.9 % IV BOLUS
1000.0000 mL | Freq: Once | INTRAVENOUS | Status: AC
  Administered 2023-01-08: 1000 mL via INTRAVENOUS

## 2023-01-08 MED ORDER — ONDANSETRON HCL 4 MG/2ML IJ SOLN
4.0000 mg | Freq: Once | INTRAMUSCULAR | Status: AC
  Administered 2023-01-08: 4 mg via INTRAVENOUS
  Filled 2023-01-08: qty 2

## 2023-01-08 MED ORDER — CEPHALEXIN 500 MG PO CAPS: 500.0000 mg | ORAL_CAPSULE | Freq: Four times a day (QID) | ORAL | 0 refills | Status: DC

## 2023-01-08 NOTE — ED Notes (Signed)
Pt ambulatory to waiting room. Pt verbalized understanding of discharge instructions.   

## 2023-01-08 NOTE — Discharge Instructions (Addendum)
Continue liquid diet.  Your urine test shows you have a UTI.  Please take the antibiotic as directed until its finished.  Call Dr. [name] office to arrange follow-up appointment

## 2023-01-08 NOTE — Consult Note (Signed)
Medical Consultation   [name]  BJY:[id number]  DOB: [birth year]  DOA: 01/08/2023  PCP: Patient, [name]   Requesting physician: ED Provider  Reason for consultation: SBO  History of Present Illness: [name] is an 67 y.o. female for CKD3, Hypothyroidism, SBO, Cervical cancer.  Patient presented to the ED with complaints of abdominal pain, nausea and vomiting.    Patient was just in the ED, 2 days ago, 01/06/23-with similar symptoms, CT abdomen and pelvis with contrast showed findings consistent with high-grade small bowel obstruction, with transition point in the anterior pelvis probably due to adhesions.  Patient declined hospitalization and left AMA.  Patient has had 2 recent hospitalizations in June 6/5 to 6/9, and 6/2 to 6/4 both for small bowel obstruction.  Her SBO was managed conservatively both times.  At her last hospitalization, she was evaluated by general surgeon Dr. Michaell Cowing.  Recommendation was that if the patient required recurrent hospitalization for small bowel obstruction, patient be admitted/referred to [facility] as her surgeon are familiar with her at that facility.  Per Care Everywhere, patient had exploratory laparotomy with lysis of adhesions 11/08/2020 at [facility].  On my evaluation today, she tells me her symptoms have actually improved.  2 days ago she was vomiting 4 times, she vomited once yesterday and has not vomited today.  Improvement in abdominal pain.  She had a small bowel movement yesterday.  She is sitting at the side of the bed, appears comfortable and wearing her home cloths.  Spouse is also seated on the bed.  She otherwise has no complaints, no urinary symptoms.  Review of Systems: As stated in HPI.  Past Medical History: Past Medical History:  Diagnosis Date   Atrophic kidney    Cancer (HCC)    cervical 2003   Cervical cancer (HCC)    Colovaginal fistula    Pyelonephritis    Renal  atrophy, left 11/19/2022   Thyroid disease    Vertigo     Past Surgical History: Past Surgical History:  Procedure Laterality Date   ABDOMINAL ADHESION SURGERY  11/08/2020   Dr [name] - [facility]   ABDOMINAL HYSTERECTOMY  2003   [facility] - Dr [name] - for cervical cancer   BILE DUCT EXPLORATION  10/22/2003   CHOLECYSTECTOMY OPEN  10/22/2003   Dr Zachery Dakins - CBD repair w T tube   CYSTOSCOPY  2021   ILEOSTOMY  11/23/2018   ILEOSTOMY CLOSURE  04/2020   LOW ANTERIOR BOWEL RESECTION  11/23/2018   LAR/takedown colovaginal fistula/loop ileostomy  - [facility]     Allergies:   Allergies  Allergen Reactions   Barium-Containing Compounds     hives   Percocet [Oxycodone-Acetaminophen] Other (See Comments)    migraine      Social History:  reports that she has quit smoking. Her smoking use included cigarettes. She has never used smokeless tobacco. She reports that she does not currently use alcohol. She reports that she does not use drugs.   Family History: Family History  Problem Relation Age of Onset   Heart disease Father    Diabetes Father    Parkinson's disease Neg Hx     Physical Exam: Vitals:   01/08/23 1115 01/08/23 1345 01/08/23 1424 01/08/23 1441  BP: 119/65 124/64  120/62  Pulse: 61 60  65  Resp: 17 18  19   Temp:   97.7 F (36.5  C)   TempSrc:   Oral   SpO2: 97% 98%  97%    Constitutional: Alert and awake, oriented x3, not in any acute distress. Eyes: PERLA, EOMI,   ENMT: external ears and nose appear normal,             Lips appears normal,   Neck: neck appears normal, no masses,   CVS: Regular rhythm, no LE edema, normal pedal pulses  Respiratory:  Respiratory effort normal. No accessory muscle use.  Abdomen: soft, nondistended, well-healed scars from prior ex lap. Musculoskeletal: : no cyanosis, clubbing or edema noted bilaterally                      Neuro: Speech fluent, no facial asymmetry, moving all extremities spontaneously Psych: stable mood and affect,  mental status Skin: no rashes or lesions or ulcers,   Data reviewed:  I have personally reviewed following labs and imaging studies Labs:  CBC: Recent Labs  Lab 01/06/23 2007 01/08/23 1038  WBC 11.1* 6.8  NEUTROABS  --  5.1  HGB 15.9* 13.7  HCT 46.2* 39.8  MCV 92.4 93.0  PLT 212 162    Basic Metabolic Panel: Recent Labs  Lab 01/06/23 2007 01/08/23 1038  NA 138 139  K 4.9 4.0  CL 103 106  CO2 24 24  GLUCOSE 161* 108*  BUN 26* 36*  CREATININE 1.42* 1.38*  CALCIUM 10.1 9.2   GFR Estimated Creatinine Clearance: 31.7 mL/min (A) (by C-G formula based on SCr of 1.38 mg/dL (H)). Liver Function Tests: Recent Labs  Lab 01/06/23 2007 01/08/23 1038  AST 19 16  ALT 17 14  ALKPHOS 77 63  BILITOT 1.0 0.8  PROT 7.5 6.8  ALBUMIN 4.4 3.8   Recent Labs  Lab 01/06/23 2007 01/08/23 1038  LIPASE 26 24    Sepsis Labs Recent Labs  Lab 01/06/23 2007 01/08/23 1038  WBC 11.1* 6.8   Microbiology No results found for this or any previous visit (from the past 240 hour(s)).  Radiological Exams on Admission: DG Abd Portable 2 Views  Result Date: 01/08/2023 CLINICAL DATA:  Abdominal pain EXAM: PORTABLE ABDOMEN - 2 VIEW COMPARISON:  11/22/22 XR.  CT 01/06/23. FINDINGS: Gas seen in nondilated loops of colon. There is decreasing small bowel distention. Mild residual in the midabdomen. Scattered mild stool. Scattered vascular calcifications. Surgical clips in the pelvis and right upper quadrant. No obstruction or obvious free air. IMPRESSION: Decreasing small bowel distention. Few dilated loops in the midabdomen remain. Recommend continued follow-up Electronically Signed   By: Karen Kays M.D.   On: 01/08/2023 12:45   CT ABDOMEN PELVIS W CONTRAST  Result Date: 01/06/2023 CLINICAL DATA:  Nausea vomiting EXAM: CT ABDOMEN AND PELVIS WITH CONTRAST TECHNIQUE: Multidetector CT imaging of the abdomen and pelvis was performed using the standard protocol following bolus administration of  intravenous contrast. RADIATION DOSE REDUCTION: This exam was performed according to the departmental dose-optimization program which includes automated exposure control, adjustment of the mA and/or kV according to patient size and/or use of iterative reconstruction technique. CONTRAST:  OMNIPAQUE IOHEXOL 300 MG/ML  SOLN COMPARISON:  Radiograph 11/22/2022, CT 11/20/2022, 04/15/2022 FINDINGS: Lower chest: Lung bases demonstrate no acute airspace disease. Small hiatal hernia Hepatobiliary: No focal liver abnormality is seen. Status post cholecystectomy. No biliary dilatation. Pancreas: Unremarkable. No pancreatic ductal dilatation or surrounding inflammatory changes. Atrophic Spleen: Normal in size without focal abnormality. Adrenals/Urinary Tract: Right adrenal gland within normal limits. Stable 2 cm left  adrenal nodule compatible with adenoma. No imaging follow-up is recommended. Atrophic irregularly scarred left kidney. No hydronephrosis. The bladder is unremarkable. Stomach/Bowel: Mild to moderate fluid distension of the stomach. Multiple fluid-filled dilated loops of proximal and mid small bowel measuring up to 4.3 cm with transition to completely decompressed distal small bowel within the anterior pelvis, series 2, image 62 consistent with high-grade bowel obstruction. Fecalized appearance of the small bowel just upstream to the point of transition. No acute bowel wall thickening. Vascular/Lymphatic: Advanced aortic atherosclerosis. No aneurysm. No suspicious lymph nodes Reproductive: Hysterectomy. Some air in the vagina. No adnexal mass. Other: Chronic presacral soft tissue thickening and calcifications. No evidence for pelvic effusion or free air Musculoskeletal: No acute or suspicious osseous abnormality. IMPRESSION: 1. Findings consistent with high-grade small bowel obstruction with transition point in the anterior pelvis, probably due to adhesions. 2. Atrophic irregularly scarred left kidney. 3.  Aortic atherosclerosis. Aortic Atherosclerosis (ICD10-I70.0). Electronically Signed   By: Jasmine Pang M.D.   On: 01/06/2023 23:48    Impression/Recommendations Active Problems:   SBO (small bowel obstruction) (HCC) She reports improving abdominal pain and vomiting onset of symptoms.  X-ray today showing-  decrease in small bowel distension.  Few dilated loops in the midabdomen remain.  Recommend continued follow-up.   - No vomiting during ER stay.  She is comfortable, and well appearing, pain has improved. Creatine and lytes WNL.  - Had exploratory lap with lysis of adhesions 11/08/2020 at [facility]. - EDP  talked to Dr. Robyne Peers, recommended pain control, NG tube, IV fluids.  Not likely a surgical candidate. -I talked to patient extensively with spouse at bedside.  They do not want NG tube. They tell me they can do pain medications and hydrate at home by themselves. They want definitive therapy to prevent recurrence of SBO.  I have explained the recommendation from the surgeon at her prior hospitalization - that the patient be admitted or referred to [facility].  They tell me they are unaware of this recommendation and they have wasted their time coming here today. They ask specifically if she stays, how many days she will be hospitalized here- I have explained why I can't give an answer to that. At this time they have elected to be discharged and call [facility] and follow up with her doctor over there.  - They have declined admission for conservative management.  - I have communicated these to the EDP, who will determine disposition.   Thank you for this consultation.    Onnie Boer M.D. [facility] 01/08/2023, 5:42 PM

## 2023-01-08 NOTE — ED Provider Notes (Signed)
[place] EMERGENCY DEPARTMENT AT [facility] Provider Note   CSN: [id number] Arrival date & time: 01/08/23  1008     History  Chief Complaint  Patient presents with   Abdominal Pain    [name] is a 67 y.o. female.   Abdominal Pain Associated symptoms: nausea and vomiting   Associated symptoms: no chest pain, no dysuria, no fever and no shortness of breath        [name] is a 67 y.o. female with past medical history of multiple abdominal surgeries and SBO who presents to the Emergency Department complaining of continued abdominal pain nausea and vomiting.  Admitted into the hospital early June for SBO.  Seen here 2 days ago for abdominal discomfort and vomiting found to have high-grade bowel obstruction.  She was recommended for hospital admission and ultimately left AGAINST MEDICAL ADVICE.  Returns today due to continued abdominal discomfort and vomiting.  Denies fever states she had small bowel movement yesterday and has been on limited, mostly liquid diet at home  Home Medications Prior to Admission medications   Medication Sig Start Date End Date Taking? Authorizing Provider  amoxicillin-clavulanate (AUGMENTIN) 875-125 MG tablet Take 1 tablet by mouth 2 (two) times daily. 12/06/22   [name], DO  cetirizine (ZYRTEC ALLERGY) 10 MG tablet Take 1 tablet (10 mg total) by mouth daily. Patient taking differently: Take 10 mg by mouth daily as needed for allergies. 08/30/22   Fondaw, Rodrigo Ran, PA  fluticasone (FLONASE) 50 MCG/ACT nasal spray Place 2 sprays into both nostrils daily for 14 days. 08/30/22 11/17/22  Gailen Shelter, PA  levothyroxine (SYNTHROID) 25 MCG tablet Take 1 tablet (25 mcg total) by mouth daily. 07/17/22   [name], DO  Multiple Vitamin (MULTIVITAMIN WITH MINERALS) TABS tablet Take 1 tablet by mouth daily.    [provider]  polyethylene glycol (MIRALAX / GLYCOLAX) 17 g packet Take 17 g by mouth daily. 11/24/22  02/22/23  [name], Alvira Philips, DO  simvastatin (ZOCOR) 20 MG tablet Take 1 tablet (20 mg total) by mouth daily at 6 PM. 12/06/22   Delynn Flavin M, DO      Allergies    Barium-containing compounds and Percocet [oxycodone-acetaminophen]    Review of Systems   Review of Systems  Constitutional:  Positive for appetite change. Negative for fever.  Respiratory:  Negative for shortness of breath.   Cardiovascular:  Negative for chest pain.  Gastrointestinal:  Positive for abdominal pain, nausea and vomiting.  Genitourinary:  Negative for dysuria and flank pain.  Musculoskeletal:  Negative for back pain.  Skin:  Negative for rash.  Neurological:  Negative for weakness.    Physical Exam Updated Vital Signs BP 119/65 (BP Location: Left Arm)   Pulse 61   Temp 97.6 F (36.4 C) (Oral)   Resp 17   SpO2 97%  Physical Exam Vitals and nursing note reviewed.  Constitutional:      Appearance: She is well-developed.  HENT:     Mouth/Throat:     Mouth: Mucous membranes are moist.  Cardiovascular:     Rate and Rhythm: Normal rate and regular rhythm.  Abdominal:     General: Bowel sounds are decreased. There is no distension.     Palpations: Abdomen is soft.     Tenderness: There is abdominal tenderness. There is no guarding.     Comments: Mild tenderness to palpation lower abdomen left greater than right.  No guarding or rebound tenderness.  Abdomen is soft  Musculoskeletal:     Right lower leg: No edema.     Left lower leg: No edema.  Skin:    General: Skin is warm.     Capillary Refill: Capillary refill takes less than 2 seconds.  Neurological:     General: No focal deficit present.     Mental Status: She is alert.     Sensory: No sensory deficit.     Motor: No weakness.     ED Results / Procedures / Treatments   Labs (all labs ordered are listed, but only abnormal results are displayed) Labs Reviewed  COMPREHENSIVE METABOLIC PANEL - Abnormal; Notable for the following  components:      Result Value   Glucose, Bld 108 (*)    BUN 36 (*)    Creatinine, Ser 1.38 (*)    GFR, Estimated 42 (*)    All other components within normal limits  CBC WITH DIFFERENTIAL/PLATELET  LIPASE, BLOOD  URINALYSIS, ROUTINE W REFLEX MICROSCOPIC    EKG None  Radiology DG Abd Portable 2 Views  Result Date: 01/08/2023 CLINICAL DATA:  Abdominal pain EXAM: PORTABLE ABDOMEN - 2 VIEW COMPARISON:  11/22/22 XR.  CT 01/06/23. FINDINGS: Gas seen in nondilated loops of colon. There is decreasing small bowel distention. Mild residual in the midabdomen. Scattered mild stool. Scattered vascular calcifications. Surgical clips in the pelvis and right upper quadrant. No obstruction or obvious free air. IMPRESSION: Decreasing small bowel distention. Few dilated loops in the midabdomen remain. Recommend continued follow-up Electronically Signed   By: Karen Kays M.D.   On: 01/08/2023 12:45   CT ABDOMEN PELVIS W CONTRAST  Result Date: 01/06/2023 CLINICAL DATA:  Nausea vomiting EXAM: CT ABDOMEN AND PELVIS WITH CONTRAST TECHNIQUE: Multidetector CT imaging of the abdomen and pelvis was performed using the standard protocol following bolus administration of intravenous contrast. RADIATION DOSE REDUCTION: This exam was performed according to the departmental dose-optimization program which includes automated exposure control, adjustment of the mA and/or kV according to patient size and/or use of iterative reconstruction technique. CONTRAST:  OMNIPAQUE IOHEXOL 300 MG/ML  SOLN COMPARISON:  Radiograph 11/22/2022, CT 11/20/2022, 04/15/2022 FINDINGS: Lower chest: Lung bases demonstrate no acute airspace disease. Small hiatal hernia Hepatobiliary: No focal liver abnormality is seen. Status post cholecystectomy. No biliary dilatation. Pancreas: Unremarkable. No pancreatic ductal dilatation or surrounding inflammatory changes. Atrophic Spleen: Normal in size without focal abnormality. Adrenals/Urinary Tract: Right  adrenal gland within normal limits. Stable 2 cm left adrenal nodule compatible with adenoma. No imaging follow-up is recommended. Atrophic irregularly scarred left kidney. No hydronephrosis. The bladder is unremarkable. Stomach/Bowel: Mild to moderate fluid distension of the stomach. Multiple fluid-filled dilated loops of proximal and mid small bowel measuring up to 4.3 cm with transition to completely decompressed distal small bowel within the anterior pelvis, series 2, image 62 consistent with high-grade bowel obstruction. Fecalized appearance of the small bowel just upstream to the point of transition. No acute bowel wall thickening. Vascular/Lymphatic: Advanced aortic atherosclerosis. No aneurysm. No suspicious lymph nodes Reproductive: Hysterectomy. Some air in the vagina. No adnexal mass. Other: Chronic presacral soft tissue thickening and calcifications. No evidence for pelvic effusion or free air Musculoskeletal: No acute or suspicious osseous abnormality. IMPRESSION: 1. Findings consistent with high-grade small bowel obstruction with transition point in the anterior pelvis, probably due to adhesions. 2. Atrophic irregularly scarred left kidney. 3. Aortic atherosclerosis. Aortic Atherosclerosis (ICD10-I70.0). Electronically Signed   By: Jasmine Pang M.D.   On: 01/06/2023 23:48  Procedures Procedures    Medications Ordered in ED Medications  sodium chloride 0.9 % bolus 1,000 mL (1,000 mLs Intravenous Bolus 01/08/23 1113)  ondansetron (ZOFRAN) injection 4 mg (4 mg Intravenous Given 01/08/23 1113)  morphine (PF) 4 MG/ML injection 4 mg (4 mg Intravenous Given 01/08/23 1113)    ED Course/ Medical Decision Making/ A&P                             Medical Decision Making Patient here with continued abdominal pain nausea vomiting.  Seen here 2 days ago found to have SBO.  History of multiple abdominal surgeries and prior obstructions  Was recommended for hospital admission on previous visit but  patient left AGAINST MEDICAL ADVICE  Amount and/or Complexity of Data Reviewed Labs: ordered.    Details: Labs unremarkable Radiology: ordered.    Details: Abdominal series today shows decreasing small bowel distention few dilated loops in the mid abdomen remain Discussion of management or test interpretation with external provider(s): Pt with continued abdominal sx's and reported vomiting.  Found to have SBO on previous visit, admission recommended previously but left AMA.  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  Through shared decision making, patient agreeable to hospitalization today and would like to speak with surgeon.   On recheck here, patient states pain is improved after medication and IV fluids, no active vomiting during ER stay.  Tolerated small sips of fluids.  Consulted with general surgery, Dr. Robyne Peers who reviewed imaging from 2 days ago and labs today.  Recommends hospital admission with placement of NG tube and will see in consultation  After much discussion, patient agreeable to placement of NG tube and hospital admission.  Discussed findings with [facility] hospitalist, Dr. Vanice Sarah who saw patient.     Pt has been evaluated for same at [facility] in past.  She now prefers to follow up with her surgeon there as she feels her symptoms have been improving   U/A shows infection, pt informed and rx written for keflex.      Risk Prescription drug management.           Final Clinical Impression(s) / ED Diagnoses Final diagnoses:  None    Rx / DC Orders ED Discharge Orders     None         Rosey Bath 01/11/23 1302    Linwood Dibbles, MD 01/15/23 [phone]

## 2023-01-08 NOTE — ED Triage Notes (Signed)
Pt reports she was dx with SBO 2 days ago. Pt reports she is back today because there is no improvement in symptoms.

## 2023-01-08 NOTE — Assessment & Plan Note (Deleted)
She reports improving abdominal pain and vomiting onset of symptoms.  X-ray to today showing decrease in small bowel distention.  Few dilated loops in the midabdomen remain.  Recommend continued follow-up.  No vomiting during ER stay.  She is comfortable, pain has improved.  Had exploratory lap with lysis of additions 11/08/2020 at [facility]. - EDP  talked to Dr. Robyne Peers, recommended pain control, NG tube, IV fluids.  Not likely a surgical candidate. -I talked to patient extensively with spouse at bedside.  They do not want NG tube. They tell me they can do pain medications and hydrate at home by themselves. They want definitive therapy to prevent recurrence of SBO.  I have explained the recommendation from the surgeon at her prior hospitalization - that the patient be admitted or referred to [facility].  They tell me they are unaware of this recommendation and they have wasted their time coming here today. They ask specifically how many days she will be hospitalized here- I have explained why I can't give an answer to that. At the time they have elected to call [facility] and follow up with her doctor over there.  - They have declined admission for conservative management.  - I have communicated these to the EDP, who will determine disposition.

## 2023-01-08 NOTE — ED Notes (Signed)
Pt informed PA-C, Tammy Tripplett, that she does not want the NG tube and she wants to be discharged.

## 2023-01-14 ENCOUNTER — Telehealth (HOSPITAL_BASED_OUTPATIENT_CLINIC_OR_DEPARTMENT_OTHER): Payer: Self-pay | Admitting: *Deleted

## 2023-01-14 ENCOUNTER — Telehealth: Payer: Self-pay | Admitting: *Deleted

## 2023-01-14 NOTE — Progress Notes (Signed)
ED Antimicrobial Stewardship Positive Culture Follow Up   [name] is an 67 y.o. female who presented to [facility] on 01/08/2023 with a chief complaint of  Chief Complaint  Patient presents with   Abdominal Pain    Recent Results (from the past 720 hour(s))  Urine Culture     Status: Abnormal   Collection Time: 01/08/23  3:00 PM   Specimen: Urine, Clean Catch  Result Value Ref Range Status   Specimen Description   Final    URINE, CLEAN CATCH Performed at [facility], [address], [place], [place] [address]    Special Requests   Final    NONE Performed at [facility], [address] [place], [place] [address]    Culture >=100,000 COLONIES/mL ENTEROBACTER CLOACAE (A)  Final   Report Status 01/13/2023 FINAL  Final   Organism ID, Bacteria ENTEROBACTER CLOACAE (A)  Final      Susceptibility   Enterobacter cloacae - MIC*    CEFEPIME <=0.12 SENSITIVE Sensitive     CIPROFLOXACIN <=0.25 SENSITIVE Sensitive     GENTAMICIN <=1 SENSITIVE Sensitive     IMIPENEM 0.5 SENSITIVE Sensitive     NITROFURANTOIN 32 SENSITIVE Sensitive     TRIMETH/SULFA <=20 SENSITIVE Sensitive     PIP/TAZO <=4 SENSITIVE Sensitive     * >=100,000 COLONIES/mL ENTEROBACTER CLOACAE    [x]  Treated with cephalexin, organism resistant to prescribed antimicrobial  New antibiotic prescription: Flow Manager to call patient. If patient experiencing urinary symptoms (dysuria, frequency, flank pain, fever, etc.), then stop cephalexin. Start cipro 500mg  PO BID x 5 days. If patient not experiencing urinary symptoms, no further treatment indicated for asymptomatic bacteriuria.  ED Provider: Aline August, PA-C   [name] 01/14/2023, 7:54 AM Clinical Pharmacist Monday - Friday phone -  [phone] Saturday - Sunday phone - [phone]

## 2023-01-14 NOTE — Progress Notes (Signed)
  Care Coordination   Note   01/14/2023 Name: [name] MRN: [id number] DOB: Nov 15, 1955  [name] is a 67 y.o. year old female who sees Patient, [name] for primary care. I reached out to [name] by phone today to offer care coordination services.  [name] was given information about Care Coordination services today including:   The Care Coordination services include support from the care team which includes your Nurse Coordinator, Clinical Social Worker, or Pharmacist.  The Care Coordination team is here to help remove barriers to the health concerns and goals most important to you. Care Coordination services are voluntary, and the patient may decline or stop services at any time by request to their care team member.   Care Coordination Consent Status: Patient did not agree to participate in care coordination services at this time.  Follow up plan:  none indicated - pt says she is already working with Child psychotherapist. Was with [facility] and is re establishing pcp with [facility] scheduled in August 2024  Burman Nieves, [facility] Care Coordination Care Guide Direct Dial: [phone]    Encounter Outcome:  Pt. Refused

## 2023-01-14 NOTE — Telephone Encounter (Signed)
Symptom check.  Patient states she is being followed at [facility] and does not need further treatment,

## 2023-01-29 ENCOUNTER — Encounter: Payer: Medicare Other | Admitting: Physical Therapy

## 2023-01-30 DIAGNOSIS — N3289 Other specified disorders of bladder: Secondary | ICD-10-CM | POA: Diagnosis not present

## 2023-01-30 DIAGNOSIS — N3001 Acute cystitis with hematuria: Secondary | ICD-10-CM | POA: Diagnosis not present

## 2023-01-30 DIAGNOSIS — K219 Gastro-esophageal reflux disease without esophagitis: Secondary | ICD-10-CM | POA: Diagnosis not present

## 2023-01-30 DIAGNOSIS — U071 COVID-19: Secondary | ICD-10-CM | POA: Diagnosis not present

## 2023-01-30 DIAGNOSIS — R1084 Generalized abdominal pain: Secondary | ICD-10-CM | POA: Diagnosis not present

## 2023-01-30 DIAGNOSIS — G250 Essential tremor: Secondary | ICD-10-CM | POA: Diagnosis not present

## 2023-01-30 DIAGNOSIS — Z79899 Other long term (current) drug therapy: Secondary | ICD-10-CM | POA: Diagnosis not present

## 2023-01-30 DIAGNOSIS — E860 Dehydration: Secondary | ICD-10-CM | POA: Diagnosis not present

## 2023-01-30 DIAGNOSIS — K56600 Partial intestinal obstruction, unspecified as to cause: Secondary | ICD-10-CM | POA: Diagnosis not present

## 2023-01-30 DIAGNOSIS — E785 Hyperlipidemia, unspecified: Secondary | ICD-10-CM | POA: Diagnosis not present

## 2023-01-30 DIAGNOSIS — N1339 Other hydronephrosis: Secondary | ICD-10-CM | POA: Diagnosis not present

## 2023-01-30 DIAGNOSIS — N1832 Chronic kidney disease, stage 3b: Secondary | ICD-10-CM | POA: Diagnosis not present

## 2023-01-30 DIAGNOSIS — Z885 Allergy status to narcotic agent status: Secondary | ICD-10-CM | POA: Diagnosis not present

## 2023-01-30 DIAGNOSIS — K5651 Intestinal adhesions [bands], with partial obstruction: Secondary | ICD-10-CM | POA: Diagnosis not present

## 2023-01-30 DIAGNOSIS — G4700 Insomnia, unspecified: Secondary | ICD-10-CM | POA: Diagnosis not present

## 2023-01-30 DIAGNOSIS — Z87440 Personal history of urinary (tract) infections: Secondary | ICD-10-CM | POA: Diagnosis not present

## 2023-01-30 DIAGNOSIS — E861 Hypovolemia: Secondary | ICD-10-CM | POA: Diagnosis not present

## 2023-01-30 DIAGNOSIS — Z720 Tobacco use: Secondary | ICD-10-CM | POA: Diagnosis not present

## 2023-01-30 DIAGNOSIS — N3000 Acute cystitis without hematuria: Secondary | ICD-10-CM | POA: Diagnosis not present

## 2023-01-30 DIAGNOSIS — N1330 Unspecified hydronephrosis: Secondary | ICD-10-CM | POA: Diagnosis not present

## 2023-01-30 DIAGNOSIS — N3281 Overactive bladder: Secondary | ICD-10-CM | POA: Diagnosis not present

## 2023-01-30 DIAGNOSIS — Z888 Allergy status to other drugs, medicaments and biological substances status: Secondary | ICD-10-CM | POA: Diagnosis not present

## 2023-01-30 DIAGNOSIS — Z923 Personal history of irradiation: Secondary | ICD-10-CM | POA: Diagnosis not present

## 2023-01-30 DIAGNOSIS — E278 Other specified disorders of adrenal gland: Secondary | ICD-10-CM | POA: Diagnosis not present

## 2023-01-30 DIAGNOSIS — E871 Hypo-osmolality and hyponatremia: Secondary | ICD-10-CM | POA: Diagnosis not present

## 2023-01-30 DIAGNOSIS — E039 Hypothyroidism, unspecified: Secondary | ICD-10-CM | POA: Diagnosis not present

## 2023-02-01 DIAGNOSIS — N3001 Acute cystitis with hematuria: Secondary | ICD-10-CM | POA: Diagnosis not present

## 2023-02-01 DIAGNOSIS — N1330 Unspecified hydronephrosis: Secondary | ICD-10-CM | POA: Diagnosis not present

## 2023-02-01 DIAGNOSIS — U071 COVID-19: Secondary | ICD-10-CM | POA: Diagnosis not present

## 2023-02-01 DIAGNOSIS — K56600 Partial intestinal obstruction, unspecified as to cause: Secondary | ICD-10-CM | POA: Diagnosis not present

## 2023-02-02 DIAGNOSIS — N3001 Acute cystitis with hematuria: Secondary | ICD-10-CM | POA: Diagnosis not present

## 2023-02-03 DIAGNOSIS — N3001 Acute cystitis with hematuria: Secondary | ICD-10-CM | POA: Diagnosis not present

## 2023-02-11 DIAGNOSIS — K565 Intestinal adhesions [bands], unspecified as to partial versus complete obstruction: Secondary | ICD-10-CM | POA: Diagnosis not present

## 2023-02-11 DIAGNOSIS — Z713 Dietary counseling and surveillance: Secondary | ICD-10-CM | POA: Diagnosis not present

## 2023-02-11 DIAGNOSIS — R531 Weakness: Secondary | ICD-10-CM | POA: Diagnosis not present

## 2023-02-11 DIAGNOSIS — K56609 Unspecified intestinal obstruction, unspecified as to partial versus complete obstruction: Secondary | ICD-10-CM | POA: Diagnosis not present

## 2023-02-13 ENCOUNTER — Ambulatory Visit: Payer: Medicare Other | Admitting: Nurse Practitioner

## 2023-03-05 ENCOUNTER — Encounter: Payer: Medicare Other | Admitting: Physical Therapy

## 2023-03-07 DIAGNOSIS — N1330 Unspecified hydronephrosis: Secondary | ICD-10-CM | POA: Diagnosis not present

## 2023-03-07 DIAGNOSIS — R112 Nausea with vomiting, unspecified: Secondary | ICD-10-CM | POA: Diagnosis not present

## 2023-03-07 DIAGNOSIS — E785 Hyperlipidemia, unspecified: Secondary | ICD-10-CM | POA: Diagnosis not present

## 2023-03-07 DIAGNOSIS — R140 Abdominal distension (gaseous): Secondary | ICD-10-CM | POA: Diagnosis not present

## 2023-03-07 DIAGNOSIS — E8770 Fluid overload, unspecified: Secondary | ICD-10-CM | POA: Diagnosis not present

## 2023-03-07 DIAGNOSIS — D3A8 Other benign neuroendocrine tumors: Secondary | ICD-10-CM | POA: Diagnosis not present

## 2023-03-07 DIAGNOSIS — Z8719 Personal history of other diseases of the digestive system: Secondary | ICD-10-CM | POA: Diagnosis not present

## 2023-03-07 DIAGNOSIS — K5651 Intestinal adhesions [bands], with partial obstruction: Secondary | ICD-10-CM | POA: Diagnosis not present

## 2023-03-07 DIAGNOSIS — N135 Crossing vessel and stricture of ureter without hydronephrosis: Secondary | ICD-10-CM | POA: Diagnosis not present

## 2023-03-07 DIAGNOSIS — R1084 Generalized abdominal pain: Secondary | ICD-10-CM | POA: Diagnosis not present

## 2023-03-07 DIAGNOSIS — E46 Unspecified protein-calorie malnutrition: Secondary | ICD-10-CM | POA: Diagnosis not present

## 2023-03-07 DIAGNOSIS — Z4682 Encounter for fitting and adjustment of non-vascular catheter: Secondary | ICD-10-CM | POA: Diagnosis not present

## 2023-03-07 DIAGNOSIS — Z79899 Other long term (current) drug therapy: Secondary | ICD-10-CM | POA: Diagnosis not present

## 2023-03-07 DIAGNOSIS — Z87891 Personal history of nicotine dependence: Secondary | ICD-10-CM | POA: Diagnosis not present

## 2023-03-07 DIAGNOSIS — R188 Other ascites: Secondary | ICD-10-CM | POA: Diagnosis not present

## 2023-03-07 DIAGNOSIS — K30 Functional dyspepsia: Secondary | ICD-10-CM | POA: Diagnosis not present

## 2023-03-07 DIAGNOSIS — E43 Unspecified severe protein-calorie malnutrition: Secondary | ICD-10-CM | POA: Diagnosis not present

## 2023-03-07 DIAGNOSIS — K9049 Malabsorption due to intolerance, not elsewhere classified: Secondary | ICD-10-CM | POA: Diagnosis not present

## 2023-03-07 DIAGNOSIS — R0689 Other abnormalities of breathing: Secondary | ICD-10-CM | POA: Diagnosis not present

## 2023-03-07 DIAGNOSIS — G8918 Other acute postprocedural pain: Secondary | ICD-10-CM | POA: Diagnosis not present

## 2023-03-07 DIAGNOSIS — R739 Hyperglycemia, unspecified: Secondary | ICD-10-CM | POA: Diagnosis not present

## 2023-03-07 DIAGNOSIS — N136 Pyonephrosis: Secondary | ICD-10-CM | POA: Diagnosis not present

## 2023-03-07 DIAGNOSIS — Z4659 Encounter for fitting and adjustment of other gastrointestinal appliance and device: Secondary | ICD-10-CM | POA: Diagnosis not present

## 2023-03-07 DIAGNOSIS — G250 Essential tremor: Secondary | ICD-10-CM | POA: Diagnosis not present

## 2023-03-07 DIAGNOSIS — R1110 Vomiting, unspecified: Secondary | ICD-10-CM | POA: Diagnosis not present

## 2023-03-07 DIAGNOSIS — R000 Tachycardia, unspecified: Secondary | ICD-10-CM | POA: Diagnosis not present

## 2023-03-07 DIAGNOSIS — R079 Chest pain, unspecified: Secondary | ICD-10-CM | POA: Diagnosis not present

## 2023-03-07 DIAGNOSIS — Z681 Body mass index (BMI) 19 or less, adult: Secondary | ICD-10-CM | POA: Diagnosis not present

## 2023-03-07 DIAGNOSIS — R918 Other nonspecific abnormal finding of lung field: Secondary | ICD-10-CM | POA: Diagnosis not present

## 2023-03-07 DIAGNOSIS — M545 Low back pain, unspecified: Secondary | ICD-10-CM | POA: Diagnosis not present

## 2023-03-07 DIAGNOSIS — J90 Pleural effusion, not elsewhere classified: Secondary | ICD-10-CM | POA: Diagnosis not present

## 2023-03-07 DIAGNOSIS — R627 Adult failure to thrive: Secondary | ICD-10-CM | POA: Diagnosis not present

## 2023-03-07 DIAGNOSIS — R6510 Systemic inflammatory response syndrome (SIRS) of non-infectious origin without acute organ dysfunction: Secondary | ICD-10-CM | POA: Diagnosis not present

## 2023-03-07 DIAGNOSIS — R1111 Vomiting without nausea: Secondary | ICD-10-CM | POA: Diagnosis not present

## 2023-03-07 DIAGNOSIS — I499 Cardiac arrhythmia, unspecified: Secondary | ICD-10-CM | POA: Diagnosis not present

## 2023-03-07 DIAGNOSIS — R110 Nausea: Secondary | ICD-10-CM | POA: Diagnosis not present

## 2023-03-07 DIAGNOSIS — Z932 Ileostomy status: Secondary | ICD-10-CM | POA: Diagnosis not present

## 2023-03-07 DIAGNOSIS — K567 Ileus, unspecified: Secondary | ICD-10-CM | POA: Diagnosis not present

## 2023-03-07 DIAGNOSIS — K56609 Unspecified intestinal obstruction, unspecified as to partial versus complete obstruction: Secondary | ICD-10-CM | POA: Diagnosis not present

## 2023-03-07 DIAGNOSIS — R109 Unspecified abdominal pain: Secondary | ICD-10-CM | POA: Diagnosis not present

## 2023-03-07 DIAGNOSIS — J9811 Atelectasis: Secondary | ICD-10-CM | POA: Diagnosis not present

## 2023-03-07 DIAGNOSIS — E039 Hypothyroidism, unspecified: Secondary | ICD-10-CM | POA: Diagnosis not present

## 2023-03-07 DIAGNOSIS — R0602 Shortness of breath: Secondary | ICD-10-CM | POA: Diagnosis not present

## 2023-03-07 DIAGNOSIS — K9189 Other postprocedural complications and disorders of digestive system: Secondary | ICD-10-CM | POA: Diagnosis not present

## 2023-03-07 DIAGNOSIS — K6389 Other specified diseases of intestine: Secondary | ICD-10-CM | POA: Diagnosis not present

## 2023-03-07 DIAGNOSIS — J811 Chronic pulmonary edema: Secondary | ICD-10-CM | POA: Diagnosis not present

## 2023-03-08 DIAGNOSIS — R112 Nausea with vomiting, unspecified: Secondary | ICD-10-CM | POA: Diagnosis not present

## 2023-03-08 DIAGNOSIS — R109 Unspecified abdominal pain: Secondary | ICD-10-CM | POA: Diagnosis not present

## 2023-03-08 DIAGNOSIS — R140 Abdominal distension (gaseous): Secondary | ICD-10-CM | POA: Diagnosis not present

## 2023-03-09 DIAGNOSIS — R140 Abdominal distension (gaseous): Secondary | ICD-10-CM | POA: Diagnosis not present

## 2023-03-09 DIAGNOSIS — R112 Nausea with vomiting, unspecified: Secondary | ICD-10-CM | POA: Diagnosis not present

## 2023-03-09 DIAGNOSIS — R109 Unspecified abdominal pain: Secondary | ICD-10-CM | POA: Diagnosis not present

## 2023-03-10 DIAGNOSIS — K56609 Unspecified intestinal obstruction, unspecified as to partial versus complete obstruction: Secondary | ICD-10-CM | POA: Diagnosis not present

## 2023-03-11 DIAGNOSIS — R140 Abdominal distension (gaseous): Secondary | ICD-10-CM | POA: Diagnosis not present

## 2023-03-11 DIAGNOSIS — R109 Unspecified abdominal pain: Secondary | ICD-10-CM | POA: Diagnosis not present

## 2023-03-11 DIAGNOSIS — R112 Nausea with vomiting, unspecified: Secondary | ICD-10-CM | POA: Diagnosis not present

## 2023-03-12 DIAGNOSIS — R140 Abdominal distension (gaseous): Secondary | ICD-10-CM | POA: Diagnosis not present

## 2023-03-12 DIAGNOSIS — R112 Nausea with vomiting, unspecified: Secondary | ICD-10-CM | POA: Diagnosis not present

## 2023-03-14 DIAGNOSIS — R1084 Generalized abdominal pain: Secondary | ICD-10-CM | POA: Diagnosis not present

## 2023-03-14 DIAGNOSIS — Z4659 Encounter for fitting and adjustment of other gastrointestinal appliance and device: Secondary | ICD-10-CM | POA: Diagnosis not present

## 2023-03-14 DIAGNOSIS — G8918 Other acute postprocedural pain: Secondary | ICD-10-CM | POA: Diagnosis not present

## 2023-03-14 DIAGNOSIS — K6389 Other specified diseases of intestine: Secondary | ICD-10-CM | POA: Diagnosis not present

## 2023-03-14 DIAGNOSIS — K5651 Intestinal adhesions [bands], with partial obstruction: Secondary | ICD-10-CM | POA: Diagnosis not present

## 2023-03-15 DIAGNOSIS — R1084 Generalized abdominal pain: Secondary | ICD-10-CM | POA: Diagnosis not present

## 2023-03-18 DIAGNOSIS — R0602 Shortness of breath: Secondary | ICD-10-CM | POA: Diagnosis not present

## 2023-03-18 DIAGNOSIS — R140 Abdominal distension (gaseous): Secondary | ICD-10-CM | POA: Diagnosis not present

## 2023-03-18 DIAGNOSIS — R079 Chest pain, unspecified: Secondary | ICD-10-CM | POA: Diagnosis not present

## 2023-03-18 DIAGNOSIS — R1111 Vomiting without nausea: Secondary | ICD-10-CM | POA: Diagnosis not present

## 2023-03-19 DIAGNOSIS — R079 Chest pain, unspecified: Secondary | ICD-10-CM | POA: Diagnosis not present

## 2023-03-20 DIAGNOSIS — R110 Nausea: Secondary | ICD-10-CM | POA: Diagnosis not present

## 2023-03-20 DIAGNOSIS — R140 Abdominal distension (gaseous): Secondary | ICD-10-CM | POA: Diagnosis not present

## 2023-03-20 DIAGNOSIS — R079 Chest pain, unspecified: Secondary | ICD-10-CM | POA: Diagnosis not present

## 2023-03-20 DIAGNOSIS — R0602 Shortness of breath: Secondary | ICD-10-CM | POA: Diagnosis not present

## 2023-03-20 DIAGNOSIS — J90 Pleural effusion, not elsewhere classified: Secondary | ICD-10-CM | POA: Diagnosis not present

## 2023-03-22 DIAGNOSIS — R1111 Vomiting without nausea: Secondary | ICD-10-CM | POA: Diagnosis not present

## 2023-03-22 DIAGNOSIS — R1084 Generalized abdominal pain: Secondary | ICD-10-CM | POA: Diagnosis not present

## 2023-03-22 DIAGNOSIS — R140 Abdominal distension (gaseous): Secondary | ICD-10-CM | POA: Diagnosis not present

## 2023-03-22 DIAGNOSIS — Z4659 Encounter for fitting and adjustment of other gastrointestinal appliance and device: Secondary | ICD-10-CM | POA: Diagnosis not present

## 2023-03-23 DIAGNOSIS — R109 Unspecified abdominal pain: Secondary | ICD-10-CM | POA: Diagnosis not present

## 2023-03-24 DIAGNOSIS — R188 Other ascites: Secondary | ICD-10-CM | POA: Diagnosis not present

## 2023-03-25 DIAGNOSIS — R140 Abdominal distension (gaseous): Secondary | ICD-10-CM | POA: Diagnosis not present

## 2023-03-27 DIAGNOSIS — N1330 Unspecified hydronephrosis: Secondary | ICD-10-CM | POA: Diagnosis not present

## 2023-03-30 DIAGNOSIS — N1330 Unspecified hydronephrosis: Secondary | ICD-10-CM | POA: Diagnosis not present

## 2023-03-30 DIAGNOSIS — R140 Abdominal distension (gaseous): Secondary | ICD-10-CM | POA: Diagnosis not present

## 2023-03-30 DIAGNOSIS — Z4659 Encounter for fitting and adjustment of other gastrointestinal appliance and device: Secondary | ICD-10-CM | POA: Diagnosis not present

## 2023-03-30 DIAGNOSIS — R918 Other nonspecific abnormal finding of lung field: Secondary | ICD-10-CM | POA: Diagnosis not present

## 2023-03-31 DIAGNOSIS — Z4682 Encounter for fitting and adjustment of non-vascular catheter: Secondary | ICD-10-CM | POA: Diagnosis not present

## 2023-03-31 DIAGNOSIS — R1084 Generalized abdominal pain: Secondary | ICD-10-CM | POA: Diagnosis not present

## 2023-04-01 ENCOUNTER — Encounter: Payer: Self-pay | Admitting: General Practice

## 2023-04-01 DIAGNOSIS — R6510 Systemic inflammatory response syndrome (SIRS) of non-infectious origin without acute organ dysfunction: Secondary | ICD-10-CM | POA: Diagnosis not present

## 2023-04-01 DIAGNOSIS — J90 Pleural effusion, not elsewhere classified: Secondary | ICD-10-CM | POA: Diagnosis not present

## 2023-04-01 DIAGNOSIS — I499 Cardiac arrhythmia, unspecified: Secondary | ICD-10-CM | POA: Diagnosis not present

## 2023-04-01 DIAGNOSIS — N1330 Unspecified hydronephrosis: Secondary | ICD-10-CM | POA: Diagnosis not present

## 2023-04-01 DIAGNOSIS — R079 Chest pain, unspecified: Secondary | ICD-10-CM | POA: Diagnosis not present

## 2023-04-01 DIAGNOSIS — R000 Tachycardia, unspecified: Secondary | ICD-10-CM | POA: Diagnosis not present

## 2023-04-01 DIAGNOSIS — R1084 Generalized abdominal pain: Secondary | ICD-10-CM | POA: Diagnosis not present

## 2023-04-01 DIAGNOSIS — R627 Adult failure to thrive: Secondary | ICD-10-CM | POA: Diagnosis not present

## 2023-04-01 NOTE — Progress Notes (Signed)
error 

## 2023-04-02 DIAGNOSIS — R0689 Other abnormalities of breathing: Secondary | ICD-10-CM | POA: Diagnosis not present

## 2023-04-02 DIAGNOSIS — E039 Hypothyroidism, unspecified: Secondary | ICD-10-CM | POA: Diagnosis not present

## 2023-04-02 DIAGNOSIS — R739 Hyperglycemia, unspecified: Secondary | ICD-10-CM | POA: Diagnosis not present

## 2023-04-02 DIAGNOSIS — E46 Unspecified protein-calorie malnutrition: Secondary | ICD-10-CM | POA: Diagnosis not present

## 2023-04-02 DIAGNOSIS — N1330 Unspecified hydronephrosis: Secondary | ICD-10-CM | POA: Diagnosis not present

## 2023-04-02 DIAGNOSIS — Z87891 Personal history of nicotine dependence: Secondary | ICD-10-CM | POA: Diagnosis not present

## 2023-04-02 DIAGNOSIS — N135 Crossing vessel and stricture of ureter without hydronephrosis: Secondary | ICD-10-CM | POA: Diagnosis not present

## 2023-04-02 DIAGNOSIS — E785 Hyperlipidemia, unspecified: Secondary | ICD-10-CM | POA: Diagnosis not present

## 2023-04-04 IMAGING — MR MR BRAIN/TEMPORAL BONE/IAC
12 of 13 series · 40 of 48 positions shown · IV contrast (multihance)
Comparison: None.

EXAM:
MRI HEAD WITHOUT AND WITH CONTRAST
TECHNIQUE: Multiplanar, multiecho pulse sequences of the brain and surrounding
structures were obtained without and with intravenous contrast.

CONTRAST:  10mL MULTIHANCE GADOBENATE DIMEGLUMINE 529 MG/ML IV SOLN

[Series 2: T1 · sagittal · 5.0mm · 0.45mm/px · 3 of 23 slices shown (1 of 3)]
[im 1/23]
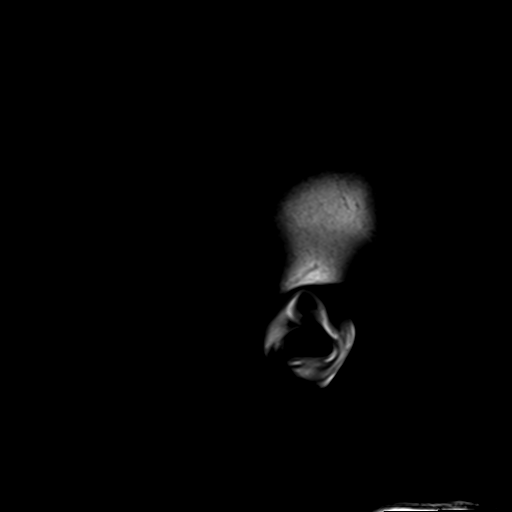
[im 12/23]
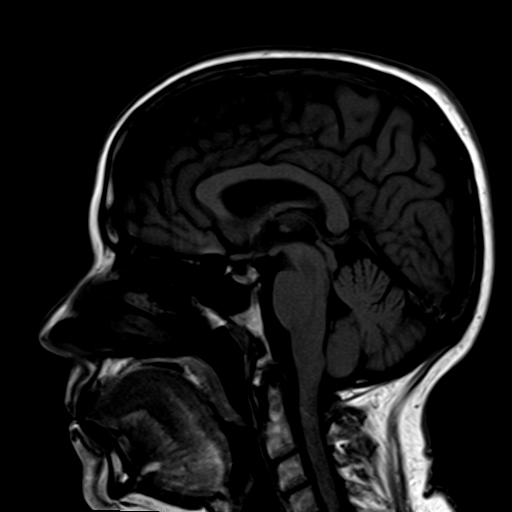
[im 23/23]
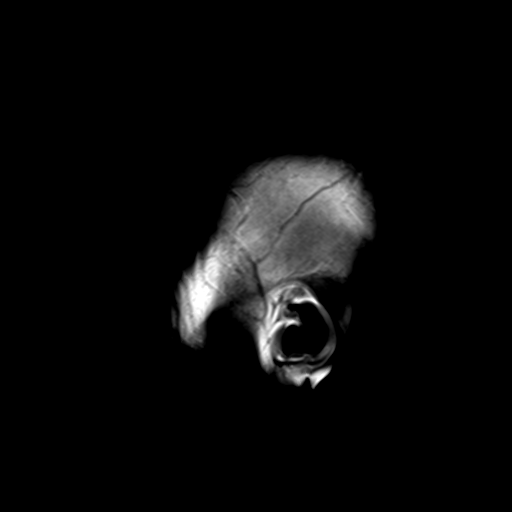

[Series 3: DWI · axial · 3.0mm · 1.80mm/px · z∈[-30,+115]mm · 11 of 99 slices shown (1 of 2)]
[im 1/99]
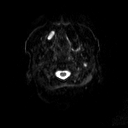
[im 10/99]
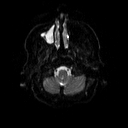
[im 20/99]
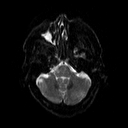
[im 30/99]
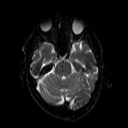
[im 40/99]
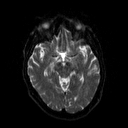
[im 50/99]
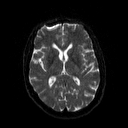
[im 59/99]
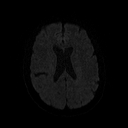
[im 69/99]
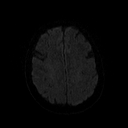
[im 79/99]
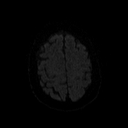
[im 89/99]
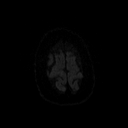
[im 99/99]
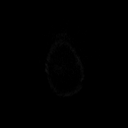

[Series 4: DWI · axial · 3.0mm · 1.80mm/px · z∈[-30,+115]mm · 5 of 50 slices shown (2 of 2)]
[im 1/50]
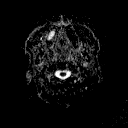
[im 13/50]
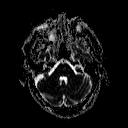
[im 25/50]
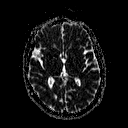
[im 37/50]
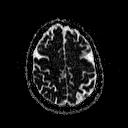
[im 50/50]
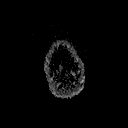

[Series 5: T2 · axial · 5.0mm · 0.45mm/px · z∈[-24,+116]mm · 2 of 23 slices shown]
[im 1/23]
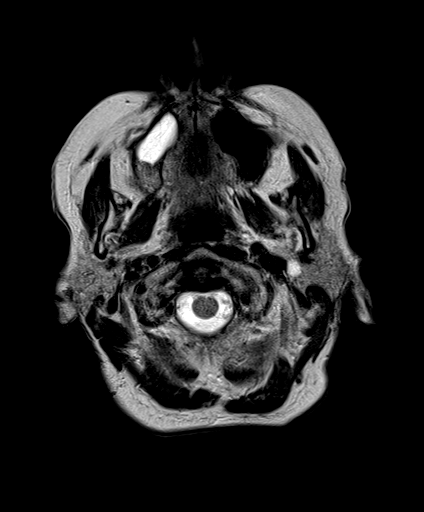
[im 23/23]
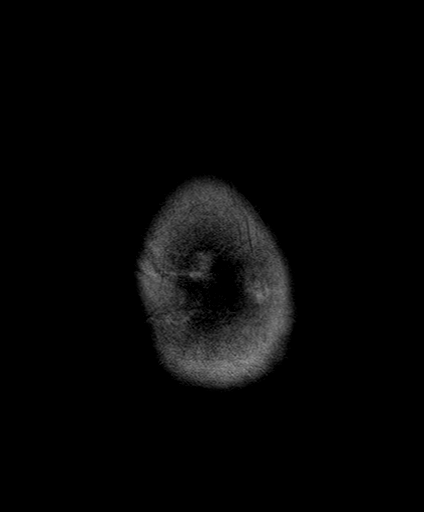

[Series 6: FLAIR · axial · 3.0mm · 0.45mm/px · z∈[-29,+124]mm · 3 of 27 slices shown]
[im 1/27]
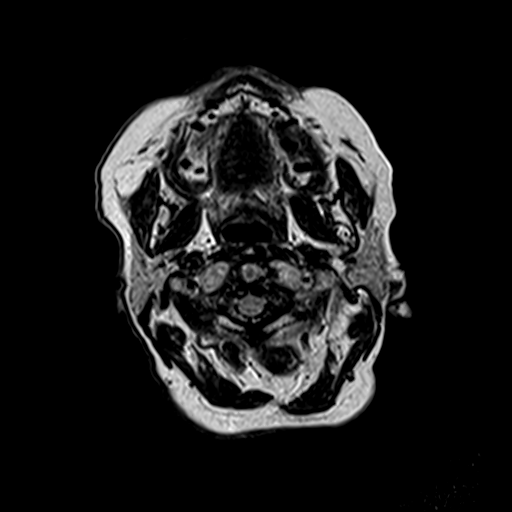
[im 14/27]
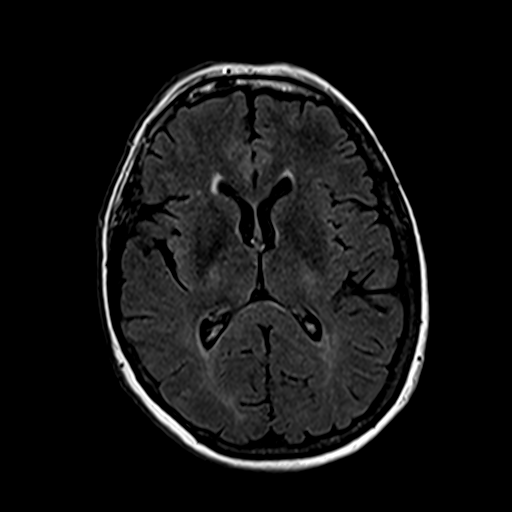
[im 27/27]
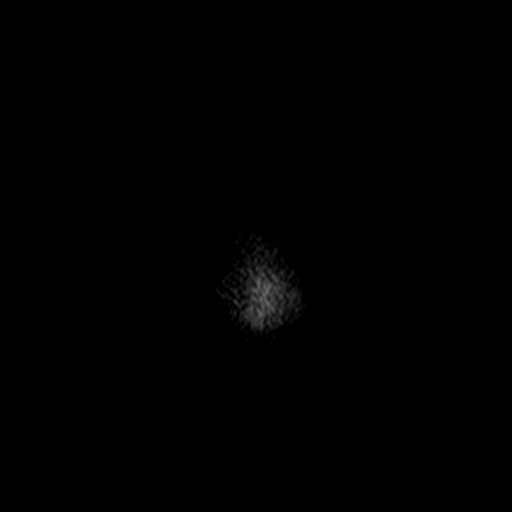

[Series 7: mip_images(sw) · axial · 24.0mm · 0.90mm/px · z∈[-13,+104]mm · 4 of 41 slices shown]
[im 1/41]
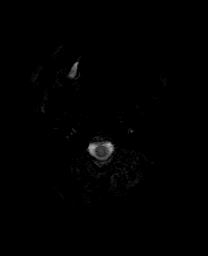
[im 14/41]
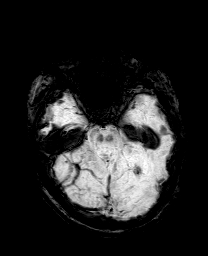
[im 27/41]
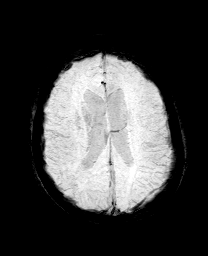
[im 41/41]
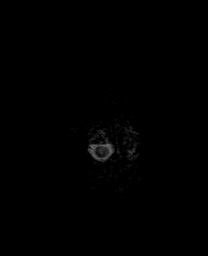

[Series 8: swi_images · axial · 3.0mm · 0.90mm/px · z∈[-23,+115]mm · 5 of 48 slices shown]
[im 1/48]
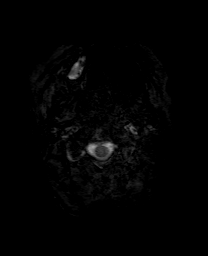
[im 12/48]
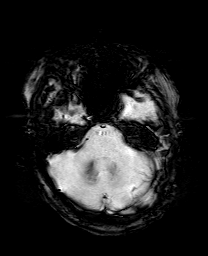
[im 24/48]
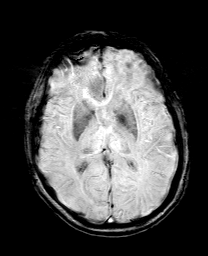
[im 36/48]
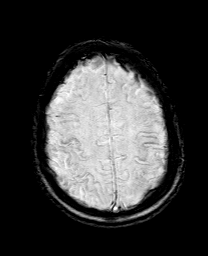
[im 48/48]
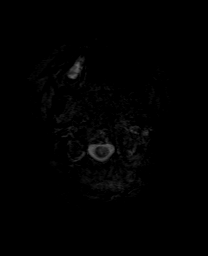

[Series 9: T1 · coronal · 3.0mm · 0.35mm/px · 1 of 11 slices shown (2 of 3)]
[im 1/11]
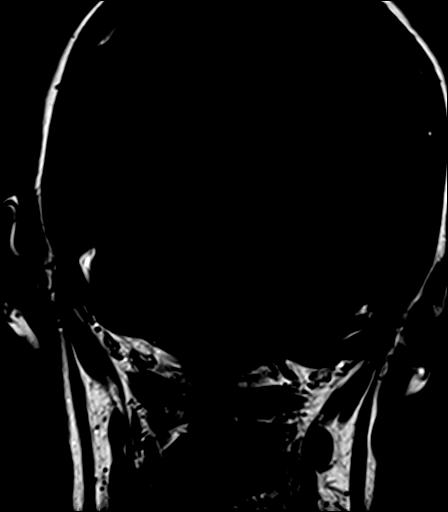

[Series 10: T1 · axial · 3.0mm · 0.35mm/px · 1 of 11 slices shown (3 of 3)]
[im 1/11]
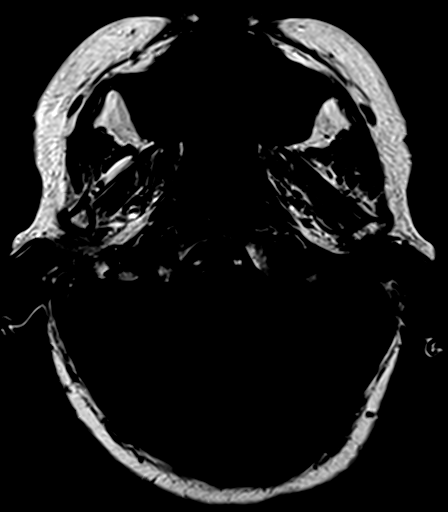

[Series 11: bSSFP · axial · 1.0mm · 0.28mm/px · z∈[-25,-2]mm · 3 of 36 slices shown]
[im 1/36]
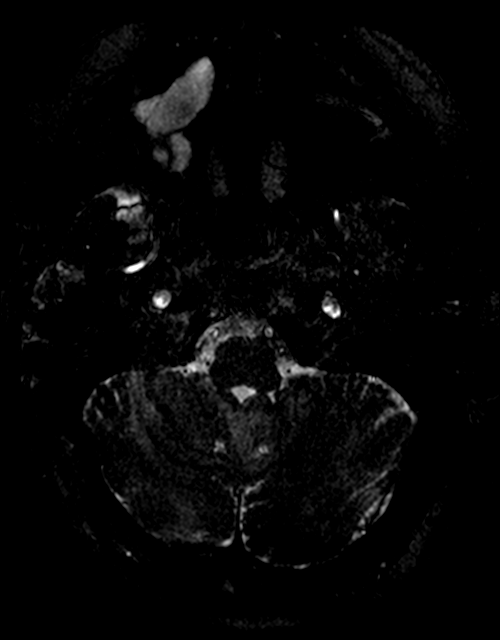
[im 12/36]
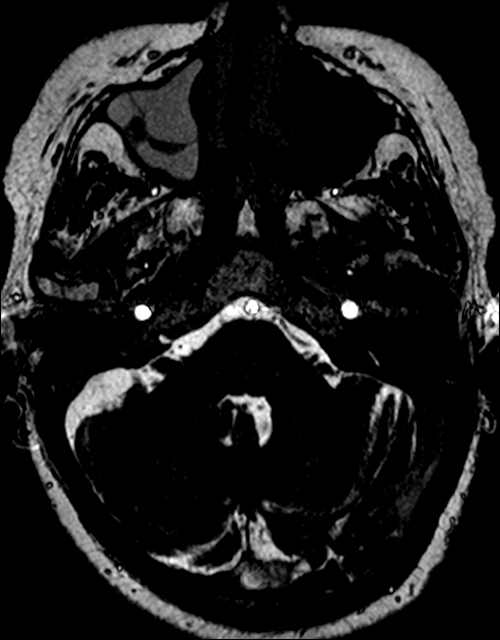
[im 24/36]
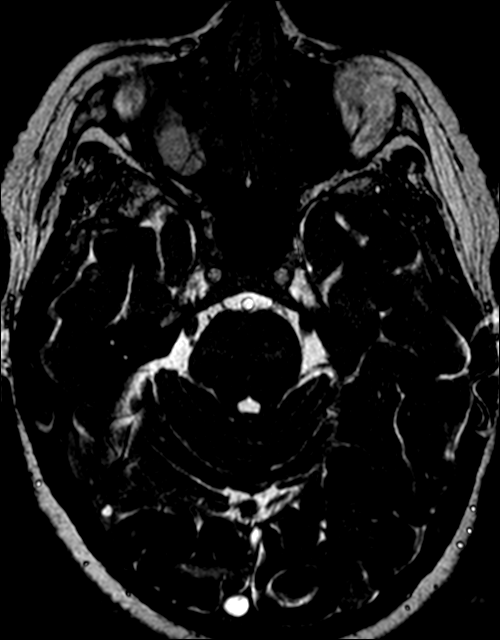

[Series 12: T1 post-contrast · coronal · 3.0mm · 0.35mm/px · 1 of 11 slices shown (1 of 2)]
[im 1/11]
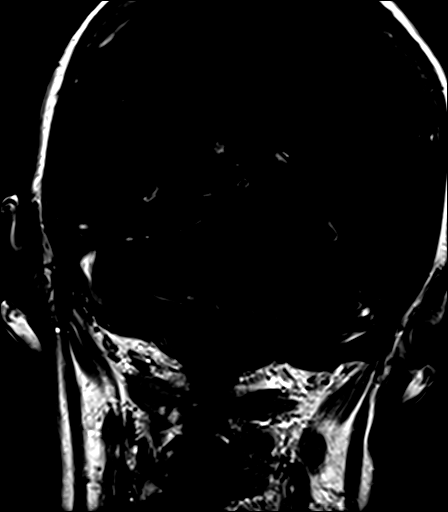

[Series 13: T1 post-contrast · axial · 3.0mm · 0.35mm/px · 1 of 11 slices shown (2 of 2)]
[im 1/11]
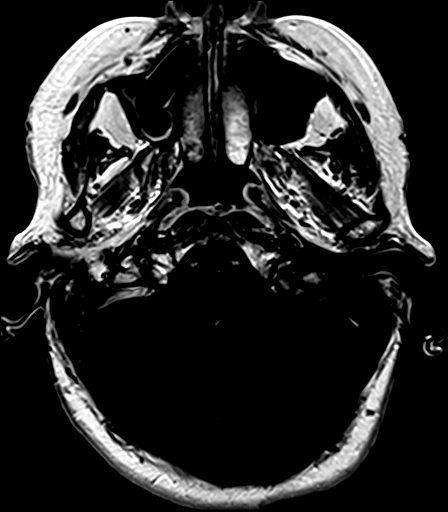

[40 of 48 positions shown; findings below may reference images not displayed]

FINDINGS: Brain: The brain has a normal appearance without evidence of
malformation, atrophy, old or acute small or large vessel
infarction, mass lesion, hemorrhage, hydrocephalus or extra-axial
collection.

CP angle regions are normal. Seventh and eighth nerve complexes
appear normal. No vestibular schwannoma or enhancing neuritis. No
regional vascular abnormality.

Vascular: Major vessels at the base of the brain show flow. Venous
sinuses appear patent.

Skull and upper cervical spine: Normal.

Sinuses/Orbits: Right maxillary sinus shows mucosal thickening. The
other paranasal sinuses are clear. The orbits are negative. There is
a small amount of fluid in the mastoid air cells on the right.

Other: None significant.
IMPRESSION: Normal appearance of the brain itself. No vestibular schwannoma or
enhancing neuritis. No other intracranial cause of the presenting
symptoms is identified.

Inflammatory change of the right maxillary sinus. The other
paranasal sinuses are clear. There is a small amount of fluid in a
few air cells at the mastoid tip on the right. It would be
surprising if this would relate to the clinical presentation.

## 2023-04-10 DIAGNOSIS — R1110 Vomiting, unspecified: Secondary | ICD-10-CM | POA: Diagnosis not present

## 2023-04-11 DIAGNOSIS — R627 Adult failure to thrive: Secondary | ICD-10-CM | POA: Diagnosis not present

## 2023-04-11 DIAGNOSIS — Z932 Ileostomy status: Secondary | ICD-10-CM | POA: Diagnosis not present

## 2023-04-11 DIAGNOSIS — K9049 Malabsorption due to intolerance, not elsewhere classified: Secondary | ICD-10-CM | POA: Diagnosis not present

## 2023-04-11 DIAGNOSIS — E43 Unspecified severe protein-calorie malnutrition: Secondary | ICD-10-CM | POA: Diagnosis not present

## 2023-04-13 DIAGNOSIS — R000 Tachycardia, unspecified: Secondary | ICD-10-CM | POA: Diagnosis not present

## 2023-04-13 DIAGNOSIS — R079 Chest pain, unspecified: Secondary | ICD-10-CM | POA: Diagnosis not present

## 2023-04-14 ENCOUNTER — Telehealth: Payer: Self-pay

## 2023-04-14 DIAGNOSIS — E43 Unspecified severe protein-calorie malnutrition: Secondary | ICD-10-CM | POA: Diagnosis not present

## 2023-04-14 DIAGNOSIS — Z932 Ileostomy status: Secondary | ICD-10-CM | POA: Diagnosis not present

## 2023-04-14 DIAGNOSIS — R627 Adult failure to thrive: Secondary | ICD-10-CM | POA: Diagnosis not present

## 2023-04-14 DIAGNOSIS — K9049 Malabsorption due to intolerance, not elsewhere classified: Secondary | ICD-10-CM | POA: Diagnosis not present

## 2023-04-14 NOTE — Patient Outreach (Signed)
Care Management  Transitions of Care Program Transitions of Care Post-discharge Initial    04/14/2023 Name: [name] MRN: [id number] DOB: 1955/11/09  Subjective: [name] is a 67 y.o. year old female who is a primary care patient of Patient, [name]. The Care Management team Engaged with patient Engaged with patient by telephone to assess and address transitions of care needs.   Consent to Services:  Patient was given information about care management services, agreed to services, and gave verbal consent to participate.   Assessment:  Date of Discharge: 04/11/23 Discharge Facility: Other (Non-[facility]) Type of Discharge: Inpatient Admission Primary Inpatient Discharge Diagnosis:: SBO  SDOH Interventions    Flowsheet Row Telephone from 04/14/2023 in [place] [facility] Telephone from 11/26/2022 in [facility] Care Coordination Care Coordination from 11/04/2022 in [facility] Care Coordination Office Visit from 09/23/2022 in [facility] Office Visit from 05/29/2022 in [facility]  SDOH Interventions       Food Insecurity Interventions Intervention Not Indicated Intervention Not Indicated Intervention Not Indicated -- --  Housing Interventions -- Intervention Not Indicated Intervention Not Indicated -- --  Transportation Interventions Intervention Not Indicated Intervention Not Indicated Intervention Not Indicated -- --  Utilities Interventions -- -- Intervention Not Indicated -- --  Depression Interventions/Treatment  -- -- -- Currently on Treatment PHQ2-9 Score <4 Follow-up Not Indicated  Financial Strain Interventions -- -- Intervention Not Indicated -- --  Stress Interventions -- -- Intervention Not Indicated -- --        Goals Addressed             This Visit's Progress    Patient Stated       Current Barriers:  Knowledge  Deficits related to plan of 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 call rescheduling needs Take all medications as prescribed Attend all scheduled provider appointments  Follow Up Plan:  Telephone follow up appointment with care management team member scheduled for:  November 5th          Plan: Telephone follow up appointment with care management team member scheduled for: Tuesday November 5th around 10am  Deidre Ala, RN RN Care Manager [facility] [phone]

## 2023-04-18 DIAGNOSIS — E785 Hyperlipidemia, unspecified: Secondary | ICD-10-CM | POA: Diagnosis not present

## 2023-04-18 DIAGNOSIS — K9049 Malabsorption due to intolerance, not elsewhere classified: Secondary | ICD-10-CM | POA: Diagnosis not present

## 2023-04-18 DIAGNOSIS — G250 Essential tremor: Secondary | ICD-10-CM | POA: Diagnosis not present

## 2023-04-18 DIAGNOSIS — E039 Hypothyroidism, unspecified: Secondary | ICD-10-CM | POA: Diagnosis not present

## 2023-04-18 DIAGNOSIS — E43 Unspecified severe protein-calorie malnutrition: Secondary | ICD-10-CM | POA: Diagnosis not present

## 2023-04-18 DIAGNOSIS — R627 Adult failure to thrive: Secondary | ICD-10-CM | POA: Diagnosis not present

## 2023-04-18 DIAGNOSIS — Z48815 Encounter for surgical aftercare following surgery on the digestive system: Secondary | ICD-10-CM | POA: Diagnosis not present

## 2023-04-18 DIAGNOSIS — Z932 Ileostomy status: Secondary | ICD-10-CM | POA: Diagnosis not present

## 2023-04-21 DIAGNOSIS — Z932 Ileostomy status: Secondary | ICD-10-CM | POA: Diagnosis not present

## 2023-04-21 DIAGNOSIS — R627 Adult failure to thrive: Secondary | ICD-10-CM | POA: Diagnosis not present

## 2023-04-21 DIAGNOSIS — K9049 Malabsorption due to intolerance, not elsewhere classified: Secondary | ICD-10-CM | POA: Diagnosis not present

## 2023-04-21 DIAGNOSIS — E43 Unspecified severe protein-calorie malnutrition: Secondary | ICD-10-CM | POA: Diagnosis not present

## 2023-04-22 ENCOUNTER — Other Ambulatory Visit: Payer: Self-pay

## 2023-04-22 DIAGNOSIS — E785 Hyperlipidemia, unspecified: Secondary | ICD-10-CM | POA: Diagnosis not present

## 2023-04-22 DIAGNOSIS — G250 Essential tremor: Secondary | ICD-10-CM | POA: Diagnosis not present

## 2023-04-22 DIAGNOSIS — Z48815 Encounter for surgical aftercare following surgery on the digestive system: Secondary | ICD-10-CM | POA: Diagnosis not present

## 2023-04-22 DIAGNOSIS — E039 Hypothyroidism, unspecified: Secondary | ICD-10-CM | POA: Diagnosis not present

## 2023-04-22 NOTE — Patient Outreach (Signed)
  Care Management  Transitions of Care Program Transitions of Care Post-discharge week 2  04/22/2023 Name: [name] MRN: [id number] DOB: 15-Oct-1955  Subjective: [name] is a 67 y.o. year old female who is a primary care patient of Patient, [name]. The Care Management team spoke with patient by telephone to assess and address transitions of care needs.   Plan: Additional outreach attempts will be made to reach the patient enrolled in the [facility] Program (Post Inpatient/ED Visit).  Deidre Ala, RN Medical illustrator [facility] [phone]

## 2023-04-23 DIAGNOSIS — G250 Essential tremor: Secondary | ICD-10-CM | POA: Diagnosis not present

## 2023-04-23 DIAGNOSIS — E785 Hyperlipidemia, unspecified: Secondary | ICD-10-CM | POA: Diagnosis not present

## 2023-04-23 DIAGNOSIS — E039 Hypothyroidism, unspecified: Secondary | ICD-10-CM | POA: Diagnosis not present

## 2023-04-23 DIAGNOSIS — Z48815 Encounter for surgical aftercare following surgery on the digestive system: Secondary | ICD-10-CM | POA: Diagnosis not present

## 2023-04-24 DIAGNOSIS — E43 Unspecified severe protein-calorie malnutrition: Secondary | ICD-10-CM | POA: Diagnosis not present

## 2023-04-25 ENCOUNTER — Ambulatory Visit: Payer: Medicare Other | Admitting: Nurse Practitioner

## 2023-04-25 DIAGNOSIS — R627 Adult failure to thrive: Secondary | ICD-10-CM | POA: Diagnosis not present

## 2023-04-25 DIAGNOSIS — Z932 Ileostomy status: Secondary | ICD-10-CM | POA: Diagnosis not present

## 2023-04-25 DIAGNOSIS — E860 Dehydration: Secondary | ICD-10-CM | POA: Diagnosis not present

## 2023-04-25 DIAGNOSIS — E43 Unspecified severe protein-calorie malnutrition: Secondary | ICD-10-CM | POA: Diagnosis not present

## 2023-04-25 DIAGNOSIS — K9049 Malabsorption due to intolerance, not elsewhere classified: Secondary | ICD-10-CM | POA: Diagnosis not present

## 2023-04-28 ENCOUNTER — Emergency Department (HOSPITAL_COMMUNITY): Payer: Medicare Other

## 2023-04-28 ENCOUNTER — Emergency Department (HOSPITAL_COMMUNITY)
Admission: EM | Admit: 2023-04-28 | Discharge: 2023-04-29 | Disposition: A | Discharge status: Other Acute Inpatient Hospital | Payer: Medicare Other | Attending: Student | Admitting: Student

## 2023-04-28 ENCOUNTER — Encounter (HOSPITAL_COMMUNITY): Payer: Self-pay | Admitting: Emergency Medicine

## 2023-04-28 ENCOUNTER — Other Ambulatory Visit: Payer: Self-pay

## 2023-04-28 DIAGNOSIS — Z79899 Other long term (current) drug therapy: Secondary | ICD-10-CM | POA: Insufficient documentation

## 2023-04-28 DIAGNOSIS — N281 Cyst of kidney, acquired: Secondary | ICD-10-CM | POA: Diagnosis not present

## 2023-04-28 DIAGNOSIS — E039 Hypothyroidism, unspecified: Secondary | ICD-10-CM | POA: Insufficient documentation

## 2023-04-28 DIAGNOSIS — R109 Unspecified abdominal pain: Secondary | ICD-10-CM | POA: Diagnosis not present

## 2023-04-28 DIAGNOSIS — R0602 Shortness of breath: Secondary | ICD-10-CM | POA: Diagnosis not present

## 2023-04-28 DIAGNOSIS — D72829 Elevated white blood cell count, unspecified: Secondary | ICD-10-CM | POA: Insufficient documentation

## 2023-04-28 DIAGNOSIS — Z8541 Personal history of malignant neoplasm of cervix uteri: Secondary | ICD-10-CM | POA: Insufficient documentation

## 2023-04-28 DIAGNOSIS — E875 Hyperkalemia: Secondary | ICD-10-CM | POA: Insufficient documentation

## 2023-04-28 DIAGNOSIS — Z87891 Personal history of nicotine dependence: Secondary | ICD-10-CM | POA: Diagnosis not present

## 2023-04-28 DIAGNOSIS — R911 Solitary pulmonary nodule: Secondary | ICD-10-CM | POA: Diagnosis not present

## 2023-04-28 DIAGNOSIS — N183 Chronic kidney disease, stage 3 unspecified: Secondary | ICD-10-CM | POA: Insufficient documentation

## 2023-04-28 DIAGNOSIS — E43 Unspecified severe protein-calorie malnutrition: Secondary | ICD-10-CM | POA: Diagnosis not present

## 2023-04-28 DIAGNOSIS — R627 Adult failure to thrive: Secondary | ICD-10-CM | POA: Diagnosis not present

## 2023-04-28 DIAGNOSIS — Z932 Ileostomy status: Secondary | ICD-10-CM | POA: Diagnosis not present

## 2023-04-28 DIAGNOSIS — N390 Urinary tract infection, site not specified: Secondary | ICD-10-CM | POA: Insufficient documentation

## 2023-04-28 DIAGNOSIS — R000 Tachycardia, unspecified: Secondary | ICD-10-CM | POA: Diagnosis not present

## 2023-04-28 DIAGNOSIS — K9049 Malabsorption due to intolerance, not elsewhere classified: Secondary | ICD-10-CM | POA: Diagnosis not present

## 2023-04-28 DIAGNOSIS — R7989 Other specified abnormal findings of blood chemistry: Secondary | ICD-10-CM | POA: Insufficient documentation

## 2023-04-28 DIAGNOSIS — R319 Hematuria, unspecified: Secondary | ICD-10-CM | POA: Diagnosis present

## 2023-04-28 DIAGNOSIS — N2889 Other specified disorders of kidney and ureter: Secondary | ICD-10-CM | POA: Diagnosis not present

## 2023-04-28 LAB — CBC WITH DIFFERENTIAL/PLATELET
Abs Immature Granulocytes: 0.31 10*3/uL — ABNORMAL HIGH (ref 0.00–0.07)
Basophils Absolute: 0.1 10*3/uL (ref 0.0–0.1)
Basophils Relative: 0 %
Eosinophils Absolute: 0.1 10*3/uL (ref 0.0–0.5)
Eosinophils Relative: 1 %
HCT: 29 % — ABNORMAL LOW (ref 36.0–46.0)
Hemoglobin: 9.4 g/dL — ABNORMAL LOW (ref 12.0–15.0)
Immature Granulocytes: 3 %
Lymphocytes Relative: 11 %
Lymphs Abs: 1.3 10*3/uL (ref 0.7–4.0)
MCH: 28.6 pg (ref 26.0–34.0)
MCHC: 32.4 g/dL (ref 30.0–36.0)
MCV: 88.1 fL (ref 80.0–100.0)
Monocytes Absolute: 0.7 10*3/uL (ref 0.1–1.0)
Monocytes Relative: 6 %
Neutro Abs: 9.5 10*3/uL — ABNORMAL HIGH (ref 1.7–7.7)
Neutrophils Relative %: 79 %
Platelets: 275 10*3/uL (ref 150–400)
RBC: 3.29 MIL/uL — ABNORMAL LOW (ref 3.87–5.11)
RDW: 17.2 % — ABNORMAL HIGH (ref 11.5–15.5)
WBC: 12 10*3/uL — ABNORMAL HIGH (ref 4.0–10.5)
nRBC: 0 % (ref 0.0–0.2)

## 2023-04-28 LAB — URINALYSIS, ROUTINE W REFLEX MICROSCOPIC
Bilirubin Urine: NEGATIVE
Glucose, UA: NEGATIVE mg/dL
Ketones, ur: NEGATIVE mg/dL
Nitrite: NEGATIVE
Protein, ur: 100 mg/dL — AB
RBC / HPF: >50 RBC/hpf (ref 0–5)
Specific Gravity, Urine: 1.01 (ref 1.005–1.030)
WBC, UA: >50 WBC/hpf (ref 0–5)
pH: 6 (ref 5.0–8.0)

## 2023-04-28 LAB — LACTIC ACID, PLASMA: Lactic Acid, Venous: 0.8 mmol/L (ref 0.5–1.9)

## 2023-04-28 MED ORDER — VANCOMYCIN HCL IN DEXTROSE 1-5 GM/200ML-% IV SOLN
1000.0000 mg | Freq: Once | INTRAVENOUS | Status: AC
Start: 2023-04-29 — End: 2023-04-29
  Administered 2023-04-29: 1000 mg via INTRAVENOUS
  Filled 2023-04-28: qty 200

## 2023-04-28 MED ORDER — SODIUM CHLORIDE 0.9 % IV SOLN
2.0000 g | Freq: Once | INTRAVENOUS | Status: AC
  Administered 2023-04-29: 2 g via INTRAVENOUS
  Filled 2023-04-28: qty 12.5

## 2023-04-28 MED ORDER — ALBUTEROL SULFATE HFA 108 (90 BASE) MCG/ACT IN AERS
2.0000 | INHALATION_SPRAY | RESPIRATORY_TRACT | Status: DC | PRN
Start: 2023-04-28 — End: 2023-04-29

## 2023-04-28 MED ORDER — LACTATED RINGERS IV BOLUS
1000.0000 mL | Freq: Once | INTRAVENOUS | Status: AC
  Administered 2023-04-29: 1000 mL via INTRAVENOUS

## 2023-04-28 NOTE — ED Triage Notes (Addendum)
Pt to ed from home with c/o low oxygen sat levels. Pt was seen by home heath nurse and was told oxygen levels were low and CO2 were off and to come to the ED to have labs drawn. Pts relative put them on their O2 machine for the ride to the ED d/t pt being SOB. Pt's O2 level is 100% in triage. Pt is shaking in triage d/t being cold. Pt has picc line in right arm for TPA administration at home. Pt's spouse also states pt has blood in urine

## 2023-04-28 NOTE — Progress Notes (Signed)
ED Pharmacy Antibiotic Sign Off An antibiotic consult was received from an ED provider for vancomycin per pharmacy dosing for sepsis. A chart review was completed to assess appropriateness.   The following one time order(s) were placed:  Vancomycin 1g x 1   Further antibiotic and/or antibiotic pharmacy consults should be ordered by the admitting provider if indicated.   Thank you for allowing pharmacy to be a part of this patient's care.   Marja Kays, [facility]  Clinical Pharmacist 04/28/23 11:45 PM

## 2023-04-29 ENCOUNTER — Other Ambulatory Visit: Payer: Self-pay

## 2023-04-29 DIAGNOSIS — N390 Urinary tract infection, site not specified: Secondary | ICD-10-CM | POA: Diagnosis not present

## 2023-04-29 DIAGNOSIS — D72829 Elevated white blood cell count, unspecified: Secondary | ICD-10-CM | POA: Diagnosis not present

## 2023-04-29 DIAGNOSIS — E8721 Acute metabolic acidosis: Secondary | ICD-10-CM | POA: Diagnosis not present

## 2023-04-29 DIAGNOSIS — J302 Other seasonal allergic rhinitis: Secondary | ICD-10-CM | POA: Diagnosis not present

## 2023-04-29 DIAGNOSIS — M6281 Muscle weakness (generalized): Secondary | ICD-10-CM | POA: Diagnosis not present

## 2023-04-29 DIAGNOSIS — Z743 Need for continuous supervision: Secondary | ICD-10-CM | POA: Diagnosis not present

## 2023-04-29 DIAGNOSIS — N138 Other obstructive and reflux uropathy: Secondary | ICD-10-CM | POA: Diagnosis not present

## 2023-04-29 DIAGNOSIS — M76892 Other specified enthesopathies of left lower limb, excluding foot: Secondary | ICD-10-CM | POA: Diagnosis not present

## 2023-04-29 DIAGNOSIS — N183 Chronic kidney disease, stage 3 unspecified: Secondary | ICD-10-CM | POA: Diagnosis not present

## 2023-04-29 DIAGNOSIS — M51369 Other intervertebral disc degeneration, lumbar region without mention of lumbar back pain or lower extremity pain: Secondary | ICD-10-CM | POA: Diagnosis not present

## 2023-04-29 DIAGNOSIS — E875 Hyperkalemia: Secondary | ICD-10-CM | POA: Diagnosis not present

## 2023-04-29 DIAGNOSIS — B9689 Other specified bacterial agents as the cause of diseases classified elsewhere: Secondary | ICD-10-CM | POA: Diagnosis not present

## 2023-04-29 DIAGNOSIS — R410 Disorientation, unspecified: Secondary | ICD-10-CM | POA: Diagnosis not present

## 2023-04-29 DIAGNOSIS — M1612 Unilateral primary osteoarthritis, left hip: Secondary | ICD-10-CM | POA: Diagnosis not present

## 2023-04-29 DIAGNOSIS — E876 Hypokalemia: Secondary | ICD-10-CM | POA: Diagnosis not present

## 2023-04-29 DIAGNOSIS — Z681 Body mass index (BMI) 19 or less, adult: Secondary | ICD-10-CM | POA: Diagnosis not present

## 2023-04-29 DIAGNOSIS — E782 Mixed hyperlipidemia: Secondary | ICD-10-CM | POA: Diagnosis not present

## 2023-04-29 DIAGNOSIS — D3502 Benign neoplasm of left adrenal gland: Secondary | ICD-10-CM | POA: Diagnosis not present

## 2023-04-29 DIAGNOSIS — T83122A Displacement of urinary stent, initial encounter: Secondary | ICD-10-CM | POA: Diagnosis not present

## 2023-04-29 DIAGNOSIS — Z79899 Other long term (current) drug therapy: Secondary | ICD-10-CM | POA: Diagnosis not present

## 2023-04-29 DIAGNOSIS — R310 Gross hematuria: Secondary | ICD-10-CM | POA: Diagnosis not present

## 2023-04-29 DIAGNOSIS — N179 Acute kidney failure, unspecified: Secondary | ICD-10-CM | POA: Diagnosis not present

## 2023-04-29 DIAGNOSIS — N136 Pyonephrosis: Secondary | ICD-10-CM | POA: Diagnosis not present

## 2023-04-29 DIAGNOSIS — Z936 Other artificial openings of urinary tract status: Secondary | ICD-10-CM | POA: Diagnosis not present

## 2023-04-29 DIAGNOSIS — Z960 Presence of urogenital implants: Secondary | ICD-10-CM | POA: Diagnosis not present

## 2023-04-29 DIAGNOSIS — N281 Cyst of kidney, acquired: Secondary | ICD-10-CM | POA: Diagnosis not present

## 2023-04-29 DIAGNOSIS — N261 Atrophy of kidney (terminal): Secondary | ICD-10-CM | POA: Diagnosis not present

## 2023-04-29 DIAGNOSIS — N2889 Other specified disorders of kidney and ureter: Secondary | ICD-10-CM | POA: Diagnosis not present

## 2023-04-29 DIAGNOSIS — G3184 Mild cognitive impairment, so stated: Secondary | ICD-10-CM | POA: Diagnosis not present

## 2023-04-29 DIAGNOSIS — I129 Hypertensive chronic kidney disease with stage 1 through stage 4 chronic kidney disease, or unspecified chronic kidney disease: Secondary | ICD-10-CM | POA: Diagnosis not present

## 2023-04-29 DIAGNOSIS — G9341 Metabolic encephalopathy: Secondary | ICD-10-CM | POA: Diagnosis not present

## 2023-04-29 DIAGNOSIS — I499 Cardiac arrhythmia, unspecified: Secondary | ICD-10-CM | POA: Diagnosis not present

## 2023-04-29 DIAGNOSIS — E43 Unspecified severe protein-calorie malnutrition: Secondary | ICD-10-CM | POA: Diagnosis not present

## 2023-04-29 DIAGNOSIS — E039 Hypothyroidism, unspecified: Secondary | ICD-10-CM | POA: Diagnosis not present

## 2023-04-29 DIAGNOSIS — Z932 Ileostomy status: Secondary | ICD-10-CM | POA: Diagnosis not present

## 2023-04-29 DIAGNOSIS — R739 Hyperglycemia, unspecified: Secondary | ICD-10-CM | POA: Diagnosis not present

## 2023-04-29 DIAGNOSIS — N1330 Unspecified hydronephrosis: Secondary | ICD-10-CM | POA: Diagnosis not present

## 2023-04-29 DIAGNOSIS — R7989 Other specified abnormal findings of blood chemistry: Secondary | ICD-10-CM | POA: Diagnosis not present

## 2023-04-29 DIAGNOSIS — B3749 Other urogenital candidiasis: Secondary | ICD-10-CM | POA: Diagnosis not present

## 2023-04-29 DIAGNOSIS — N1339 Other hydronephrosis: Secondary | ICD-10-CM | POA: Diagnosis not present

## 2023-04-29 DIAGNOSIS — I700 Atherosclerosis of aorta: Secondary | ICD-10-CM | POA: Diagnosis not present

## 2023-04-29 DIAGNOSIS — E785 Hyperlipidemia, unspecified: Secondary | ICD-10-CM | POA: Diagnosis not present

## 2023-04-29 DIAGNOSIS — R531 Weakness: Secondary | ICD-10-CM | POA: Diagnosis not present

## 2023-04-29 DIAGNOSIS — E869 Volume depletion, unspecified: Secondary | ICD-10-CM | POA: Diagnosis not present

## 2023-04-29 DIAGNOSIS — R109 Unspecified abdominal pain: Secondary | ICD-10-CM | POA: Diagnosis not present

## 2023-04-29 DIAGNOSIS — R911 Solitary pulmonary nodule: Secondary | ICD-10-CM | POA: Diagnosis not present

## 2023-04-29 DIAGNOSIS — D649 Anemia, unspecified: Secondary | ICD-10-CM | POA: Diagnosis not present

## 2023-04-29 DIAGNOSIS — D638 Anemia in other chronic diseases classified elsewhere: Secondary | ICD-10-CM | POA: Diagnosis not present

## 2023-04-29 DIAGNOSIS — R0602 Shortness of breath: Secondary | ICD-10-CM | POA: Diagnosis not present

## 2023-04-29 DIAGNOSIS — N189 Chronic kidney disease, unspecified: Secondary | ICD-10-CM | POA: Diagnosis not present

## 2023-04-29 DIAGNOSIS — R2689 Other abnormalities of gait and mobility: Secondary | ICD-10-CM | POA: Diagnosis not present

## 2023-04-29 DIAGNOSIS — Z23 Encounter for immunization: Secondary | ICD-10-CM | POA: Diagnosis not present

## 2023-04-29 DIAGNOSIS — N139 Obstructive and reflux uropathy, unspecified: Secondary | ICD-10-CM | POA: Diagnosis not present

## 2023-04-29 DIAGNOSIS — Z136 Encounter for screening for cardiovascular disorders: Secondary | ICD-10-CM | POA: Diagnosis not present

## 2023-04-29 DIAGNOSIS — R638 Other symptoms and signs concerning food and fluid intake: Secondary | ICD-10-CM | POA: Diagnosis not present

## 2023-04-29 DIAGNOSIS — Z515 Encounter for palliative care: Secondary | ICD-10-CM | POA: Diagnosis not present

## 2023-04-29 DIAGNOSIS — N1832 Chronic kidney disease, stage 3b: Secondary | ICD-10-CM | POA: Diagnosis not present

## 2023-04-29 DIAGNOSIS — E038 Other specified hypothyroidism: Secondary | ICD-10-CM | POA: Diagnosis not present

## 2023-04-29 DIAGNOSIS — Z87891 Personal history of nicotine dependence: Secondary | ICD-10-CM | POA: Diagnosis not present

## 2023-04-29 DIAGNOSIS — R519 Headache, unspecified: Secondary | ICD-10-CM | POA: Diagnosis not present

## 2023-04-29 DIAGNOSIS — E872 Acidosis, unspecified: Secondary | ICD-10-CM | POA: Diagnosis not present

## 2023-04-29 DIAGNOSIS — R627 Adult failure to thrive: Secondary | ICD-10-CM | POA: Diagnosis not present

## 2023-04-29 DIAGNOSIS — E871 Hypo-osmolality and hyponatremia: Secondary | ICD-10-CM | POA: Diagnosis not present

## 2023-04-29 DIAGNOSIS — R110 Nausea: Secondary | ICD-10-CM | POA: Diagnosis not present

## 2023-04-29 DIAGNOSIS — I10 Essential (primary) hypertension: Secondary | ICD-10-CM | POA: Diagnosis not present

## 2023-04-29 LAB — COMPREHENSIVE METABOLIC PANEL
ALT: 79 U/L — ABNORMAL HIGH (ref 0–44)
AST: 21 U/L (ref 15–41)
Albumin: 2.4 g/dL — ABNORMAL LOW (ref 3.5–5.0)
Alkaline Phosphatase: 229 U/L — ABNORMAL HIGH (ref 38–126)
Anion gap: 12 (ref 5–15)
BUN: 114 mg/dL — ABNORMAL HIGH (ref 8–23)
CO2: 11 mmol/L — ABNORMAL LOW (ref 22–32)
Calcium: 9.4 mg/dL (ref 8.9–10.3)
Chloride: 113 mmol/L — ABNORMAL HIGH (ref 98–111)
Creatinine, Ser: 2.44 mg/dL — ABNORMAL HIGH (ref 0.44–1.00)
GFR, Estimated: 21 mL/min — ABNORMAL LOW (ref >60)
Glucose, Bld: 250 mg/dL — ABNORMAL HIGH (ref 70–99)
Potassium: 4.8 mmol/L (ref 3.5–5.1)
Sodium: 136 mmol/L (ref 135–145)
Total Bilirubin: 0.4 mg/dL (ref <1.2)
Total Protein: 7.6 g/dL (ref 6.5–8.1)

## 2023-04-29 LAB — TROPONIN I (HIGH SENSITIVITY)
Troponin I (High Sensitivity): 13 ng/L (ref <18)
Troponin I (High Sensitivity): 14 ng/L (ref <18)

## 2023-04-29 LAB — BLOOD GAS, VENOUS
Acid-base deficit: 12.4 mmol/L — ABNORMAL HIGH (ref 0.0–2.0)
Bicarbonate: 12.5 mmol/L — ABNORMAL LOW (ref 20.0–28.0)
Drawn by: 62521
O2 Saturation: 29.2 %
Patient temperature: 36.7
pCO2, Ven: 26 mm[Hg] — ABNORMAL LOW (ref 44–60)
pH, Ven: 7.29 (ref 7.25–7.43)
pO2, Ven: <31 mm[Hg] — CL (ref 32–45)

## 2023-04-29 LAB — BRAIN NATRIURETIC PEPTIDE: B Natriuretic Peptide: 76 pg/mL (ref 0.0–100.0)

## 2023-04-29 MED ORDER — ONDANSETRON HCL 4 MG/2ML IJ SOLN
4.0000 mg | Freq: Once | INTRAMUSCULAR | Status: AC
  Administered 2023-04-29: 4 mg via INTRAVENOUS
  Filled 2023-04-29: qty 2

## 2023-04-29 MED ORDER — MORPHINE SULFATE (PF) 4 MG/ML IV SOLN
4.0000 mg | Freq: Once | INTRAVENOUS | Status: AC
  Administered 2023-04-29: 4 mg via INTRAVENOUS
  Filled 2023-04-29: qty 1

## 2023-04-29 NOTE — ED Provider Notes (Signed)
[place] EMERGENCY DEPARTMENT AT [facility] Provider Note  CSN: [id number] Arrival date & time: 04/28/23 2024  Chief Complaint(s) No chief complaint on file.  HPI [name] is a 67 y.o. female cervical cancer and rectovaginal fistula s/p redo low anterior resection with colovaginal fistula takedown, fistula repair, and hand sewn end to end sigmoidrectal anastomosis with loop ileostomy on 11/23/18; closure of loop ileostomy 05/01/20; and SBO s/p ex-lap with lysis of adhesions on 11/08/20, hospital admission on 03/07/2023 for failure to thrive, small bowel obstruction requiring OR for lysis of adhesions, hydronephrosis with right-sided stent placement and cystoscopy on 04/02/2023 who presents emergency room for evaluation of abnormal labs and failure to thrive.  Patient has been reportedly failing to thrive more than usual over the last few days and her home health team drew labs showing hyperkalemia, new azotemia and decreased bicarb recommending ER evaluation.  Nurse practitioner reportedly told the patient that she can present to any emergency department and does not need to return to [facility] to be seen by her primary providers and thus she arrives at [facility].  Here in the emergency room, patient arrives tachycardic and lethargic but will awaken to loud sounds and answer questions appropriately.  Husband states that he has been attempting to increase water intake at home but patient has a long history of difficulty tolerating p.o. and he has only been minimally successful.  Patient endorsing flank pain.   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   Seborrheic dermatitis 11/04/2022   Senile purpura (HCC) 11/04/2022   Telogen effluvium 11/04/2022   Neck pain 05/07/2022   Right ear pain 05/07/2022   Tympanic membrane perforation, marginal, right 02/27/2022   CKD (chronic kidney disease) stage 3, GFR 30-59 ml/min (HCC) 05/24/2021   Dysuria 04/16/2021   Vaginal irritation 04/16/2021   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   Imbalance 04/10/2021   Tinnitus aurium, right 04/10/2021   Non-recurrent acute serous otitis media of right ear 12/13/2020   Unspecified intestinal obstruction, unspecified as to partial versus complete obstruction (HCC) 10/27/2020   Constipation 10/25/2020   Nausea 10/25/2020   Hypothyroidism 06/24/2018   Smoker 06/24/2018   History of cervical cancer 06/24/2018   Mixed hyperlipidemia 06/24/2018   Essential tremor 05/08/2018   Chronic daily headache 05/06/2018   Lumbar spine painful on movement 05/06/2018   Vertigo 05/06/2018   Leg cramps 05/06/2018   Allergic rhinitis 04/09/2017   Anxiety 04/09/2017   Need for immunization against influenza 04/09/2017   Annual physical exam 04/09/2017   Home Medication(s) Prior to Admission medications   Medication Sig Start Date End Date Taking? Authorizing Provider  acetaminophen (TYLENOL) 500 MG tablet Take 1,000 mg by mouth every 6 (six) hours as needed.   Yes [provider]  levothyroxine (SYNTHROID) 25 MCG tablet Take 1 tablet (25 mcg total) by mouth daily. 07/17/22  Yes [name],  DO  mirtazapine (REMERON) 7.5 MG tablet Take 7.5 mg by mouth at bedtime.   Yes [provider]  promethazine (PHENERGAN) 12.5 MG tablet Take 12.5 mg by mouth every 6 (six) hours as needed for nausea or vomiting.   Yes [provider]  simvastatin  (ZOCOR) 20 MG tablet Take 1 tablet (20 mg total) by mouth daily at 6 PM. 12/06/22  Yes Gottschalk, Ashly M, DO  metoCLOPramide (REGLAN) 5 MG tablet Take 5 mg by mouth 4 (four) times daily. For 14 days. Stop November 9 Patient not taking: Reported on 04/28/2023    [provider]                                                                                                                                    Past Surgical History Past Surgical History:  Procedure Laterality Date   ABDOMINAL ADHESION SURGERY  11/08/2020   Dr [name] - [facility]   ABDOMINAL HYSTERECTOMY  2003   [facility] - Dr [name] - for cervical cancer   BILE DUCT EXPLORATION  10/22/2003   CHOLECYSTECTOMY OPEN  10/22/2003   Dr Zachery Dakins - CBD repair w T tube   CYSTOSCOPY  2021   ILEOSTOMY  11/23/2018   ILEOSTOMY CLOSURE  04/2020   LOW ANTERIOR BOWEL RESECTION  11/23/2018   LAR/takedown colovaginal fistula/loop ileostomy  - [facility]   Family History Family History  Problem Relation Age of Onset   Heart disease Father    Diabetes Father    Parkinson's disease Neg Hx     Social History Social History   Tobacco Use   Smoking status: Former    Current packs/day: 0.15    Types: Cigarettes   Smokeless tobacco: Never  Vaping Use   Vaping status: Never Used  Substance Use Topics   Alcohol use: Not Currently   Drug use: No   Allergies Barium-containing compounds and Percocet [oxycodone-acetaminophen]  Review of Systems Review of Systems  Gastrointestinal:  Positive for abdominal pain.  Genitourinary:  Positive for flank pain.    Physical Exam Vital Signs  I have reviewed the triage vital signs BP 127/68   Pulse 89   Temp 97.8 F (36.6 C) (Oral)   Resp 18   Ht 5\' 2"  (1.575 m)   Wt 48.1 kg   SpO2 99%   BMI 19.39 kg/m   Physical Exam Vitals and nursing note reviewed.  Constitutional:      General: She is not in acute distress.    Appearance: She is well-developed.  HENT:     Head: Normocephalic and  atraumatic.  Eyes:     Conjunctiva/sclera: Conjunctivae normal.  Cardiovascular:     Rate and Rhythm: Normal rate and regular rhythm.     Heart sounds: No murmur heard. Pulmonary:     Effort: Pulmonary effort is normal. No respiratory distress.     Breath sounds: Normal breath sounds.  Abdominal:  Palpations: Abdomen is soft.     Tenderness: There is abdominal tenderness.  Musculoskeletal:        General: No swelling.     Cervical back: Neck supple.  Skin:    General: Skin is warm and dry.     Capillary Refill: Capillary refill takes less than 2 seconds.  Neurological:     Mental Status: She is alert.  Psychiatric:        Mood and Affect: Mood normal.     ED Results and Treatments Labs (all labs ordered are listed, but only abnormal results are displayed) Labs Reviewed  URINALYSIS, ROUTINE W REFLEX MICROSCOPIC - Abnormal; Notable for the following components:      Result Value   APPearance CLOUDY (*)    Hgb urine dipstick LARGE (*)    Protein, ur 100 (*)    Leukocytes,Ua LARGE (*)    Bacteria, UA RARE (*)    All other components within normal limits  COMPREHENSIVE METABOLIC PANEL - Abnormal; Notable for the following components:   Chloride 113 (*)    CO2 11 (*)    Glucose, Bld 250 (*)    BUN 114 (*)    Creatinine, Ser 2.44 (*)    Albumin 2.4 (*)    ALT 79 (*)    Alkaline Phosphatase 229 (*)    GFR, Estimated 21 (*)    All other components within normal limits  CBC WITH DIFFERENTIAL/PLATELET - Abnormal; Notable for the following components:   WBC 12.0 (*)    RBC 3.29 (*)    Hemoglobin 9.4 (*)    HCT 29.0 (*)    RDW 17.2 (*)    Neutro Abs 9.5 (*)    Abs Immature Granulocytes 0.31 (*)    All other components within normal limits  BLOOD GAS, VENOUS - Abnormal; Notable for the following components:   pCO2, Ven 26 (*)    pO2, Ven <31 (*)    Bicarbonate 12.5 (*)    Acid-base deficit 12.4 (*)    All other components within normal limits  CULTURE, BLOOD  (ROUTINE X 2)  CULTURE, BLOOD (ROUTINE X 2)  URINE CULTURE  BRAIN NATRIURETIC PEPTIDE  LACTIC ACID, PLASMA  TROPONIN I (HIGH SENSITIVITY)  TROPONIN I (HIGH SENSITIVITY)                                                                                                                          Radiology CT CHEST ABDOMEN PELVIS WO CONTRAST  Result Date: 04/29/2023 CLINICAL DATA:  Shortness of breath and hematuria EXAM: CT CHEST, ABDOMEN AND PELVIS WITHOUT CONTRAST TECHNIQUE: Multidetector CT imaging of the chest, abdomen and pelvis was performed following the standard protocol without IV contrast. RADIATION DOSE REDUCTION: This exam was performed according to the departmental dose-optimization program which includes automated exposure control, adjustment of the mA and/or kV according to patient size and/or use of iterative reconstruction technique. COMPARISON:  None Available. FINDINGS: CT CHEST FINDINGS Cardiovascular: A right-sided PICC  line is seen. There is marked severity calcification of the thoracic aorta. The ascending thoracic aorta measures 3.4 cm in diameter. Normal heart size with marked severity coronary artery calcification. No pericardial effusion. Mediastinum/Nodes: No enlarged mediastinal, hilar, or axillary lymph nodes. A 1.3 cm thyroid nodule is seen within the right lobe of the thyroid gland. The trachea and esophagus demonstrate no significant findings. Lungs/Pleura: A 6 mm spiculated lung nodule is seen within the left apex (axial CT image 8, CT series 3). A 9 mm noncalcified left upper lobe lung nodule is also seen (axial CT image 20, CT series 3). A 3 mm noncalcified lung nodule is seen within the anterolateral aspect of the right upper lobe (axial CT image 29, CT series 3). There is no evidence of an acute infiltrate, pleural effusion or pneumothorax. Musculoskeletal: Multilevel degenerative changes seen throughout the thoracic spine. CT ABDOMEN PELVIS FINDINGS Hepatobiliary: No focal  liver abnormality is seen. Status post cholecystectomy. No biliary dilatation. Pancreas: Unremarkable. No pancreatic ductal dilatation or surrounding inflammatory changes. Spleen: Normal in size without focal abnormality. Adrenals/Urinary Tract: A stable 2.1 cm low-attenuation left adrenal mass is seen (approximately 6.58 Hounsfield units). The right adrenal gland is unremarkable. An atrophic left kidney is seen, with compensatory hypertrophy of the right kidney. A cluster of septated cysts is seen within the upper pole of the left kidney. A right-sided endo ureteral stent is in place, with its distal end seen near the right UPJ. Marked severity right-sided hydronephrosis is also noted to the level of the right UPJ. Marked severity left-sided hydronephrosis and proximal hydroureter is also seen without obstructing renal calculi. Air and fluid are seen within the lumen of a moderately distended urinary bladder. Stomach/Bowel: Stomach is within normal limits. The appendix is not clearly identified. Surgically anastomosed bowel is seen within the right lower quadrant. No evidence of bowel dilatation. The bowel loops within the lower abdomen and pelvis are predominantly decompressed and subsequently limited in evaluation. A right lower quadrant ostomy site is seen. This represents a new finding when compared to the prior study. Vascular/Lymphatic: Aortic atherosclerosis. No enlarged abdominal or pelvic lymph nodes. Reproductive: Status post hysterectomy. No adnexal masses. Other: No abdominopelvic ascites. Musculoskeletal: Multilevel degenerative changes seen throughout the lumbar spine. IMPRESSION: 1. Multiple bilateral upper lobe lung nodules, as described above. Non-contrast chest CT at 3-6 months is recommended. If the nodules are stable at time of repeat CT, then future CT at 18-24 months (from today's scan) is considered optional for low-risk patients, but is recommended for high-risk patients. This recommendation  follows the consensus statement: Guidelines for Management of Incidental Pulmonary Nodules Detected on CT Images: From the Fleischner Society 2017; Radiology 2017; 284:228-243. 2. Right-sided endo ureteral stent in place, with its distal end seen near the right UPJ. 3. Marked severity bilateral hydronephrosis and proximal hydroureter without obstructing renal calculi. 4. Atrophic left kidney with compensatory hypertrophy of the right kidney. 5. Stable left adrenal adenoma. 6. Evidence of prior cholecystectomy and hysterectomy. 7. Right lower quadrant ostomy site. 8. Aortic atherosclerosis. Aortic Atherosclerosis (ICD10-I70.0). Electronically Signed   By: Aram Candela M.D.   On: 04/29/2023 02:05   DG Chest 2 View  Result Date: 04/29/2023 CLINICAL DATA:  Shortness of breath. EXAM: CHEST - 2 VIEW COMPARISON:  June 19, 2020 FINDINGS: A right-sided venous catheter is seen with its distal tip noted just beyond the junction of the superior vena cava and right atrium. The heart size and mediastinal contours are within normal limits. There  is mild to moderate severity calcification of the aortic arch. Both lungs are clear. Radiopaque surgical clips are seen within the right upper quadrant. The visualized skeletal structures are unremarkable. IMPRESSION: Right-sided venous catheter positioning, as described above, without acute or active cardiopulmonary disease. Electronically Signed   By: Aram Candela M.D.   On: 04/29/2023 00:33    Pertinent labs & imaging results that were available during my care of the patient were reviewed by me and considered in my medical decision making (see MDM for details).  Medications Ordered in ED Medications  albuterol (VENTOLIN HFA) 108 (90 Base) MCG/ACT inhaler 2 puff (has no administration in time range)  ceFEPIme (MAXIPIME) 2 g in sodium chloride 0.9 % 100 mL IVPB (0 g Intravenous Stopped 04/29/23 0113)  lactated ringers bolus 1,000 mL (0 mLs Intravenous Stopped  04/29/23 0217)  vancomycin (VANCOCIN) IVPB 1000 mg/200 mL premix (0 mg Intravenous Stopped 04/29/23 0217)  morphine (PF) 4 MG/ML injection 4 mg (4 mg Intravenous Given 04/29/23 0512)  ondansetron (ZOFRAN) injection 4 mg (4 mg Intravenous Given 04/29/23 0510)                                                                                                                                     Procedures .Critical Care  Performed by: Glendora Score, MD Authorized by: Glendora Score, MD   Critical care provider statement:    Critical care time (minutes):  30   Critical care was necessary to treat or prevent imminent or life-threatening deterioration of the following conditions:  Dehydration and renal failure   Critical care was time spent personally by me on the following activities:  Development of treatment plan with patient or surrogate, discussions with consultants, evaluation of patient's response to treatment, examination of patient, ordering and review of laboratory studies, ordering and review of radiographic studies, ordering and performing treatments and interventions, pulse oximetry, re-evaluation of patient's condition and review of old charts   (including critical care time)  Medical Decision Making / ED Course   This patient presents to the ED for concern of abnormal labs, flank pain, failure to thrive, this involves an extensive number of treatment options, and is a complaint that carries with it a high risk of complications and morbidity.  The differential diagnosis includes dehydration, electrolyte abnormality, obstructive uropathy, prerenal AKI, UTI, sepsis, bacteremia, stent failure  MDM: Patient seen emergency room for evaluation of multiple complaints described above.  Physical exam reveals a cachectic patient with generalized abdominal pain, ostomy in place with no evidence of surrounding infection, mild tachycardia.  Laboratory evaluation concerning with a new AKI with BUN  114, creatinine 2.44, CO2 11, ALT 79, alk phos 229, leukocytosis to 12.0, pH 7.29.  Blood cultures obtained but initial lactic acid normal.  Urinalysis concerning for infection and patient started on broad-spectrum antibiotics.  CT chest and pelvis showing multiple bilateral upper lobe lung nodules, right ureteral stent  in place, marked bilateral hydronephrosis and proximal hydroureter but is otherwise unremarkable.  I spoke with the urologist on-call at [facility] who is recommending transfer to higher level of care for urology evaluation, possible nephrostomy tube placement.  Is recommending medical admission and due to patient's other comorbidities this is an appropriate plan.  I spoke with the hospitalist at [facility] Dr. Mal Amabile as this facility is the only facility with stepdown beds at this time who accepts the patient in transfer.  Vital signs improving with fluid resuscitation.  Patient transferred to [facility].   Additional history obtained: -Additional history obtained from husband -External records from outside source obtained and reviewed including: Chart review including previous notes, labs, imaging, consultation notes   Lab Tests: -I ordered, reviewed, and interpreted labs.   The pertinent results include:   Labs Reviewed  URINALYSIS, ROUTINE W REFLEX MICROSCOPIC - Abnormal; Notable for the following components:      Result Value   APPearance CLOUDY (*)    Hgb urine dipstick LARGE (*)    Protein, ur 100 (*)    Leukocytes,Ua LARGE (*)    Bacteria, UA RARE (*)    All other components within normal limits  COMPREHENSIVE METABOLIC PANEL - Abnormal; Notable for the following components:   Chloride 113 (*)    CO2 11 (*)    Glucose, Bld 250 (*)    BUN 114 (*)    Creatinine, Ser 2.44 (*)    Albumin 2.4 (*)    ALT 79 (*)    Alkaline Phosphatase 229 (*)    GFR, Estimated 21 (*)    All other components within normal limits  CBC WITH DIFFERENTIAL/PLATELET -  Abnormal; Notable for the following components:   WBC 12.0 (*)    RBC 3.29 (*)    Hemoglobin 9.4 (*)    HCT 29.0 (*)    RDW 17.2 (*)    Neutro Abs 9.5 (*)    Abs Immature Granulocytes 0.31 (*)    All other components within normal limits  BLOOD GAS, VENOUS - Abnormal; Notable for the following components:   pCO2, Ven 26 (*)    pO2, Ven <31 (*)    Bicarbonate 12.5 (*)    Acid-base deficit 12.4 (*)    All other components within normal limits  CULTURE, BLOOD (ROUTINE X 2)  CULTURE, BLOOD (ROUTINE X 2)  URINE CULTURE  BRAIN NATRIURETIC PEPTIDE  LACTIC ACID, PLASMA  TROPONIN I (HIGH SENSITIVITY)  TROPONIN I (HIGH SENSITIVITY)      EKG   EKG Interpretation Date/Time:  Monday April 28 2023 21:03:54 EST Ventricular Rate:  101 PR Interval:    QRS Duration:  77 QT Interval:  328 QTC Calculation: 426 R Axis:   47  Text Interpretation: Normal sinus rhythm Confirmed by Kieren Adkison ([id number]) on 04/29/2023 5:55:58 AM         Imaging Studies ordered: I ordered imaging studies including chest x-ray, CT chest abdomen pelvis I independently visualized and interpreted imaging. I agree with the radiologist interpretation   Medicines ordered and prescription drug management: Meds ordered this encounter  Medications   albuterol (VENTOLIN HFA) 108 (90 Base) MCG/ACT inhaler 2 puff   ceFEPIme (MAXIPIME) 2 g in sodium chloride 0.9 % 100 mL IVPB   lactated ringers bolus 1,000 mL   vancomycin (VANCOCIN) IVPB 1000 mg/200 mL premix    Order Specific Question:   Indication:    Answer:   Sepsis   morphine (PF) 4 MG/ML  injection 4 mg   ondansetron (ZOFRAN) injection 4 mg    -I have reviewed the patients home medicines and have made adjustments as needed  Critical interventions Fluid resuscitation, broad-spectrum antibiotics, transfer to higher level care  Consultations Obtained: I requested consultation with the urologist on-call at [facility],  and discussed lab and  imaging findings as well as pertinent plan - they recommend: Transfer to higher level of care, urology evaluation at [facility] or [facility] facility   Cardiac Monitoring: The patient was maintained on a cardiac monitor.  I personally viewed and interpreted the cardiac monitored which showed an underlying rhythm of: NSR, sinus tachycardia  Social Determinants of Health:  Factors impacting patients care include: none   Reevaluation: After the interventions noted above, I reevaluated the patient and found that they have :improved  Co morbidities that complicate the patient evaluation  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   Thyroid disease    Vertigo       Dispostion: I considered admission for this patient, and due to new azotemia and AKI with UTI patient require hospital admission     Final Clinical Impression(s) / ED Diagnoses Final diagnoses:  Azotemia  Urinary tract infection with hematuria, site unspecified     @PCDICTATION @    Glendora Score, MD 04/29/23 [phone]

## 2023-04-29 NOTE — ED Notes (Signed)
Patient transported to CT 

## 2023-04-29 NOTE — ED Notes (Signed)
ED Provider at bedside. 

## 2023-04-29 NOTE — Patient Outreach (Signed)
The patient is discharged from the [facility] program because she is being admitted to the hospital from home. Her SN went to the house and the patient had low Oxygen sat levels and UTI. She needs possible Nephrostomy tube placement. TOC to follow when discharged from the hospital.  Deidre Ala, RN RN Care Manager [facility] [phone]

## 2023-04-30 DIAGNOSIS — N139 Obstructive and reflux uropathy, unspecified: Secondary | ICD-10-CM | POA: Diagnosis not present

## 2023-04-30 DIAGNOSIS — Z515 Encounter for palliative care: Secondary | ICD-10-CM | POA: Diagnosis not present

## 2023-04-30 LAB — URINE CULTURE: Culture: >=100000 — AB

## 2023-05-01 DIAGNOSIS — N179 Acute kidney failure, unspecified: Secondary | ICD-10-CM | POA: Diagnosis not present

## 2023-05-01 DIAGNOSIS — N1330 Unspecified hydronephrosis: Secondary | ICD-10-CM | POA: Diagnosis not present

## 2023-05-01 DIAGNOSIS — N261 Atrophy of kidney (terminal): Secondary | ICD-10-CM | POA: Diagnosis not present

## 2023-05-02 DIAGNOSIS — N261 Atrophy of kidney (terminal): Secondary | ICD-10-CM | POA: Diagnosis not present

## 2023-05-02 DIAGNOSIS — N1339 Other hydronephrosis: Secondary | ICD-10-CM | POA: Diagnosis not present

## 2023-05-02 DIAGNOSIS — Z960 Presence of urogenital implants: Secondary | ICD-10-CM | POA: Diagnosis not present

## 2023-05-02 DIAGNOSIS — N179 Acute kidney failure, unspecified: Secondary | ICD-10-CM | POA: Diagnosis not present

## 2023-05-03 DIAGNOSIS — N179 Acute kidney failure, unspecified: Secondary | ICD-10-CM | POA: Diagnosis not present

## 2023-05-03 DIAGNOSIS — N139 Obstructive and reflux uropathy, unspecified: Secondary | ICD-10-CM | POA: Diagnosis not present

## 2023-05-03 DIAGNOSIS — D649 Anemia, unspecified: Secondary | ICD-10-CM | POA: Diagnosis not present

## 2023-05-03 DIAGNOSIS — I10 Essential (primary) hypertension: Secondary | ICD-10-CM | POA: Diagnosis not present

## 2023-05-03 DIAGNOSIS — M6281 Muscle weakness (generalized): Secondary | ICD-10-CM | POA: Diagnosis not present

## 2023-05-04 DIAGNOSIS — N179 Acute kidney failure, unspecified: Secondary | ICD-10-CM | POA: Diagnosis not present

## 2023-05-04 DIAGNOSIS — N139 Obstructive and reflux uropathy, unspecified: Secondary | ICD-10-CM | POA: Diagnosis not present

## 2023-05-04 DIAGNOSIS — I10 Essential (primary) hypertension: Secondary | ICD-10-CM | POA: Diagnosis not present

## 2023-05-04 DIAGNOSIS — D649 Anemia, unspecified: Secondary | ICD-10-CM | POA: Diagnosis not present

## 2023-05-04 DIAGNOSIS — M6281 Muscle weakness (generalized): Secondary | ICD-10-CM | POA: Diagnosis not present

## 2023-05-04 DIAGNOSIS — R2689 Other abnormalities of gait and mobility: Secondary | ICD-10-CM | POA: Diagnosis not present

## 2023-05-04 LAB — CULTURE, BLOOD (ROUTINE X 2)
Culture: NO GROWTH
Culture: NO GROWTH
Special Requests: ADEQUATE
Special Requests: ADEQUATE

## 2023-05-05 DIAGNOSIS — R2689 Other abnormalities of gait and mobility: Secondary | ICD-10-CM | POA: Diagnosis not present

## 2023-05-05 DIAGNOSIS — I499 Cardiac arrhythmia, unspecified: Secondary | ICD-10-CM | POA: Diagnosis not present

## 2023-05-06 DIAGNOSIS — N1330 Unspecified hydronephrosis: Secondary | ICD-10-CM | POA: Diagnosis not present

## 2023-05-06 DIAGNOSIS — R2689 Other abnormalities of gait and mobility: Secondary | ICD-10-CM | POA: Diagnosis not present

## 2023-05-07 DIAGNOSIS — N261 Atrophy of kidney (terminal): Secondary | ICD-10-CM | POA: Diagnosis not present

## 2023-05-07 DIAGNOSIS — R310 Gross hematuria: Secondary | ICD-10-CM | POA: Diagnosis not present

## 2023-05-07 DIAGNOSIS — R2689 Other abnormalities of gait and mobility: Secondary | ICD-10-CM | POA: Diagnosis not present

## 2023-05-07 DIAGNOSIS — N1339 Other hydronephrosis: Secondary | ICD-10-CM | POA: Diagnosis not present

## 2023-05-07 DIAGNOSIS — Z960 Presence of urogenital implants: Secondary | ICD-10-CM | POA: Diagnosis not present

## 2023-05-08 DIAGNOSIS — N261 Atrophy of kidney (terminal): Secondary | ICD-10-CM | POA: Diagnosis not present

## 2023-05-08 DIAGNOSIS — Z960 Presence of urogenital implants: Secondary | ICD-10-CM | POA: Diagnosis not present

## 2023-05-08 DIAGNOSIS — R310 Gross hematuria: Secondary | ICD-10-CM | POA: Diagnosis not present

## 2023-05-08 DIAGNOSIS — N1339 Other hydronephrosis: Secondary | ICD-10-CM | POA: Diagnosis not present

## 2023-05-08 DIAGNOSIS — R2689 Other abnormalities of gait and mobility: Secondary | ICD-10-CM | POA: Diagnosis not present

## 2023-05-08 DIAGNOSIS — N1330 Unspecified hydronephrosis: Secondary | ICD-10-CM | POA: Diagnosis not present

## 2023-05-09 DIAGNOSIS — R410 Disorientation, unspecified: Secondary | ICD-10-CM | POA: Diagnosis not present

## 2023-05-09 DIAGNOSIS — R2689 Other abnormalities of gait and mobility: Secondary | ICD-10-CM | POA: Diagnosis not present

## 2023-05-10 DIAGNOSIS — R2689 Other abnormalities of gait and mobility: Secondary | ICD-10-CM | POA: Diagnosis not present

## 2023-05-11 DIAGNOSIS — R2689 Other abnormalities of gait and mobility: Secondary | ICD-10-CM | POA: Diagnosis not present

## 2023-05-12 DIAGNOSIS — R2689 Other abnormalities of gait and mobility: Secondary | ICD-10-CM | POA: Diagnosis not present

## 2023-05-13 DIAGNOSIS — R2689 Other abnormalities of gait and mobility: Secondary | ICD-10-CM | POA: Diagnosis not present

## 2023-05-14 DIAGNOSIS — R2689 Other abnormalities of gait and mobility: Secondary | ICD-10-CM | POA: Diagnosis not present

## 2023-05-15 DIAGNOSIS — R2689 Other abnormalities of gait and mobility: Secondary | ICD-10-CM | POA: Diagnosis not present

## 2023-05-16 DIAGNOSIS — R2689 Other abnormalities of gait and mobility: Secondary | ICD-10-CM | POA: Diagnosis not present

## 2023-05-17 DIAGNOSIS — R2689 Other abnormalities of gait and mobility: Secondary | ICD-10-CM | POA: Diagnosis not present

## 2023-05-18 DIAGNOSIS — R2689 Other abnormalities of gait and mobility: Secondary | ICD-10-CM | POA: Diagnosis not present

## 2023-05-24 DIAGNOSIS — Z9049 Acquired absence of other specified parts of digestive tract: Secondary | ICD-10-CM | POA: Diagnosis not present

## 2023-05-24 DIAGNOSIS — M6281 Muscle weakness (generalized): Secondary | ICD-10-CM | POA: Diagnosis not present

## 2023-05-24 DIAGNOSIS — G3184 Mild cognitive impairment, so stated: Secondary | ICD-10-CM | POA: Diagnosis not present

## 2023-05-24 DIAGNOSIS — M51369 Other intervertebral disc degeneration, lumbar region without mention of lumbar back pain or lower extremity pain: Secondary | ICD-10-CM | POA: Diagnosis not present

## 2023-05-24 DIAGNOSIS — N138 Other obstructive and reflux uropathy: Secondary | ICD-10-CM | POA: Diagnosis not present

## 2023-05-24 DIAGNOSIS — Z8249 Family history of ischemic heart disease and other diseases of the circulatory system: Secondary | ICD-10-CM | POA: Diagnosis not present

## 2023-05-24 DIAGNOSIS — F17210 Nicotine dependence, cigarettes, uncomplicated: Secondary | ICD-10-CM | POA: Diagnosis not present

## 2023-05-24 DIAGNOSIS — Z743 Need for continuous supervision: Secondary | ICD-10-CM | POA: Diagnosis not present

## 2023-05-24 DIAGNOSIS — R7401 Elevation of levels of liver transaminase levels: Secondary | ICD-10-CM | POA: Diagnosis not present

## 2023-05-24 DIAGNOSIS — M1612 Unilateral primary osteoarthritis, left hip: Secondary | ICD-10-CM | POA: Diagnosis not present

## 2023-05-24 DIAGNOSIS — N390 Urinary tract infection, site not specified: Secondary | ICD-10-CM | POA: Diagnosis not present

## 2023-05-24 DIAGNOSIS — E039 Hypothyroidism, unspecified: Secondary | ICD-10-CM | POA: Diagnosis not present

## 2023-05-24 DIAGNOSIS — N134 Hydroureter: Secondary | ICD-10-CM | POA: Diagnosis not present

## 2023-05-24 DIAGNOSIS — M549 Dorsalgia, unspecified: Secondary | ICD-10-CM | POA: Diagnosis not present

## 2023-05-24 DIAGNOSIS — D649 Anemia, unspecified: Secondary | ICD-10-CM | POA: Diagnosis not present

## 2023-05-24 DIAGNOSIS — Z1152 Encounter for screening for COVID-19: Secondary | ICD-10-CM | POA: Diagnosis not present

## 2023-05-24 DIAGNOSIS — R110 Nausea: Secondary | ICD-10-CM | POA: Diagnosis not present

## 2023-05-24 DIAGNOSIS — E43 Unspecified severe protein-calorie malnutrition: Secondary | ICD-10-CM | POA: Diagnosis not present

## 2023-05-24 DIAGNOSIS — N1330 Unspecified hydronephrosis: Secondary | ICD-10-CM | POA: Diagnosis not present

## 2023-05-24 DIAGNOSIS — Z452 Encounter for adjustment and management of vascular access device: Secondary | ICD-10-CM | POA: Diagnosis not present

## 2023-05-24 DIAGNOSIS — E876 Hypokalemia: Secondary | ICD-10-CM | POA: Diagnosis not present

## 2023-05-24 DIAGNOSIS — G4489 Other headache syndrome: Secondary | ICD-10-CM | POA: Diagnosis not present

## 2023-05-24 DIAGNOSIS — G250 Essential tremor: Secondary | ICD-10-CM | POA: Diagnosis not present

## 2023-05-24 DIAGNOSIS — M76892 Other specified enthesopathies of left lower limb, excluding foot: Secondary | ICD-10-CM | POA: Diagnosis not present

## 2023-05-24 DIAGNOSIS — R739 Hyperglycemia, unspecified: Secondary | ICD-10-CM | POA: Diagnosis not present

## 2023-05-24 DIAGNOSIS — E8721 Acute metabolic acidosis: Secondary | ICD-10-CM | POA: Diagnosis not present

## 2023-05-24 DIAGNOSIS — Z833 Family history of diabetes mellitus: Secondary | ICD-10-CM | POA: Diagnosis not present

## 2023-05-24 DIAGNOSIS — Z936 Other artificial openings of urinary tract status: Secondary | ICD-10-CM | POA: Diagnosis not present

## 2023-05-24 DIAGNOSIS — R52 Pain, unspecified: Secondary | ICD-10-CM | POA: Diagnosis not present

## 2023-05-24 DIAGNOSIS — E569 Vitamin deficiency, unspecified: Secondary | ICD-10-CM | POA: Diagnosis not present

## 2023-05-24 DIAGNOSIS — Z789 Other specified health status: Secondary | ICD-10-CM | POA: Diagnosis not present

## 2023-05-24 DIAGNOSIS — N1831 Chronic kidney disease, stage 3a: Secondary | ICD-10-CM | POA: Diagnosis not present

## 2023-05-24 DIAGNOSIS — Z751 Person awaiting admission to adequate facility elsewhere: Secondary | ICD-10-CM | POA: Diagnosis not present

## 2023-05-24 DIAGNOSIS — R7989 Other specified abnormal findings of blood chemistry: Secondary | ICD-10-CM | POA: Diagnosis not present

## 2023-05-24 DIAGNOSIS — R000 Tachycardia, unspecified: Secondary | ICD-10-CM | POA: Diagnosis not present

## 2023-05-24 DIAGNOSIS — F32A Depression, unspecified: Secondary | ICD-10-CM | POA: Diagnosis present

## 2023-05-24 DIAGNOSIS — Z23 Encounter for immunization: Secondary | ICD-10-CM | POA: Diagnosis not present

## 2023-05-24 DIAGNOSIS — N179 Acute kidney failure, unspecified: Secondary | ICD-10-CM | POA: Diagnosis not present

## 2023-05-24 DIAGNOSIS — Z932 Ileostomy status: Secondary | ICD-10-CM | POA: Diagnosis not present

## 2023-05-24 DIAGNOSIS — Z885 Allergy status to narcotic agent status: Secondary | ICD-10-CM | POA: Diagnosis not present

## 2023-05-24 DIAGNOSIS — R638 Other symptoms and signs concerning food and fluid intake: Secondary | ICD-10-CM | POA: Diagnosis not present

## 2023-05-24 DIAGNOSIS — E878 Other disorders of electrolyte and fluid balance, not elsewhere classified: Secondary | ICD-10-CM | POA: Diagnosis not present

## 2023-05-24 DIAGNOSIS — Z978 Presence of other specified devices: Secondary | ICD-10-CM | POA: Diagnosis not present

## 2023-05-24 DIAGNOSIS — R531 Weakness: Secondary | ICD-10-CM | POA: Diagnosis not present

## 2023-05-24 DIAGNOSIS — E872 Acidosis, unspecified: Secondary | ICD-10-CM | POA: Diagnosis not present

## 2023-05-24 DIAGNOSIS — Z8541 Personal history of malignant neoplasm of cervix uteri: Secondary | ICD-10-CM | POA: Diagnosis not present

## 2023-05-24 DIAGNOSIS — Z87440 Personal history of urinary (tract) infections: Secondary | ICD-10-CM | POA: Diagnosis not present

## 2023-05-24 DIAGNOSIS — R4781 Slurred speech: Secondary | ICD-10-CM | POA: Diagnosis not present

## 2023-05-24 DIAGNOSIS — Z79890 Hormone replacement therapy (postmenopausal): Secondary | ICD-10-CM | POA: Diagnosis not present

## 2023-05-24 DIAGNOSIS — E871 Hypo-osmolality and hyponatremia: Secondary | ICD-10-CM | POA: Diagnosis not present

## 2023-05-24 DIAGNOSIS — N1832 Chronic kidney disease, stage 3b: Secondary | ICD-10-CM | POA: Diagnosis not present

## 2023-05-24 DIAGNOSIS — I700 Atherosclerosis of aorta: Secondary | ICD-10-CM | POA: Diagnosis not present

## 2023-05-24 DIAGNOSIS — R1114 Bilious vomiting: Secondary | ICD-10-CM | POA: Diagnosis not present

## 2023-05-24 DIAGNOSIS — E860 Dehydration: Secondary | ICD-10-CM | POA: Diagnosis not present

## 2023-05-24 DIAGNOSIS — R519 Headache, unspecified: Secondary | ICD-10-CM | POA: Diagnosis not present

## 2023-05-24 DIAGNOSIS — E038 Other specified hypothyroidism: Secondary | ICD-10-CM | POA: Diagnosis not present

## 2023-05-24 DIAGNOSIS — N1339 Other hydronephrosis: Secondary | ICD-10-CM | POA: Diagnosis not present

## 2023-05-24 DIAGNOSIS — F419 Anxiety disorder, unspecified: Secondary | ICD-10-CM | POA: Diagnosis present

## 2023-05-24 DIAGNOSIS — E782 Mixed hyperlipidemia: Secondary | ICD-10-CM | POA: Diagnosis not present

## 2023-05-24 DIAGNOSIS — J302 Other seasonal allergic rhinitis: Secondary | ICD-10-CM | POA: Diagnosis not present

## 2023-05-24 DIAGNOSIS — N139 Obstructive and reflux uropathy, unspecified: Secondary | ICD-10-CM | POA: Diagnosis not present

## 2023-05-24 DIAGNOSIS — R112 Nausea with vomiting, unspecified: Secondary | ICD-10-CM | POA: Diagnosis not present

## 2023-05-24 DIAGNOSIS — R451 Restlessness and agitation: Secondary | ICD-10-CM | POA: Diagnosis not present

## 2023-05-24 DIAGNOSIS — E7800 Pure hypercholesterolemia, unspecified: Secondary | ICD-10-CM | POA: Diagnosis not present

## 2023-05-24 DIAGNOSIS — N261 Atrophy of kidney (terminal): Secondary | ICD-10-CM | POA: Diagnosis not present

## 2023-05-24 DIAGNOSIS — R251 Tremor, unspecified: Secondary | ICD-10-CM | POA: Diagnosis not present

## 2023-05-24 DIAGNOSIS — M545 Low back pain, unspecified: Secondary | ICD-10-CM | POA: Diagnosis not present

## 2023-05-24 DIAGNOSIS — M25552 Pain in left hip: Secondary | ICD-10-CM | POA: Diagnosis not present

## 2023-05-24 DIAGNOSIS — K56609 Unspecified intestinal obstruction, unspecified as to partial versus complete obstruction: Secondary | ICD-10-CM | POA: Diagnosis not present

## 2023-05-24 DIAGNOSIS — R627 Adult failure to thrive: Secondary | ICD-10-CM | POA: Diagnosis not present

## 2023-05-25 DIAGNOSIS — Z789 Other specified health status: Secondary | ICD-10-CM | POA: Diagnosis not present

## 2023-05-25 DIAGNOSIS — R627 Adult failure to thrive: Secondary | ICD-10-CM | POA: Diagnosis not present

## 2023-05-25 DIAGNOSIS — R638 Other symptoms and signs concerning food and fluid intake: Secondary | ICD-10-CM | POA: Diagnosis not present

## 2023-05-25 DIAGNOSIS — R531 Weakness: Secondary | ICD-10-CM | POA: Diagnosis not present

## 2023-05-25 DIAGNOSIS — N138 Other obstructive and reflux uropathy: Secondary | ICD-10-CM | POA: Diagnosis not present

## 2023-05-26 DIAGNOSIS — R110 Nausea: Secondary | ICD-10-CM | POA: Diagnosis not present

## 2023-05-26 DIAGNOSIS — R519 Headache, unspecified: Secondary | ICD-10-CM | POA: Diagnosis not present

## 2023-05-26 DIAGNOSIS — E569 Vitamin deficiency, unspecified: Secondary | ICD-10-CM | POA: Diagnosis not present

## 2023-05-26 DIAGNOSIS — E7800 Pure hypercholesterolemia, unspecified: Secondary | ICD-10-CM | POA: Diagnosis not present

## 2023-05-26 DIAGNOSIS — Z789 Other specified health status: Secondary | ICD-10-CM | POA: Diagnosis not present

## 2023-05-26 DIAGNOSIS — E038 Other specified hypothyroidism: Secondary | ICD-10-CM | POA: Diagnosis not present

## 2023-05-27 DIAGNOSIS — D649 Anemia, unspecified: Secondary | ICD-10-CM | POA: Diagnosis not present

## 2023-05-27 DIAGNOSIS — N1832 Chronic kidney disease, stage 3b: Secondary | ICD-10-CM | POA: Diagnosis not present

## 2023-05-27 DIAGNOSIS — R531 Weakness: Secondary | ICD-10-CM | POA: Diagnosis not present

## 2023-05-27 DIAGNOSIS — R638 Other symptoms and signs concerning food and fluid intake: Secondary | ICD-10-CM | POA: Diagnosis not present

## 2023-05-27 DIAGNOSIS — E871 Hypo-osmolality and hyponatremia: Secondary | ICD-10-CM | POA: Diagnosis not present

## 2023-05-27 DIAGNOSIS — R739 Hyperglycemia, unspecified: Secondary | ICD-10-CM | POA: Diagnosis not present

## 2023-05-27 DIAGNOSIS — N390 Urinary tract infection, site not specified: Secondary | ICD-10-CM | POA: Diagnosis not present

## 2023-05-27 DIAGNOSIS — J302 Other seasonal allergic rhinitis: Secondary | ICD-10-CM | POA: Diagnosis not present

## 2023-05-27 DIAGNOSIS — N139 Obstructive and reflux uropathy, unspecified: Secondary | ICD-10-CM | POA: Diagnosis not present

## 2023-05-27 DIAGNOSIS — Z789 Other specified health status: Secondary | ICD-10-CM | POA: Diagnosis not present

## 2023-05-28 DIAGNOSIS — N390 Urinary tract infection, site not specified: Secondary | ICD-10-CM | POA: Diagnosis not present

## 2023-05-28 DIAGNOSIS — N139 Obstructive and reflux uropathy, unspecified: Secondary | ICD-10-CM | POA: Diagnosis not present

## 2023-05-28 DIAGNOSIS — K56609 Unspecified intestinal obstruction, unspecified as to partial versus complete obstruction: Secondary | ICD-10-CM | POA: Diagnosis not present

## 2023-05-28 DIAGNOSIS — Z789 Other specified health status: Secondary | ICD-10-CM | POA: Diagnosis not present

## 2023-05-28 DIAGNOSIS — R531 Weakness: Secondary | ICD-10-CM | POA: Diagnosis not present

## 2023-05-29 ENCOUNTER — Other Ambulatory Visit: Payer: Self-pay | Admitting: *Deleted

## 2023-05-29 DIAGNOSIS — N390 Urinary tract infection, site not specified: Secondary | ICD-10-CM | POA: Diagnosis not present

## 2023-05-29 DIAGNOSIS — M549 Dorsalgia, unspecified: Secondary | ICD-10-CM | POA: Diagnosis not present

## 2023-05-29 DIAGNOSIS — M25552 Pain in left hip: Secondary | ICD-10-CM | POA: Diagnosis not present

## 2023-05-29 DIAGNOSIS — Z978 Presence of other specified devices: Secondary | ICD-10-CM | POA: Diagnosis not present

## 2023-05-29 DIAGNOSIS — R531 Weakness: Secondary | ICD-10-CM | POA: Diagnosis not present

## 2023-05-29 NOTE — Patient Outreach (Signed)
Late entry for 05/28/23  Telephonic meeting with [facility] discharge planning team and therapy manager. Mrs. Eto recently admitted to [facility] SN[facility]. Currently has complex medical care.   Discussed Clinical research associate will follow for potential care management services as benefit of health plan and PCP. Appears new PCP appointment scheduled with [name], NP with [facility] in January 2025.   Will continue to follow.  Raiford Noble, MSN, RN, BSN [place]  [facility], [facility] RN Post- Acute Care Manager Direct Dial: [phone]

## 2023-05-31 DIAGNOSIS — M51369 Other intervertebral disc degeneration, lumbar region without mention of lumbar back pain or lower extremity pain: Secondary | ICD-10-CM | POA: Diagnosis not present

## 2023-05-31 DIAGNOSIS — M76892 Other specified enthesopathies of left lower limb, excluding foot: Secondary | ICD-10-CM | POA: Diagnosis not present

## 2023-05-31 DIAGNOSIS — Z978 Presence of other specified devices: Secondary | ICD-10-CM | POA: Diagnosis not present

## 2023-05-31 DIAGNOSIS — M549 Dorsalgia, unspecified: Secondary | ICD-10-CM | POA: Diagnosis not present

## 2023-05-31 DIAGNOSIS — M25552 Pain in left hip: Secondary | ICD-10-CM | POA: Diagnosis not present

## 2023-05-31 DIAGNOSIS — M1612 Unilateral primary osteoarthritis, left hip: Secondary | ICD-10-CM | POA: Diagnosis not present

## 2023-05-31 DIAGNOSIS — N390 Urinary tract infection, site not specified: Secondary | ICD-10-CM | POA: Diagnosis not present

## 2023-05-31 DIAGNOSIS — R531 Weakness: Secondary | ICD-10-CM | POA: Diagnosis not present

## 2023-06-01 DIAGNOSIS — Z936 Other artificial openings of urinary tract status: Secondary | ICD-10-CM | POA: Diagnosis not present

## 2023-06-01 DIAGNOSIS — Z978 Presence of other specified devices: Secondary | ICD-10-CM | POA: Diagnosis not present

## 2023-06-01 DIAGNOSIS — N390 Urinary tract infection, site not specified: Secondary | ICD-10-CM | POA: Diagnosis not present

## 2023-06-01 DIAGNOSIS — N139 Obstructive and reflux uropathy, unspecified: Secondary | ICD-10-CM | POA: Diagnosis not present

## 2023-06-02 DIAGNOSIS — Z936 Other artificial openings of urinary tract status: Secondary | ICD-10-CM | POA: Diagnosis not present

## 2023-06-02 DIAGNOSIS — Z932 Ileostomy status: Secondary | ICD-10-CM | POA: Diagnosis not present

## 2023-06-02 DIAGNOSIS — R531 Weakness: Secondary | ICD-10-CM | POA: Diagnosis not present

## 2023-06-02 DIAGNOSIS — N390 Urinary tract infection, site not specified: Secondary | ICD-10-CM | POA: Diagnosis not present

## 2023-06-02 DIAGNOSIS — R4781 Slurred speech: Secondary | ICD-10-CM | POA: Diagnosis not present

## 2023-06-03 DIAGNOSIS — R7989 Other specified abnormal findings of blood chemistry: Secondary | ICD-10-CM | POA: Diagnosis not present

## 2023-06-03 DIAGNOSIS — E860 Dehydration: Secondary | ICD-10-CM | POA: Diagnosis not present

## 2023-06-03 DIAGNOSIS — E871 Hypo-osmolality and hyponatremia: Secondary | ICD-10-CM | POA: Diagnosis not present

## 2023-06-03 DIAGNOSIS — R638 Other symptoms and signs concerning food and fluid intake: Secondary | ICD-10-CM | POA: Diagnosis not present

## 2023-06-04 DIAGNOSIS — R451 Restlessness and agitation: Secondary | ICD-10-CM | POA: Diagnosis not present

## 2023-06-05 DIAGNOSIS — R451 Restlessness and agitation: Secondary | ICD-10-CM | POA: Diagnosis not present

## 2023-06-06 DIAGNOSIS — R52 Pain, unspecified: Secondary | ICD-10-CM | POA: Diagnosis not present

## 2023-06-06 DIAGNOSIS — Z936 Other artificial openings of urinary tract status: Secondary | ICD-10-CM | POA: Diagnosis not present

## 2023-06-06 DIAGNOSIS — R451 Restlessness and agitation: Secondary | ICD-10-CM | POA: Diagnosis not present

## 2023-06-07 DIAGNOSIS — R451 Restlessness and agitation: Secondary | ICD-10-CM | POA: Diagnosis not present

## 2023-06-07 DIAGNOSIS — Z789 Other specified health status: Secondary | ICD-10-CM | POA: Diagnosis not present

## 2023-06-07 DIAGNOSIS — R638 Other symptoms and signs concerning food and fluid intake: Secondary | ICD-10-CM | POA: Diagnosis not present

## 2023-06-09 ENCOUNTER — Encounter (HOSPITAL_COMMUNITY): Payer: Self-pay | Admitting: Emergency Medicine

## 2023-06-09 ENCOUNTER — Other Ambulatory Visit: Payer: Self-pay

## 2023-06-09 ENCOUNTER — Emergency Department (HOSPITAL_COMMUNITY): Payer: Medicare Other

## 2023-06-09 ENCOUNTER — Inpatient Hospital Stay (HOSPITAL_COMMUNITY)
Admission: EM | Admit: 2023-06-09 | Discharge: 2023-06-24 | DRG: 641 | Disposition: A | Discharge status: Home Health | Payer: Medicare Other | Attending: Family Medicine | Admitting: Family Medicine

## 2023-06-09 DIAGNOSIS — R627 Adult failure to thrive: Secondary | ICD-10-CM

## 2023-06-09 DIAGNOSIS — N134 Hydroureter: Secondary | ICD-10-CM | POA: Diagnosis not present

## 2023-06-09 DIAGNOSIS — F17210 Nicotine dependence, cigarettes, uncomplicated: Secondary | ICD-10-CM | POA: Diagnosis present

## 2023-06-09 DIAGNOSIS — R1114 Bilious vomiting: Secondary | ICD-10-CM | POA: Diagnosis not present

## 2023-06-09 DIAGNOSIS — R739 Hyperglycemia, unspecified: Secondary | ICD-10-CM | POA: Diagnosis not present

## 2023-06-09 DIAGNOSIS — Z936 Other artificial openings of urinary tract status: Secondary | ICD-10-CM

## 2023-06-09 DIAGNOSIS — G4489 Other headache syndrome: Secondary | ICD-10-CM | POA: Diagnosis not present

## 2023-06-09 DIAGNOSIS — Z751 Person awaiting admission to adequate facility elsewhere: Secondary | ICD-10-CM

## 2023-06-09 DIAGNOSIS — R112 Nausea with vomiting, unspecified: Principal | ICD-10-CM

## 2023-06-09 DIAGNOSIS — Z789 Other specified health status: Secondary | ICD-10-CM | POA: Diagnosis not present

## 2023-06-09 DIAGNOSIS — Z9049 Acquired absence of other specified parts of digestive tract: Secondary | ICD-10-CM

## 2023-06-09 DIAGNOSIS — N1832 Chronic kidney disease, stage 3b: Secondary | ICD-10-CM

## 2023-06-09 DIAGNOSIS — E871 Hypo-osmolality and hyponatremia: Principal | ICD-10-CM

## 2023-06-09 DIAGNOSIS — Z87440 Personal history of urinary (tract) infections: Secondary | ICD-10-CM | POA: Diagnosis not present

## 2023-06-09 DIAGNOSIS — Z90710 Acquired absence of both cervix and uterus: Secondary | ICD-10-CM

## 2023-06-09 DIAGNOSIS — Z888 Allergy status to other drugs, medicaments and biological substances status: Secondary | ICD-10-CM

## 2023-06-09 DIAGNOSIS — E782 Mixed hyperlipidemia: Secondary | ICD-10-CM | POA: Diagnosis not present

## 2023-06-09 DIAGNOSIS — Z833 Family history of diabetes mellitus: Secondary | ICD-10-CM

## 2023-06-09 DIAGNOSIS — G250 Essential tremor: Secondary | ICD-10-CM | POA: Diagnosis present

## 2023-06-09 DIAGNOSIS — R251 Tremor, unspecified: Secondary | ICD-10-CM

## 2023-06-09 DIAGNOSIS — Z885 Allergy status to narcotic agent status: Secondary | ICD-10-CM

## 2023-06-09 DIAGNOSIS — R000 Tachycardia, unspecified: Secondary | ICD-10-CM | POA: Diagnosis not present

## 2023-06-09 DIAGNOSIS — E039 Hypothyroidism, unspecified: Secondary | ICD-10-CM | POA: Diagnosis not present

## 2023-06-09 DIAGNOSIS — E872 Acidosis, unspecified: Secondary | ICD-10-CM | POA: Diagnosis not present

## 2023-06-09 DIAGNOSIS — F419 Anxiety disorder, unspecified: Secondary | ICD-10-CM | POA: Diagnosis present

## 2023-06-09 DIAGNOSIS — Z743 Need for continuous supervision: Secondary | ICD-10-CM | POA: Diagnosis not present

## 2023-06-09 DIAGNOSIS — Z79890 Hormone replacement therapy (postmenopausal): Secondary | ICD-10-CM

## 2023-06-09 DIAGNOSIS — Z8249 Family history of ischemic heart disease and other diseases of the circulatory system: Secondary | ICD-10-CM | POA: Diagnosis not present

## 2023-06-09 DIAGNOSIS — R54 Age-related physical debility: Secondary | ICD-10-CM | POA: Diagnosis present

## 2023-06-09 DIAGNOSIS — E8721 Acute metabolic acidosis: Secondary | ICD-10-CM | POA: Diagnosis not present

## 2023-06-09 DIAGNOSIS — R7401 Elevation of levels of liver transaminase levels: Secondary | ICD-10-CM | POA: Diagnosis not present

## 2023-06-09 DIAGNOSIS — E878 Other disorders of electrolyte and fluid balance, not elsewhere classified: Secondary | ICD-10-CM | POA: Diagnosis not present

## 2023-06-09 DIAGNOSIS — Z79899 Other long term (current) drug therapy: Secondary | ICD-10-CM

## 2023-06-09 DIAGNOSIS — N1330 Unspecified hydronephrosis: Secondary | ICD-10-CM | POA: Diagnosis not present

## 2023-06-09 DIAGNOSIS — Z8541 Personal history of malignant neoplasm of cervix uteri: Secondary | ICD-10-CM

## 2023-06-09 DIAGNOSIS — N189 Chronic kidney disease, unspecified: Secondary | ICD-10-CM | POA: Insufficient documentation

## 2023-06-09 DIAGNOSIS — R638 Other symptoms and signs concerning food and fluid intake: Secondary | ICD-10-CM | POA: Diagnosis not present

## 2023-06-09 DIAGNOSIS — Z923 Personal history of irradiation: Secondary | ICD-10-CM

## 2023-06-09 DIAGNOSIS — Z1152 Encounter for screening for COVID-19: Secondary | ICD-10-CM | POA: Diagnosis not present

## 2023-06-09 DIAGNOSIS — R451 Restlessness and agitation: Secondary | ICD-10-CM | POA: Diagnosis not present

## 2023-06-09 DIAGNOSIS — Z932 Ileostomy status: Secondary | ICD-10-CM

## 2023-06-09 DIAGNOSIS — E876 Hypokalemia: Secondary | ICD-10-CM | POA: Diagnosis not present

## 2023-06-09 DIAGNOSIS — N179 Acute kidney failure, unspecified: Secondary | ICD-10-CM | POA: Diagnosis not present

## 2023-06-09 DIAGNOSIS — Z452 Encounter for adjustment and management of vascular access device: Secondary | ICD-10-CM | POA: Diagnosis not present

## 2023-06-09 DIAGNOSIS — F32A Depression, unspecified: Secondary | ICD-10-CM | POA: Diagnosis present

## 2023-06-09 DIAGNOSIS — N261 Atrophy of kidney (terminal): Secondary | ICD-10-CM | POA: Diagnosis not present

## 2023-06-09 DIAGNOSIS — N1831 Chronic kidney disease, stage 3a: Secondary | ICD-10-CM | POA: Diagnosis present

## 2023-06-09 DIAGNOSIS — R531 Weakness: Secondary | ICD-10-CM | POA: Diagnosis not present

## 2023-06-09 LAB — COMPREHENSIVE METABOLIC PANEL
ALT: 125 U/L — ABNORMAL HIGH (ref 0–44)
AST: 49 U/L — ABNORMAL HIGH (ref 15–41)
Albumin: 3.6 g/dL (ref 3.5–5.0)
Alkaline Phosphatase: 308 U/L — ABNORMAL HIGH (ref 38–126)
Anion gap: 15 (ref 5–15)
BUN: 128 mg/dL — ABNORMAL HIGH (ref 8–23)
CO2: 18 mmol/L — ABNORMAL LOW (ref 22–32)
Calcium: 10.1 mg/dL (ref 8.9–10.3)
Chloride: 88 mmol/L — ABNORMAL LOW (ref 98–111)
Creatinine, Ser: 2.07 mg/dL — ABNORMAL HIGH (ref 0.44–1.00)
GFR, Estimated: 26 mL/min — ABNORMAL LOW (ref >60)
Glucose, Bld: 270 mg/dL — ABNORMAL HIGH (ref 70–99)
Potassium: 4.8 mmol/L (ref 3.5–5.1)
Sodium: 121 mmol/L — ABNORMAL LOW (ref 135–145)
Total Bilirubin: 0.5 mg/dL (ref <1.2)
Total Protein: 8.4 g/dL — ABNORMAL HIGH (ref 6.5–8.1)

## 2023-06-09 LAB — CBC WITH DIFFERENTIAL/PLATELET
Abs Immature Granulocytes: 0.03 10*3/uL (ref 0.00–0.07)
Basophils Absolute: 0 10*3/uL (ref 0.0–0.1)
Basophils Relative: 0 %
Eosinophils Absolute: 0 10*3/uL (ref 0.0–0.5)
Eosinophils Relative: 0 %
HCT: 36 % (ref 36.0–46.0)
Hemoglobin: 12.9 g/dL (ref 12.0–15.0)
Immature Granulocytes: 0 %
Lymphocytes Relative: 5 %
Lymphs Abs: 0.5 10*3/uL — ABNORMAL LOW (ref 0.7–4.0)
MCH: 29.7 pg (ref 26.0–34.0)
MCHC: 35.8 g/dL (ref 30.0–36.0)
MCV: 82.9 fL (ref 80.0–100.0)
Monocytes Absolute: 0.3 10*3/uL (ref 0.1–1.0)
Monocytes Relative: 3 %
Neutro Abs: 8.3 10*3/uL — ABNORMAL HIGH (ref 1.7–7.7)
Neutrophils Relative %: 92 %
Platelets: 284 10*3/uL (ref 150–400)
RBC: 4.34 MIL/uL (ref 3.87–5.11)
RDW: 14.6 % (ref 11.5–15.5)
WBC: 9.1 10*3/uL (ref 4.0–10.5)
nRBC: 0 % (ref 0.0–0.2)

## 2023-06-09 LAB — RESP PANEL BY RT-PCR (RSV, FLU A&B, COVID)  RVPGX2
Influenza A by PCR: NEGATIVE
Influenza B by PCR: NEGATIVE
Resp Syncytial Virus by PCR: NEGATIVE
SARS Coronavirus 2 by RT PCR: NEGATIVE

## 2023-06-09 LAB — SODIUM: Sodium: 123 mmol/L — ABNORMAL LOW (ref 135–145)

## 2023-06-09 LAB — LIPASE, BLOOD: Lipase: 48 U/L (ref 11–51)

## 2023-06-09 MED ORDER — ACETAMINOPHEN 500 MG PO TABS
1000.0000 mg | ORAL_TABLET | Freq: Once | ORAL | Status: DC
  Filled 2023-06-09 (×3): qty 2

## 2023-06-09 MED ORDER — ONDANSETRON HCL 4 MG/2ML IJ SOLN
4.0000 mg | Freq: Once | INTRAMUSCULAR | Status: AC
  Administered 2023-06-09: 4 mg via INTRAVENOUS
  Filled 2023-06-09: qty 2

## 2023-06-09 MED ORDER — MORPHINE SULFATE (PF) 4 MG/ML IV SOLN
4.0000 mg | Freq: Once | INTRAVENOUS | Status: AC
  Administered 2023-06-09: 2 mg via INTRAVENOUS
  Filled 2023-06-09: qty 1

## 2023-06-09 MED ORDER — SODIUM CHLORIDE 0.9 % IV BOLUS
1000.0000 mL | Freq: Once | INTRAVENOUS | Status: AC
  Administered 2023-06-09: 1000 mL via INTRAVENOUS

## 2023-06-09 NOTE — ED Provider Notes (Signed)
[place] EMERGENCY DEPARTMENT AT [facility] Provider Note   CSN: [id number] Arrival date & time: 06/09/23  1958     History  No chief complaint on file.   [name] is a 67 y.o. female.  The history is provided by the patient, the EMS personnel and medical records. No language interpreter was used.     67 year old female significant history of cervical cancer, with colovaginal fistula, CKD, anxiety, chronic headache, brought here via EMS with concerns of abnormal labs.  Patient lives at a nursing facility.  She has had some nausea and vomiting and per EMS, she had a blood work done today and was noted to have a low sodium of 119 and a BUN of 124.  The facility gave her Zofran however it has not helped.  Patient also is on continuous TPN-lipid.  She also had colonoscopy and nephrostomy.  Per patient, she is feeling very nauseous and had been vomiting.  She felt like it to started today.  States she did not feel well yesterday.  She denies any active pain however she is a poor historian.  She does not complain of any headache chest pain trouble breathing abdominal pain or urinary symptoms.  Home Medications Prior to Admission medications   Medication Sig Start Date End Date Taking? Authorizing Provider  acetaminophen (TYLENOL) 500 MG tablet Take 1,000 mg by mouth every 6 (six) hours as needed.    [provider]  levothyroxine (SYNTHROID) 25 MCG tablet Take 1 tablet (25 mcg total) by mouth daily. 07/17/22   [name], DO  metoCLOPramide (REGLAN) 5 MG tablet Take 5 mg by mouth 4 (four) times daily. For 14 days. Stop November 9 Patient not taking: Reported on 04/28/2023    [provider]  mirtazapine (REMERON) 7.5 MG tablet Take 7.5 mg by mouth at bedtime.    [provider]  promethazine (PHENERGAN) 12.5 MG tablet Take 12.5 mg by mouth every 6 (six) hours as needed for nausea or vomiting.    [provider]  simvastatin  (ZOCOR) 20 MG tablet Take 1 tablet (20 mg total) by mouth daily at 6 PM. 12/06/22   Delynn Flavin M, DO      Allergies    Barium-containing compounds and Percocet [oxycodone-acetaminophen]    Review of Systems   Review of Systems  All other systems reviewed and are negative.   Physical Exam Updated Vital Signs BP 124/83   Pulse 90   Temp 98.6 F (37 C) (Oral)   Resp 17   SpO2 100%  Physical Exam Vitals and nursing note reviewed.  Constitutional:      General: She is not in acute distress.    Appearance: She is well-developed.     Comments: Chronically ill-appearing female appears tremulous, actively dry heaving.  HENT:     Head: Atraumatic.     Mouth/Throat:     Mouth: Mucous membranes are dry.  Eyes:     Conjunctiva/sclera: Conjunctivae normal.  Cardiovascular:     Rate and Rhythm: Tachycardia present.     Pulses: Normal pulses.     Heart sounds: Normal heart sounds.  Pulmonary:     Effort: Pulmonary effort is normal.     Breath sounds: No wheezing, rhonchi or rales.  Abdominal:     Palpations: Abdomen is soft.     Tenderness: There is no abdominal tenderness.     Comments: Ostomy bag without significant output.  Musculoskeletal:     Cervical back:  Normal range of motion and neck supple.  Skin:    Coloration: Skin is pale.     Findings: No rash.  Neurological:     Mental Status: She is alert and oriented to person, place, and time.  Psychiatric:        Mood and Affect: Mood normal.     ED Results / Procedures / Treatments   Labs (all labs ordered are listed, but only abnormal results are displayed) Labs Reviewed  CBC WITH DIFFERENTIAL/PLATELET - Abnormal; Notable for the following components:      Result Value   Neutro Abs 8.3 (*)    Lymphs Abs 0.5 (*)    All other components within normal limits  COMPREHENSIVE METABOLIC PANEL - Abnormal; Notable for the following components:   Sodium 121 (*)    Chloride 88 (*)    CO2 18 (*)    Glucose, Bld 270  (*)    BUN 128 (*)    Creatinine, Ser 2.07 (*)    Total Protein 8.4 (*)    AST 49 (*)    ALT 125 (*)    Alkaline Phosphatase 308 (*)    GFR, Estimated 26 (*)    All other components within normal limits  RESP PANEL BY RT-PCR (RSV, FLU A&B, COVID)  RVPGX2  LIPASE, BLOOD  URINALYSIS, ROUTINE W REFLEX MICROSCOPIC    EKG None ED ECG REPORT   Date: 06/09/2023  Rate: 90  Rhythm: sinus tachycardia  QRS Axis: normal  Intervals: normal  ST/T Wave abnormalities: nonspecific ST changes  Conduction Disutrbances:nonspecific intraventricular conduction delay  Narrative Interpretation:   Old EKG Reviewed: unchanged  I have personally reviewed the EKG tracing and disagree with the computerized printout as noted.   Radiology No results found.  Procedures .Critical Care  Performed by: Fayrene Helper, PA-C Authorized by: Fayrene Helper, PA-C   Critical care provider statement:    Critical care time (minutes):  35   Critical care was time spent personally by me on the following activities:  Development of treatment plan with patient or surrogate, discussions with consultants, evaluation of patient's response to treatment, examination of patient, ordering and review of laboratory studies, ordering and review of radiographic studies, ordering and performing treatments and interventions, pulse oximetry, re-evaluation of patient's condition and review of old charts     Medications Ordered in ED Medications  acetaminophen (TYLENOL) tablet 1,000 mg (1,000 mg Oral Not Given 06/09/23 2118)  ondansetron (ZOFRAN) injection 4 mg (4 mg Intravenous Given 06/09/23 2034)  sodium chloride 0.9 % bolus 1,000 mL (1,000 mLs Intravenous New Bag/Given 06/09/23 2033)  morphine (PF) 4 MG/ML injection 4 mg (2 mg Intravenous Given 06/09/23 2121)    ED Course/ Medical Decision Making/ A&P                                 Medical Decision Making  BP 124/83   Pulse 90   Temp 98.6 F (37 C) (Oral)   Resp 17    SpO2 100%   94:1 PM 67 year old female significant history of cervical cancer, with colovaginal fistula, CKD, anxiety, chronic headache, brought here via EMS with concerns of abnormal labs.  Patient lives at a nursing facility.  She has had some nausea and vomiting and per EMS, she had a blood work done today and was noted to have a low sodium of 119 and a BUN of 124.  The facility gave her Zofran  however it has not helped.  Patient also is on continuous TPN-lipid.  She also had colonoscopy and nephrostomy.  Per patient, she is feeling very nauseous and had been vomiting.  She felt like it to started today.  States she did not feel well yesterday.  She denies any active pain however she is a poor historian.  She does not complain of any headache chest pain trouble breathing abdominal pain or urinary symptoms.  Exam notable for an elderly female, tremulous, ill-appearing and actively vomiting.  Heart with normal rate and rhythm, lungs are clear abdomen without significant tenderness but patient does have ostomy bag without significant output.  -Labs ordered, independently viewed and interpreted by me.  Labs remarkable for Na+ 121, Cr 2.07 (IVF given).  Mild transaminitis noted.  Normal lipase.  -The patient was maintained on a cardiac monitor.  I personally viewed and interpreted the cardiac monitored which showed an underlying rhythm of: sinus tach -Imaging including acute abdominal series independently view and interpreted by me without air fluid level to suggest sbo.  Will obtain abd/pelvis CT for further assessment. -This patient presents to the ED for concern of n/v, this involves an extensive number of treatment options, and is a complaint that carries with it a high risk of complications and morbidity.  The differential diagnosis includes viral illness, electrolytes abnormality, SBO, appendicitis, colitis -Co morbidities that complicate the patient evaluation includes CKD, essential tremor, SBO,  cancer -Treatment includes IVF, zofran, morphine -Reevaluation of the patient after these medicines showed that the patient improved -PCP office notes or outside notes reviewed -Discussion with specialist [facility] DR. Tu who request CT abd/pelvis to assess for potential SBO. -Escalation to admission/observation considered: dispo pending CT result.  Pt sign out to oncoming provider        Final Clinical Impression(s) / ED Diagnoses Final diagnoses:  Nausea and vomiting, unspecified vomiting type  Hyponatremia    Rx / DC Orders ED Discharge Orders     None         Fayrene Helper, PA-C 06/09/23 2212    Lonell Grandchild, MD 06/10/23 [phone]

## 2023-06-09 NOTE — ED Triage Notes (Signed)
Patient presents due to a sodium level of 119 and a BUN of 124.  Last night she experienced nausea and vomiting that  progressed to dry heaving today. The facility last gave her Zofran around 1100 today, but it did not help. She also complains of a headache which started today. The patient is on continuous TPN/ lipids, has a colostomy and a nephrostomy  EMS vital: 106/70 BP 92 HR 99% SPO2 on room air 18 RR 98.5 Temp 257 CBG

## 2023-06-09 NOTE — Assessment & Plan Note (Signed)
-  secondary to GI bicarb loss -keep on continuous IV fluid

## 2023-06-09 NOTE — ED Provider Notes (Signed)
  Physical Exam  BP 124/83   Pulse 90   Temp 98.6 F (37 C) (Oral)   Resp 17   SpO2 100%   Physical Exam  Procedures  Procedures  ED Course / MDM    Medical Decision Making Amount and/or Complexity of Data Reviewed Labs: ordered. Radiology: ordered. ECG/medicine tests: ordered.  Risk OTC drugs. Prescription drug management. Decision regarding hospitalization.   Patient care assumed at shift handoff from previous provider.  See his note for details.  In short, 67 year old female patient presented to the emergency department via EMS due to abnormal labs.  Nursing facility had blood work done due to patient experiencing nausea and vomiting and was reported to have a sodium of 119 and a BUN of 124.  Lab work here has been remarkable for a sodium of 121 and a BUN of 128.  Admission was discussed with Dr.Tu of the hospitalist service who recommended an abdominal CT to rule out SBO or other surgical emergencies.  CT abdomen pelvis was completed with results: 1. Moderate to marked severity right-sided hydronephrosis and  proximal hydroureter with a percutaneous right-sided nephrostomy  tube and right-sided endo ureteral stent in place.  2. Mild to moderate severity left-sided hydronephrosis and proximal  hydroureter with markedly dilated intrarenal calices on the left.  3. Stable postoperative changes within the posterior aspect of the  pelvis.  4. Stable left adrenal adenoma. No follow-up imaging is recommended.  5. 4 mm noncalcified right lower lobe lung nodule. Correlation with  follow-up nonemergent dedicated chest CT is recommended.  6. Aortic atherosclerosis.    I reached out to Dr. Cyndia Bent and informed her of the results of the CT scan and she agreed to see the patient for admission.       Pamala Duffel 06/09/23 2322    Lonell Grandchild, MD 06/10/23 [phone]

## 2023-06-09 NOTE — H&P (Signed)
History and Physical    Patient: [name] UEA:[id number] DOB: April 21, 1956 DOA: 06/09/2023 DOS: the patient was seen and examined on 06/10/2023 PCP: Patient, [name]  Patient coming from: SNF  Chief Complaint:  Chief Complaint  Patient presents with   Abnormal Lab   HPI: [name] is a 67 y.o. female with medical history significant of cervical cancer and rectovaginal fistula s/p redo low anterior resection with colovaginal fistula takedown, fistula repair, and hand sewn end to end sigmoidrectal anastomosis with loop ileostomy on 11/23/18; closure of loop ileostomy 05/01/20, SBO s/p ex-lap with lysis of adhesions on 11/08/20, recurrent SBO s/p ex-lap, LOA, distal ileum and cecum resection with end ileostomy creation on 03/14/2023 started on TPN, right hydronephrosis s/p R ureteral stent placement on 04/02/2023 with subsequent obstructive uropathy, UTI, AKI and bilateral hydronephrosis with placement of right nephrostomy tube on 05/08/2023 who presents with nausea and vomiting and hyponatremia.   Pt is a limited historian. She was mostly expressing frustration regarding her chronic medical conditions. Husband and daughter at bedside helps to provide hx.  Pt currently at rehab for the past several weeks. Prior to that she lives at home with husband. She is on continuous TPN but still has oral intake. Has been having nausea for several weeks and today had bilious emesis. Also notes decrease ostomy output for days. Complaining of chronic back in her back from nephrostomy tube sutured site. Pt understandable frustrated with her medical conditions and repeated hospitalizations.   In the ED, she was afebrile, normotensive, HR 80s but tachypneic with RR 27.  No leukocytosis or anemia on CBC.   CMP demonstrated hyponatremia of 121 with glucose of 270. Hypochloremia of 88 with CO2 of 18 and normal anion gap of 15. AKI with creatinine of 2.07 with prior of around 1-1.3.  AST elevated at 49, ALT  of 125, Alk phos of 308 with normal total bilirubin.   She was administered 1L of NS, antiemetic and morphine.   CT abd/ pelvis was requested prior to admission and did not reveal SBO. There is moderate to marked severity right sided hydronephrosis and proximal hydroureter with a percutaneous right sided nephrostomy tube and right sided endo ureter stent in place.  Mild to moderate severity left-sided hydronephrosis and proximal hydroureter with markedly dilated intrarenal calices on the left. Review of Systems: As mentioned in the history of present illness. All other systems reviewed and are negative. 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   Thyroid disease    Vertigo    Past Surgical History:  Procedure Laterality Date   ABDOMINAL ADHESION SURGERY  11/08/2020   Dr [name] - [facility]   ABDOMINAL HYSTERECTOMY  2003   [facility] - Dr [name] - for cervical cancer   BILE DUCT EXPLORATION  10/22/2003   CHOLECYSTECTOMY OPEN  10/22/2003   Dr Zachery Dakins - CBD repair w T tube   CYSTOSCOPY  2021   ILEOSTOMY  11/23/2018   ILEOSTOMY CLOSURE  04/2020   LOW ANTERIOR BOWEL RESECTION  11/23/2018   LAR/takedown colovaginal fistula/loop ileostomy  - [facility]   Social History:  reports that she has quit smoking. Her smoking use included cigarettes. She has never used smokeless tobacco. She reports that she does not currently use alcohol. She reports that she does not  use drugs.  Allergies  Allergen Reactions   Barium-Containing Compounds     hives   Percocet [Oxycodone-Acetaminophen] Itching and Other (See Comments)    migraine     Family History  Problem Relation Age of Onset   Heart disease Father    Diabetes Father    Parkinson's disease Neg Hx     Prior to Admission medications   Medication Sig Start Date End Date Taking? Authorizing Provider   acetaminophen (TYLENOL) 500 MG tablet Take 1,000 mg by mouth every 6 (six) hours as needed.    [provider]  levothyroxine (SYNTHROID) 25 MCG tablet Take 1 tablet (25 mcg total) by mouth daily. 07/17/22   [name], DO  metoCLOPramide (REGLAN) 5 MG tablet Take 5 mg by mouth 4 (four) times daily. For 14 days. Stop November 9 Patient not taking: Reported on 04/28/2023    [provider]  mirtazapine (REMERON) 7.5 MG tablet Take 7.5 mg by mouth at bedtime.    [provider]  promethazine (PHENERGAN) 12.5 MG tablet Take 12.5 mg by mouth every 6 (six) hours as needed for nausea or vomiting.    [provider]  simvastatin (ZOCOR) 20 MG tablet Take 1 tablet (20 mg total) by mouth daily at 6 PM. 12/06/22   [name], DO    Physical Exam: Vitals:   06/09/23 2045 06/09/23 2115 06/09/23 2130 06/09/23 2145  BP: 118/85 122/87 124/78 124/83  Pulse: 82 88 90 90  Resp: (!) 22 20 (!) 24 17  Temp:      TempSrc:      SpO2: 100% 100% 100% 100%   Constitutional: NAD, thin frail elderly female lying in bed Eyes: lids and conjunctivae normal ENMT: Mucous membranes are moist.  Neck: normal, supple Respiratory: clear to auscultation bilaterally, no wheezing, no crackles. Normal respiratory effort. No accessory muscle use.  Cardiovascular: Regular rate and rhythm, no murmurs / rubs / gallops. No extremity edema. Abdomen: soft, thin abdomen with ileostomy with normal stoma and liquid stool output. Right nephrostomy tube in place without any erythema or discharge to entry site.  Musculoskeletal: no clubbing / cyanosis. No joint deformity upper and lower extremities.  Normal muscle tone.  Skin: no rashes, lesions, ulcers. No induration Neurologic: CN 2-12 grossly intact.Strength 5/5 in all 4.  Psychiatric: Normal judgment and insight. Alert and oriented x 3. Irritable and frustrated mood.   Data Reviewed:  See HPI  Assessment and Plan: *  Hyponatremia -secondary to decrease intake and likely GI loss -sodium improved following NS bolus. Will continue IV NS gentle hydration with goal Na of < in 24 hours.    Acute-on-chronic kidney injury (HCC) -Hx of bilateral hydronephrosis s/p ureteral stent and right nephrostomy tube  -CT A/P demonstrated moderate to marked severity right sided hydronephrosis and proximal hydroureter with a percutaneous right sided nephrostomy tube and right sided endo ureter stent in place.  Mild to moderate severity left-sided hydronephrosis and proximal hydroureter with markedly dilated intrarenal calices on the left. -follows with [facility]. Unclear etiology of her hydronephrosis.  -will also check urine culture due to hx of recurrent UTI although no leukocytosis or fever so lower suspicion for infection.   Hypothyroidism -continue levothyroxine  Mixed hyperlipidemia -hold statin due to transaminitis  Tremors of nervous system -bilateral UE resting tremors. Potentially secondary to malabsorption.  -check Vitamin B12, folic, Mg, phosphorus and TSH  Metabolic acidosis with normal anion gap and bicarbonate losses -secondary to GI bicarb loss -  keep on continuous IV fluid  Transaminitis -possible transient and due to TPN  -s/p cholecystectomy and no biliary diltation on CT imaging -repeat CMP in the morning  Nausea & vomiting -this has been an ongoing chronic issue for her due to her complex abdominal surgical hx. No SBO demonstrated on CT imaging today.  -PRN antiemetics   Failure to thrive in adult -Pt has extension abdominal surgery due to hx of cervical cancer and currently on TPN -keep on continuous TPN-pharmacy consult placed -pt may benefit from palliative care to discuss goals of care as she is understandably frustrated with repeat hospitalization.       Advance Care Planning:   Code Status: Full Code   Consults: none  Family Communication: daughter and husband at  bedside  Severity of Illness: The appropriate patient status for this patient is OBSERVATION. Observation status is judged to be reasonable and necessary in order to provide the required intensity of service to ensure the patient's safety. The patient's presenting symptoms, physical exam findings, and initial radiographic and laboratory data in the context of their medical condition is felt to place them at decreased risk for further clinical deterioration. Furthermore, it is anticipated that the patient will be medically stable for discharge from the hospital within 2 midnights of admission.   Author: Anselm Jungling, DO 06/10/2023 12:22 AM  For on call review [email or web address].

## 2023-06-09 NOTE — Assessment & Plan Note (Signed)
-  Hx of bilateral hydronephrosis s/p ureteral stent and right nephrostomy tube  -CT A/P demonstrated moderate to marked severity right sided hydronephrosis and proximal hydroureter with a percutaneous right sided nephrostomy tube and right sided endo ureter stent in place.  Mild to moderate severity left-sided hydronephrosis and proximal hydroureter with markedly dilated intrarenal calices on the left. -follows with [facility]. Unclear etiology of her hydronephrosis.  -will also check urine culture due to hx of recurrent UTI although no leukocytosis or fever so lower suspicion for infection.

## 2023-06-10 DIAGNOSIS — R627 Adult failure to thrive: Secondary | ICD-10-CM | POA: Diagnosis present

## 2023-06-10 DIAGNOSIS — R251 Tremor, unspecified: Secondary | ICD-10-CM

## 2023-06-10 DIAGNOSIS — E782 Mixed hyperlipidemia: Secondary | ICD-10-CM | POA: Diagnosis present

## 2023-06-10 DIAGNOSIS — R748 Abnormal levels of other serum enzymes: Secondary | ICD-10-CM | POA: Diagnosis not present

## 2023-06-10 DIAGNOSIS — Z885 Allergy status to narcotic agent status: Secondary | ICD-10-CM | POA: Diagnosis not present

## 2023-06-10 DIAGNOSIS — Z87440 Personal history of urinary (tract) infections: Secondary | ICD-10-CM | POA: Diagnosis not present

## 2023-06-10 DIAGNOSIS — Z9049 Acquired absence of other specified parts of digestive tract: Secondary | ICD-10-CM | POA: Diagnosis not present

## 2023-06-10 DIAGNOSIS — Z1152 Encounter for screening for COVID-19: Secondary | ICD-10-CM | POA: Diagnosis not present

## 2023-06-10 DIAGNOSIS — N1330 Unspecified hydronephrosis: Secondary | ICD-10-CM | POA: Diagnosis present

## 2023-06-10 DIAGNOSIS — N261 Atrophy of kidney (terminal): Secondary | ICD-10-CM | POA: Diagnosis present

## 2023-06-10 DIAGNOSIS — Z8249 Family history of ischemic heart disease and other diseases of the circulatory system: Secondary | ICD-10-CM | POA: Diagnosis not present

## 2023-06-10 DIAGNOSIS — F17210 Nicotine dependence, cigarettes, uncomplicated: Secondary | ICD-10-CM | POA: Diagnosis present

## 2023-06-10 DIAGNOSIS — F32A Depression, unspecified: Secondary | ICD-10-CM | POA: Diagnosis present

## 2023-06-10 DIAGNOSIS — E871 Hypo-osmolality and hyponatremia: Secondary | ICD-10-CM

## 2023-06-10 DIAGNOSIS — N179 Acute kidney failure, unspecified: Secondary | ICD-10-CM | POA: Diagnosis present

## 2023-06-10 DIAGNOSIS — N1831 Chronic kidney disease, stage 3a: Secondary | ICD-10-CM | POA: Diagnosis present

## 2023-06-10 DIAGNOSIS — Z8541 Personal history of malignant neoplasm of cervix uteri: Secondary | ICD-10-CM | POA: Diagnosis not present

## 2023-06-10 DIAGNOSIS — E8721 Acute metabolic acidosis: Secondary | ICD-10-CM | POA: Diagnosis present

## 2023-06-10 DIAGNOSIS — E878 Other disorders of electrolyte and fluid balance, not elsewhere classified: Secondary | ICD-10-CM | POA: Diagnosis present

## 2023-06-10 DIAGNOSIS — F419 Anxiety disorder, unspecified: Secondary | ICD-10-CM | POA: Diagnosis present

## 2023-06-10 DIAGNOSIS — Z833 Family history of diabetes mellitus: Secondary | ICD-10-CM | POA: Diagnosis not present

## 2023-06-10 DIAGNOSIS — G250 Essential tremor: Secondary | ICD-10-CM | POA: Diagnosis present

## 2023-06-10 DIAGNOSIS — E039 Hypothyroidism, unspecified: Secondary | ICD-10-CM | POA: Diagnosis present

## 2023-06-10 DIAGNOSIS — E876 Hypokalemia: Secondary | ICD-10-CM | POA: Diagnosis not present

## 2023-06-10 DIAGNOSIS — Z751 Person awaiting admission to adequate facility elsewhere: Secondary | ICD-10-CM | POA: Diagnosis not present

## 2023-06-10 DIAGNOSIS — Z79890 Hormone replacement therapy (postmenopausal): Secondary | ICD-10-CM | POA: Diagnosis not present

## 2023-06-10 LAB — MRSA NEXT GEN BY PCR, NASAL: MRSA by PCR Next Gen: DETECTED — AB

## 2023-06-10 LAB — GLUCOSE, CAPILLARY: Glucose-Capillary: 162 mg/dL — ABNORMAL HIGH (ref 70–99)

## 2023-06-10 LAB — BASIC METABOLIC PANEL
Anion gap: 10 (ref 5–15)
BUN: 95 mg/dL — ABNORMAL HIGH (ref 8–23)
CO2: 19 mmol/L — ABNORMAL LOW (ref 22–32)
Calcium: 9 mg/dL (ref 8.9–10.3)
Chloride: 101 mmol/L (ref 98–111)
Creatinine, Ser: 1.44 mg/dL — ABNORMAL HIGH (ref 0.44–1.00)
GFR, Estimated: 40 mL/min — ABNORMAL LOW (ref >60)
Glucose, Bld: 196 mg/dL — ABNORMAL HIGH (ref 70–99)
Potassium: 4.5 mmol/L (ref 3.5–5.1)
Sodium: 130 mmol/L — ABNORMAL LOW (ref 135–145)

## 2023-06-10 LAB — URINALYSIS, ROUTINE W REFLEX MICROSCOPIC
Bilirubin Urine: NEGATIVE
Glucose, UA: NEGATIVE mg/dL
Ketones, ur: NEGATIVE mg/dL
Nitrite: POSITIVE — AB
Protein, ur: 30 mg/dL — AB
Specific Gravity, Urine: 1.016 (ref 1.005–1.030)
WBC, UA: >50 WBC/hpf (ref 0–5)
pH: 6 (ref 5.0–8.0)

## 2023-06-10 LAB — CBC
HCT: 36.1 % (ref 36.0–46.0)
Hemoglobin: 12.2 g/dL (ref 12.0–15.0)
MCH: 29.7 pg (ref 26.0–34.0)
MCHC: 33.8 g/dL (ref 30.0–36.0)
MCV: 87.8 fL (ref 80.0–100.0)
Platelets: 217 10*3/uL (ref 150–400)
RBC: 4.11 MIL/uL (ref 3.87–5.11)
RDW: 14.8 % (ref 11.5–15.5)
WBC: 7.3 10*3/uL (ref 4.0–10.5)
nRBC: 0 % (ref 0.0–0.2)

## 2023-06-10 LAB — COMPREHENSIVE METABOLIC PANEL
ALT: 109 U/L — ABNORMAL HIGH (ref 0–44)
AST: 50 U/L — ABNORMAL HIGH (ref 15–41)
Albumin: 3.2 g/dL — ABNORMAL LOW (ref 3.5–5.0)
Alkaline Phosphatase: 269 U/L — ABNORMAL HIGH (ref 38–126)
Anion gap: 12 (ref 5–15)
BUN: 114 mg/dL — ABNORMAL HIGH (ref 8–23)
CO2: 19 mmol/L — ABNORMAL LOW (ref 22–32)
Calcium: 9.6 mg/dL (ref 8.9–10.3)
Chloride: 97 mmol/L — ABNORMAL LOW (ref 98–111)
Creatinine, Ser: 1.81 mg/dL — ABNORMAL HIGH (ref 0.44–1.00)
GFR, Estimated: 30 mL/min — ABNORMAL LOW (ref >60)
Glucose, Bld: 118 mg/dL — ABNORMAL HIGH (ref 70–99)
Potassium: 4.5 mmol/L (ref 3.5–5.1)
Sodium: 128 mmol/L — ABNORMAL LOW (ref 135–145)
Total Bilirubin: 0.6 mg/dL (ref <1.2)
Total Protein: 7 g/dL (ref 6.5–8.1)

## 2023-06-10 LAB — FOLATE: Folate: 8.9 ng/mL (ref >5.9)

## 2023-06-10 LAB — VITAMIN B12: Vitamin B-12: 601 pg/mL (ref 180–914)

## 2023-06-10 LAB — TSH: TSH: 0.382 u[IU]/mL (ref 0.350–4.500)

## 2023-06-10 LAB — PHOSPHORUS: Phosphorus: 4 mg/dL (ref 2.5–4.6)

## 2023-06-10 LAB — MAGNESIUM: Magnesium: 2.5 mg/dL — ABNORMAL HIGH (ref 1.7–2.4)

## 2023-06-10 MED ORDER — CHLORHEXIDINE GLUCONATE CLOTH 2 % EX PADS
6.0000 | MEDICATED_PAD | Freq: Every day | CUTANEOUS | Status: AC
  Administered 2023-06-10 – 2023-06-14 (×5): 6 via TOPICAL

## 2023-06-10 MED ORDER — PANTOPRAZOLE SODIUM 40 MG PO TBEC
40.0000 mg | DELAYED_RELEASE_TABLET | Freq: Every day | ORAL | Status: DC
  Administered 2023-06-10 – 2023-06-24 (×15): 40 mg via ORAL
  Filled 2023-06-10 (×15): qty 1

## 2023-06-10 MED ORDER — FAT EMUL FISH OIL/PLANT BASED 20% (SMOFLIPID)IV EMUL: 250.0000 mL | INTRAVENOUS | Status: DC

## 2023-06-10 MED ORDER — MUPIROCIN 2 % EX OINT
1.0000 | TOPICAL_OINTMENT | Freq: Two times a day (BID) | CUTANEOUS | Status: AC
  Administered 2023-06-10 – 2023-06-14 (×10): 1 via NASAL
  Filled 2023-06-10: qty 22

## 2023-06-10 MED ORDER — ONDANSETRON HCL 4 MG/2ML IJ SOLN
4.0000 mg | Freq: Four times a day (QID) | INTRAMUSCULAR | Status: DC | PRN
  Administered 2023-06-13 – 2023-06-15 (×2): 4 mg via INTRAVENOUS
  Filled 2023-06-10 (×2): qty 2

## 2023-06-10 MED ORDER — ENSURE ENLIVE PO LIQD
237.0000 mL | Freq: Two times a day (BID) | ORAL | Status: DC
  Administered 2023-06-10 – 2023-06-20 (×17): 237 mL via ORAL

## 2023-06-10 MED ORDER — SODIUM CHLORIDE 0.9% FLUSH
10.0000 mL | Freq: Two times a day (BID) | INTRAVENOUS | Status: DC
Start: 2023-06-10 — End: 2023-06-24
  Administered 2023-06-10 – 2023-06-23 (×9): 10 mL

## 2023-06-10 MED ORDER — DIVALPROEX SODIUM 125 MG PO DR TAB
125.0000 mg | DELAYED_RELEASE_TABLET | Freq: Every day | ORAL | Status: DC
  Administered 2023-06-10 – 2023-06-13 (×5): 125 mg via ORAL
  Filled 2023-06-10 (×5): qty 1

## 2023-06-10 MED ORDER — THIAMINE MONONITRATE 100 MG PO TABS
100.0000 mg | ORAL_TABLET | Freq: Every day | ORAL | Status: DC
  Administered 2023-06-10 – 2023-06-24 (×15): 100 mg via ORAL
  Filled 2023-06-10 (×15): qty 1

## 2023-06-10 MED ORDER — MORPHINE SULFATE (PF) 2 MG/ML IV SOLN
2.0000 mg | Freq: Once | INTRAVENOUS | Status: AC
  Administered 2023-06-10: 2 mg via INTRAVENOUS
  Filled 2023-06-10: qty 1

## 2023-06-10 MED ORDER — SERTRALINE HCL 25 MG PO TABS
25.0000 mg | ORAL_TABLET | Freq: Every day | ORAL | Status: DC
  Administered 2023-06-10 – 2023-06-15 (×6): 25 mg via ORAL
  Filled 2023-06-10 (×6): qty 1

## 2023-06-10 MED ORDER — HALOPERIDOL LACTATE 5 MG/ML IJ SOLN
1.0000 mg | Freq: Once | INTRAMUSCULAR | Status: AC
  Administered 2023-06-10: 1 mg via INTRAVENOUS
  Filled 2023-06-10: qty 1

## 2023-06-10 MED ORDER — SODIUM CHLORIDE 0.9 % IV SOLN
INTRAVENOUS | Status: AC
  Administered 2023-06-10: 75 mL/h via INTRAVENOUS

## 2023-06-10 MED ORDER — ORAL CARE MOUTH RINSE: 15.0000 mL | OROMUCOSAL | Status: DC | PRN

## 2023-06-10 MED ORDER — INSULIN ASPART 100 UNIT/ML IJ SOLN
0.0000 [IU] | Freq: Four times a day (QID) | INTRAMUSCULAR | Status: DC
  Administered 2023-06-10 – 2023-06-11 (×2): 2 [IU] via SUBCUTANEOUS
  Administered 2023-06-11 – 2023-06-12 (×4): 1 [IU] via SUBCUTANEOUS
  Administered 2023-06-12 – 2023-06-13 (×4): 2 [IU] via SUBCUTANEOUS
  Administered 2023-06-13: 1 [IU] via SUBCUTANEOUS
  Administered 2023-06-13: 2 [IU] via SUBCUTANEOUS
  Administered 2023-06-14 (×2): 1 [IU] via SUBCUTANEOUS
  Administered 2023-06-15: 2 [IU] via SUBCUTANEOUS
  Administered 2023-06-15: 5 [IU] via SUBCUTANEOUS
  Administered 2023-06-15: 1 [IU] via SUBCUTANEOUS
  Administered 2023-06-15: 3 [IU] via SUBCUTANEOUS
  Administered 2023-06-16: 2 [IU] via SUBCUTANEOUS
  Administered 2023-06-16: 3 [IU] via SUBCUTANEOUS
  Administered 2023-06-16 (×2): 1 [IU] via SUBCUTANEOUS
  Administered 2023-06-17: 3 [IU] via SUBCUTANEOUS
  Administered 2023-06-17 – 2023-06-19 (×7): 2 [IU] via SUBCUTANEOUS
  Administered 2023-06-20: 3 [IU] via SUBCUTANEOUS
  Administered 2023-06-20: 2 [IU] via SUBCUTANEOUS
  Administered 2023-06-20: 1 [IU] via SUBCUTANEOUS
  Administered 2023-06-21: 3 [IU] via SUBCUTANEOUS
  Administered 2023-06-21: 1 [IU] via SUBCUTANEOUS
  Administered 2023-06-21 (×2): 3 [IU] via SUBCUTANEOUS
  Administered 2023-06-22: 2 [IU] via SUBCUTANEOUS
  Administered 2023-06-22: 1 [IU] via SUBCUTANEOUS
  Administered 2023-06-22: 5 [IU] via SUBCUTANEOUS
  Administered 2023-06-22: 3 [IU] via SUBCUTANEOUS
  Administered 2023-06-23: 5 [IU] via SUBCUTANEOUS
  Administered 2023-06-23: 2 [IU] via SUBCUTANEOUS

## 2023-06-10 MED ORDER — CARMEX CLASSIC LIP BALM EX OINT
TOPICAL_OINTMENT | CUTANEOUS | Status: DC | PRN
  Administered 2023-06-10: 1 via TOPICAL
  Filled 2023-06-10 (×4): qty 10

## 2023-06-10 MED ORDER — TRACE MINERALS CU-MN-SE-ZN 300-55-60-3000 MCG/ML IV SOLN: INTRAVENOUS | Status: DC

## 2023-06-10 MED ORDER — ENOXAPARIN SODIUM 30 MG/0.3ML IJ SOSY
30.0000 mg | PREFILLED_SYRINGE | INTRAMUSCULAR | Status: DC
  Administered 2023-06-10: 30 mg via SUBCUTANEOUS
  Filled 2023-06-10: qty 0.3

## 2023-06-10 MED ORDER — LEVOTHYROXINE SODIUM 25 MCG PO TABS
25.0000 ug | ORAL_TABLET | Freq: Every day | ORAL | Status: DC
  Administered 2023-06-10 – 2023-06-24 (×15): 25 ug via ORAL
  Filled 2023-06-10 (×15): qty 1

## 2023-06-10 MED ORDER — FAT EMUL FISH OIL/PLANT BASED 20% (SMOFLIPID)IV EMUL
250.0000 mL | INTRAVENOUS | Status: AC
Start: 2023-06-10 — End: 2023-06-11
  Administered 2023-06-10: 250 mL via INTRAVENOUS
  Filled 2023-06-10: qty 250

## 2023-06-10 MED ORDER — MIRTAZAPINE 15 MG PO TABS
7.5000 mg | ORAL_TABLET | Freq: Every day | ORAL | Status: DC
  Administered 2023-06-10 – 2023-06-23 (×15): 7.5 mg via ORAL
  Filled 2023-06-10 (×15): qty 1

## 2023-06-10 MED ORDER — TRACE MINERALS CU-MN-SE-ZN 300-55-60-3000 MCG/ML IV SOLN
INTRAVENOUS | Status: AC
  Filled 2023-06-10 (×2): qty 1000

## 2023-06-10 NOTE — Care Management Obs Status (Signed)
MEDICARE OBSERVATION STATUS NOTIFICATION   Patient Details  Name: [name] MRN: [id number] Date of Birth: Nov 21, 1955   Medicare Observation Status Notification Given:  Yes    Larrie Kass, LCSW 06/10/2023, 2:08 PM

## 2023-06-10 NOTE — Evaluation (Signed)
Clinical/Bedside Swallow Evaluation Patient Details  Name: [name] MRN: [id number] Date of Birth: 08-29-1955  Today's Date: 06/10/2023 Time: SLP Start Time (ACUTE ONLY): 1440 SLP Stop Time (ACUTE ONLY): 1450 SLP Time Calculation (min) (ACUTE ONLY): 10 min  Past Medical History:  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   Thyroid disease    Vertigo    Past Surgical History:  Past Surgical History:  Procedure Laterality Date   ABDOMINAL ADHESION SURGERY  11/08/2020   Dr [name] - [facility]   ABDOMINAL HYSTERECTOMY  2003   [facility] - Dr [name] - for cervical cancer   BILE DUCT EXPLORATION  10/22/2003   CHOLECYSTECTOMY OPEN  10/22/2003   Dr Zachery Dakins - CBD repair w T tube   CYSTOSCOPY  2021   ILEOSTOMY  11/23/2018   ILEOSTOMY CLOSURE  04/2020   LOW ANTERIOR BOWEL RESECTION  11/23/2018   LAR/takedown colovaginal fistula/loop ileostomy  - [facility]   HPI:  Patient is a 67 y.o. female with PMH: cervical cancer and rectovaginal fistula s/p redo low anterior resection with colovaginal fistula takedown, fistula repair, and hand sewn end to end sigmoidrectal anastomosis with loop ileostomy on 11/23/18; closure of loop ileostomy 05/01/20, SBO s/p ex-lap with lysis of adhesions on 11/08/20, recurrent SBO s/p ex-lap, LOA, distal ileum and cecum resection with end ileostomy creation on 03/14/2023 started on TPN, right hydronephrosis s/p R ureteral stent placement on 04/02/2023 with subsequent obstructive uropathy, UTI, AKI and bilateral hydronephrosis with placement of right nephrostomy tube on 05/08/2023. She is on home TPN. She presented to the hospital on 06/10/2023 with nausea and vomitting and hyponatremia. SLP swallow evaluation ordered.    Assessment / Plan / Recommendation  Clinical Impression  Patient is not currently presenting with clinical s/s  of dysphagia as per this bedside swallow evaluation. SLP assessed her swallow function via sips of Ensure shake and one of her medication tablets. Swallow initiation appeared timely and no overt s/s aspiration. Patient denies any h/o dysphagia and denies any unintentional weight loss. She reported that she has been maintining her weight of 106 lbs and she is happy with it. SLP not recommending further skilled intervention at this time. SLP Visit Diagnosis: Dysphagia, unspecified (R13.10)    Aspiration Risk  No limitations    Diet Recommendation Regular;Thin liquid    Medication Administration: Whole meds with liquid Supervision: Patient able to self feed Postural Changes: Seated upright at 90 degrees    Other  Recommendations Oral Care Recommendations: Oral care BID;Patient independent with oral care    Recommendations for follow up therapy are one component of a multi-disciplinary discharge planning process, led by the attending physician.  Recommendations may be updated based on patient status, additional functional criteria and insurance authorization.  Follow up Recommendations No SLP follow up      Assistance Recommended at Discharge    Functional Status Assessment Patient has not had a recent decline in their functional status  Frequency and Duration     N/A       Prognosis   N/A     Swallow Study   General Date of Onset: 06/10/23 HPI: Patient is a 67 y.o. female with PMH: cervical cancer and rectovaginal fistula s/p redo low anterior resection with colovaginal fistula takedown, fistula repair, and hand sewn end to  end sigmoidrectal anastomosis with loop ileostomy on 11/23/18; closure of loop ileostomy 05/01/20, SBO s/p ex-lap with lysis of adhesions on 11/08/20, recurrent SBO s/p ex-lap, LOA, distal ileum and cecum resection with end ileostomy creation on 03/14/2023 started on TPN, right hydronephrosis s/p R ureteral stent placement on 04/02/2023 with subsequent obstructive  uropathy, UTI, AKI and bilateral hydronephrosis with placement of right nephrostomy tube on 05/08/2023. She is on home TPN. She presented to the hospital on 06/10/2023 with nausea and vomitting and hyponatremia. SLP swallow evaluation ordered. Type of Study: Bedside Swallow Evaluation Previous Swallow Assessment: none found Diet Prior to this Study: Regular;Thin liquids (Level 0) Temperature Spikes Noted: No Respiratory Status: Room air Behavior/Cognition: Alert;Cooperative;Pleasant mood Oral Cavity Assessment: Within Functional Limits Oral Care Completed by SLP: No Oral Cavity - Dentition: Adequate natural dentition Vision: Functional for self-feeding Self-Feeding Abilities: Able to feed self Patient Positioning: Upright in bed Baseline Vocal Quality: Normal Volitional Swallow: Able to elicit    Oral/Motor/Sensory Function Overall Oral Motor/Sensory Function: Within functional limits   Ice Chips     Thin Liquid Thin Liquid: Within functional limits Presentation: Self Fed;Straw    Nectar Thick     Honey Thick     Puree Puree: Not tested   Solid     Solid: Not tested     Angela Nevin, MA, CCC-SLP Speech Therapy

## 2023-06-10 NOTE — TOC Initial Note (Signed)
Transition of Care [facility]) - Initial/Assessment Note    Patient Details  Name: [name] MRN: [id number] Date of Birth: 1956/04/20  [facility] CM/SW Contact:    Larrie Kass, LCSW Phone Number: 06/10/2023, 2:10 PM  Clinical Narrative:                Pt is from [facility] as a Short term rehab resident. Pt's husband reports they would like a facility closer to where they live. CSW explained pt will need new insurance auth and rehab will need to be recommended by PT. TOC will follow.    Expected Discharge Plan: Skilled Nursing Facility Barriers to Discharge: Continued Medical Work up   Patient Goals and CMS Choice Patient states their goals for this hospitalization and ongoing recovery are:: retrun to rehab          Expected Discharge Plan and Services       Living arrangements for the past 2 months: Single Family Home                                      Prior Living Arrangements/Services Living arrangements for the past 2 months: Single Family Home Lives with:: Self, Spouse   Do you feel safe going back to the place where you live?: Yes      Need for Family Participation in Patient Care: Yes (Comment) Care giver support system in place?: Yes (comment)      Activities of Daily Living   ADL Screening (condition at time of admission) Independently performs ADLs?: No Does the patient have a NEW difficulty with bathing/dressing/toileting/self-feeding that is expected to last >3 days?: Yes (Initiates electronic notice to provider for possible OT consult) Does the patient have a NEW difficulty with getting in/out of bed, walking, or climbing stairs that is expected to last >3 days?: Yes (Initiates electronic notice to provider for possible PT consult) Does the patient have a NEW difficulty with communication that is expected to last >3 days?: Yes (Initiates electronic notice to provider for possible SLP consult) Is the patient  deaf or have difficulty hearing?: No Does the patient have difficulty seeing, even when wearing glasses/contacts?: No Does the patient have difficulty concentrating, remembering, or making decisions?: Yes  Permission Sought/Granted                  Emotional Assessment Appearance:: Appears stated age     Orientation: : Oriented to Self   Psych Involvement: No (comment)  Admission diagnosis:  Hyponatremia [E87.1] Nausea and vomiting, unspecified vomiting type [R11.2] Patient Active Problem List   Diagnosis Date Noted   Tremors of nervous system 06/10/2023   Hyponatremia 06/09/2023   Failure to thrive in adult 06/09/2023   Nausea & vomiting 06/09/2023   Transaminitis 06/09/2023   Metabolic acidosis with normal anion gap and bicarbonate losses 06/09/2023   Bilateral hydronephrosis 06/09/2023   Subacute confusional state 11/24/2022   Memory loss 11/24/2022   Hypokalemia 11/19/2022   Renal atrophy, left 11/19/2022   Acute-on-chronic kidney injury (HCC) 11/18/2022   SBO (small bowel obstruction) (HCC) 11/17/2022   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   Seborrheic dermatitis 11/04/2022   Senile purpura (HCC) 11/04/2022   Telogen effluvium 11/04/2022   Neck pain 05/07/2022   Right ear pain 05/07/2022   Tympanic membrane perforation, marginal, right 02/27/2022   CKD (chronic kidney disease) stage 3, GFR 30-59 ml/min (HCC) 05/24/2021  Dysuria 04/16/2021   Vaginal irritation 04/16/2021   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   Imbalance 04/10/2021   Tinnitus aurium, right 04/10/2021   Non-recurrent acute serous otitis media of right ear 12/13/2020   Unspecified intestinal obstruction, unspecified as to partial versus complete obstruction (HCC) 10/27/2020   Constipation 10/25/2020   Nausea 10/25/2020   Hypothyroidism 06/24/2018   Smoker 06/24/2018   History of cervical cancer 06/24/2018   Mixed hyperlipidemia 06/24/2018   Essential tremor 05/08/2018    Chronic daily headache 05/06/2018   Lumbar spine painful on movement 05/06/2018   Vertigo 05/06/2018   Leg cramps 05/06/2018   Allergic rhinitis 04/09/2017   Anxiety 04/09/2017   Need for immunization against influenza 04/09/2017   Annual physical exam 04/09/2017   PCP:  Patient, [name] Pharmacy:   [facility] [place], [place] - [address] [place] [address] Phone: [phone] Fax: [phone]  [facility] [address] [place] [address] [place] [address] [place] [address] Phone: [phone] Fax: [phone]     Social Drivers of Health (SDOH) Social History: SDOH Screenings   Food Insecurity: No Food Insecurity (06/10/2023)  Housing: Low Risk  (06/10/2023)  Transportation Needs: No Transportation Needs (06/10/2023)  Utilities: Not At Risk (06/10/2023)  Depression (PHQ2-9): Low Risk  (12/06/2022)  Financial Resource Strain: Low Risk  (11/06/2022)  Stress: No Stress Concern Present (11/06/2022)  Tobacco Use: Medium Risk (06/09/2023)   SDOH Interventions:     Readmission Risk Interventions    11/21/2022   10:10 AM 11/18/2022    2:11 PM  Readmission Risk Prevention Plan  Post Dischage Appt  Complete  Medication Screening  Complete  Transportation Screening Complete Complete  HRI or Home Care Consult Complete   Social Work Consult for Recovery Care Planning/Counseling Complete   Palliative Care Screening Not Applicable   Medication Review Oceanographer) Complete

## 2023-06-10 NOTE — Progress Notes (Signed)
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   Assessment:  67 y/o F with PMH of cervical cancer and rectovaginal fistula s/p redo low anterior resection with colovaginal fistula takedown, fistula repair, and hand sewn end to end sigmoidrectal anastomosis with loop ileostomy on 11/23/18; closure of loop ileostomy 05/01/20, SBO s/p ex-lap with lysis of adhesions on 11/08/20, recurrent SBO s/p ex-lap, LOA, distal ileum and cecum resection with end ileostomy creation on 03/14/2023 started on TPN, right hydronephrosis s/p R ureteral stent placement on 04/02/2023 with subsequent obstructive uropathy, UTI, AKI and bilateral hydronephrosis with placement of right nephrostomy tube on 05/08/2023 who presents with nausea/vomiting and hyponatremia. Pharmacy consulted to resume TPN on admission.   Glucose / Insulin: Glucose 118 on CMET this AM. No history of DM.  Electrolytes: Na low but increased 121 > 128. Mg slightly elevated at 2.5. Chloride and CO2 low. Others, including Corrected Ca, WNL.  Renal: AKI on CKD stage IIIa - SCr improved 2.07 > 1.81. BUN elevated but improved to 114.  Hepatic: AST/ALT, Alk Phos elevated. Tbili WNL. Triglycerides ordered for AM.  Intake / Output; MIVF: I/O + 518 mL. MIVF of NS at 75 mL/hr GI Imaging: 12/23 CT abdomen and pelvis: did not show SBO but showed moderate to marked severity right-sided hydronephrosis and proximal hydroureter with percutaneous right-sided nephrostomy tube and right sided intraureteral stent in place along with mild to moderate severity left-sided hydronephrosis and proximal hydroureter with markedly dilated intrarenal calyces on the left.  GI Surgeries / Procedures: see above  Central  access: PICC line in place TPN start date: continued from PTA  Nutritional Goals: Due to the Baxter IV fluid disruption, all adult parenteral nutrition will utilize premade Clinimix products +/- fat emulsion infusion. Our goal will be to continue providing as close to 100% of our patient's nutritional needs, but due to limited TPN Clinimix concentrations we will meet at least 80% of protein and 75% of kcal goals.  Per discussion with [employer] and [employer], patient was receiving Clinimix E 5/15 1000 mL over 24 hours and Intralipid 20% 250 mL over 12 hours while at facility   -Goal TPN rate (Clinimix 8/10) is 42 mL/hr (provides 80g of protein and 1164 kcals per day) - meets 100% of protein goal and 78% of kcal goals with TPN alone  -Also, each Ensure Plus supplement (ordered BID) provides 350 kcal and 20g of protein     RD Assessment: Estimated Needs Total Energy Estimated Needs: 1500-1700 Total Protein Estimated Needs: 75-90g Total Fluid Estimated Needs: 1.7L/day  Current Nutrition:  Regular diet Ensure Plus BID TPN to resume this PM  Plan:  Start Clinimix E 8/10 at 23mL/hr at 1800 Electrolytes in TPN (fixed; unable to adjust in premixed Clinimix): Na 11mEq/L, K 31mEq/L, Ca 60mEq/L, Mg 82mEq/L, Phos 54mmol/L, Acetate 83 mEq/L, Cl 76 mEq/L Aware that Mg level slightly elevated - discussed with MD. Given small amount of Mg in premixed TPN and based on product availability, will continue with electrolyte-containing formulation for now. If Mg level significantly elevated tomorrow, will consider using the larger 2L electrolyte-free product.  Add standard MVI and trace elements to TPN SMOFlipid 20% IV over 12 hours Initiate Sensitive q6h SSI and adjust  as needed  Thiamine 100mg  PO daily per RD recommendations MIVF management per MD Monitor TPN labs on Mon/Thurs CMET, Mg, Phos, Triglycerides in Am   Greer Pickerel, PharmD, BCPS Clinical Pharmacist 06/10/2023,12:14  PM

## 2023-06-10 NOTE — Assessment & Plan Note (Signed)
continue levothyroxine

## 2023-06-10 NOTE — Assessment & Plan Note (Signed)
-  hold statin due to transaminitis

## 2023-06-10 NOTE — Assessment & Plan Note (Addendum)
-  bilateral UE resting tremors. Potentially secondary to malabsorption.  -check Vitamin B12, folic, Mg, phosphorus and TSH

## 2023-06-10 NOTE — Assessment & Plan Note (Signed)
-  possible transient and due to TPN  -s/p cholecystectomy and no biliary diltation on CT imaging -repeat CMP in the morning

## 2023-06-10 NOTE — Progress Notes (Signed)
PROGRESS NOTE    [name]  LPF:[id number] DOB: May 27, 1956 DOA: 06/09/2023 PCP: Patient, [name]   Brief Narrative:  67 y.o. female with medical history significant of cervical cancer and rectovaginal fistula s/p redo low anterior resection with colovaginal fistula takedown, fistula repair, and hand sewn end to end sigmoidrectal anastomosis with loop ileostomy on 11/23/18; closure of loop ileostomy 05/01/20, SBO s/p ex-lap with lysis of adhesions on 11/08/20, recurrent SBO s/p ex-lap, LOA, distal ileum and cecum resection with end ileostomy creation on 03/14/2023 started on TPN, right hydronephrosis s/p R ureteral stent placement on 04/02/2023 with subsequent obstructive uropathy, UTI, AKI and bilateral hydronephrosis with placement of right nephrostomy tube on 05/08/2023 presented with nausea, vomiting and hyponatremia from SNF.  Patient has been on TPN recently after her recent hospitalization at [facility] and discharged to SNF.  On presentation, she was tachypneic with sodium of 121 (last sodium on 05/24/2023 in care everywhere was 130).  Creatinine was 2.07 (last creatinine was 1.09 on 05/24/2023) with bicarb of 18.  CT of abdomen and pelvis did not show SBO but showed moderate to marked severity right-sided hydronephrosis and proximal hydroureter with percutaneous right-sided nephrostomy tube and right sided intraureteral stent in place along with mild to moderate severity left-sided hydronephrosis and proximal hydroureter with markedly dilated intrarenal calyces on the left.  She was started on IV fluids.  Assessment & Plan:   Hyponatremia -Possibly due to decreased oral intake and likely GI loss. -Sodium 121 on presentation.  Currently on gentle hydration.  Improving to 128.  Will check sodium again this morning.  Continue gentle hydration.  Acute kidney injury on chronic kidney disease stage IIIa Acute metabolic acidosis History of bilateral hydronephrosis status  post ureteral stent and right nephrostomy tube -Presented with creatinine of 2.07, last creatinine was 1.09 on 05/24/2023.  Creatinine 1.81 this morning.  Monitor.  Bicarb improving.  Monitor.  Continue gentle hydration. -Imaging as above.  Will reach out to on-call urology regarding guidance. -Check UA  Hypothyroidism -Continue levothyroxine  Hyperlipidemia -Hold statin due to transaminitis  Transaminitis -LFTs mildly elevated.  CT imaging showed no biliary dilatation.  LFTs improving.  Resume TPN  Nausea and vomiting -Has been an ongoing chronic issue for her due to her complex abdominal surgical history.  No small bowel obstruction demonstrated on CT imaging on presentation.  Continue as needed antiemetics.  Outpatient follow-up with primary surgical team  Failure to thrive in adult --Pt has extension abdominal surgery due to hx of cervical cancer and currently on TPN  -Palliative care consultation for goals of care discussion.  History of cervical cancer and rectovaginal fistula status post surgical intervention -Currently has an ileostomy.  Outpatient follow-up with oncology/GYN oncology/general surgery  Anxiety/depression -Continue sertraline and mirtazapine   DVT prophylaxis: Lovenox Code Status: Full Family Communication: Husband at bedside Disposition Plan: Status is: Observation The patient will require care spanning > 2 midnights and should be moved to inpatient because: Of severity of illness  Consultants: Will consult urology  Procedures: None  Antimicrobials: None   Subjective: Patient seen and examined at bedside.  Does not participate in conversation much.  Denies any current nausea or vomiting.  Denies worsening abdominal pain.  No fever, seizures or agitation reported.  Objective: Vitals:   06/10/23 0106 06/10/23 0200 06/10/23 0505 06/10/23 0748  BP: 96/67  103/72 112/69  Pulse: 80  80 75  Resp: 19  19 18   Temp: 97.6 F (36.4 C)  97.6 F (36.4 C)  98 F (36.7 C)  TempSrc: Oral  Oral Oral  SpO2: 100%  98% 100%  Weight:  49.2 kg    Height:  5\' 2"  (1.575 m)      Intake/Output Summary (Last 24 hours) at 06/10/2023 1142 Last data filed at 06/10/2023 0748 Gross per 24 hour  Intake 1288.43 ml  Output 650 ml  Net 638.43 ml   Filed Weights   06/10/23 0200  Weight: 49.2 kg    Examination:  General exam: Appears calm and comfortable.  Looks chronically ill and deconditioned. Respiratory system: Bilateral decreased breath sounds at bases with some scattered crackles Cardiovascular system: S1 & S2 heard, Rate controlled Gastrointestinal system: Abdomen is distended, soft and mildly tender.  Ileostomy bag present with liquid stool.  Normal bowel sounds heard. Genitourinary: Right nephrostomy tube in place Extremities: No cyanosis, clubbing, edema  Central nervous system: Alert and oriented.  Slow to respond.  Poor historian.  No focal neurological deficits. Moving extremities Skin: No rashes, lesions or ulcers Psychiatry: Flat affect.  Does not participate in conversation much.  Not agitated.   Data Reviewed: I have personally reviewed following labs and imaging studies  CBC: Recent Labs  Lab 06/09/23 2031 06/10/23 0419  WBC 9.1 7.3  NEUTROABS 8.3*  --   HGB 12.9 12.2  HCT 36.0 36.1  MCV 82.9 87.8  PLT 284 217   Basic Metabolic Panel: Recent Labs  Lab 06/09/23 2031 06/09/23 2220 06/10/23 0418 06/10/23 0419  NA 121* 123*  --  128*  K 4.8  --   --  4.5  CL 88*  --   --  97*  CO2 18*  --   --  19*  GLUCOSE 270*  --   --  118*  BUN 128*  --   --  114*  CREATININE 2.07*  --   --  1.81*  CALCIUM 10.1  --   --  9.6  MG  --   --  2.5*  --   PHOS  --   --  4.0  --    GFR: Estimated Creatinine Clearance: 23.4 mL/min (A) (by C-G formula based on SCr of 1.81 mg/dL (H)). Liver Function Tests: Recent Labs  Lab 06/09/23 2031 06/10/23 0419  AST 49* 50*  ALT 125* 109*  ALKPHOS 308* 269*  BILITOT 0.5 0.6  PROT 8.4*  7.0  ALBUMIN 3.6 3.2*   Recent Labs  Lab 06/09/23 2031  LIPASE 48   No results for input(s): "AMMONIA" in the last 168 hours. Coagulation Profile: No results for input(s): "INR", "PROTIME" in the last 168 hours. Cardiac Enzymes: No results for input(s): "CKTOTAL", "CKMB", "CKMBINDEX", "TROPONINI" in the last 168 hours. BNP (last 3 results) No results for input(s): "PROBNP" in the last 8760 hours. HbA1C: No results for input(s): "HGBA1C" in the last 72 hours. CBG: No results for input(s): "GLUCAP" in the last 168 hours. Lipid Profile: No results for input(s): "CHOL", "HDL", "LDLCALC", "TRIG", "CHOLHDL", "LDLDIRECT" in the last 72 hours. Thyroid Function Tests: Recent Labs    06/10/23 0419  TSH 0.382   Anemia Panel: Recent Labs    06/10/23 0419  VITAMINB12 601  FOLATE 8.9   Sepsis Labs: No results for input(s): "PROCALCITON", "LATICACIDVEN" in the last 168 hours.  Recent Results (from the past 240 hours)  Resp panel by RT-PCR (RSV, Flu A&B, Covid) Anterior Nasal Swab     Status: None   Collection Time: 06/09/23  8:47 PM   Specimen:  Anterior Nasal Swab  Result Value Ref Range Status   SARS Coronavirus 2 by RT PCR NEGATIVE NEGATIVE Final    Comment: (NOTE) SARS-CoV-2 target nucleic acids are NOT DETECTED.  The SARS-CoV-2 RNA is generally detectable in upper respiratory specimens during the acute phase of infection. The lowest concentration of SARS-CoV-2 viral copies this assay can detect is 138 copies/mL. A negative result does not preclude SARS-Cov-2 infection and should not be used as the sole basis for treatment or other patient management decisions. A negative result may occur with  improper specimen collection/handling, submission of specimen other than nasopharyngeal swab, presence of viral mutation(s) within the areas targeted by this assay, and inadequate number of viral copies(<138 copies/mL). A negative result must be combined with clinical observations,  patient history, and epidemiological information. The expected result is Negative.  Fact Sheet for Patients:  BloggerCourse.com  Fact Sheet for Healthcare Providers:  SeriousBroker.it  This test is no t yet approved or cleared by the [place] FDA and  has been authorized for detection and/or diagnosis of SARS-CoV-2 by FDA under an Emergency Use Authorization (EUA). This EUA will remain  in effect (meaning this test can be used) for the duration of the COVID-19 declaration under Section 564(b)(1) of the Act, 21 U.S.C.section 360bbb-3(b)(1), unless the authorization is terminated  or revoked sooner.       Influenza A by PCR NEGATIVE NEGATIVE Final   Influenza B by PCR NEGATIVE NEGATIVE Final    Comment: (NOTE) The Xpert Xpress SARS-CoV-2/FLU/RSV plus assay is intended as an aid in the diagnosis of influenza from Nasopharyngeal swab specimens and should not be used as a sole basis for treatment. Nasal washings and aspirates are unacceptable for Xpert Xpress SARS-CoV-2/FLU/RSV testing.  Fact Sheet for Patients: BloggerCourse.com  Fact Sheet for Healthcare Providers: SeriousBroker.it  This test is not yet approved or cleared by the [place] FDA and has been authorized for detection and/or diagnosis of SARS-CoV-2 by FDA under an Emergency Use Authorization (EUA). This EUA will remain in effect (meaning this test can be used) for the duration of the COVID-19 declaration under Section 564(b)(1) of the Act, 21 U.S.C. section 360bbb-3(b)(1), unless the authorization is terminated or revoked.     Resp Syncytial Virus by PCR NEGATIVE NEGATIVE Final    Comment: (NOTE) Fact Sheet for Patients: BloggerCourse.com  Fact Sheet for Healthcare Providers: SeriousBroker.it  This test is not yet approved or cleared by the [place] FDA and has been authorized for detection and/or diagnosis of SARS-CoV-2 by FDA under an Emergency Use Authorization (EUA). This EUA will remain in effect (meaning this test can be used) for the duration of the COVID-19 declaration under Section 564(b)(1) of the Act, 21 U.S.C. section 360bbb-3(b)(1), unless the authorization is terminated or revoked.  Performed at [facility], [address] [place], [place] [address]   MRSA Next Gen by PCR, Nasal     Status: Abnormal   Collection Time: 06/10/23  2:30 AM   Specimen: Nasal Mucosa; Nasal Swab  Result Value Ref Range Status   MRSA by PCR Next Gen DETECTED (A) NOT DETECTED Final    Comment: (NOTE) The GeneXpert MRSA Assay (FDA approved for NASAL specimens only), is one component of a comprehensive MRSA colonization surveillance program. It is not intended to diagnose MRSA infection nor to guide or monitor treatment for MRSA infections. Test performance is not FDA approved in patients less than 53 years old. Performed at [facility], [address] [place], [place] [address]          Radiology Studies: CT ABDOMEN PELVIS WO CONTRAST Result Date: 06/09/2023 CLINICAL DATA:  Nausea and vomiting. EXAM: CT ABDOMEN AND PELVIS WITHOUT CONTRAST TECHNIQUE: Multidetector CT imaging of the abdomen and pelvis was performed following the standard protocol without IV contrast. RADIATION DOSE REDUCTION: This exam was performed according to the departmental dose-optimization program which includes automated exposure control, adjustment of the mA and/or kV according to patient size and/or use of iterative reconstruction technique. COMPARISON:  April 28, 2023 FINDINGS: Lower chest: A 4 mm noncalcified lung nodule is seen within the anterolateral aspect of the right lower lobe (axial CT image 2, CT series 5). This is not clearly identified on the prior study. Hepatobiliary: No focal liver abnormality is  seen. Status post cholecystectomy. No biliary dilatation. Pancreas: Unremarkable. No pancreatic ductal dilatation or surrounding inflammatory changes. Spleen: Normal in size without focal abnormality. Adrenals/Urinary Tract: The right adrenal gland is unremarkable. A stable 19 mm diameter low-attenuation (approximately 17.58 Hounsfield units) left adrenal mass is noted. The left kidney is atrophic in appearance with diffuse renal cortical thinning noted. No focal renal lesions are identified. There is a percutaneous right-sided nephrostomy tube in place. A right-sided endo ureteral stent is also seen. There is moderate to marked severity right-sided hydronephrosis and proximal hydroureter. Mild to moderate severity left-sided hydronephrosis and proximal hydroureter is also seen with markedly dilated intrarenal calices on the left. A moderate amount of air is seen within the lumen of a markedly distended urinary bladder. Stomach/Bowel: Stomach is within normal limits. The appendix is not identified. Surgically anastomosed bowel is seen within the anterior aspect of the right lower quadrant. A right lower quadrant ostomy site is also seen. No evidence of bowel dilatation. Stable mild to moderate severity circumferential thickening of the distal sigmoid colon is noted. Vascular/Lymphatic: Aortic atherosclerosis. No enlarged abdominal or pelvic lymph nodes. Reproductive: Status post hysterectomy. No adnexal masses. Stable postoperative changes are seen within the posterior aspect of the pelvis. Other: There is a stable appearing right lower quadrant ostomy site. No abdominopelvic ascites. Musculoskeletal: There is stable, chronic presacral soft tissue thickening. Multilevel degenerative changes are noted throughout the lumbar spine. IMPRESSION: 1. Moderate to marked severity right-sided hydronephrosis and proximal hydroureter with a percutaneous right-sided nephrostomy tube and right-sided endo ureteral stent in place.  2. Mild to moderate severity left-sided hydronephrosis and proximal hydroureter with markedly dilated intrarenal calices on the left. 3. Stable postoperative changes within the posterior aspect of the pelvis. 4. Stable left adrenal adenoma. No follow-up imaging is recommended. 5. 4 mm noncalcified right lower lobe lung nodule. Correlation with follow-up nonemergent dedicated chest CT is recommended. 6. Aortic atherosclerosis. Aortic Atherosclerosis (ICD10-I70.0). Electronically Signed   By: Aram Candela M.D.   On: 06/09/2023 22:40   DG Abdomen Acute W/Chest Result Date: 06/09/2023 CLINICAL DATA:  Nausea and vomiting. EXAM: DG ABDOMEN ACUTE WITH 1 VIEW CHEST COMPARISON:  April 28, 2023 FINDINGS: The right-sided venous catheter seen on the prior study has been removed and has been replaced with a left-sided venous catheter. Its distal tip is seen at the junction of the superior vena cava and right atrium. There is no evidence of dilated bowel loops or free intraperitoneal air. A percutaneous nephrostomy tube is suspected on the right. A right-sided endo ureteral stent is also seen. No radiopaque calculi or other significant radiographic abnormality is seen. Heart size and mediastinal contours are within normal limits. Mild to  moderate severity calcification of the aortic arch is seen. Both lungs are clear. IMPRESSION: 1. No acute cardiopulmonary disease. 2. Nonobstructive bowel gas pattern. 3. Right-sided percutaneous nephrostomy tube and right-sided endo ureteral stent. Electronically Signed   By: Aram Candela M.D.   On: 06/09/2023 22:25        Scheduled Meds:  acetaminophen  1,000 mg Oral Once   Chlorhexidine Gluconate Cloth  6 each Topical Q0600   divalproex  125 mg Oral QHS   enoxaparin (LOVENOX) injection  30 mg Subcutaneous Q24H   feeding supplement  237 mL Oral BID BM   levothyroxine  25 mcg Oral Q0600   mirtazapine  7.5 mg Oral QHS   mupirocin ointment  1 Application Nasal BID    pantoprazole  40 mg Oral Daily   sertraline  25 mg Oral Daily   sodium chloride flush  10-40 mL Intracatheter Q12H   Continuous Infusions:  sodium chloride 75 mL/hr at 06/10/23 0200          Glade Lloyd, MD [facility] 06/10/2023, 11:42 AM

## 2023-06-10 NOTE — Progress Notes (Signed)
Initial Nutrition Assessment  INTERVENTION:   -TPN management per Pharmacy -Recommend 100 mg Thiamine daily given continued N/V  -Continue Ensure Plus High Protein po BID, each supplement provides 350 kcal and 20 grams of protein as tolerated  NUTRITION DIAGNOSIS:   Inadequate oral intake related to nausea, vomiting, altered GI function as evidenced by per patient/family report.  GOAL:   Patient will meet greater than or equal to 90% of their needs  MONITOR:   PO intake, Supplement acceptance, Labs, Weight trends, I & O's, Skin (TPN)  REASON FOR ASSESSMENT:   Consult New TPN/TNA  ASSESSMENT:   67 y.o. female with medical history significant of cervical cancer and rectovaginal fistula s/p redo low anterior resection with colovaginal fistula takedown, fistula repair, and hand sewn end to end sigmoidrectal anastomosis with loop ileostomy on 11/23/18; closure of loop ileostomy 05/01/20, SBO s/p ex-lap with lysis of adhesions on 11/08/20, recurrent SBO s/p ex-lap, LOA, distal ileum and cecum resection with end ileostomy creation on 03/14/2023 started on TPN, right hydronephrosis s/p R ureteral stent placement on 04/02/2023 with subsequent obstructive uropathy, UTI, AKI and bilateral hydronephrosis with placement of right nephrostomy tube on 05/08/2023 who presents with nausea and vomiting and hyponatremia.  Unable to gather history from patient, alert/oriented x 1. Will attempt to gather history at later date and in person.  Patient on home TPN. Has had extended admissions at [facility], was started on TPN there in September 2024 given extensive abdominal surgeries and concern for malabsorption and chronic N/V. Pt with ileostomy and nephrostomy. History of severe malnutrition, suspect this continues.   Pt takes in some PO but not reliable nutrition source as pt has chronic nausea and vomiting.  Currently on regular diet and Ensure has been ordered.  Per weight records, pt has lost 16 lbs  since 7/22 (12% wt loss x 5 months, significant for time frame).  Medications: Remeron   Labs reviewed: Low Na Elevated Mg   NUTRITION - FOCUSED PHYSICAL EXAM:  Unable to complete, working remotely.  Diet Order:   Diet Order             Diet regular Room service appropriate? Yes; Fluid consistency: Thin  Diet effective now                   EDUCATION NEEDS:   No education needs have been identified at this time  Skin:  Skin Assessment: Skin Integrity Issues: Skin Integrity Issues:: Stage II Stage II: mid sacrum  Last BM:  12/24 -ileostomy  Height:   Ht Readings from Last 1 Encounters:  06/10/23 5\' 2"  (1.575 m)    Weight:   Wt Readings from Last 1 Encounters:  06/10/23 49.2 kg    BMI:  Body mass index is 19.84 kg/m.  Estimated Nutritional Needs:   Kcal:  1500-1700  Protein:  75-90g  Fluid:  1.7L/day  Tilda Franco, MS, RD, LDN Inpatient Clinical Dietitian Contact via Secure chat

## 2023-06-10 NOTE — Plan of Care (Signed)

## 2023-06-10 NOTE — Plan of Care (Signed)
  Problem: Nutrition: Goal: Adequate nutrition will be maintained Outcome: Progressing   Problem: Pain Management: Goal: General experience of comfort will improve Outcome: Progressing

## 2023-06-10 NOTE — Assessment & Plan Note (Addendum)
-  Pt has extension abdominal surgery due to hx of cervical cancer and currently on TPN -keep on continuous TPN-pharmacy consult placed -pt may benefit from palliative care to discuss goals of care as she is understandably frustrated with repeat hospitalization.

## 2023-06-10 NOTE — Assessment & Plan Note (Signed)
-  this has been an ongoing chronic issue for her due to her complex abdominal surgical hx. No SBO demonstrated on CT imaging today.  -PRN antiemetics

## 2023-06-10 NOTE — Assessment & Plan Note (Signed)
-  secondary to decrease intake and likely GI loss -sodium improved following NS bolus. Will continue IV NS gentle hydration with goal Na of < in 24 hours.

## 2023-06-11 DIAGNOSIS — E871 Hypo-osmolality and hyponatremia: Secondary | ICD-10-CM | POA: Diagnosis not present

## 2023-06-11 LAB — COMPREHENSIVE METABOLIC PANEL
ALT: 105 U/L — ABNORMAL HIGH (ref 0–44)
AST: 50 U/L — ABNORMAL HIGH (ref 15–41)
Albumin: 3 g/dL — ABNORMAL LOW (ref 3.5–5.0)
Alkaline Phosphatase: 245 U/L — ABNORMAL HIGH (ref 38–126)
Anion gap: 5 (ref 5–15)
BUN: 87 mg/dL — ABNORMAL HIGH (ref 8–23)
CO2: 20 mmol/L — ABNORMAL LOW (ref 22–32)
Calcium: 9.1 mg/dL (ref 8.9–10.3)
Chloride: 102 mmol/L (ref 98–111)
Creatinine, Ser: 1.26 mg/dL — ABNORMAL HIGH (ref 0.44–1.00)
GFR, Estimated: 47 mL/min — ABNORMAL LOW (ref >60)
Glucose, Bld: 132 mg/dL — ABNORMAL HIGH (ref 70–99)
Potassium: 4.6 mmol/L (ref 3.5–5.1)
Sodium: 127 mmol/L — ABNORMAL LOW (ref 135–145)
Total Bilirubin: 0.4 mg/dL (ref <1.2)
Total Protein: 6.5 g/dL (ref 6.5–8.1)

## 2023-06-11 LAB — GLUCOSE, CAPILLARY
Glucose-Capillary: 123 mg/dL — ABNORMAL HIGH (ref 70–99)
Glucose-Capillary: 148 mg/dL — ABNORMAL HIGH (ref 70–99)
Glucose-Capillary: 150 mg/dL — ABNORMAL HIGH (ref 70–99)
Glucose-Capillary: 163 mg/dL — ABNORMAL HIGH (ref 70–99)
Glucose-Capillary: 194 mg/dL — ABNORMAL HIGH (ref 70–99)

## 2023-06-11 LAB — CBC WITH DIFFERENTIAL/PLATELET
Abs Immature Granulocytes: 0.04 10*3/uL (ref 0.00–0.07)
Basophils Absolute: 0 10*3/uL (ref 0.0–0.1)
Basophils Relative: 1 %
Eosinophils Absolute: 0.1 10*3/uL (ref 0.0–0.5)
Eosinophils Relative: 1 %
HCT: 31.6 % — ABNORMAL LOW (ref 36.0–46.0)
Hemoglobin: 10.7 g/dL — ABNORMAL LOW (ref 12.0–15.0)
Immature Granulocytes: 1 %
Lymphocytes Relative: 14 %
Lymphs Abs: 0.9 10*3/uL (ref 0.7–4.0)
MCH: 30.1 pg (ref 26.0–34.0)
MCHC: 33.9 g/dL (ref 30.0–36.0)
MCV: 88.8 fL (ref 80.0–100.0)
Monocytes Absolute: 0.6 10*3/uL (ref 0.1–1.0)
Monocytes Relative: 8 %
Neutro Abs: 4.9 10*3/uL (ref 1.7–7.7)
Neutrophils Relative %: 75 %
Platelets: 195 10*3/uL (ref 150–400)
RBC: 3.56 MIL/uL — ABNORMAL LOW (ref 3.87–5.11)
RDW: 15.4 % (ref 11.5–15.5)
WBC: 6.5 10*3/uL (ref 4.0–10.5)
nRBC: 0 % (ref 0.0–0.2)

## 2023-06-11 LAB — TRIGLYCERIDES: Triglycerides: 191 mg/dL — ABNORMAL HIGH (ref <150)

## 2023-06-11 LAB — PHOSPHORUS: Phosphorus: 2.7 mg/dL (ref 2.5–4.6)

## 2023-06-11 LAB — MAGNESIUM: Magnesium: 2.3 mg/dL (ref 1.7–2.4)

## 2023-06-11 MED ORDER — ENOXAPARIN SODIUM 40 MG/0.4ML IJ SOSY
40.0000 mg | PREFILLED_SYRINGE | INTRAMUSCULAR | Status: DC
  Administered 2023-06-11 – 2023-06-24 (×14): 40 mg via SUBCUTANEOUS
  Filled 2023-06-11 (×14): qty 0.4

## 2023-06-11 MED ORDER — SODIUM CHLORIDE 0.9 % IV SOLN: INTRAVENOUS | Status: DC

## 2023-06-11 MED ORDER — FAT EMUL FISH OIL/PLANT BASED 20% (SMOFLIPID)IV EMUL
250.0000 mL | INTRAVENOUS | Status: AC
  Administered 2023-06-11: 250 mL via INTRAVENOUS
  Filled 2023-06-11: qty 250

## 2023-06-11 MED ORDER — SODIUM CHLORIDE 0.9 % IV SOLN
1.0000 g | INTRAVENOUS | Status: AC
  Administered 2023-06-11 – 2023-06-15 (×5): 1 g via INTRAVENOUS
  Filled 2023-06-11 (×5): qty 10

## 2023-06-11 MED ORDER — ACETAMINOPHEN 325 MG PO TABS
650.0000 mg | ORAL_TABLET | Freq: Four times a day (QID) | ORAL | Status: DC | PRN
  Administered 2023-06-11 – 2023-06-24 (×22): 650 mg via ORAL
  Filled 2023-06-11 (×23): qty 2

## 2023-06-11 MED ORDER — TRACE MINERALS CU-MN-SE-ZN 300-55-60-3000 MCG/ML IV SOLN
INTRAVENOUS | Status: AC
  Filled 2023-06-11 (×2): qty 1000

## 2023-06-11 NOTE — Progress Notes (Signed)
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   Assessment:  67 y/o F with PMH of cervical cancer and rectovaginal fistula s/p redo low anterior resection with colovaginal fistula takedown, fistula repair, and hand sewn end to end sigmoidrectal anastomosis with loop ileostomy on 11/23/18; closure of loop ileostomy 05/01/20, SBO s/p ex-lap with lysis of adhesions on 11/08/20, recurrent SBO s/p ex-lap, LOA, distal ileum and cecum resection with end ileostomy creation on 03/14/2023 started on TPN, right hydronephrosis s/p R ureteral stent placement on 04/02/2023 with subsequent obstructive uropathy, UTI, AKI and bilateral hydronephrosis with placement of right nephrostomy tube on 05/08/2023 who presents with nausea/vomiting and hyponatremia. Pharmacy consulted to resume TPN on admission.   Glucose / Insulin:  No history of DM.  - on sSSI (used 4 units in the past 24 hrs) - CBGs (goal <150): 132-196 Electrolytes: Na low at 127, CO2 slightly low at 20 but trending up  - Phos, Mag, K , Cl, CorrCa wnl Renal: AKI on CKD stage IIIa - SCr trending down with 1.26 (crcl~34). BUN elevated but improved to 87.  Hepatic: AST/ALT and  Alk Phos elevated but trending down. Tbili WNL.  - TG slightly elevated at 191 - Albumin low 3 Intake / Output; MIVF: I/O + 694 mL. MIVF of NS at 75 mL/hr - UOP: 0.6 ml/kg/hr GI Imaging:  - 12/23 CT abdomen and pelvis: did not show SBO but showed moderate to marked severity right-sided hydronephrosis and proximal hydroureter with percutaneous right-sided nephrostomy tube and right sided intraureteral stent in place along with mild to moderate severity left-sided hydronephrosis and proximal hydroureter with markedly  dilated intrarenal calyces on the left.  GI Surgeries / Procedures: see above  Central access: PICC line in place TPN start date: continued from PTA  Nutritional Goals: Due to the Baxter IV fluid disruption, all adult parenteral nutrition will utilize premade Clinimix products +/- fat emulsion infusion. Our goal will be to continue providing as close to 100% of our patient's nutritional needs, but due to limited TPN Clinimix concentrations we will meet at least 80% of protein and 75% of kcal goals.  Per discussion with [employer] and [employer], patient was receiving Clinimix E 5/15 1000 mL over 24 hours and Intralipid 20% 250 mL over 12 hours while at facility   -Goal TPN rate (Clinimix 8/10) is 42 mL/hr (provides 80g of protein and 1164 kcals per day) - meets 100% of protein goal and 78% of kcal goals with TPN alone  -Also, each Ensure Plus supplement (ordered BID) provides 350 kcal and 20g of protein     RD Assessment: Estimated Needs Total Energy Estimated Needs: 1500-1700 Total Protein Estimated Needs: 75-90g Total Fluid Estimated Needs: 1.7L/day  Current Nutrition:  Regular diet Ensure Plus BID TPN to resume this PM  Plan:  - Continue Clinimix E 8/10 at 55mL/hr at 1800 - Electrolytes in TPN (fixed; unable to adjust in premixed Clinimix): Na 77mEq/L, K 47mEq/L, Ca 67mEq/L, Mg 73mEq/L, Phos 2mmol/L, Acetate 83 mEq/L, Cl 76 mEq/L - Add standard MVI and trace elements to TPN - SMOFlipid 20% IV over 12 hours. Will monitor TG closely and may hold if TG increases significantly  - Continue  Sensitive q6h SSI and  adjust as needed  - Thiamine 100mg  PO daily per RD recommendations - MIVF management per MD - Monitor TPN labs on Mon/Thurs - CMET, Mg, Phos, Triglycerides on 12/26   Dorna Leitz, PharmD, BCPS 06/11/2023 7:26 AM

## 2023-06-11 NOTE — Consult Note (Signed)
Consultation Note Date: 06/11/2023   Patient Name: [name]  DOB: 07-Sep-1955  MRN: [id number]  Age / Sex: 67 y.o., female  PCP: Patient, [name] Referring Physician: Glade Lloyd, MD  Reason for Consultation: Establishing goals of care  HPI/Patient Profile: 67 y.o. female  admitted on 06/09/2023  67 y.o. female with medical history significant of cervical cancer and rectovaginal fistula s/p redo low anterior resection with colovaginal fistula takedown, fistula repair, and hand sewn end to end sigmoidrectal anastomosis with loop ileostomy on 11/23/18; closure of loop ileostomy 05/01/20, SBO s/p ex-lap with lysis of adhesions on 11/08/20, recurrent SBO s/p ex-lap, LOA, distal ileum and cecum resection with end ileostomy creation on 03/14/2023 started on TPN, right hydronephrosis s/p R ureteral stent placement on 04/02/2023 with subsequent obstructive uropathy, UTI, AKI and bilateral hydronephrosis with placement of right nephrostomy tube on 05/08/2023 presented with nausea, vomiting and hyponatremia from SNF.  Patient has been on TPN recently after her recent hospitalization at [facility] and discharged to SNF.   Clinical Assessment and Goals of Care: Patient remains admitted to hospital service for hyponatremia, acute kidney injury, acute metabolic acidosis, possible UTI. Out of consult for CODE STATUS and goals of care discussions has been requested. Chart reviewed, patient seen, discussed with husband present at bedside. Palliative medicine is specialized medical care for people living with serious illness. It focuses on providing relief from the symptoms and stress of a serious illness. The goal is to improve quality of life for both the patient and the family. Goals of care: Broad aims of medical therapy in relation to the patient's values and preferences. Our aim is to provide  medical care aimed at enabling patients to achieve the goals that matter most to them, given the circumstances of their particular medical situation and their constraints.   Goals of care discussions undertaken.  Patient and husband want to go to a different skilled nursing facility towards the end of this hospitalization.  They wish to establish care locally and ask for urology and general surgery follow-up while the patient is hospitalized.  Patient to remain on TPN.  Patient appears angry and does not wish to discuss much in the way of goals of care.  NEXT OF KIN Husband who is present at bedside.  SUMMARY OF RECOMMENDATIONS   Full code full scope Continue TPN Patient wants to go to a different SNF rehab-[facility]s [facility].  Recommend addition of palliative services in the outpatient setting Continue current mode of care.   Code Status/Advance Care Planning: Full code   Symptom Management:     Palliative Prophylaxis:  Delirium Protocol  Additional Recommendations (Limitations, Scope, Preferences): Full Scope Treatment  Psycho-social/Spiritual:  Desire for further Chaplaincy support:yes Additional Recommendations: Caregiving  Support/Resources  Prognosis:  Unable to determine  Discharge Planning: Skilled Nursing Facility for rehab with Palliative care service follow-up      Primary Diagnoses: Present on Admission:  Hypothyroidism  Mixed hyperlipidemia   I have reviewed the medical record,  interviewed the patient and family, and examined the patient. The following aspects are pertinent.  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  Tobacco Use   Smoking status: Former    Current packs/day: 0.15    Types: Cigarettes   Smokeless tobacco: Never  Vaping Use   Vaping status: Never Used  Substance and Sexual Activity   Alcohol use: Not Currently   Drug use: No   Sexual activity: Not Currently  Other Topics Concern   Not on file  Social History Narrative   Patient is a retired Conservation officer, 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   Patient is married and has 1 daughter and 1 granddaughter who reside about 4 miles from her   Social Drivers of Corporate investment banker Strain: Low Risk  (11/06/2022)   Overall Financial Resource Strain (CARDIA)    Difficulty of Paying Living Expenses: Not hard at all  Food Insecurity: No Food Insecurity (06/10/2023)   Hunger Vital Sign    Worried About Running Out of Food in the Last Year: Never true    Ran Out of Food in the Last Year: Never true  Transportation Needs: No Transportation Needs (06/10/2023)   PRAPARE - Administrator, Civil Service (Medical): No    Lack of Transportation (Non-Medical): No  Physical Activity: Not on file  Stress: No Stress Concern Present (11/06/2022)   [employer] of Occupational Health - Occupational Stress Questionnaire    Feeling of Stress : Only a little  Social Connections: Not on file   Family History  Problem Relation Age of Onset   Heart disease Father    Diabetes Father    Parkinson's disease Neg Hx    Scheduled Meds:  acetaminophen  1,000 mg Oral Once   Chlorhexidine Gluconate Cloth  6 each Topical Q0600   divalproex  125 mg Oral QHS   enoxaparin (LOVENOX) injection  40 mg Subcutaneous Q24H   feeding supplement  237 mL Oral BID BM   insulin aspart  0-9 Units Subcutaneous Q6H   levothyroxine  25 mcg Oral Q0600   mirtazapine  7.5 mg Oral QHS   mupirocin ointment  1 Application  Nasal BID   pantoprazole  40 mg Oral Daily   sertraline  25 mg Oral Daily   sodium chloride flush  10-40 mL Intracatheter Q12H   thiamine  100 mg Oral Daily   Continuous Infusions:  sodium chloride     cefTRIAXone (ROCEPHIN)  IV     TPN (CLINIMIX-E) Adult     And   fat emul(SMOFlipid)     TPN (CLINIMIX-E) Adult 42 mL/hr at 06/10/23 1718   PRN Meds:.lip balm, ondansetron (ZOFRAN) IV, mouth rinse Medications Prior to Admission:  Prior to Admission medications   Medication Sig Start Date End Date Taking? Authorizing Provider  acetaminophen (TYLENOL) 500 MG tablet Take 1,000 mg by mouth every 6 (six)  hours as needed.   Yes [provider]  Amino Acid Infusion in D15W (CLINIMIX/DEXTROSE, 5/15, IV) Inject 42 mLs into the vein See admin instructions. 42 ml per hour, intravenously every 24 hours   Yes [provider]  cetirizine (ZYRTEC) 10 MG tablet Take 10 mg by mouth daily. PRN   Yes [provider]  divalproex (DEPAKOTE) 125 MG DR tablet Take 125 mg by mouth at bedtime.   Yes [provider]  dronabinol (MARINOL) 2.5 MG capsule Take 2.5 mg by mouth 2 (two) times daily.   Yes [provider]  Fat Emulsion Plant Based (FAT EMULSION 20 %, TPN/TNA, FOR ANES DOCUMENTATION) 20 % EMUL Inject 21 mLs into the vein continuous. 21 ml per hour every 24 hour (250 ml over 12 hours), once lipid bag is completed, disconnect lipid line, flush picc line with 5-10 ml saline flush.   Yes [provider]  fluticasone (FLONASE) 50 MCG/ACT nasal spray Place 1 spray into both nostrils daily. 08/30/22  Yes [provider]  hydrOXYzine (ATARAX) 25 MG tablet Take 25 mg by mouth every 8 (eight) hours as needed for anxiety.   Yes [provider]  Lactobacillus Rhamnosus, GG, (CULTURELLE HEALTH & WELLNESS) CAPS Take 1 capsule by mouth daily. 05/21/23  Yes [provider]  levothyroxine (SYNTHROID) 25 MCG tablet Take 1 tablet (25 mcg total) by  mouth daily. 07/17/22  Yes Gottschalk, Kathie Rhodes M, DO  meloxicam (MOBIC) 7.5 MG tablet Take 7.5 mg by mouth daily. For 7 days   Yes [provider]  mirtazapine (REMERON) 7.5 MG tablet Take 7.5 mg by mouth at bedtime.   Yes [provider]  Multiple Vitamins-Minerals (MULTIVITAMIN) tablet Take 1 tablet by mouth daily.   Yes [provider]  Multiple Vitamins-Minerals (ONCOVITE) TABS Take 1 tablet by mouth daily. 05/21/23  Yes [provider]  mupirocin ointment (BACTROBAN) 2 % 1 Application 2 (two) times daily. To red area around insertion site for 7 days   Yes [provider]  omeprazole (PRILOSEC) 20 MG capsule Take 20 mg by mouth in the morning. 05/21/23  Yes [provider]  ondansetron (ZOFRAN) 4 MG tablet Take 4 mg by mouth every 6 (six) hours as needed for nausea or vomiting.   Yes [provider]  ondansetron (ZOFRAN) 4 MG/2ML SOLN injection Inject 4 mg into the vein once as needed for nausea.   Yes [provider]  Ethelda Chick Calcium 500 MG TABS Take 1,250 mg by mouth daily. 05/21/23  Yes [provider]  polyethylene glycol powder (GLYCOLAX/MIRALAX) 17 GM/SCOOP powder Take 1 Container by mouth once. Give 1 scoop by mouth every 24 hours as needed for constipation   Yes [provider]  promethazine (PHENERGAN) 12.5 MG tablet Take 12.5 mg by mouth every 6 (six) hours as needed for nausea or vomiting.   Yes [provider]  sertraline (ZOLOFT) 25 MG tablet Take 25 mg by mouth daily.   Yes [provider]  simvastatin (ZOCOR) 20 MG tablet Take 1 tablet (20 mg total) by mouth daily at 6 PM. 12/06/22  Yes Gottschalk, Ashly M, DO  sodium chloride 1 g tablet Take 1 g by mouth daily. For 3 days   Yes [provider]  ciprofloxacin (CIPRO) 500 MG tablet Take 500 mg by mouth 2 (two) times daily. Patient not taking: Reported on 06/10/2023 05/21/23   [provider]   Allergies   Allergen Reactions   Barium-Containing Compounds  hives   Percocet [Oxycodone-Acetaminophen] Itching and Other (See Comments)    migraine    Review of Systems Angry  Physical Exam Awake alert sitting up in a chair Appears angry Has nephrostomy tube Not in any acute distress Does not participate in conversation much  Vital Signs: BP 93/71 (BP Location: Left Arm)   Pulse (!) 105   Temp 97.7 F (36.5 C) (Oral)   Resp 14   Ht 5\' 2"  (1.575 m)   Wt 50.1 kg   SpO2 100%   BMI 20.20 kg/m  Pain Scale: 0-10   Pain Score: 0-No pain   SpO2: SpO2: 100 % O2 Device:SpO2: 100 % O2 Flow Rate: .   IO: Intake/output summary:  Intake/Output Summary (Last 24 hours) at 06/11/2023 1240 Last data filed at 06/11/2023 0350 Gross per 24 hour  Intake 1984.15 ml  Output 1650 ml  Net 334.15 ml    LBM: Last BM Date : 06/10/23 Baseline Weight: Weight: 49.2 kg Most recent weight: Weight: 50.1 kg     Palliative Assessment/Data:   PPS 50%  Time In:  11.30 Time Out:  12.30 Time Total:  60  Greater than 50%  of this time was spent counseling and coordinating care related to the above assessment and plan.  Signed by: Rosalin Hawking, MD   Please contact Palliative Medicine Team phone at [phone] for questions and concerns.  For individual provider: See Loretha Stapler

## 2023-06-11 NOTE — Progress Notes (Signed)
PROGRESS NOTE    [name]  ZOX:[id number] DOB: 12/19/1955 DOA: 06/09/2023 PCP: Patient, [name]   Brief Narrative:  67 y.o. female with medical history significant of cervical cancer and rectovaginal fistula s/p redo low anterior resection with colovaginal fistula takedown, fistula repair, and hand sewn end to end sigmoidrectal anastomosis with loop ileostomy on 11/23/18; closure of loop ileostomy 05/01/20, SBO s/p ex-lap with lysis of adhesions on 11/08/20, recurrent SBO s/p ex-lap, LOA, distal ileum and cecum resection with end ileostomy creation on 03/14/2023 started on TPN, right hydronephrosis s/p R ureteral stent placement on 04/02/2023 with subsequent obstructive uropathy, UTI, AKI and bilateral hydronephrosis with placement of right nephrostomy tube on 05/08/2023 presented with nausea, vomiting and hyponatremia from SNF.  Patient has been on TPN recently after her recent hospitalization at [facility] and discharged to SNF.  On presentation, she was tachypneic with sodium of 121 (last sodium on 05/24/2023 in care everywhere was 130).  Creatinine was 2.07 (last creatinine was 1.09 on 05/24/2023) with bicarb of 18.  CT of abdomen and pelvis did not show SBO but showed moderate to marked severity right-sided hydronephrosis and proximal hydroureter with percutaneous right-sided nephrostomy tube and right sided intraureteral stent in place along with mild to moderate severity left-sided hydronephrosis and proximal hydroureter with markedly dilated intrarenal calyces on the left.  She was started on IV fluids.  Assessment & Plan:   Hyponatremia -Possibly due to decreased oral intake and likely GI loss. -Sodium 121 on presentation.  Currently on gentle hydration.  Sodium 127 this morning.  Resume normal saline at 75 cc an hour.  DC sertraline.  Acute kidney injury on chronic kidney disease stage IIIa Acute metabolic acidosis Possible UTI: Present on admission History of  bilateral hydronephrosis status post ureteral stent and right nephrostomy tube -Presented with creatinine of 2.07, last creatinine was 1.09 on 05/24/2023.  Creatinine 1.26 this morning.  Monitor.  Bicarb improving.  Monitor.  Continue gentle hydration as above. -Imaging as above.  Discussed with Dr. Elenore Paddy on phone on 06/10/2023: He reviewed prior records, notes and imaging and stated that patient has an almost atrophic/nonfunctional left kidney with possible chronic hydronephrosis and patient would not need any procedure for left hydronephrosis.  As long as right nephrostomy tube is draining and kidney function is improving, patient will need to follow-up with urology as an outpatient. -UA suggestive of possible UTI.  Start Rocephin.  Follow cultures.  Hypothyroidism -Continue levothyroxine  Hyperlipidemia -Hold statin due to transaminitis  Transaminitis -LFTs mildly elevated.  CT imaging showed no biliary dilatation.  LFTs improving.  Monitor intermittently.  Nausea and vomiting -Has been an ongoing chronic issue for her due to her complex abdominal surgical history.  No small bowel obstruction demonstrated on CT imaging on presentation.  Continue as needed antiemetics.  Outpatient follow-up with primary surgical team  Failure to thrive in adult --Pt has extension abdominal surgery due to hx of cervical cancer and currently on TPN  -Palliative care consultation for goals of care discussion.  History of cervical cancer and rectovaginal fistula status post surgical intervention -Currently has an ileostomy.  Outpatient follow-up with oncology/GYN oncology/general surgery  Anxiety/depression -Continue sertraline and mirtazapine  Physical deconditioning -Patient from SNF.  TOC consulted.  PT/OT eval.  DVT prophylaxis: Lovenox Code Status: Full Family Communication: Husband and family members at bedside Disposition Plan: Status is: inpatient because: Of severity of  illness  Consultants: Discussed with urology/Dr. Laverle Patter on phone on 12  10/09/2022  Procedures: None  Antimicrobials: None   Subjective: Patient seen and examined at bedside.  Does not engage in conversation much.  Intermittently gets angry.  No fever, vomiting, agitation or seizures reported. Objective: Vitals:   06/10/23 2016 06/11/23 0428 06/11/23 0449 06/11/23 0457  BP: 103/76 93/71    Pulse: 91 (!) 105    Resp: 19 19 14    Temp: (!) 97.4 F (36.3 C) 97.7 F (36.5 C)    TempSrc: Oral Oral    SpO2: 99% 100%    Weight:    50.1 kg  Height:        Intake/Output Summary (Last 24 hours) at 06/11/2023 1105 Last data filed at 06/11/2023 0350 Gross per 24 hour  Intake 1984.15 ml  Output 1650 ml  Net 334.15 ml   Filed Weights   06/10/23 0200 06/11/23 0457  Weight: 49.2 kg 50.1 kg    Examination:  General: On room air.  No distress.  Chronically ill and deconditioned looking. ENT/neck: No thyromegaly.  JVD is not elevated  respiratory: Decreased breath sounds at bases bilaterally with some crackles; no wheezing  CVS: S1-S2 heard, rate controlled Abdominal: Soft, mildly tender, slightly distended; no organomegaly, normal bowel sounds are heard.  Ileostomy bag present with liquid stool. Genitourinary: Right nephrostomy tube present Extremities: Trace lower extremity edema; no cyanosis  CNS: Awake and alert.  Slow to respond.  Poor historian.  No focal neurologic deficit.  Moves extremities Lymph: No obvious lymphadenopathy Skin: No obvious ecchymosis/lesions  psych: No signs of agitation.  Affect is extremely flat.  Still does not want to participate in conversation much. musculoskeletal: No obvious joint swelling/deformity    Data Reviewed: I have personally reviewed following labs and imaging studies  CBC: Recent Labs  Lab 06/09/23 2031 06/10/23 0419 06/11/23 0340  WBC 9.1 7.3 6.5  NEUTROABS 8.3*  --  4.9  HGB 12.9 12.2 10.7*  HCT 36.0 36.1 31.6*  MCV 82.9  87.8 88.8  PLT 284 217 195   Basic Metabolic Panel: Recent Labs  Lab 06/09/23 2031 06/09/23 2220 06/10/23 0418 06/10/23 0419 06/10/23 1524 06/11/23 0340  NA 121* 123*  --  128* 130* 127*  K 4.8  --   --  4.5 4.5 4.6  CL 88*  --   --  97* 101 102  CO2 18*  --   --  19* 19* 20*  GLUCOSE 270*  --   --  118* 196* 132*  BUN 128*  --   --  114* 95* 87*  CREATININE 2.07*  --   --  1.81* 1.44* 1.26*  CALCIUM 10.1  --   --  9.6 9.0 9.1  MG  --   --  2.5*  --   --  2.3  PHOS  --   --  4.0  --   --  2.7   GFR: Estimated Creatinine Clearance: 34.3 mL/min (A) (by C-G formula based on SCr of 1.26 mg/dL (H)). Liver Function Tests: Recent Labs  Lab 06/09/23 2031 06/10/23 0419 06/11/23 0340  AST 49* 50* 50*  ALT 125* 109* 105*  ALKPHOS 308* 269* 245*  BILITOT 0.5 0.6 0.4  PROT 8.4* 7.0 6.5  ALBUMIN 3.6 3.2* 3.0*   Recent Labs  Lab 06/09/23 2031  LIPASE 48   No results for input(s): "AMMONIA" in the last 168 hours. Coagulation Profile: No results for input(s): "INR", "PROTIME" in the last 168 hours. Cardiac Enzymes: No results for input(s): "CKTOTAL", "CKMB", "CKMBINDEX", "TROPONINI" in the  last 168 hours. BNP (last 3 results) No results for input(s): "PROBNP" in the last 8760 hours. HbA1C: No results for input(s): "HGBA1C" in the last 72 hours. CBG: Recent Labs  Lab 06/10/23 1745 06/11/23 0001 06/11/23 0604  GLUCAP 162* 148* 150*   Lipid Profile: Recent Labs    06/11/23 0340  TRIG 191*   Thyroid Function Tests: Recent Labs    06/10/23 0419  TSH 0.382   Anemia Panel: Recent Labs    06/10/23 0419  VITAMINB12 601  FOLATE 8.9   Sepsis Labs: No results for input(s): "PROCALCITON", "LATICACIDVEN" in the last 168 hours.  Recent Results (from the past 240 hours)  Resp panel by RT-PCR (RSV, Flu A&B, Covid) Anterior Nasal Swab     Status: None   Collection Time: 06/09/23  8:47 PM   Specimen: Anterior Nasal Swab  Result Value Ref Range Status   SARS  Coronavirus 2 by RT PCR NEGATIVE NEGATIVE Final    Comment: (NOTE) SARS-CoV-2 target nucleic acids are NOT DETECTED.  The SARS-CoV-2 RNA is generally detectable in upper respiratory specimens during the acute phase of infection. The lowest concentration of SARS-CoV-2 viral copies this assay can detect is 138 copies/mL. A negative result does not preclude SARS-Cov-2 infection and should not be used as the sole basis for treatment or other patient management decisions. A negative result may occur with  improper specimen collection/handling, submission of specimen other than nasopharyngeal swab, presence of viral mutation(s) within the areas targeted by this assay, and inadequate number of viral copies(<138 copies/mL). A negative result must be combined with clinical observations, patient history, and epidemiological information. The expected result is Negative.  Fact Sheet for Patients:  BloggerCourse.com  Fact Sheet for Healthcare Providers:  SeriousBroker.it  This test is no t yet approved or cleared by the [place] FDA and  has been authorized for detection and/or diagnosis of SARS-CoV-2 by FDA under an Emergency Use Authorization (EUA). This EUA will remain  in effect (meaning this test can be used) for the duration of the COVID-19 declaration under Section 564(b)(1) of the Act, 21 U.S.C.section 360bbb-3(b)(1), unless the authorization is terminated  or revoked sooner.       Influenza A by PCR NEGATIVE NEGATIVE Final   Influenza B by PCR NEGATIVE NEGATIVE Final    Comment: (NOTE) The Xpert Xpress SARS-CoV-2/FLU/RSV plus assay is intended as an aid in the diagnosis of influenza from Nasopharyngeal swab specimens and should not be used as a sole basis for treatment. Nasal washings and aspirates are unacceptable for Xpert Xpress SARS-CoV-2/FLU/RSV testing.  Fact Sheet for  Patients: BloggerCourse.com  Fact Sheet for Healthcare Providers: SeriousBroker.it  This test is not yet approved or cleared by the [place] FDA and has been authorized for detection and/or diagnosis of SARS-CoV-2 by FDA under an Emergency Use Authorization (EUA). This EUA will remain in effect (meaning this test can be used) for the duration of the COVID-19 declaration under Section 564(b)(1) of the Act, 21 U.S.C. section 360bbb-3(b)(1), unless the authorization is terminated or revoked.     Resp Syncytial Virus by PCR NEGATIVE NEGATIVE Final    Comment: (NOTE) Fact Sheet for Patients: BloggerCourse.com  Fact Sheet for Healthcare Providers: SeriousBroker.it  This test is not yet approved or cleared by the [place] FDA and has been authorized for detection and/or diagnosis of SARS-CoV-2 by FDA under an Emergency Use Authorization (EUA). This EUA will remain in effect (meaning this test can be used) for the duration of the  COVID-19 declaration under Section 564(b)(1) of the Act, 21 U.S.C. section 360bbb-3(b)(1), unless the authorization is terminated or revoked.  Performed at [facility], [address] [place], [place] [address]   MRSA Next Gen by PCR, Nasal     Status: Abnormal   Collection Time: 06/10/23  2:30 AM   Specimen: Nasal Mucosa; Nasal Swab  Result Value Ref Range Status   MRSA by PCR Next Gen DETECTED (A) NOT DETECTED Final    Comment: (NOTE) The GeneXpert MRSA Assay (FDA approved for NASAL specimens only), is one component of a comprehensive MRSA colonization surveillance program. It is not intended to diagnose MRSA infection nor to guide or monitor treatment for MRSA infections. Test performance is not FDA approved in patients less than 31 years old. Performed at [facility], [address] [place],  [place] [address]          Radiology Studies: CT ABDOMEN PELVIS WO CONTRAST Result Date: 06/09/2023 CLINICAL DATA:  Nausea and vomiting. EXAM: CT ABDOMEN AND PELVIS WITHOUT CONTRAST TECHNIQUE: Multidetector CT imaging of the abdomen and pelvis was performed following the standard protocol without IV contrast. RADIATION DOSE REDUCTION: This exam was performed according to the departmental dose-optimization program which includes automated exposure control, adjustment of the mA and/or kV according to patient size and/or use of iterative reconstruction technique. COMPARISON:  April 28, 2023 FINDINGS: Lower chest: A 4 mm noncalcified lung nodule is seen within the anterolateral aspect of the right lower lobe (axial CT image 2, CT series 5). This is not clearly identified on the prior study. Hepatobiliary: No focal liver abnormality is seen. Status post cholecystectomy. No biliary dilatation. Pancreas: Unremarkable. No pancreatic ductal dilatation or surrounding inflammatory changes. Spleen: Normal in size without focal abnormality. Adrenals/Urinary Tract: The right adrenal gland is unremarkable. A stable 19 mm diameter low-attenuation (approximately 17.58 Hounsfield units) left adrenal mass is noted. The left kidney is atrophic in appearance with diffuse renal cortical thinning noted. No focal renal lesions are identified. There is a percutaneous right-sided nephrostomy tube in place. A right-sided endo ureteral stent is also seen. There is moderate to marked severity right-sided hydronephrosis and proximal hydroureter. Mild to moderate severity left-sided hydronephrosis and proximal hydroureter is also seen with markedly dilated intrarenal calices on the left. A moderate amount of air is seen within the lumen of a markedly distended urinary bladder. Stomach/Bowel: Stomach is within normal limits. The appendix is not identified. Surgically anastomosed bowel is seen within the anterior aspect of the right lower  quadrant. A right lower quadrant ostomy site is also seen. No evidence of bowel dilatation. Stable mild to moderate severity circumferential thickening of the distal sigmoid colon is noted. Vascular/Lymphatic: Aortic atherosclerosis. No enlarged abdominal or pelvic lymph nodes. Reproductive: Status post hysterectomy. No adnexal masses. Stable postoperative changes are seen within the posterior aspect of the pelvis. Other: There is a stable appearing right lower quadrant ostomy site. No abdominopelvic ascites. Musculoskeletal: There is stable, chronic presacral soft tissue thickening. Multilevel degenerative changes are noted throughout the lumbar spine. IMPRESSION: 1. Moderate to marked severity right-sided hydronephrosis and proximal hydroureter with a percutaneous right-sided nephrostomy tube and right-sided endo ureteral stent in place. 2. Mild to moderate severity left-sided hydronephrosis and proximal hydroureter with markedly dilated intrarenal calices on the left. 3. Stable postoperative changes within the posterior aspect of the pelvis. 4. Stable left adrenal adenoma. No follow-up imaging is recommended. 5. 4 mm noncalcified right lower lobe lung nodule. Correlation with follow-up  nonemergent dedicated chest CT is recommended. 6. Aortic atherosclerosis. Aortic Atherosclerosis (ICD10-I70.0). Electronically Signed   By: Aram Candela M.D.   On: 06/09/2023 22:40   DG Abdomen Acute W/Chest Result Date: 06/09/2023 CLINICAL DATA:  Nausea and vomiting. EXAM: DG ABDOMEN ACUTE WITH 1 VIEW CHEST COMPARISON:  April 28, 2023 FINDINGS: The right-sided venous catheter seen on the prior study has been removed and has been replaced with a left-sided venous catheter. Its distal tip is seen at the junction of the superior vena cava and right atrium. There is no evidence of dilated bowel loops or free intraperitoneal air. A percutaneous nephrostomy tube is suspected on the right. A right-sided endo ureteral stent is  also seen. No radiopaque calculi or other significant radiographic abnormality is seen. Heart size and mediastinal contours are within normal limits. Mild to moderate severity calcification of the aortic arch is seen. Both lungs are clear. IMPRESSION: 1. No acute cardiopulmonary disease. 2. Nonobstructive bowel gas pattern. 3. Right-sided percutaneous nephrostomy tube and right-sided endo ureteral stent. Electronically Signed   By: Aram Candela M.D.   On: 06/09/2023 22:25        Scheduled Meds:  acetaminophen  1,000 mg Oral Once   Chlorhexidine Gluconate Cloth  6 each Topical Q0600   divalproex  125 mg Oral QHS   enoxaparin (LOVENOX) injection  40 mg Subcutaneous Q24H   feeding supplement  237 mL Oral BID BM   insulin aspart  0-9 Units Subcutaneous Q6H   levothyroxine  25 mcg Oral Q0600   mirtazapine  7.5 mg Oral QHS   mupirocin ointment  1 Application Nasal BID   pantoprazole  40 mg Oral Daily   sertraline  25 mg Oral Daily   sodium chloride flush  10-40 mL Intracatheter Q12H   thiamine  100 mg Oral Daily   Continuous Infusions:  TPN (CLINIMIX-E) Adult     And   fat emul(SMOFlipid)     TPN (CLINIMIX-E) Adult 42 mL/hr at 06/10/23 1718          Glade Lloyd, MD [facility] 06/11/2023, 11:05 AM

## 2023-06-11 NOTE — Plan of Care (Signed)
  Problem: Activity: Goal: Risk for activity intolerance will decrease Outcome: Progressing   Problem: Pain Management: Goal: General experience of comfort will improve Outcome: Progressing   Problem: Safety: Goal: Ability to remain free from injury will improve Outcome: Progressing

## 2023-06-11 NOTE — Evaluation (Signed)
Physical Therapy Evaluation Patient Details Name: [name] MRN: [id number] DOB: 11/01/1955 Today's Date: 06/11/2023  History of Present Illness  Pt is a 67 year old woman admitted 12/23 from [facility], where she was receiving rehab, with hyponatremia and AKI associated with nausea and vomiting. PMH: cervical cancer, colovaginal fistula, multiple abdominal surgeries, recent nephrostomy due to hydronephrosis, CKD, anxiety, headaches, UTI.  Clinical Impression  Pt admitted as above.  Per husband, pt was using a RW at SNF and assisted for basic ADLs. Pt with agitation and impulsivity throughout session, tearful at times. Pt presents with generalized weakness, decreased activity tolerance and impaired standing balance. She ambulated with +2 hand held assist in hall with close chair follow as pt tends to sit abruptly. Patient will benefit from continued inpatient follow up therapy, <3 hours/day.       If plan is discharge home, recommend the following: Two people to help with walking and/or transfers;A little help with bathing/dressing/bathroom;Assistance with cooking/housework;Assist for transportation;Help with stairs or ramp for entrance   Can travel by private vehicle   No    Equipment Recommendations None recommended by PT  Recommendations for Other Services       Functional Status Assessment Patient has had a recent decline in their functional status and demonstrates the ability to make significant improvements in function in a reasonable and predictable amount of time.     Precautions / Restrictions Precautions Precautions: Fall Precaution Comments: TPN, ileostomy, R nephrostomy drain Restrictions Weight Bearing Restrictions Per Provider Order: No      Mobility  Bed Mobility Overal bed mobility: Needs Assistance Bed Mobility: Supine to Sit     Supine to sit: Min assist, HOB elevated     General bed mobility comments: assist to raise trunk, pulling up on  husband's hand    Transfers Overall transfer level: Needs assistance Equipment used: 1 person hand held assist Transfers: Sit to/from Stand Sit to Stand: Min assist, +2 safety/equipment           General transfer comment: assist to rise and steady, posterior bias upon initially standing, impulsively ambulating with B hand held assist and chair follow    Ambulation/Gait Ambulation/Gait assistance: +2 physical assistance Gait Distance (Feet): 50 Feet Assistive device: None (Pt refuses RW) Gait Pattern/deviations: Staggering left, Staggering right, Shuffle       General Gait Details: Unsteady but resistant to use of RW. Increasingly agitated and states I need to sit but no reason given  Stairs            Wheelchair Mobility     Tilt Bed    Modified Rankin (Stroke Patients Only)       Balance Overall balance assessment: Needs assistance   Sitting balance-Leahy Scale: Fair       Standing balance-Leahy Scale: Poor                               Pertinent Vitals/Pain Pain Assessment Pain Assessment: Faces Faces Pain Scale: Hurts little more Pain Location: head Pain Descriptors / Indicators: Aching Pain Intervention(s): Limited activity within patient's tolerance, Monitored during session    Home Living Family/patient expects to be discharged to:: Skilled nursing facility                        Prior Function Prior Level of Function : Needs assist  Mobility Comments: was walking with RW at facility ADLs Comments: assisted for bathing, dressing and toileting, self fed and participated in grooming     Extremity/Trunk Assessment   Upper Extremity Assessment Upper Extremity Assessment: Generalized weakness    Lower Extremity Assessment Lower Extremity Assessment: Generalized weakness    Cervical / Trunk Assessment Cervical / Trunk Assessment: Other exceptions Cervical / Trunk Exceptions: weakness   Communication   Communication Communication: No apparent difficulties  Cognition Arousal: Alert Behavior During Therapy: Impulsive, Flat affect, Agitated Overall Cognitive Status: Impaired/Different from baseline Area of Impairment: Orientation, Attention, Memory, Following commands, Safety/judgement, Awareness, Problem solving                 Orientation Level: Disoriented to, Place, Time, Situation Current Attention Level: Focused Memory: Decreased short-term memory Following Commands: Follows one step commands with increased time Safety/Judgement: Decreased awareness of safety, Decreased awareness of deficits Awareness: Intellectual Problem Solving: Slow processing, Decreased initiation, Difficulty sequencing, Requires verbal cues, Requires tactile cues          General Comments      Exercises     Assessment/Plan    PT Assessment Patient needs continued PT services  PT Problem List Decreased strength;Decreased activity tolerance;Decreased balance;Decreased mobility;Decreased cognition;Decreased knowledge of use of DME;Pain       PT Treatment Interventions DME instruction;Gait training;Functional mobility training;Therapeutic activities;Therapeutic exercise;Balance training;Patient/family education    PT Goals (Current goals can be found in the Care Plan section)  Acute Rehab PT Goals Patient Stated Goal: Regain IND per husband - pt unable PT Goal Formulation: With family Time For Goal Achievement: 06/25/23 Potential to Achieve Goals: Fair    Frequency Min 1X/week     Co-evaluation   Reason for Co-Treatment: Necessary to address cognition/behavior during functional activity;For patient/therapist safety           AM-PAC PT "6 Clicks" Mobility  Outcome Measure Help needed turning from your back to your side while in a flat bed without using bedrails?: A Little Help needed moving from lying on your back to sitting on the side of a flat bed without using  bedrails?: A Little Help needed moving to and from a bed to a chair (including a wheelchair)?: A Little Help needed standing up from a chair using your arms (e.g., wheelchair or bedside chair)?: A Little Help needed to walk in hospital room?: A Lot Help needed climbing 3-5 steps with a railing? : Total 6 Click Score: 15    End of Session   Activity Tolerance: Patient limited by fatigue Patient left: in chair;with call bell/phone within reach;with family/visitor present;with chair alarm set Nurse Communication: Mobility status PT Visit Diagnosis: Unsteadiness on feet (R26.81);Muscle weakness (generalized) (M62.81);Difficulty in walking, not elsewhere classified (R26.2)    Time: 0902-0920 PT Time Calculation (min) (ACUTE ONLY): 18 min   Charges:   PT Evaluation $PT Eval Moderate Complexity: 1 Mod   PT General Charges $$ ACUTE PT VISIT: 1 Visit         Mauro Kaufmann PT Acute Rehabilitation Services Pager [phone] Office [phone]   Danni Shima 06/11/2023, 1:01 PM

## 2023-06-11 NOTE — Evaluation (Signed)
Occupational Therapy Evaluation Patient Details Name: [name] MRN: [id number] DOB: [birth year] Today's Date: 06/11/2023   History of Present Illness Pt is a 67 year old woman admitted 12/23 from [facility], where she was receiving rehab, with hyponatremia and AKI associated with nausea and vomiting. PMH: cervical cancer, colovaginal fistula, multiple abdominal surgeries, recent nephrostomy due to hydronephrosis, CKD, anxiety, headaches, UTI.   Clinical Impression   Per husband, pt was using a RW at SNF and assisted for basic ADLs. Pt with agitation and impulsivity throughout session, tearful at times. Pt presents with generalized weakness, decreased activity tolerance and impaired standing balance. She ambulated with +2 hand held assist in hall with close chair follow as pt tends to sit abruptly. Pt requires min to total assistance for ADLs. Patient will benefit from continued inpatient follow up therapy, <3 hours/day.      If plan is discharge home, recommend the following: Two people to help with walking and/or transfers;A lot of help with bathing/dressing/bathroom;Assistance with cooking/housework;Direct supervision/assist for medications management;Direct supervision/assist for financial management;Assist for transportation;Help with stairs or ramp for entrance    Functional Status Assessment  Patient has had a recent decline in their functional status and demonstrates the ability to make significant improvements in function in a reasonable and predictable amount of time.  Equipment Recommendations  Other (comment) (defer)    Recommendations for Other Services       Precautions / Restrictions Precautions Precautions: Fall Precaution Comments: TPN, ileostomy, R nephrostomy drain Restrictions Weight Bearing Restrictions Per Provider Order: No      Mobility Bed Mobility Overal bed mobility: Needs Assistance Bed Mobility: Supine to Sit     Supine to sit: Min  assist, HOB elevated     General bed mobility comments: assist to raise trunk, pulling up on husband's hand    Transfers Overall transfer level: Needs assistance Equipment used: 1 person hand held assist Transfers: Sit to/from Stand Sit to Stand: Min assist, +2 safety/equipment           General transfer comment: assist to rise and steady, posterior bias upon initially standing, impulsively ambulating with B hand held assist and chair follow      Balance Overall balance assessment: Needs assistance   Sitting balance-Leahy Scale: Fair       Standing balance-Leahy Scale: Poor                             ADL either performed or assessed with clinical judgement   ADL Overall ADL's : Needs assistance/impaired Eating/Feeding: Set up;Sitting   Grooming: Moderate assistance;Sitting   Upper Body Bathing: Maximal assistance;Sitting   Lower Body Bathing: Total assistance;Sit to/from stand   Upper Body Dressing : Maximal assistance;Sitting   Lower Body Dressing: Total assistance;Sit to/from stand   Toilet Transfer: +2 for physical assistance;Minimal assistance;Ambulation   Toileting- Clothing Manipulation and Hygiene: Total assistance;Sit to/from stand;+2 for safety/equipment       Functional mobility during ADLs: Minimal assistance;+2 for physical assistance       Vision Baseline Vision/History: 0 No visual deficits Ability to See in Adequate Light: 0 Adequate       Perception         Praxis         Pertinent Vitals/Pain Pain Assessment Pain Assessment: Faces Faces Pain Scale: Hurts little more Pain Location: head Pain Descriptors / Indicators: Aching Pain Intervention(s): Monitored during session     Extremity/Trunk Assessment Upper Extremity  Assessment Upper Extremity Assessment: Generalized weakness;Overall [facility] for tasks assessed   Lower Extremity Assessment Lower Extremity Assessment: Defer to PT evaluation   Cervical / Trunk  Assessment Cervical / Trunk Assessment: Other exceptions (weakness)   Communication Communication Communication: No apparent difficulties   Cognition Arousal: Alert Behavior During Therapy: Impulsive, Flat affect, Agitated Overall Cognitive Status: Impaired/Different from baseline Area of Impairment: Orientation, Attention, Memory, Following commands, Safety/judgement, Awareness, Problem solving                 Orientation Level: Disoriented to, Place, Time, Situation Current Attention Level: Focused Memory: Decreased short-term memory Following Commands: Follows one step commands with increased time (and multimodal cues) Safety/Judgement: Decreased awareness of safety, Decreased awareness of deficits Awareness: Intellectual Problem Solving: Slow processing, Decreased initiation, Difficulty sequencing, Requires verbal cues, Requires tactile cues       General Comments       Exercises     Shoulder Instructions      Home Living Family/patient expects to be discharged to:: Skilled nursing facility                                        Prior Functioning/Environment Prior Level of Function : Needs assist             Mobility Comments: was walking with RW at facility ADLs Comments: assisted for bathing, dressing and toileting, self fed and participated in grooming        OT Problem List: Decreased strength;Impaired balance (sitting and/or standing);Decreased cognition;Decreased safety awareness;Decreased knowledge of precautions;Pain      OT Treatment/Interventions: Self-care/ADL training;DME and/or AE instruction;Patient/family education;Balance training;Therapeutic activities;Cognitive remediation/compensation    OT Goals(Current goals can be found in the care plan section) Acute Rehab OT Goals OT Goal Formulation: With family Time For Goal Achievement: 06/25/23 Potential to Achieve Goals: Good ADL Goals Pt Will Perform Grooming: with min  assist;standing Pt Will Perform Upper Body Bathing: with min assist;sitting Pt Will Perform Upper Body Dressing: with min assist;sitting Pt Will Transfer to Toilet: with min assist;ambulating;bedside commode Additional ADL Goal #1: Pt will complete bed mobility with supervision in preparation for ADLs.  OT Frequency: Min 1X/week    Co-evaluation PT/OT/SLP Co-Evaluation/Treatment: Yes Reason for Co-Treatment: Necessary to address cognition/behavior during functional activity;For patient/therapist safety          AM-PAC OT "6 Clicks" Daily Activity     Outcome Measure Help from another person eating meals?: A Little Help from another person taking care of personal grooming?: A Lot Help from another person toileting, which includes using toliet, bedpan, or urinal?: Total Help from another person bathing (including washing, rinsing, drying)?: A Lot Help from another person to put on and taking off regular upper body clothing?: A Lot Help from another person to put on and taking off regular lower body clothing?: Total 6 Click Score: 11   End of Session Nurse Communication: Mobility status;Other (comment) (pt pulled of telemetry, resists replacement)  Activity Tolerance: Treatment limited secondary to agitation Patient left: in chair;with call bell/phone within reach;with chair alarm set  OT Visit Diagnosis: Unsteadiness on feet (R26.81);Other abnormalities of gait and mobility (R26.89);Muscle weakness (generalized) (M62.81);Hemiplegia and hemiparesis;Other symptoms and signs involving cognitive function                Time: 0902-0920 OT Time Calculation (min): 18 min Charges:  OT General Charges $OT Visit: 1 Visit OT  Evaluation $OT Eval Moderate Complexity: 1 Mod  Berna Spare, OTR/L Acute Rehabilitation Services Office: [phone]  Evern Bio 06/11/2023, 10:13 AM

## 2023-06-12 DIAGNOSIS — E871 Hypo-osmolality and hyponatremia: Secondary | ICD-10-CM | POA: Diagnosis not present

## 2023-06-12 LAB — PHOSPHORUS: Phosphorus: 2.4 mg/dL — ABNORMAL LOW (ref 2.5–4.6)

## 2023-06-12 LAB — CBC WITH DIFFERENTIAL/PLATELET
Abs Immature Granulocytes: 0.07 10*3/uL (ref 0.00–0.07)
Basophils Absolute: 0.1 10*3/uL (ref 0.0–0.1)
Basophils Relative: 1 %
Eosinophils Absolute: 0.3 10*3/uL (ref 0.0–0.5)
Eosinophils Relative: 4 %
HCT: 29.8 % — ABNORMAL LOW (ref 36.0–46.0)
Hemoglobin: 10.1 g/dL — ABNORMAL LOW (ref 12.0–15.0)
Immature Granulocytes: 1 %
Lymphocytes Relative: 28 %
Lymphs Abs: 2.1 10*3/uL (ref 0.7–4.0)
MCH: 30.1 pg (ref 26.0–34.0)
MCHC: 33.9 g/dL (ref 30.0–36.0)
MCV: 88.7 fL (ref 80.0–100.0)
Monocytes Absolute: 0.6 10*3/uL (ref 0.1–1.0)
Monocytes Relative: 8 %
Neutro Abs: 4.2 10*3/uL (ref 1.7–7.7)
Neutrophils Relative %: 58 %
Platelets: 217 10*3/uL (ref 150–400)
RBC: 3.36 MIL/uL — ABNORMAL LOW (ref 3.87–5.11)
RDW: 15.1 % (ref 11.5–15.5)
WBC: 7.3 10*3/uL (ref 4.0–10.5)
nRBC: 0 % (ref 0.0–0.2)

## 2023-06-12 LAB — COMPREHENSIVE METABOLIC PANEL
ALT: 153 U/L — ABNORMAL HIGH (ref 0–44)
AST: 112 U/L — ABNORMAL HIGH (ref 15–41)
Albumin: 2.9 g/dL — ABNORMAL LOW (ref 3.5–5.0)
Alkaline Phosphatase: 258 U/L — ABNORMAL HIGH (ref 38–126)
Anion gap: 7 (ref 5–15)
BUN: 68 mg/dL — ABNORMAL HIGH (ref 8–23)
CO2: 16 mmol/L — ABNORMAL LOW (ref 22–32)
Calcium: 9 mg/dL (ref 8.9–10.3)
Chloride: 105 mmol/L (ref 98–111)
Creatinine, Ser: 0.94 mg/dL (ref 0.44–1.00)
GFR, Estimated: >60 mL/min (ref >60)
Glucose, Bld: 84 mg/dL (ref 70–99)
Potassium: 4.1 mmol/L (ref 3.5–5.1)
Sodium: 128 mmol/L — ABNORMAL LOW (ref 135–145)
Total Bilirubin: 0.3 mg/dL (ref <1.2)
Total Protein: 6.4 g/dL — ABNORMAL LOW (ref 6.5–8.1)

## 2023-06-12 LAB — URINE CULTURE

## 2023-06-12 LAB — TRIGLYCERIDES: Triglycerides: 166 mg/dL — ABNORMAL HIGH (ref <150)

## 2023-06-12 LAB — GLUCOSE, CAPILLARY
Glucose-Capillary: 115 mg/dL — ABNORMAL HIGH (ref 70–99)
Glucose-Capillary: 125 mg/dL — ABNORMAL HIGH (ref 70–99)
Glucose-Capillary: 164 mg/dL — ABNORMAL HIGH (ref 70–99)
Glucose-Capillary: 172 mg/dL — ABNORMAL HIGH (ref 70–99)
Glucose-Capillary: 153 mg/dL — ABNORMAL HIGH (ref 70–99)

## 2023-06-12 LAB — MAGNESIUM: Magnesium: 2 mg/dL (ref 1.7–2.4)

## 2023-06-12 MED ORDER — SODIUM BICARBONATE 8.4 % IV SOLN
INTRAVENOUS | Status: DC
  Filled 2023-06-12: qty 1000
  Filled 2023-06-12 (×2): qty 150

## 2023-06-12 MED ORDER — FAT EMUL FISH OIL/PLANT BASED 20% (SMOFLIPID)IV EMUL
250.0000 mL | INTRAVENOUS | Status: AC
Start: 2023-06-12 — End: 2023-06-13
  Administered 2023-06-12: 250 mL via INTRAVENOUS
  Filled 2023-06-12: qty 250

## 2023-06-12 MED ORDER — SODIUM PHOSPHATES 45 MMOLE/15ML IV SOLN
15.0000 mmol | Freq: Once | INTRAVENOUS | Status: AC
  Administered 2023-06-12: 15 mmol via INTRAVENOUS
  Filled 2023-06-12: qty 5

## 2023-06-12 MED ORDER — TRACE MINERALS CU-MN-SE-ZN 300-55-60-3000 MCG/ML IV SOLN
INTRAVENOUS | Status: AC
  Filled 2023-06-12 (×2): qty 1000

## 2023-06-12 NOTE — NC FL2 (Signed)
[place] MEDICAID FL2 LEVEL OF CARE FORM     IDENTIFICATION  Patient Name: [name] Birthdate: [birth year] Sex: female Admission Date (Current Location): 06/09/2023  [facility] and [place] Number:  Producer, television/film/video and Address:  [facility],  [address] [place], [place] [address]      Provider Number: [id number]  Attending Physician Name and Address:  Glade Lloyd, MD  Relative Name and Phone Number:  Stephana, Hadad (Spouse)  [phone] (Mobile)    Current Level of Care: Hospital Recommended Level of Care: Skilled Nursing Facility Prior Approval Number:    Date Approved/Denied:   PASRR Number: [id number]  Discharge Plan: SNF    Current Diagnoses: Patient Active Problem List   Diagnosis Date Noted   Tremors of nervous system 06/10/2023   Hyponatremia 06/09/2023   Failure to thrive in adult 06/09/2023   Nausea & vomiting 06/09/2023   Transaminitis 06/09/2023   Metabolic acidosis with normal anion gap and bicarbonate losses 06/09/2023   Bilateral hydronephrosis 06/09/2023   Subacute confusional state 11/24/2022   Memory loss 11/24/2022   Hypokalemia 11/19/2022   Renal atrophy, left 11/19/2022   Acute-on-chronic kidney injury (HCC) 11/18/2022   SBO (small bowel obstruction) (HCC) 11/17/2022   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   Seborrheic dermatitis 11/04/2022   Senile purpura (HCC) 11/04/2022   Telogen effluvium 11/04/2022   Neck pain 05/07/2022   Right ear pain 05/07/2022   Tympanic membrane perforation, marginal, right 02/27/2022   CKD (chronic kidney disease) stage 3, GFR 30-59 ml/min (HCC) 05/24/2021   Dysuria 04/16/2021   Vaginal irritation 04/16/2021   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   Imbalance 04/10/2021   Tinnitus aurium, right 04/10/2021   Non-recurrent acute serous otitis media of right ear 12/13/2020   Unspecified intestinal obstruction, unspecified as to partial versus complete obstruction (HCC)  10/27/2020   Constipation 10/25/2020   Nausea 10/25/2020   Hypothyroidism 06/24/2018   Smoker 06/24/2018   History of cervical cancer 06/24/2018   Mixed hyperlipidemia 06/24/2018   Essential tremor 05/08/2018   Chronic daily headache 05/06/2018   Lumbar spine painful on movement 05/06/2018   Vertigo 05/06/2018   Leg cramps 05/06/2018   Allergic rhinitis 04/09/2017   Anxiety 04/09/2017   Need for immunization against influenza 04/09/2017   Annual physical exam 04/09/2017    Orientation RESPIRATION BLADDER Height & Weight     Self, Place  Normal Continent Weight: 110 lb 7.2 oz (50.1 kg) Height:  5\' 2"  (157.5 cm)  BEHAVIORAL SYMPTOMS/MOOD NEUROLOGICAL BOWEL NUTRITION STATUS      Ileostomy Diet (regular)  AMBULATORY STATUS COMMUNICATION OF NEEDS Skin   Limited Assist Verbally PU Stage and Appropriate Care (sacurm)   PU Stage 2 Dressing: BID                   Personal Care Assistance Level of Assistance  Bathing, Feeding, Dressing Bathing Assistance: Limited assistance Feeding assistance: Independent Dressing Assistance: Limited assistance     Functional Limitations Info  Sight, Hearing, Speech Sight Info: Adequate Hearing Info: Adequate Speech Info: Adequate    SPECIAL CARE FACTORS FREQUENCY  PT (By licensed PT), OT (By licensed OT)     PT Frequency: 5 x a week OT Frequency: 5 x a week            Contractures Contractures Info: Not present    Additional Factors Info  Code Status, Allergies Code Status Info: full Allergies Info: Percocet (oxycodone-acetaminophen),Barium-containing Compounds  Current Medications (06/12/2023):  This is the current hospital active medication list Current Facility-Administered Medications  Medication Dose Route Frequency Provider Last Rate Last Admin   acetaminophen (TYLENOL) tablet 1,000 mg  1,000 mg Oral Once Fayrene Helper, PA-C       acetaminophen (TYLENOL) tablet 650 mg  650 mg Oral Q6H PRN Glade Lloyd, MD    650 mg at 06/11/23 1757   cefTRIAXone (ROCEPHIN) 1 g in sodium chloride 0.9 % 100 mL IVPB  1 g Intravenous Q24H Alekh, Kshitiz, MD 200 mL/hr at 06/11/23 1308 1 g at 06/11/23 1308   Chlorhexidine Gluconate Cloth 2 % PADS 6 each  6 each Topical Q0600 Benita Gutter T, DO   6 each at 06/12/23 0639   divalproex (DEPAKOTE) DR tablet 125 mg  125 mg Oral QHS Tu, Ching T, DO   125 mg at 06/11/23 2321   enoxaparin (LOVENOX) injection 40 mg  40 mg Subcutaneous Q24H Pham, Anh P, RPH   40 mg at 06/11/23 1309   feeding supplement (ENSURE ENLIVE / ENSURE PLUS) liquid 237 mL  237 mL Oral BID BM Alekh, Kshitiz, MD   237 mL at 06/11/23 1758   insulin aspart (novoLOG) injection 0-9 Units  0-9 Units Subcutaneous Q6H Jamse Mead, [facility]   2 Units at 06/12/23 0032   levothyroxine (SYNTHROID) tablet 25 mcg  25 mcg Oral Q0600 Tu, Ching T, DO   25 mcg at 06/12/23 0636   lip balm (CARMEX) ointment   Topical PRN Benita Gutter T, DO   1 Application at 06/10/23 0540   mirtazapine (REMERON) tablet 7.5 mg  7.5 mg Oral QHS Tu, Ching T, DO   7.5 mg at 06/11/23 2321   mupirocin ointment (BACTROBAN) 2 % 1 Application  1 Application Nasal BID Tu, Ching T, DO   1 Application at 06/11/23 2325   ondansetron (ZOFRAN) injection 4 mg  4 mg Intravenous Q6H PRN Tu, Ching T, DO       Oral care mouth rinse  15 mL Mouth Rinse PRN Glade Lloyd, MD       pantoprazole (PROTONIX) EC tablet 40 mg  40 mg Oral Daily Tu, Ching T, DO   40 mg at 06/11/23 1250   sertraline (ZOLOFT) tablet 25 mg  25 mg Oral Daily Tu, Ching T, DO   25 mg at 06/11/23 1249   sodium bicarbonate 150 mEq in dextrose 5 % 1,150 mL infusion   Intravenous Continuous Alekh, Kshitiz, MD       sodium chloride flush (NS) 0.9 % injection 10-40 mL  10-40 mL Intracatheter Q12H Alekh, Kshitiz, MD   10 mL at 06/10/23 2250   thiamine (VITAMIN B1) tablet 100 mg  100 mg Oral Daily Hanley Ben, Kshitiz, MD   100 mg at 06/11/23 1249   TPN (CLINIMIX-E) Adult   Intravenous Continuous TPN Lucia Gaskins, RPH 42  mL/hr at 06/11/23 1742 New Bag at 06/11/23 1742     Discharge Medications: Please see discharge summary for a list of discharge medications.  Relevant Imaging Results:  Relevant Lab Results:   Additional Information SSN:[id number]  Valentina Shaggy Aiman Noe, LCSW

## 2023-06-12 NOTE — Plan of Care (Signed)
  Problem: Clinical Measurements: Goal: Diagnostic test results will improve Outcome: Progressing   Problem: Activity: Goal: Risk for activity intolerance will decrease Outcome: Progressing   Problem: Nutrition: Goal: Adequate nutrition will be maintained Outcome: Progressing   Problem: Pain Management: Goal: General experience of comfort will improve Outcome: Progressing   Problem: Safety: Goal: Ability to remain free from injury will improve Outcome: Progressing

## 2023-06-12 NOTE — TOC Progression Note (Signed)
Transition of Care [facility]) - Progression Note    Patient Details  Name: [name] MRN: [id number] Date of Birth: 05-Jul-1955  Transition of Care [facility] CM/SW Contact  Larrie Kass, LCSW Phone Number: 06/12/2023, 12:39 PM  Clinical Narrative:    CSW met with the patient and her husband, [name], to provide an update on placement. CSW sent the patient's information to [facility] as requested; however, the facility has declined to offer the patient a bed. CSW informed the patient and her husband that the patient's bed offers are still pending at this time. TOC to follow.     Expected Discharge Plan: Skilled Nursing Facility Barriers to Discharge: Continued Medical Work up  Expected Discharge Plan and Services       Living arrangements for the past 2 months: Single Family Home                                       Social Determinants of Health (SDOH) Interventions SDOH Screenings   Food Insecurity: No Food Insecurity (06/10/2023)  Housing: Low Risk  (06/10/2023)  Transportation Needs: No Transportation Needs (06/10/2023)  Utilities: Not At Risk (06/10/2023)  Depression (PHQ2-9): Low Risk  (12/06/2022)  Financial Resource Strain: Low Risk  (11/06/2022)  Stress: No Stress Concern Present (11/06/2022)  Tobacco Use: Medium Risk (06/09/2023)    Readmission Risk Interventions    11/21/2022   10:10 AM 11/18/2022    2:11 PM  Readmission Risk Prevention Plan  Post Dischage Appt  Complete  Medication Screening  Complete  Transportation Screening Complete Complete  HRI or Home Care Consult Complete   Social Work Consult for Recovery Care Planning/Counseling Complete   Palliative Care Screening Not Applicable   Medication Review Oceanographer) Complete

## 2023-06-12 NOTE — Plan of Care (Signed)
  Problem: Clinical Measurements: Goal: Diagnostic test results will improve Outcome: Progressing Goal: Respiratory complications will improve Outcome: Progressing Goal: Cardiovascular complication will be avoided Outcome: Progressing   Problem: Safety: Goal: Ability to remain free from injury will improve Outcome: Progressing   

## 2023-06-12 NOTE — Progress Notes (Addendum)
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   Assessment:  67 y/o F with PMH of cervical cancer and rectovaginal fistula s/p redo low anterior resection with colovaginal fistula takedown, fistula repair, and hand sewn end to end sigmoidrectal anastomosis with loop ileostomy on 11/23/18; closure of loop ileostomy 05/01/20, SBO s/p ex-lap with lysis of adhesions on 11/08/20, recurrent SBO s/p ex-lap, LOA, distal ileum and cecum resection with end ileostomy creation on 03/14/2023 started on TPN, right hydronephrosis s/p R ureteral stent placement on 04/02/2023 with subsequent obstructive uropathy, UTI, AKI and bilateral hydronephrosis with placement of right nephrostomy tube on 05/08/2023 who presents with nausea/vomiting and hyponatremia. Pharmacy consulted to resume TPN on admission.   Glucose / Insulin:  No history of DM.  - on sSSI (used 4 units in the past 24 hrs) - CBGs (goal <150): 150, 194, 123, 163, 115 Electrolytes: Na 128, CO2 16 down, phos 2.4  Renal: AKI on CKD stage IIIa - SCr trending down with 1.26 (crcl~34). BUN elevated but improved to 68 Hepatic: AST/ALT and  Alk Phos elevated and trending up now - monitor closely - Tbili WNL.  - TG slightly elevated at 166 but improved - Albumin low 2.9 Intake / Output; MIVF: I/O + 694 mL. MIVF changed to NaBicarb infusion at 49ml/hr on 12/26 and NS d/c'd - UOP: 0.6 ml/kg/hr GI Imaging:  - 12/23 CT abdomen and pelvis: did not show SBO but showed moderate to marked severity right-sided hydronephrosis and proximal hydroureter with percutaneous right-sided nephrostomy tube and right sided intraureteral stent in place along with mild to moderate severity left-sided hydronephrosis and  proximal hydroureter with markedly dilated intrarenal calyces on the left.  GI Surgeries / Procedures: see above  Central access: PICC line in place TPN start date: continued from PTA  Nutritional Goals: Due to the Baxter IV fluid disruption, all adult parenteral nutrition will utilize premade Clinimix products +/- fat emulsion infusion. Our goal will be to continue providing as close to 100% of our patient's nutritional needs, but due to limited TPN Clinimix concentrations we will meet at least 80% of protein and 75% of kcal goals.  Per discussion with [employer] [facility] [employer], patient was receiving Clinimix E 5/15 1000 mL over 24 hours and Intralipid 20% 250 mL over 12 hours while at facility   -Goal TPN rate (Clinimix 8/10) is 42 mL/hr (provides 80g of protein and 1164 kcals per day) - meets 100% of protein goal and 78% of kcal goals with TPN alone  -Also, each Ensure Plus supplement (ordered BID) provides 350 kcal and 20g of protein     RD Assessment: Estimated Needs Total Energy Estimated Needs: 1500-1700 Total Protein Estimated Needs: 75-90g Total Fluid Estimated Needs: 1.7L/day  Current Nutrition:  Regular diet Ensure Plus BID TPN to resume this PM  Plan:  Now: - NaPhos 15 mMol IV x 1  At 1800: - Continue Clinimix E 8/10 at 33mL/hr at 1800 - Electrolytes in TPN (fixed; unable to adjust in premixed Clinimix): Na 70mEq/L, K 33mEq/L, Ca 5mEq/L, Mg 31mEq/L, Phos 42mmol/L, Acetate 83 mEq/L, Cl 76 mEq/L - Add standard MVI and trace elements to TPN - SMOFlipid 20% IV over 12 hours. Will monitor TG closely and  may hold if TG increases significantly  - Continue  Sensitive q6h SSI and adjust as needed  - Thiamine 100mg  PO daily per RD recommendations - MIVF management per MD - currently on NaBicarb at 75 ml/hr - Monitor TPN labs on Mon/Thurs and prn   Hessie Knows, PharmD, BCPS Secure Chat if ?s 06/12/2023 10:40 AM

## 2023-06-12 NOTE — Progress Notes (Signed)
PROGRESS NOTE    [name]  XLK:[id number] DOB: Jun 06, 1956 DOA: 06/09/2023 PCP: Patient, [name]   Brief Narrative:  67 y.o. female with medical history significant of cervical cancer and rectovaginal fistula s/p redo low anterior resection with colovaginal fistula takedown, fistula repair, and hand sewn end to end sigmoidrectal anastomosis with loop ileostomy on 11/23/18; closure of loop ileostomy 05/01/20, SBO s/p ex-lap with lysis of adhesions on 11/08/20, recurrent SBO s/p ex-lap, LOA, distal ileum and cecum resection with end ileostomy creation on 03/14/2023 started on TPN, right hydronephrosis s/p R ureteral stent placement on 04/02/2023 with subsequent obstructive uropathy, UTI, AKI and bilateral hydronephrosis with placement of right nephrostomy tube on 05/08/2023 presented with nausea, vomiting and hyponatremia from SNF.  Patient has been on TPN recently after her recent hospitalization at [facility] and discharged to SNF.  On presentation, she was tachypneic with sodium of 121 (last sodium on 05/24/2023 in care everywhere was 130).  Creatinine was 2.07 (last creatinine was 1.09 on 05/24/2023) with bicarb of 18.  CT of abdomen and pelvis did not show SBO but showed moderate to marked severity right-sided hydronephrosis and proximal hydroureter with percutaneous right-sided nephrostomy tube and right sided intraureteral stent in place along with mild to moderate severity left-sided hydronephrosis and proximal hydroureter with markedly dilated intrarenal calyces on the left.  She was started on IV fluids.  Assessment & Plan:   Hyponatremia -Possibly due to decreased oral intake and likely GI loss. -Sodium 121 on presentation.  Sodium 128 this morning.  IV fluids as below..  Encourage oral intake.  DC'd sertraline.  Acute kidney injury on chronic kidney disease stage IIIa Acute metabolic acidosis Possible UTI: Present on admission History of bilateral hydronephrosis  status post ureteral stent and right nephrostomy tube -Presented with creatinine of 2.07, last creatinine was 1.09 on 05/24/2023.  Creatinine 0.94 this morning.  Monitor.  Bicarb 16 today.  Start bicarb drip. -Imaging as above.  Discussed with Dr. Elenore Paddy on phone on 06/10/2023: He reviewed prior records, notes and imaging and stated that patient has an almost atrophic/nonfunctional left kidney with possible chronic hydronephrosis and patient would not need any procedure for left hydronephrosis.  As long as right nephrostomy tube is draining and kidney function is improving, patient will need to follow-up with urology as an outpatient. -UA suggestive of possible UTI.  Continue Rocephin.  Follow urine culture.  Hypothyroidism -Continue levothyroxine  Hyperlipidemia -Hold statin due to transaminitis  Transaminitis -LFTs still mildly elevated.  CT imaging showed no biliary dilatation.  Monitor intermittently.  Nausea and vomiting -Has been an ongoing chronic issue for her due to her complex abdominal surgical history.  No small bowel obstruction demonstrated on CT imaging on presentation.  Continue as needed antiemetics.  Outpatient follow-up with primary surgical team  Failure to thrive in adult --Pt has extension abdominal surgery due to hx of cervical cancer and currently on TPN  -Palliative care evaluation appreciated.  Patient remains full code.  History of cervical cancer and rectovaginal fistula status post surgical intervention -Currently has an ileostomy.  Outpatient follow-up with oncology/GYN oncology/general surgery  Anxiety/depression -Continue sertraline and mirtazapine  Physical deconditioning -Patient from SNF.  PT recommending SNF placement.  Patient wants to go to a different SNF.  TOC consulted.   DVT prophylaxis: Lovenox Code Status: Full Family Communication: Husband and family members at bedside on 06/11/2023 Disposition Plan: Status is: inpatient because:  Of severity of illness  Consultants: Discussed with  urology/Dr. Laverle Patter on phone on 12 10/09/2022  Procedures: None  Antimicrobials: None   Subjective: Patient seen and examined at bedside.  Does not want to participate in conversation much.  No agitation, fever or vomiting reported. Objective: Vitals:   06/11/23 0457 06/11/23 1332 06/11/23 2040 06/12/23 0603  BP:  106/70 102/60 112/72  Pulse:  99 76 87  Resp:   16 17  Temp:  97.6 F (36.4 C) 98 F (36.7 C) 97.9 F (36.6 C)  TempSrc:  Oral Oral Oral  SpO2:  100% 100% 100%  Weight: 50.1 kg     Height:        Intake/Output Summary (Last 24 hours) at 06/12/2023 0827 Last data filed at 06/12/2023 2841 Gross per 24 hour  Intake 1962.08 ml  Output 2875 ml  Net -912.92 ml   Filed Weights   06/10/23 0200 06/11/23 0457  Weight: 49.2 kg 50.1 kg    Examination:  General: No acute distress.  Remains on room air.  Chronically ill and deconditioned looking. ENT/neck: No palpable neck masses or JVD elevation noted  respiratory: Bilateral decreased breath sounds at bases with scattered crackles CVS: Rate currently controlled; S1 and S2 are heard Abdominal: Soft, slightly tender; distended mildly; no organomegaly, normal bowel sounds are heard.  Ileostomy bag present with liquid stool. Genitourinary: Right nephrostomy tube is present  extremities: No clubbing; mild lower extremity edema present  CNS: Alert.  Poor historian.  Slow to respond.  No obvious focal deficits noted lymph: No obvious palpable lymphadenopathy Skin: No obvious petechiae/rashes  psych: Currently not agitated.  Affect is flat  musculoskeletal: No obvious joint tenderness/deformity   Data Reviewed: I have personally reviewed following labs and imaging studies  CBC: Recent Labs  Lab 06/09/23 2031 06/10/23 0419 06/11/23 0340 06/12/23 0301  WBC 9.1 7.3 6.5 7.3  NEUTROABS 8.3*  --  4.9 4.2  HGB 12.9 12.2 10.7* 10.1*  HCT 36.0 36.1 31.6* 29.8*  MCV 82.9  87.8 88.8 88.7  PLT 284 217 195 217   Basic Metabolic Panel: Recent Labs  Lab 06/09/23 2031 06/09/23 2220 06/10/23 0418 06/10/23 0419 06/10/23 1524 06/11/23 0340 06/12/23 0301  NA 121* 123*  --  128* 130* 127* 128*  K 4.8  --   --  4.5 4.5 4.6 4.1  CL 88*  --   --  97* 101 102 105  CO2 18*  --   --  19* 19* 20* 16*  GLUCOSE 270*  --   --  118* 196* 132* 84  BUN 128*  --   --  114* 95* 87* 68*  CREATININE 2.07*  --   --  1.81* 1.44* 1.26* 0.94  CALCIUM 10.1  --   --  9.6 9.0 9.1 9.0  MG  --   --  2.5*  --   --  2.3 2.0  PHOS  --   --  4.0  --   --  2.7 2.4*   GFR: Estimated Creatinine Clearance: 45.9 mL/min (by C-G formula based on SCr of 0.94 mg/dL). Liver Function Tests: Recent Labs  Lab 06/09/23 2031 06/10/23 0419 06/11/23 0340 06/12/23 0301  AST 49* 50* 50* 112*  ALT 125* 109* 105* 153*  ALKPHOS 308* 269* 245* 258*  BILITOT 0.5 0.6 0.4 0.3  PROT 8.4* 7.0 6.5 6.4*  ALBUMIN 3.6 3.2* 3.0* 2.9*   Recent Labs  Lab 06/09/23 2031  LIPASE 48   No results for input(s): "AMMONIA" in the last 168 hours. Coagulation Profile: No  results for input(s): "INR", "PROTIME" in the last 168 hours. Cardiac Enzymes: No results for input(s): "CKTOTAL", "CKMB", "CKMBINDEX", "TROPONINI" in the last 168 hours. BNP (last 3 results) No results for input(s): "PROBNP" in the last 8760 hours. HbA1C: No results for input(s): "HGBA1C" in the last 72 hours. CBG: Recent Labs  Lab 06/11/23 0604 06/11/23 1201 06/11/23 1750 06/11/23 2344 06/12/23 0602  GLUCAP 150* 194* 123* 163* 115*   Lipid Profile: Recent Labs    06/11/23 0340 06/12/23 0301  TRIG 191* 166*   Thyroid Function Tests: Recent Labs    06/10/23 0419  TSH 0.382   Anemia Panel: Recent Labs    06/10/23 0419  VITAMINB12 601  FOLATE 8.9   Sepsis Labs: No results for input(s): "PROCALCITON", "LATICACIDVEN" in the last 168 hours.  Recent Results (from the past 240 hours)  Resp panel by RT-PCR (RSV, Flu A&B,  Covid) Anterior Nasal Swab     Status: None   Collection Time: 06/09/23  8:47 PM   Specimen: Anterior Nasal Swab  Result Value Ref Range Status   SARS Coronavirus 2 by RT PCR NEGATIVE NEGATIVE Final    Comment: (NOTE) SARS-CoV-2 target nucleic acids are NOT DETECTED.  The SARS-CoV-2 RNA is generally detectable in upper respiratory specimens during the acute phase of infection. The lowest concentration of SARS-CoV-2 viral copies this assay can detect is 138 copies/mL. A negative result does not preclude SARS-Cov-2 infection and should not be used as the sole basis for treatment or other patient management decisions. A negative result may occur with  improper specimen collection/handling, submission of specimen other than nasopharyngeal swab, presence of viral mutation(s) within the areas targeted by this assay, and inadequate number of viral copies(<138 copies/mL). A negative result must be combined with clinical observations, patient history, and epidemiological information. The expected result is Negative.  Fact Sheet for Patients:  BloggerCourse.com  Fact Sheet for Healthcare Providers:  SeriousBroker.it  This test is no t yet approved or cleared by the [place] FDA and  has been authorized for detection and/or diagnosis of SARS-CoV-2 by FDA under an Emergency Use Authorization (EUA). This EUA will remain  in effect (meaning this test can be used) for the duration of the COVID-19 declaration under Section 564(b)(1) of the Act, 21 U.S.C.section 360bbb-3(b)(1), unless the authorization is terminated  or revoked sooner.       Influenza A by PCR NEGATIVE NEGATIVE Final   Influenza B by PCR NEGATIVE NEGATIVE Final    Comment: (NOTE) The Xpert Xpress SARS-CoV-2/FLU/RSV plus assay is intended as an aid in the diagnosis of influenza from Nasopharyngeal swab specimens and should not be used as a sole basis for treatment. Nasal  washings and aspirates are unacceptable for Xpert Xpress SARS-CoV-2/FLU/RSV testing.  Fact Sheet for Patients: BloggerCourse.com  Fact Sheet for Healthcare Providers: SeriousBroker.it  This test is not yet approved or cleared by the [place] FDA and has been authorized for detection and/or diagnosis of SARS-CoV-2 by FDA under an Emergency Use Authorization (EUA). This EUA will remain in effect (meaning this test can be used) for the duration of the COVID-19 declaration under Section 564(b)(1) of the Act, 21 U.S.C. section 360bbb-3(b)(1), unless the authorization is terminated or revoked.     Resp Syncytial Virus by PCR NEGATIVE NEGATIVE Final    Comment: (NOTE) Fact Sheet for Patients: BloggerCourse.com  Fact Sheet for Healthcare Providers: SeriousBroker.it  This test is not yet approved or cleared by the [place] and has been authorized  for detection and/or diagnosis of SARS-CoV-2 by FDA under an Emergency Use Authorization (EUA). This EUA will remain in effect (meaning this test can be used) for the duration of the COVID-19 declaration under Section 564(b)(1) of the Act, 21 U.S.C. section 360bbb-3(b)(1), unless the authorization is terminated or revoked.  Performed at [facility], [address] [place], [place] [address]   MRSA Next Gen by PCR, Nasal     Status: Abnormal   Collection Time: 06/10/23  2:30 AM   Specimen: Nasal Mucosa; Nasal Swab  Result Value Ref Range Status   MRSA by PCR Next Gen DETECTED (A) NOT DETECTED Final    Comment: (NOTE) The GeneXpert MRSA Assay (FDA approved for NASAL specimens only), is one component of a comprehensive MRSA colonization surveillance program. It is not intended to diagnose MRSA infection nor to guide or monitor treatment for MRSA infections. Test performance is not FDA approved in patients  less than 7 years old. Performed at [facility], [address] [place], [place] [address]          Radiology Studies: No results found.       Scheduled Meds:  acetaminophen  1,000 mg Oral Once   Chlorhexidine Gluconate Cloth  6 each Topical Q0600   divalproex  125 mg Oral QHS   enoxaparin (LOVENOX) injection  40 mg Subcutaneous Q24H   feeding supplement  237 mL Oral BID BM   insulin aspart  0-9 Units Subcutaneous Q6H   levothyroxine  25 mcg Oral Q0600   mirtazapine  7.5 mg Oral QHS   mupirocin ointment  1 Application Nasal BID   pantoprazole  40 mg Oral Daily   sertraline  25 mg Oral Daily   sodium chloride flush  10-40 mL Intracatheter Q12H   thiamine  100 mg Oral Daily   Continuous Infusions:  sodium chloride 75 mL/hr at 06/12/23 0235   cefTRIAXone (ROCEPHIN)  IV 1 g (06/11/23 1308)   TPN (CLINIMIX-E) Adult 42 mL/hr at 06/11/23 1742          Glade Lloyd, MD [facility] 06/12/2023, 8:27 AM

## 2023-06-13 ENCOUNTER — Ambulatory Visit: Payer: Medicare Other | Admitting: Nurse Practitioner

## 2023-06-13 DIAGNOSIS — E871 Hypo-osmolality and hyponatremia: Secondary | ICD-10-CM | POA: Diagnosis not present

## 2023-06-13 LAB — CBC WITH DIFFERENTIAL/PLATELET
Abs Immature Granulocytes: 0.04 10*3/uL (ref 0.00–0.07)
Basophils Absolute: 0 10*3/uL (ref 0.0–0.1)
Basophils Relative: 1 %
Eosinophils Absolute: 0.3 10*3/uL (ref 0.0–0.5)
Eosinophils Relative: 4 %
HCT: 29.4 % — ABNORMAL LOW (ref 36.0–46.0)
Hemoglobin: 10.2 g/dL — ABNORMAL LOW (ref 12.0–15.0)
Immature Granulocytes: 1 %
Lymphocytes Relative: 16 %
Lymphs Abs: 0.9 10*3/uL (ref 0.7–4.0)
MCH: 30.4 pg (ref 26.0–34.0)
MCHC: 34.7 g/dL (ref 30.0–36.0)
MCV: 87.8 fL (ref 80.0–100.0)
Monocytes Absolute: 0.4 10*3/uL (ref 0.1–1.0)
Monocytes Relative: 7 %
Neutro Abs: 4.2 10*3/uL (ref 1.7–7.7)
Neutrophils Relative %: 71 %
Platelets: 202 10*3/uL (ref 150–400)
RBC: 3.35 MIL/uL — ABNORMAL LOW (ref 3.87–5.11)
RDW: 15 % (ref 11.5–15.5)
WBC: 5.9 10*3/uL (ref 4.0–10.5)
nRBC: 0 % (ref 0.0–0.2)

## 2023-06-13 LAB — COMPREHENSIVE METABOLIC PANEL
ALT: 259 U/L — ABNORMAL HIGH (ref 0–44)
AST: 162 U/L — ABNORMAL HIGH (ref 15–41)
Albumin: 2.8 g/dL — ABNORMAL LOW (ref 3.5–5.0)
Alkaline Phosphatase: 296 U/L — ABNORMAL HIGH (ref 38–126)
Anion gap: 9 (ref 5–15)
BUN: 60 mg/dL — ABNORMAL HIGH (ref 8–23)
CO2: 19 mmol/L — ABNORMAL LOW (ref 22–32)
Calcium: 9.1 mg/dL (ref 8.9–10.3)
Chloride: 102 mmol/L (ref 98–111)
Creatinine, Ser: 0.88 mg/dL (ref 0.44–1.00)
GFR, Estimated: >60 mL/min (ref >60)
Glucose, Bld: 136 mg/dL — ABNORMAL HIGH (ref 70–99)
Potassium: 3.6 mmol/L (ref 3.5–5.1)
Sodium: 130 mmol/L — ABNORMAL LOW (ref 135–145)
Total Bilirubin: 0.5 mg/dL (ref <1.2)
Total Protein: 6.5 g/dL (ref 6.5–8.1)

## 2023-06-13 LAB — GLUCOSE, CAPILLARY
Glucose-Capillary: 183 mg/dL — ABNORMAL HIGH (ref 70–99)
Glucose-Capillary: 200 mg/dL — ABNORMAL HIGH (ref 70–99)
Glucose-Capillary: 132 mg/dL — ABNORMAL HIGH (ref 70–99)
Glucose-Capillary: 140 mg/dL — ABNORMAL HIGH (ref 70–99)

## 2023-06-13 LAB — PHOSPHORUS: Phosphorus: 3.2 mg/dL (ref 2.5–4.6)

## 2023-06-13 LAB — MAGNESIUM: Magnesium: 1.7 mg/dL (ref 1.7–2.4)

## 2023-06-13 MED ORDER — ASPIRIN-ACETAMINOPHEN-CAFFEINE 250-250-65 MG PO TABS
1.0000 | ORAL_TABLET | Freq: Once | ORAL | Status: AC
  Administered 2023-06-13: 1 via ORAL
  Filled 2023-06-13: qty 1

## 2023-06-13 MED ORDER — TRACE MINERALS CU-MN-SE-ZN 300-55-60-3000 MCG/ML IV SOLN
INTRAVENOUS | Status: AC
  Filled 2023-06-13 (×2): qty 1000

## 2023-06-13 MED ORDER — HALOPERIDOL LACTATE 5 MG/ML IJ SOLN
1.0000 mg | Freq: Four times a day (QID) | INTRAMUSCULAR | Status: DC | PRN
  Administered 2023-06-13 – 2023-06-24 (×7): 1 mg via INTRAVENOUS
  Filled 2023-06-13 (×7): qty 1

## 2023-06-13 MED ORDER — MAGNESIUM SULFATE 2 GM/50ML IV SOLN
2.0000 g | Freq: Once | INTRAVENOUS | Status: AC
  Administered 2023-06-13: 2 g via INTRAVENOUS
  Filled 2023-06-13: qty 50

## 2023-06-13 MED ORDER — LORAZEPAM 2 MG/ML IJ SOLN
0.5000 mg | Freq: Four times a day (QID) | INTRAMUSCULAR | Status: DC | PRN
  Administered 2023-06-13 (×2): 0.5 mg via INTRAVENOUS
  Filled 2023-06-13 (×2): qty 1

## 2023-06-13 MED ORDER — FAT EMUL FISH OIL/PLANT BASED 20% (SMOFLIPID)IV EMUL
250.0000 mL | INTRAVENOUS | Status: AC
  Administered 2023-06-13: 250 mL via INTRAVENOUS
  Filled 2023-06-13: qty 250

## 2023-06-13 NOTE — Progress Notes (Signed)
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   Assessment:  67 y/o F with PMH of cervical cancer and rectovaginal fistula s/p redo low anterior resection with colovaginal fistula takedown, fistula repair, and hand sewn end to end sigmoidrectal anastomosis with loop ileostomy on 11/23/18; closure of loop ileostomy 05/01/20, SBO s/p ex-lap with lysis of adhesions on 11/08/20, recurrent SBO s/p ex-lap, LOA, distal ileum and cecum resection with end ileostomy creation on 03/14/2023 started on TPN, right hydronephrosis s/p R ureteral stent placement on 04/02/2023 with subsequent obstructive uropathy, UTI, AKI and bilateral hydronephrosis with placement of right nephrostomy tube on 05/08/2023 who presents with nausea/vomiting and hyponatremia. Pharmacy consulted to resume TPN on admission.   Glucose / Insulin:  No history of DM.  - on sSSI (used 4 units in the past 24 hrs) - CBGs last 24hr (goal <180): 115, 125, 164, 172, 153, 183 Electrolytes: Na 130 up, CO2 19 up, phos 3.2 up, mag 1.7 Renal: AKI on CKD stage IIIa - SCr trending down with 0.88. BUN elevated but improved to 60 Hepatic: AST/ALT and  Alk Phos elevated and continue to trend up - monitor closely - Tbili WNL.  - TG slightly elevated at 166 but improved - Albumin low 2.8 Intake / Output; MIVF: I/O -2179 mL. MIVF changed to NaBicarb infusion at 61ml/hr on 12/26 and NS d/c'd - UOP: - Stool output: GI Imaging:  - 12/23 CT abdomen and pelvis: did not show SBO but showed moderate to marked severity right-sided hydronephrosis and proximal hydroureter with percutaneous right-sided nephrostomy tube and right sided intraureteral stent in place along with mild to moderate  severity left-sided hydronephrosis and proximal hydroureter with markedly dilated intrarenal calyces on the left.  GI Surgeries / Procedures: see above  Central access: PICC line in place TPN start date: continued from PTA  Nutritional Goals: Due to the Baxter IV fluid disruption, all adult parenteral nutrition will utilize premade Clinimix products +/- fat emulsion infusion. Our goal will be to continue providing as close to 100% of our patient's nutritional needs, but due to limited TPN Clinimix concentrations we will meet at least 80% of protein and 75% of kcal goals.  Per discussion with [employer] [facility] [employer], patient was receiving Clinimix E 5/15 1000 mL over 24 hours and Intralipid 20% 250 mL over 12 hours while at facility   -Goal TPN rate (Clinimix 8/10) is 42 mL/hr (provides 80g of protein and 1164 kcals per day) - meets 100% of protein goal and 78% of kcal goals with TPN alone  -Also, each Ensure Plus supplement (ordered BID) provides 350 kcal and 20g of protein     RD Assessment: Estimated Needs Total Energy Estimated Needs: 1500-1700 Total Protein Estimated Needs: 75-90g Total Fluid Estimated Needs: 1.7L/day  Current Nutrition:  Regular diet Ensure Plus BID TPN to resume this PM  Plan:  Now:  - Mag Sulfate 2g IV x 1  At 1800: - Continue Clinimix E 8/10 at 27mL/hr at 1800 - Electrolytes in TPN (fixed; unable to adjust in premixed Clinimix): Na 39mEq/L, K 19mEq/L, Ca 69mEq/L, Mg 68mEq/L, Phos 91mmol/L, Acetate 83 mEq/L, Cl 76 mEq/L - Add standard MVI and trace elements to TPN - SMOFlipid 20%  IV over 12 hours. Will monitor TG closely and may hold if TG increases significantly  - Continue  Sensitive q6h SSI and adjust as needed  - Thiamine 100mg  PO daily per RD recommendations - MIVF management per MD - currently on NaBicarb at 75 ml/hr - Monitor TPN labs on Mon/Thurs and prn   Hessie Knows, PharmD, BCPS Secure Chat if ?s 06/13/2023  11:25 AM

## 2023-06-13 NOTE — Progress Notes (Signed)
Nutrition Follow-up  DOCUMENTATION CODES:      INTERVENTION:  - Plan to continue TPN. Clinimix 8/10 at 74mL/hr + SMOFlipid over 12 hours is meeting 100% of estimated calorie and protein needs.  -TPN management per Pharmacy  - Regular diet.  - Continue Ensure Plus High Protein po BID, each supplement provides 350 kcal and 20 grams of protein as tolerated  - Monitor weight trends.   NUTRITION DIAGNOSIS:   Inadequate oral intake related to nausea, vomiting, altered GI function as evidenced by per patient/family report. *ongoing  GOAL:   Patient will meet greater than or equal to 90% of their needs *met with TPN  MONITOR:   PO intake, Supplement acceptance, Labs, Weight trends, I & O's, Skin (TPN)  REASON FOR ASSESSMENT:   Consult New TPN/TNA  ASSESSMENT:   67 y.o. female with medical history significant of cervical cancer and rectovaginal fistula s/p redo low anterior resection with colovaginal fistula takedown, fistula repair, and hand sewn end to end sigmoidrectal anastomosis with loop ileostomy on 11/23/18; closure of loop ileostomy 05/01/20, SBO s/p ex-lap with lysis of adhesions on 11/08/20, recurrent SBO s/p ex-lap, LOA, distal ileum and cecum resection with end ileostomy creation on 03/14/2023 started on TPN, right hydronephrosis s/p R ureteral stent placement on 04/02/2023 with subsequent obstructive uropathy, UTI, AKI and bilateral hydronephrosis with placement of right nephrostomy tube on 05/08/2023 who presents with nausea and vomiting and hyponatremia.  12/23: Admit 12/24: TPN initiated  Patient sitting at edge of bed at time of visit, husband at bedside.   Patient with flat affect, did not elaborate on answers provided.   Unsure of a UBW but reports she has lost and gained weight but not recently.  Per EMR, patient weighing consistently between 124-129# from February 2024 to July 2023.  She was last weighed at 125# in July and dropped to 106# by November.  This is a 19# or 15% weight loss in 4 months, which is significant and severe for the time frame. Patient feels this could be accurate. Weight stable since November.  She acknowledges being on home TPN PTA. Notes she was not eating anything substantial PTA.  Eating a little this admission. States she had soda and some cookies. Only 1 meal documented since admission, 25% of lunch on 12/24. Also drinking Ensure twice daily.   She remains on TPN, continues to infuse at goal of 57mL/hr. Clinimix and SMOFlipids providing 100% of estimated needs.  Recommend patient remain on TPN given variable intake with chronic N/V and history of extensive abdominal surgeries resulting in likely malabsorption.  Patient declined NFPE at end of visit. Reports she "may say something offensive" and would like to defer to next follow up visit.   Admit weight: 108# Current weight: 110#  I&O's: -1.4L  Medications reviewed and include: Remeron, 100mg  thiamine  Labs reviewed:  Na 130 Triglycerides 166 (as of 12/26)   NUTRITION - FOCUSED PHYSICAL EXAM:  Patient declined exam, will attempt at next follow up  Diet Order:   Diet Order             Diet regular Room service appropriate? Yes; Fluid consistency: Thin  Diet effective now                   EDUCATION NEEDS:  No education needs have been identified at this time  Skin:  Skin Assessment: Skin Integrity Issues: Skin Integrity Issues:: Stage II Stage II: mid sacrum  Last BM:  12/24 -ileostomy  Height:  Ht Readings from Last 1 Encounters:  06/10/23 5\' 2"  (1.575 m)   Weight:  Wt Readings from Last 1 Encounters:  06/13/23 50.2 kg    BMI:  Body mass index is 20.24 kg/m.  Estimated Nutritional Needs:  Kcal:  1500-1700 Protein:  75-90g Fluid:  1.7L/day    Shelle Iron RD, LDN Contact via Secure Chat.

## 2023-06-13 NOTE — Progress Notes (Signed)
PROGRESS NOTE    [name]  ZHY:[id number] DOB: 1956-02-07 DOA: 06/09/2023 PCP: Patient, [name]   Brief Narrative:  67 y.o. female with medical history significant of cervical cancer and rectovaginal fistula s/p redo low anterior resection with colovaginal fistula takedown, fistula repair, and hand sewn end to end sigmoidrectal anastomosis with loop ileostomy on 11/23/18; closure of loop ileostomy 05/01/20, SBO s/p ex-lap with lysis of adhesions on 11/08/20, recurrent SBO s/p ex-lap, LOA, distal ileum and cecum resection with end ileostomy creation on 03/14/2023 started on TPN, right hydronephrosis s/p R ureteral stent placement on 04/02/2023 with subsequent obstructive uropathy, UTI, AKI and bilateral hydronephrosis with placement of right nephrostomy tube on 05/08/2023 presented with nausea, vomiting and hyponatremia from SNF.  Patient has been on TPN recently after her recent hospitalization at [facility] and discharged to SNF.  On presentation, she was tachypneic with sodium of 121 (last sodium on 05/24/2023 in care everywhere was 130).  Creatinine was 2.07 (last creatinine was 1.09 on 05/24/2023) with bicarb of 18.  CT of abdomen and pelvis did not show SBO but showed moderate to marked severity right-sided hydronephrosis and proximal hydroureter with percutaneous right-sided nephrostomy tube and right sided intraureteral stent in place along with mild to moderate severity left-sided hydronephrosis and proximal hydroureter with markedly dilated intrarenal calyces on the left.  She was started on IV fluids.  Assessment & Plan:   Hyponatremia -Possibly due to decreased oral intake and likely GI loss. -Sodium 121 on presentation.  Sodium 130 this morning.  IV fluids as below..  Encourage oral intake.  DC'd sertraline.  Acute kidney injury on chronic kidney disease stage IIIa Acute metabolic acidosis Possible UTI: Present on admission History of bilateral hydronephrosis  status post ureteral stent and right nephrostomy tube -Presented with creatinine of 2.07, last creatinine was 1.09 on 05/24/2023.  Creatinine 0.88 this morning.  Monitor.  Bicarb 19 today.  Continue bicarb drip at 50 cc an hour. -Imaging as above.  Discussed with Dr. Elenore Paddy on phone on 06/10/2023: He reviewed prior records, notes and imaging and stated that patient has an almost atrophic/nonfunctional left kidney with possible chronic hydronephrosis and patient would not need any procedure for left hydronephrosis.  As long as right nephrostomy tube is draining and kidney function is improving, patient will need to follow-up with urology as an outpatient. -Husband continues to insist that the nephrostomy tube needs to be removed and he is frustrated that he needs to wait to see urology as an outpatient.  I have subsequently spoken to Dr. MacDiarmid/urology on phone today: He will be seeing the patient in consultation.  Follow urology recommendations. -UA suggestive of possible UTI.  Continue Rocephin.  Urine culture grew multiple species.  Hypothyroidism -Continue levothyroxine  Hyperlipidemia -Hold statin due to transaminitis  Transaminitis -LFTs still mildly elevated.  CT imaging showed no biliary dilatation.  Monitor intermittently.  Nausea and vomiting -Has been an ongoing chronic issue for her due to her complex abdominal surgical history.  No small bowel obstruction demonstrated on CT imaging on presentation.  Continue as needed antiemetics.  Outpatient follow-up with primary surgical team  Failure to thrive in adult --Pt has extension abdominal surgery due to hx of cervical cancer and currently on TPN  -Palliative care evaluation appreciated.  Patient remains full code.  History of cervical cancer and rectovaginal fistula status post surgical intervention -Currently has an ileostomy.  Outpatient follow-up with oncology/GYN oncology/general surgery  Anxiety/depression -Continue  sertraline  and mirtazapine  Physical deconditioning -Patient from SNF.  PT recommending SNF placement.  Patient wants to go to a different SNF.  TOC consulted.   DVT prophylaxis: Lovenox Code Status: Full Family Communication: Husband at bedside  Disposition Plan: Status is: inpatient because: Of severity of illness  Consultants: Urology Procedures: None  Antimicrobials:  Anti-infectives (From admission, onward)    Start     Dose/Rate Route Frequency Ordered Stop   06/11/23 1200  cefTRIAXone (ROCEPHIN) 1 g in sodium chloride 0.9 % 100 mL IVPB        1 g 200 mL/hr over 30 Minutes Intravenous Every 24 hours 06/11/23 1154           Subjective: Patient seen and examined at bedside.  Does not feel well.  Does not want to elaborate on the same.  No agitation, fever or vomiting reported.   Objective: Vitals:   06/12/23 1231 06/12/23 2105 06/13/23 0500 06/13/23 0548  BP: 125/73 116/74  96/69  Pulse: 84 (!) 108  86  Resp: 17 19  17   Temp: (!) 97.5 F (36.4 C) 97.6 F (36.4 C)  97.9 F (36.6 C)  TempSrc: Oral Oral  Oral  SpO2: 100% 100%  100%  Weight:   50.2 kg   Height:        Intake/Output Summary (Last 24 hours) at 06/13/2023 1033 Last data filed at 06/13/2023 0739 Gross per 24 hour  Intake 1050.87 ml  Output 3325 ml  Net -2274.13 ml   Filed Weights   06/10/23 0200 06/11/23 0457 06/13/23 0500  Weight: 49.2 kg 50.1 kg 50.2 kg    Examination:  General: Currently on room air.  No distress.  Chronically ill and deconditioned looking. ENT/neck: No obvious JVD elevation or palpable neck masses noted respiratory: Decreased breath sounds at bases bilaterally with some crackles  CVS: S1-S2 heard; currently rate controlled  abdominal: Soft, slightly tender; slightly distended; no organomegaly, bowel sounds are heard.  Ileostomy bag present with liquid stool. Genitourinary: Has a right nephrostomy tube  extremities: Trace lower extremity edema present; no cyanosis   CNS: Awake.  Poor historian.  Still slow to respond.  No focal deficits noted.  Lymph: No palpable lymphadenopathy  skin: No obvious lesions/ecchymosis psych: Flat affect.  Not agitated currently.   Musculoskeletal: No obvious joint tenderness/swelling   Data Reviewed: I have personally reviewed following labs and imaging studies  CBC: Recent Labs  Lab 06/09/23 2031 06/10/23 0419 06/11/23 0340 06/12/23 0301 06/13/23 0331  WBC 9.1 7.3 6.5 7.3 5.9  NEUTROABS 8.3*  --  4.9 4.2 4.2  HGB 12.9 12.2 10.7* 10.1* 10.2*  HCT 36.0 36.1 31.6* 29.8* 29.4*  MCV 82.9 87.8 88.8 88.7 87.8  PLT 284 217 195 217 202   Basic Metabolic Panel: Recent Labs  Lab 06/10/23 0418 06/10/23 0419 06/10/23 1524 06/11/23 0340 06/12/23 0301 06/13/23 0331  NA  --  128* 130* 127* 128* 130*  K  --  4.5 4.5 4.6 4.1 3.6  CL  --  97* 101 102 105 102  CO2  --  19* 19* 20* 16* 19*  GLUCOSE  --  118* 196* 132* 84 136*  BUN  --  114* 95* 87* 68* 60*  CREATININE  --  1.81* 1.44* 1.26* 0.94 0.88  CALCIUM  --  9.6 9.0 9.1 9.0 9.1  MG 2.5*  --   --  2.3 2.0 1.7  PHOS 4.0  --   --  2.7 2.4* 3.2   GFR: Estimated  Creatinine Clearance: 49.1 mL/min (by C-G formula based on SCr of 0.88 mg/dL). Liver Function Tests: Recent Labs  Lab 06/09/23 2031 06/10/23 0419 06/11/23 0340 06/12/23 0301 06/13/23 0331  AST 49* 50* 50* 112* 162*  ALT 125* 109* 105* 153* 259*  ALKPHOS 308* 269* 245* 258* 296*  BILITOT 0.5 0.6 0.4 0.3 0.5  PROT 8.4* 7.0 6.5 6.4* 6.5  ALBUMIN 3.6 3.2* 3.0* 2.9* 2.8*   Recent Labs  Lab 06/09/23 2031  LIPASE 48   No results for input(s): "AMMONIA" in the last 168 hours. Coagulation Profile: No results for input(s): "INR", "PROTIME" in the last 168 hours. Cardiac Enzymes: No results for input(s): "CKTOTAL", "CKMB", "CKMBINDEX", "TROPONINI" in the last 168 hours. BNP (last 3 results) No results for input(s): "PROBNP" in the last 8760 hours. HbA1C: No results for input(s): "HGBA1C" in the  last 72 hours. CBG: Recent Labs  Lab 06/12/23 1229 06/12/23 1822 06/12/23 2102 06/12/23 2319 06/13/23 0546  GLUCAP 125* 164* 172* 153* 183*   Lipid Profile: Recent Labs    06/11/23 0340 06/12/23 0301  TRIG 191* 166*   Thyroid Function Tests: No results for input(s): "TSH", "T4TOTAL", "FREET4", "T3FREE", "THYROIDAB" in the last 72 hours.  Anemia Panel: No results for input(s): "VITAMINB12", "FOLATE", "FERRITIN", "TIBC", "IRON", "RETICCTPCT" in the last 72 hours.  Sepsis Labs: No results for input(s): "PROCALCITON", "LATICACIDVEN" in the last 168 hours.  Recent Results (from the past 240 hours)  Resp panel by RT-PCR (RSV, Flu A&B, Covid) Anterior Nasal Swab     Status: None   Collection Time: 06/09/23  8:47 PM   Specimen: Anterior Nasal Swab  Result Value Ref Range Status   SARS Coronavirus 2 by RT PCR NEGATIVE NEGATIVE Final    Comment: (NOTE) SARS-CoV-2 target nucleic acids are NOT DETECTED.  The SARS-CoV-2 RNA is generally detectable in upper respiratory specimens during the acute phase of infection. The lowest concentration of SARS-CoV-2 viral copies this assay can detect is 138 copies/mL. A negative result does not preclude SARS-Cov-2 infection and should not be used as the sole basis for treatment or other patient management decisions. A negative result may occur with  improper specimen collection/handling, submission of specimen other than nasopharyngeal swab, presence of viral mutation(s) within the areas targeted by this assay, and inadequate number of viral copies(<138 copies/mL). A negative result must be combined with clinical observations, patient history, and epidemiological information. The expected result is Negative.  Fact Sheet for Patients:  BloggerCourse.com  Fact Sheet for Healthcare Providers:  SeriousBroker.it  This test is no t yet approved or cleared by the [place] FDA and  has been  authorized for detection and/or diagnosis of SARS-CoV-2 by FDA under an Emergency Use Authorization (EUA). This EUA will remain  in effect (meaning this test can be used) for the duration of the COVID-19 declaration under Section 564(b)(1) of the Act, 21 U.S.C.section 360bbb-3(b)(1), unless the authorization is terminated  or revoked sooner.       Influenza A by PCR NEGATIVE NEGATIVE Final   Influenza B by PCR NEGATIVE NEGATIVE Final    Comment: (NOTE) The Xpert Xpress SARS-CoV-2/FLU/RSV plus assay is intended as an aid in the diagnosis of influenza from Nasopharyngeal swab specimens and should not be used as a sole basis for treatment. Nasal washings and aspirates are unacceptable for Xpert Xpress SARS-CoV-2/FLU/RSV testing.  Fact Sheet for Patients: BloggerCourse.com  Fact Sheet for Healthcare Providers: SeriousBroker.it  This test is not yet approved or cleared by the [place]  FDA and has been authorized for detection and/or diagnosis of SARS-CoV-2 by FDA under an Emergency Use Authorization (EUA). This EUA will remain in effect (meaning this test can be used) for the duration of the COVID-19 declaration under Section 564(b)(1) of the Act, 21 U.S.C. section 360bbb-3(b)(1), unless the authorization is terminated or revoked.     Resp Syncytial Virus by PCR NEGATIVE NEGATIVE Final    Comment: (NOTE) Fact Sheet for Patients: BloggerCourse.com  Fact Sheet for Healthcare Providers: SeriousBroker.it  This test is not yet approved or cleared by the [place] FDA and has been authorized for detection and/or diagnosis of SARS-CoV-2 by FDA under an Emergency Use Authorization (EUA). This EUA will remain in effect (meaning this test can be used) for the duration of the COVID-19 declaration under Section 564(b)(1) of the Act, 21 U.S.C. section 360bbb-3(b)(1), unless the  authorization is terminated or revoked.  Performed at [facility], [address], [place], [place] [address]   MRSA Next Gen by PCR, Nasal     Status: Abnormal   Collection Time: 06/10/23  2:30 AM   Specimen: Nasal Mucosa; Nasal Swab  Result Value Ref Range Status   MRSA by PCR Next Gen DETECTED (A) NOT DETECTED Final    Comment: (NOTE) The GeneXpert MRSA Assay (FDA approved for NASAL specimens only), is one component of a comprehensive MRSA colonization surveillance program. It is not intended to diagnose MRSA infection nor to guide or monitor treatment for MRSA infections. Test performance is not FDA approved in patients less than 63 years old. Performed at [facility], [address], [place], [place] [address]   Urine Culture (for pregnant, neutropenic or urologic patients or patients with an indwelling urinary catheter)     Status: Abnormal   Collection Time: 06/10/23  6:00 PM   Specimen: Urine, Bag (ped)  Result Value Ref Range Status   Specimen Description   Final    URINE, BAG PED Performed at [facility], [address] [place], [place] [address]    Special Requests   Final    NONE Performed at [facility], [address], [place], [place] [address]    Culture MULTIPLE SPECIES PRESENT, SUGGEST RECOLLECTION (A)  Final   Report Status 06/12/2023 FINAL  Final         Radiology Studies: No results found.       Scheduled Meds:  acetaminophen  1,000 mg Oral Once   Chlorhexidine Gluconate Cloth  6 each Topical Q0600   divalproex  125 mg Oral QHS   enoxaparin (LOVENOX) injection  40 mg Subcutaneous Q24H   feeding supplement  237 mL Oral BID BM   insulin aspart  0-9 Units Subcutaneous Q6H   levothyroxine  25 mcg Oral Q0600   mirtazapine  7.5 mg Oral QHS   mupirocin ointment  1 Application Nasal BID   pantoprazole  40 mg Oral Daily   sertraline  25 mg Oral Daily   sodium  chloride flush  10-40 mL Intracatheter Q12H   thiamine  100 mg Oral Daily   Continuous Infusions:  cefTRIAXone (ROCEPHIN)  IV 1 g (06/12/23 1315)   sodium bicarbonate 150 mEq in dextrose 5 % 1,150 mL infusion 75 mL/hr at 06/12/23 2324   TPN (CLINIMIX-E) Adult 42 mL/hr at 06/12/23 1810          Glade Lloyd, MD [facility] 06/13/2023, 10:33 AM

## 2023-06-13 NOTE — Consult Note (Signed)
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   Medicine had spoken to Dr. Laverle Patter a few days ago who recommended for patient to continue with nephrostomy tube in the right stent.  Family want to speak to urologist.  She has been on TPN since recent hospitalization in [facility].  Recent CT scan showed moderate to marked right-sided hydronephrosis with percutaneous right nephrostomy tube and right ureteral stent.  She has mild to moderate left-sided hydronephrosis.  She had hyponatremia which normalized.  Serum creatinine normalized from 2.07-1.09.  Dr. Billey Chang felt that likely the left findings were chronic and as long as the patient's renal function improved for the patient to stay with the stent and right nephrostomy tube and she did not need a left-sided stent.  The patient saw my partner approximately 1 year ago in clinic.  With a stent the patient had an percent renal function and the stent was subsequently removed.  Patient not having pain.  She is tolerating stent and nephrostomy tube well.  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   Thyroid disease    Vertigo    Past Surgical History:  Procedure Laterality Date   ABDOMINAL ADHESION SURGERY  11/08/2020   Dr [name] - [facility]   ABDOMINAL HYSTERECTOMY  2003   [facility] - Dr [name] - for cervical cancer   BILE DUCT EXPLORATION  10/22/2003   CHOLECYSTECTOMY OPEN  10/22/2003   Dr Zachery Dakins - CBD repair w T tube   CYSTOSCOPY  2021   ILEOSTOMY  11/23/2018   ILEOSTOMY CLOSURE  04/2020   LOW ANTERIOR BOWEL RESECTION  11/23/2018   LAR/takedown colovaginal fistula/loop ileostomy  - [facility]    Medications: I have reviewed the patient's current medications. Allergies:  Allergies  Allergen Reactions   Barium-Containing Compounds     hives   Percocet [Oxycodone-Acetaminophen] Itching and Other (See Comments)    migraine     Family History  Problem Relation Age of Onset   Heart disease Father    Diabetes Father    Parkinson's disease Neg Hx    Social History:  reports that she has quit smoking. Her smoking use included cigarettes. She has never used smokeless tobacco. She reports that she does not currently use alcohol. She reports that she does not use drugs.  ROS: All systems are reviewed and negative except as noted. Rest negative  Physical Exam:  Vital signs in last 24 hours: Temp:  [97.5 F (36.4 C)-97.9 F (36.6 C)] 97.9 F (36.6 C) (  12/27 0548) Pulse Rate:  [84-108] 86 (12/27 0548) Resp:  [17-19] 17 (12/27 0548) BP: (96-125)/(69-74) 96/69 (12/27 0548) SpO2:  [100 %] 100 % (12/27 0548) Weight:  [50.2 kg] 50.2 kg (12/27 0500)  Cardiovascular: Skin warm; not flushed Respiratory: Breaths quiet; no shortness of breath Abdomen: No masses Neurological: Normal sensation to touch Musculoskeletal: Normal motor function arms and legs Lymphatics: No inguinal  adenopathy Skin: No rashes Genitourinary: No distress  Laboratory Data:  Results for orders placed or performed during the hospital encounter of 06/09/23 (from the past 72 hours)  Basic metabolic panel     Status: Abnormal   Collection Time: 06/10/23  3:24 PM  Result Value Ref Range   Sodium 130 (L) 135 - 145 mmol/L   Potassium 4.5 3.5 - 5.1 mmol/L   Chloride 101 98 - 111 mmol/L   CO2 19 (L) 22 - 32 mmol/L   Glucose, Bld 196 (H) 70 - 99 mg/dL    Comment: Glucose reference range applies only to samples taken after fasting for at least 8 hours.   BUN 95 (H) 8 - 23 mg/dL   Creatinine, Ser 8.41 (H) 0.44 - 1.00 mg/dL   Calcium 9.0 8.9 - 32.4 mg/dL   GFR, Estimated 40 (L) >60 mL/min    Comment: (NOTE) Calculated using the CKD-EPI Creatinine Equation (2021)    Anion gap 10 5 - 15    Comment: Performed at [facility], [address], [place], [place] [address]  Glucose, capillary     Status: Abnormal   Collection Time: 06/10/23  5:45 PM  Result Value Ref Range   Glucose-Capillary 162 (H) 70 - 99 mg/dL    Comment: Glucose reference range applies only to samples taken after fasting for at least 8 hours.  Urine Culture (for pregnant, neutropenic or urologic patients or patients with an indwelling urinary catheter)     Status: Abnormal   Collection Time: 06/10/23  6:00 PM   Specimen: Urine, Bag (ped)  Result Value Ref Range   Specimen Description      URINE, BAG PED Performed at [facility], [address], [place], [place] [address]    Special Requests      NONE Performed at [facility], [address] [place], [place] [address]    Culture MULTIPLE SPECIES PRESENT, SUGGEST RECOLLECTION (A)    Report Status 06/12/2023 FINAL   Urinalysis, Routine w reflex microscopic -Urine, Clean Catch     Status: Abnormal   Collection Time: 06/10/23  6:00 PM  Result Value Ref Range   Color, Urine YELLOW YELLOW   APPearance HAZY (A) CLEAR    Specific Gravity, Urine 1.016 1.005 - 1.030   pH 6.0 5.0 - 8.0   Glucose, UA NEGATIVE NEGATIVE mg/dL   Hgb urine dipstick MODERATE (A) NEGATIVE   Bilirubin Urine NEGATIVE NEGATIVE   Ketones, ur NEGATIVE NEGATIVE mg/dL   Protein, ur 30 (A) NEGATIVE mg/dL   Nitrite POSITIVE (A) NEGATIVE   Leukocytes,Ua LARGE (A) NEGATIVE   RBC / HPF 0-5 0 - 5 RBC/hpf   WBC, UA >50 0 - 5 WBC/hpf   Bacteria, UA MANY (A) NONE SEEN   Squamous Epithelial / HPF 0-5 0 - 5 /HPF   Mucus PRESENT     Comment: Performed at [facility], [address] [place], [place] [address]  Glucose, capillary     Status: Abnormal   Collection Time: 06/11/23 12:01 AM  Result Value Ref Range  Glucose-Capillary 148 (H) 70 - 99 mg/dL    Comment: Glucose reference range applies only to samples taken after fasting for at least 8 hours.  Magnesium     Status: None   Collection Time: 06/11/23  3:40 AM  Result Value Ref Range   Magnesium 2.3 1.7 - 2.4 mg/dL    Comment: Performed at [facility], [address], [place], [place] [address]  Phosphorus     Status: None   Collection Time: 06/11/23  3:40 AM  Result Value Ref Range   Phosphorus 2.7 2.5 - 4.6 mg/dL    Comment: Performed at [facility], [address], [place], [place] [address]  Comprehensive metabolic panel     Status: Abnormal   Collection Time: 06/11/23  3:40 AM  Result Value Ref Range   Sodium 127 (L) 135 - 145 mmol/L   Potassium 4.6 3.5 - 5.1 mmol/L   Chloride 102 98 - 111 mmol/L   CO2 20 (L) 22 - 32 mmol/L   Glucose, Bld 132 (H) 70 - 99 mg/dL    Comment: Glucose reference range applies only to samples taken after fasting for at least 8 hours.   BUN 87 (H) 8 - 23 mg/dL   Creatinine, Ser 1.91 (H) 0.44 - 1.00 mg/dL   Calcium 9.1 8.9 - 47.8 mg/dL   Total Protein 6.5 6.5 - 8.1 g/dL   Albumin 3.0 (L) 3.5 - 5.0 g/dL   AST 50 (H) 15 - 41 U/L   ALT 105 (H) 0 - 44 U/L   Alkaline Phosphatase 245 (H) 38 -  126 U/L   Total Bilirubin 0.4 <1.2 mg/dL   GFR, Estimated 47 (L) >60 mL/min    Comment: (NOTE) Calculated using the CKD-EPI Creatinine Equation (2021)    Anion gap 5 5 - 15    Comment: Performed at [facility], [address], [place], [place] [address]  Triglycerides     Status: Abnormal   Collection Time: 06/11/23  3:40 AM  Result Value Ref Range   Triglycerides 191 (H) <150 mg/dL    Comment: Performed at [facility], [address] [place], [place] [address]  CBC with Differential/Platelet     Status: Abnormal   Collection Time: 06/11/23  3:40 AM  Result Value Ref Range   WBC 6.5 4.0 - 10.5 K/uL   RBC 3.56 (L) 3.87 - 5.11 MIL/uL   Hemoglobin 10.7 (L) 12.0 - 15.0 g/dL   HCT 57.8 (L) 46.9 - 62.9 %   MCV 88.8 80.0 - 100.0 fL   MCH 30.1 26.0 - 34.0 pg   MCHC 33.9 30.0 - 36.0 g/dL   RDW 52.8 41.3 - 24.4 %   Platelets 195 150 - 400 K/uL   nRBC 0.0 0.0 - 0.2 %   Neutrophils Relative % 75 %   Neutro Abs 4.9 1.7 - 7.7 K/uL   Lymphocytes Relative 14 %   Lymphs Abs 0.9 0.7 - 4.0 K/uL   Monocytes Relative 8 %   Monocytes Absolute 0.6 0.1 - 1.0 K/uL   Eosinophils Relative 1 %   Eosinophils Absolute 0.1 0.0 - 0.5 K/uL   Basophils Relative 1 %   Basophils Absolute 0.0 0.0 - 0.1 K/uL   Immature Granulocytes 1 %   Abs Immature Granulocytes 0.04 0.00 - 0.07 K/uL    Comment: Performed at [facility], [address] [place], [place] [address]  Glucose, capillary     Status: Abnormal  Collection Time: 06/11/23  6:04 AM  Result Value Ref Range   Glucose-Capillary 150 (H) 70 - 99 mg/dL    Comment: Glucose reference range applies only to samples taken after fasting for at least 8 hours.  Glucose, capillary     Status: Abnormal   Collection Time: 06/11/23 12:01 PM  Result Value Ref Range   Glucose-Capillary 194 (H) 70 - 99 mg/dL    Comment: Glucose reference range applies only to samples taken after fasting for at least 8 hours.   Glucose, capillary     Status: Abnormal   Collection Time: 06/11/23  5:50 PM  Result Value Ref Range   Glucose-Capillary 123 (H) 70 - 99 mg/dL    Comment: Glucose reference range applies only to samples taken after fasting for at least 8 hours.  Glucose, capillary     Status: Abnormal   Collection Time: 06/11/23 11:44 PM  Result Value Ref Range   Glucose-Capillary 163 (H) 70 - 99 mg/dL    Comment: Glucose reference range applies only to samples taken after fasting for at least 8 hours.  Comprehensive metabolic panel     Status: Abnormal   Collection Time: 06/12/23  3:01 AM  Result Value Ref Range   Sodium 128 (L) 135 - 145 mmol/L   Potassium 4.1 3.5 - 5.1 mmol/L   Chloride 105 98 - 111 mmol/L   CO2 16 (L) 22 - 32 mmol/L   Glucose, Bld 84 70 - 99 mg/dL    Comment: Glucose reference range applies only to samples taken after fasting for at least 8 hours.   BUN 68 (H) 8 - 23 mg/dL   Creatinine, Ser 7.82 0.44 - 1.00 mg/dL   Calcium 9.0 8.9 - 95.6 mg/dL   Total Protein 6.4 (L) 6.5 - 8.1 g/dL   Albumin 2.9 (L) 3.5 - 5.0 g/dL   AST 213 (H) 15 - 41 U/L   ALT 153 (H) 0 - 44 U/L   Alkaline Phosphatase 258 (H) 38 - 126 U/L   Total Bilirubin 0.3 <1.2 mg/dL   GFR, Estimated >08 >65 mL/min    Comment: (NOTE) Calculated using the CKD-EPI Creatinine Equation (2021)    Anion gap 7 5 - 15    Comment: Performed at [facility], [address] [place], [place] [address]  Magnesium     Status: None   Collection Time: 06/12/23  3:01 AM  Result Value Ref Range   Magnesium 2.0 1.7 - 2.4 mg/dL    Comment: Performed at [facility], [address], [place], [place] [address]  Phosphorus     Status: Abnormal   Collection Time: 06/12/23  3:01 AM  Result Value Ref Range   Phosphorus 2.4 (L) 2.5 - 4.6 mg/dL    Comment: Performed at [facility], [address], [place], [place] [address]  Triglycerides     Status: Abnormal   Collection Time:  06/12/23  3:01 AM  Result Value Ref Range   Triglycerides 166 (H) <150 mg/dL    Comment: Performed at [facility], [address], [place], [place] [address]  CBC with Differential/Platelet     Status: Abnormal   Collection Time: 06/12/23  3:01 AM  Result Value Ref Range   WBC 7.3 4.0 - 10.5 K/uL   RBC 3.36 (L) 3.87 - 5.11 MIL/uL   Hemoglobin 10.1 (L) 12.0 - 15.0 g/dL   HCT 27.2 (L) 53.6 - 64.4 %   MCV 88.7 80.0 -  100.0 fL   MCH 30.1 26.0 - 34.0 pg   MCHC 33.9 30.0 - 36.0 g/dL   RDW 16.1 09.6 - 04.5 %   Platelets 217 150 - 400 K/uL   nRBC 0.0 0.0 - 0.2 %   Neutrophils Relative % 58 %   Neutro Abs 4.2 1.7 - 7.7 K/uL   Lymphocytes Relative 28 %   Lymphs Abs 2.1 0.7 - 4.0 K/uL   Monocytes Relative 8 %   Monocytes Absolute 0.6 0.1 - 1.0 K/uL   Eosinophils Relative 4 %   Eosinophils Absolute 0.3 0.0 - 0.5 K/uL   Basophils Relative 1 %   Basophils Absolute 0.1 0.0 - 0.1 K/uL   Immature Granulocytes 1 %   Abs Immature Granulocytes 0.07 0.00 - 0.07 K/uL    Comment: Performed at [facility], [address] [place], [place] [address]  Glucose, capillary     Status: Abnormal   Collection Time: 06/12/23  6:02 AM  Result Value Ref Range   Glucose-Capillary 115 (H) 70 - 99 mg/dL    Comment: Glucose reference range applies only to samples taken after fasting for at least 8 hours.  Glucose, capillary     Status: Abnormal   Collection Time: 06/12/23 12:29 PM  Result Value Ref Range   Glucose-Capillary 125 (H) 70 - 99 mg/dL    Comment: Glucose reference range applies only to samples taken after fasting for at least 8 hours.  Glucose, capillary     Status: Abnormal   Collection Time: 06/12/23  6:22 PM  Result Value Ref Range   Glucose-Capillary 164 (H) 70 - 99 mg/dL    Comment: Glucose reference range applies only to samples taken after fasting for at least 8 hours.  Glucose, capillary     Status: Abnormal   Collection Time: 06/12/23  9:02 PM   Result Value Ref Range   Glucose-Capillary 172 (H) 70 - 99 mg/dL    Comment: Glucose reference range applies only to samples taken after fasting for at least 8 hours.  Glucose, capillary     Status: Abnormal   Collection Time: 06/12/23 11:19 PM  Result Value Ref Range   Glucose-Capillary 153 (H) 70 - 99 mg/dL    Comment: Glucose reference range applies only to samples taken after fasting for at least 8 hours.  Comprehensive metabolic panel     Status: Abnormal   Collection Time: 06/13/23  3:31 AM  Result Value Ref Range   Sodium 130 (L) 135 - 145 mmol/L   Potassium 3.6 3.5 - 5.1 mmol/L   Chloride 102 98 - 111 mmol/L   CO2 19 (L) 22 - 32 mmol/L   Glucose, Bld 136 (H) 70 - 99 mg/dL    Comment: Glucose reference range applies only to samples taken after fasting for at least 8 hours.   BUN 60 (H) 8 - 23 mg/dL   Creatinine, Ser 1.91 0.44 - 1.00 mg/dL   Calcium 9.1 8.9 - 47.8 mg/dL   Total Protein 6.5 6.5 - 8.1 g/dL   Albumin 2.8 (L) 3.5 - 5.0 g/dL   AST 295 (H) 15 - 41 U/L   ALT 259 (H) 0 - 44 U/L   Alkaline Phosphatase 296 (H) 38 - 126 U/L   Total Bilirubin 0.5 <1.2 mg/dL   GFR, Estimated >62 >13 mL/min    Comment: (NOTE) Calculated using the CKD-EPI Creatinine Equation (2021)    Anion gap 9 5 - 15    Comment: Performed at [facility], [address], [place], [place] [address]  Magnesium     Status: None   Collection Time: 06/13/23  3:31 AM  Result Value Ref Range   Magnesium 1.7 1.7 - 2.4 mg/dL    Comment: Performed at [facility], [address] [place], [place] [address]  CBC with Differential/Platelet     Status: Abnormal   Collection Time: 06/13/23  3:31 AM  Result Value Ref Range   WBC 5.9 4.0 - 10.5 K/uL   RBC 3.35 (L) 3.87 - 5.11 MIL/uL   Hemoglobin 10.2 (L) 12.0 - 15.0 g/dL   HCT 09.8 (L) 11.9 - 14.7 %   MCV 87.8 80.0 - 100.0 fL   MCH 30.4 26.0 - 34.0 pg   MCHC 34.7 30.0 - 36.0 g/dL   RDW 82.9 56.2 - 13.0 %   Platelets  202 150 - 400 K/uL   nRBC 0.0 0.0 - 0.2 %   Neutrophils Relative % 71 %   Neutro Abs 4.2 1.7 - 7.7 K/uL   Lymphocytes Relative 16 %   Lymphs Abs 0.9 0.7 - 4.0 K/uL   Monocytes Relative 7 %   Monocytes Absolute 0.4 0.1 - 1.0 K/uL   Eosinophils Relative 4 %   Eosinophils Absolute 0.3 0.0 - 0.5 K/uL   Basophils Relative 1 %   Basophils Absolute 0.0 0.0 - 0.1 K/uL   Immature Granulocytes 1 %   Abs Immature Granulocytes 0.04 0.00 - 0.07 K/uL    Comment: Performed at [facility], [address], [place], [place] [address]  Phosphorus     Status: None   Collection Time: 06/13/23  3:31 AM  Result Value Ref Range   Phosphorus 3.2 2.5 - 4.6 mg/dL    Comment: Performed at [facility], [address], [place], [place] [address]  Glucose, capillary     Status: Abnormal   Collection Time: 06/13/23  5:46 AM  Result Value Ref Range   Glucose-Capillary 183 (H) 70 - 99 mg/dL    Comment: Glucose reference range applies only to samples taken after fasting for at least 8 hours.   Recent Results (from the past 240 hours)  Resp panel by RT-PCR (RSV, Flu A&B, Covid) Anterior Nasal Swab     Status: None   Collection Time: 06/09/23  8:47 PM   Specimen: Anterior Nasal Swab  Result Value Ref Range Status   SARS Coronavirus 2 by RT PCR NEGATIVE NEGATIVE Final    Comment: (NOTE) SARS-CoV-2 target nucleic acids are NOT DETECTED.  The SARS-CoV-2 RNA is generally detectable in upper respiratory specimens during the acute phase of infection. The lowest concentration of SARS-CoV-2 viral copies this assay can detect is 138 copies/mL. A negative result does not preclude SARS-Cov-2 infection and should not be used as the sole basis for treatment or other patient management decisions. A negative result may occur with  improper specimen collection/handling, submission of specimen other than nasopharyngeal swab, presence of viral mutation(s) within the areas targeted by this  assay, and inadequate number of viral copies(<138 copies/mL). A negative result must be combined with clinical observations, patient history, and epidemiological information. The expected result is Negative.  Fact Sheet for Patients:  BloggerCourse.com  Fact Sheet for Healthcare Providers:  SeriousBroker.it  This test is no t yet approved or cleared by the [place] FDA and  has been authorized for detection and/or diagnosis of SARS-CoV-2 by FDA under an Emergency Use Authorization (EUA). This EUA will  remain  in effect (meaning this test can be used) for the duration of the COVID-19 declaration under Section 564(b)(1) of the Act, 21 U.S.C.section 360bbb-3(b)(1), unless the authorization is terminated  or revoked sooner.       Influenza A by PCR NEGATIVE NEGATIVE Final   Influenza B by PCR NEGATIVE NEGATIVE Final    Comment: (NOTE) The Xpert Xpress SARS-CoV-2/FLU/RSV plus assay is intended as an aid in the diagnosis of influenza from Nasopharyngeal swab specimens and should not be used as a sole basis for treatment. Nasal washings and aspirates are unacceptable for Xpert Xpress SARS-CoV-2/FLU/RSV testing.  Fact Sheet for Patients: BloggerCourse.com  Fact Sheet for Healthcare Providers: SeriousBroker.it  This test is not yet approved or cleared by the [place] FDA and has been authorized for detection and/or diagnosis of SARS-CoV-2 by FDA under an Emergency Use Authorization (EUA). This EUA will remain in effect (meaning this test can be used) for the duration of the COVID-19 declaration under Section 564(b)(1) of the Act, 21 U.S.C. section 360bbb-3(b)(1), unless the authorization is terminated or revoked.     Resp Syncytial Virus by PCR NEGATIVE NEGATIVE Final    Comment: (NOTE) Fact Sheet for Patients: BloggerCourse.com  Fact Sheet for  Healthcare Providers: SeriousBroker.it  This test is not yet approved or cleared by the [place] FDA and has been authorized for detection and/or diagnosis of SARS-CoV-2 by FDA under an Emergency Use Authorization (EUA). This EUA will remain in effect (meaning this test can be used) for the duration of the COVID-19 declaration under Section 564(b)(1) of the Act, 21 U.S.C. section 360bbb-3(b)(1), unless the authorization is terminated or revoked.  Performed at [facility], [address] [place], [place] [address]   MRSA Next Gen by PCR, Nasal     Status: Abnormal   Collection Time: 06/10/23  2:30 AM   Specimen: Nasal Mucosa; Nasal Swab  Result Value Ref Range Status   MRSA by PCR Next Gen DETECTED (A) NOT DETECTED Final    Comment: (NOTE) The GeneXpert MRSA Assay (FDA approved for NASAL specimens only), is one component of a comprehensive MRSA colonization surveillance program. It is not intended to diagnose MRSA infection nor to guide or monitor treatment for MRSA infections. Test performance is not FDA approved in patients less than 43 years old. Performed at [facility], [address] [place], [place] [address]   Urine Culture (for pregnant, neutropenic or urologic patients or patients with an indwelling urinary catheter)     Status: Abnormal   Collection Time: 06/10/23  6:00 PM   Specimen: Urine, Bag (ped)  Result Value Ref Range Status   Specimen Description   Final    URINE, BAG PED Performed at [facility], [address] [place], [place] [address]    Special Requests   Final    NONE Performed at [facility], [address], [place], [place] [address]    Culture MULTIPLE SPECIES PRESENT, SUGGEST RECOLLECTION (A)  Final   Report Status 06/12/2023 FINAL  Final   Creatinine: Recent Labs    06/09/23 2031 06/10/23 0419 06/10/23 1524 06/11/23 0340  06/12/23 0301 06/13/23 0331  CREATININE 2.07* 1.81* 1.44* 1.26* 0.94 0.88    Xrays: See report/chart Reviewed  Impression/Assessment:  Picture drawn.  Patient understands he has a right nephrostomy tube and right ureteral stent.  More than 90% of kidney function is from the right.  In my opinion she may go and see  her urologist as an outpatient and they do have an appointment as of this morning in [facility].  Importance of continuity of care discussed.  They agree with this.  Hopefully they can at least remove the nephrostomy tube.  She understands that if she does need a chronic stent that it could be changed perhaps twice a year approximately every 6 months.  I have asked them to place thicker gauze over her right side like a pillow if she is irritated by the sutures.  Plan:  As noted above May discharge home when medically stable to follow-up in [facility] A Josue Kass 06/13/2023, 12:25 PM

## 2023-06-13 NOTE — Progress Notes (Signed)
Physical Therapy Treatment Patient Details Name: [name] MRN: [id number] DOB: 01-18-1956 Today's Date: 06/13/2023   History of Present Illness Pt is a 67 year old woman admitted 12/23 from [facility], where she was receiving rehab, with hyponatremia and AKI associated with nausea and vomiting. PMH: cervical cancer, colovaginal fistula, multiple abdominal surgeries, recent nephrostomy due to hydronephrosis, CKD, anxiety, headaches, UTI.    PT Comments  General Comments: Pt was AxO x2 easily distracted and masks her confusion with anger.  Required repaet functional VC's.  Pt would ask the same questions.  Poor memory.  Poor recall. Assisted OOB to amb to bathroom then in hallway.  General transfer comment: required Min Assist for instability and repeat functional VC's to complete task and perform safely.  Pt impulsive and quick to sit without requards to IV line/walker placement. General Gait Details: VERY unsteady gait using Rollator walker with seat as pt fatigues quickly she can sit and take a break on walker.  Required Max VC's on proper use, brakes and instructed repeatably to park walker up against a wall, lock brakes, turn around  prior to sitting. Assisted back to bed per pt request to rest. Pt will need ST Rehab at SNF to address mobility and functional decline prior to safely returning home.    If plan is discharge home, recommend the following: Two people to help with walking and/or transfers;A little help with bathing/dressing/bathroom;Assistance with cooking/housework;Assist for transportation;Help with stairs or ramp for entrance   Can travel by private vehicle     No  Equipment Recommendations  None recommended by PT    Recommendations for Other Services       Precautions / Restrictions Precautions Precautions: Fall Precaution Comments: TPN, ileostomy, R nephrostomy drain Restrictions Weight Bearing Restrictions Per Provider Order: No     Mobility  Bed  Mobility Overal bed mobility: Needs Assistance Bed Mobility: Supine to Sit     Supine to sit: Min assist, HOB elevated     General bed mobility comments: increased time to scoot to EOB and repeat VC's to stay on task.    Transfers Overall transfer level: Needs assistance Equipment used: 1 person hand held assist Transfers: Sit to/from Stand Sit to Stand: Min assist           General transfer comment: required Min Assist for instability and repeat functional VC's to complete task and perform safely.  Pt impulsive and quick to sit without requards to IV line/walker placement.    Ambulation/Gait Ambulation/Gait assistance: Min assist, Mod assist Gait Distance (Feet): 75 Feet Assistive device: Rollator (4 wheels) Gait Pattern/deviations: Staggering left, Staggering right, Shuffle Gait velocity: decreased     General Gait Details: VERY unsteady gait using Rollator walker with seat as pt fatigues quickly she can sit and take a break on walker.  Required Max VC's on proper use, brakes and instructed repeatably to park walker up against a wall, lock brakes, turn around  prior to sitting.   Stairs             Wheelchair Mobility     Tilt Bed    Modified Rankin (Stroke Patients Only)       Balance                                            Cognition Arousal: Alert Behavior During Therapy: Impulsive, Agitated Overall Cognitive  Status: Impaired/Different from baseline                                 General Comments: Pt was AxO x2 easily distracted and masks her confusion with anger.  Required repaet functional VC's.  Pt would ask the same questions.  Poor memory.  Poor recall.        Exercises      General Comments        Pertinent Vitals/Pain Pain Assessment Pain Assessment: Faces Faces Pain Scale: Hurts a little bit Pain Location: drain site Pain Descriptors / Indicators: Aching Pain Intervention(s): Monitored  during session    Home Living                          Prior Function            PT Goals (current goals can now be found in the care plan section) Progress towards PT goals: Progressing toward goals    Frequency    Min 1X/week      PT Plan      Co-evaluation              AM-PAC PT "6 Clicks" Mobility   Outcome Measure  Help needed turning from your back to your side while in a flat bed without using bedrails?: A Little Help needed moving from lying on your back to sitting on the side of a flat bed without using bedrails?: A Little Help needed moving to and from a bed to a chair (including a wheelchair)?: A Little Help needed standing up from a chair using your arms (e.g., wheelchair or bedside chair)?: A Lot Help needed to walk in hospital room?: A Lot Help needed climbing 3-5 steps with a railing? : Total 6 Click Score: 14    End of Session Equipment Utilized During Treatment: Gait belt Activity Tolerance: Patient limited by fatigue Patient left: in bed;with family/visitor present;with call bell/phone within reach Nurse Communication: Mobility status PT Visit Diagnosis: Unsteadiness on feet (R26.81);Muscle weakness (generalized) (M62.81);Difficulty in walking, not elsewhere classified (R26.2)     Time: 1000-1017 PT Time Calculation (min) (ACUTE ONLY): 17 min  Charges:    $Gait Training: 8-22 mins PT General Charges $$ ACUTE PT VISIT: 1 Visit                     Felecia Shelling  PTA Acute  Rehabilitation Services Office M-F          [phone]

## 2023-06-13 NOTE — TOC Progression Note (Signed)
Transition of Care [facility]) - Progression Note    Patient Details  Name: FOREST OTEY MRN: [id number] Date of Birth: 1955/11/28  Transition of [facility]) CM/SW Contact  Larrie Kass, LCSW Phone Number: 06/13/2023, 4:35 PM  Clinical Narrative:     Pt will have to discharge back to [facility]. Will start insurance auth close to medical stability. TOC to follow.    Expected Discharge Plan: Skilled Nursing Facility Barriers to Discharge: Continued Medical Work up  Expected Discharge Plan and Services       Living arrangements for the past 2 months: Single Family Home                                       Social Determinants of Health (SDOH) Interventions SDOH Screenings   Food Insecurity: No Food Insecurity (06/10/2023)  Housing: Low Risk  (06/10/2023)  Transportation Needs: No Transportation Needs (06/10/2023)  Utilities: Not At Risk (06/10/2023)  Depression (PHQ2-9): Low Risk  (12/06/2022)  Financial Resource Strain: Low Risk  (11/06/2022)  Stress: No Stress Concern Present (11/06/2022)  Tobacco Use: Medium Risk (06/09/2023)    Readmission Risk Interventions    11/21/2022   10:10 AM 11/18/2022    2:11 PM  Readmission Risk Prevention Plan  Post Dischage Appt  Complete  Medication Screening  Complete  Transportation Screening Complete Complete  HRI or Home Care Consult Complete   Social Work Consult for Recovery Care Planning/Counseling Complete   Palliative Care Screening Not Applicable   Medication Review Oceanographer) Complete

## 2023-06-14 DIAGNOSIS — E871 Hypo-osmolality and hyponatremia: Secondary | ICD-10-CM | POA: Diagnosis not present

## 2023-06-14 LAB — COMPREHENSIVE METABOLIC PANEL
ALT: 302 U/L — ABNORMAL HIGH (ref 0–44)
AST: 145 U/L — ABNORMAL HIGH (ref 15–41)
Albumin: 2.4 g/dL — ABNORMAL LOW (ref 3.5–5.0)
Alkaline Phosphatase: 289 U/L — ABNORMAL HIGH (ref 38–126)
Anion gap: 8 (ref 5–15)
BUN: 48 mg/dL — ABNORMAL HIGH (ref 8–23)
CO2: 30 mmol/L (ref 22–32)
Calcium: 8.7 mg/dL — ABNORMAL LOW (ref 8.9–10.3)
Chloride: 96 mmol/L — ABNORMAL LOW (ref 98–111)
Creatinine, Ser: 0.77 mg/dL (ref 0.44–1.00)
GFR, Estimated: >60 mL/min (ref >60)
Glucose, Bld: 92 mg/dL (ref 70–99)
Potassium: 3 mmol/L — ABNORMAL LOW (ref 3.5–5.1)
Sodium: 134 mmol/L — ABNORMAL LOW (ref 135–145)
Total Bilirubin: 0.4 mg/dL (ref <1.2)
Total Protein: 5.9 g/dL — ABNORMAL LOW (ref 6.5–8.1)

## 2023-06-14 LAB — GLUCOSE, CAPILLARY
Glucose-Capillary: 118 mg/dL — ABNORMAL HIGH (ref 70–99)
Glucose-Capillary: 137 mg/dL — ABNORMAL HIGH (ref 70–99)
Glucose-Capillary: 123 mg/dL — ABNORMAL HIGH (ref 70–99)

## 2023-06-14 LAB — MAGNESIUM: Magnesium: 2.3 mg/dL (ref 1.7–2.4)

## 2023-06-14 LAB — PHOSPHORUS: Phosphorus: 2.5 mg/dL (ref 2.5–4.6)

## 2023-06-14 MED ORDER — FAT EMUL FISH OIL/PLANT BASED 20% (SMOFLIPID)IV EMUL
250.0000 mL | INTRAVENOUS | Status: AC
Start: 2023-06-14 — End: 2023-06-15
  Administered 2023-06-14: 250 mL via INTRAVENOUS
  Filled 2023-06-14: qty 250

## 2023-06-14 MED ORDER — TRACE MINERALS CU-MN-SE-ZN 300-55-60-3000 MCG/ML IV SOLN
INTRAVENOUS | Status: DC
  Filled 2023-06-14: qty 1000

## 2023-06-14 MED ORDER — ALPRAZOLAM 0.5 MG PO TABS
0.5000 mg | ORAL_TABLET | Freq: Three times a day (TID) | ORAL | Status: DC | PRN
  Administered 2023-06-14 – 2023-06-23 (×17): 0.5 mg via ORAL
  Filled 2023-06-14 (×19): qty 1

## 2023-06-14 MED ORDER — POTASSIUM CHLORIDE CRYS ER 20 MEQ PO TBCR
40.0000 meq | EXTENDED_RELEASE_TABLET | ORAL | Status: AC
  Administered 2023-06-14 (×2): 40 meq via ORAL
  Filled 2023-06-14 (×2): qty 2

## 2023-06-14 NOTE — Progress Notes (Signed)
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   Assessment:  67 y/o F with PMH of cervical cancer and rectovaginal fistula s/p redo low anterior resection with colovaginal fistula takedown, fistula repair, and hand sewn end to end sigmoidrectal anastomosis with loop ileostomy on 11/23/18; closure of loop ileostomy 05/01/20, SBO s/p ex-lap with lysis of adhesions on 11/08/20, recurrent SBO s/p ex-lap, LOA, distal ileum and cecum resection with end ileostomy creation on 03/14/2023 started on TPN, right hydronephrosis s/p R ureteral stent placement on 04/02/2023 with subsequent obstructive uropathy, UTI, AKI and bilateral hydronephrosis with placement of right nephrostomy tube on 05/08/2023 who presents with nausea/vomiting and hyponatremia. Pharmacy consulted to resume TPN on admission.   Glucose / Insulin:  No history of DM.  - on sSSI (used 6 units in the past 24 hrs) - CBGs last 24hr (goal <180): 183, 200, 132, 140, 118 Electrolytes: Na 134 up, CO2 30 up, K 3 Renal: AKI on CKD stage IIIa - SCr 0.77. BUN elevated but improved to 487 Hepatic: note hx transaminitis in PMH - AST/ALT and  Alk Phos elevated and continue to trend up - monitor closely, per discussion with Md will d/c depakote at this time - Tbili WNL.  - TG slightly elevated at 166 but improved - Albumin low 2.8 Intake / Output; MIVF: I/O -48 mL. MIVF changed to NaBicarb infusion at 20ml/hr on 12/26 and NS d/c'd then NaHCO3 d/c'd 12/28 currently off of maintenance IVFs - UOP: , up - Stool output: , down GI Imaging:  - 12/23 CT abdomen and pelvis: did not show SBO but showed moderate to marked severity right-sided hydronephrosis and proximal hydroureter with  percutaneous right-sided nephrostomy tube and right sided intraureteral stent in place along with mild to moderate severity left-sided hydronephrosis and proximal hydroureter with markedly dilated intrarenal calyces on the left.  GI Surgeries / Procedures: see above  Central access: PICC line in place TPN start date: continued from PTA  Nutritional Goals: Due to the Baxter IV fluid disruption, all adult parenteral nutrition will utilize premade Clinimix products +/- fat emulsion infusion. Our goal will be to continue providing as close to 100% of our patient's nutritional needs, but due to limited TPN Clinimix concentrations we will meet at least 80% of protein and 75% of kcal goals.  Per discussion with [employer] [facility] [employer], patient was receiving Clinimix E 5/15 1000 mL over 24 hours and Intralipid 20% 250 mL over 12 hours while at facility   -Goal TPN rate (Clinimix 8/10) is 42 mL/hr (provides 80g of protein and 1164 kcals per day) - meets 100% of protein goal and 78% of kcal goals with TPN alone  -Also, each Ensure Plus supplement (ordered BID) provides 350 kcal and 20g of protein     RD Assessment: Estimated Needs Total Energy Estimated Needs: 1500-1700 Total Protein Estimated Needs: 75-90g Total Fluid Estimated Needs: 1.7L/day  Current Nutrition:  Regular diet Ensure Plus BID TPN to resume this PM  Plan:  K replacement ordered per Md already - PO KCL total for today  At 1800: - Continue Clinimix E 8/10 at 66mL/hr at 1800 - Electrolytes in TPN (fixed; unable to adjust in premixed Clinimix): Na 68mEq/L, K 24mEq/L, Ca  65mEq/L, Mg 34mEq/L, Phos 15mmol/L, Acetate 83 mEq/L, Cl 76 mEq/L - Add standard MVI and trace elements to TPN - SMOFlipid 20% IV over 12 hours. Will monitor TG closely and may hold if TG increases significantly  - Note continued increase in LFTs - depakote has been stopped for now, TPN could be contributing to increase but this is  generally due to high glucose amount and/or rate of glucose in TPN but these are not elevated at this time (rate only 1.39 mg/kg/min). NO changes, continue to monitor closely - Continue  Sensitive q6h SSI for now, CBGs may be trending up and may need to add insulin to TPN in future - Thiamine 100mg  PO daily per RD recommendations - MIVF management per MD - Monitor TPN labs on Mon/Thurs and prn   Hessie Knows, PharmD, BCPS Secure Chat if ?s 06/14/2023 11:44 AM

## 2023-06-14 NOTE — Progress Notes (Signed)
Mobility Specialist - Progress Note   06/14/23 1449  Mobility  Activity Ambulated with assistance in hallway  Level of Assistance Contact guard assist, steadying assist  Assistive Device Front wheel walker  Distance Ambulated (ft) 60 ft  Range of Motion/Exercises Active  Activity Response Tolerated well  Mobility Referral Yes  Mobility visit 1 Mobility  Mobility Specialist Start Time (ACUTE ONLY) 1438  Mobility Specialist Stop Time (ACUTE ONLY) 1449  Mobility Specialist Time Calculation (min) (ACUTE ONLY) 11 min   Received in chair and agreed to mobility, endorsed lots of pain nearing sites of procedures. Had daughter chair follow and left pt in chair after session with all needs met, alarm on and family in room.  Marilynne Halsted Mobility Specialist

## 2023-06-14 NOTE — Progress Notes (Addendum)
PROGRESS NOTE    [name]  ONG:[id number] DOB: January 10, 1956 DOA: 06/09/2023 PCP: Patient, [name]   Brief Narrative:  67 y.o. female with medical history significant of cervical cancer and rectovaginal fistula s/p redo low anterior resection with colovaginal fistula takedown, fistula repair, and hand sewn end to end sigmoidrectal anastomosis with loop ileostomy on 11/23/18; closure of loop ileostomy 05/01/20, SBO s/p ex-lap with lysis of adhesions on 11/08/20, recurrent SBO s/p ex-lap, LOA, distal ileum and cecum resection with end ileostomy creation on 03/14/2023 started on TPN, right hydronephrosis s/p R ureteral stent placement on 04/02/2023 with subsequent obstructive uropathy, UTI, AKI and bilateral hydronephrosis with placement of right nephrostomy tube on 05/08/2023 presented with nausea, vomiting and hyponatremia from SNF.  Patient has been on TPN recently after her recent hospitalization at [facility] and discharged to SNF.  On presentation, she was tachypneic with sodium of 121 (last sodium on 05/24/2023 in care everywhere was 130).  Creatinine was 2.07 (last creatinine was 1.09 on 05/24/2023) with bicarb of 18.  CT of abdomen and pelvis did not show SBO but showed moderate to marked severity right-sided hydronephrosis and proximal hydroureter with percutaneous right-sided nephrostomy tube and right sided intraureteral stent in place along with mild to moderate severity left-sided hydronephrosis and proximal hydroureter with markedly dilated intrarenal calyces on the left.  She was started on IV fluids.  Assessment & Plan:   Hyponatremia -Possibly due to decreased oral intake and likely GI loss. -Sodium 121 on presentation.  Sodium 134 this morning.  IV fluids as below.  Encourage oral intake.  DC'd sertraline.  Acute kidney injury on chronic kidney disease stage IIIa Acute metabolic acidosis Possible UTI: Present on admission History of bilateral hydronephrosis  status post ureteral stent and right nephrostomy tube -Presented with creatinine of 2.07, last creatinine was 1.09 on 05/24/2023.  Creatinine 0.88 this morning.  Monitor.  Bicarb  30 today.  DC IV fluids -Imaging as above.  Discussed with Dr. Elenore Paddy on phone on 06/10/2023: He reviewed prior records, notes and imaging and stated that patient has an almost atrophic/nonfunctional left kidney with possible chronic hydronephrosis and patient would not need any procedure for left hydronephrosis.  As long as right nephrostomy tube is draining and kidney function is improving, patient will need to follow-up with urology as an outpatient. -Urology evaluation by Dr. Sherron Monday on 06/13/2023 appreciated: Recommended to continue nephrostomy tube for now and outpatient follow-up with regular urologist in [facility]. -UA suggestive of possible UTI.  Continue Rocephin.  Urine culture grew multiple species.  Hypothyroidism -Continue levothyroxine  Hyperlipidemia -Hold statin due to transaminitis  Transaminitis -LFTs still mildly elevated.  CT imaging showed no biliary dilatation.  Monitor intermittently.  Nausea and vomiting -Has been an ongoing chronic issue for her due to her complex abdominal surgical history.  No small bowel obstruction demonstrated on CT imaging on presentation.  Continue as needed antiemetics.  Outpatient follow-up with primary surgical team  Failure to thrive in adult --Pt has extension abdominal surgery due to hx of cervical cancer and currently on TPN  -Palliative care evaluation appreciated.  Patient remains full code.  History of cervical cancer and rectovaginal fistula status post surgical intervention -Currently has an ileostomy.  Outpatient follow-up with oncology/GYN oncology/general surgery  Anxiety/depression -Continue sertraline and mirtazapine  Physical deconditioning -Patient from SNF.  PT recommending SNF placement.  TOC consulted.   DVT prophylaxis:  Lovenox Code Status: Full Family Communication: Spoke to daughter on  06/13/23 on phone Disposition Plan: Status is: inpatient because: Of severity of illness  Consultants: Urology Procedures: None  Antimicrobials:  Anti-infectives (From admission, onward)    Start     Dose/Rate Route Frequency Ordered Stop   06/11/23 1200  cefTRIAXone (ROCEPHIN) 1 g in sodium chloride 0.9 % 100 mL IVPB        1 g 200 mL/hr over 30 Minutes Intravenous Every 24 hours 06/11/23 1154           Subjective: Patient seen and examined at bedside.  Poor historian.  No fever, seizures, agitation reported. Objective: Vitals:   06/13/23 1600 06/13/23 1700 06/13/23 2031 06/14/23 0628  BP:   98/70 98/67  Pulse:   93 88  Resp: 13 14 18 18   Temp:   97.8 F (36.6 C) 98.1 F (36.7 C)  TempSrc:   Oral Oral  SpO2:   100% 100%  Weight:      Height:        Intake/Output Summary (Last 24 hours) at 06/14/2023 0935 Last data filed at 06/14/2023 0658 Gross per 24 hour  Intake 2626.86 ml  Output 2250 ml  Net 376.86 ml   Filed Weights   06/10/23 0200 06/11/23 0457 06/13/23 0500  Weight: 49.2 kg 50.1 kg 50.2 kg    Examination:  General: No acute distress.  Remains on room air.  Chronically ill and deconditioned looking. ENT/neck: No obvious JVD elevation or palpable thyromegaly noted  respiratory: Bilateral decreased breath sounds at bases with scattered crackles CVS: Rate mostly controlled; S1 and S2 are heard abdominal: Soft, mildly tender; still slightly distended; no organomegaly, bowel sounds are heard normally.  Ileostomy bag present with liquid stool. Genitourinary: Right nephrostomy tube present extremities: No clubbing; mild lower extremity edema present  CNS: Alert; still slow to respond; poor historian.  No obvious focal deficits noted.  Lymph: No lymphadenopathy palpable  skin: No obvious rash/petechiae psych: Not agitated.  Flat affect mostly.   Musculoskeletal: No obvious joint  erythema/deformity Data Reviewed: I have personally reviewed following labs and imaging studies  CBC: Recent Labs  Lab 06/09/23 2031 06/10/23 0419 06/11/23 0340 06/12/23 0301 06/13/23 0331  WBC 9.1 7.3 6.5 7.3 5.9  NEUTROABS 8.3*  --  4.9 4.2 4.2  HGB 12.9 12.2 10.7* 10.1* 10.2*  HCT 36.0 36.1 31.6* 29.8* 29.4*  MCV 82.9 87.8 88.8 88.7 87.8  PLT 284 217 195 217 202   Basic Metabolic Panel: Recent Labs  Lab 06/10/23 0418 06/10/23 0419 06/10/23 1524 06/11/23 0340 06/12/23 0301 06/13/23 0331 06/14/23 0352  NA  --    < > 130* 127* 128* 130* 134*  K  --    < > 4.5 4.6 4.1 3.6 3.0*  CL  --    < > 101 102 105 102 96*  CO2  --    < > 19* 20* 16* 19* 30  GLUCOSE  --    < > 196* 132* 84 136* 92  BUN  --    < > 95* 87* 68* 60* 48*  CREATININE  --    < > 1.44* 1.26* 0.94 0.88 0.77  CALCIUM  --    < > 9.0 9.1 9.0 9.1 8.7*  MG 2.5*  --   --  2.3 2.0 1.7 2.3  PHOS 4.0  --   --  2.7 2.4* 3.2 2.5   < > = values in this interval not displayed.   GFR: Estimated Creatinine Clearance: 54 mL/min (by C-G formula based on SCr  of 0.77 mg/dL). Liver Function Tests: Recent Labs  Lab 06/10/23 0419 06/11/23 0340 06/12/23 0301 06/13/23 0331 06/14/23 0352  AST 50* 50* 112* 162* 145*  ALT 109* 105* 153* 259* 302*  ALKPHOS 269* 245* 258* 296* 289*  BILITOT 0.6 0.4 0.3 0.5 0.4  PROT 7.0 6.5 6.4* 6.5 5.9*  ALBUMIN 3.2* 3.0* 2.9* 2.8* 2.4*   Recent Labs  Lab 06/09/23 2031  LIPASE 48   No results for input(s): "AMMONIA" in the last 168 hours. Coagulation Profile: No results for input(s): "INR", "PROTIME" in the last 168 hours. Cardiac Enzymes: No results for input(s): "CKTOTAL", "CKMB", "CKMBINDEX", "TROPONINI" in the last 168 hours. BNP (last 3 results) No results for input(s): "PROBNP" in the last 8760 hours. HbA1C: No results for input(s): "HGBA1C" in the last 72 hours. CBG: Recent Labs  Lab 06/13/23 0546 06/13/23 1245 06/13/23 1643 06/13/23 2329 06/14/23 0551  GLUCAP  183* 200* 132* 140* 118*   Lipid Profile: Recent Labs    06/12/23 0301  TRIG 166*   Thyroid Function Tests: No results for input(s): "TSH", "T4TOTAL", "FREET4", "T3FREE", "THYROIDAB" in the last 72 hours.  Anemia Panel: No results for input(s): "VITAMINB12", "FOLATE", "FERRITIN", "TIBC", "IRON", "RETICCTPCT" in the last 72 hours.  Sepsis Labs: No results for input(s): "PROCALCITON", "LATICACIDVEN" in the last 168 hours.  Recent Results (from the past 240 hours)  Resp panel by RT-PCR (RSV, Flu A&B, Covid) Anterior Nasal Swab     Status: None   Collection Time: 06/09/23  8:47 PM   Specimen: Anterior Nasal Swab  Result Value Ref Range Status   SARS Coronavirus 2 by RT PCR NEGATIVE NEGATIVE Final    Comment: (NOTE) SARS-CoV-2 target nucleic acids are NOT DETECTED.  The SARS-CoV-2 RNA is generally detectable in upper respiratory specimens during the acute phase of infection. The lowest concentration of SARS-CoV-2 viral copies this assay can detect is 138 copies/mL. A negative result does not preclude SARS-Cov-2 infection and should not be used as the sole basis for treatment or other patient management decisions. A negative result may occur with  improper specimen collection/handling, submission of specimen other than nasopharyngeal swab, presence of viral mutation(s) within the areas targeted by this assay, and inadequate number of viral copies(<138 copies/mL). A negative result must be combined with clinical observations, patient history, and epidemiological information. The expected result is Negative.  Fact Sheet for Patients:  BloggerCourse.com  Fact Sheet for Healthcare Providers:  SeriousBroker.it  This test is no t yet approved or cleared by the [place] FDA and  has been authorized for detection and/or diagnosis of SARS-CoV-2 by FDA under an Emergency Use Authorization (EUA). This EUA will remain  in effect  (meaning this test can be used) for the duration of the COVID-19 declaration under Section 564(b)(1) of the Act, 21 U.S.C.section 360bbb-3(b)(1), unless the authorization is terminated  or revoked sooner.       Influenza A by PCR NEGATIVE NEGATIVE Final   Influenza B by PCR NEGATIVE NEGATIVE Final    Comment: (NOTE) The Xpert Xpress SARS-CoV-2/FLU/RSV plus assay is intended as an aid in the diagnosis of influenza from Nasopharyngeal swab specimens and should not be used as a sole basis for treatment. Nasal washings and aspirates are unacceptable for Xpert Xpress SARS-CoV-2/FLU/RSV testing.  Fact Sheet for Patients: BloggerCourse.com  Fact Sheet for Healthcare Providers: SeriousBroker.it  This test is not yet approved or cleared by the [place] FDA and has been authorized for detection and/or diagnosis of SARS-CoV-2 by FDA  under an Emergency Use Authorization (EUA). This EUA will remain in effect (meaning this test can be used) for the duration of the COVID-19 declaration under Section 564(b)(1) of the Act, 21 U.S.C. section 360bbb-3(b)(1), unless the authorization is terminated or revoked.     Resp Syncytial Virus by PCR NEGATIVE NEGATIVE Final    Comment: (NOTE) Fact Sheet for Patients: BloggerCourse.com  Fact Sheet for Healthcare Providers: SeriousBroker.it  This test is not yet approved or cleared by the [place] FDA and has been authorized for detection and/or diagnosis of SARS-CoV-2 by FDA under an Emergency Use Authorization (EUA). This EUA will remain in effect (meaning this test can be used) for the duration of the COVID-19 declaration under Section 564(b)(1) of the Act, 21 U.S.C. section 360bbb-3(b)(1), unless the authorization is terminated or revoked.  Performed at [facility], [address], [place], [place] [address]   MRSA  Next Gen by PCR, Nasal     Status: Abnormal   Collection Time: 06/10/23  2:30 AM   Specimen: Nasal Mucosa; Nasal Swab  Result Value Ref Range Status   MRSA by PCR Next Gen DETECTED (A) NOT DETECTED Final    Comment: (NOTE) The GeneXpert MRSA Assay (FDA approved for NASAL specimens only), is one component of a comprehensive MRSA colonization surveillance program. It is not intended to diagnose MRSA infection nor to guide or monitor treatment for MRSA infections. Test performance is not FDA approved in patients less than 66 years old. Performed at [facility], [address] [place], [place] [address]   Urine Culture (for pregnant, neutropenic or urologic patients or patients with an indwelling urinary catheter)     Status: Abnormal   Collection Time: 06/10/23  6:00 PM   Specimen: Urine, Bag (ped)  Result Value Ref Range Status   Specimen Description   Final    URINE, BAG PED Performed at [facility], [address], [place], [place] [address]    Special Requests   Final    NONE Performed at [facility], [address] [place], [place] [address]    Culture MULTIPLE SPECIES PRESENT, SUGGEST RECOLLECTION (A)  Final   Report Status 06/12/2023 FINAL  Final         Radiology Studies: No results found.       Scheduled Meds:  acetaminophen  1,000 mg Oral Once   divalproex  125 mg Oral QHS   enoxaparin (LOVENOX) injection  40 mg Subcutaneous Q24H   feeding supplement  237 mL Oral BID BM   insulin aspart  0-9 Units Subcutaneous Q6H   levothyroxine  25 mcg Oral Q0600   mirtazapine  7.5 mg Oral QHS   mupirocin ointment  1 Application Nasal BID   pantoprazole  40 mg Oral Daily   potassium chloride  40 mEq Oral Q4H   sertraline  25 mg Oral Daily   sodium chloride flush  10-40 mL Intracatheter Q12H   thiamine  100 mg Oral Daily   Continuous Infusions:  cefTRIAXone (ROCEPHIN)  IV Stopped (06/13/23 1339)   TPN  (CLINIMIX-E) Adult 42 mL/hr at 06/13/23 1827          Glade Lloyd, MD [facility] 06/14/2023, 9:35 AM

## 2023-06-15 DIAGNOSIS — E871 Hypo-osmolality and hyponatremia: Secondary | ICD-10-CM | POA: Diagnosis not present

## 2023-06-15 LAB — COMPREHENSIVE METABOLIC PANEL
ALT: 443 U/L — ABNORMAL HIGH (ref 0–44)
AST: 234 U/L — ABNORMAL HIGH (ref 15–41)
Albumin: 2.8 g/dL — ABNORMAL LOW (ref 3.5–5.0)
Alkaline Phosphatase: 378 U/L — ABNORMAL HIGH (ref 38–126)
Anion gap: 10 (ref 5–15)
BUN: 53 mg/dL — ABNORMAL HIGH (ref 8–23)
CO2: 30 mmol/L (ref 22–32)
Calcium: 9.2 mg/dL (ref 8.9–10.3)
Chloride: 90 mmol/L — ABNORMAL LOW (ref 98–111)
Creatinine, Ser: 0.82 mg/dL (ref 0.44–1.00)
GFR, Estimated: >60 mL/min (ref >60)
Glucose, Bld: 96 mg/dL (ref 70–99)
Potassium: 3.5 mmol/L (ref 3.5–5.1)
Sodium: 130 mmol/L — ABNORMAL LOW (ref 135–145)
Total Bilirubin: 0.4 mg/dL (ref <1.2)
Total Protein: 6.2 g/dL — ABNORMAL LOW (ref 6.5–8.1)

## 2023-06-15 LAB — GLUCOSE, CAPILLARY
Glucose-Capillary: 127 mg/dL — ABNORMAL HIGH (ref 70–99)
Glucose-Capillary: 165 mg/dL — ABNORMAL HIGH (ref 70–99)
Glucose-Capillary: 243 mg/dL — ABNORMAL HIGH (ref 70–99)
Glucose-Capillary: 257 mg/dL — ABNORMAL HIGH (ref 70–99)
Glucose-Capillary: 85 mg/dL (ref 70–99)

## 2023-06-15 LAB — MAGNESIUM: Magnesium: 2.4 mg/dL (ref 1.7–2.4)

## 2023-06-15 LAB — PHOSPHORUS: Phosphorus: 2.7 mg/dL (ref 2.5–4.6)

## 2023-06-15 MED ORDER — SODIUM CHLORIDE 0.9 % IV SOLN: INTRAVENOUS | Status: DC

## 2023-06-15 MED ORDER — PHENAZOPYRIDINE HCL 100 MG PO TABS
100.0000 mg | ORAL_TABLET | Freq: Once | ORAL | Status: AC
  Administered 2023-06-15: 100 mg via ORAL
  Filled 2023-06-15 (×2): qty 1

## 2023-06-15 MED ORDER — LORAZEPAM 2 MG/ML IJ SOLN: 1.0000 mg | Freq: Once | INTRAMUSCULAR | Status: DC

## 2023-06-15 MED ORDER — CHLORHEXIDINE GLUCONATE CLOTH 2 % EX PADS
6.0000 | MEDICATED_PAD | Freq: Every day | CUTANEOUS | Status: DC
  Administered 2023-06-15 – 2023-06-24 (×9): 6 via TOPICAL

## 2023-06-15 MED ORDER — HALOPERIDOL LACTATE 5 MG/ML IJ SOLN
2.0000 mg | Freq: Once | INTRAMUSCULAR | Status: AC
  Administered 2023-06-15: 2 mg via INTRAVENOUS
  Filled 2023-06-15: qty 1

## 2023-06-15 NOTE — Progress Notes (Signed)
Mobility Specialist - Progress Note   06/15/23 1340  Mobility  Activity Ambulated with assistance in hallway  Level of Assistance Contact guard assist, steadying assist  Assistive Device Front wheel walker  Distance Ambulated (ft) 110 ft  Range of Motion/Exercises Active  Activity Response Tolerated well  Mobility Referral Yes  Mobility visit 1 Mobility  Mobility Specialist Start Time (ACUTE ONLY) 1258  Mobility Specialist Stop Time (ACUTE ONLY) 1314  Mobility Specialist Time Calculation (min) (ACUTE ONLY) 16 min   Received in bed and agreed to mobility. Had no issues throughout session. Returned to chair with all needs met.  Marilynne Halsted Mobility Specialist

## 2023-06-15 NOTE — Consult Note (Signed)
Referring Provider: Dr. Hanley Ben Primary Care Physician:  Patient, [name] Primary Gastroenterologist:  Seedorf Fitz  Reason for Consultation:  Elevated liver enzymes  HPI: [name] is a 67 y.o. female with multiple bowel surgeries most recently distal ileal and cecectomy with end ileostomy in September and started on TPN. History of rectovaginal fistula and low anterior resection and lysis of adhesions. Admitted for hyponatremia and acute on chronic kidney injury. LFTs have risen since 12/25 going from TB 0,4, ALP 245, AST 50, ALT 105 to TB 0.4, ALP 378, AST 234, ALT 443 today. CT negative for biliary dilation and previous cholecystectomy. Reports right sided pain near RLQ ostomy this morning that has resolved. Denies N/V.    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   Thyroid disease    Vertigo     Past Surgical History:  Procedure Laterality Date   ABDOMINAL ADHESION SURGERY  11/08/2020   Dr [name] - [facility]   ABDOMINAL HYSTERECTOMY  2003   [facility] - Dr [name] - for cervical cancer   BILE DUCT EXPLORATION  10/22/2003   CHOLECYSTECTOMY OPEN  10/22/2003   Dr Zachery Dakins - CBD repair w T tube   CYSTOSCOPY  2021   ILEOSTOMY  11/23/2018   ILEOSTOMY CLOSURE  04/2020   LOW ANTERIOR BOWEL RESECTION  11/23/2018   LAR/takedown colovaginal fistula/loop ileostomy  - [facility]    Prior to Admission medications   Medication Sig Start Date End Date Taking? Authorizing Provider  acetaminophen (TYLENOL) 500 MG tablet Take 1,000 mg by mouth every 6 (six) hours as needed.   Yes [provider]  Amino Acid Infusion in D15W (CLINIMIX/DEXTROSE, 5/15, IV) Inject 42 mLs into the vein See admin instructions. 42 ml per hour, intravenously every 24 hours   Yes [provider]  cetirizine (ZYRTEC) 10 MG tablet Take 10 mg by mouth daily. PRN   Yes  [provider]  divalproex (DEPAKOTE) 125 MG DR tablet Take 125 mg by mouth at bedtime.   Yes [provider]  dronabinol (MARINOL) 2.5 MG capsule Take 2.5 mg by mouth 2 (two) times daily.   Yes [provider]  Fat Emulsion Plant Based (FAT EMULSION 20 %, TPN/TNA, FOR ANES DOCUMENTATION) 20 % EMUL Inject 21 mLs into the vein continuous. 21 ml per hour every 24 hour (250 ml over 12 hours), once lipid bag is completed, disconnect lipid line, flush picc line with 5-10 ml saline flush.   Yes [provider]  fluticasone (FLONASE) 50 MCG/ACT nasal spray Place 1 spray into both nostrils daily. 08/30/22  Yes [provider]  hydrOXYzine (ATARAX) 25 MG tablet Take 25 mg by mouth every 8 (eight) hours as needed for anxiety.   Yes [provider]  Lactobacillus Rhamnosus, GG, (CULTURELLE HEALTH & WELLNESS) CAPS Take 1 capsule by mouth daily. 05/21/23  Yes [provider]  levothyroxine (SYNTHROID) 25 MCG tablet Take 1 tablet (25 mcg total) by mouth daily. 07/17/22  Yes Gottschalk, Kathie Rhodes M, DO  meloxicam (MOBIC) 7.5 MG tablet Take 7.5 mg by mouth daily. For 7 days   Yes [provider]  mirtazapine (REMERON) 7.5 MG tablet Take 7.5 mg by mouth at bedtime.   Yes [provider]  Multiple Vitamins-Minerals (MULTIVITAMIN) tablet Take 1 tablet by mouth daily.  Yes [provider]  Multiple Vitamins-Minerals (ONCOVITE) TABS Take 1 tablet by mouth daily. 05/21/23  Yes [provider]  mupirocin ointment (BACTROBAN) 2 % 1 Application 2 (two) times daily. To red area around insertion site for 7 days   Yes [provider]  omeprazole (PRILOSEC) 20 MG capsule Take 20 mg by mouth in the morning. 05/21/23  Yes [provider]  ondansetron (ZOFRAN) 4 MG tablet Take 4 mg by mouth every 6 (six) hours as needed for nausea or vomiting.   Yes [provider]  ondansetron (ZOFRAN) 4 MG/2ML SOLN injection  Inject 4 mg into the vein once as needed for nausea.   Yes [provider]  Ethelda Chick Calcium 500 MG TABS Take 1,250 mg by mouth daily. 05/21/23  Yes [provider]  polyethylene glycol powder (GLYCOLAX/MIRALAX) 17 GM/SCOOP powder Take 1 Container by mouth once. Give 1 scoop by mouth every 24 hours as needed for constipation   Yes [provider]  promethazine (PHENERGAN) 12.5 MG tablet Take 12.5 mg by mouth every 6 (six) hours as needed for nausea or vomiting.   Yes [provider]  sertraline (ZOLOFT) 25 MG tablet Take 25 mg by mouth daily.   Yes [provider]  simvastatin (ZOCOR) 20 MG tablet Take 1 tablet (20 mg total) by mouth daily at 6 PM. 12/06/22  Yes Gottschalk, Ashly M, DO  sodium chloride 1 g tablet Take 1 g by mouth daily. For 3 days   Yes [provider]  ciprofloxacin (CIPRO) 500 MG tablet Take 500 mg by mouth 2 (two) times daily. Patient not taking: Reported on 06/10/2023 05/21/23   [provider]    Scheduled Meds:  acetaminophen  1,000 mg Oral Once   Chlorhexidine Gluconate Cloth  6 each Topical Daily   enoxaparin (LOVENOX) injection  40 mg Subcutaneous Q24H   feeding supplement  237 mL Oral BID BM   insulin aspart  0-9 Units Subcutaneous Q6H   levothyroxine  25 mcg Oral Q0600   mirtazapine  7.5 mg Oral QHS   pantoprazole  40 mg Oral Daily   sertraline  25 mg Oral Daily   sodium chloride flush  10-40 mL Intracatheter Q12H   thiamine  100 mg Oral Daily   Continuous Infusions:  sodium chloride 50 mL/hr at 06/15/23 1115   cefTRIAXone (ROCEPHIN)  IV 1 g (06/14/23 1235)   PRN Meds:.acetaminophen, ALPRAZolam, haloperidol lactate, lip balm, ondansetron (ZOFRAN) IV, mouth rinse  Allergies as of 06/09/2023 - Review Complete 06/09/2023  Allergen Reaction Noted   Barium-containing compounds  03/17/2019   Percocet [oxycodone-acetaminophen] Itching and Other (See Comments) 03/04/2014    Family History   Problem Relation Age of Onset   Heart disease Father    Diabetes Father    Parkinson's disease Neg Hx     Social History   Socioeconomic History   Marital status: Married    Spouse name: Not on file   Number of children: 1   Years of education: Not on file   Highest education level: Not on file  Occupational History   Occupation: retired    Comment: Conservation officer, 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  Tobacco Use   Smoking status: Former    Current packs/day: 0.15    Types: Cigarettes   Smokeless tobacco: Never  Vaping Use   Vaping status: Never Used  Substance and Sexual Activity   Alcohol use: Not Currently   Drug use: No   Sexual activity: Not Currently  Other  Topics Concern   Not on file  Social History Narrative   Patient is a retired Conservation officer, 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   Patient is married and has 1 daughter and 1 granddaughter who reside about 4 miles from her   Social Drivers of Corporate investment banker Strain: Low Risk  (11/06/2022)   Overall Financial Resource Strain (CARDIA)    Difficulty of Paying Living Expenses: Not hard at all  Food Insecurity: No Food Insecurity (06/10/2023)   Hunger Vital Sign    Worried About Running Out of Food in the Last Year: Never true    Ran Out of Food in the Last Year: Never true  Transportation Needs: No Transportation Needs (06/10/2023)   PRAPARE - Administrator, Civil Service (Medical): No    Lack of Transportation (Non-Medical): No  Physical Activity: Not on file  Stress: No Stress Concern Present (11/06/2022)   [employer] of Occupational Health - Occupational Stress Questionnaire    Feeling of Stress : Only a little  Social Connections: Not on file  Intimate Partner Violence: Not At Risk (06/10/2023)   Humiliation, Afraid, Rape, and Kick questionnaire    Fear of Current or Ex-Partner: No    Emotionally Abused: No    Physically Abused: No    Sexually Abused: No    Review of Systems: All negative  except as stated above in HPI.  Physical Exam: Vital signs: Vitals:   06/14/23 2122 06/15/23 0612  BP: 106/76 94/62  Pulse: 81 88  Resp: 20 16  Temp: 98 F (36.7 C) 97.6 F (36.4 C)  SpO2: 100% 100%   Last BM Date : 06/14/23 General:   lethargic, chronically ill-appearing, thin, no acute distress  Head: normocephalic, atraumatic Eyes: anicteric sclera ENT: oropharynx clear Neck: supple, nontender Lungs:  Clear throughout to auscultation.   No wheezes, crackles, or rhonchi. No acute distress. Heart:  Regular rate and rhythm; no murmurs, clicks, rubs,  or gallops. Abdomen: tenderness with minimal guarding, above RLQ ostomy, greenish loose stool in ostomy bag, soft, nontender otherwise,  +BS Rectal:  Deferred Ext: no edema  GI:  Lab Results: Recent Labs    06/13/23 0331  WBC 5.9  HGB 10.2*  HCT 29.4*  PLT 202   BMET Recent Labs    06/13/23 0331 06/14/23 0352 06/15/23 0328  NA 130* 134* 130*  K 3.6 3.0* 3.5  CL 102 96* 90*  CO2 19* 30 30  GLUCOSE 136* 92 96  BUN 60* 48* 53*  CREATININE 0.88 0.77 0.82  CALCIUM 9.1 8.7* 9.2   LFT Recent Labs    06/15/23 0328  PROT 6.2*  ALBUMIN 2.8*  AST 234*  ALT 443*  ALKPHOS 378*  BILITOT 0.4   PT/INR No results for input(s): "LABPROT", "INR" in the last 72 hours.   Studies/Results: No results found.  Impression/Plan: Elevated LFTs likely due to TPN vs meds. Doubt biliary obstruction. Agree with holding TPN and f/u on LFTs tomorrow. Supportive care. Dr. Lorenso Quarry to f/u tomorrow.    LOS: 5 days   Shirley Friar  06/15/2023, 11:46 AM  Questions please call [phone]

## 2023-06-15 NOTE — Plan of Care (Signed)
  Problem: Education: Goal: Knowledge of General Education information will improve Description: Including pain rating scale, medication(s)/side effects and non-pharmacologic comfort measures Outcome: Progressing   Problem: Health Behavior/Discharge Planning: Goal: Ability to manage health-related needs will improve Outcome: Progressing   Problem: Clinical Measurements: Goal: Will remain free from infection Outcome: Progressing   Problem: Safety: Goal: Ability to remain free from injury will improve Outcome: Progressing   

## 2023-06-15 NOTE — Progress Notes (Signed)
PROGRESS NOTE    [name]  BJY:[id number] DOB: [birth year] DOA: 06/09/2023 PCP: Patient, [name]   Brief Narrative:  67 y.o. female with medical history significant of cervical cancer and rectovaginal fistula s/p redo low anterior resection with colovaginal fistula takedown, fistula repair, and hand sewn end to end sigmoidrectal anastomosis with loop ileostomy on 11/23/18; closure of loop ileostomy 05/01/20, SBO s/p ex-lap with lysis of adhesions on 11/08/20, recurrent SBO s/p ex-lap, LOA, distal ileum and cecum resection with end ileostomy creation on 03/14/2023 started on TPN, right hydronephrosis s/p R ureteral stent placement on 04/02/2023 with subsequent obstructive uropathy, UTI, AKI and bilateral hydronephrosis with placement of right nephrostomy tube on 05/08/2023 presented with nausea, vomiting and hyponatremia from SNF.  Patient has been on TPN recently after her recent hospitalization at [facility] and discharged to SNF.  On presentation, she was tachypneic with sodium of 121 (last sodium on 05/24/2023 in care everywhere was 130).  Creatinine was 2.07 (last creatinine was 1.09 on 05/24/2023) with bicarb of 18.  CT of abdomen and pelvis did not show SBO but showed moderate to marked severity right-sided hydronephrosis and proximal hydroureter with percutaneous right-sided nephrostomy tube and right sided intraureteral stent in place along with mild to moderate severity left-sided hydronephrosis and proximal hydroureter with markedly dilated intrarenal calyces on the left.  She was started on IV fluids.  TPN was resumed.  She was started on IV antibiotics for possible UTI.  Urology was also consulted and recommended conservative management and outpatient follow-up.  PT recommending SNF placement.  TOC following.  Assessment & Plan:   Hyponatremia -Possibly due to decreased oral intake and likely GI loss. -Sodium 121 on presentation.  Sodium 130 this morning.  IV  fluids as below.  Encourage oral intake.  DC'd sertraline.  Acute kidney injury on chronic kidney disease stage IIIa Acute metabolic acidosis Possible UTI: Present on admission History of bilateral hydronephrosis status post ureteral stent and right nephrostomy tube -Presented with creatinine of 2.07, last creatinine was 1.09 on 05/24/2023.  Creatinine 0.82 this morning.  Acidosis has resolved.  Off of bicarb drip. -Imaging as above.  Discussed with Dr. Elenore Paddy on phone on 06/10/2023: He reviewed prior records, notes and imaging and stated that patient has an almost atrophic/nonfunctional left kidney with possible chronic hydronephrosis and patient would not need any procedure for left hydronephrosis.  As long as right nephrostomy tube is draining and kidney function is improving, patient will need to follow-up with urology as an outpatient. -Urology evaluation by Dr. Sherron Monday on 06/13/2023 appreciated: Recommended to continue nephrostomy tube for now and outpatient follow-up with regular urologist in [facility]. -UA suggestive of possible UTI.  Today is day 5 of Rocephin.  DC after today's dose.  Urine culture grew multiple species.  Hypothyroidism -Continue levothyroxine  Hyperlipidemia -Hold statin due to transaminitis  Transaminitis -LFTs continue to trend upwards.  CT imaging showed no biliary dilatation.  Stop TPN today.  Will start gentle hydration with normal saline.  I have asked GI to weigh in.  Nausea and vomiting -Has been an ongoing chronic issue for her due to her complex abdominal surgical history.  No small bowel obstruction demonstrated on CT imaging on presentation.  Continue as needed antiemetics.  Currently improving.  Outpatient follow-up with primary surgical team  Failure to thrive in adult --Pt has extension abdominal surgery due to hx of cervical cancer and currently on TPN  -Palliative care evaluation appreciated.  Patient remains full code.  History of  cervical cancer and rectovaginal fistula status post surgical intervention -Currently has an ileostomy.  Outpatient follow-up with oncology/GYN oncology/general surgery  Anxiety/depression -Continue sertraline and mirtazapine along with as needed Xanax  Physical deconditioning -Patient from SNF.  PT recommending SNF placement.  TOC following  DVT prophylaxis: Lovenox Code Status: Full Family Communication: Spoke to daughter on 06/14/23 on phone is not at bedside today. Disposition Plan: Status is: inpatient because: Of severity of illness  Consultants: Urology.  Palliative care.  Consult GI Procedures: None  Antimicrobials:  Anti-infectives (From admission, onward)    Start     Dose/Rate Route Frequency Ordered Stop   06/11/23 1200  cefTRIAXone (ROCEPHIN) 1 g in sodium chloride 0.9 % 100 mL IVPB        1 g 200 mL/hr over 30 Minutes Intravenous Every 24 hours 06/11/23 1154           Subjective: Patient seen and examined at bedside.  Poor historian.  No agitation, seizures, vomiting reported.  Complains of mild abdominal pain near the stoma.   Objective: Vitals:   06/13/23 2031 06/14/23 0628 06/14/23 2122 06/15/23 0612  BP:  98/67 106/76 94/62  Pulse:  88 81 88  Resp: 18 18 20 16   Temp:  98.1 F (36.7 C) 98 F (36.7 C) 97.6 F (36.4 C)  TempSrc:  Oral Oral Oral  SpO2: 100% 100% 100% 100%  Weight:      Height:        Intake/Output Summary (Last 24 hours) at 06/15/2023 0923 Last data filed at 06/15/2023 0824 Gross per 24 hour  Intake 952.85 ml  Output 2000 ml  Net -1047.15 ml   Filed Weights   06/10/23 0200 06/11/23 0457 06/13/23 0500  Weight: 49.2 kg 50.1 kg 50.2 kg    Examination:  General: On room air.  No distress.  Chronically ill and deconditioned looking. ENT/neck: No obvious JVD elevation or palpable neck masses noted  respiratory: Bilateral decreased breath sounds at bases bilaterally with some crackles  CVS: S1-S 2 heard; rate currently  controlled abdominal: Soft, tender slightly with some distention; no organomegaly, normal bowel sounds heard.  Ileostomy bag present with liquid stool. Genitourinary: Has right nephrostomy tube  extremities: Trace lower extremity edema present; no cyanosis  CNS:; Still slow to respond and a poor historian.  No focal deficit noted.   Lymph: No obvious lymphadenopathy palpable  skin: No obvious lesions/ecchymosis  psych:  Intermittently crying.  No signs of agitation Musculoskeletal: No obvious joint swelling/tenderness   data Reviewed: I have personally reviewed following labs and imaging studies  CBC: Recent Labs  Lab 06/09/23 2031 06/10/23 0419 06/11/23 0340 06/12/23 0301 06/13/23 0331  WBC 9.1 7.3 6.5 7.3 5.9  NEUTROABS 8.3*  --  4.9 4.2 4.2  HGB 12.9 12.2 10.7* 10.1* 10.2*  HCT 36.0 36.1 31.6* 29.8* 29.4*  MCV 82.9 87.8 88.8 88.7 87.8  PLT 284 217 195 217 202   Basic Metabolic Panel: Recent Labs  Lab 06/11/23 0340 06/12/23 0301 06/13/23 0331 06/14/23 0352 06/15/23 0328  NA 127* 128* 130* 134* 130*  K 4.6 4.1 3.6 3.0* 3.5  CL 102 105 102 96* 90*  CO2 20* 16* 19* 30 30  GLUCOSE 132* 84 136* 92 96  BUN 87* 68* 60* 48* 53*  CREATININE 1.26* 0.94 0.88 0.77 0.82  CALCIUM 9.1 9.0 9.1 8.7* 9.2  MG 2.3 2.0 1.7 2.3 2.4  PHOS 2.7 2.4* 3.2 2.5 2.7  GFR: Estimated Creatinine Clearance: 52.7 mL/min (by C-G formula based on SCr of 0.82 mg/dL). Liver Function Tests: Recent Labs  Lab 06/11/23 0340 06/12/23 0301 06/13/23 0331 06/14/23 0352 06/15/23 0328  AST 50* 112* 162* 145* 234*  ALT 105* 153* 259* 302* 443*  ALKPHOS 245* 258* 296* 289* 378*  BILITOT 0.4 0.3 0.5 0.4 0.4  PROT 6.5 6.4* 6.5 5.9* 6.2*  ALBUMIN 3.0* 2.9* 2.8* 2.4* 2.8*   Recent Labs  Lab 06/09/23 2031  LIPASE 48   No results for input(s): "AMMONIA" in the last 168 hours. Coagulation Profile: No results for input(s): "INR", "PROTIME" in the last 168 hours. Cardiac Enzymes: No results for  input(s): "CKTOTAL", "CKMB", "CKMBINDEX", "TROPONINI" in the last 168 hours. BNP (last 3 results) No results for input(s): "PROBNP" in the last 8760 hours. HbA1C: No results for input(s): "HGBA1C" in the last 72 hours. CBG: Recent Labs  Lab 06/14/23 0551 06/14/23 1218 06/14/23 1737 06/15/23 0002 06/15/23 0605  GLUCAP 118* 137* 123* 165* 127*   Lipid Profile: No results for input(s): "CHOL", "HDL", "LDLCALC", "TRIG", "CHOLHDL", "LDLDIRECT" in the last 72 hours.  Thyroid Function Tests: No results for input(s): "TSH", "T4TOTAL", "FREET4", "T3FREE", "THYROIDAB" in the last 72 hours.  Anemia Panel: No results for input(s): "VITAMINB12", "FOLATE", "FERRITIN", "TIBC", "IRON", "RETICCTPCT" in the last 72 hours.  Sepsis Labs: No results for input(s): "PROCALCITON", "LATICACIDVEN" in the last 168 hours.  Recent Results (from the past 240 hours)  Resp panel by RT-PCR (RSV, Flu A&B, Covid) Anterior Nasal Swab     Status: None   Collection Time: 06/09/23  8:47 PM   Specimen: Anterior Nasal Swab  Result Value Ref Range Status   SARS Coronavirus 2 by RT PCR NEGATIVE NEGATIVE Final    Comment: (NOTE) SARS-CoV-2 target nucleic acids are NOT DETECTED.  The SARS-CoV-2 RNA is generally detectable in upper respiratory specimens during the acute phase of infection. The lowest concentration of SARS-CoV-2 viral copies this assay can detect is 138 copies/mL. A negative result does not preclude SARS-Cov-2 infection and should not be used as the sole basis for treatment or other patient management decisions. A negative result may occur with  improper specimen collection/handling, submission of specimen other than nasopharyngeal swab, presence of viral mutation(s) within the areas targeted by this assay, and inadequate number of viral copies(<138 copies/mL). A negative result must be combined with clinical observations, patient history, and epidemiological information. The expected result is  Negative.  Fact Sheet for Patients:  BloggerCourse.com  Fact Sheet for Healthcare Providers:  SeriousBroker.it  This test is no t yet approved or cleared by the [place] FDA and  has been authorized for detection and/or diagnosis of SARS-CoV-2 by FDA under an Emergency Use Authorization (EUA). This EUA will remain  in effect (meaning this test can be used) for the duration of the COVID-19 declaration under Section 564(b)(1) of the Act, 21 U.S.C.section 360bbb-3(b)(1), unless the authorization is terminated  or revoked sooner.       Influenza A by PCR NEGATIVE NEGATIVE Final   Influenza B by PCR NEGATIVE NEGATIVE Final    Comment: (NOTE) The Xpert Xpress SARS-CoV-2/FLU/RSV plus assay is intended as an aid in the diagnosis of influenza from Nasopharyngeal swab specimens and should not be used as a sole basis for treatment. Nasal washings and aspirates are unacceptable for Xpert Xpress SARS-CoV-2/FLU/RSV testing.  Fact Sheet for Patients: BloggerCourse.com  Fact Sheet for Healthcare Providers: SeriousBroker.it  This test is not yet approved or cleared by  the [employer] and has been authorized for detection and/or diagnosis of SARS-CoV-2 by FDA under an Emergency Use Authorization (EUA). This EUA will remain in effect (meaning this test can be used) for the duration of the COVID-19 declaration under Section 564(b)(1) of the Act, 21 U.S.C. section 360bbb-3(b)(1), unless the authorization is terminated or revoked.     Resp Syncytial Virus by PCR NEGATIVE NEGATIVE Final    Comment: (NOTE) Fact Sheet for Patients: BloggerCourse.com  Fact Sheet for Healthcare Providers: SeriousBroker.it  This test is not yet approved or cleared by the [place] FDA and has been authorized for detection and/or diagnosis of  SARS-CoV-2 by FDA under an Emergency Use Authorization (EUA). This EUA will remain in effect (meaning this test can be used) for the duration of the COVID-19 declaration under Section 564(b)(1) of the Act, 21 U.S.C. section 360bbb-3(b)(1), unless the authorization is terminated or revoked.  Performed at [facility], [address], [place], [place] [address]   MRSA Next Gen by PCR, Nasal     Status: Abnormal   Collection Time: 06/10/23  2:30 AM   Specimen: Nasal Mucosa; Nasal Swab  Result Value Ref Range Status   MRSA by PCR Next Gen DETECTED (A) NOT DETECTED Final    Comment: (NOTE) The GeneXpert MRSA Assay (FDA approved for NASAL specimens only), is one component of a comprehensive MRSA colonization surveillance program. It is not intended to diagnose MRSA infection nor to guide or monitor treatment for MRSA infections. Test performance is not FDA approved in patients less than 74 years old. Performed at [facility], [address] [place], [place] [address]   Urine Culture (for pregnant, neutropenic or urologic patients or patients with an indwelling urinary catheter)     Status: Abnormal   Collection Time: 06/10/23  6:00 PM   Specimen: Urine, Bag (ped)  Result Value Ref Range Status   Specimen Description   Final    URINE, BAG PED Performed at [facility], [address], [place], [place] [address]    Special Requests   Final    NONE Performed at [facility], [address] [place], [place] [address]    Culture MULTIPLE SPECIES PRESENT, SUGGEST RECOLLECTION (A)  Final   Report Status 06/12/2023 FINAL  Final         Radiology Studies: No results found.       Scheduled Meds:  acetaminophen  1,000 mg Oral Once   Chlorhexidine Gluconate Cloth  6 each Topical Daily   enoxaparin (LOVENOX) injection  40 mg Subcutaneous Q24H   feeding supplement  237 mL Oral BID BM   insulin aspart   0-9 Units Subcutaneous Q6H   levothyroxine  25 mcg Oral Q0600   mirtazapine  7.5 mg Oral QHS   pantoprazole  40 mg Oral Daily   sertraline  25 mg Oral Daily   sodium chloride flush  10-40 mL Intracatheter Q12H   thiamine  100 mg Oral Daily   Continuous Infusions:  cefTRIAXone (ROCEPHIN)  IV 1 g (06/14/23 1235)   TPN (CLINIMIX-E) Adult 42 mL/hr at 06/14/23 1709          Glade Lloyd, MD [facility] 06/15/2023, 9:23 AM

## 2023-06-15 NOTE — Plan of Care (Signed)

## 2023-06-16 DIAGNOSIS — E871 Hypo-osmolality and hyponatremia: Secondary | ICD-10-CM | POA: Diagnosis not present

## 2023-06-16 LAB — CBC WITH DIFFERENTIAL/PLATELET
Abs Immature Granulocytes: 0.02 10*3/uL (ref 0.00–0.07)
Basophils Absolute: 0 10*3/uL (ref 0.0–0.1)
Basophils Relative: 1 %
Eosinophils Absolute: 0.3 10*3/uL (ref 0.0–0.5)
Eosinophils Relative: 7 %
HCT: 30.6 % — ABNORMAL LOW (ref 36.0–46.0)
Hemoglobin: 10.4 g/dL — ABNORMAL LOW (ref 12.0–15.0)
Immature Granulocytes: 0 %
Lymphocytes Relative: 29 %
Lymphs Abs: 1.3 10*3/uL (ref 0.7–4.0)
MCH: 30.7 pg (ref 26.0–34.0)
MCHC: 34 g/dL (ref 30.0–36.0)
MCV: 90.3 fL (ref 80.0–100.0)
Monocytes Absolute: 0.4 10*3/uL (ref 0.1–1.0)
Monocytes Relative: 9 %
Neutro Abs: 2.5 10*3/uL (ref 1.7–7.7)
Neutrophils Relative %: 54 %
Platelets: 190 10*3/uL (ref 150–400)
RBC: 3.39 MIL/uL — ABNORMAL LOW (ref 3.87–5.11)
RDW: 15.1 % (ref 11.5–15.5)
WBC: 4.6 10*3/uL (ref 4.0–10.5)
nRBC: 0 % (ref 0.0–0.2)

## 2023-06-16 LAB — COMPREHENSIVE METABOLIC PANEL
ALT: 399 U/L — ABNORMAL HIGH (ref 0–44)
AST: 167 U/L — ABNORMAL HIGH (ref 15–41)
Albumin: 2.8 g/dL — ABNORMAL LOW (ref 3.5–5.0)
Alkaline Phosphatase: 434 U/L — ABNORMAL HIGH (ref 38–126)
Anion gap: 9 (ref 5–15)
BUN: 45 mg/dL — ABNORMAL HIGH (ref 8–23)
CO2: 31 mmol/L (ref 22–32)
Calcium: 8.9 mg/dL (ref 8.9–10.3)
Chloride: 91 mmol/L — ABNORMAL LOW (ref 98–111)
Creatinine, Ser: 0.97 mg/dL (ref 0.44–1.00)
GFR, Estimated: >60 mL/min (ref >60)
Glucose, Bld: 93 mg/dL (ref 70–99)
Potassium: 3.3 mmol/L — ABNORMAL LOW (ref 3.5–5.1)
Sodium: 131 mmol/L — ABNORMAL LOW (ref 135–145)
Total Bilirubin: 0.5 mg/dL (ref <1.2)
Total Protein: 6.5 g/dL (ref 6.5–8.1)

## 2023-06-16 LAB — GLUCOSE, CAPILLARY
Glucose-Capillary: 125 mg/dL — ABNORMAL HIGH (ref 70–99)
Glucose-Capillary: 127 mg/dL — ABNORMAL HIGH (ref 70–99)
Glucose-Capillary: 143 mg/dL — ABNORMAL HIGH (ref 70–99)
Glucose-Capillary: 169 mg/dL — ABNORMAL HIGH (ref 70–99)
Glucose-Capillary: 231 mg/dL — ABNORMAL HIGH (ref 70–99)

## 2023-06-16 LAB — TRIGLYCERIDES: Triglycerides: 57 mg/dL (ref <150)

## 2023-06-16 LAB — PHOSPHORUS: Phosphorus: 3.3 mg/dL (ref 2.5–4.6)

## 2023-06-16 LAB — MAGNESIUM: Magnesium: 2.1 mg/dL (ref 1.7–2.4)

## 2023-06-16 MED ORDER — SODIUM CHLORIDE 0.9 % IV BOLUS
500.0000 mL | Freq: Once | INTRAVENOUS | Status: AC
  Administered 2023-06-16: 500 mL via INTRAVENOUS

## 2023-06-16 MED ORDER — POTASSIUM CHLORIDE 20 MEQ PO PACK
40.0000 meq | PACK | Freq: Once | ORAL | Status: AC
  Administered 2023-06-16: 40 meq via ORAL
  Filled 2023-06-16: qty 2

## 2023-06-16 MED ORDER — POTASSIUM CHLORIDE CRYS ER 20 MEQ PO TBCR: 40.0000 meq | EXTENDED_RELEASE_TABLET | Freq: Once | ORAL | Status: DC

## 2023-06-16 NOTE — Progress Notes (Signed)
[facility] Progress Note  SUBJECTIVE:   Interval history: [name] was seen and evaluated today at bedside. Spouse present at bedside. Noted that she had leak of her ostomy, very distressing for her. Denied abdominal pain. Denied chest pain and shortness of breath. Tolerating diet this AM, has lunch tray at bedside as well. TPN on hold yesterday, I ordered for TPN to again be held today.  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   Thyroid disease    Vertigo    Past Surgical History:  Procedure Laterality Date   ABDOMINAL ADHESION SURGERY  11/08/2020   Dr [name] - [facility]   ABDOMINAL HYSTERECTOMY  2003   [facility] - Dr [name] - for cervical cancer   BILE DUCT EXPLORATION  10/22/2003   CHOLECYSTECTOMY OPEN  10/22/2003   Dr Zachery Dakins - CBD repair w T tube   CYSTOSCOPY  2021   ILEOSTOMY  11/23/2018   ILEOSTOMY CLOSURE  04/2020   LOW ANTERIOR BOWEL RESECTION  11/23/2018   LAR/takedown colovaginal fistula/loop ileostomy  - [facility]   Current Facility-Administered Medications  Medication Dose Route Frequency Provider Last Rate Last Admin   0.9 %  sodium chloride infusion   Intravenous Continuous Glade Lloyd, MD 50 mL/hr at 06/15/23 1115 Infusion Verify at 06/15/23 1115   acetaminophen (TYLENOL) tablet 1,000 mg  1,000 mg Oral Once Fayrene Helper, PA-C       acetaminophen (TYLENOL) tablet 650 mg  650 mg Oral Q6H PRN Glade Lloyd, MD   650 mg at 06/15/23 4098   ALPRAZolam Prudy Feeler) tablet 0.5 mg  0.5 mg Oral TID PRN Glade Lloyd, MD   0.5 mg at 06/16/23 0919   Chlorhexidine Gluconate Cloth 2 % PADS 6 each  6 each Topical Daily Anthoney Harada, NP   6 each at 06/16/23 0921   enoxaparin (LOVENOX) injection 40 mg  40 mg Subcutaneous Q24H Pham, Anh P, RPH   40 mg at 06/16/23 0920   feeding supplement (ENSURE ENLIVE / ENSURE PLUS) liquid  237 mL  237 mL Oral BID BM Alekh, Kshitiz, MD   237 mL at 06/16/23 0920   haloperidol lactate (HALDOL) injection 1 mg  1 mg Intravenous Q6H PRN Glade Lloyd, MD   1 mg at 06/15/23 1434   insulin aspart (novoLOG) injection 0-9 Units  0-9 Units Subcutaneous Q6H Jamse Mead, RPH   1 Units at 06/16/23 1191   levothyroxine (SYNTHROID) tablet 25 mcg  25 mcg Oral Q0600 Tu, Ching T, DO   25 mcg at 06/16/23 4782   lip balm (CARMEX) ointment   Topical PRN Benita Gutter T, DO   1 Application at 06/10/23 0540   LORazepam (ATIVAN) injection 1 mg  1 mg Intravenous Once Glade Lloyd, MD       mirtazapine (REMERON) tablet 7.5 mg  7.5 mg Oral QHS Tu, Ching T, DO   7.5 mg at 06/15/23 2100   ondansetron (ZOFRAN) injection 4 mg  4 mg Intravenous Q6H PRN Tu, Ching T, DO   4 mg at 06/15/23 0411   Oral care mouth rinse  15 mL Mouth Rinse PRN Glade Lloyd, MD       pantoprazole (PROTONIX) EC tablet 40 mg  40 mg Oral Daily Tu, Ching T, DO   40 mg at 06/16/23 0920  sodium chloride flush (NS) 0.9 % injection 10-40 mL  10-40 mL Intracatheter Q12H Alekh, Kshitiz, MD   10 mL at 06/16/23 0920   thiamine (VITAMIN B1) tablet 100 mg  100 mg Oral Daily Glade Lloyd, MD   100 mg at 06/16/23 0920   Allergies as of 06/09/2023 - Review Complete 06/09/2023  Allergen Reaction Noted   Barium-containing compounds  03/17/2019   Percocet [oxycodone-acetaminophen] Itching and Other (See Comments) 03/04/2014   Review of Systems:  Review of Systems  Respiratory:  Negative for shortness of breath.   Cardiovascular:  Negative for chest pain.  Gastrointestinal:  Positive for nausea (Some this AM). Negative for abdominal pain and vomiting.    OBJECTIVE:   Temp:  [97.6 F (36.4 C)-97.8 F (36.6 C)] 97.8 F (36.6 C) (12/30 0531) Pulse Rate:  [89-98] 89 (12/30 0531) Resp:  [20] 20 (12/30 0531) BP: (88-98)/(65-68) 88/68 (12/30 0531) SpO2:  [100 %] 100 % (12/30 0531) Weight:  [46.6 kg] 46.6 kg (12/30 0500) Last BM Date :  06/15/23 Physical Exam Constitutional:      General: She is not in acute distress.    Appearance: She is not ill-appearing, toxic-appearing or diaphoretic.  Cardiovascular:     Rate and Rhythm: Normal rate and regular rhythm.  Pulmonary:     Effort: No respiratory distress.     Breath sounds: Normal breath sounds.  Abdominal:     General: Bowel sounds are normal. There is no distension.     Palpations: Abdomen is soft.     Tenderness: There is no abdominal tenderness. There is no guarding.     Comments: Ostomy appreciated RLQ, non-bloody contents noted.  Musculoskeletal:     Right lower leg: No edema.     Left lower leg: No edema.  Skin:    General: Skin is warm and dry.  Neurological:     Mental Status: She is alert.     Labs: Recent Labs    06/16/23 0308  WBC 4.6  HGB 10.4*  HCT 30.6*  PLT 190   BMET Recent Labs    06/14/23 0352 06/15/23 0328 06/16/23 0308  NA 134* 130* 131*  K 3.0* 3.5 3.3*  CL 96* 90* 91*  CO2 30 30 31   GLUCOSE 92 96 93  BUN 48* 53* 45*  CREATININE 0.77 0.82 0.97  CALCIUM 8.7* 9.2 8.9   LFT Recent Labs    06/16/23 0308  PROT 6.5  ALBUMIN 2.8*  AST 167*  ALT 399*  ALKPHOS 434*  BILITOT 0.5   PT/INR No results for input(s): "LABPROT", "INR" in the last 72 hours. Diagnostic imaging: No results found.  IMPRESSION: Transaminase elevation in mixed pattern  -Etiology undetermined, TPN vs medication induced (cephtriaxone started on 06/11/23 for UTI) History cervical cancer 2003  -Treated with pelvic radiation therapy c/b colovaginal fistula repaired in 2019 with loop ileostomy -Required redo low anterior resection with fistula repair and end-to-end sigmoidrectal anastomosis 11/23/2018 Adhesions secondary to previous abdominal surgeries  -S/p ex-lap with lysis of adhesions, distal ileum and cecum resection with end ileostomy creation on 03/14/23 Small bowel obstruction May 2022 requiring lysis of adhesions, small bowel obstruction June  2024 treated medically 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 given intractable nausea and emesis  PLAN: -Transaminase elevation undetermined at this time, question relation to TPN (on hold 06/15/23 and again today on 06/16/23), mild improvement in transaminase level -In review of medications, ceftriaxone was started on 06/11/23 which correlates with increase in transaminase level -Continue to trend liver  enzyme panel  -Patient appears to be tolerating some oral intake, could consider calorie count with nutrition therapy to determine need for further TPN as intractable nausea/emesis appear to be improved  -[facility] will follow   LOS: 6 days   Liliane Shi, [facility]

## 2023-06-16 NOTE — Progress Notes (Signed)
PROGRESS NOTE    [name]  UEA:[id number] DOB: 05-27-1956 DOA: 06/09/2023 PCP: Patient, [name]   Brief Narrative:  This 67 yrs old female with medical history significant of cervical cancer and rectovaginal fistula s/p redo low anterior resection with colovaginal fistula takedown, fistula repair, and hand sewn end to end sigmoidrectal anastomosis with loop ileostomy on 11/23/18; closure of loop ileostomy 05/01/20, SBO s/p ex-lap with lysis of adhesions on 11/08/20, recurrent SBO s/p ex-lap, LOA, distal ileum and cecum resection with end ileostomy creation on 03/14/2023 started on TPN, right hydronephrosis s/p R ureteral stent placement on 04/02/2023 with subsequent obstructive uropathy, UTI, AKI and bilateral hydronephrosis with placement of right nephrostomy tube on 05/08/2023 presented with nausea, vomiting and hyponatremia from SNF.  Patient has been on TPN recently after her recent hospitalization at [facility] and discharged to SNF.  On presentation, she was tachypneic with sodium of 121 (last sodium on 05/24/2023 in care everywhere was 130).  Creatinine was 2.07 (last creatinine was 1.09 on 05/24/2023) with bicarb of 18.  CT of abdomen and pelvis did not show SBO but showed moderate to marked severity right-sided hydronephrosis and proximal hydroureter with percutaneous right-sided nephrostomy tube and right sided intraureteral stent in place along with mild to moderate severity left-sided hydronephrosis and proximal hydroureter with markedly dilated intrarenal calyces on the left.  She was started on IV fluids.  TPN was resumed.  She was started on IV antibiotics for possible UTI.  Urology was also consulted and recommended conservative management and outpatient follow-up.  PT recommending SNF placement.  TOC following.   Assessment & Plan:   Principal Problem:   Hyponatremia Active Problems:   Acute-on-chronic kidney injury (HCC)   Hypothyroidism   Mixed  hyperlipidemia   Failure to thrive in adult   Nausea & vomiting   Transaminitis   Metabolic acidosis with normal anion gap and bicarbonate losses   Bilateral hydronephrosis   Tremors of nervous system   Hyponatremia -Possibly due to decreased oral intake and likely GI loss. -Sodium 121 on presentation.  Sodium 131 this morning.   Continue IV fluids as below.  Encourage oral intake.  DC'd sertraline.   AKI on CKD stage IIIa: > Resolved. Acute metabolic acidosis > Resolved. Possible UTI: POA  History of bilateral hydronephrosis status post ureteral stent and right nephrostomy tube. Patient presented with creatinine of 2.07, last creatinine was 1.09 on 05/24/2023.   AKI resolved.  Metabolic acidosis resolved. Dr. Hanley Ben has discussed with Dr. Borden/urology on 06/10/2023: He reviewed prior records, notes and imaging and stated that patient has an almost atrophic/nonfunctional left kidney with possible chronic hydronephrosis and patient would not need any procedure for left hydronephrosis.  As long as right nephrostomy tube is draining and kidney function is improving, Patient will need to follow-up with urology as an outpatient. -Urology evaluation by Dr. Sherron Monday on 06/13/2023 appreciated: Recommended to continue nephrostomy tube for now and outpatient follow-up with regular urologist in [facility]. -UA suggestive of possible UTI.  Completed 5 days of Rocephin.  Urine culture grew multiple species.   Hypothyroidism: Continue levothyroxine   Hyperlipidemia: Hold statin due to transaminitis   Transaminitis: -LFTs continue to trend upwards.  CT imaging showed no biliary dilatation.   TPN discontinued.  Will start gentle hydration with normal saline.  GI agreed.   Nausea and vomiting -Has been an ongoing chronic issue for her due to her complex abdominal surgical history.  No small bowel obstruction  demonstrated on CT imaging on presentation.  Continue as needed antiemetics.  Currently  improving.  Outpatient follow-up with primary surgical team   Failure to thrive in adult: --Pt has extensive abdominal surgery due to hx of cervical cancer and currently on TPN , on hold. -Palliative care evaluation appreciated.  Patient remains full code.   History of cervical cancer and rectovaginal fistula status post surgical intervention -Currently has an ileostomy.  Outpatient follow-up with oncology/GYN oncology/general surgery   Anxiety/depression: -Continue mirtazapine along with as needed Xanax. -Hold Zoloft in the setting of hyponatremia   Physical deconditioning -Patient from SNF.  PT recommending SNF placement.  TOC following   DVT prophylaxis: Lovenox Code Status: Full code Family Communication: No one at bedside Disposition Plan:    Status is: Inpatient Remains inpatient appropriate because: Severity of illness     Consultants:  Neurology Gastroenterology Palliative care  Procedures: None Antimicrobials:  Anti-infectives (From admission, onward)    Start     Dose/Rate Route Frequency Ordered Stop   06/11/23 1200  cefTRIAXone (ROCEPHIN) 1 g in sodium chloride 0.9 % 100 mL IVPB        1 g 200 mL/hr over 30 Minutes Intravenous Every 24 hours 06/11/23 1154 06/15/23 1316      Subjective: Patient was seen and examined at bedside.  Overnight events noted.   Patient reports doing better.  TPN has been discontinued due to increase in LFTs.  Objective: Vitals:   06/15/23 0612 06/15/23 2116 06/16/23 0500 06/16/23 0531  BP: 94/62 98/65  (!) 88/68  Pulse: 88 98  89  Resp: 16 20  20   Temp: 97.6 F (36.4 C) 97.6 F (36.4 C)  97.8 F (36.6 C)  TempSrc: Oral Oral  Oral  SpO2: 100% 100%  100%  Weight:   46.6 kg   Height:        Intake/Output Summary (Last 24 hours) at 06/16/2023 1425 Last data filed at 06/16/2023 1105 Gross per 24 hour  Intake 150 ml  Output 2100 ml  Net -1950 ml   Filed Weights   06/11/23 0457 06/13/23 0500 06/16/23 0500  Weight:  50.1 kg 50.2 kg 46.6 kg    Examination:  General exam: Appears calm and comfortable, deconditioned, not in any acute distress. Respiratory system: Clear to auscultation. Respiratory effort normal.  RR 16 Cardiovascular system: S1 & S2 heard, RRR. No JVD, murmurs, rubs, gallops or clicks.  Gastrointestinal system: Abdomen is non distended, soft and non tender. Normal bowel sounds heard. Central nervous system: Alert and oriented x 3. No focal neurological deficits. Extremities:  No Edema, no cyanosis, no clubbing Skin: No rashes, lesions or ulcers Psychiatry: Judgement and insight appear normal. Mood & affect appropriate.     Data Reviewed: I have personally reviewed following labs and imaging studies  CBC: Recent Labs  Lab 06/09/23 2031 06/10/23 0419 06/11/23 0340 06/12/23 0301 06/13/23 0331 06/16/23 0308  WBC 9.1 7.3 6.5 7.3 5.9 4.6  NEUTROABS 8.3*  --  4.9 4.2 4.2 2.5  HGB 12.9 12.2 10.7* 10.1* 10.2* 10.4*  HCT 36.0 36.1 31.6* 29.8* 29.4* 30.6*  MCV 82.9 87.8 88.8 88.7 87.8 90.3  PLT 284 217 195 217 202 190   Basic Metabolic Panel: Recent Labs  Lab 06/12/23 0301 06/13/23 0331 06/14/23 0352 06/15/23 0328 06/16/23 0308  NA 128* 130* 134* 130* 131*  K 4.1 3.6 3.0* 3.5 3.3*  CL 105 102 96* 90* 91*  CO2 16* 19* 30 30 31   GLUCOSE 84 136* 92  96 93  BUN 68* 60* 48* 53* 45*  CREATININE 0.94 0.88 0.77 0.82 0.97  CALCIUM 9.0 9.1 8.7* 9.2 8.9  MG 2.0 1.7 2.3 2.4 2.1  PHOS 2.4* 3.2 2.5 2.7 3.3   GFR: Estimated Creatinine Clearance: 41.4 mL/min (by C-G formula based on SCr of 0.97 mg/dL). Liver Function Tests: Recent Labs  Lab 06/12/23 0301 06/13/23 0331 06/14/23 0352 06/15/23 0328 06/16/23 0308  AST 112* 162* 145* 234* 167*  ALT 153* 259* 302* 443* 399*  ALKPHOS 258* 296* 289* 378* 434*  BILITOT 0.3 0.5 0.4 0.4 0.5  PROT 6.4* 6.5 5.9* 6.2* 6.5  ALBUMIN 2.9* 2.8* 2.4* 2.8* 2.8*   Recent Labs  Lab 06/09/23 2031  LIPASE 48   No results for input(s):  "AMMONIA" in the last 168 hours. Coagulation Profile: No results for input(s): "INR", "PROTIME" in the last 168 hours. Cardiac Enzymes: No results for input(s): "CKTOTAL", "CKMB", "CKMBINDEX", "TROPONINI" in the last 168 hours. BNP (last 3 results) No results for input(s): "PROBNP" in the last 8760 hours. HbA1C: No results for input(s): "HGBA1C" in the last 72 hours. CBG: Recent Labs  Lab 06/15/23 1726 06/15/23 2339 06/16/23 0528 06/16/23 0734 06/16/23 1224  GLUCAP 257* 85 143* 127* 231*   Lipid Profile: Recent Labs    06/16/23 0308  TRIG 57   Thyroid Function Tests: No results for input(s): "TSH", "T4TOTAL", "FREET4", "T3FREE", "THYROIDAB" in the last 72 hours. Anemia Panel: No results for input(s): "VITAMINB12", "FOLATE", "FERRITIN", "TIBC", "IRON", "RETICCTPCT" in the last 72 hours. Sepsis Labs: No results for input(s): "PROCALCITON", "LATICACIDVEN" in the last 168 hours.  Recent Results (from the past 240 hours)  Resp panel by RT-PCR (RSV, Flu A&B, Covid) Anterior Nasal Swab     Status: None   Collection Time: 06/09/23  8:47 PM   Specimen: Anterior Nasal Swab  Result Value Ref Range Status   SARS Coronavirus 2 by RT PCR NEGATIVE NEGATIVE Final    Comment: (NOTE) SARS-CoV-2 target nucleic acids are NOT DETECTED.  The SARS-CoV-2 RNA is generally detectable in upper respiratory specimens during the acute phase of infection. The lowest concentration of SARS-CoV-2 viral copies this assay can detect is 138 copies/mL. A negative result does not preclude SARS-Cov-2 infection and should not be used as the sole basis for treatment or other patient management decisions. A negative result may occur with  improper specimen collection/handling, submission of specimen other than nasopharyngeal swab, presence of viral mutation(s) within the areas targeted by this assay, and inadequate number of viral copies(<138 copies/mL). A negative result must be combined with clinical  observations, patient history, and epidemiological information. The expected result is Negative.  Fact Sheet for Patients:  BloggerCourse.com  Fact Sheet for Healthcare Providers:  SeriousBroker.it  This test is no t yet approved or cleared by the [place] FDA and  has been authorized for detection and/or diagnosis of SARS-CoV-2 by FDA under an Emergency Use Authorization (EUA). This EUA will remain  in effect (meaning this test can be used) for the duration of the COVID-19 declaration under Section 564(b)(1) of the Act, 21 U.S.C.section 360bbb-3(b)(1), unless the authorization is terminated  or revoked sooner.       Influenza A by PCR NEGATIVE NEGATIVE Final   Influenza B by PCR NEGATIVE NEGATIVE Final    Comment: (NOTE) The Xpert Xpress SARS-CoV-2/FLU/RSV plus assay is intended as an aid in the diagnosis of influenza from Nasopharyngeal swab specimens and should not be used as a sole basis for treatment. Nasal  washings and aspirates are unacceptable for Xpert Xpress SARS-CoV-2/FLU/RSV testing.  Fact Sheet for Patients: BloggerCourse.com  Fact Sheet for Healthcare Providers: SeriousBroker.it  This test is not yet approved or cleared by the [place] FDA and has been authorized for detection and/or diagnosis of SARS-CoV-2 by FDA under an Emergency Use Authorization (EUA). This EUA will remain in effect (meaning this test can be used) for the duration of the COVID-19 declaration under Section 564(b)(1) of the Act, 21 U.S.C. section 360bbb-3(b)(1), unless the authorization is terminated or revoked.     Resp Syncytial Virus by PCR NEGATIVE NEGATIVE Final    Comment: (NOTE) Fact Sheet for Patients: BloggerCourse.com  Fact Sheet for Healthcare Providers: SeriousBroker.it  This test is not yet approved or cleared by  the [place] FDA and has been authorized for detection and/or diagnosis of SARS-CoV-2 by FDA under an Emergency Use Authorization (EUA). This EUA will remain in effect (meaning this test can be used) for the duration of the COVID-19 declaration under Section 564(b)(1) of the Act, 21 U.S.C. section 360bbb-3(b)(1), unless the authorization is terminated or revoked.  Performed at [facility], [address], [place], [place] [address]   MRSA Next Gen by PCR, Nasal     Status: Abnormal   Collection Time: 06/10/23  2:30 AM   Specimen: Nasal Mucosa; Nasal Swab  Result Value Ref Range Status   MRSA by PCR Next Gen DETECTED (A) NOT DETECTED Final    Comment: (NOTE) The GeneXpert MRSA Assay (FDA approved for NASAL specimens only), is one component of a comprehensive MRSA colonization surveillance program. It is not intended to diagnose MRSA infection nor to guide or monitor treatment for MRSA infections. Test performance is not FDA approved in patients less than 71 years old. Performed at [facility], [address], [place], [place] [address]   Urine Culture (for pregnant, neutropenic or urologic patients or patients with an indwelling urinary catheter)     Status: Abnormal   Collection Time: 06/10/23  6:00 PM   Specimen: Urine, Bag (ped)  Result Value Ref Range Status   Specimen Description   Final    URINE, BAG PED Performed at [facility], [address], [place], [place] [address]    Special Requests   Final    NONE Performed at [facility], [address] [place], [place] [address]    Culture MULTIPLE SPECIES PRESENT, SUGGEST RECOLLECTION (A)  Final   Report Status 06/12/2023 FINAL  Final    Radiology Studies: No results found.  Scheduled Meds:  acetaminophen  1,000 mg Oral Once   Chlorhexidine Gluconate Cloth  6 each Topical Daily   enoxaparin (LOVENOX) injection  40 mg Subcutaneous Q24H    feeding supplement  237 mL Oral BID BM   insulin aspart  0-9 Units Subcutaneous Q6H   levothyroxine  25 mcg Oral Q0600   LORazepam  1 mg Intravenous Once   mirtazapine  7.5 mg Oral QHS   pantoprazole  40 mg Oral Daily   sodium chloride flush  10-40 mL Intracatheter Q12H   thiamine  100 mg Oral Daily   Continuous Infusions:  sodium chloride 50 mL/hr at 06/15/23 1115     LOS: 6 days    Time spent: 50 mins    Willeen Niece, MD [facility]   If 7PM-7AM, please contact night-coverage

## 2023-06-16 NOTE — Progress Notes (Signed)
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   Assessment:  67 y/o F with PMH of cervical cancer and rectovaginal fistula s/p redo low anterior resection with colovaginal fistula takedown, fistula repair, and hand sewn end to end sigmoidrectal anastomosis with loop ileostomy on 11/23/18; closure of loop ileostomy 05/01/20, SBO s/p ex-lap with lysis of adhesions on 11/08/20, recurrent SBO s/p ex-lap, LOA, distal ileum and cecum resection with end ileostomy creation on 03/14/2023 started on TPN, right hydronephrosis s/p R ureteral stent placement on 04/02/2023 with subsequent obstructive uropathy, UTI, AKI and bilateral hydronephrosis with placement of right nephrostomy tube on 05/08/2023 who presents with nausea/vomiting and hyponatremia. Pharmacy consulted to resume TPN on admission.   Glucose / Insulin:  No history of DM.  - on sSSI (used 9 units in the past 24 hrs) - CBGs last 24hr (goal <180): 85-243 (two readings >200 occurred on 12/29) Electrolytes: Na 131, K low 3.3 - other lytes wnl  Renal: scr <1, BUN down to 45 Hepatic: note hx transaminitis in PMH  - AST/ALT elevated but down 167/399 (Depakote d/ced om 12/28) - Alk phos elevated and trending up to 434 - Tbili wnl  - TG trending down 57 (12/30) - Albumin low 2.8 Intake / Output; MIVF: I/O -1331 mL.  - MIVF: NS @ 50 mL/hr - UOP: 0.4 ml/kg/hr - Stool output: 1900 mL GI Imaging:  - 12/23 CT abdomen and pelvis: did not show SBO but showed moderate to marked severity right-sided hydronephrosis and proximal hydroureter with percutaneous right-sided nephrostomy tube and right sided intraureteral stent in place along with mild to moderate severity left-sided hydronephrosis and  proximal hydroureter with markedly dilated intrarenal calyces on the left.  GI Surgeries / Procedures: see above  Central access: PICC line in place TPN start date: continued from PTA  - 12/29: per MD's request, TPN stopped at 11a due to elevated LFTs.   Nutritional Goals: Due to the Baxter IV fluid disruption, all adult parenteral nutrition will utilize premade Clinimix products +/- fat emulsion infusion. Our goal will be to continue providing as close to 100% of our patient's nutritional needs, but due to limited TPN Clinimix concentrations we will meet at least 80% of protein and 75% of kcal goals.  Per discussion with [employer] [facility] [employer], patient was receiving Clinimix E 5/15 1000 mL over 24 hours and Intralipid 20% 250 mL over 12 hours while at facility   -Goal TPN rate (Clinimix 8/10) is 42 mL/hr (provides 80g of protein and 1164 kcals per day) - meets 100% of protein goal and 78% of kcal goals with TPN alone  -Also, each Ensure Plus supplement (ordered BID) provides 350 kcal and 20g of protein     RD Assessment: Estimated Needs Total Energy Estimated Needs: 1500-1700 Total Protein Estimated Needs: 75-90g Total Fluid Estimated Needs: 1.7L/day  Current Nutrition:  Regular diet Ensure Plus BID TPN to resume this PM  Plan:   Now: - KDur 40 mEq PO x1  - TPN stopped on 12/29 per recom from GI d/t elevated LFTs - Per Dr. Lorenso Quarry via [facility] msg on 12/30, "continue to hold TPN for today."    Dorna Leitz, PharmD, BCPS 06/16/2023 6:58 AM

## 2023-06-16 NOTE — Progress Notes (Addendum)
Physical Therapy Treatment Patient Details Name: [name] MRN: [id number] DOB: 11/06/1955 Today's Date: 06/16/2023   History of Present Illness Pt is a 67 year old woman admitted 12/23 from [facility], where she was receiving rehab, with hyponatremia and AKI associated with nausea and vomiting. PMH: cervical cancer, colovaginal fistula, multiple abdominal surgeries, recent nephrostomy due to hydronephrosis, CKD, anxiety, headaches, UTI.    PT Comments  Pt agitated/easily irritable.Husband present to encourage participation. Mood waxed and waned throughout session. Pt is generally unsafe when mobilizing-fall risk. She c/o some dizziness towards end of session-unsure if this was actually occurring or if pt needed a reason to get back into bed.  Assisted pt back to bed at her request.    If plan is discharge home, recommend the following: A little help with walking and/or transfers;A little help with bathing/dressing/bathroom;Assistance with cooking/housework;Assist for transportation;Help with stairs or ramp for entrance   Can travel by private vehicle     No  Equipment Recommendations  None recommended by PT    Recommendations for Other Services       Precautions / Restrictions Precautions Precautions: Fall Precaution Comments: R ileostomy, R nephrostomy drain Restrictions Weight Bearing Restrictions Per Provider Order: No     Mobility  Bed Mobility Overal bed mobility: Needs Assistance Bed Mobility: Supine to Sit, Sit to Supine     Supine to sit: Contact guard Sit to supine: Min assist   General bed mobility comments: Impulsively sits up at EOB( little regard for lines/drains/safety). Assist for LEs back onto bed.    Transfers Overall transfer level: Needs assistance Equipment used: Rolling walker (2 wheels) Transfers: Sit to/from Stand Sit to Stand: Min assist           General transfer comment: Little regard for lines/drains/safety. Cues for  safety, hand placement. Assist to steady.    Ambulation/Gait Ambulation/Gait assistance: Min assist Gait Distance (Feet): 15 Feet (x2) Assistive device: Rolling walker (2 wheels) Gait Pattern/deviations: Step-through pattern, Decreased stride length       General Gait Details: Unsteady. Little regard for lines/drains/safety. Assist to stabilize pt throughout distance and to safely manage RW. Cues for safety, proper use of RW. Fall risk.   Stairs             Wheelchair Mobility     Tilt Bed    Modified Rankin (Stroke Patients Only)       Balance Overall balance assessment: Needs assistance         Standing balance support: Bilateral upper extremity supported, During functional activity, Reliant on assistive device for balance Standing balance-Leahy Scale: Poor                              Cognition Arousal: Alert Behavior During Therapy: Agitated, Lability, Impulsive Overall Cognitive Status: Impaired/Different from baseline Area of Impairment: Orientation, Attention, Memory, Following commands, Safety/judgement, Awareness, Problem solving                 Orientation Level: Situation, Time Current Attention Level: Focused Memory: Decreased short-term memory, Decreased recall of precautions Following Commands: Follows one step commands with increased time Safety/Judgement: Decreased awareness of safety, Decreased awareness of deficits   Problem Solving: Requires verbal cues, Requires tactile cues          Exercises      General Comments        Pertinent Vitals/Pain Pain Assessment Pain Assessment: Faces Faces Pain Scale: Hurts a  little bit Pain Location: drain site/back Pain Descriptors / Indicators: Discomfort Pain Intervention(s): Repositioned    Home Living                          Prior Function            PT Goals (current goals can now be found in the care plan section) Progress towards PT goals:  Progressing toward goals    Frequency    Min 1X/week      PT Plan      Co-evaluation              AM-PAC PT "6 Clicks" Mobility   Outcome Measure  Help needed turning from your back to your side while in a flat bed without using bedrails?: A Little Help needed moving from lying on your back to sitting on the side of a flat bed without using bedrails?: A Little Help needed moving to and from a bed to a chair (including a wheelchair)?: A Little Help needed standing up from a chair using your arms (e.g., wheelchair or bedside chair)?: A Little Help needed to walk in hospital room?: A Lot Help needed climbing 3-5 steps with a railing? : Total 6 Click Score: 15    End of Session Equipment Utilized During Treatment: Gait belt Activity Tolerance: Patient limited by fatigue Patient left: in bed;with call bell/phone within reach;with bed alarm set;with family/visitor present   PT Visit Diagnosis: Unsteadiness on feet (R26.81);Muscle weakness (generalized) (M62.81);Difficulty in walking, not elsewhere classified (R26.2)     Time: 0981-1914 PT Time Calculation (min) (ACUTE ONLY): 24 min  Charges:    $Gait Training: 23-37 mins PT General Charges $$ ACUTE PT VISIT: 1 Visit                        Faye Ramsay, PT Acute Rehabilitation  Office: [phone]

## 2023-06-16 NOTE — Progress Notes (Signed)
Patient irritable and redirectable. RN notified by sec, patient has been calling the police from personal phone. At RN round, patient she was tearful and refusing to eat breakfast. RN Will continue to monitor.

## 2023-06-16 NOTE — Consult Note (Signed)
[facility] Liaison Note  06/16/2023  [name] 1956-03-18 [id number]  Location: RN Hospital Liaison screened the patient remotely at [facility].  Insurance: [employer]   [name] is a 67 y.o. female who is a Primary Care Patient of Patient, [name] The patient was screened for readmission hospitalization with noted high risk score for unplanned readmission risk with 1 IP/2 ED in 6 months.  The patient was assessed for potential Care Management service needs for post hospital transition for care coordination. Review of patient's electronic medical record reveals patient was admitted for Hyponatremia. Pt recommended for SNF level of care, pending bedsearch. Liaison spoke with pt and introduced VBCI care management services. Stressed the importance of establishing a PCP for her ongoing care. Discussed the recent cancellation with her pending PCP [name], NP with [facility] at [facility]. Encouraged pt to re-establish a pending post hospital follow up appointment with a provider of choice (receptive). Current recommendations for SNF level of care. If pt is discharged to a SNF the facility will continue to accommodate pt's needs. No further VBCI services needed at this time as pt pending establishment of a PCP post hospital discharge.   VBCI Care Management/Population Health does not replace or interfere with any arrangements made by the Inpatient Transition of Care team.   For questions contact:   Elliot Cousin, RN, [facility] Liaison [place]   [facility], Population Health Office Hours MTWF  8:00 am-6:00 pm Direct Dial: [phone] mobile [phone] [Office toll free line] Office Hours are M-F 8:30 - 5 pm [email or web address]

## 2023-06-17 DIAGNOSIS — E871 Hypo-osmolality and hyponatremia: Secondary | ICD-10-CM | POA: Diagnosis not present

## 2023-06-17 LAB — CBC
HCT: 33.9 % — ABNORMAL LOW (ref 36.0–46.0)
Hemoglobin: 10.7 g/dL — ABNORMAL LOW (ref 12.0–15.0)
MCH: 28.8 pg (ref 26.0–34.0)
MCHC: 31.6 g/dL (ref 30.0–36.0)
MCV: 91.4 fL (ref 80.0–100.0)
Platelets: 203 10*3/uL (ref 150–400)
RBC: 3.71 MIL/uL — ABNORMAL LOW (ref 3.87–5.11)
RDW: 14.8 % (ref 11.5–15.5)
WBC: 4.5 10*3/uL (ref 4.0–10.5)
nRBC: 0 % (ref 0.0–0.2)

## 2023-06-17 LAB — COMPREHENSIVE METABOLIC PANEL
ALT: 469 U/L — ABNORMAL HIGH (ref 0–44)
AST: 223 U/L — ABNORMAL HIGH (ref 15–41)
Albumin: 2.9 g/dL — ABNORMAL LOW (ref 3.5–5.0)
Alkaline Phosphatase: 518 U/L — ABNORMAL HIGH (ref 38–126)
Anion gap: 12 (ref 5–15)
BUN: 40 mg/dL — ABNORMAL HIGH (ref 8–23)
CO2: 28 mmol/L (ref 22–32)
Calcium: 8.7 mg/dL — ABNORMAL LOW (ref 8.9–10.3)
Chloride: 88 mmol/L — ABNORMAL LOW (ref 98–111)
Creatinine, Ser: 1.21 mg/dL — ABNORMAL HIGH (ref 0.44–1.00)
GFR, Estimated: 49 mL/min — ABNORMAL LOW (ref >60)
Glucose, Bld: 98 mg/dL (ref 70–99)
Potassium: 3.1 mmol/L — ABNORMAL LOW (ref 3.5–5.1)
Sodium: 128 mmol/L — ABNORMAL LOW (ref 135–145)
Total Bilirubin: 0.7 mg/dL (ref 0.0–1.2)
Total Protein: 6.8 g/dL (ref 6.5–8.1)

## 2023-06-17 LAB — HEPATITIS PANEL, ACUTE
HCV Ab: NONREACTIVE
Hep A IgM: NONREACTIVE
Hep B C IgM: NONREACTIVE
Hepatitis B Surface Ag: NONREACTIVE

## 2023-06-17 LAB — GLUCOSE, CAPILLARY
Glucose-Capillary: 170 mg/dL — ABNORMAL HIGH (ref 70–99)
Glucose-Capillary: 184 mg/dL — ABNORMAL HIGH (ref 70–99)
Glucose-Capillary: 208 mg/dL — ABNORMAL HIGH (ref 70–99)
Glucose-Capillary: 97 mg/dL (ref 70–99)

## 2023-06-17 LAB — MAGNESIUM: Magnesium: 1.9 mg/dL (ref 1.7–2.4)

## 2023-06-17 LAB — PHOSPHORUS: Phosphorus: 3.3 mg/dL (ref 2.5–4.6)

## 2023-06-17 MED ORDER — TRACE MINERALS CU-MN-SE-ZN 300-55-60-3000 MCG/ML IV SOLN
INTRAVENOUS | Status: AC
  Filled 2023-06-17 (×2): qty 1000

## 2023-06-17 MED ORDER — FAT EMUL FISH OIL/PLANT BASED 20% (SMOFLIPID)IV EMUL
250.0000 mL | INTRAVENOUS | Status: AC
  Administered 2023-06-17: 250 mL via INTRAVENOUS
  Filled 2023-06-17: qty 250

## 2023-06-17 MED ORDER — POTASSIUM CHLORIDE CRYS ER 20 MEQ PO TBCR
40.0000 meq | EXTENDED_RELEASE_TABLET | Freq: Once | ORAL | Status: AC
  Administered 2023-06-17: 40 meq via ORAL
  Filled 2023-06-17: qty 2

## 2023-06-17 MED ORDER — POTASSIUM CHLORIDE 20 MEQ PO PACK: 20.0000 meq | PACK | Freq: Once | ORAL | Status: DC

## 2023-06-17 MED ORDER — POTASSIUM CHLORIDE 20 MEQ PO PACK
40.0000 meq | PACK | Freq: Once | ORAL | Status: AC
  Administered 2023-06-17: 40 meq via ORAL
  Filled 2023-06-17: qty 2

## 2023-06-17 NOTE — Progress Notes (Signed)
 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   Assessment:  67 y/o F with PMH of cervical cancer and rectovaginal fistula s/p redo low anterior resection with colovaginal fistula takedown, fistula repair, and hand sewn end to end sigmoidrectal anastomosis with loop ileostomy on 11/23/18; closure of loop ileostomy 05/01/20, SBO s/p ex-lap with lysis of adhesions on 11/08/20, recurrent SBO s/p ex-lap, LOA, distal ileum and cecum resection with end ileostomy creation on 03/14/2023 started on TPN, right hydronephrosis s/p R ureteral stent placement on 04/02/2023 with subsequent obstructive uropathy, UTI, AKI and bilateral hydronephrosis with placement of right nephrostomy tube on 05/08/2023 who presents with nausea/vomiting and hyponatremia. Pharmacy consulted to resume TPN on admission.   Glucose / Insulin :  No history of DM.  - on sSSI (used 7 units in the past 24 hrs) - CBGs last 24hr (goal <180): 97-231 (only one reading >180) Electrolytes: Na 128, K low 3.1, CL low 88,  - other lytes wnl including CorrCa Renal: scr up 1.21, BUN down to 40 Hepatic: note hx transaminitis in PMH  - AST/ALT up 233-469 (Depakote  d/ced om 12/28). GI team now suspects elevated LFTs is from course of ceftriaxone   - Alk phos elevated and trending up to 518 - Tbili wnl  - TG trending down 57 (12/30) - Albumin  low 2.9 Intake / Output; MIVF: I/O +322 mL.  - MIVF: NS @ 50 mL/hr - UOP: 0.2 ml/kg/hr - Stool output: 1450 mL GI Imaging:  - 12/23 CT abdomen and pelvis: did not show SBO but showed moderate to marked severity right-sided hydronephrosis and proximal hydroureter with percutaneous right-sided nephrostomy tube and right sided intraureteral stent in  place along with mild to moderate severity left-sided hydronephrosis and proximal hydroureter with markedly dilated intrarenal calyces on the left.  GI Surgeries / Procedures: see above  Central access: PICC line in place TPN start date: continued from PTA  - 12/29: per MD's request, TPN stopped at 11a due to elevated LFTs.  - 12/30: Per Dr. Kriss, continue to hold TPN.  - 12/31: Per Dr. Kriss, resume TPN back today   Nutritional Goals: Due to the Baxter IV fluid disruption, all adult parenteral nutrition will utilize premade Clinimix  products +/- fat emulsion infusion. Our goal will be to continue providing as close to 100% of our patient's nutritional needs, but due to limited TPN Clinimix  concentrations we will meet at least 80% of protein and 75% of kcal goals.  Per discussion with [employer] [facility] [employer], patient was receiving Clinimix  E 5/15 1000 mL over 24 hours and Intralipid 20% 250 mL over 12 hours while at facility   -Goal TPN rate (Clinimix  8/10) is 42 mL/hr (provides 80g of protein and 1164 kcals per day) - meets 100% of protein goal and 78% of kcal goals with TPN alone  -Also, each Ensure Plus supplement (ordered BID) provides 350 kcal and 20g of protein     RD Assessment: Estimated Needs Total Energy Estimated Needs: 1500-1700 Total Protein Estimated Needs: 75-90g Total Fluid Estimated Needs: 1.7L/day  Current Nutrition:  Regular diet Ensure Plus BID TPN to resume this PM  Plan:   Now: - KDur 60 mEq PO x1 total (40 meq + 20 meq)   -  Per Dr. Kriss via [facility] msg, ok to resume TPN back today (06/17/23)  At 1800: - start Clinimix  E 8/10 at 76mL/hr at 1800 - Electrolytes in TPN (fixed; unable to adjust in premixed Clinimix ): Na 35mEq/L, K 25mEq/L, Ca 32mEq/L, Mg 77mEq/L, Phos 15mmol/L, Acetate 83 mEq/L, Cl 76 mEq/L - Add standard MVI and trace elements to TPN - SMOFlipid  20% 250mL IV over 12 hours.  - Continue  Sensitive q6h SSI for now -  Thiamine  100mg  PO daily per RD recommendations - MIVF management per MD - Monitor TPN labs on Mon/Thurs and prn. CMET, phos and Mag on 06/18/23   Iantha Batch, PharmD, BCPS 06/17/2023 7:25 AM

## 2023-06-17 NOTE — Progress Notes (Signed)
 Mobility Specialist - Progress Note   06/17/23 1131  Mobility  Activity Ambulated independently in hallway  Level of Assistance Independent  Assistive Device None  Distance Ambulated (ft) 160 ft  Activity Response Tolerated well  Mobility Referral Yes  Mobility visit 1 Mobility  Mobility Specialist Start Time (ACUTE ONLY) 1115  Mobility Specialist Stop Time (ACUTE ONLY) 1131  Mobility Specialist Time Calculation (min) (ACUTE ONLY) 16 min   Pt received in bed and agreeable to mobility. C/o headache during session. RN made aware. No other complaints during session. Pt to EOB after session with all needs met.   [facility]

## 2023-06-17 NOTE — Progress Notes (Signed)
 Occupational Therapy Treatment Patient Details Name: [name] MRN: [id number] DOB: 1955/11/30 Today's Date: 06/17/2023   History of present illness Pt is a 67 year old woman admitted 12/23 from [facility], where she was receiving rehab, with hyponatremia and AKI associated with nausea and vomiting. PMH: cervical cancer, colovaginal fistula, multiple abdominal surgeries, recent nephrostomy due to hydronephrosis, CKD, anxiety, headaches, UTI.   OT comments  Pt. Seen for skilled OT treatment session.  Agreeable to participation even with notable mood fluctuation and changing what she would engage in therapy wise.  Bed mobility CGA.  In room ambulation initially to b.room but to recliner instead.  Pt. Required MAX tactile and verbal cues to slow pace and manage RW around obstacles and furniture.  Pt. Eager for d/c when able.  Asking same questions over and over regarding hospital stay, what is wrong with her, and how d/c planning works.  MD present at end of session, encouraged pt. To ask him also to gain better insight/understanding of her medical and d/c questions.  Cont. With current acute OT POC.        If plan is discharge home, recommend the following:  Two people to help with walking and/or transfers;A lot of help with bathing/dressing/bathroom;Assistance with cooking/housework;Direct supervision/assist for medications management;Direct supervision/assist for financial management;Assist for transportation;Help with stairs or ramp for entrance   Equipment Recommendations  Other (comment)    Recommendations for Other Services      Precautions / Restrictions Precautions Precautions: Fall Precaution Comments: R ileostomy, R nephrostomy drain       Mobility Bed Mobility Overal bed mobility: Needs Assistance Bed Mobility: Supine to Sit     Supine to sit: Contact guard     General bed mobility comments: cues for management of lines/drains, pt. trying to remove cardiac  box stating its too heavy attempts to educate why she could not take it off and throw it on the bed.  assisted with placement in pocket    Transfers Overall transfer level: Needs assistance Equipment used: Rolling walker (2 wheels) Transfers: Sit to/from Stand, Bed to chair/wheelchair/BSC Sit to Stand: Contact guard assist     Step pivot transfers: Min assist     General transfer comment: Little regard for lines/drains/safety. Cues for safety, hand placement. Assist to steady.-most assistance required to manage rw and slow pace, poor management around obstacles (tray table, edge of bed)     Balance                                           ADL either performed or assessed with clinical judgement   ADL Overall ADL's : Needs assistance/impaired                         Toilet Transfer: Minimal assistance;Cueing for safety;Cueing for sequencing;Ambulation;Rolling walker (2 wheels) Toilet Transfer Details (indicate cue type and reason): simulated during in room ambulation around bed to recliner. (initially agreeable to b.room but changed course during ambulation)         Functional mobility during ADLs: Minimal assistance;Rolling walker (2 wheels);Cueing for sequencing;Cueing for safety General ADL Comments: max cues (tactile/verbal) for rw management. pt. picking it up during ambulation, attempting to leave it mid stride, getting it jammed in and on things and not removing it or backing up to manage it    Extremity/Trunk Assessment  Vision       Perception     Praxis      Cognition Arousal: Alert Behavior During Therapy: Lability, Impulsive, Agitated, WFL for tasks assessed/performed Overall Cognitive Status: Impaired/Different from baseline                   Orientation Level: Situation, Time Current Attention Level: Focused Memory: Decreased short-term memory, Decreased recall of precautions Following Commands:  Follows one step commands with increased time, Follows one step commands inconsistently Safety/Judgement: Decreased awareness of safety, Decreased awareness of deficits Awareness: Intellectual Problem Solving: Requires verbal cues, Requires tactile cues General Comments: Pt was AxO x2 easily distracted and masks her confusion with anger.  Required repaet functional VC's.  Pt would ask the same questions.  Poor memory.  Poor recall.        Exercises      Shoulder Instructions       General Comments  [place], speaking of memories of going with her friends.     Pertinent Vitals/ Pain       Pain Assessment Pain Assessment: No/denies pain  Home Living                                          Prior Functioning/Environment              Frequency  Min 1X/week        Progress Toward Goals  OT Goals(current goals can now be found in the care plan section)  Progress towards OT goals: Progressing toward goals     Plan      Co-evaluation                 AM-PAC OT 6 Clicks Daily Activity     Outcome Measure   Help from another person eating meals?: A Little Help from another person taking care of personal grooming?: A Lot Help from another person toileting, which includes using toliet, bedpan, or urinal?: Total Help from another person bathing (including washing, rinsing, drying)?: A Lot Help from another person to put on and taking off regular upper body clothing?: A Lot Help from another person to put on and taking off regular lower body clothing?: Total 6 Click Score: 11    End of Session Equipment Utilized During Treatment: Gait belt;Rolling walker (2 wheels)  OT Visit Diagnosis: Unsteadiness on feet (R26.81);Other abnormalities of gait and mobility (R26.89);Muscle weakness (generalized) (M62.81);Hemiplegia and hemiparesis;Other symptoms and signs involving cognitive function   Activity Tolerance Patient tolerated treatment  well   Patient Left in chair;with call bell/phone within reach;with chair alarm set;with family/visitor present;Other (comment) (MD in room mtg with pt. and spouse at end of session)   Nurse Communication Other (comment) (rn states ok to work with pt.)        Time: 1011-1027 OT Time Calculation (min): 16 min  Charges: OT General Charges $OT Visit: 1 Visit OT Treatments $Self Care/Home Management : 8-22 mins  Randall, COTA/L Acute Rehabilitation [phone]   [name] Nest Lorraine-COTA/L 06/17/2023, 10:44 AM

## 2023-06-17 NOTE — Progress Notes (Signed)
 [facility] Progress Note  SUBJECTIVE:   Interval history: [name] was seen and evaluated today at bedside. Spouse at bedside. Both parties quite frustrated with her care; I had an in-depth discussion with them about liver enzyme changes and current work up including holding TPN x 2 days (which did not lead to improvement in liver enzyme testing and TPN resumed today), question relation of Rocephin  (12/25-12/30) versus Haldol  (12/24-12/29) as cause of liver enzyme evaluation. I have ordered further serologic workup this AM for elevated liver enzymes.   It is reasonable to monitor her liver enzyme trend in the outpatient setting, she does not have a PCP for which to follow these liver enzymes. I offered for her to follow up with my office to have these tests completed, likely a repeat hepatic function panel in 1 week, further labs to be determined based on this testing. Prior to presentation, patient was in rehab facility, it is also reasonable for labs to be completed at rehab facility and sent to my office. Both parties appeared frustrated with these options. I encouraged her to become established with a PCP given the complexity of her medical history and need for monitoring moving forward. She has a nephrostomy tube and questioned whether I could remove this, I informed her no, that she would need to follow up with physician that placed this, spouse noted that she has an appointment in January 2025 for further management of this. She has plans to follow up with her surgeon regarding her ostomy.   She denied chest pain, shortness of breath, abdominal pain. Denied abnormalities with her ostomy output.   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   Thyroid  disease    Vertigo    Past Surgical History:  Procedure Laterality Date    ABDOMINAL ADHESION SURGERY  11/08/2020   Dr Darral - [facility]   ABDOMINAL HYSTERECTOMY  2003   [facility] - Dr Ivery - for cervical cancer   BILE DUCT EXPLORATION  10/22/2003   CHOLECYSTECTOMY OPEN  10/22/2003   Dr Lorriane - CBD repair w T tube   CYSTOSCOPY  2021   ILEOSTOMY  11/23/2018   ILEOSTOMY CLOSURE  04/2020   LOW ANTERIOR BOWEL RESECTION  11/23/2018   LAR/takedown colovaginal fistula/loop ileostomy  - [facility]   Current Facility-Administered Medications  Medication Dose Route Frequency Provider Last Rate Last Admin   0.9 %  sodium chloride  infusion   Intravenous Continuous Cheryle Page, MD 50 mL/hr at 06/17/23 0330 New Bag at 06/17/23 0330   acetaminophen  (TYLENOL ) tablet 1,000 mg  1,000 mg Oral Once Nivia Colon, PA-C       acetaminophen  (TYLENOL ) tablet 650 mg  650 mg Oral Q6H PRN Cheryle Page, MD   650 mg at 06/16/23 1955   ALPRAZolam  (XANAX ) tablet 0.5 mg  0.5 mg Oral TID PRN Cheryle Page, MD   0.5 mg at 06/17/23 0106   Chlorhexidine  Gluconate Cloth 2 % PADS 6 each  6 each Topical Daily Andrez Chroman, NP   6 each at 06/16/23 9078   enoxaparin  (LOVENOX ) injection 40 mg  40 mg Subcutaneous Q24H Pham, Anh P, RPH   40 mg at 06/16/23 0920   TPN (CLINIMIX -E) Adult   Intravenous Continuous TPN Osie Iantha SQUIBB, RPH       And  fat emulsion 20 % (SMOFLIPID ) infusion  250 mL Intravenous Continuous TPN Pham, Anh P, RPH       feeding supplement (ENSURE ENLIVE / ENSURE PLUS) liquid 237 mL  237 mL Oral BID BM Alekh, Kshitiz, MD   237 mL at 06/16/23 1700   haloperidol  lactate (HALDOL ) injection 1 mg  1 mg Intravenous Q6H PRN Cheryle Page, MD   1 mg at 06/15/23 1434   insulin  aspart (novoLOG ) injection 0-9 Units  0-9 Units Subcutaneous Q6H Gadhia, Jigna M, RPH   1 Units at 06/16/23 2348   levothyroxine  (SYNTHROID ) tablet 25 mcg  25 mcg Oral Q0600 Tu, Ching T, DO   25 mcg at 06/17/23 9342   lip balm (CARMEX) ointment   Topical PRN Ricky Pax T, DO   1 Application at 06/10/23 0540   LORazepam   (ATIVAN ) injection 1 mg  1 mg Intravenous Once Alekh, Kshitiz, MD       mirtazapine  (REMERON ) tablet 7.5 mg  7.5 mg Oral QHS Tu, Ching T, DO   7.5 mg at 06/16/23 2113   ondansetron  (ZOFRAN ) injection 4 mg  4 mg Intravenous Q6H PRN Tu, Ching T, DO   4 mg at 06/15/23 0411   Oral care mouth rinse  15 mL Mouth Rinse PRN Cheryle Page, MD       pantoprazole  (PROTONIX ) EC tablet 40 mg  40 mg Oral Daily Tu, Ching T, DO   40 mg at 06/16/23 9079   potassium chloride  (KLOR-CON ) packet 20 mEq  20 mEq Oral Once Pham, Anh P, RPH       potassium chloride  (KLOR-CON ) packet 40 mEq  40 mEq Oral Once Pham, Anh P, RPH       sodium chloride  flush (NS) 0.9 % injection 10-40 mL  10-40 mL Intracatheter Q12H Alekh, Kshitiz, MD   10 mL at 06/16/23 2114   thiamine  (VITAMIN B1) tablet 100 mg  100 mg Oral Daily Cheryle Page, MD   100 mg at 06/16/23 0920   Allergies as of 06/09/2023 - Review Complete 06/09/2023  Allergen Reaction Noted   Barium-containing compounds  03/17/2019   Percocet [oxycodone-acetaminophen ] Itching and Other (See Comments) 03/04/2014   Review of Systems:  Review of Systems  Respiratory:  Negative for shortness of breath.   Cardiovascular:  Negative for chest pain.  Gastrointestinal:  Negative for abdominal pain.    OBJECTIVE:   Temp:  [97.5 F (36.4 C)-98 F (36.7 C)] 97.6 F (36.4 C) (12/31 0421) Pulse Rate:  [81-136] 82 (12/31 0421) Resp:  [16-21] 16 (12/31 0421) BP: (86-104)/(63-93) 94/63 (12/31 0421) SpO2:  [99 %-100 %] 100 % (12/31 0421) Last BM Date : 06/16/23 Physical Exam Constitutional:      General: She is not in acute distress.    Appearance: She is not ill-appearing, toxic-appearing or diaphoretic.  Cardiovascular:     Rate and Rhythm: Normal rate and regular rhythm.  Pulmonary:     Effort: No respiratory distress.     Breath sounds: Normal breath sounds.  Abdominal:     General: Bowel sounds are normal. There is no distension.     Palpations: Abdomen is soft.      Tenderness: There is no abdominal tenderness. There is no guarding.     Comments: Ostomy noted in RLQ, semi-liquid stool with some form appreciated, non-bloody.  Neurological:     Mental Status: She is alert.     Labs: Recent Labs    06/16/23 0308 06/17/23 0255  WBC 4.6 4.5  HGB  10.4* 10.7*  HCT 30.6* 33.9*  PLT 190 203   BMET Recent Labs    06/15/23 0328 06/16/23 0308 06/17/23 0255  NA 130* 131* 128*  K 3.5 3.3* 3.1*  CL 90* 91* 88*  CO2 30 31 28   GLUCOSE 96 93 98  BUN 53* 45* 40*  CREATININE 0.82 0.97 1.21*  CALCIUM  9.2 8.9 8.7*   LFT Recent Labs    06/17/23 0255  PROT 6.8  ALBUMIN  2.9*  AST 223*  ALT 469*  ALKPHOS 518*  BILITOT 0.7   PT/INR No results for input(s): LABPROT, INR in the last 72 hours. Diagnostic imaging: No results found.  IMPRESSION: Transaminase elevation in mixed pattern             -Etiology undetermined, TPN vs medication induced (Rocephin  12/25/-12/30, Haldol  12/24-12/29) versus other History cervical cancer 2003  -Treated with pelvic radiation therapy c/b colovaginal fistula repaired in 2019 with loop ileostomy -Required redo low anterior resection with fistula repair and end-to-end sigmoidrectal anastomosis 11/23/2018 Adhesions secondary to previous abdominal surgeries  -S/p ex-lap with lysis of adhesions, distal ileum and cecum resection with end ileostomy creation on 03/14/23 Small bowel obstruction May 2022 requiring lysis of adhesions, small bowel obstruction June 2024 treated medically 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 given intractable nausea and emesis  PLAN: -Follow up expanded serologic workup for elevated liver enzymes including ANA, ASMA, AMA, ceruloplasmin, alpha-1 antitrypsin, acute hepatitis panel  -Ok to resume TPN -No further rocephin , haldol  -Trend liver enzyme panel -Will require outpatient monitoring of liver enzyme trend, can be completed through my office versus rehab versus PCP (if patient becomes  established with one) -May require liver biopsy pending liver enzyme trend  -[facility] will follow -Above communicated with IM primary team    LOS: 7 days   Estefana Keas, [facility]

## 2023-06-17 NOTE — Plan of Care (Signed)

## 2023-06-17 NOTE — Progress Notes (Signed)
 PROGRESS NOTE    [name]  FMW:[id number] DOB: 03/20/1956 DOA: 06/09/2023 PCP: Patient, [name]   Brief Narrative:  This 67 yrs old female with medical history significant of cervical cancer and rectovaginal fistula s/p redo low anterior resection with colovaginal fistula takedown, fistula repair, and hand sewn end to end sigmoidrectal anastomosis with loop ileostomy on 11/23/18; closure of loop ileostomy 05/01/20, SBO s/p ex-lap with lysis of adhesions on 11/08/20, recurrent SBO s/p ex-lap, LOA, distal ileum and cecum resection with end ileostomy creation on 03/14/2023 started on TPN, right hydronephrosis s/p R ureteral stent placement on 04/02/2023 with subsequent obstructive uropathy, UTI, AKI and bilateral hydronephrosis with placement of right nephrostomy tube on 05/08/2023 presented with nausea, vomiting and hyponatremia from SNF.  Patient has been on TPN recently after her recent hospitalization at [facility] and discharged to SNF.  On presentation, she was tachypneic with sodium of 121 (last sodium on 05/24/2023 in care everywhere was 130).  Creatinine was 2.07 (last creatinine was 1.09 on 05/24/2023) with bicarb of 18.  CT of abdomen and pelvis did not show SBO but showed moderate to marked severity right-sided hydronephrosis and proximal hydroureter with percutaneous right-sided nephrostomy tube and right sided intraureteral stent in place along with mild to moderate severity left-sided hydronephrosis and proximal hydroureter with markedly dilated intrarenal calyces on the left.  She was started on IV fluids.  TPN was resumed.  She was started on IV antibiotics for possible UTI.  Urology was also consulted and recommended conservative management and outpatient follow-up.  PT recommending SNF placement.  TOC following.   Assessment & Plan:   Principal Problem:   Hyponatremia Active Problems:   Acute-on-chronic kidney injury (HCC)   Hypothyroidism   Mixed  hyperlipidemia   Failure to thrive in adult   Nausea & vomiting   Transaminitis   Metabolic acidosis with normal anion gap and bicarbonate losses   Bilateral hydronephrosis   Tremors of nervous system   Hyponatremia -Possibly due to decreased oral intake and likely GI loss. -Sodium 121 on presentation.  Sodium 128 this morning.   Continue IV fluids as below.  Encourage oral intake.  DC'd sertraline .   AKI on CKD stage IIIa:  Acute metabolic acidosis > Resolved. Possible UTI: POA  History of bilateral hydronephrosis status post ureteral stent and right nephrostomy tube. Patient presented with creatinine of 2.07, last creatinine was 1.09 on 05/24/2023.   AKI resolved.  Metabolic acidosis resolved. Creatinine slightly up today 1.21 Dr. Cheryle has discussed with Dr. Borden/urology on 06/10/2023: He reviewed prior records, notes and imaging and stated that patient has an almost atrophic/nonfunctional left kidney with possible chronic hydronephrosis and patient would not need any procedure for left hydronephrosis.  As long as right nephrostomy tube is draining and kidney function is improving, Patient will need to follow-up with urology as an outpatient. -Urology evaluation by Dr. Gaston on 06/13/2023 appreciated: Recommended to continue nephrostomy tube for now and outpatient follow-up with regular urologist in [facility]. -UA suggestive of possible UTI.  Completed 5 days of Rocephin .  Urine culture grew multiple species.   Hypothyroidism: Continue levothyroxine    Hyperlipidemia: Hold statin due to transaminites.   Transaminitis: -LFTs continue to trend upwards.  CT imaging showed no biliary dilatation.   TPN discontinued.  gentle hydration with normal saline.  GI agreed. TPN resumed.   Nausea and vomiting: -Has been an ongoing chronic issue for her due to her complex abdominal surgical history.  No small bowel obstruction demonstrated on CT imaging on presentation.  Continue as  needed antiemetics.  Currently improving.  Outpatient follow-up with primary surgical team   Failure to thrive in adult: --Pt has extensive abdominal surgery due to hx of cervical cancer and currently on TPN , on hold. -Palliative care evaluation appreciated.  Patient remains full code.   History of cervical cancer and rectovaginal fistula status post surgical intervention -Currently has an ileostomy.  Outpatient follow-up with oncology/GYN oncology/general surgery   Anxiety/depression: -Continue mirtazapine  along with as needed Xanax . -Hold Zoloft  in the setting of hyponatremia   Physical deconditioning -Patient from SNF.  PT recommending SNF placement.  TOC following   DVT prophylaxis: Lovenox  Code Status: Full code Family Communication: No one at bedside Disposition Plan:    Status is: Inpatient Remains inpatient appropriate because: Severity of illness.   Consultants:  Neurology Gastroenterology Palliative care  Procedures: None Antimicrobials:  Anti-infectives (From admission, onward)    Start     Dose/Rate Route Frequency Ordered Stop   06/11/23 1200  cefTRIAXone  (ROCEPHIN ) 1 g in sodium chloride  0.9 % 100 mL IVPB        1 g 200 mL/hr over 30 Minutes Intravenous Every 24 hours 06/11/23 1154 06/15/23 1316      Subjective: Patient was seen and examined at bedside. Overnight events noted.   Reports doing better.  She wants to be discharged. TPN has been on hold due to increase in LFTs but TPN was resumed today.  Patient needs placement.  Objective: Vitals:   06/16/23 1933 06/16/23 2142 06/17/23 0114 06/17/23 0421  BP: (!) 86/65 90/66 (!) 104/93 94/63  Pulse: (!) 136 96 81 82  Resp: (!) 21   16  Temp: 98 F (36.7 C) (!) 97.5 F (36.4 C) 97.7 F (36.5 C) 97.6 F (36.4 C)  TempSrc: Oral Oral Oral Oral  SpO2: 99% 100% 99% 100%  Weight:      Height:        Intake/Output Summary (Last 24 hours) at 06/17/2023 1357 Last data filed at 06/17/2023 1000 Gross  per 24 hour  Intake 1682.04 ml  Output 2000 ml  Net -317.96 ml   Filed Weights   06/11/23 0457 06/13/23 0500 06/16/23 0500  Weight: 50.1 kg 50.2 kg 46.6 kg    Examination:  General exam: Appears calm and comfortable, deconditioned, not in any acute distress. Respiratory system: Clear to auscultation. Respiratory effort normal.  RR 16 Cardiovascular system: S1 & S2 heard, RRR. No JVD, murmurs, rubs, gallops or clicks.  Gastrointestinal system: Abdomen is non distended, soft and non tender. Normal bowel sounds heard. Central nervous system: Alert and oriented x 3. No focal neurological deficits. Extremities:  No Edema, no cyanosis, no clubbing Skin: No rashes, lesions or ulcers Psychiatry: Judgement and insight appear normal. Mood & affect appropriate.     Data Reviewed: I have personally reviewed following labs and imaging studies  CBC: Recent Labs  Lab 06/11/23 0340 06/12/23 0301 06/13/23 0331 06/16/23 0308 06/17/23 0255  WBC 6.5 7.3 5.9 4.6 4.5  NEUTROABS 4.9 4.2 4.2 2.5  --   HGB 10.7* 10.1* 10.2* 10.4* 10.7*  HCT 31.6* 29.8* 29.4* 30.6* 33.9*  MCV 88.8 88.7 87.8 90.3 91.4  PLT 195 217 202 190 203   Basic Metabolic Panel: Recent Labs  Lab 06/13/23 0331 06/14/23 0352 06/15/23 0328 06/16/23 0308 06/17/23 0255  NA 130* 134* 130* 131* 128*  K 3.6 3.0* 3.5 3.3* 3.1*  CL 102 96* 90* 91*  88*  CO2 19* 30 30 31 28   GLUCOSE 136* 92 96 93 98  BUN 60* 48* 53* 45* 40*  CREATININE 0.88 0.77 0.82 0.97 1.21*  CALCIUM  9.1 8.7* 9.2 8.9 8.7*  MG 1.7 2.3 2.4 2.1 1.9  PHOS 3.2 2.5 2.7 3.3 3.3   GFR: Estimated Creatinine Clearance: 33.2 mL/min (A) (by C-G formula based on SCr of 1.21 mg/dL (H)). Liver Function Tests: Recent Labs  Lab 06/13/23 0331 06/14/23 0352 06/15/23 0328 06/16/23 0308 06/17/23 0255  AST 162* 145* 234* 167* 223*  ALT 259* 302* 443* 399* 469*  ALKPHOS 296* 289* 378* 434* 518*  BILITOT 0.5 0.4 0.4 0.5 0.7  PROT 6.5 5.9* 6.2* 6.5 6.8  ALBUMIN   2.8* 2.4* 2.8* 2.8* 2.9*   No results for input(s): LIPASE, AMYLASE in the last 168 hours.  No results for input(s): AMMONIA in the last 168 hours. Coagulation Profile: No results for input(s): INR, PROTIME in the last 168 hours. Cardiac Enzymes: No results for input(s): CKTOTAL, CKMB, CKMBINDEX, TROPONINI in the last 168 hours. BNP (last 3 results) No results for input(s): PROBNP in the last 8760 hours. HbA1C: No results for input(s): HGBA1C in the last 72 hours. CBG: Recent Labs  Lab 06/16/23 1224 06/16/23 1638 06/16/23 2332 06/17/23 0621 06/17/23 1215  GLUCAP 231* 169* 125* 97 184*   Lipid Profile: Recent Labs    06/16/23 0308  TRIG 57   Thyroid  Function Tests: No results for input(s): TSH, T4TOTAL, FREET4, T3FREE, THYROIDAB in the last 72 hours. Anemia Panel: No results for input(s): VITAMINB12, FOLATE, FERRITIN, TIBC, IRON, RETICCTPCT in the last 72 hours. Sepsis Labs: No results for input(s): PROCALCITON, LATICACIDVEN in the last 168 hours.  Recent Results (from the past 240 hours)  Resp panel by RT-PCR (RSV, Flu A&B, Covid) Anterior Nasal Swab     Status: None   Collection Time: 06/09/23  8:47 PM   Specimen: Anterior Nasal Swab  Result Value Ref Range Status   SARS Coronavirus 2 by RT PCR NEGATIVE NEGATIVE Final    Comment: (NOTE) SARS-CoV-2 target nucleic acids are NOT DETECTED.  The SARS-CoV-2 RNA is generally detectable in upper respiratory specimens during the acute phase of infection. The lowest concentration of SARS-CoV-2 viral copies this assay can detect is 138 copies/mL. A negative result does not preclude SARS-Cov-2 infection and should not be used as the sole basis for treatment or other patient management decisions. A negative result may occur with  improper specimen collection/handling, submission of specimen other than nasopharyngeal swab, presence of viral mutation(s) within the areas targeted  by this assay, and inadequate number of viral copies(<138 copies/mL). A negative result must be combined with clinical observations, patient history, and epidemiological information. The expected result is Negative.  Fact Sheet for Patients:  bloggercourse.com  Fact Sheet for Healthcare Providers:  seriousbroker.it  This test is no t yet approved or cleared by the [place]  FDA and  has been authorized for detection and/or diagnosis of SARS-CoV-2 by FDA under an Emergency Use Authorization (EUA). This EUA will remain  in effect (meaning this test can be used) for the duration of the COVID-19 declaration under Section 564(b)(1) of the Act, 21 U.S.C.section 360bbb-3(b)(1), unless the authorization is terminated  or revoked sooner.       Influenza A by PCR NEGATIVE NEGATIVE Final   Influenza B by PCR NEGATIVE NEGATIVE Final    Comment: (NOTE) The Xpert Xpress SARS-CoV-2/FLU/RSV plus assay is intended as an aid in the diagnosis of influenza  from Nasopharyngeal swab specimens and should not be used as a sole basis for treatment. Nasal washings and aspirates are unacceptable for Xpert Xpress SARS-CoV-2/FLU/RSV testing.  Fact Sheet for Patients: bloggercourse.com  Fact Sheet for Healthcare Providers: seriousbroker.it  This test is not yet approved or cleared by the [place]  FDA and has been authorized for detection and/or diagnosis of SARS-CoV-2 by FDA under an Emergency Use Authorization (EUA). This EUA will remain in effect (meaning this test can be used) for the duration of the COVID-19 declaration under Section 564(b)(1) of the Act, 21 U.S.C. section 360bbb-3(b)(1), unless the authorization is terminated or revoked.     Resp Syncytial Virus by PCR NEGATIVE NEGATIVE Final    Comment: (NOTE) Fact Sheet for Patients: bloggercourse.com  Fact  Sheet for Healthcare Providers: seriousbroker.it  This test is not yet approved or cleared by the [place]  FDA and has been authorized for detection and/or diagnosis of SARS-CoV-2 by FDA under an Emergency Use Authorization (EUA). This EUA will remain in effect (meaning this test can be used) for the duration of the COVID-19 declaration under Section 564(b)(1) of the Act, 21 U.S.C. section 360bbb-3(b)(1), unless the authorization is terminated or revoked.  Performed at [facility], [address], [place], [place] [address]   MRSA Next Gen by PCR, Nasal     Status: Abnormal   Collection Time: 06/10/23  2:30 AM   Specimen: Nasal Mucosa; Nasal Swab  Result Value Ref Range Status   MRSA by PCR Next Gen DETECTED (A) NOT DETECTED Final    Comment: (NOTE) The GeneXpert MRSA Assay (FDA approved for NASAL specimens only), is one component of a comprehensive MRSA colonization surveillance program. It is not intended to diagnose MRSA infection nor to guide or monitor treatment for MRSA infections. Test performance is not FDA approved in patients less than 64 years old. Performed at [facility], [address] [place], [place] [address]   Urine Culture (for pregnant, neutropenic or urologic patients or patients with an indwelling urinary catheter)     Status: Abnormal   Collection Time: 06/10/23  6:00 PM   Specimen: Urine, Bag (ped)  Result Value Ref Range Status   Specimen Description   Final    URINE, BAG PED Performed at [facility], [address] [place], [place] [address]    Special Requests   Final    NONE Performed at [facility], [address], [place], [place] [address]    Culture MULTIPLE SPECIES PRESENT, SUGGEST RECOLLECTION (A)  Final   Report Status 06/12/2023 FINAL  Final    Radiology Studies: No results found.  Scheduled Meds:  acetaminophen   1,000 mg  Oral Once   Chlorhexidine  Gluconate Cloth  6 each Topical Daily   enoxaparin  (LOVENOX ) injection  40 mg Subcutaneous Q24H   feeding supplement  237 mL Oral BID BM   insulin  aspart  0-9 Units Subcutaneous Q6H   levothyroxine   25 mcg Oral Q0600   LORazepam   1 mg Intravenous Once   mirtazapine   7.5 mg Oral QHS   pantoprazole   40 mg Oral Daily   potassium chloride   20 mEq Oral Once   sodium chloride  flush  10-40 mL Intracatheter Q12H   thiamine   100 mg Oral Daily   Continuous Infusions:  sodium chloride  50 mL/hr at 06/17/23 0330   TPN (CLINIMIX -E) Adult     And   fat emul(SMOFlipid )       LOS: 7 days  Time spent: 35 mins    Darcel Dawley, MD [facility]   If 7PM-7AM, please contact night-coverage

## 2023-06-17 NOTE — Progress Notes (Signed)
 Nutrition Follow-up  DOCUMENTATION CODES:      INTERVENTION:  -  Plan to restart TPN tonight.  -TPN management per Pharmacy   - Regular diet.  - Continue Ensure Plus High Protein po BID, each supplement provides 350 kcal and 20 grams of protein as tolerated   - Monitor weight trends.   NUTRITION DIAGNOSIS:   Inadequate oral intake related to nausea, vomiting, altered GI function as evidenced by per patient/family report. *ongoing  GOAL:   Patient will meet greater than or equal to 90% of their needs *met with reinitiating of TPN  MONITOR:   PO intake, Supplement acceptance, Labs, Weight trends, I & O's, Skin (TPN)  REASON FOR ASSESSMENT:   Consult New TPN/TNA  ASSESSMENT:   67 y.o. female with medical history significant of cervical cancer and rectovaginal fistula s/p redo low anterior resection with colovaginal fistula takedown, fistula repair, and hand sewn end to end sigmoidrectal anastomosis with loop ileostomy on 11/23/18; closure of loop ileostomy 05/01/20, SBO s/p ex-lap with lysis of adhesions on 11/08/20, recurrent SBO s/p ex-lap, LOA, distal ileum and cecum resection with end ileostomy creation on 03/14/2023 started on TPN, right hydronephrosis s/p R ureteral stent placement on 04/02/2023 with subsequent obstructive uropathy, UTI, AKI and bilateral hydronephrosis with placement of right nephrostomy tube on 05/08/2023 who presents with nausea and vomiting and hyponatremia.  12/23: Admit 12/24: TPN initiated 12/29: TPN held d/t elevated LFT's 12/31: TPN to be restared  Both patient and husband visibly upset at time of visit.  Inquired how patient had been eating and patient asking why it matters. Discussed importance of trying to eat well and maintain some oral intake, especially while TPN off.  Patient and husband then reported they did not know TPN was turned off. Both very frustrated with patient's care and inquiring about discharge. Reached out to Hospitalist MD  and GI about patient's concerns. GI met with patient at bedside to discuss concerns.  Patient eating an average of 41% of meals and drinking Ensure around 2 times daily. Plan to restart TPN tonight as LFT's without significant change when TPN was stopped.   Admit weight: 108# Current weight: 102#  I&O's: -4.8L   Medications reviewed and include: Remeron , 100mg  thiamine    Labs reviewed:  Na 128 K+ 3.1 Creatinine 1.21 Triglycerides 57 (as of 12/30)   NUTRITION - FOCUSED PHYSICAL EXAM:  Patient very frustrated, deferred  Diet Order:   Diet Order             Diet regular Room service appropriate? Yes; Fluid consistency: Thin  Diet effective now                   EDUCATION NEEDS:  No education needs have been identified at this time  Skin:  Skin Assessment: Skin Integrity Issues: Skin Integrity Issues:: Stage II Stage II: mid sacrum  Last BM:  12/30 - type 7 - ileostomy  Height:  Ht Readings from Last 1 Encounters:  06/10/23 5' 2 (1.575 m)   Weight:  Wt Readings from Last 1 Encounters:  06/16/23 46.6 kg    BMI:  Body mass index is 18.79 kg/m.  Estimated Nutritional Needs:  Kcal:  1500-1700 Protein:  75-90g Fluid:  1.7L/day    Trude Ned RD, LDN Contact via Secure Chat.

## 2023-06-18 DIAGNOSIS — E871 Hypo-osmolality and hyponatremia: Secondary | ICD-10-CM | POA: Diagnosis not present

## 2023-06-18 LAB — GLUCOSE, CAPILLARY
Glucose-Capillary: 107 mg/dL — ABNORMAL HIGH (ref 70–99)
Glucose-Capillary: 155 mg/dL — ABNORMAL HIGH (ref 70–99)
Glucose-Capillary: 171 mg/dL — ABNORMAL HIGH (ref 70–99)
Glucose-Capillary: 197 mg/dL — ABNORMAL HIGH (ref 70–99)

## 2023-06-18 LAB — CBC
HCT: 30.1 % — ABNORMAL LOW (ref 36.0–46.0)
Hemoglobin: 9.9 g/dL — ABNORMAL LOW (ref 12.0–15.0)
MCH: 30.2 pg (ref 26.0–34.0)
MCHC: 32.9 g/dL (ref 30.0–36.0)
MCV: 91.8 fL (ref 80.0–100.0)
Platelets: 177 10*3/uL (ref 150–400)
RBC: 3.28 MIL/uL — ABNORMAL LOW (ref 3.87–5.11)
RDW: 15 % (ref 11.5–15.5)
WBC: 4.4 10*3/uL (ref 4.0–10.5)
nRBC: 0 % (ref 0.0–0.2)

## 2023-06-18 LAB — COMPREHENSIVE METABOLIC PANEL
ALT: 343 U/L — ABNORMAL HIGH (ref 0–44)
AST: 121 U/L — ABNORMAL HIGH (ref 15–41)
Albumin: 2.8 g/dL — ABNORMAL LOW (ref 3.5–5.0)
Alkaline Phosphatase: 481 U/L — ABNORMAL HIGH (ref 38–126)
Anion gap: 9 (ref 5–15)
BUN: 47 mg/dL — ABNORMAL HIGH (ref 8–23)
CO2: 29 mmol/L (ref 22–32)
Calcium: 8.9 mg/dL (ref 8.9–10.3)
Chloride: 93 mmol/L — ABNORMAL LOW (ref 98–111)
Creatinine, Ser: 1.24 mg/dL — ABNORMAL HIGH (ref 0.44–1.00)
GFR, Estimated: 48 mL/min — ABNORMAL LOW (ref >60)
Glucose, Bld: 149 mg/dL — ABNORMAL HIGH (ref 70–99)
Potassium: 3.9 mmol/L (ref 3.5–5.1)
Sodium: 131 mmol/L — ABNORMAL LOW (ref 135–145)
Total Bilirubin: 0.3 mg/dL (ref 0.0–1.2)
Total Protein: 6.3 g/dL — ABNORMAL LOW (ref 6.5–8.1)

## 2023-06-18 LAB — MAGNESIUM: Magnesium: 1.9 mg/dL (ref 1.7–2.4)

## 2023-06-18 LAB — PHOSPHORUS: Phosphorus: 3.6 mg/dL (ref 2.5–4.6)

## 2023-06-18 LAB — IGG: IgG (Immunoglobin G), Serum: 952 mg/dL (ref 586–1602)

## 2023-06-18 LAB — CERULOPLASMIN: Ceruloplasmin: 27 mg/dL (ref 19.0–39.0)

## 2023-06-18 LAB — ALPHA-1-ANTITRYPSIN: A-1 Antitrypsin, Ser: 187 mg/dL (ref 101–187)

## 2023-06-18 MED ORDER — SODIUM CHLORIDE 0.9 % IV BOLUS
500.0000 mL | Freq: Once | INTRAVENOUS | Status: AC
  Administered 2023-06-18: 500 mL via INTRAVENOUS

## 2023-06-18 MED ORDER — FAT EMUL FISH OIL/PLANT BASED 20% (SMOFLIPID)IV EMUL
250.0000 mL | INTRAVENOUS | Status: AC
  Administered 2023-06-18: 250 mL via INTRAVENOUS
  Filled 2023-06-18: qty 250

## 2023-06-18 MED ORDER — TRACE MINERALS CU-MN-SE-ZN 300-55-60-3000 MCG/ML IV SOLN
INTRAVENOUS | Status: AC
  Filled 2023-06-18 (×2): qty 1000

## 2023-06-18 NOTE — Progress Notes (Signed)
 PROGRESS NOTE    [name]  FMW:[id number] DOB: 05-19-1956 DOA: 06/09/2023 PCP: Patient, [name]   Brief Narrative:  This 68 yrs old female with medical history significant of cervical cancer and rectovaginal fistula s/p redo low anterior resection with colovaginal fistula takedown, fistula repair, and hand sewn end to end sigmoidrectal anastomosis with loop ileostomy on 11/23/18; closure of loop ileostomy 05/01/20, SBO s/p ex-lap with lysis of adhesions on 11/08/20, recurrent SBO s/p ex-lap, LOA, distal ileum and cecum resection with end ileostomy creation on 03/14/2023 started on TPN, right hydronephrosis s/p R ureteral stent placement on 04/02/2023 with subsequent obstructive uropathy, UTI, AKI and bilateral hydronephrosis with placement of right nephrostomy tube on 05/08/2023 presented with nausea, vomiting and hyponatremia from SNF.  Patient has been on TPN recently after her recent hospitalization at [facility] and discharged to SNF.  On presentation, she was tachypneic with sodium of 121 (last sodium on 05/24/2023 in care everywhere was 130).  Creatinine was 2.07 (last creatinine was 1.09 on 05/24/2023) with bicarb of 18.  CT of abdomen and pelvis did not show SBO but showed moderate to marked severity right-sided hydronephrosis and proximal hydroureter with percutaneous right-sided nephrostomy tube and right sided intraureteral stent in place along with mild to moderate severity left-sided hydronephrosis and proximal hydroureter with markedly dilated intrarenal calyces on the left.  She was started on IV fluids.  TPN was resumed.  She was started on IV antibiotics for possible UTI.  Urology was also consulted and recommended conservative management and outpatient follow-up.  PT recommending SNF placement.  TOC following.   Assessment & Plan:   Principal Problem:   Hyponatremia Active Problems:   Acute-on-chronic kidney injury (HCC)   Hypothyroidism   Mixed  hyperlipidemia   Failure to thrive in adult   Nausea & vomiting   Transaminitis   Metabolic acidosis with normal anion gap and bicarbonate losses   Bilateral hydronephrosis   Tremors of nervous system   Hyponatremia: -Possibly due to decreased oral intake and likely GI loss. -Sodium 121 on presentation.  Sodium 131 this morning.   Continue IV fluids as below.  Encourage oral intake.  DC'd sertraline .   AKI on CKD stage IIIa:  Acute metabolic acidosis > Resolved. Possible UTI: POA  History of bilateral hydronephrosis status post ureteral stent and right nephrostomy tube. Patient presented with creatinine of 2.07, last creatinine was 1.09 on 05/24/2023.   AKI resolved.  Metabolic acidosis resolved. Creatinine slightly up today 1.21 Dr. Cheryle has discussed with Dr. Borden/urology on 06/10/2023: He reviewed prior records, notes and imaging and stated that patient has an almost atrophic/nonfunctional left kidney with possible chronic hydronephrosis and patient would not need any procedure for left hydronephrosis.  As long as right nephrostomy tube is draining and kidney function is improving, Patient will need to follow-up with urology as an outpatient. -Urology evaluation by Dr. Gaston on 06/13/2023 appreciated: Recommended to continue nephrostomy tube for now and outpatient follow-up with regular urologist in [facility]. -UA suggestive of possible UTI.  Completed 5 days of Rocephin .  Urine culture grew multiple species.   Hypothyroidism: Continue levothyroxine    Hyperlipidemia: Hold statin due to transaminites.   Transaminitis: -LFTs continue to trend upwards.  CT imaging showed no biliary dilatation.   TPN discontinued.  gentle hydration with normal saline.  GI agreed. TPN resumed. LFTs improving.   Nausea and vomiting: -Has been an ongoing chronic issue for her due to her complex abdominal surgical  history.  No small bowel obstruction demonstrated on CT imaging on presentation.   Continue as needed antiemetics.  Currently improving.  Outpatient follow-up with primary surgical team.    Failure to thrive in adult: --Pt has extensive abdominal surgery due to hx of cervical cancer and currently on TPN . -Palliative care evaluation appreciated.  Patient remains full code.   History of cervical cancer and rectovaginal fistula status post surgical intervention -Currently has an ileostomy.  Outpatient follow-up with oncology/GYN oncology/general surgery   Anxiety/depression: -Continue mirtazapine  along with as needed Xanax . -Hold Zoloft  in the setting of hyponatremia   Physical deconditioning -Patient from SNF.  PT recommending SNF placement.  TOC following   DVT prophylaxis: Lovenox  Code Status: Full code Family Communication: No one at bedside Disposition Plan:    Status is: Inpatient Remains inpatient appropriate because: Severity of illness.   Consultants:  Neurology Gastroenterology Palliative care  Procedures: None Antimicrobials:  Anti-infectives (From admission, onward)    Start     Dose/Rate Route Frequency Ordered Stop   06/11/23 1200  cefTRIAXone  (ROCEPHIN ) 1 g in sodium chloride  0.9 % 100 mL IVPB        1 g 200 mL/hr over 30 Minutes Intravenous Every 24 hours 06/11/23 1154 06/15/23 1316      Subjective: Patient was seen and examined at bedside. Overnight events noted.   Patient reports doing better,  She was having bowel movement in the morning. TPN resumed yesterday and she is tolerating well.  Patient needs placement.  Objective: Vitals:   06/17/23 2210 06/18/23 0500 06/18/23 1243 06/18/23 1259  BP: 97/65  (!) 82/58 (!) 87/61  Pulse: 97  84 92  Resp: 18     Temp: 98.4 F (36.9 C)  98.2 F (36.8 C)   TempSrc: Oral  Oral   SpO2: 97%  100%   Weight:  46.2 kg    Height:        Intake/Output Summary (Last 24 hours) at 06/18/2023 1412 Last data filed at 06/18/2023 1245 Gross per 24 hour  Intake 1745.4 ml  Output 2150 ml  Net  -404.6 ml   Filed Weights   06/13/23 0500 06/16/23 0500 06/18/23 0500  Weight: 50.2 kg 46.6 kg 46.2 kg    Examination:  General exam: Appears calm and comfortable, deconditioned, not in any acute distress. Respiratory system: CTA Bilaterally.  Respiratory effort normal.  RR 16 Cardiovascular system: S1 & S2 heard, RRR. No JVD, murmurs, rubs, gallops or clicks.  Gastrointestinal system: Abdomen is non distended, soft and non tender. Normal bowel sounds heard. Central nervous system: Alert and oriented x 3. No focal neurological deficits. Extremities:  No Edema, no cyanosis, no clubbing Skin: No rashes, lesions or ulcers Psychiatry: Judgement and insight appear normal. Mood & affect appropriate.     Data Reviewed: I have personally reviewed following labs and imaging studies  CBC: Recent Labs  Lab 06/12/23 0301 06/13/23 0331 06/16/23 0308 06/17/23 0255 06/18/23 0432  WBC 7.3 5.9 4.6 4.5 4.4  NEUTROABS 4.2 4.2 2.5  --   --   HGB 10.1* 10.2* 10.4* 10.7* 9.9*  HCT 29.8* 29.4* 30.6* 33.9* 30.1*  MCV 88.7 87.8 90.3 91.4 91.8  PLT 217 202 190 203 177   Basic Metabolic Panel: Recent Labs  Lab 06/14/23 0352 06/15/23 0328 06/16/23 0308 06/17/23 0255 06/18/23 0432  NA 134* 130* 131* 128* 131*  K 3.0* 3.5 3.3* 3.1* 3.9  CL 96* 90* 91* 88* 93*  CO2 30 30 31 28  29  GLUCOSE 92 96 93 98 149*  BUN 48* 53* 45* 40* 47*  CREATININE 0.77 0.82 0.97 1.21* 1.24*  CALCIUM  8.7* 9.2 8.9 8.7* 8.9  MG 2.3 2.4 2.1 1.9 1.9  PHOS 2.5 2.7 3.3 3.3 3.6   GFR: Estimated Creatinine Clearance: 32.1 mL/min (A) (by C-G formula based on SCr of 1.24 mg/dL (H)). Liver Function Tests: Recent Labs  Lab 06/14/23 0352 06/15/23 0328 06/16/23 0308 06/17/23 0255 06/18/23 0432  AST 145* 234* 167* 223* 121*  ALT 302* 443* 399* 469* 343*  ALKPHOS 289* 378* 434* 518* 481*  BILITOT 0.4 0.4 0.5 0.7 0.3  PROT 5.9* 6.2* 6.5 6.8 6.3*  ALBUMIN  2.4* 2.8* 2.8* 2.9* 2.8*   No results for input(s): LIPASE,  AMYLASE in the last 168 hours.  No results for input(s): AMMONIA in the last 168 hours. Coagulation Profile: No results for input(s): INR, PROTIME in the last 168 hours. Cardiac Enzymes: No results for input(s): CKTOTAL, CKMB, CKMBINDEX, TROPONINI in the last 168 hours. BNP (last 3 results) No results for input(s): PROBNP in the last 8760 hours. HbA1C: No results for input(s): HGBA1C in the last 72 hours. CBG: Recent Labs  Lab 06/17/23 1215 06/17/23 1746 06/17/23 2353 06/18/23 0618 06/18/23 1153  GLUCAP 184* 208* 170* 155* 171*   Lipid Profile: Recent Labs    06/16/23 0308  TRIG 57   Thyroid  Function Tests: No results for input(s): TSH, T4TOTAL, FREET4, T3FREE, THYROIDAB in the last 72 hours. Anemia Panel: No results for input(s): VITAMINB12, FOLATE, FERRITIN, TIBC, IRON, RETICCTPCT in the last 72 hours. Sepsis Labs: No results for input(s): PROCALCITON, LATICACIDVEN in the last 168 hours.  Recent Results (from the past 240 hours)  Resp panel by RT-PCR (RSV, Flu A&B, Covid) Anterior Nasal Swab     Status: None   Collection Time: 06/09/23  8:47 PM   Specimen: Anterior Nasal Swab  Result Value Ref Range Status   SARS Coronavirus 2 by RT PCR NEGATIVE NEGATIVE Final    Comment: (NOTE) SARS-CoV-2 target nucleic acids are NOT DETECTED.  The SARS-CoV-2 RNA is generally detectable in upper respiratory specimens during the acute phase of infection. The lowest concentration of SARS-CoV-2 viral copies this assay can detect is 138 copies/mL. A negative result does not preclude SARS-Cov-2 infection and should not be used as the sole basis for treatment or other patient management decisions. A negative result may occur with  improper specimen collection/handling, submission of specimen other than nasopharyngeal swab, presence of viral mutation(s) within the areas targeted by this assay, and inadequate number of viral copies(<138  copies/mL). A negative result must be combined with clinical observations, patient history, and epidemiological information. The expected result is Negative.  Fact Sheet for Patients:  bloggercourse.com  Fact Sheet for Healthcare Providers:  seriousbroker.it  This test is no t yet approved or cleared by the [place]  FDA and  has been authorized for detection and/or diagnosis of SARS-CoV-2 by FDA under an Emergency Use Authorization (EUA). This EUA will remain  in effect (meaning this test can be used) for the duration of the COVID-19 declaration under Section 564(b)(1) of the Act, 21 U.S.C.section 360bbb-3(b)(1), unless the authorization is terminated  or revoked sooner.       Influenza A by PCR NEGATIVE NEGATIVE Final   Influenza B by PCR NEGATIVE NEGATIVE Final    Comment: (NOTE) The Xpert Xpress SARS-CoV-2/FLU/RSV plus assay is intended as an aid in the diagnosis of influenza from Nasopharyngeal swab specimens and should not be used  as a sole basis for treatment. Nasal washings and aspirates are unacceptable for Xpert Xpress SARS-CoV-2/FLU/RSV testing.  Fact Sheet for Patients: bloggercourse.com  Fact Sheet for Healthcare Providers: seriousbroker.it  This test is not yet approved or cleared by the [place]  FDA and has been authorized for detection and/or diagnosis of SARS-CoV-2 by FDA under an Emergency Use Authorization (EUA). This EUA will remain in effect (meaning this test can be used) for the duration of the COVID-19 declaration under Section 564(b)(1) of the Act, 21 U.S.C. section 360bbb-3(b)(1), unless the authorization is terminated or revoked.     Resp Syncytial Virus by PCR NEGATIVE NEGATIVE Final    Comment: (NOTE) Fact Sheet for Patients: bloggercourse.com  Fact Sheet for Healthcare  Providers: seriousbroker.it  This test is not yet approved or cleared by the [place]  FDA and has been authorized for detection and/or diagnosis of SARS-CoV-2 by FDA under an Emergency Use Authorization (EUA). This EUA will remain in effect (meaning this test can be used) for the duration of the COVID-19 declaration under Section 564(b)(1) of the Act, 21 U.S.C. section 360bbb-3(b)(1), unless the authorization is terminated or revoked.  Performed at [facility], [address], [place], [place] [address]   MRSA Next Gen by PCR, Nasal     Status: Abnormal   Collection Time: 06/10/23  2:30 AM   Specimen: Nasal Mucosa; Nasal Swab  Result Value Ref Range Status   MRSA by PCR Next Gen DETECTED (A) NOT DETECTED Final    Comment: (NOTE) The GeneXpert MRSA Assay (FDA approved for NASAL specimens only), is one component of a comprehensive MRSA colonization surveillance program. It is not intended to diagnose MRSA infection nor to guide or monitor treatment for MRSA infections. Test performance is not FDA approved in patients less than 39 years old. Performed at [facility], [address], [place], [place] [address]   Urine Culture (for pregnant, neutropenic or urologic patients or patients with an indwelling urinary catheter)     Status: Abnormal   Collection Time: 06/10/23  6:00 PM   Specimen: Urine, Bag (ped)  Result Value Ref Range Status   Specimen Description   Final    URINE, BAG PED Performed at [facility], [address] [place], [place] [address]    Special Requests   Final    NONE Performed at [facility], [address], [place], [place] [address]    Culture MULTIPLE SPECIES PRESENT, SUGGEST RECOLLECTION (A)  Final   Report Status 06/12/2023 FINAL  Final    Radiology Studies: No results found.  Scheduled Meds:  acetaminophen   1,000 mg Oral Once    Chlorhexidine  Gluconate Cloth  6 each Topical Daily   enoxaparin  (LOVENOX ) injection  40 mg Subcutaneous Q24H   feeding supplement  237 mL Oral BID BM   insulin  aspart  0-9 Units Subcutaneous Q6H   levothyroxine   25 mcg Oral Q0600   LORazepam   1 mg Intravenous Once   mirtazapine   7.5 mg Oral QHS   pantoprazole   40 mg Oral Daily   potassium chloride   20 mEq Oral Once   sodium chloride  flush  10-40 mL Intracatheter Q12H   thiamine   100 mg Oral Daily   Continuous Infusions:  sodium chloride  50 mL/hr at 06/17/23 2226   TPN (CLINIMIX -E) Adult     And   fat emul(SMOFlipid )     sodium chloride      TPN (CLINIMIX -E) Adult 42 mL/hr at 06/17/23 1744  LOS: 8 days    Time spent: 35 mins    Darcel Dawley, MD [facility]   If 7PM-7AM, please contact night-coverage

## 2023-06-18 NOTE — Progress Notes (Signed)
 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   Assessment:  68 y/o F with PMH of cervical cancer and rectovaginal fistula s/p redo low anterior resection with colovaginal fistula takedown, fistula repair, and hand sewn end to end sigmoidrectal anastomosis with loop ileostomy on 11/23/18; closure of loop ileostomy 05/01/20, SBO s/p ex-lap with lysis of adhesions on 11/08/20, recurrent SBO s/p ex-lap, LOA, distal ileum and cecum resection with end ileostomy creation on 03/14/2023 started on TPN, right hydronephrosis s/p R ureteral stent placement on 04/02/2023 with subsequent obstructive uropathy, UTI, AKI and bilateral hydronephrosis with placement of right nephrostomy tube on 05/08/2023 who presents with nausea/vomiting and hyponatremia. Pharmacy consulted to resume TPN on admission.   Glucose / Insulin :  No history of DM.  - on sSSI (used 9 units in the past 24 hrs) - CBGs last 24hr (goal <180): 97-208 (one reading >180) Electrolytes: Na 131, K improved to 3.9, Cl low 93,  - other lytes wnl including CorrCa Renal: scr stable at  1.24, BUN up to 47 Hepatic: note hx transaminitis in PMH  - AST/ALT down 121-343 (Depakote  d/ced om 12/28). GI team now suspects elevated LFTs is from course of ceftriaxone   - Alk phos elevated but trending down at 481 - Tbili wnl  - TG trending down 57 (12/30) - Albumin  low 2.8 Intake / Output; MIVF: I/O inaccurate as UOp unmeasured  - MIVF: NS @ 50 mL/hr - UOP: unmeasured 12/31  - Stool output: 2150 mL GI Imaging:  - 12/23 CT abdomen and pelvis: did not show SBO but showed moderate to marked severity right-sided hydronephrosis and proximal hydroureter with percutaneous right-sided nephrostomy tube  and right sided intraureteral stent in place along with mild to moderate severity left-sided hydronephrosis and proximal hydroureter with markedly dilated intrarenal calyces on the left.  GI Surgeries / Procedures: see above  Central access: PICC line in place TPN start date: continued from PTA  - 12/29: per MD's request, TPN stopped at 11a due to elevated LFTs.  - 12/30: Per Dr. Kriss, continue to hold TPN.  - 12/31: Per Dr. Kriss, resume TPN back today   Nutritional Goals: Due to the Baxter IV fluid disruption, all adult parenteral nutrition will utilize premade Clinimix  products +/- fat emulsion infusion. Our goal will be to continue providing as close to 100% of our patient's nutritional needs, but due to limited TPN Clinimix  concentrations we will meet at least 80% of protein and 75% of kcal goals.  Per discussion with [employer] and [employer], patient was receiving Clinimix  E 5/15 1000 mL over 24 hours and Intralipid 20% 250 mL over 12 hours while at facility   -Goal TPN rate (Clinimix  8/10) is 42 mL/hr (provides 80g of protein and 1164 kcals per day) - meets 100% of protein goal and 78% of kcal goals with TPN alone  -Also, each Ensure Plus supplement (ordered BID) provides 350 kcal and 20g of protein     RD Assessment: Estimated Needs Total Energy Estimated Needs: 1500-1700 Total Protein Estimated Needs: 75-90g Total Fluid Estimated Needs: 1.7L/day  Current Nutrition:  Regular diet Ensure Plus BID TPN to resume this PM  Plan:  At 1800: - continue  Clinimix  E 8/10 at 55mL/hr at 1800 -  Electrolytes in TPN (fixed; unable to adjust in premixed Clinimix ): Na 35mEq/L, K 30mEq/L, Ca 28mEq/L, Mg 59mEq/L, Phos 15mmol/L, Acetate 83 mEq/L, Cl 76 mEq/L - Add standard MVI and trace elements to TPN - SMOFlipid  20% 250mL IV over 12 hours.  - Continue  Sensitive q6h SSI for now - Thiamine  100mg  PO daily per RD recommendations - MIVF management per MD - BMP,  magnesium , phosphorus with AM labs - Monitor TPN labs on Mon/Thurs and prn. CMET, phos and Mag on 06/18/23    Etoy Mcdonnell, PharmD, BCPS 06/18/2023 11:18 AM

## 2023-06-18 NOTE — Progress Notes (Signed)
 Pt's ileostomy leaking, bag changed but ostomy cont to leak and pt c/os of skin burning around stoma. CN and AC notified of need for resources.

## 2023-06-18 NOTE — Progress Notes (Signed)
 [facility] Progress Note  SUBJECTIVE:   Interval history: [name] was seen and evaluated today at bedside. Patient's sister at bedside. Patient eating lunch at time of my evaluation with no issue. No nausea, vomiting. No chest pain, no shortness of breath. Denied abdominal pain.   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   Thyroid  disease    Vertigo    Past Surgical History:  Procedure Laterality Date   ABDOMINAL ADHESION SURGERY  11/08/2020   Dr Darral - [facility]   ABDOMINAL HYSTERECTOMY  2003   [facility] - Dr Ivery - for cervical cancer   BILE DUCT EXPLORATION  10/22/2003   CHOLECYSTECTOMY OPEN  10/22/2003   Dr Lorriane - CBD repair w T tube   CYSTOSCOPY  2021   ILEOSTOMY  11/23/2018   ILEOSTOMY CLOSURE  04/2020   LOW ANTERIOR BOWEL RESECTION  11/23/2018   LAR/takedown colovaginal fistula/loop ileostomy  - [facility]   Current Facility-Administered Medications  Medication Dose Route Frequency Provider Last Rate Last Admin   0.9 %  sodium chloride  infusion   Intravenous Continuous Cheryle Page, MD 50 mL/hr at 06/17/23 2226 New Bag at 06/17/23 2226   acetaminophen  (TYLENOL ) tablet 1,000 mg  1,000 mg Oral Once Nivia Colon, PA-C       acetaminophen  (TYLENOL ) tablet 650 mg  650 mg Oral Q6H PRN Cheryle Page, MD   650 mg at 06/17/23 1226   ALPRAZolam  (XANAX ) tablet 0.5 mg  0.5 mg Oral TID PRN Cheryle Page, MD   0.5 mg at 06/17/23 1226   Chlorhexidine  Gluconate Cloth 2 % PADS 6 each  6 each Topical Daily Andrez Chroman, NP   6 each at 06/17/23 0819   enoxaparin  (LOVENOX ) injection 40 mg  40 mg Subcutaneous Q24H Pham, Anh P, RPH   40 mg at 06/17/23 1227   TPN (CLINIMIX -E) Adult   Intravenous Continuous TPN Leotis Bogus, MD       And   fat emulsion 20 % (SMOFLIPID ) infusion  250 mL Intravenous Continuous TPN Leotis,  Pardeep, MD       feeding supplement (ENSURE ENLIVE / ENSURE PLUS) liquid 237 mL  237 mL Oral BID BM Alekh, Kshitiz, MD   237 mL at 06/17/23 1224   haloperidol  lactate (HALDOL ) injection 1 mg  1 mg Intravenous Q6H PRN Cheryle Page, MD   1 mg at 06/18/23 0119   insulin  aspart (novoLOG ) injection 0-9 Units  0-9 Units Subcutaneous Q6H Gadhia, Jigna M, RPH   2 Units at 06/18/23 9363   levothyroxine  (SYNTHROID ) tablet 25 mcg  25 mcg Oral Q0600 Tu, Ching T, DO   25 mcg at 06/18/23 0636   lip balm (CARMEX) ointment   Topical PRN Ricky Pax T, DO   1 Application at 06/10/23 0540   LORazepam  (ATIVAN ) injection 1 mg  1 mg Intravenous Once Alekh, Kshitiz, MD       mirtazapine  (REMERON ) tablet 7.5 mg  7.5 mg Oral QHS Tu, Ching T, DO   7.5 mg at 06/17/23 2218   ondansetron  (ZOFRAN ) injection 4 mg  4 mg Intravenous Q6H PRN Tu, Ching T, DO   4 mg at 06/15/23 0411   Oral care mouth rinse  15 mL Mouth Rinse PRN Cheryle Page, MD       pantoprazole  (PROTONIX ) EC tablet  40 mg  40 mg Oral Daily Tu, Ching T, DO   40 mg at 06/17/23 1227   potassium chloride  (KLOR-CON ) packet 20 mEq  20 mEq Oral Once Pham, Anh P, RPH       sodium chloride  flush (NS) 0.9 % injection 10-40 mL  10-40 mL Intracatheter Q12H Alekh, Kshitiz, MD   10 mL at 06/16/23 2114   thiamine  (VITAMIN B1) tablet 100 mg  100 mg Oral Daily Cheryle Page, MD   100 mg at 06/17/23 1227   TPN (CLINIMIX -E) Adult   Intravenous Continuous TPN Osie Iantha SQUIBB, RPH 42 mL/hr at 06/17/23 1744 New Bag at 06/17/23 1744   Allergies as of 06/09/2023 - Review Complete 06/09/2023  Allergen Reaction Noted   Barium-containing compounds  03/17/2019   Percocet [oxycodone-acetaminophen ] Itching and Other (See Comments) 03/04/2014   Review of Systems:  Review of Systems  Respiratory:  Negative for shortness of breath.   Cardiovascular:  Negative for chest pain.  Gastrointestinal:  Negative for abdominal pain, nausea and vomiting.    OBJECTIVE:   Temp:  [98.4 F (36.9 C)]  98.4 F (36.9 C) (12/31 2210) Pulse Rate:  [97] 97 (12/31 2210) Resp:  [18] 18 (12/31 2210) BP: (97)/(65) 97/65 (12/31 2210) SpO2:  [97 %] 97 % (12/31 2210) Weight:  [46.2 kg] 46.2 kg (01/01 0500) Last BM Date : 06/17/23 Physical Exam Constitutional:      General: She is not in acute distress.    Appearance: She is not ill-appearing, toxic-appearing or diaphoretic.  Cardiovascular:     Rate and Rhythm: Normal rate and regular rhythm.  Pulmonary:     Effort: No respiratory distress.     Breath sounds: Normal breath sounds.  Abdominal:     General: Bowel sounds are normal. There is no distension.     Palpations: Abdomen is soft.     Tenderness: There is no abdominal tenderness. There is no guarding.  Neurological:     Mental Status: She is alert.     Labs: Recent Labs    06/16/23 0308 06/17/23 0255 06/18/23 0432  WBC 4.6 4.5 4.4  HGB 10.4* 10.7* 9.9*  HCT 30.6* 33.9* 30.1*  PLT 190 203 177   BMET Recent Labs    06/16/23 0308 06/17/23 0255 06/18/23 0432  NA 131* 128* 131*  K 3.3* 3.1* 3.9  CL 91* 88* 93*  CO2 31 28 29   GLUCOSE 93 98 149*  BUN 45* 40* 47*  CREATININE 0.97 1.21* 1.24*  CALCIUM  8.9 8.7* 8.9   LFT Recent Labs    06/18/23 0432  PROT 6.3*  ALBUMIN  2.8*  AST 121*  ALT 343*  ALKPHOS 481*  BILITOT 0.3   PT/INR No results for input(s): LABPROT, INR in the last 72 hours. Diagnostic imaging: No results found.  IMPRESSION: Transaminase elevation in mixed pattern             -Etiology undetermined, TPN vs medication induced (Rocephin  12/25/-12/30, Haldol  12/24-12/29) versus other  -Ceruloplasmin wnl, alpha-1 antitrypsin wnl, acute hepatitis panel negative, IgG wnl  -Trend marginally improved today  History cervical cancer 2003  -Treated with pelvic radiation therapy c/b colovaginal fistula repaired in 2019 with loop ileostomy -Required redo low anterior resection with fistula repair and end-to-end sigmoidrectal anastomosis  11/23/2018 Adhesions secondary to previous abdominal surgeries  -S/p ex-lap with lysis of adhesions, distal ileum and cecum resection with end ileostomy creation on 03/14/23 Small bowel obstruction May 2022 requiring lysis of adhesions, small bowel obstruction June 2024 treated  medically 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

## 2023-06-18 NOTE — Plan of Care (Signed)
  Problem: Education: Goal: Knowledge of General Education information will improve Description: Including pain rating scale, medication(s)/side effects and non-pharmacologic comfort measures Outcome: Progressing   Problem: Clinical Measurements: Goal: Respiratory complications will improve Outcome: Progressing   Problem: Activity: Goal: Risk for activity intolerance will decrease Outcome: Progressing   Problem: Pain Management: Goal: General experience of comfort will improve Outcome: Progressing   Problem: Coping: Goal: Level of anxiety will decrease Outcome: Not Progressing

## 2023-06-19 DIAGNOSIS — E871 Hypo-osmolality and hyponatremia: Secondary | ICD-10-CM | POA: Diagnosis not present

## 2023-06-19 LAB — COMPREHENSIVE METABOLIC PANEL
ALT: 288 U/L — ABNORMAL HIGH (ref 0–44)
AST: 116 U/L — ABNORMAL HIGH (ref 15–41)
Albumin: 2.8 g/dL — ABNORMAL LOW (ref 3.5–5.0)
Alkaline Phosphatase: 441 U/L — ABNORMAL HIGH (ref 38–126)
Anion gap: 10 (ref 5–15)
BUN: 56 mg/dL — ABNORMAL HIGH (ref 8–23)
CO2: 25 mmol/L (ref 22–32)
Calcium: 9.2 mg/dL (ref 8.9–10.3)
Chloride: 95 mmol/L — ABNORMAL LOW (ref 98–111)
Creatinine, Ser: 1.23 mg/dL — ABNORMAL HIGH (ref 0.44–1.00)
GFR, Estimated: 48 mL/min — ABNORMAL LOW (ref >60)
Glucose, Bld: 102 mg/dL — ABNORMAL HIGH (ref 70–99)
Potassium: 3.5 mmol/L (ref 3.5–5.1)
Sodium: 130 mmol/L — ABNORMAL LOW (ref 135–145)
Total Bilirubin: 0.4 mg/dL (ref 0.0–1.2)
Total Protein: 6.2 g/dL — ABNORMAL LOW (ref 6.5–8.1)

## 2023-06-19 LAB — GLUCOSE, CAPILLARY
Glucose-Capillary: 108 mg/dL — ABNORMAL HIGH (ref 70–99)
Glucose-Capillary: 174 mg/dL — ABNORMAL HIGH (ref 70–99)
Glucose-Capillary: 158 mg/dL — ABNORMAL HIGH (ref 70–99)

## 2023-06-19 LAB — CBC
HCT: 30.7 % — ABNORMAL LOW (ref 36.0–46.0)
Hemoglobin: 10 g/dL — ABNORMAL LOW (ref 12.0–15.0)
MCH: 29.9 pg (ref 26.0–34.0)
MCHC: 32.6 g/dL (ref 30.0–36.0)
MCV: 91.9 fL (ref 80.0–100.0)
Platelets: 216 10*3/uL (ref 150–400)
RBC: 3.34 MIL/uL — ABNORMAL LOW (ref 3.87–5.11)
RDW: 14.9 % (ref 11.5–15.5)
WBC: 4.7 10*3/uL (ref 4.0–10.5)
nRBC: 0 % (ref 0.0–0.2)

## 2023-06-19 LAB — MITOCHONDRIAL ANTIBODIES: Mitochondrial M2 Ab, IgG: <20 U (ref 0.0–20.0)

## 2023-06-19 LAB — PHOSPHORUS: Phosphorus: 3.7 mg/dL (ref 2.5–4.6)

## 2023-06-19 LAB — MAGNESIUM: Magnesium: 2 mg/dL (ref 1.7–2.4)

## 2023-06-19 LAB — ANA: Anti Nuclear Antibody (ANA): NEGATIVE

## 2023-06-19 LAB — ANTI-SMOOTH MUSCLE ANTIBODY, IGG: F-Actin IgG: 12 U (ref 0–19)

## 2023-06-19 MED ORDER — FAT EMUL FISH OIL/PLANT BASED 20% (SMOFLIPID)IV EMUL
250.0000 mL | INTRAVENOUS | Status: AC
  Administered 2023-06-19: 250 mL via INTRAVENOUS
  Filled 2023-06-19: qty 250

## 2023-06-19 MED ORDER — TRACE MINERALS CU-MN-SE-ZN 300-55-60-3000 MCG/ML IV SOLN
INTRAVENOUS | Status: AC
  Filled 2023-06-19 (×2): qty 1000

## 2023-06-19 NOTE — TOC Progression Note (Signed)
 Transition of Care [facility]) - Progression Note    Patient Details  Name: [name] MRN: [id number] Date of Birth: [birth year]  [facility]) CM/SW Contact  Tawni [name], LCSW Phone Number: 06/19/2023, 12:43 PM  Clinical Narrative:    CSW spoke with the pt and her husband and informed them that there are no facilities close to them that will accept the pt on TPN. CSW explained that the insurance authorization will need to start today for [facility], as the pt is ready for discharge.   CSW sent message to [facility] admission with  [facility], shara will be started today. Auth is pending, TOC to follow.   Expected Discharge Plan: Skilled Nursing Facility Barriers to Discharge: Continued Medical Work up  Expected Discharge Plan and Services       Living arrangements for the past 2 months: Single Family Home                                       Social Determinants of Health (SDOH) Interventions SDOH Screenings   Food Insecurity: No Food Insecurity (06/10/2023)  Housing: Low Risk  (06/10/2023)  Transportation Needs: No Transportation Needs (06/10/2023)  Utilities: Not At Risk (06/10/2023)  Depression (PHQ2-9): Low Risk  (12/06/2022)  Financial Resource Strain: Low Risk  (11/06/2022)  Social Connections: Patient Unable To Answer (06/18/2023)  Stress: No Stress Concern Present (11/06/2022)  Tobacco Use: Medium Risk (06/09/2023)    Readmission Risk Interventions    11/21/2022   10:10 AM 11/18/2022    2:11 PM  Readmission Risk Prevention Plan  Post Dischage Appt  Complete  Medication Screening  Complete  Transportation Screening Complete Complete  HRI or Home Care Consult Complete   Social Work Consult for Recovery Care Planning/Counseling Complete   Palliative Care Screening Not Applicable   Medication Review Oceanographer) Complete

## 2023-06-19 NOTE — Progress Notes (Signed)
 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   Assessment:  68 y/o F with PMH of cervical cancer and rectovaginal fistula s/p redo low anterior resection with colovaginal fistula takedown, fistula repair, and hand sewn end to end sigmoidrectal anastomosis with loop ileostomy on 11/23/18; closure of loop ileostomy 05/01/20, SBO s/p ex-lap with lysis of adhesions on 11/08/20, recurrent SBO s/p ex-lap, LOA, distal ileum and cecum resection with end ileostomy creation on 03/14/2023 started on TPN, right hydronephrosis s/p R ureteral stent placement on 04/02/2023 with subsequent obstructive uropathy, UTI, AKI and bilateral hydronephrosis with placement of right nephrostomy tube on 05/08/2023 who presents with nausea/vomiting and hyponatremia. Pharmacy consulted to resume TPN on admission.   Glucose / Insulin :  No history of DM.  - on sSSI (used 4 units in the past 24 hrs) - CBGs last 24hr (goal <180): 107-197(one reading >180) Electrolytes: Na 130, K+ dropped to 3.5, Cl low 95,  - other lytes wnl including CorrCa Renal: scr stable at  1.23, BUN up to 56 Hepatic: note hx transaminitis in PMH  - AST/ALT down 116-288 (Depakote  d/ced om 12/28). GI team now suspects elevated LFTs is from course of ceftriaxone   - Alk phos elevated but trending down at 441 - Tbili wnl  - TG trending down 57 (12/30) - Albumin  low 2.8 Intake / Output; MIVF: I/O may not be accurate as UOP may not be fully charted  - MIVF: NS @ 50 mL/hr - UOP: 400 mL charted, one occurrence - Stool output: 2150 mL GI Imaging:  - 12/23 CT abdomen and pelvis: did not show SBO but showed moderate to marked severity right-sided hydronephrosis and proximal hydroureter with  percutaneous right-sided nephrostomy tube and right sided intraureteral stent in place along with mild to moderate severity left-sided hydronephrosis and proximal hydroureter with markedly dilated intrarenal calyces on the left.  GI Surgeries / Procedures: see above  Central access: PICC line in place TPN start date: continued from PTA  - 12/29: per MD's request, TPN stopped at 11a due to elevated LFTs.  - 12/30: Per Dr. Kriss, continue to hold TPN.  - 12/31: Per Dr. Kriss, resume TPN back today   Nutritional Goals: Due to the Baxter IV fluid disruption, all adult parenteral nutrition will utilize premade Clinimix  products +/- fat emulsion infusion. Our goal will be to continue providing as close to 100% of our patient's nutritional needs, but due to limited TPN Clinimix  concentrations we will meet at least 80% of protein and 75% of kcal goals.  Per discussion with [employer] [facility] [employer], patient was receiving Clinimix  E 5/15 1000 mL over 24 hours and Intralipid 20% 250 mL over 12 hours while at facility   -Goal TPN rate (Clinimix  8/10) is 42 mL/hr (provides 80g of protein and 1164 kcals per day) - meets 100% of protein goal and 78% of kcal goals with TPN alone  -Also, each Ensure Plus supplement (ordered BID) provides 350 kcal and 20g of protein     RD Assessment: Estimated Needs Total Energy Estimated Needs: 1500-1700 Total Protein Estimated Needs: 75-90g Total Fluid Estimated Needs: 1.7L/day  Current Nutrition:  Regular diet Ensure Plus BID TPN to resume this PM  Plan:  - MD ordered 20  meq PO x 1 of KCl  At 1800: - continue  Clinimix  E 8/10 at 19mL/hr at 1800 - Electrolytes in TPN (fixed; unable to adjust in premixed Clinimix ): Na 71mEq/L, K 50mEq/L, Ca 27mEq/L, Mg 69mEq/L, Phos 15mmol/L, Acetate 83 mEq/L, Cl 76 mEq/L - Add standard MVI and trace elements to TPN - SMOFlipid  20% 250mL IV over 12 hours.  - Continue  Sensitive q6h SSI for now - Thiamine   100mg  PO daily per RD recommendations - MIVF management per MD - BMP, magnesium , phosphorus with AM labs - Monitor TPN labs on Mon/Thurs and prn. CMET, phos and Mag on 06/18/23    Roxene Alviar, PharmD, BCPS 06/19/2023 11:20 AM

## 2023-06-19 NOTE — Plan of Care (Signed)
  Problem: Clinical Measurements: Goal: Ability to maintain clinical measurements within normal limits will improve Outcome: Progressing Goal: Diagnostic test results will improve Outcome: Progressing Goal: Respiratory complications will improve Outcome: Progressing Goal: Cardiovascular complication will be avoided Outcome: Progressing   Problem: Activity: Goal: Risk for activity intolerance will decrease Outcome: Progressing   Problem: Nutrition: Goal: Adequate nutrition will be maintained Outcome: Progressing   Problem: Coping: Goal: Level of anxiety will decrease Outcome: Progressing   Problem: Elimination: Goal: Will not experience complications related to urinary retention Outcome: Progressing   Problem: Education: Goal: Knowledge of General Education information will improve Description: Including pain rating scale, medication(s)/side effects and non-pharmacologic comfort measures Outcome: Not Progressing   Problem: Health Behavior/Discharge Planning: Goal: Ability to manage health-related needs will improve Outcome: Not Progressing

## 2023-06-19 NOTE — Progress Notes (Signed)
 Physical Therapy Treatment Patient Details Name: [name] MRN: [id number] DOB: [birth year] Today's Date: 06/19/2023   History of Present Illness Pt is a 68 year old woman admitted 12/23 from [facility], where she was receiving rehab, with hyponatremia and AKI associated with nausea and vomiting. PMH: cervical cancer, colovaginal fistula, multiple abdominal surgeries, recent nephrostomy due to hydronephrosis, CKD, anxiety, headaches, UTI.    PT Comments  Patient verbally aggressive , states  family is stabbing me in the back:, I might just fall down when I am walking.  Patient amenable to ambulate using RW x 180'. Gait steady and moves quickly.  Patient will benefit from continued inpatient follow up therapy, <3 hours/day.   If plan is discharge home, recommend the following: A little help with walking and/or transfers;A little help with bathing/dressing/bathroom;Assist for transportation;Assistance with cooking/housework;Help with stairs or ramp for entrance   Can travel by private vehicle     No  Equipment Recommendations  None recommended by PT    Recommendations for Other Services       Precautions / Restrictions Precautions Precautions: Fall Precaution Comments: R ileostomy, R nephrostomy drain Restrictions Weight Bearing Restrictions Per Provider Order: No     Mobility  Bed Mobility               General bed mobility comments: in recliner    Transfers Overall transfer level: Needs assistance Equipment used: None Transfers: Sit to/from Stand Sit to Stand: Supervision           General transfer comment: Little regard for lines/drains/safety. Cues for safety,, to wait for RW .    Ambulation/Gait Ambulation/Gait assistance: Contact guard assist Gait Distance (Feet): 180 Feet Assistive device: Rolling walker (2 wheels) Gait Pattern/deviations: Step-through pattern Gait velocity: increased     General Gait Details: patient nambulating  quickly, turning quickly, cues for safety as therapist kept up with lines   Stairs             Wheelchair Mobility     Tilt Bed    Modified Rankin (Stroke Patients Only)       Balance     Sitting balance-Leahy Scale: Fair     Standing balance support: No upper extremity supported Standing balance-Leahy Scale: Fair                              Cognition Arousal: Alert Behavior During Therapy: Agitated, Impulsive Overall Cognitive Status: Impaired/Different from baseline Area of Impairment: Orientation, Attention, Memory, Following commands, Safety/judgement, Awareness, Problem solving                   Current Attention Level: Focused Memory: Decreased short-term memory, Decreased recall of precautions Following Commands: Follows one step commands with increased time, Follows one step commands inconsistently Safety/Judgement: Decreased awareness of safety, Decreased awareness of deficits     General Comments: Patiwnt  anxious and impulsive, negative comments about  spouse(in room), states she will fall while walking. Redirection attrempted, patient  did   participate reluctantly. Cues to wait  for therapy to get drains and lines in order        Exercises      General Comments        Pertinent Vitals/Pain Pain Assessment Faces Pain Scale: Hurts little more Pain Location: back drain site Pain Descriptors / Indicators: Discomfort Pain Intervention(s): Monitored during session, Repositioned    Home Living  Prior Function            PT Goals (current goals can now be found in the care plan section) Progress towards PT goals: Progressing toward goals    Frequency    Min 1X/week      PT Plan      Co-evaluation              AM-PAC PT 6 Clicks Mobility   Outcome Measure  Help needed turning from your back to your side while in a flat bed without using bedrails?: A Little Help  needed moving from lying on your back to sitting on the side of a flat bed without using bedrails?: A Little Help needed moving to and from a bed to a chair (including a wheelchair)?: A Little Help needed standing up from a chair using your arms (e.g., wheelchair or bedside chair)?: A Little Help needed to walk in hospital room?: A Little Help needed climbing 3-5 steps with a railing? : A Lot 6 Click Score: 17    End of Session Equipment Utilized During Treatment: Gait belt Activity Tolerance: Patient tolerated treatment well Patient left: in chair;with call bell/phone within reach;with chair alarm set;with family/visitor present Nurse Communication: Mobility status PT Visit Diagnosis: Unsteadiness on feet (R26.81);Muscle weakness (generalized) (M62.81);Difficulty in walking, not elsewhere classified (R26.2);Pain     Time: 1433-1450 PT Time Calculation (min) (ACUTE ONLY): 17 min  Charges:    $Gait Training: 8-22 mins PT General Charges $$ ACUTE PT VISIT: 1 Visit                    Darice Potters PT Acute Rehabilitation Services Office [phone] Weekend pager-[phone]   Potters Darice Norris 06/19/2023, 2:59 PM

## 2023-06-19 NOTE — Consult Note (Signed)
 WOC Nurse ostomy consult note Stoma type/location: RLQ loop ileostomy Stomal assessment/size: 1 pink and moist  located in crease in abdomen Peristomal assessment:  partial thickness tissue loss from 6 to 10 o'clock Treatment options for stomal/peristomal skin: stoma powder and skin prep x2  barrier ring 2 piece convex pouch Output liquid brown stool Ostomy pouching: 2pc. 2 1/4 convex pouch with barrier ring   Education provided: Recommend ostomy belt  will need size medium.  Could follow up and see patient in ostomy clinic.  Patient is irritated and curt with spouse who is trying to be offer information regarding ostomy care and leakage.  She is not receptive to teaching at this time.   Enrolled patient in [employer] DC program: Yes previously Will not follow at this time.  Please re-consult if needed.  Darice Cooley MSN, RN, FNP-BC CWON Wound, Ostomy, Continence Nurse Outpatient [facility] [phone] Pager [phone]

## 2023-06-19 NOTE — Progress Notes (Signed)
 PROGRESS NOTE    [name]  FMW:[id number] DOB: 09-Mar-1956 DOA: 06/09/2023 PCP: Patient, [name]   Brief Narrative:  This 68 yrs old female with medical history significant of cervical cancer and rectovaginal fistula s/p redo low anterior resection with colovaginal fistula takedown, fistula repair, and hand sewn end to end sigmoidrectal anastomosis with loop ileostomy on 11/23/18; closure of loop ileostomy 05/01/20, SBO s/p ex-lap with lysis of adhesions on 11/08/20, recurrent SBO s/p ex-lap, LOA, distal ileum and cecum resection with end ileostomy creation on 03/14/2023 started on TPN, right hydronephrosis s/p R ureteral stent placement on 04/02/2023 with subsequent obstructive uropathy, UTI, AKI and bilateral hydronephrosis with placement of right nephrostomy tube on 05/08/2023 presented with nausea, vomiting and hyponatremia from SNF.  Patient has been on TPN recently after her recent hospitalization at [name] and discharged to SNF.  On presentation, she was tachypneic with sodium of 121 (last sodium on 05/24/2023 in care everywhere was 130).  Creatinine was 2.07 (last creatinine was 1.09 on 05/24/2023) with bicarb of 18.  CT of abdomen and pelvis did not show SBO but showed moderate to marked severity right-sided hydronephrosis and proximal hydroureter with percutaneous right-sided nephrostomy tube and right sided intraureteral stent in place along with mild to moderate severity left-sided hydronephrosis and proximal hydroureter with markedly dilated intrarenal calyces on the left.  She was started on IV fluids.  TPN was resumed.  She was started on IV antibiotics for possible UTI.  Urology was also consulted and recommended conservative management and outpatient follow-up.  PT recommending SNF placement.  TOC following.   Assessment & Plan:   Principal Problem:   Hyponatremia Active Problems:   Acute-on-chronic kidney injury (HCC)   Hypothyroidism   Mixed  hyperlipidemia   Failure to thrive in adult   Nausea & vomiting   Transaminitis   Metabolic acidosis with normal anion gap and bicarbonate losses   Bilateral hydronephrosis   Tremors of nervous system  Hyponatremia: -Possibly due to decreased oral intake and likely GI loss. -Sodium 121 on presentation.  Sodium 130 this morning.   -Continue IV fluids as below.  Encourage oral intake.  DC'd sertraline .   AKI on CKD stage IIIa:  Acute metabolic acidosis > Resolved. Possible UTI: POA  History of bilateral hydronephrosis status post ureteral stent and right nephrostomy tube. Patient presented with creatinine of 2.07, last creatinine was 1.09 on 05/24/2023.   AKI resolved.  Metabolic acidosis resolved. Creatinine slightly up today 1.21 Dr. Cheryle has discussed with Dr. Borden/urology on 06/10/2023: He reviewed prior records, notes and imaging and stated that patient has an almost atrophic/nonfunctional left kidney with possible chronic hydronephrosis and patient would not need any procedure for left hydronephrosis.  As long as right nephrostomy tube is draining and kidney function is improving, Patient will need to follow-up with urology as an outpatient. -Urology evaluation by Dr. Gaston on 06/13/2023 appreciated: Recommended to continue nephrostomy tube for now and outpatient follow-up with regular urologist in [facility]. -UA suggestive of possible UTI.  Completed 5 days of Rocephin .  Urine culture grew multiple species.   Hypothyroidism: Continue levothyroxine .   Hyperlipidemia: Hold statin due to transaminites.   Transaminitis: -LFTs continued to trend upwards.  CT imaging showed no biliary dilatation.   TPN discontinued.  gentle hydration with normal saline.  GI agreed. TPN resumed. LFTs improving.   Nausea and vomiting: -Has been an ongoing chronic issue for her due to her complex abdominal surgical history.  No small bowel obstruction demonstrated on CT imaging on presentation.   Continue as needed antiemetics.  Currently improving.  Outpatient follow-up with primary surgical team.    Failure to thrive in adult: --Pt has extensive abdominal surgery due to hx of cervical cancer and currently on TPN . -Palliative care evaluation appreciated.  Patient remains full code.   History of cervical cancer and rectovaginal fistula status post surgical intervention -Currently has an ileostomy.  Outpatient follow-up with oncology/GYN oncology/general surgery   Anxiety/depression: -Continue mirtazapine  along with as needed Xanax . -Hold Zoloft  in the setting of hyponatremia.   Physical deconditioning -Patient from SNF.  PT recommending SNF placement.  TOC following   DVT prophylaxis: Lovenox  Code Status: Full code Family Communication: No one at bedside Disposition Plan:    Status is: Inpatient Remains inpatient appropriate because: Severity of illness.   Consultants:  Neurology Gastroenterology Palliative care  Procedures: None Antimicrobials:  Anti-infectives (From admission, onward)    Start     Dose/Rate Route Frequency Ordered Stop   06/11/23 1200  cefTRIAXone  (ROCEPHIN ) 1 g in sodium chloride  0.9 % 100 mL IVPB        1 g 200 mL/hr over 30 Minutes Intravenous Every 24 hours 06/11/23 1154 06/15/23 1316      Subjective: Patient was seen and examined at bedside. Overnight events noted.   Patient reports doing better.  She has regular bowel movements. TPN is resumed and been working fine. Patient is awaiting placement to facility which accept the TPN.  Objective: Vitals:   06/18/23 2231 06/19/23 0437 06/19/23 0609 06/19/23 0917  BP: (!) 89/56 (!) 81/60 (!) 92/41 (!) 91/54  Pulse: 67 81  82  Resp: 16 15    Temp: 98 F (36.7 C) (!) 97.4 F (36.3 C)    TempSrc: Oral Oral    SpO2: 100% 100%    Weight:      Height:        Intake/Output Summary (Last 24 hours) at 06/19/2023 1245 Last data filed at 06/19/2023 1039 Gross per 24 hour  Intake 1749.82 ml   Output 2775 ml  Net -1025.18 ml   Filed Weights   06/13/23 0500 06/16/23 0500 06/18/23 0500  Weight: 50.2 kg 46.6 kg 46.2 kg    Examination:  General exam: Appears calm and comfortable, deconditioned, not in any acute distress. Respiratory system: CTA Bilaterally.  Respiratory effort normal.  RR 16 Cardiovascular system: S1 & S2 heard, RRR. No JVD, murmurs, rubs, gallops or clicks.  Gastrointestinal system: Abdomen is non distended, soft and non tender. Normal bowel sounds heard. Central nervous system: Alert and oriented x 3. No focal neurological deficits. Extremities:  No Edema, no cyanosis, no clubbing Skin: No rashes, lesions or ulcers Psychiatry: Judgement and insight appear normal. Mood & affect appropriate.     Data Reviewed: I have personally reviewed following labs and imaging studies  CBC: Recent Labs  Lab 06/13/23 0331 06/16/23 0308 06/17/23 0255 06/18/23 0432 06/19/23 0419  WBC 5.9 4.6 4.5 4.4 4.7  NEUTROABS 4.2 2.5  --   --   --   HGB 10.2* 10.4* 10.7* 9.9* 10.0*  HCT 29.4* 30.6* 33.9* 30.1* 30.7*  MCV 87.8 90.3 91.4 91.8 91.9  PLT 202 190 203 177 216   Basic Metabolic Panel: Recent Labs  Lab 06/15/23 0328 06/16/23 0308 06/17/23 0255 06/18/23 0432 06/19/23 0419  NA 130* 131* 128* 131* 130*  K 3.5 3.3* 3.1* 3.9 3.5  CL 90* 91* 88* 93* 95*  CO2 30  31 28 29 25   GLUCOSE 96 93 98 149* 102*  BUN 53* 45* 40* 47* 56*  CREATININE 0.82 0.97 1.21* 1.24* 1.23*  CALCIUM  9.2 8.9 8.7* 8.9 9.2  MG 2.4 2.1 1.9 1.9 2.0  PHOS 2.7 3.3 3.3 3.6 3.7   GFR: Estimated Creatinine Clearance: 32.4 mL/min (A) (by C-G formula based on SCr of 1.23 mg/dL (H)). Liver Function Tests: Recent Labs  Lab 06/15/23 0328 06/16/23 0308 06/17/23 0255 06/18/23 0432 06/19/23 0419  AST 234* 167* 223* 121* 116*  ALT 443* 399* 469* 343* 288*  ALKPHOS 378* 434* 518* 481* 441*  BILITOT 0.4 0.5 0.7 0.3 0.4  PROT 6.2* 6.5 6.8 6.3* 6.2*  ALBUMIN  2.8* 2.8* 2.9* 2.8* 2.8*   No  results for input(s): LIPASE, AMYLASE in the last 168 hours.  No results for input(s): AMMONIA in the last 168 hours. Coagulation Profile: No results for input(s): INR, PROTIME in the last 168 hours. Cardiac Enzymes: No results for input(s): CKTOTAL, CKMB, CKMBINDEX, TROPONINI in the last 168 hours. BNP (last 3 results) No results for input(s): PROBNP in the last 8760 hours. HbA1C: No results for input(s): HGBA1C in the last 72 hours. CBG: Recent Labs  Lab 06/18/23 1153 06/18/23 1751 06/18/23 2332 06/19/23 0518 06/19/23 1158  GLUCAP 171* 197* 107* 108* 174*   Lipid Profile: No results for input(s): CHOL, HDL, LDLCALC, TRIG, CHOLHDL, LDLDIRECT in the last 72 hours.  Thyroid  Function Tests: No results for input(s): TSH, T4TOTAL, FREET4, T3FREE, THYROIDAB in the last 72 hours. Anemia Panel: No results for input(s): VITAMINB12, FOLATE, FERRITIN, TIBC, IRON, RETICCTPCT in the last 72 hours. Sepsis Labs: No results for input(s): PROCALCITON, LATICACIDVEN in the last 168 hours.  Recent Results (from the past 240 hours)  Resp panel by RT-PCR (RSV, Flu A&B, Covid) Anterior Nasal Swab     Status: None   Collection Time: 06/09/23  8:47 PM   Specimen: Anterior Nasal Swab  Result Value Ref Range Status   SARS Coronavirus 2 by RT PCR NEGATIVE NEGATIVE Final    Comment: (NOTE) SARS-CoV-2 target nucleic acids are NOT DETECTED.  The SARS-CoV-2 RNA is generally detectable in upper respiratory specimens during the acute phase of infection. The lowest concentration of SARS-CoV-2 viral copies this assay can detect is 138 copies/mL. A negative result does not preclude SARS-Cov-2 infection and should not be used as the sole basis for treatment or other patient management decisions. A negative result may occur with  improper specimen collection/handling, submission of specimen other than nasopharyngeal swab, presence of viral  mutation(s) within the areas targeted by this assay, and inadequate number of viral copies(<138 copies/mL). A negative result must be combined with clinical observations, patient history, and epidemiological information. The expected result is Negative.  Fact Sheet for Patients:  bloggercourse.com  Fact Sheet for Healthcare Providers:  seriousbroker.it  This test is no t yet approved or cleared by the [place]  FDA and  has been authorized for detection and/or diagnosis of SARS-CoV-2 by FDA under an Emergency Use Authorization (EUA). This EUA will remain  in effect (meaning this test can be used) for the duration of the COVID-19 declaration under Section 564(b)(1) of the Act, 21 U.S.C.section 360bbb-3(b)(1), unless the authorization is terminated  or revoked sooner.       Influenza A by PCR NEGATIVE NEGATIVE Final   Influenza B by PCR NEGATIVE NEGATIVE Final    Comment: (NOTE) The Xpert Xpress SARS-CoV-2/FLU/RSV plus assay is intended as an aid in the diagnosis of influenza  from Nasopharyngeal swab specimens and should not be used as a sole basis for treatment. Nasal washings and aspirates are unacceptable for Xpert Xpress SARS-CoV-2/FLU/RSV testing.  Fact Sheet for Patients: bloggercourse.com  Fact Sheet for Healthcare Providers: seriousbroker.it  This test is not yet approved or cleared by the [place]  FDA and has been authorized for detection and/or diagnosis of SARS-CoV-2 by FDA under an Emergency Use Authorization (EUA). This EUA will remain in effect (meaning this test can be used) for the duration of the COVID-19 declaration under Section 564(b)(1) of the Act, 21 U.S.C. section 360bbb-3(b)(1), unless the authorization is terminated or revoked.     Resp Syncytial Virus by PCR NEGATIVE NEGATIVE Final    Comment: (NOTE) Fact Sheet for  Patients: bloggercourse.com  Fact Sheet for Healthcare Providers: seriousbroker.it  This test is not yet approved or cleared by the [place]  FDA and has been authorized for detection and/or diagnosis of SARS-CoV-2 by FDA under an Emergency Use Authorization (EUA). This EUA will remain in effect (meaning this test can be used) for the duration of the COVID-19 declaration under Section 564(b)(1) of the Act, 21 U.S.C. section 360bbb-3(b)(1), unless the authorization is terminated or revoked.  Performed at [facility], [address] [place], [place] [address]   MRSA Next Gen by PCR, Nasal     Status: Abnormal   Collection Time: 06/10/23  2:30 AM   Specimen: Nasal Mucosa; Nasal Swab  Result Value Ref Range Status   MRSA by PCR Next Gen DETECTED (A) NOT DETECTED Final    Comment: (NOTE) The GeneXpert MRSA Assay (FDA approved for NASAL specimens only), is one component of a comprehensive MRSA colonization surveillance program. It is not intended to diagnose MRSA infection nor to guide or monitor treatment for MRSA infections. Test performance is not FDA approved in patients less than 77 years old. Performed at [facility], [address], [place], [place] [address]   Urine Culture (for pregnant, neutropenic or urologic patients or patients with an indwelling urinary catheter)     Status: Abnormal   Collection Time: 06/10/23  6:00 PM   Specimen: Urine, Bag (ped)  Result Value Ref Range Status   Specimen Description   Final    URINE, BAG PED Performed at [facility], [address] [place], [place] [address]    Special Requests   Final    NONE Performed at [facility], [address], [place], [place] [address]    Culture MULTIPLE SPECIES PRESENT, SUGGEST RECOLLECTION (A)  Final   Report Status 06/12/2023 FINAL  Final    Radiology Studies: No  results found.  Scheduled Meds:  acetaminophen   1,000 mg Oral Once   Chlorhexidine  Gluconate Cloth  6 each Topical Daily   enoxaparin  (LOVENOX ) injection  40 mg Subcutaneous Q24H   feeding supplement  237 mL Oral BID BM   insulin  aspart  0-9 Units Subcutaneous Q6H   levothyroxine   25 mcg Oral Q0600   LORazepam   1 mg Intravenous Once   mirtazapine   7.5 mg Oral QHS   pantoprazole   40 mg Oral Daily   potassium chloride   20 mEq Oral Once   sodium chloride  flush  10-40 mL Intracatheter Q12H   thiamine   100 mg Oral Daily   Continuous Infusions:  sodium chloride  50 mL/hr at 06/19/23 0451   TPN (CLINIMIX -E) Adult     And   fat emul(SMOFlipid )     TPN (CLINIMIX -E) Adult 42 mL/hr  at 06/19/23 0451     LOS: 9 days    Time spent: 35 mins    Darcel Dawley, MD [facility]   If 7PM-7AM, please contact night-coverage

## 2023-06-20 ENCOUNTER — Inpatient Hospital Stay (HOSPITAL_COMMUNITY): Payer: Medicare Other

## 2023-06-20 DIAGNOSIS — E871 Hypo-osmolality and hyponatremia: Secondary | ICD-10-CM | POA: Diagnosis not present

## 2023-06-20 HISTORY — PX: IR PATIENT EVAL TECH 0-60 MINS: IMG5564

## 2023-06-20 LAB — COMPREHENSIVE METABOLIC PANEL
ALT: 328 U/L — ABNORMAL HIGH (ref 0–44)
AST: 146 U/L — ABNORMAL HIGH (ref 15–41)
Albumin: 2.7 g/dL — ABNORMAL LOW (ref 3.5–5.0)
Alkaline Phosphatase: 430 U/L — ABNORMAL HIGH (ref 38–126)
Anion gap: 8 (ref 5–15)
BUN: 63 mg/dL — ABNORMAL HIGH (ref 8–23)
CO2: 24 mmol/L (ref 22–32)
Calcium: 9.2 mg/dL (ref 8.9–10.3)
Chloride: 100 mmol/L (ref 98–111)
Creatinine, Ser: 0.92 mg/dL (ref 0.44–1.00)
GFR, Estimated: >60 mL/min (ref >60)
Glucose, Bld: 108 mg/dL — ABNORMAL HIGH (ref 70–99)
Potassium: 3.1 mmol/L — ABNORMAL LOW (ref 3.5–5.1)
Sodium: 132 mmol/L — ABNORMAL LOW (ref 135–145)
Total Bilirubin: 0.4 mg/dL (ref 0.0–1.2)
Total Protein: 5.9 g/dL — ABNORMAL LOW (ref 6.5–8.1)

## 2023-06-20 LAB — GLUCOSE, CAPILLARY
Glucose-Capillary: 102 mg/dL — ABNORMAL HIGH (ref 70–99)
Glucose-Capillary: 141 mg/dL — ABNORMAL HIGH (ref 70–99)
Glucose-Capillary: 161 mg/dL — ABNORMAL HIGH (ref 70–99)
Glucose-Capillary: 205 mg/dL — ABNORMAL HIGH (ref 70–99)
Glucose-Capillary: 98 mg/dL (ref 70–99)

## 2023-06-20 LAB — CBC
HCT: 27.6 % — ABNORMAL LOW (ref 36.0–46.0)
Hemoglobin: 9.2 g/dL — ABNORMAL LOW (ref 12.0–15.0)
MCH: 30.5 pg (ref 26.0–34.0)
MCHC: 33.3 g/dL (ref 30.0–36.0)
MCV: 91.4 fL (ref 80.0–100.0)
Platelets: 167 10*3/uL (ref 150–400)
RBC: 3.02 MIL/uL — ABNORMAL LOW (ref 3.87–5.11)
RDW: 14.9 % (ref 11.5–15.5)
WBC: 4.2 10*3/uL (ref 4.0–10.5)
nRBC: 0 % (ref 0.0–0.2)

## 2023-06-20 LAB — MAGNESIUM: Magnesium: 1.8 mg/dL (ref 1.7–2.4)

## 2023-06-20 LAB — PHOSPHORUS: Phosphorus: 3.2 mg/dL (ref 2.5–4.6)

## 2023-06-20 MED ORDER — FAT EMUL FISH OIL/PLANT BASED 20% (SMOFLIPID)IV EMUL: 240.0000 mL | INTRAVENOUS | Status: DC

## 2023-06-20 MED ORDER — TRACE MINERALS CU-MN-SE-ZN 300-55-60-3000 MCG/ML IV SOLN: INTRAVENOUS | Status: DC

## 2023-06-20 MED ORDER — TRAVASOL 10 % IV SOLN
INTRAVENOUS | Status: AC
  Filled 2023-06-20: qty 858

## 2023-06-20 MED ORDER — ENSURE ENLIVE PO LIQD
237.0000 mL | Freq: Three times a day (TID) | ORAL | Status: DC
  Administered 2023-06-20 – 2023-06-24 (×10): 237 mL via ORAL

## 2023-06-20 MED ORDER — POTASSIUM CHLORIDE CRYS ER 20 MEQ PO TBCR
40.0000 meq | EXTENDED_RELEASE_TABLET | ORAL | Status: AC
  Administered 2023-06-20 (×2): 40 meq via ORAL
  Filled 2023-06-20 (×2): qty 2

## 2023-06-20 MED ORDER — FLUCONAZOLE 150 MG PO TABS
150.0000 mg | ORAL_TABLET | Freq: Once | ORAL | Status: AC
  Administered 2023-06-20: 150 mg via ORAL
  Filled 2023-06-20: qty 1

## 2023-06-20 NOTE — Plan of Care (Signed)
  Problem: Education: Goal: Knowledge of General Education information will improve Description: Including pain rating scale, medication(s)/side effects and non-pharmacologic comfort measures Outcome: Progressing   Problem: Activity: Goal: Risk for activity intolerance will decrease Outcome: Progressing   Problem: Nutrition: Goal: Adequate nutrition will be maintained Outcome: Progressing   Problem: Coping: Goal: Level of anxiety will decrease Outcome: Progressing   Problem: Elimination: Goal: Will not experience complications related to bowel motility Outcome: Progressing Goal: Will not experience complications related to urinary retention Outcome: Progressing   Problem: Pain Management: Goal: General experience of comfort will improve Outcome: Progressing   Problem: Safety: Goal: Ability to remain free from injury will improve Outcome: Progressing   Problem: Skin Integrity: Goal: Risk for impaired skin integrity will decrease Outcome: Progressing

## 2023-06-20 NOTE — TOC Progression Note (Signed)
 Transition of Care [facility]) - Progression Note    Patient Details  Name: [name] MRN: [id number] Date of Birth: 1955-10-30  [facility] CM/SW Contact  Tawni [name], LCSW Phone Number: 06/20/2023, 9:29 AM  Clinical Narrative:    Pt's shara is pending for SNF placement.Toc to follow.     Expected Discharge Plan: Skilled Nursing Facility Barriers to Discharge: Continued Medical Work up  Expected Discharge Plan and Services       Living arrangements for the past 2 months: Single Family Home                                       Social Determinants of Health (SDOH) Interventions SDOH Screenings   Food Insecurity: No Food Insecurity (06/10/2023)  Housing: Low Risk  (06/10/2023)  Transportation Needs: No Transportation Needs (06/10/2023)  Utilities: Not At Risk (06/10/2023)  Depression (PHQ2-9): Low Risk  (12/06/2022)  Financial Resource Strain: Low Risk  (11/06/2022)  Social Connections: Patient Unable To Answer (06/18/2023)  Stress: No Stress Concern Present (11/06/2022)  Tobacco Use: Medium Risk (06/09/2023)    Readmission Risk Interventions    11/21/2022   10:10 AM 11/18/2022    2:11 PM  Readmission Risk Prevention Plan  Post Dischage Appt  Complete  Medication Screening  Complete  Transportation Screening Complete Complete  HRI or Home Care Consult Complete   Social Work Consult for Recovery Care Planning/Counseling Complete   Palliative Care Screening Not Applicable   Medication Review Oceanographer) Complete

## 2023-06-20 NOTE — Progress Notes (Addendum)
 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   Assessment:  68 y/o F with PMH of cervical cancer and rectovaginal fistula s/p redo low anterior resection with colovaginal fistula takedown, fistula repair, and hand sewn end to end sigmoidrectal anastomosis with loop ileostomy on 11/23/18; closure of loop ileostomy 05/01/20, SBO s/p ex-lap with lysis of adhesions on 11/08/20, recurrent SBO s/p ex-lap, LOA, distal ileum and cecum resection with end ileostomy creation on 03/14/2023 started on TPN, right hydronephrosis s/p R ureteral stent placement on 04/02/2023 with subsequent obstructive uropathy, UTI, AKI and bilateral hydronephrosis with placement of right nephrostomy tube on 05/08/2023 who presents with nausea/vomiting and hyponatremia. Pharmacy consulted to resume TPN on admission.   Glucose / Insulin :  No history of DM.  - on sSSI (used 6 units in the past 24 hrs) - CBGs last 24hr (goal <180): 98-174 (no  reading >180) Electrolytes: Na 132, K+ dropped to 3.1,   - other lytes wnl including CorrCa Renal: scr now <1, BUN up to 63 Hepatic: note hx transaminitis in PMH  - AST/ALT slightly up to  146-328 (Depakote  d/ced om 12/28). GI team now suspects elevated LFTs is from course of ceftriaxone   - Alk phos elevated but trending down at 430 - Tbili wnl  - TG trending down 57 (12/30) - Albumin  low 2.8 Intake / Output; MIVF: -659 mL - MIVF: NS @ 50 mL/hr - UOP: 1750 mL charted - Stool output: 1875 mL GI Imaging:  - 12/23 CT abdomen and pelvis: did not show SBO but showed moderate to marked severity right-sided hydronephrosis and proximal hydroureter with percutaneous right-sided nephrostomy tube and right sided intraureteral stent in  place along with mild to moderate severity left-sided hydronephrosis and proximal hydroureter with markedly dilated intrarenal calyces on the left.  GI Surgeries / Procedures: see above  Central access: PICC line in place TPN start date: continued from PTA  - 12/29: per MD's request, TPN stopped at 11a due to elevated LFTs.  - 12/30: Per Dr. Kriss, continue to hold TPN.  - 12/31: Per Dr. Kriss, resume TPN back today   Nutritional Goals: Due to the Baxter IV fluid disruption, all adult parenteral nutrition will utilize premade Clinimix  products +/- fat emulsion infusion. Our goal will be to continue providing as close to 100% of our patient's nutritional needs, but due to limited TPN Clinimix  concentrations we will meet at least 80% of protein and 75% of kcal goals.  Per discussion with [employer] and [employer], patient was receiving Clinimix  E 5/15 1000 mL over 24 hours and Intralipid 20% 250 mL over 12 hours while at facility   -New Goal TPN rate (Clinimix  8/10) is 50  mL/hr (provides 96 g of protein and 1272  kcals per day) - meets 100% of protein goal and 78% of kcal goals with TPN alone - these are updated amounts based on the available TPN   -Also, each Ensure Plus supplement (ordered BID) provides 350 kcal and 20g of protein     RD Assessment: Estimated Needs Total Energy Estimated Needs: 1500-1700 Total Protein Estimated Needs: 75-90g Total Fluid Estimated Needs: 1.7L/day  Current Nutrition:  Regular diet Ensure Plus BID TPN to resume this PM  Plan:  -  MD ordered 40 meq PO x 2 of KCl  At 1800: - continue TPN at 65  mL/hr at 1800 - Electrolytes in TPN (fixed; unable to adjust in premixed Clinimix ): Na 50 mEq/L, K 50 mEq/L, Ca 4mEq/L, Mg 14mEq/L, Phos 15mmol/L, Acetate 74 mEq/L, Cl 74 mEq/L - Add standard MVI and trace elements to TPN - SMOFlipid  20% 250mL IV over 12 hours.  - Continue  Sensitive q6h SSI for now - Thiamine  100mg  PO daily per RD  recommendations - MIVF management per MD - BMP, magnesium , phosphorus with AM labs - Monitor TPN labs on Mon/Thurs and prn. CMET, phos and Mag on 06/18/23    Miamor Ayler, PharmD, BCPS 06/20/2023 11:35 AM

## 2023-06-20 NOTE — Progress Notes (Addendum)
 PROGRESS NOTE    [name]  FMW:[id number] DOB: 05/18/1956 DOA: 06/09/2023 PCP: Patient, [name]   Brief Narrative:  68 y.o. female with medical history significant of cervical cancer and rectovaginal fistula s/p redo low anterior resection with colovaginal fistula takedown, fistula repair, and hand sewn end to end sigmoidrectal anastomosis with loop ileostomy on 11/23/18; closure of loop ileostomy 05/01/20, SBO s/p ex-lap with lysis of adhesions on 11/08/20, recurrent SBO s/p ex-lap, LOA, distal ileum and cecum resection with end ileostomy creation on 03/14/2023 started on TPN, right hydronephrosis s/p R ureteral stent placement on 04/02/2023 with subsequent obstructive uropathy, UTI, AKI and bilateral hydronephrosis with placement of right nephrostomy tube on 05/08/2023 presented with nausea, vomiting and hyponatremia from SNF.  Patient has been on TPN recently after her recent hospitalization at [facility] and discharged to SNF.  On presentation, she was tachypneic with sodium of 121 (last sodium on 05/24/2023 in care everywhere was 130).  Creatinine was 2.07 (last creatinine was 1.09 on 05/24/2023) with bicarb of 18.  CT of abdomen and pelvis did not show SBO but showed moderate to marked severity right-sided hydronephrosis and proximal hydroureter with percutaneous right-sided nephrostomy tube and right sided intraureteral stent in place along with mild to moderate severity left-sided hydronephrosis and proximal hydroureter with markedly dilated intrarenal calyces on the left.  She was started on IV fluids.  TPN was resumed.  She was started on IV antibiotics for possible UTI.  Urology was also consulted and recommended conservative management and outpatient follow-up.  PT recommending SNF placement.  TOC following.  During the hospitalization, she had elevated LFTs: GI was consulted, TPN held for few days but subsequently been resumed with slight improvement in LFTs.  GI has  signed off.  Assessment & Plan:   Hyponatremia -Possibly due to decreased oral intake and likely GI loss. -Sodium 121 on presentation.  Sodium 132 this morning.  DC gentle hydration.  Encourage oral intake.  Off sertraline . -Repeat a.m. labs  Acute kidney injury on chronic kidney disease stage IIIa Acute metabolic acidosis Possible UTI: Present on admission History of bilateral hydronephrosis status post ureteral stent and right nephrostomy tube -Urology evaluation by Dr. Gaston on 06/13/2023 appreciated: Recommended to continue nephrostomy tube for now and outpatient follow-up with regular urologist in [facility]. -UA suggestive of possible UTI.  Treated with 5 days of Rocephin  and subsequently discontinued.  Urine culture grew multiple species.  Hypokalemia -Replace.  Repeat a.m. labs  Hypothyroidism -Continue levothyroxine   Hyperlipidemia -Hold statin due to transaminitis  Transaminitis -CT imaging showed no biliary dilatation.  GI was consulted, TPN held for few days but subsequently been resumed with slight improvement in LFTs.  GI has signed off.  Outpatient follow-up with GI.  LFTs still elevated.  Monitor.  Nausea and vomiting -Has been an ongoing chronic issue for her due to her complex abdominal surgical history.  No small bowel obstruction demonstrated on CT imaging on presentation.  Continue as needed antiemetics.  Currently improving.  Outpatient follow-up with primary surgical team  Failure to thrive in adult --Pt has extension abdominal surgery due to hx of cervical cancer and currently on TPN  -Palliative care evaluation appreciated.  Patient remains full code. -Patient requesting for TPN to be discontinued.  I have ordered calorie count.  Encourage oral intake.  Will need outpatient follow-up with her general surgeon  History of cervical cancer and rectovaginal fistula status post surgical intervention -Currently has an ileostomy.  Outpatient follow-up with  oncology/GYN oncology/general surgery  Anxiety/depression -Continue mirtazapine  along with as needed Xanax .  Sertraline  discontinued  Physical deconditioning -Patient from SNF.  PT recommending SNF placement.  TOC following  DVT prophylaxis: Lovenox  Code Status: Full Family Communication: Husband at bedside  disposition Plan: Status is: inpatient because: Of severity of illness.  Need for SNF placement  Consultants: Urology.  Palliative care.  GI Procedures: None  Antimicrobials:  Anti-infectives (From admission, onward)    Start     Dose/Rate Route Frequency Ordered Stop   06/20/23 1045  fluconazole  (DIFLUCAN ) tablet 150 mg        150 mg Oral  Once 06/20/23 0958     06/11/23 1200  cefTRIAXone  (ROCEPHIN ) 1 g in sodium chloride  0.9 % 100 mL IVPB        1 g 200 mL/hr over 30 Minutes Intravenous Every 24 hours 06/11/23 1154 06/15/23 1316         Subjective: Patient seen and examined at bedside.  States that she is eating better and requesting her TPN to be discontinued.  Complains of some vaginal itching and feels that she is having a yeast infection.  No fever, vomiting reported. Objective: Vitals:   06/19/23 2058 06/19/23 2059 06/19/23 2321 06/20/23 0356  BP: 94/75  (!) 118/93 (!) 94/59  Pulse: (!) 102 98 78 85  Resp:    18  Temp: 98 F (36.7 C)  (!) 97.4 F (36.3 C) 97.7 F (36.5 C)  TempSrc: Oral  Tympanic   SpO2: 100%   100%  Weight:    45.4 kg  Height:        Intake/Output Summary (Last 24 hours) at 06/20/2023 1043 Last data filed at 06/20/2023 0948 Gross per 24 hour  Intake 3295.77 ml  Output 3000 ml  Net 295.77 ml   Filed Weights   06/16/23 0500 06/18/23 0500 06/20/23 0356  Weight: 46.6 kg 46.2 kg 45.4 kg    Examination:  General: No acute distress.  Currently on room air.  Chronically ill and deconditioned looking. ENT/neck: No obvious thyromegaly or elevated JVD noted  respiratory: Decreased breath sounds at bases bilaterally with scattered  crackles CVS: Rate mostly controlled; S1 and S2 are heard abdominal: Soft, nontender but still slightly distended; no organomegaly, bowel sounds are heard.  Ileostomy bag present with liquid stool. Genitourinary: Right-sided nephrostomy tube is present  extremities: No clubbing; mild lower extremity edema present CNS: Awake, answers some questions.  No obvious focal deficit noted.   Lymph: No palpable lymphadenopathy  skin: No obvious petechia/lesions psych: Flat affect.  Not agitated. Musculoskeletal: No obvious joint deformity/erythema  data Reviewed: I have personally reviewed following labs and imaging studies  CBC: Recent Labs  Lab 06/16/23 0308 06/17/23 0255 06/18/23 0432 06/19/23 0419 06/20/23 0412  WBC 4.6 4.5 4.4 4.7 4.2  NEUTROABS 2.5  --   --   --   --   HGB 10.4* 10.7* 9.9* 10.0* 9.2*  HCT 30.6* 33.9* 30.1* 30.7* 27.6*  MCV 90.3 91.4 91.8 91.9 91.4  PLT 190 203 177 216 167   Basic Metabolic Panel: Recent Labs  Lab 06/16/23 0308 06/17/23 0255 06/18/23 0432 06/19/23 0419 06/20/23 0412  NA 131* 128* 131* 130* 132*  K 3.3* 3.1* 3.9 3.5 3.1*  CL 91* 88* 93* 95* 100  CO2 31 28 29 25 24   GLUCOSE 93 98 149* 102* 108*  BUN 45* 40* 47* 56* 63*  CREATININE 0.97 1.21* 1.24* 1.23* 0.92  CALCIUM  8.9 8.7* 8.9  9.2 9.2  MG 2.1 1.9 1.9 2.0 1.8  PHOS 3.3 3.3 3.6 3.7 3.2   GFR: Estimated Creatinine Clearance: 42.5 mL/min (by C-G formula based on SCr of 0.92 mg/dL). Liver Function Tests: Recent Labs  Lab 06/16/23 0308 06/17/23 0255 06/18/23 0432 06/19/23 0419 06/20/23 0412  AST 167* 223* 121* 116* 146*  ALT 399* 469* 343* 288* 328*  ALKPHOS 434* 518* 481* 441* 430*  BILITOT 0.5 0.7 0.3 0.4 0.4  PROT 6.5 6.8 6.3* 6.2* 5.9*  ALBUMIN  2.8* 2.9* 2.8* 2.8* 2.7*   No results for input(s): LIPASE, AMYLASE in the last 168 hours.  No results for input(s): AMMONIA in the last 168 hours. Coagulation Profile: No results for input(s): INR, PROTIME in the last 168  hours. Cardiac Enzymes: No results for input(s): CKTOTAL, CKMB, CKMBINDEX, TROPONINI in the last 168 hours. BNP (last 3 results) No results for input(s): PROBNP in the last 8760 hours. HbA1C: No results for input(s): HGBA1C in the last 72 hours. CBG: Recent Labs  Lab 06/19/23 1158 06/19/23 1724 06/20/23 0022 06/20/23 0607 06/20/23 0608  GLUCAP 174* 158* 161* 102* 98   Lipid Profile: No results for input(s): CHOL, HDL, LDLCALC, TRIG, CHOLHDL, LDLDIRECT in the last 72 hours.  Thyroid  Function Tests: No results for input(s): TSH, T4TOTAL, FREET4, T3FREE, THYROIDAB in the last 72 hours.  Anemia Panel: No results for input(s): VITAMINB12, FOLATE, FERRITIN, TIBC, IRON, RETICCTPCT in the last 72 hours.  Sepsis Labs: No results for input(s): PROCALCITON, LATICACIDVEN in the last 168 hours.  Recent Results (from the past 240 hours)  Urine Culture (for pregnant, neutropenic or urologic patients or patients with an indwelling urinary catheter)     Status: Abnormal   Collection Time: 06/10/23  6:00 PM   Specimen: Urine, Bag (ped)  Result Value Ref Range Status   Specimen Description   Final    URINE, BAG PED Performed at [facility], [address], [place], [place] [address]    Special Requests   Final    NONE Performed at [facility], [address] [place], [place] [address]    Culture MULTIPLE SPECIES PRESENT, SUGGEST RECOLLECTION (A)  Final   Report Status 06/12/2023 FINAL  Final         Radiology Studies: No results found.       Scheduled Meds:  acetaminophen   1,000 mg Oral Once   Chlorhexidine  Gluconate Cloth  6 each Topical Daily   enoxaparin  (LOVENOX ) injection  40 mg Subcutaneous Q24H   feeding supplement  237 mL Oral BID BM   fluconazole   150 mg Oral Once   insulin  aspart  0-9 Units Subcutaneous Q6H   levothyroxine   25 mcg Oral Q0600   LORazepam   1 mg Intravenous  Once   mirtazapine   7.5 mg Oral QHS   pantoprazole   40 mg Oral Daily   potassium chloride   20 mEq Oral Once   potassium chloride   40 mEq Oral Q4H   sodium chloride  flush  10-40 mL Intracatheter Q12H   thiamine   100 mg Oral Daily   Continuous Infusions:  TPN (CLINIMIX -E) Adult 42 mL/hr at 06/19/23 1739          Sophie Mao, MD [facility] 06/20/2023, 10:43 AM

## 2023-06-20 NOTE — Progress Notes (Addendum)
 Nutrition Follow-up  DOCUMENTATION CODES:      INTERVENTION:  - 48 hour calorie count to start today (1/3) at lunch and run through 1/5 after dinner  - Please document % intake of all foods, drinks, and nutrition supplements patient consumes on meal tickets and place in envelope on patient's door.  - If patient skips/refuses meals please document 0% for that meal.  - Discussed with RN.   - Regular diet.  - Continue Ensure Plus High Protein po TID, each supplement provides 350 kcal and 20 grams of protein as tolerated   -  Plan to continue TPN.  -TPN management per Pharmacy  - Monitor weight trends.   NUTRITION DIAGNOSIS:   Inadequate oral intake related to nausea, vomiting, altered GI function as evidenced by per patient/family report. *ongoing  GOAL:   Patient will meet greater than or equal to 90% of their needs *progressing  MONITOR:   PO intake, Supplement acceptance, Labs, Weight trends, I & O's, Skin (TPN)  REASON FOR ASSESSMENT:   Consult Calorie Count  ASSESSMENT:   68 y.o. female with medical history significant of cervical cancer and rectovaginal fistula s/p redo low anterior resection with colovaginal fistula takedown, fistula repair, and hand sewn end to end sigmoidrectal anastomosis with loop ileostomy on 11/23/18; closure of loop ileostomy 05/01/20, SBO s/p ex-lap with lysis of adhesions on 11/08/20, recurrent SBO s/p ex-lap, LOA, distal ileum and cecum resection with end ileostomy creation on 03/14/2023 started on TPN, right hydronephrosis s/p R ureteral stent placement on 04/02/2023 with subsequent obstructive uropathy, UTI, AKI and bilateral hydronephrosis with placement of right nephrostomy tube on 05/08/2023 who presents with nausea and vomiting and hyponatremia.  12/23: Admit 12/24: TPN initiated 12/29: TPN held d/t elevated LFT's 12/31: TPN restarted 1/3: Calorie count started  Calorie count ordered by MD today as patient feels she is eating well and  wanting TPN to be discontinued.  Upon visit to room patient reports she is eating very well. She is documented to be consuming 15-95% of meals with average of 47%. Explained plan for calorie count to fully assess oral intake and encouraged patient to continue to eat well and consume supplements. Patient continues to be verbally aggressive. Patient is noted to have extensive abdominal surgery so question if she needs TPN even if eating well however per chart review she was started on TPN due to intractable N/V and this has now resolved. Will have calorie count results on Monday, 1/6.   Admit weight: 108# Current weight: 100#  I&O's: -7.6L  Medications reviewed and include: Remeron , 100mg  thiamine   Labs reviewed: Na 132 K+ 3.1  Diet Order:   Diet Order             Diet regular Room service appropriate? Yes; Fluid consistency: Thin  Diet effective now                   EDUCATION NEEDS:  No education needs have been identified at this time  Skin:  Skin Assessment: Skin Integrity Issues: Skin Integrity Issues:: Stage II Stage II: mid sacrum  Last BM:  1/3 - ileostomy  Height:  Ht Readings from Last 1 Encounters:  06/10/23 5' 2 (1.575 m)   Weight:  Wt Readings from Last 1 Encounters:  06/20/23 45.4 kg    BMI:  Body mass index is 18.31 kg/m.  Estimated Nutritional Needs:  Kcal:  1500-1700 Protein:  75-90g Fluid:  1.7L/day    Trude Ned RD, LDN Contact via Secure  Chat.

## 2023-06-20 NOTE — Progress Notes (Signed)
 Presented to patient's room, to assess right nephrostomy tube, after removal of dressing, suture noticeably pulled back, bag has been draining appropriately all day, IR team attached a Stat Lock Stabilizing device.  Site was redressed, patient instructed to inform radiology if tube stops draining or excessive leaking around site.

## 2023-06-20 NOTE — Progress Notes (Signed)
 Patient ID: [name], female   DOB: 1956-01-13, 68 y.o.   MRN: [id number] Asked to evaluate patient's right nephrostomy which was placed at outside facility due to stitch no longer in place; on exam nephrostomy catheter is intact, stitch is not in skin at this time.  Catheter was flushed with normal saline without difficulty.  Yellow urine draining into bag without difficulty at this time.  Prior imaging from 12/23 revealed appropriate placement of nephrostomy.  A StatLock device was then attached to secure catheter in place.  No further intervention required at this time.  If output starts to diminish or patient has increasing pain at site can perform nephrostogram.

## 2023-06-21 DIAGNOSIS — E871 Hypo-osmolality and hyponatremia: Secondary | ICD-10-CM | POA: Diagnosis not present

## 2023-06-21 LAB — BASIC METABOLIC PANEL
Anion gap: 8 (ref 5–15)
BUN: 67 mg/dL — ABNORMAL HIGH (ref 8–23)
CO2: 28 mmol/L (ref 22–32)
Calcium: 9.7 mg/dL (ref 8.9–10.3)
Chloride: 97 mmol/L — ABNORMAL LOW (ref 98–111)
Creatinine, Ser: 0.95 mg/dL (ref 0.44–1.00)
GFR, Estimated: >60 mL/min (ref >60)
Glucose, Bld: 133 mg/dL — ABNORMAL HIGH (ref 70–99)
Potassium: 4 mmol/L (ref 3.5–5.1)
Sodium: 133 mmol/L — ABNORMAL LOW (ref 135–145)

## 2023-06-21 LAB — GLUCOSE, CAPILLARY
Glucose-Capillary: 144 mg/dL — ABNORMAL HIGH (ref 70–99)
Glucose-Capillary: 211 mg/dL — ABNORMAL HIGH (ref 70–99)
Glucose-Capillary: 212 mg/dL — ABNORMAL HIGH (ref 70–99)
Glucose-Capillary: 234 mg/dL — ABNORMAL HIGH (ref 70–99)

## 2023-06-21 LAB — PHOSPHORUS: Phosphorus: 3.9 mg/dL (ref 2.5–4.6)

## 2023-06-21 LAB — MAGNESIUM: Magnesium: 2.1 mg/dL (ref 1.7–2.4)

## 2023-06-21 MED ORDER — LIDOCAINE 5 % EX PTCH
1.0000 | MEDICATED_PATCH | CUTANEOUS | Status: AC
  Administered 2023-06-22 – 2023-06-23 (×2): 1 via TRANSDERMAL
  Filled 2023-06-21 (×2): qty 1

## 2023-06-21 MED ORDER — TRAVASOL 10 % IV SOLN
INTRAVENOUS | Status: AC
  Filled 2023-06-21: qty 806.4

## 2023-06-21 NOTE — Progress Notes (Signed)
 PROGRESS NOTE    GIZELL DANSER  FMW:[id number] DOB: 1956-01-25 DOA: 06/09/2023 PCP: Patient, [name]   Brief Narrative:  68 y.o. female with medical history significant of cervical cancer and rectovaginal fistula s/p redo low anterior resection with colovaginal fistula takedown, fistula repair, and hand sewn end to end sigmoidrectal anastomosis with loop ileostomy on 11/23/18; closure of loop ileostomy 05/01/20, SBO s/p ex-lap with lysis of adhesions on 11/08/20, recurrent SBO s/p ex-lap, LOA, distal ileum and cecum resection with end ileostomy creation on 03/14/2023 started on TPN, right hydronephrosis s/p R ureteral stent placement on 04/02/2023 with subsequent obstructive uropathy, UTI, AKI and bilateral hydronephrosis with placement of right nephrostomy tube on 05/08/2023 presented with nausea, vomiting and hyponatremia from SNF.  Patient has been on TPN recently after her recent hospitalization at [facility] and discharged to SNF.  On presentation, she was tachypneic with sodium of 121 (last sodium on 05/24/2023 in care everywhere was 130).  Creatinine was 2.07 (last creatinine was 1.09 on 05/24/2023) with bicarb of 18.  CT of abdomen and pelvis did not show SBO but showed moderate to marked severity right-sided hydronephrosis and proximal hydroureter with percutaneous right-sided nephrostomy tube and right sided intraureteral stent in place along with mild to moderate severity left-sided hydronephrosis and proximal hydroureter with markedly dilated intrarenal calyces on the left.  She was started on IV fluids.  TPN was resumed.  She was started on IV antibiotics for possible UTI.  Urology was also consulted and recommended conservative management and outpatient follow-up.  PT recommending SNF placement.  TOC following.  During the hospitalization, she had elevated LFTs: GI was consulted, TPN held for few days but subsequently been resumed with slight improvement in LFTs.  GI has  signed off.  Assessment & Plan:   Hyponatremia -Possibly due to decreased oral intake and likely GI loss. -Sodium 121 on presentation.  Sodium 133 this morning.  Off gentle hydration.  Continue to encourage oral intake.  Off sertraline . -Repeat a.m. labs  Acute kidney injury on chronic kidney disease stage IIIa Acute metabolic acidosis Possible UTI: Present on admission History of bilateral hydronephrosis status post ureteral stent and right nephrostomy tube -Urology evaluation by Dr. Gaston on 06/13/2023 appreciated: Recommended to continue nephrostomy tube for now and outpatient follow-up with regular urologist in [facility]. -UA suggestive of possible UTI.  Treated with 5 days of Rocephin  and subsequently discontinued.  Urine culture grew multiple species.  Hypokalemia -Improved  Hypothyroidism -Continue levothyroxine   Hyperlipidemia -Hold statin due to transaminitis  Transaminitis -CT imaging showed no biliary dilatation.  GI was consulted, TPN held for few days but subsequently been resumed with slight improvement in LFTs.  GI has signed off.  Outpatient follow-up with GI.  LFTs still elevated.  Monitor intermittently.  Nausea and vomiting -Has been an ongoing chronic issue for her due to her complex abdominal surgical history.  No small bowel obstruction demonstrated on CT imaging on presentation.  Continue as needed antiemetics.  Currently improving.  Outpatient follow-up with primary surgical team  Failure to thrive in adult --Pt has extension abdominal surgery due to hx of cervical cancer and currently on TPN  -Palliative care evaluation appreciated.  Patient remains full code. -Patient requesting for TPN to be discontinued.  48-hour calorie count ordered on 06/20/2023.  Follow dietitian's recommendations.  Encourage oral intake.  Will need outpatient follow-up with her general surgeon  History of cervical cancer and rectovaginal fistula status post surgical  intervention -  Currently has an ileostomy.  Outpatient follow-up with oncology/GYN oncology/general surgery  Anxiety/depression -Continue mirtazapine  along with as needed Xanax .  Sertraline  discontinued  Physical deconditioning -Patient from SNF.  PT recommending SNF placement.  TOC following  DVT prophylaxis: Lovenox  Code Status: Full Family Communication: Husband at bedside on 06/20/2023 disposition Plan: Status is: inpatient because: Of severity of illness.  Need for SNF placement  Consultants: Urology.  Palliative care.  GI Procedures: None  Antimicrobials:  Anti-infectives (From admission, onward)    Start     Dose/Rate Route Frequency Ordered Stop   06/20/23 1045  fluconazole  (DIFLUCAN ) tablet 150 mg        150 mg Oral  Once 06/20/23 0958 06/20/23 1112   06/11/23 1200  cefTRIAXone  (ROCEPHIN ) 1 g in sodium chloride  0.9 % 100 mL IVPB        1 g 200 mL/hr over 30 Minutes Intravenous Every 24 hours 06/11/23 1154 06/15/23 1316         Subjective: Patient seen and examined at bedside.  Denies worsening abdominal, fever or vomiting.    Objective: Vitals:   06/20/23 1959 06/21/23 0500 06/21/23 0515 06/21/23 0524  BP: 99/69  (!) 79/62 95/70  Pulse: 85  99 97  Resp:    19  Temp: 98.9 F (37.2 C)  (!) 97.4 F (36.3 C)   TempSrc: Oral  Oral   SpO2: 100%  100%   Weight:  46 kg    Height:        Intake/Output Summary (Last 24 hours) at 06/21/2023 0822 Last data filed at 06/21/2023 0557 Gross per 24 hour  Intake 2504.51 ml  Output 4400 ml  Net -1895.49 ml   Filed Weights   06/18/23 0500 06/20/23 0356 06/21/23 0500  Weight: 46.2 kg 45.4 kg 46 kg    Examination:  General: On room air currently.  No distress.  Chronically ill and deconditioned looking. ENT/neck: No obvious JVD elevation or palpable neck masses noted  respiratory: Bilateral decreased breath sounds at bases with scattered crackles CVS: S1 and S2 heard; currently rate controlled  abdominal: Soft, mildly  distended but nontender no organomegaly, bowel sounds are heard normally.  Ileostomy bag is present  genitourinary: Has right-sided nephrostomy tube  extremities: Trace lower extremity edema present; no cyanosis  CNS: Alert.  Slow to respond.  No focal deficits noted lymph: No obvious lymphadenopathy  skin: No obvious rashes/ecchymosis  psych: Showing no signs of agitation.  Affect is flat currently. Doesn't want to talk much Musculoskeletal: No obvious joint tenderness/deformity  data Reviewed: I have personally reviewed following labs and imaging studies  CBC: Recent Labs  Lab 06/16/23 0308 06/17/23 0255 06/18/23 0432 06/19/23 0419 06/20/23 0412  WBC 4.6 4.5 4.4 4.7 4.2  NEUTROABS 2.5  --   --   --   --   HGB 10.4* 10.7* 9.9* 10.0* 9.2*  HCT 30.6* 33.9* 30.1* 30.7* 27.6*  MCV 90.3 91.4 91.8 91.9 91.4  PLT 190 203 177 216 167   Basic Metabolic Panel: Recent Labs  Lab 06/17/23 0255 06/18/23 0432 06/19/23 0419 06/20/23 0412 06/21/23 0456  NA 128* 131* 130* 132* 133*  K 3.1* 3.9 3.5 3.1* 4.0  CL 88* 93* 95* 100 97*  CO2 28 29 25 24 28   GLUCOSE 98 149* 102* 108* 133*  BUN 40* 47* 56* 63* 67*  CREATININE 1.21* 1.24* 1.23* 0.92 0.95  CALCIUM  8.7* 8.9 9.2 9.2 9.7  MG 1.9 1.9 2.0 1.8 2.1  PHOS 3.3 3.6 3.7 3.2  3.9   GFR: Estimated Creatinine Clearance: 41.7 mL/min (by C-G formula based on SCr of 0.95 mg/dL). Liver Function Tests: Recent Labs  Lab 06/16/23 0308 06/17/23 0255 06/18/23 0432 06/19/23 0419 06/20/23 0412  AST 167* 223* 121* 116* 146*  ALT 399* 469* 343* 288* 328*  ALKPHOS 434* 518* 481* 441* 430*  BILITOT 0.5 0.7 0.3 0.4 0.4  PROT 6.5 6.8 6.3* 6.2* 5.9*  ALBUMIN  2.8* 2.9* 2.8* 2.8* 2.7*   No results for input(s): LIPASE, AMYLASE in the last 168 hours.  No results for input(s): AMMONIA in the last 168 hours. Coagulation Profile: No results for input(s): INR, PROTIME in the last 168 hours. Cardiac Enzymes: No results for input(s):  CKTOTAL, CKMB, CKMBINDEX, TROPONINI in the last 168 hours. BNP (last 3 results) No results for input(s): PROBNP in the last 8760 hours. HbA1C: No results for input(s): HGBA1C in the last 72 hours. CBG: Recent Labs  Lab 06/20/23 0608 06/20/23 1139 06/20/23 1716 06/21/23 0000 06/21/23 0516  GLUCAP 98 205* 141* 234* 144*   Lipid Profile: No results for input(s): CHOL, HDL, LDLCALC, TRIG, CHOLHDL, LDLDIRECT in the last 72 hours.  Thyroid  Function Tests: No results for input(s): TSH, T4TOTAL, FREET4, T3FREE, THYROIDAB in the last 72 hours.  Anemia Panel: No results for input(s): VITAMINB12, FOLATE, FERRITIN, TIBC, IRON, RETICCTPCT in the last 72 hours.  Sepsis Labs: No results for input(s): PROCALCITON, LATICACIDVEN in the last 168 hours.  No results found for this or any previous visit (from the past 240 hours).        Radiology Studies: No results found.       Scheduled Meds:  acetaminophen   1,000 mg Oral Once   Chlorhexidine  Gluconate Cloth  6 each Topical Daily   enoxaparin  (LOVENOX ) injection  40 mg Subcutaneous Q24H   feeding supplement  237 mL Oral TID BM   insulin  aspart  0-9 Units Subcutaneous Q6H   levothyroxine   25 mcg Oral Q0600   LORazepam   1 mg Intravenous Once   mirtazapine   7.5 mg Oral QHS   pantoprazole   40 mg Oral Daily   potassium chloride   20 mEq Oral Once   sodium chloride  flush  10-40 mL Intracatheter Q12H   thiamine   100 mg Oral Daily   Continuous Infusions:  TPN ADULT (ION) 65 mL/hr at 06/20/23 1732          Sophie Mao, MD [facility] 06/21/2023, 8:22 AM

## 2023-06-21 NOTE — Progress Notes (Signed)
 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   Assessment:  68 y/o F with PMH of cervical cancer and rectovaginal fistula s/p redo low anterior resection with colovaginal fistula takedown, fistula repair, and hand sewn end to end sigmoidrectal anastomosis with loop ileostomy on 11/23/18; closure of loop ileostomy 05/01/20, SBO s/p ex-lap with lysis of adhesions on 11/08/20, recurrent SBO s/p ex-lap, LOA, distal ileum and cecum resection with end ileostomy creation on 03/14/2023 started on TPN, right hydronephrosis s/p R ureteral stent placement on 04/02/2023 with subsequent obstructive uropathy, UTI, AKI and bilateral hydronephrosis with placement of right nephrostomy tube on 05/08/2023 who presents with nausea/vomiting and hyponatremia. Pharmacy consulted to resume TPN on admission.   Glucose / Insulin :  No history of DM.  - on sSSI (used 8 units in the past 24 hrs) - CBGs last 24hr (goal <180): 98-234 (most readings are <180) Electrolytes: Na low but up to 133, CL slightly low 97, CorrCa slightly elevated at 10.74 - other lytes wnl  Renal: scr now <1, BUN up to 67 Hepatic: note hx transaminitis in PMH  - AST/ALT slightly up to  146/328 with labs on 1/3 (Depakote  d/ced om 12/28). GI team now suspects elevated LFTs is from course of ceftriaxone   - Alk phos elevated but trending down at 430 with lab on 1/3 - Tbili wnl  - TG trending down 57 (12/30) - Albumin  low 2.7 Intake / Output; MIVF: -1895 mL - MIVF: d/ced on 1/3 - UOP: 0.6 ml/kg/hr - Stool output: 3725 mL GI Imaging:  - 12/23 CT abdomen and pelvis: did not show SBO but showed moderate to marked severity right-sided hydronephrosis and proximal hydroureter with percutaneous  right-sided nephrostomy tube and right sided intraureteral stent in place along with mild to moderate severity left-sided hydronephrosis and proximal hydroureter with markedly dilated intrarenal calyces on the left.  GI Surgeries / Procedures: see above  Central access: PICC line in place TPN start date: continued from PTA  - 12/29: per MD's request, TPN stopped at 11a due to elevated LFTs.  - 12/30: Per Dr. Kriss, continue to hold TPN.  - 12/31: Per Dr. Kriss, resume TPN back today  - 1/3: calorie count  started  (result anticipated on 1/6)  Nutritional Goals: Due to the Baxter IV fluid disruption, all adult parenteral nutrition will utilize premade Clinimix  products +/- fat emulsion infusion. Our goal will be to continue providing as close to 100% of our patient's nutritional needs, but due to limited TPN Clinimix  concentrations we will meet at least 80% of protein and 75% of kcal goals.  Per discussion with [employer] and [employer], patient was receiving Clinimix  E 5/15 1000 mL over 24 hours and Intralipid 20% 250 mL over 12 hours while at facility   -Also, each Ensure Plus supplement (ordered BID) provides 350 kcal and 20g of protein     - 1/4: Changed from Clinimix  8/10 to compound TPN (ion based).  Goal TPN rate is 70 mL/hr (provides 80 g of protein and 1615 kcals per day)  RD Assessment: Estimated Needs Total Energy Estimated Needs: 1500-1700 Total Protein Estimated Needs: 75-90g Total Fluid Estimated Needs: 1.7L/day  Current Nutrition:  Regular diet Ensure Plus BID --> increased to TID  on 1/4 TPN to resume this PM  Plan:   At 1800: - Start TPN at 70 ml/hr (compounded, ion based) - Electrolytes in TPN:  Increase Na to 150mEq/L K 50mEq/L Reduce to Ca 2 mEq/L Mg 18mEq/L Phos 15mmol/L Cl:Ac 2:1 - Add standard MVI and trace elements to TPN - Continue  Sensitive q6h SSI for now - Thiamine  100mg  PO daily per RD recommendations - MIVF management per  MD - BMP, magnesium , phosphorus with AM labs - Monitor TPN labs on Mon/Thurs and prn. CMET, phos and Mag on 06/22/23   Iantha Batch, PharmD, BCPS 06/21/2023 8:20 AM

## 2023-06-21 NOTE — Plan of Care (Signed)
  Problem: Clinical Measurements: Goal: Diagnostic test results will improve Outcome: Progressing   Problem: Nutrition: Goal: Adequate nutrition will be maintained Outcome: Progressing    Problem: Safety: Goal: Ability to remain free from injury will improve Outcome: Progressing   

## 2023-06-21 NOTE — TOC Progression Note (Signed)
 Transition of Care [facility]) - Progression Note    Patient Details  Name: [name] MRN: [id number] Date of Birth: 1955-07-11  Transition of Care [facility]) CM/SW Contact  Mitzie LOISE [place], [place] Phone Number: 06/21/2023, 10:57 AM  Clinical Narrative:     Per provider pt is medically ready to discharge to SNF placement. This CSW confirmed with [facility] Admission Coordinator [phone] advised that pt can admit to SNF placement with TPN, however today 06/21/2023 there is no bed availability. Per [name] bed availability will be ready on Monday 06/23/2023 or Tuesday 06/24/2023. CSW/TOC to assist and follow up.    Expected Discharge Plan: Skilled Nursing Facility Barriers to Discharge: Continued Medical Work up  Expected Discharge Plan and Services       Living arrangements for the past 2 months: Single Family Home                                       Social Determinants of Health (SDOH) Interventions SDOH Screenings   Food Insecurity: No Food Insecurity (06/10/2023)  Housing: Low Risk  (06/10/2023)  Transportation Needs: No Transportation Needs (06/10/2023)  Utilities: Not At Risk (06/10/2023)  Depression (PHQ2-9): Low Risk  (12/06/2022)  Financial Resource Strain: Low Risk  (11/06/2022)  Social Connections: Patient Unable To Answer (06/18/2023)  Stress: No Stress Concern Present (11/06/2022)  Tobacco Use: Medium Risk (06/09/2023)    Readmission Risk Interventions    11/21/2022   10:10 AM 11/18/2022    2:11 PM  Readmission Risk Prevention Plan  Post Dischage Appt  Complete  Medication Screening  Complete  Transportation Screening Complete Complete  HRI or Home Care Consult Complete   Social Work Consult for Recovery Care Planning/Counseling Complete   Palliative Care Screening Not Applicable   Medication Review Oceanographer) Complete

## 2023-06-21 NOTE — Progress Notes (Signed)
 Entered room in response to bed exit alarm activation. Found the husband had assisted PT out of bed and unplugged IV pump from wall socket to get her to walk in hallway. PT complaining that she was light headed and dizzy. Husband was encouraging her to work through it [name]!. I insisted she return to bed and at least sit on edge of bed to see if dizziness would resolve. Dizziness did resolve and may have been orthostatic in nature. PT did walk complete circuit around unit with a walker but did fatigue. PT was very hostile verbally to myself and her husband. PT insinuating that I and husband are trying to kill her. She is a high fall risk and her husband may be contributing to an unsafe hospital course. Will continue bed alarms and continued rounding on PT.

## 2023-06-22 DIAGNOSIS — E871 Hypo-osmolality and hyponatremia: Secondary | ICD-10-CM | POA: Diagnosis not present

## 2023-06-22 LAB — CBC WITH DIFFERENTIAL/PLATELET
Abs Immature Granulocytes: 0.03 10*3/uL (ref 0.00–0.07)
Basophils Absolute: 0 10*3/uL (ref 0.0–0.1)
Basophils Relative: 1 %
Eosinophils Absolute: 0.2 10*3/uL (ref 0.0–0.5)
Eosinophils Relative: 4 %
HCT: 29.4 % — ABNORMAL LOW (ref 36.0–46.0)
Hemoglobin: 9.6 g/dL — ABNORMAL LOW (ref 12.0–15.0)
Immature Granulocytes: 1 %
Lymphocytes Relative: 26 %
Lymphs Abs: 1.3 10*3/uL (ref 0.7–4.0)
MCH: 30.2 pg (ref 26.0–34.0)
MCHC: 32.7 g/dL (ref 30.0–36.0)
MCV: 92.5 fL (ref 80.0–100.0)
Monocytes Absolute: 0.4 10*3/uL (ref 0.1–1.0)
Monocytes Relative: 9 %
Neutro Abs: 2.9 10*3/uL (ref 1.7–7.7)
Neutrophils Relative %: 59 %
Platelets: 190 10*3/uL (ref 150–400)
RBC: 3.18 MIL/uL — ABNORMAL LOW (ref 3.87–5.11)
RDW: 15.2 % (ref 11.5–15.5)
WBC: 4.9 10*3/uL (ref 4.0–10.5)
nRBC: 0 % (ref 0.0–0.2)

## 2023-06-22 LAB — GLUCOSE, CAPILLARY
Glucose-Capillary: 126 mg/dL — ABNORMAL HIGH (ref 70–99)
Glucose-Capillary: 158 mg/dL — ABNORMAL HIGH (ref 70–99)
Glucose-Capillary: 187 mg/dL — ABNORMAL HIGH (ref 70–99)
Glucose-Capillary: 219 mg/dL — ABNORMAL HIGH (ref 70–99)
Glucose-Capillary: 272 mg/dL — ABNORMAL HIGH (ref 70–99)
Glucose-Capillary: 273 mg/dL — ABNORMAL HIGH (ref 70–99)

## 2023-06-22 LAB — MAGNESIUM: Magnesium: 2.1 mg/dL (ref 1.7–2.4)

## 2023-06-22 LAB — COMPREHENSIVE METABOLIC PANEL
ALT: 356 U/L — ABNORMAL HIGH (ref 0–44)
AST: 146 U/L — ABNORMAL HIGH (ref 15–41)
Albumin: 2.9 g/dL — ABNORMAL LOW (ref 3.5–5.0)
Alkaline Phosphatase: 492 U/L — ABNORMAL HIGH (ref 38–126)
Anion gap: 9 (ref 5–15)
BUN: 62 mg/dL — ABNORMAL HIGH (ref 8–23)
CO2: 29 mmol/L (ref 22–32)
Calcium: 9.6 mg/dL (ref 8.9–10.3)
Chloride: 96 mmol/L — ABNORMAL LOW (ref 98–111)
Creatinine, Ser: 0.93 mg/dL (ref 0.44–1.00)
GFR, Estimated: >60 mL/min (ref >60)
Glucose, Bld: 108 mg/dL — ABNORMAL HIGH (ref 70–99)
Potassium: 4.2 mmol/L (ref 3.5–5.1)
Sodium: 134 mmol/L — ABNORMAL LOW (ref 135–145)
Total Bilirubin: 0.7 mg/dL (ref 0.0–1.2)
Total Protein: 6.3 g/dL — ABNORMAL LOW (ref 6.5–8.1)

## 2023-06-22 LAB — PHOSPHORUS: Phosphorus: 4.3 mg/dL (ref 2.5–4.6)

## 2023-06-22 MED ORDER — FLUCONAZOLE 150 MG PO TABS
150.0000 mg | ORAL_TABLET | Freq: Once | ORAL | Status: AC
  Administered 2023-06-22: 150 mg via ORAL
  Filled 2023-06-22: qty 1

## 2023-06-22 MED ORDER — TRAVASOL 10 % IV SOLN
INTRAVENOUS | Status: AC
  Filled 2023-06-22: qty 806.4

## 2023-06-22 NOTE — Progress Notes (Signed)
 Mobility Specialist - Progress Note   06/22/23 1548  Mobility  Activity Ambulated with assistance in room  Level of Assistance Minimal assist, patient does 75% or more  Assistive Device Front wheel walker  Distance Ambulated (ft) 60 ft  Range of Motion/Exercises Active  Activity Response Tolerated fair  Mobility Referral Yes  Mobility visit 1 Mobility  Mobility Specialist Start Time (ACUTE ONLY) 1530  Mobility Specialist Stop Time (ACUTE ONLY) 1548  Mobility Specialist Time Calculation (min) (ACUTE ONLY) 18 min   Pt was found in bed wanting to ambulate. Stated feeling BLE weakness, c/o back pain and feeling lightheaded with ambulation. Did x2 STS and took x2 seated rest breaks, once at 41' and another after 15'. At EOS returned to bed with all needs met. Call bell in reach.  Erminio Leos Mobility Specialist

## 2023-06-22 NOTE — Progress Notes (Signed)
 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   Assessment:  68 y/o F with PMH of cervical cancer and rectovaginal fistula s/p redo low anterior resection with colovaginal fistula takedown, fistula repair, and hand sewn end to end sigmoidrectal anastomosis with loop ileostomy on 11/23/18; closure of loop ileostomy 05/01/20, SBO s/p ex-lap with lysis of adhesions on 11/08/20, recurrent SBO s/p ex-lap, LOA, distal ileum and cecum resection with end ileostomy creation on 03/14/2023 started on TPN, right hydronephrosis s/p R ureteral stent placement on 04/02/2023 with subsequent obstructive uropathy, UTI, AKI and bilateral hydronephrosis with placement of right nephrostomy tube on 05/08/2023 who presents with nausea/vomiting and hyponatremia. Pharmacy consulted to resume TPN on admission.   Glucose / Insulin :  No history of DM.  - on sSSI (used 2 units in the past 24 hrs) - CBGs last 24hr (goal <180): 108-173  Electrolytes: Na low but up to 134 (max Na conc in TPN), CL down 96, CorrCa slightly elevated but down with 10.48 this morning, Phos trending up with 4.3 - other lytes wnl  Renal: scr now <1, BUN elevated 62 Hepatic: note hx transaminitis in PMH  - AST/ALT up 356/492 (Depakote  d/ced om 12/28). GI team now suspects elevated LFTs is from course of ceftriaxone  and has signed off.  - Alk phos elevated 492 - Tbili wnl  - TG trending down 57 (12/30) - Albumin  low 2.9 Intake / Output; MIVF: -3573 mL - MIVF: d/ced on 1/3 - UOP: 1.2 ml/kg/hr - Stool output: 3250 mL GI Imaging:  - 12/23 CT abdomen and pelvis: did not show SBO but showed moderate to marked severity right-sided hydronephrosis and proximal hydroureter with percutaneous  right-sided nephrostomy tube and right sided intraureteral stent in place along with mild to moderate severity left-sided hydronephrosis and proximal hydroureter with markedly dilated intrarenal calyces on the left.  GI Surgeries / Procedures: see above  Central access: PICC line in place TPN start date: continued from PTA  - 12/29: per MD's request, TPN stopped at 11a due to elevated LFTs.  - 12/30: Per Dr. Kriss, continue to hold TPN.  - 12/31: Per Dr. Kriss, resume TPN back today  - 1/3: calorie count  started  (result anticipated on 1/6)  Nutritional Goals: Due to the Baxter IV fluid disruption, all adult parenteral nutrition will utilize premade Clinimix  products +/- fat emulsion infusion. Our goal will be to continue providing as close to 100% of our patient's nutritional needs, but due to limited TPN Clinimix  concentrations we will meet at least 80% of protein and 75% of kcal goals.  Per discussion with [employer] and [employer], patient was receiving Clinimix  E 5/15 1000 mL over 24 hours and Intralipid 20% 250 mL over 12 hours while at facility   -Also, each Ensure Plus supplement (ordered BID) provides 350 kcal and 20g of protein     - 1/4: Changed from Clinimix  8/10 to compound TPN (ion based).  Goal TPN rate is 70 mL/hr (provides 80 g of protein and 1615 kcals per day)  RD Assessment: Estimated Needs Total Energy Estimated Needs: 1500-1700 Total Protein Estimated Needs: 75-90g Total Fluid Estimated Needs: 1.7L/day  Current Nutrition:  Regular diet Ensure Plus BID --> increased to TID on 1/4  TPN   Plan:   At 1800: - Start TPN at 70 ml/hr (compounded, ion based) - Electrolytes in TPN:  Continue Na 150 mEq/L K 61mEq/L Continue  Ca 2 mEq/L Mg 83mEq/L Reduce Phos to 10 mmol/L Cl:Ac max Cl - Add standard MVI and trace elements to TPN - Continue Sensitive q6h SSI for now - Thiamine  100mg  PO daily per RD recommendations - MIVF management per MD -  Monitor TPN labs on Mon/Thurs and prn.   Iantha Batch, PharmD, BCPS 06/22/2023 8:13 AM

## 2023-06-22 NOTE — Progress Notes (Addendum)
 PROGRESS NOTE    QUINN BARTLING  FMW:[id number] DOB: 1955-11-20 DOA: 06/09/2023 PCP: Patient, [name]   Brief Narrative:  68 y.o. female with medical history significant of cervical cancer and rectovaginal fistula s/p redo low anterior resection with colovaginal fistula takedown, fistula repair, and hand sewn end to end sigmoidrectal anastomosis with loop ileostomy on 11/23/18; closure of loop ileostomy 05/01/20, SBO s/p ex-lap with lysis of adhesions on 11/08/20, recurrent SBO s/p ex-lap, LOA, distal ileum and cecum resection with end ileostomy creation on 03/14/2023 started on TPN, right hydronephrosis s/p R ureteral stent placement on 04/02/2023 with subsequent obstructive uropathy, UTI, AKI and bilateral hydronephrosis with placement of right nephrostomy tube on 05/08/2023 presented with nausea, vomiting and hyponatremia from SNF.  Patient has been on TPN recently after her recent hospitalization at [facility] and discharged to SNF.  On presentation, she was tachypneic with sodium of 121 (last sodium on 05/24/2023 in care everywhere was 130).  Creatinine was 2.07 (last creatinine was 1.09 on 05/24/2023) with bicarb of 18.  CT of abdomen and pelvis did not show SBO but showed moderate to marked severity right-sided hydronephrosis and proximal hydroureter with percutaneous right-sided nephrostomy tube and right sided intraureteral stent in place along with mild to moderate severity left-sided hydronephrosis and proximal hydroureter with markedly dilated intrarenal calyces on the left.  She was started on IV fluids.  TPN was resumed.  She was started on IV antibiotics for possible UTI.  Urology was also consulted and recommended conservative management and outpatient follow-up.  PT recommending SNF placement.  TOC following.  During the hospitalization, she had elevated LFTs: GI was consulted, TPN held for few days but subsequently been resumed with slight improvement in LFTs.  GI has  signed off.  Currently awaiting SNF placement.  Assessment & Plan:   Hyponatremia -Possibly due to decreased oral intake and likely GI loss. -Sodium 121 on presentation.  Sodium 134 this morning.  Off gentle hydration.  Continue to encourage oral intake.  Off sertraline . -Repeat a.m. labs  Acute kidney injury on chronic kidney disease stage IIIa Acute metabolic acidosis Possible UTI: Present on admission History of bilateral hydronephrosis status post ureteral stent and right nephrostomy tube -Urology evaluation by Dr. Gaston on 06/13/2023 appreciated: Recommended to continue nephrostomy tube for now and outpatient follow-up with regular urologist in [facility] [place]. -UA suggestive of possible UTI.  Treated with 5 days of Rocephin  and subsequently discontinued.  Urine culture grew multiple species.  Hypokalemia -Improved  Hypothyroidism -Continue levothyroxine   Hyperlipidemia -Hold statin due to transaminitis  Transaminitis -CT imaging showed no biliary dilatation.  GI was consulted, TPN held for few days but subsequently been resumed with slight improvement in LFTs.  GI has signed off.  Outpatient follow-up with GI.  LFTs still elevated but stable.  Monitor intermittently.  Nausea and vomiting -Has been an ongoing chronic issue for her due to her complex abdominal surgical history.  No small bowel obstruction demonstrated on CT imaging on presentation.  Continue as needed antiemetics.  Currently improving.  Outpatient follow-up with primary surgical team  Failure to thrive in adult --Pt has extension abdominal surgery due to hx of cervical cancer and currently on TPN  -Palliative care evaluation appreciated.  Patient remains full code. -Patient requesting for TPN to be discontinued.  48-hour calorie count ordered on 06/20/2023.  Follow dietitian's recommendations.  Encourage oral intake.  Will need outpatient follow-up with her general surgeon  History of cervical cancer and  rectovaginal fistula status post surgical intervention -Currently has an ileostomy.  Outpatient follow-up with oncology/GYN oncology/general surgery  Anxiety/depression -Continue mirtazapine  along with as needed Xanax .  Sertraline  discontinued  Physical deconditioning -Patient from SNF.  PT recommending SNF placement.  TOC following  DVT prophylaxis: Lovenox  Code Status: Full Family Communication: Husband at bedside on 06/20/2023.  None at bedside today. disposition Plan: Status is: inpatient because: Of severity of illness.  Need for SNF placement.  Currently medically stable for discharge to SNF  Consultants: Urology.  Palliative care.  GI Procedures: None  Antimicrobials:  Anti-infectives (From admission, onward)    Start     Dose/Rate Route Frequency Ordered Stop   06/20/23 1045  fluconazole  (DIFLUCAN ) tablet 150 mg        150 mg Oral  Once 06/20/23 0958 06/20/23 1112   06/11/23 1200  cefTRIAXone  (ROCEPHIN ) 1 g in sodium chloride  0.9 % 100 mL IVPB        1 g 200 mL/hr over 30 Minutes Intravenous Every 24 hours 06/11/23 1154 06/15/23 1316         Subjective: Patient seen and examined at bedside.  No fever, agitation, vomiting reported.   Objective: Vitals:   06/21/23 1408 06/21/23 2029 06/22/23 0517 06/22/23 0656  BP: 99/84 99/74 (!) 76/53 (!) 91/59  Pulse: (!) 106 96 81 81  Resp:      Temp:  97.7 F (36.5 C) 97.8 F (36.6 C)   TempSrc:  Oral Oral   SpO2: 100% 100% 100%   Weight:   49.5 kg   Height:        Intake/Output Summary (Last 24 hours) at 06/22/2023 1021 Last data filed at 06/22/2023 0519 Gross per 24 hour  Intake 966.58 ml  Output 4300 ml  Net -3333.42 ml   Filed Weights   06/20/23 0356 06/21/23 0500 06/22/23 0517  Weight: 45.4 kg 46 kg 49.5 kg    Examination:  General: No acute distress.  Currently on room air.  Chronically ill and deconditioned looking. ENT/neck: No obvious neck masses or JVD elevation noted  respiratory: Decreased breath  sounds at bases bilaterally with some crackles  CVS: Rate mostly controlled; S1 and S2 are heard  abdominal: Soft, nontender; slightly distended; no organomegaly, normal bowel sounds heard.  Ileostomy bag is present  genitourinary: Right-sided nephrostomy tube present  extremities: No; mild lower extremity edema present  CNS: Wakes up slightly, still slow to respond.  Poor historian.  No obvious focal deficits noted lymph: No palpable lymphadenopathy skin: No obvious petechiae/lesions psych: Mostly flat affect.  Not agitated currently.  Does not participate in conversation much  musculoskeletal: No obvious joint erythema/swelling  data Reviewed: I have personally reviewed following labs and imaging studies  CBC: Recent Labs  Lab 06/16/23 0308 06/17/23 0255 06/18/23 0432 06/19/23 0419 06/20/23 0412 06/22/23 0442  WBC 4.6 4.5 4.4 4.7 4.2 4.9  NEUTROABS 2.5  --   --   --   --  2.9  HGB 10.4* 10.7* 9.9* 10.0* 9.2* 9.6*  HCT 30.6* 33.9* 30.1* 30.7* 27.6* 29.4*  MCV 90.3 91.4 91.8 91.9 91.4 92.5  PLT 190 203 177 216 167 190   Basic Metabolic Panel: Recent Labs  Lab 06/18/23 0432 06/19/23 0419 06/20/23 0412 06/21/23 0456 06/22/23 0442  NA 131* 130* 132* 133* 134*  K 3.9 3.5 3.1* 4.0 4.2  CL 93* 95* 100 97* 96*  CO2 29 25 24 28 29   GLUCOSE 149* 102* 108* 133* 108*  BUN 47* 56* 63*  67* 62*  CREATININE 1.24* 1.23* 0.92 0.95 0.93  CALCIUM  8.9 9.2 9.2 9.7 9.6  MG 1.9 2.0 1.8 2.1 2.1  PHOS 3.6 3.7 3.2 3.9 4.3   GFR: Estimated Creatinine Clearance: 45.9 mL/min (by C-G formula based on SCr of 0.93 mg/dL). Liver Function Tests: Recent Labs  Lab 06/17/23 0255 06/18/23 0432 06/19/23 0419 06/20/23 0412 06/22/23 0442  AST 223* 121* 116* 146* 146*  ALT 469* 343* 288* 328* 356*  ALKPHOS 518* 481* 441* 430* 492*  BILITOT 0.7 0.3 0.4 0.4 0.7  PROT 6.8 6.3* 6.2* 5.9* 6.3*  ALBUMIN  2.9* 2.8* 2.8* 2.7* 2.9*   No results for input(s): LIPASE, AMYLASE in the last 168  hours.  No results for input(s): AMMONIA in the last 168 hours. Coagulation Profile: No results for input(s): INR, PROTIME in the last 168 hours. Cardiac Enzymes: No results for input(s): CKTOTAL, CKMB, CKMBINDEX, TROPONINI in the last 168 hours. BNP (last 3 results) No results for input(s): PROBNP in the last 8760 hours. HbA1C: No results for input(s): HGBA1C in the last 72 hours. CBG: Recent Labs  Lab 06/21/23 1123 06/21/23 1818 06/22/23 0003 06/22/23 0603 06/22/23 0722  GLUCAP 212* 211* 273* 126* 158*   Lipid Profile: No results for input(s): CHOL, HDL, LDLCALC, TRIG, CHOLHDL, LDLDIRECT in the last 72 hours.  Thyroid  Function Tests: No results for input(s): TSH, T4TOTAL, FREET4, T3FREE, THYROIDAB in the last 72 hours.  Anemia Panel: No results for input(s): VITAMINB12, FOLATE, FERRITIN, TIBC, IRON, RETICCTPCT in the last 72 hours.  Sepsis Labs: No results for input(s): PROCALCITON, LATICACIDVEN in the last 168 hours.  No results found for this or any previous visit (from the past 240 hours).        Radiology Studies: No results found.       Scheduled Meds:  acetaminophen   1,000 mg Oral Once   Chlorhexidine  Gluconate Cloth  6 each Topical Daily   enoxaparin  (LOVENOX ) injection  40 mg Subcutaneous Q24H   feeding supplement  237 mL Oral TID BM   insulin  aspart  0-9 Units Subcutaneous Q6H   levothyroxine   25 mcg Oral Q0600   lidocaine   1 patch Transdermal Q24H   LORazepam   1 mg Intravenous Once   mirtazapine   7.5 mg Oral QHS   pantoprazole   40 mg Oral Daily   potassium chloride   20 mEq Oral Once   sodium chloride  flush  10-40 mL Intracatheter Q12H   thiamine   100 mg Oral Daily   Continuous Infusions:  TPN ADULT (ION) 70 mL/hr at 06/21/23 1731   TPN ADULT (ION)            Sophie Mao, MD [facility] 06/22/2023, 10:21 AM

## 2023-06-22 NOTE — Plan of Care (Signed)
  Problem: Education: Goal: Knowledge of General Education information will improve Description: Including pain rating scale, medication(s)/side effects and non-pharmacologic comfort measures Outcome: Progressing   Problem: Clinical Measurements: Goal: Diagnostic test results will improve Outcome: Progressing Goal: Respiratory complications will improve Outcome: Progressing   Problem: Activity: Goal: Risk for activity intolerance will decrease Outcome: Progressing   Problem: Nutrition: Goal: Adequate nutrition will be maintained Outcome: Progressing   Problem: Coping: Goal: Level of anxiety will decrease Outcome: Not Progressing

## 2023-06-23 ENCOUNTER — Encounter (HOSPITAL_COMMUNITY): Payer: Self-pay | Admitting: Radiology

## 2023-06-23 DIAGNOSIS — E871 Hypo-osmolality and hyponatremia: Secondary | ICD-10-CM | POA: Diagnosis not present

## 2023-06-23 LAB — CBC WITH DIFFERENTIAL/PLATELET
Abs Immature Granulocytes: 0.02 10*3/uL (ref 0.00–0.07)
Basophils Absolute: 0 10*3/uL (ref 0.0–0.1)
Basophils Relative: 1 %
Eosinophils Absolute: 0.2 10*3/uL (ref 0.0–0.5)
Eosinophils Relative: 4 %
HCT: 27.6 % — ABNORMAL LOW (ref 36.0–46.0)
Hemoglobin: 9.1 g/dL — ABNORMAL LOW (ref 12.0–15.0)
Immature Granulocytes: 1 %
Lymphocytes Relative: 30 %
Lymphs Abs: 1.2 10*3/uL (ref 0.7–4.0)
MCH: 30.7 pg (ref 26.0–34.0)
MCHC: 33 g/dL (ref 30.0–36.0)
MCV: 93.2 fL (ref 80.0–100.0)
Monocytes Absolute: 0.4 10*3/uL (ref 0.1–1.0)
Monocytes Relative: 10 %
Neutro Abs: 2.2 10*3/uL (ref 1.7–7.7)
Neutrophils Relative %: 54 %
Platelets: 192 10*3/uL (ref 150–400)
RBC: 2.96 MIL/uL — ABNORMAL LOW (ref 3.87–5.11)
RDW: 15.6 % — ABNORMAL HIGH (ref 11.5–15.5)
WBC: 4 10*3/uL (ref 4.0–10.5)
nRBC: 0 % (ref 0.0–0.2)

## 2023-06-23 LAB — COMPREHENSIVE METABOLIC PANEL
ALT: 317 U/L — ABNORMAL HIGH (ref 0–44)
AST: 115 U/L — ABNORMAL HIGH (ref 15–41)
Albumin: 2.7 g/dL — ABNORMAL LOW (ref 3.5–5.0)
Alkaline Phosphatase: 458 U/L — ABNORMAL HIGH (ref 38–126)
Anion gap: 9 (ref 5–15)
BUN: 65 mg/dL — ABNORMAL HIGH (ref 8–23)
CO2: 28 mmol/L (ref 22–32)
Calcium: 9.4 mg/dL (ref 8.9–10.3)
Chloride: 100 mmol/L (ref 98–111)
Creatinine, Ser: 1.2 mg/dL — ABNORMAL HIGH (ref 0.44–1.00)
GFR, Estimated: 50 mL/min — ABNORMAL LOW (ref >60)
Glucose, Bld: 70 mg/dL (ref 70–99)
Potassium: 4 mmol/L (ref 3.5–5.1)
Sodium: 137 mmol/L (ref 135–145)
Total Bilirubin: 0.7 mg/dL (ref 0.0–1.2)
Total Protein: 6 g/dL — ABNORMAL LOW (ref 6.5–8.1)

## 2023-06-23 LAB — PHOSPHORUS: Phosphorus: 3.6 mg/dL (ref 2.5–4.6)

## 2023-06-23 LAB — GLUCOSE, CAPILLARY
Glucose-Capillary: 169 mg/dL — ABNORMAL HIGH (ref 70–99)
Glucose-Capillary: 201 mg/dL — ABNORMAL HIGH (ref 70–99)

## 2023-06-23 LAB — TRIGLYCERIDES: Triglycerides: 295 mg/dL — ABNORMAL HIGH (ref <150)

## 2023-06-23 LAB — MAGNESIUM: Magnesium: 2.1 mg/dL (ref 1.7–2.4)

## 2023-06-23 NOTE — Progress Notes (Signed)
 Physical Therapy Treatment Patient Details Name: MITCHELLE SULTAN MRN: [id number] DOB: March 10, 1956 Today's Date: 06/23/2023   History of Present Illness Pt is a 68 year old woman admitted 12/23 from [facility], where she was receiving rehab, with hyponatremia and AKI associated with nausea and vomiting. PMH: cervical cancer, colovaginal fistula, multiple abdominal surgeries, recent nephrostomy due to hydronephrosis, CKD, anxiety, headaches, UTI.    PT Comments  Pt reluctantly agreeable to work with therapy. Husband present to encourage. She walked to/from bathroom without a device. Used RW for ambulation safety when walking in hallway. She continues to require cues for safety. She remains at risk for falls when mobilizing. Patient will benefit from continued inpatient follow up therapy, <3 hours/day     If plan is discharge home, recommend the following: A little help with walking and/or transfers;A little help with bathing/dressing/bathroom;Assist for transportation;Assistance with cooking/housework;Help with stairs or ramp for entrance   Can travel by private vehicle        Equipment Recommendations  None recommended by PT    Recommendations for Other Services       Precautions / Restrictions Precautions Precautions: Fall Precaution Comments: R ileostomy, R nephrostomy drain Restrictions Weight Bearing Restrictions Per Provider Order: No     Mobility  Bed Mobility               General bed mobility comments: sitting EOB with husband present    Transfers Overall transfer level: Needs assistance Equipment used: Rolling walker (2 wheels), None Transfers: Sit to/from Stand Sit to Stand: Contact guard assist           General transfer comment: Little regard for lines/drains/safety. Cues for safety    Ambulation/Gait Ambulation/Gait assistance: Contact guard assist Gait Distance (Feet): 175 Feet Assistive device: Rolling walker (2 wheels) Gait  Pattern/deviations: Step-through pattern, Decreased step length - left, Decreased step length - right, Trunk flexed       General Gait Details: patient ambulating quickly, turning quickly, cues for safety as therapist kept up with lines. c/o increased back pain towards end of distance. not responsive to cues for posture.   Stairs             Wheelchair Mobility     Tilt Bed    Modified Rankin (Stroke Patients Only)       Balance Overall balance assessment: Needs assistance         Standing balance support: During functional activity Standing balance-Leahy Scale: Fair                              Cognition Arousal: Alert Behavior During Therapy: Agitated, Impulsive Overall Cognitive Status: Impaired/Different from baseline Area of Impairment: Orientation, Attention, Memory, Following commands, Safety/judgement, Awareness, Problem solving                 Orientation Level: Situation, Time, Disoriented to Current Attention Level: Focused Memory: Decreased short-term memory, Decreased recall of precautions Following Commands: Follows one step commands with increased time, Follows one step commands inconsistently Safety/Judgement: Decreased awareness of safety, Decreased awareness of deficits   Problem Solving: Requires verbal cues, Requires tactile cues General Comments: Patient  anxious and impulsive, negative comments about/to spouse(in room), states she will fall while walking. Redirection attrempted, patient did  participate reluctantly. Cues to wait  for therapy to get drains and lines in order        Exercises      General Comments  Pertinent Vitals/Pain Pain Assessment Pain Assessment: Faces Faces Pain Scale: Hurts little more Pain Location: back Pain Descriptors / Indicators: Grimacing, Discomfort Pain Intervention(s): Limited activity within patient's tolerance, Monitored during session, Repositioned    Home Living                           Prior Function            PT Goals (current goals can now be found in the care plan section) Progress towards PT goals: Progressing toward goals    Frequency    Min 1X/week      PT Plan      Co-evaluation              AM-PAC PT 6 Clicks Mobility   Outcome Measure  Help needed turning from your back to your side while in a flat bed without using bedrails?: A Little Help needed moving from lying on your back to sitting on the side of a flat bed without using bedrails?: A Little Help needed moving to and from a bed to a chair (including a wheelchair)?: A Little Help needed standing up from a chair using your arms (e.g., wheelchair or bedside chair)?: A Little Help needed to walk in hospital room?: A Little Help needed climbing 3-5 steps with a railing? : A Little 6 Click Score: 18    End of Session   Activity Tolerance: Patient tolerated treatment well Patient left: in chair;with call bell/phone within reach;with chair alarm set;with family/visitor present   PT Visit Diagnosis: Unsteadiness on feet (R26.81);Muscle weakness (generalized) (M62.81);Difficulty in walking, not elsewhere classified (R26.2);Pain     Time: 1538-1550 PT Time Calculation (min) (ACUTE ONLY): 12 min  Charges:    $Gait Training: 8-22 mins PT General Charges $$ ACUTE PT VISIT: 1 Visit                         Dannial SQUIBB, PT Acute Rehabilitation  Office: [phone]

## 2023-06-23 NOTE — Progress Notes (Signed)
 PROGRESS NOTE    [name]  FMW:[id number] DOB: 1956/02/14 DOA: 06/09/2023 PCP: Patient, [name]   Brief Narrative:  This 68 y.o. female with medical history significant of cervical cancer and rectovaginal fistula s/p redo low anterior resection with colovaginal fistula takedown, fistula repair, and hand sewn end to end sigmoidrectal anastomosis with loop ileostomy on 11/23/18; closure of loop ileostomy 05/01/20, SBO s/p ex-lap with lysis of adhesions on 11/08/20, recurrent SBO s/p ex-lap, LOA, distal ileum and cecum resection with end ileostomy creation on 03/14/2023 started on TPN, right hydronephrosis s/p R ureteral stent placement on 04/02/2023 with subsequent obstructive uropathy, UTI, AKI and bilateral hydronephrosis with placement of right nephrostomy tube on 05/08/2023 presented with nausea, vomiting and hyponatremia from SNF.  Patient has been on TPN recently after her recent hospitalization at [facility] and discharged to SNF.  On presentation, she was tachypneic with sodium of 121 (last sodium on 05/24/2023 in care everywhere was 130).  Creatinine was 2.07 (last creatinine was 1.09 on 05/24/2023) with bicarb of 18.  CT of abdomen and pelvis did not show SBO but showed moderate to marked severity right-sided hydronephrosis and proximal hydroureter with percutaneous right-sided nephrostomy tube and right sided intraureteral stent in place along with mild to moderate severity left-sided hydronephrosis and proximal hydroureter with markedly dilated intrarenal calyces on the left.  She was started on IV fluids.  TPN was resumed.  She was started on IV antibiotics for possible UTI.  Urology was also consulted and recommended conservative management and outpatient follow-up.  PT recommending SNF placement.  TOC following.  During the hospitalization, she had elevated LFTs: GI was consulted, TPN held for few days but subsequently been resumed with slight improvement in LFTs.  GI  has signed off.  Currently awaiting SNF placement.   Assessment & Plan:   Principal Problem:   Hyponatremia Active Problems:   Acute-on-chronic kidney injury (HCC)   Hypothyroidism   Mixed hyperlipidemia   Failure to thrive in adult   Nausea & vomiting   Transaminitis   Metabolic acidosis with normal anion gap and bicarbonate losses   Bilateral hydronephrosis   Tremors of nervous system   Hyponatremia -Possibly due to decreased oral intake and likely GI loss. -Sodium 121 on presentation.  Sodium 137 this morning.  Off gentle hydration.   -Continue to encourage oral intake.  Off sertraline . -Repeat a.m. labs   Acute kidney injury on chronic kidney disease stage IIIa Acute metabolic acidosis Possible UTI: Present on admission History of bilateral hydronephrosis status post ureteral stent and right nephrostomy tube -Urology evaluation by Dr. Gaston on 06/13/2023 appreciated: Recommended to continue nephrostomy tube for now and outpatient follow-up with regular urologist in [facility]. -UA suggestive of possible UTI.  Treated with 5 days of Rocephin  and subsequently discontinued.  Urine culture grew multiple species.   Hypokalemia: -Improved   Hypothyroidism: -Continue levothyroxine    Hyperlipidemia -Hold statin due to transaminitis.   Transaminitis -CT imaging showed no biliary dilatation.  GI was consulted, TPN held for few days but subsequently been resumed with slight improvement in LFTs.  GI has signed off.  Outpatient follow-up with GI.  LFTs still elevated but stable.  Monitor intermittently.   Nausea and vomiting: -Has been an ongoing chronic issue for her due to her complex abdominal surgical history.  No small bowel obstruction demonstrated on CT imaging on presentation.  Continue as needed antiemetics.  Currently improving.  Outpatient follow-up with primary surgical team  Failure to thrive in adult: --Pt has extensive abdominal surgery due to hx of cervical  cancer and currently on TPN  -Palliative care evaluation appreciated.  Patient remains full code. -Patient requesting for TPN to be discontinued.  48-hour calorie count ordered on 06/20/2023.  Follow dietitian's recommendations.  Encourage oral intake.  Will need outpatient follow-up with her general surgeon. -TPN discontinued.   History of cervical cancer and rectovaginal fistula status post surgical intervention: -Currently has an ileostomy.  Outpatient follow-up with oncology/GYN oncology/general surgery   Anxiety/depression -Continue mirtazapine  along with as needed Xanax .  Sertraline  discontinued   Physical deconditioning -Patient from SNF.  PT recommending SNF placement.  TOC following   DVT prophylaxis: Lovenox  Code Status: Full code Family Communication: No family at bed side Disposition Plan:   Status is: Inpatient Remains inpatient appropriate because: due to severity of illness.    Consultants:  Urology GI General surgery  Procedures: Antimicrobials:    Subjective: Patient was seen and examined at bedside.  Overnight events noted.   Patient reports doing much better.  She wants to be discharged.   TPN discontinued the patient is able to more than 50% of her meals.  Objective: Vitals:   06/22/23 1258 06/22/23 2145 06/23/23 0515 06/23/23 1130  BP: 103/63 94/64 (!) 101/58 101/62  Pulse: 96 89 88 80  Resp: 14 16  16   Temp: 97.6 F (36.4 C) 98.5 F (36.9 C) 97.8 F (36.6 C) 98.3 F (36.8 C)  TempSrc: Oral Oral Oral Oral  SpO2: 98% 100% 100% 100%  Weight:   44.2 kg   Height:        Intake/Output Summary (Last 24 hours) at 06/23/2023 1458 Last data filed at 06/23/2023 1231 Gross per 24 hour  Intake 1093.4 ml  Output 3775 ml  Net -2681.6 ml   Filed Weights   06/21/23 0500 06/22/23 0517 06/23/23 0515  Weight: 46 kg 49.5 kg 44.2 kg    Examination:  General exam: Appears calm and comfortable, not in any acute distress Respiratory system: Clear to  auscultation. Respiratory effort normal.  RR 15 Cardiovascular system: S1 & S2 heard, RRR. No JVD, murmurs, rubs, gallops or clicks. No pedal edema. Gastrointestinal system: Abdomen is non distended, soft and non tender. Normal bowel sounds heard. Central nervous system: Alert and oriented. No focal neurological deficits. Extremities: No edema, no cyanosis, no clubbing Skin: No rashes, lesions or ulcers Psychiatry: Judgement and insight appear normal. Mood & affect appropriate.     Data Reviewed: I have personally reviewed following labs and imaging studies  CBC: Recent Labs  Lab 06/18/23 0432 06/19/23 0419 06/20/23 0412 06/22/23 0442 06/23/23 0333  WBC 4.4 4.7 4.2 4.9 4.0  NEUTROABS  --   --   --  2.9 2.2  HGB 9.9* 10.0* 9.2* 9.6* 9.1*  HCT 30.1* 30.7* 27.6* 29.4* 27.6*  MCV 91.8 91.9 91.4 92.5 93.2  PLT 177 216 167 190 192   Basic Metabolic Panel: Recent Labs  Lab 06/19/23 0419 06/20/23 0412 06/21/23 0456 06/22/23 0442 06/23/23 0333  NA 130* 132* 133* 134* 137  K 3.5 3.1* 4.0 4.2 4.0  CL 95* 100 97* 96* 100  CO2 25 24 28 29 28   GLUCOSE 102* 108* 133* 108* 70  BUN 56* 63* 67* 62* 65*  CREATININE 1.23* 0.92 0.95 0.93 1.20*  CALCIUM  9.2 9.2 9.7 9.6 9.4  MG 2.0 1.8 2.1 2.1 2.1  PHOS 3.7 3.2 3.9 4.3 3.6   GFR: Estimated Creatinine Clearance: 31.7 mL/min (A) (  by C-G formula based on SCr of 1.2 mg/dL (H)). Liver Function Tests: Recent Labs  Lab 06/18/23 0432 06/19/23 0419 06/20/23 0412 06/22/23 0442 06/23/23 0333  AST 121* 116* 146* 146* 115*  ALT 343* 288* 328* 356* 317*  ALKPHOS 481* 441* 430* 492* 458*  BILITOT 0.3 0.4 0.4 0.7 0.7  PROT 6.3* 6.2* 5.9* 6.3* 6.0*  ALBUMIN  2.8* 2.8* 2.7* 2.9* 2.7*   No results for input(s): LIPASE, AMYLASE in the last 168 hours. No results for input(s): AMMONIA in the last 168 hours. Coagulation Profile: No results for input(s): INR, PROTIME in the last 168 hours. Cardiac Enzymes: No results for input(s):  CKTOTAL, CKMB, CKMBINDEX, TROPONINI in the last 168 hours. BNP (last 3 results) No results for input(s): PROBNP in the last 8760 hours. HbA1C: No results for input(s): HGBA1C in the last 72 hours. CBG: Recent Labs  Lab 06/22/23 1119 06/22/23 1630 06/22/23 2354 06/23/23 0554 06/23/23 1127  GLUCAP 219* 187* 272* 169* 201*   Lipid Profile: Recent Labs    06/23/23 0333  TRIG 295*   Thyroid  Function Tests: No results for input(s): TSH, T4TOTAL, FREET4, T3FREE, THYROIDAB in the last 72 hours. Anemia Panel: No results for input(s): VITAMINB12, FOLATE, FERRITIN, TIBC, IRON, RETICCTPCT in the last 72 hours. Sepsis Labs: No results for input(s): PROCALCITON, LATICACIDVEN in the last 168 hours.  No results found for this or any previous visit (from the past 240 hours).   Radiology Studies: No results found.  Scheduled Meds:  acetaminophen   1,000 mg Oral Once   Chlorhexidine  Gluconate Cloth  6 each Topical Daily   enoxaparin  (LOVENOX ) injection  40 mg Subcutaneous Q24H   feeding supplement  237 mL Oral TID BM   levothyroxine   25 mcg Oral Q0600   LORazepam   1 mg Intravenous Once   mirtazapine   7.5 mg Oral QHS   pantoprazole   40 mg Oral Daily   potassium chloride   20 mEq Oral Once   sodium chloride  flush  10-40 mL Intracatheter Q12H   thiamine   100 mg Oral Daily   Continuous Infusions:  TPN ADULT (ION) 40 mL/hr at 06/23/23 1219     LOS: 13 days    Time spent: 50 mins    Darcel Dawley, MD [facility]   If 7PM-7AM, please contact night-coverage

## 2023-06-23 NOTE — Progress Notes (Signed)
 Calorie Count Note  48 hour calorie count ordered.  Diet: Regular Supplements: Ensure Plus High Protein TID  Day 1 (1/3): Lunch: 133 kcals, 9g protein Dinner: 161 kcals, 8g protein Supplements: 2 Ensures Total intake: 994 kcal (66% of minimum estimated needs)  57 protein (75% of minimum estimated needs)   Day 1 (1/4): Breakfast: 45 kcals, 1g protein Lunch: 775 kcals, 27g protein Snack brought in: 490 kcals, 16g protein Dinner: 30 kcals, 1g protein Supplements: 3 Ensures Total intake: 2390 kcal (>100% of estimated needs)  105 protein (>100% of estimated needs)  Day 1 (1/5): Breakfast: 125 kcals, 3g protein Lunch: 760 kcals, 15g protein Dinner: 279 kcals, 17g protein Snack: 317 kcals, 12g protein Supplements: 100% 2 Ensures, 25% a 3rd Ensure Total intake: 2268 kcal (>100% of estimated needs)  92 protein (>100% of estimated needs)  Nutrition Dx: Inadequate oral intake related to nausea, vomiting, altered GI function as evidenced by per patient/family report.  *Resolved  Goal: Patient will meet greater than or equal to 90% of their needs   Subjective: Discussed results with MD. Patient eating very well and exceeding estimated needs. Per discussion with MD, plan to discontinue TPN after today.   Intervention:  - Regular diet.  - Ensure Plus High Protein po TID, each supplement provides 350 kcal and 20 grams of protein. - Monitor weight trends.    Trude Ned RD, LDN Contact via [employer].

## 2023-06-23 NOTE — TOC Progression Note (Addendum)
 Transition of Care [facility]) - Progression Note    Patient Details  Name: [name] MRN: [id number] Date of Birth: 22-Dec-1955  Transition of Care [facility]) CM/SW Contact  Tawni [name], LCSW Phone Number: 06/23/2023, 9:33 AM  Clinical Narrative:    CSW attempted to contact [name] from [facility] to inquire about bed availability, she sent message stating she will call this CSW back.TOC to follow.     ADDEN 10:14am CSW received a message from [facility] Admissions with [facility], stating that there are no female beds available today. The pt's insurance authorization expires today, with a one-day grace period. TOC will follow  Expected Discharge Plan: Skilled Nursing Facility Barriers to Discharge: Continued Medical Work up  Expected Discharge Plan and Services       Living arrangements for the past 2 months: Single Family Home                                       Social Determinants of Health (SDOH) Interventions SDOH Screenings   Food Insecurity: No Food Insecurity (06/10/2023)  Housing: Low Risk  (06/10/2023)  Transportation Needs: No Transportation Needs (06/10/2023)  Utilities: Not At Risk (06/10/2023)  Depression (PHQ2-9): Low Risk  (12/06/2022)  Financial Resource Strain: Low Risk  (11/06/2022)  Social Connections: Patient Unable To Answer (06/18/2023)  Stress: No Stress Concern Present (11/06/2022)  Tobacco Use: Medium Risk (06/09/2023)    Readmission Risk Interventions    11/21/2022   10:10 AM 11/18/2022    2:11 PM  Readmission Risk Prevention Plan  Post Dischage Appt  Complete  Medication Screening  Complete  Transportation Screening Complete Complete  HRI or Home Care Consult Complete   Social Work Consult for Recovery Care Planning/Counseling Complete   Palliative Care Screening Not Applicable   Medication Review Oceanographer) Complete

## 2023-06-23 NOTE — Procedures (Signed)
  Presented to patient's room, to assess right nephrostomy tube, after removal of dressing, suture noticeably pulled back, bag has been draining appropriately all day, IR team attached a Stat Lock Stabilizing device.  Site was redressed, patient instructed to inform radiology if tube stops draining or excessive leaking around site.     See Rolin Geralds or Franky Allred's notes as well

## 2023-06-23 NOTE — Plan of Care (Signed)
  Problem: Clinical Measurements: Goal: Diagnostic test results will improve Outcome: Progressing   Problem: Activity: Goal: Risk for activity intolerance will decrease Outcome: Progressing   Problem: Nutrition: Goal: Adequate nutrition will be maintained Outcome: Progressing   Problem: Pain Management: Goal: General experience of comfort will improve Outcome: Progressing   Problem: Safety: Goal: Ability to remain free from injury will improve Outcome: Progressing

## 2023-06-23 NOTE — Plan of Care (Signed)
  Problem: Clinical Measurements: Goal: Diagnostic test results will improve Outcome: Progressing   Problem: Activity: Goal: Risk for activity intolerance will decrease Outcome: Progressing   Problem: Nutrition: Goal: Adequate nutrition will be maintained Outcome: Progressing   Problem: Coping: Goal: Level of anxiety will decrease Outcome: Not Progressing

## 2023-06-24 DIAGNOSIS — E871 Hypo-osmolality and hyponatremia: Secondary | ICD-10-CM | POA: Diagnosis not present

## 2023-06-24 LAB — CBC
HCT: 28.3 % — ABNORMAL LOW (ref 36.0–46.0)
Hemoglobin: 9 g/dL — ABNORMAL LOW (ref 12.0–15.0)
MCH: 30 pg (ref 26.0–34.0)
MCHC: 31.8 g/dL (ref 30.0–36.0)
MCV: 94.3 fL (ref 80.0–100.0)
Platelets: 182 10*3/uL (ref 150–400)
RBC: 3 MIL/uL — ABNORMAL LOW (ref 3.87–5.11)
RDW: 15.9 % — ABNORMAL HIGH (ref 11.5–15.5)
WBC: 3.7 10*3/uL — ABNORMAL LOW (ref 4.0–10.5)
nRBC: 0 % (ref 0.0–0.2)

## 2023-06-24 LAB — BASIC METABOLIC PANEL
Anion gap: 9 (ref 5–15)
BUN: 59 mg/dL — ABNORMAL HIGH (ref 8–23)
CO2: 27 mmol/L (ref 22–32)
Calcium: 9.1 mg/dL (ref 8.9–10.3)
Chloride: 97 mmol/L — ABNORMAL LOW (ref 98–111)
Creatinine, Ser: 1.22 mg/dL — ABNORMAL HIGH (ref 0.44–1.00)
GFR, Estimated: 49 mL/min — ABNORMAL LOW (ref >60)
Glucose, Bld: 70 mg/dL (ref 70–99)
Potassium: 3.9 mmol/L (ref 3.5–5.1)
Sodium: 133 mmol/L — ABNORMAL LOW (ref 135–145)

## 2023-06-24 LAB — MAGNESIUM: Magnesium: 2.1 mg/dL (ref 1.7–2.4)

## 2023-06-24 LAB — PHOSPHORUS: Phosphorus: 4.2 mg/dL (ref 2.5–4.6)

## 2023-06-24 MED ORDER — SODIUM CHLORIDE 0.9 % IV BOLUS
250.0000 mL | Freq: Once | INTRAVENOUS | Status: AC
  Administered 2023-06-24: 250 mL via INTRAVENOUS

## 2023-06-24 NOTE — Discharge Instructions (Signed)
 Advised to follow-up with primary care physician in 1 week. Advised to follow-up with urology as scheduled

## 2023-06-24 NOTE — TOC Progression Note (Signed)
 Transition of Care [facility]) - Progression Note    Patient Details  Name: [name] MRN: [id number] Date of Birth: 08-24-1955  [facility] CM/SW Contact  Tawni [name], LCSW Phone Number: 06/24/2023, 9:19 AM  Clinical Narrative:    CSW spoke with [name] from [facility]. She reports that they will not have a female bed available until Friday, and if this changes, they will contact CSW regarding bed availability. The pt's insurance authorization expires today, and a new authorization will be needed when a bed becomes available. TOC to follow.   Expected Discharge Plan: Skilled Nursing Facility Barriers to Discharge: Continued Medical Work up  Expected Discharge Plan and Services       Living arrangements for the past 2 months: Single Family Home                                       Social Determinants of Health (SDOH) Interventions SDOH Screenings   Food Insecurity: No Food Insecurity (06/10/2023)  Housing: Low Risk  (06/10/2023)  Transportation Needs: No Transportation Needs (06/10/2023)  Utilities: Not At Risk (06/10/2023)  Depression (PHQ2-9): Low Risk  (12/06/2022)  Financial Resource Strain: Low Risk  (11/06/2022)  Social Connections: Patient Unable To Answer (06/18/2023)  Stress: No Stress Concern Present (11/06/2022)  Tobacco Use: Medium Risk (06/09/2023)    Readmission Risk Interventions    11/21/2022   10:10 AM 11/18/2022    2:11 PM  Readmission Risk Prevention Plan  Post Dischage Appt  Complete  Medication Screening  Complete  Transportation Screening Complete Complete  HRI or Home Care Consult Complete   Social Work Consult for Recovery Care Planning/Counseling Complete   Palliative Care Screening Not Applicable   Medication Review Oceanographer) Complete

## 2023-06-24 NOTE — Progress Notes (Signed)
 [facility] consult. Educated patient on the procedure of pulling the PICC line. Patient HOB less than 45*. Pt held breath for 30 min. Pressure held directly to site. No bleeding noted. Instructed pt to monitor and report any s/sx of bleeding. Pressure drsg applied. Instructed pt to remain in bed for 30 min and to keep drsg CDI for 24 hrs. Notified nurse. Pt Vu. Powell Bowler, RN VAST

## 2023-06-24 NOTE — Consult Note (Signed)
 [facility] Liaison Consult Note   06/24/2023  BEV DRENNEN 03/23/1956 [id number]  Follow up: Update, patient at [facility]  Patient shows as active with [facility] RN CC. Patient has been recommended for SNF however is denying SNF and to have HH with [facility] noted. .She has a new appointment for PCP on 06/26/23.  Updated [facility] RN [facility] of needs for appointment on 06/26/23 with PCP and with RN 07/03/23 noted.  For questions,  Richerd Fish, RN, BSN, CCM [place]  [facility], [facility] Liaison Direct Dial: [phone] or secure chat Email: [email or web address]

## 2023-06-24 NOTE — Progress Notes (Signed)
 AVS and discharge instructions reviewed with patient and husband. Both parties verbalized understanding.

## 2023-06-24 NOTE — Plan of Care (Signed)
   Problem: Activity: Goal: Risk for activity intolerance will decrease Outcome: Progressing   Problem: Nutrition: Goal: Adequate nutrition will be maintained Outcome: Progressing   Problem: Coping: Goal: Level of anxiety will decrease Outcome: Progressing

## 2023-06-24 NOTE — TOC Transition Note (Signed)
 Transition of Care [facility]) - Discharge Note   Patient Details  Name: [name] MRN: [id number] Date of Birth: [birth year]  Transition of Care [facility]) CM/SW Contact:  Tawni [name], LCSW Phone Number: 06/24/2023, 11:07 AM   Clinical Narrative:    CSW met with the pt and the pt's husband. They have declined SNF and would like to return home with home health services. The pt has no preference for a home health agency and only wants HHRN. CSW reached out to:  [facility]-declined [facility]- declined [facility]-declined [facility]-declined  [facility]-declined  [facility]-declined  [facility] was able to accept the pt for [facility]; however, the pt will need to follow up with [facility], appointment was made for January 9th. Pt has no other TOC needs. TOC sign off.    Final next level of care: Home w Home Health Services Barriers to Discharge: No Barriers Identified   Patient Goals and CMS Choice Patient states their goals for this hospitalization and ongoing recovery are:: retun home          Discharge Placement                  Name of family member notified: Taralyn, Ferraiolo (Spouse)  [phone] (Mobile) Patient and family notified of of transfer: 06/24/23  Discharge Plan and Services Additional resources added to the After Visit Summary for                                       Social Drivers of Health (SDOH) Interventions SDOH Screenings   Food Insecurity: No Food Insecurity (06/10/2023)  Housing: Low Risk  (06/10/2023)  Transportation Needs: No Transportation Needs (06/10/2023)  Utilities: Not At Risk (06/10/2023)  Depression (PHQ2-9): Low Risk  (12/06/2022)  Financial Resource Strain: Low Risk  (11/06/2022)  Social Connections: Patient Unable To Answer (06/18/2023)  Stress: No Stress Concern Present (11/06/2022)  Tobacco Use: Medium Risk (06/09/2023)     Readmission Risk Interventions    11/21/2022   10:10 AM 11/18/2022    2:11 PM   Readmission Risk Prevention Plan  Post Dischage Appt  Complete  Medication Screening  Complete  Transportation Screening Complete Complete  HRI or Home Care Consult Complete   Social Work Consult for Recovery Care Planning/Counseling Complete   Palliative Care Screening Not Applicable   Medication Review [employer]) Complete

## 2023-06-24 NOTE — Progress Notes (Signed)
 Occupational Therapy Treatment/Discharge Patient Details Name: [name] MRN: [id number] DOB: [birth year] Today's Date: 06/24/2023   History of present illness Pt is a 68 year old woman admitted 12/23 from [facility], where she was receiving rehab, with hyponatremia and AKI associated with nausea and vomiting. PMH: cervical cancer, colovaginal fistula, multiple abdominal surgeries, recent nephrostomy due to hydronephrosis, CKD, anxiety, headaches, UTI.   OT comments  Pt sitting up in recliner upon therapy arrival with husband present in room. Pt verbalized frustration with initially being told that she was not discharging today. Pt agreed to participate in OT treatment session focusing on ADL re-training and functional mobility. Pt has met all acute OT goals this date and is requiring Supervision for BADL tasks and functional transfers/mobility. Discharge plan has been updated to [facility] services to continue working on increasing overall independence with daily tasks. No DME needs identified. OT will sign off. Plan is to discharge home with husband today.       If plan is discharge home, recommend the following:  A little help with bathing/dressing/bathroom;Assistance with cooking/housework;Assist for transportation   Equipment Recommendations  None recommended by OT       Precautions / Restrictions Precautions Precautions: Fall Precaution Comments: R ileostomy, R nephrostomy drain Restrictions Weight Bearing Restrictions Per Provider Order: No       Mobility Bed Mobility  General bed mobility comments: Sitting up in recliner upon therapy arrival    Transfers Overall transfer level: Needs assistance Equipment used: Rolling walker (2 wheels) Transfers: Sit to/from Stand, Bed to chair/wheelchair/BSC Sit to Stand: Supervision     Step pivot transfers: Supervision     General transfer comment: Pt able to verbalize if she needed to use RW or not during functional  transfers and mobility.     Balance Overall balance assessment: Mild deficits observed, not formally tested      ADL either performed or assessed with clinical judgement   ADL Overall ADL's : Needs assistance/impaired     Grooming: Wash/dry hands;Supervision/safety;Standing      Toilet Transfer: Supervision/safety;Regular Toilet;Grab bars;Rolling walker (2 wheels)   Toileting- Clothing Manipulation and Hygiene: Supervision/safety;Sitting/lateral lean         Cognition Arousal: Alert Behavior During Therapy: Impulsive (verbalized frustration with initially not going home today although at end of session, MD and case management arrived in room and let pt know that she was being discharged home today and pt was so happy.) Overall Cognitive Status: Impaired/Different from baseline Area of Impairment: Attention, Memory, Following commands, Safety/judgement, Awareness, Problem solving    Memory: Decreased short-term memory Following Commands: Follows one step commands consistently Safety/Judgement: Decreased awareness of safety, Decreased awareness of deficits     General Comments: Impulsive during session slightly. Was able to verbalize that she shouldn't stand up without OT being ready otherwise she would have alarms going off. Pt requested to have her ileostomy bag emptied during therapy session.              General Comments VSS on RA. Husband in room during session.    Pertinent Vitals/ Pain       Pain Assessment Pain Assessment: No/denies pain         Frequency  Min 1X/week        Progress Toward Goals  OT Goals(current goals can now be found in the care plan section)  Progress towards OT goals: Goals met/education completed, patient discharged from OT   Goals met.  AM-PAC OT 6 Clicks Daily Activity     Outcome Measure   Help from another person eating meals?: None Help from another person taking care of personal grooming?: None Help from  another person toileting, which includes using toliet, bedpan, or urinal?: A Little Help from another person bathing (including washing, rinsing, drying)?: A Little Help from another person to put on and taking off regular upper body clothing?: A Little Help from another person to put on and taking off regular lower body clothing?: A Little 6 Click Score: 20    End of Session Equipment Utilized During Treatment: Gait belt;Rolling walker (2 wheels)  OT Visit Diagnosis: Unsteadiness on feet (R26.81);Other abnormalities of gait and mobility (R26.89);Muscle weakness (generalized) (M62.81);Other symptoms and signs involving cognitive function   Activity Tolerance Patient tolerated treatment well   Patient Left in chair;with call bell/phone within reach;with chair alarm set;with family/visitor present           Time: 8655-8591 OT Time Calculation (min): 24 min  Charges: OT General Charges $OT Visit: 1 Visit OT Treatments $Self Care/Home Management : 8-22 mins $Therapeutic Activity: 8-22 mins  Leita Howell, OTR/L,CBIS  Supplemental OT - [facility] and [facility] Secure Chat Preferred    Aki Burdin, Leita BIRCH 06/24/2023, 2:16 PM

## 2023-06-24 NOTE — Discharge Summary (Signed)
 Physician Discharge Summary  [name] FMW:[id number] DOB: 1956/04/02 DOA: 06/09/2023  PCP: Patient, [name]  Admit date: 06/09/2023  Discharge date: 06/24/2023  Admitted From: Home  Disposition:  Home Health Services  Recommendations for Outpatient Follow-up:  Follow up with PCP in 1-2 weeks. Please obtain BMP/CBC in one week. Advised to follow-up with urology as scheduled  Home Health:Home Health Services Equipment/Devices: None  Discharge Condition: Good CODE STATUS:Full code Diet recommendation: Heart Healthy  Brief [facility] Course: This 68 y.o. female with medical history significant of cervical cancer and rectovaginal fistula s/p redo low anterior resection with colovaginal fistula takedown, fistula repair, and hand sewn end to end sigmoidrectal anastomosis with loop ileostomy on 11/23/18; closure of loop ileostomy 05/01/20, SBO s/p ex-lap with lysis of adhesions on 11/08/20, recurrent SBO s/p ex-lap, LOA, distal ileum and cecum resection with end ileostomy creation on 03/14/2023,  started on TPN, right hydronephrosis s/p R ureteral stent placement on 04/02/2023 with subsequent obstructive uropathy, UTI, AKI and bilateral hydronephrosis with placement of right nephrostomy tube on 05/08/2023 presented with nausea, vomiting and hyponatremia from SNF.  Patient has been on TPN recently after her recent hospitalization at [facility] and discharged to SNF.  On presentation, she was tachypneic with sodium of 121 (last sodium on 05/24/2023 in care everywhere was 130).  Creatinine was 2.07 (last creatinine was 1.09 on 05/24/2023) with bicarb of 18.  CT of abdomen and pelvis did not show SBO but showed moderate to marked severity right-sided hydronephrosis and proximal hydroureter with percutaneous right-sided nephrostomy tube and right sided intraureteral stent in place along with mild to moderate severity left-sided hydronephrosis and proximal hydroureter with  markedly dilated intrarenal calyces on the left.  She was started on IV fluids.  TPN was resumed.  She was started on IV antibiotics for possible UTI.  Urology was also consulted and recommended conservative management and outpatient follow-up.  PT recommending SNF placement.  TOC following.  During the hospitalization, she had elevated LFTs: GI was consulted, TPN held for few days but subsequently been resumed with slight improvement in LFTs.  GI has signed off.  Currently awaiting SNF placement.  Patient has made significant improvement, TPN discontinued,  Patient tolerated soft bland diet.  Patient wants to be discharged. home with services arranged.  Patient being discharged home  Discharge Diagnoses:  Principal Problem:   Hyponatremia Active Problems:   Acute-on-chronic kidney injury (HCC)   Hypothyroidism   Mixed hyperlipidemia   Failure to thrive in adult   Nausea & vomiting   Transaminitis   Metabolic acidosis with normal anion gap and bicarbonate losses   Bilateral hydronephrosis   Tremors of nervous system  Hyponatremia -Possibly due to decreased oral intake and likely GI loss. -Sodium 121 on presentation.  Sodium 137 this morning.  Off gentle hydration.   -Continue to encourage oral intake.  Off sertraline . -Sodium Improved to 131.   Acute kidney injury on chronic kidney disease stage IIIa Acute metabolic acidosis Possible UTI: Present on admission History of bilateral hydronephrosis status post ureteral stent and right nephrostomy tube -Urology evaluation by Dr. Gaston on 06/13/2023 appreciated: Recommended to continue nephrostomy tube for now and outpatient follow-up with regular urologist in [facility]. -UA suggestive of possible UTI.  Treated with 5 days of Rocephin  and subsequently discontinued.  Urine culture grew multiple species.   Hypokalemia: -Improved   Hypothyroidism: -Continue levothyroxine    Hyperlipidemia -Hold statin due to transaminitis.    Transaminitis -CT  imaging showed no biliary dilatation.  GI was consulted, TPN held for few days but subsequently been resumed with slight improvement in LFTs.  GI has signed off.  Outpatient follow-up with GI.  LFTs still elevated but stable.  Monitor intermittently.   Nausea and vomiting: -Has been an ongoing chronic issue for her due to her complex abdominal surgical history.   -No small bowel obstruction demonstrated on CT imaging on presentation.   -Continue as needed antiemetics.  Currently improving.  Outpatient follow-up with primary surgical team   Failure to thrive in adult: -Pt has extensive abdominal surgery due to hx of cervical cancer and currently on TPN  -Palliative care evaluation appreciated.  Patient remains full code. -Patient requesting for TPN to be discontinued.  48-hour calorie count ordered on 06/20/2023.  Follow dietitian's recommendations.  Encourage oral intake.  Will need outpatient follow-up with her general surgeon. -TPN discontinued.   History of cervical cancer and rectovaginal fistula status post surgical intervention: -Currently has an ileostomy.  Outpatient follow-up with oncology/GYN oncology/general surgery   Anxiety/depression -Continue mirtazapine  along with as needed Xanax .  Sertraline  discontinued   Physical deconditioning -Patient from SNF.  PT recommending SNF placement.  TOC following  Discharge Instructions  Discharge Instructions     Call MD for:  difficulty breathing, headache or visual disturbances   Complete by: As directed    Call MD for:  persistant dizziness or light-headedness   Complete by: As directed    Call MD for:  persistant nausea and vomiting   Complete by: As directed    Diet - low sodium heart healthy   Complete by: As directed    Diet Carb Modified   Complete by: As directed    Discharge instructions   Complete by: As directed    Advised to follow-up with primary care physician in 1 week. Advised to follow-up  with urology as scheduled.   Increase activity slowly   Complete by: As directed    No wound care   Complete by: As directed       Allergies as of 06/24/2023       Reactions   Barium-containing Compounds    hives   Percocet [oxycodone-acetaminophen ] Itching, Other (See Comments)   migraine         Medication List     STOP taking these medications    ciprofloxacin  500 MG tablet Commonly known as: CIPRO    meloxicam 7.5 MG tablet Commonly known as: MOBIC   simvastatin  20 MG tablet Commonly known as: ZOCOR        TAKE these medications    acetaminophen  500 MG tablet Commonly known as: TYLENOL  Take 1,000 mg by mouth every 6 (six) hours as needed.   cetirizine  10 MG tablet Commonly known as: ZYRTEC  Take 10 mg by mouth daily. PRN   CLINIMIX /DEXTROSE  (5/15) IV Inject 42 mLs into the vein See admin instructions. 42 ml per hour, intravenously every 24 hours   Culturelle [employer] Wellness Caps Take 1 capsule by mouth daily.   divalproex  125 MG DR tablet Commonly known as: DEPAKOTE  Take 125 mg by mouth at bedtime.   dronabinol 2.5 MG capsule Commonly known as: MARINOL Take 2.5 mg by mouth 2 (two) times daily.   fat emulsion 20 % (TPN/TNA) for anes documentation 20 % Emul Inject 21 mLs into the vein continuous. 21 ml per hour every 24 hour (250 ml over 12 hours), once lipid bag is completed, disconnect lipid line, flush picc line with 5-10 ml  saline flush.   fluticasone  50 MCG/ACT nasal spray Commonly known as: FLONASE  Place 1 spray into both nostrils daily.   hydrOXYzine  25 MG tablet Commonly known as: ATARAX  Take 25 mg by mouth every 8 (eight) hours as needed for anxiety.   levothyroxine  25 MCG tablet Commonly known as: SYNTHROID  Take 1 tablet (25 mcg total) by mouth daily.   mirtazapine  7.5 MG tablet Commonly known as: REMERON  Take 7.5 mg by mouth at bedtime.   mupirocin  ointment 2 % Commonly known as: BACTROBAN  1 Application 2 (two) times daily. To  red area around insertion site for 7 days   omeprazole  20 MG capsule Commonly known as: PRILOSEC Take 20 mg by mouth in the morning.   multivitamin tablet Take 1 tablet by mouth daily.   Oncovite Tabs Take 1 tablet by mouth daily.   ondansetron  4 MG tablet Commonly known as: ZOFRAN  Take 4 mg by mouth every 6 (six) hours as needed for nausea or vomiting. What changed: Another medication with the same name was removed. Continue taking this medication, and follow the directions you see here.   Oyster Shell Calcium  500 MG Tabs Take 1,250 mg by mouth daily.   polyethylene glycol powder 17 GM/SCOOP powder Commonly known as: GLYCOLAX /MIRALAX  Take 1 Container by mouth once. Give 1 scoop by mouth every 24 hours as needed for constipation   promethazine  12.5 MG tablet Commonly known as: PHENERGAN  Take 12.5 mg by mouth every 6 (six) hours as needed for nausea or vomiting.   sertraline  25 MG tablet Commonly known as: ZOLOFT  Take 25 mg by mouth daily.   sodium chloride  1 g tablet Take 1 g by mouth daily. For 3 days        Follow-up Information     Kriss Estefana DEL, DO. Call in 2 day(s).   Specialty: Gastroenterology Why: Call to schedule lab appointment to have blood work for liver enzyme testing. Inform office staff that Jessamyn was seen and evaluated by Dr. Kriss during recent inpatient stay. If blood work is completed through rehab facility, please ensure that lab results are sent to [facility] GI office for Dr. Kriss to review. Contact information: [address] [place] [address] [phone]         Lendia Boby CROME, NP-C. Go in 2 day(s).   Specialty: Family Medicine Why: The appointment is at 4:20 p.m. on Thursday, the 9th, to establish care. Follow up for home health orders Contact information: [address] [place] [address] [phone]         [facility] Follow up.   Why: Home Health agency will follow up with you after  discharge.               Allergies  Allergen Reactions   Barium-Containing Compounds     hives   Percocet [Oxycodone-Acetaminophen ] Itching and Other (See Comments)    migraine     Consultations: Urology Gastroenterology    Procedures/Studies: IR PATIENT EVAL TECH 0-60 MINS Result Date: 06/20/2023 Crawford Dorothyann POUR     06/23/2023 12:00 PM Presented to patient's room, to assess right nephrostomy tube, after removal of dressing, suture noticeably pulled back, bag has been draining appropriately all day, IR team attached a Stat Lock Stabilizing device.  Site was redressed, patient instructed to inform radiology if tube stops draining or excessive leaking around site.   See Rolin Geralds or Franky Dykes notes as well  CT ABDOMEN PELVIS WO CONTRAST Result Date: 06/09/2023 CLINICAL  DATA:  Nausea and vomiting. EXAM: CT ABDOMEN AND PELVIS WITHOUT CONTRAST TECHNIQUE: Multidetector CT imaging of the abdomen and pelvis was performed following the standard protocol without IV contrast. RADIATION DOSE REDUCTION: This exam was performed according to the departmental dose-optimization program which includes automated exposure control, adjustment of the mA and/or kV according to patient size and/or use of iterative reconstruction technique. COMPARISON:  April 28, 2023 FINDINGS: Lower chest: A 4 mm noncalcified lung nodule is seen within the anterolateral aspect of the right lower lobe (axial CT image 2, CT series 5). This is not clearly identified on the prior study. Hepatobiliary: No focal liver abnormality is seen. Status post cholecystectomy. No biliary dilatation. Pancreas: Unremarkable. No pancreatic ductal dilatation or surrounding inflammatory changes. Spleen: Normal in size without focal abnormality. Adrenals/Urinary Tract: The right adrenal gland is unremarkable. A stable 19 mm diameter low-attenuation (approximately 17.58 Hounsfield units) left adrenal mass is noted. The left kidney is  atrophic in appearance with diffuse renal cortical thinning noted. No focal renal lesions are identified. There is a percutaneous right-sided nephrostomy tube in place. A right-sided endo ureteral stent is also seen. There is moderate to marked severity right-sided hydronephrosis and proximal hydroureter. Mild to moderate severity left-sided hydronephrosis and proximal hydroureter is also seen with markedly dilated intrarenal calices on the left. A moderate amount of air is seen within the lumen of a markedly distended urinary bladder. Stomach/Bowel: Stomach is within normal limits. The appendix is not identified. Surgically anastomosed bowel is seen within the anterior aspect of the right lower quadrant. A right lower quadrant ostomy site is also seen. No evidence of bowel dilatation. Stable mild to moderate severity circumferential thickening of the distal sigmoid colon is noted. Vascular/Lymphatic: Aortic atherosclerosis. No enlarged abdominal or pelvic lymph nodes. Reproductive: Status post hysterectomy. No adnexal masses. Stable postoperative changes are seen within the posterior aspect of the pelvis. Other: There is a stable appearing right lower quadrant ostomy site. No abdominopelvic ascites. Musculoskeletal: There is stable, chronic presacral soft tissue thickening. Multilevel degenerative changes are noted throughout the lumbar spine. IMPRESSION: 1. Moderate to marked severity right-sided hydronephrosis and proximal hydroureter with a percutaneous right-sided nephrostomy tube and right-sided endo ureteral stent in place. 2. Mild to moderate severity left-sided hydronephrosis and proximal hydroureter with markedly dilated intrarenal calices on the left. 3. Stable postoperative changes within the posterior aspect of the pelvis. 4. Stable left adrenal adenoma. No follow-up imaging is recommended. 5. 4 mm noncalcified right lower lobe lung nodule. Correlation with follow-up nonemergent dedicated chest CT is  recommended. 6. Aortic atherosclerosis. Aortic Atherosclerosis (ICD10-I70.0). Electronically Signed   By: Suzen Dials M.D.   On: 06/09/2023 22:40   DG Abdomen Acute W/Chest Result Date: 06/09/2023 CLINICAL DATA:  Nausea and vomiting. EXAM: DG ABDOMEN ACUTE WITH 1 VIEW CHEST COMPARISON:  April 28, 2023 FINDINGS: The right-sided venous catheter seen on the prior study has been removed and has been replaced with a left-sided venous catheter. Its distal tip is seen at the junction of the superior vena cava and right atrium. There is no evidence of dilated bowel loops or free intraperitoneal air. A percutaneous nephrostomy tube is suspected on the right. A right-sided endo ureteral stent is also seen. No radiopaque calculi or other significant radiographic abnormality is seen. Heart size and mediastinal contours are within normal limits. Mild to moderate severity calcification of the aortic arch is seen. Both lungs are clear. IMPRESSION: 1. No acute cardiopulmonary disease. 2. Nonobstructive bowel gas pattern. 3. Right-sided  percutaneous nephrostomy tube and right-sided endo ureteral stent. Electronically Signed   By: Suzen Dials M.D.   On: 06/09/2023 22:25     Subjective: Patient was seen and examined at bedside.  Overnight events noted.   Patient reporting much better , tolerating diet, has ambulated in the hallway.  Home health services arranged, She wants to be discharged home.  Discharge Exam: Vitals:   06/24/23 0659 06/24/23 1424  BP: (!) 90/58 96/68  Pulse: 79 87  Resp:  16  Temp:  97.7 F (36.5 C)  SpO2:  100%   Vitals:   06/24/23 0500 06/24/23 0528 06/24/23 0659 06/24/23 1424  BP:  (!) 81/59 (!) 90/58 96/68  Pulse:  80 79 87  Resp:  20  16  Temp:  97.7 F (36.5 C)  97.7 F (36.5 C)  TempSrc:  Oral  Oral  SpO2:  100%  100%  Weight: 49 kg     Height:        General: Pt is alert, awake, not in acute distress Cardiovascular: RRR, S1/S2 +, no rubs, no  gallops Respiratory: CTA bilaterally, no wheezing, no rhonchi Abdominal: Soft, NT, ND, bowel sounds + Extremities: no edema, no cyanosis    The results of significant diagnostics from this hospitalization (including imaging, microbiology, ancillary and laboratory) are listed below for reference.     Microbiology: No results found for this or any previous visit (from the past 240 hours).   Labs: BNP (last 3 results) Recent Labs    04/28/23 2330  BNP 76.0   Basic Metabolic Panel: Recent Labs  Lab 06/20/23 0412 06/21/23 0456 06/22/23 0442 06/23/23 0333 06/24/23 0126  NA 132* 133* 134* 137 133*  K 3.1* 4.0 4.2 4.0 3.9  CL 100 97* 96* 100 97*  CO2 24 28 29 28 27   GLUCOSE 108* 133* 108* 70 70  BUN 63* 67* 62* 65* 59*  CREATININE 0.92 0.95 0.93 1.20* 1.22*  CALCIUM  9.2 9.7 9.6 9.4 9.1  MG 1.8 2.1 2.1 2.1 2.1  PHOS 3.2 3.9 4.3 3.6 4.2   Liver Function Tests: Recent Labs  Lab 06/18/23 0432 06/19/23 0419 06/20/23 0412 06/22/23 0442 06/23/23 0333  AST 121* 116* 146* 146* 115*  ALT 343* 288* 328* 356* 317*  ALKPHOS 481* 441* 430* 492* 458*  BILITOT 0.3 0.4 0.4 0.7 0.7  PROT 6.3* 6.2* 5.9* 6.3* 6.0*  ALBUMIN  2.8* 2.8* 2.7* 2.9* 2.7*   No results for input(s): LIPASE, AMYLASE in the last 168 hours. No results for input(s): AMMONIA in the last 168 hours. CBC: Recent Labs  Lab 06/19/23 0419 06/20/23 0412 06/22/23 0442 06/23/23 0333 06/24/23 0126  WBC 4.7 4.2 4.9 4.0 3.7*  NEUTROABS  --   --  2.9 2.2  --   HGB 10.0* 9.2* 9.6* 9.1* 9.0*  HCT 30.7* 27.6* 29.4* 27.6* 28.3*  MCV 91.9 91.4 92.5 93.2 94.3  PLT 216 167 190 192 182   Cardiac Enzymes: No results for input(s): CKTOTAL, CKMB, CKMBINDEX, TROPONINI in the last 168 hours. BNP: Invalid input(s): POCBNP CBG: Recent Labs  Lab 06/22/23 1119 06/22/23 1630 06/22/23 2354 06/23/23 0554 06/23/23 1127  GLUCAP 219* 187* 272* 169* 201*   D-Dimer No results for input(s): DDIMER in the last  72 hours. Hgb A1c No results for input(s): HGBA1C in the last 72 hours. Lipid Profile Recent Labs    06/23/23 0333  TRIG 295*   Thyroid  function studies No results for input(s): TSH, T4TOTAL, T3FREE, THYROIDAB in the last 72 hours.  Invalid input(s): FREET3 Anemia work up No results for input(s): VITAMINB12, FOLATE, FERRITIN, TIBC, IRON, RETICCTPCT in the last 72 hours. Urinalysis    Component Value Date/Time   COLORURINE YELLOW 06/10/2023 1800   APPEARANCEUR HAZY (A) 06/10/2023 1800   APPEARANCEUR Clear 10/16/2022 0813   LABSPEC 1.016 06/10/2023 1800   PHURINE 6.0 06/10/2023 1800   GLUCOSEU NEGATIVE 06/10/2023 1800   HGBUR MODERATE (A) 06/10/2023 1800   BILIRUBINUR NEGATIVE 06/10/2023 1800   BILIRUBINUR Negative 10/16/2022 0813   KETONESUR NEGATIVE 06/10/2023 1800   PROTEINUR 30 (A) 06/10/2023 1800   UROBILINOGEN 0.2 10/11/2022 0848   UROBILINOGEN 0.2 03/04/2014 1118   NITRITE POSITIVE (A) 06/10/2023 1800   LEUKOCYTESUR LARGE (A) 06/10/2023 1800   Sepsis Labs Recent Labs  Lab 06/20/23 0412 06/22/23 0442 06/23/23 0333 06/24/23 0126  WBC 4.2 4.9 4.0 3.7*   Microbiology No results found for this or any previous visit (from the past 240 hours).   Time coordinating discharge: Over 30 minutes  SIGNED:   Darcel Dawley, MD  [facility] 06/24/2023, 3:57 PM Pager   If 7PM-7AM, please contact night-coverage

## 2023-06-25 ENCOUNTER — Ambulatory Visit: Payer: Medicare Other | Admitting: Nurse Practitioner

## 2023-06-25 ENCOUNTER — Telehealth: Payer: Self-pay | Admitting: *Deleted

## 2023-06-25 NOTE — Transitions of Care (Post Inpatient/ED Visit) (Signed)
 06/25/2023  Name: [name] MRN: [id number] DOB: 12/18/1955  Today's TOC FU Call Status: Today's TOC FU Call Status:: Successful TOC FU Call Completed TOC FU Call Complete Date: 06/25/23 Patient's Name and Date of Birth confirmed.  Transition Care Management Follow-up Telephone Call Date of Discharge: 06/24/23 Discharge Facility: [facility]) Type of Discharge: Inpatient Admission Primary Inpatient Discharge Diagnosis:: N/V, hyponatremia- admitted from SNF (rehab) How have you been since you were released from the hospital?: Better (per Charles/ spouse/ caregiver: Overall things going fine; she is feeling good and getting around the house fine, no cane, no walker.  We are going to see the new primary care doctor at [facility].  Her medicines need to be straightened out) Any questions or concerns?: Yes Patient Questions/Concerns:: Multiple medications are currently listed on her medication list that patient and caregiver are unsure about/ report she is not taking-- they have much confusion around what patient should/ should not be taking Patient Questions/Concerns Addressed: Notified Provider of Patient Questions/Concerns, Other: (Full medication review/ reconciliation completed with updating in EHR: advised caregiver to complete thorough medication review at home prior to new patient OV 06/26/23; as well as updating of med list with new PCP during appointment tomorrow)  Items Reviewed: Did you receive and understand the discharge instructions provided?: Yes (thoroughly reviewed with patient/ caregiver-husband who verbalizes good understanding of same-- but have many questions) Medications obtained,verified, and reconciled?: Yes (Medications Reviewed) Any new allergies since your discharge?: No Dietary orders reviewed?: Yes Type of Diet Ordered:: Regular Do you have support at home?: Yes People in Home: spouse Name of Support/Comfort Primary Source: Reports  essentially independent in self-care activities; supportive spouse assists as/ if needed/ indicated  Medications Reviewed Today: Medications Reviewed Today     Reviewed by Hermina Barnard M, RN (Registered Nurse) on 06/25/23 at 1313  Med List Status: <None>   Medication Order Taking? Sig Documenting Provider Last Dose Status Informant  acetaminophen  (TYLENOL ) 500 MG tablet [id number] Yes Take 1,000 mg by mouth every 6 (six) hours as needed. [provider] Taking Active Nursing Home Medication Administration Guide (MAG), Pharmacy Records  Amino Acid Infusion in D15W (CLINIMIX /DEXTROSE , 5/15, IV) [id number] No Inject 42 mLs into the vein See admin instructions. 42 ml per hour, intravenously every 24 hours  Patient not taking: Reported on 06/25/2023   [provider] Not Taking Active Pharmacy Records, Nursing Home Medication Administration Guide (MAG)           Med Note (Jance Siek M   Wed Jun 25, 2023 11:57 AM) 06/25/23: Reports during TOC call-- no longer taking- discontinued at time of hospital discharge on 06/24/23- PICC line discontinued 06/24/23  cetirizine  (ZYRTEC ) 10 MG tablet [id number] Yes Take 10 mg by mouth daily. PRN [provider] Taking Active Pharmacy Records, Nursing Home Medication Administration Guide (MAG)  divalproex  (DEPAKOTE ) 125 MG DR tablet [id number] No Take 125 mg by mouth at bedtime. [provider] Unknown Active Nursing Home Medication Administration Guide [facility]), Pharmacy Records           Med Note (Ernesto Zukowski M   Wed Jun 25, 2023 12:00 PM) 06/25/23: Reports during [facility] call- husband states he is unsure if patient should continue taking- plans to ask new PCP during HFU office visit on 06/26/23  dronabinol (MARINOL) 2.5 MG capsule [id number] No Take 2.5 mg by mouth 2 (two) times daily.  Patient not taking: Reported on 06/25/2023   [provider] Not Taking Active Pharmacy  Records, Nursing Home Medication Administration Guide (MAG)            Med Note (Lenee Franze M   Wed Jun 25, 2023 12:02 PM) 06/25/23: Reports during TOC call- husband states he is unsure if patient should continue taking- he is not sure if patient even has the medication at home; plans to ask new PCP during HFU office visit on 06/26/23   Fat Emulsion Plant Based (FAT EMULSION 20 %, TPN/TNA, FOR ANES DOCUMENTATION) 20 % EMUL [id number] No Inject 21 mLs into the vein continuous. 21 ml per hour every 24 hour (250 ml over 12 hours), once lipid bag is completed, disconnect lipid line, flush picc line with 5-10 ml saline flush.  Patient not taking: Reported on 06/25/2023   [provider] Not Taking Active Pharmacy Records, Nursing Home Medication Administration Guide (MAG)           Med Note (Jyaire Koudelka M   Wed Jun 25, 2023 12:02 PM) 06/25/23: Reports during TOC call-- no longer taking- discontinued at time of hospital discharge on 06/24/23- PICC line discontinued 06/24/23   fluticasone  (FLONASE ) 50 MCG/ACT nasal spray [id number] Yes Place 1 spray into both nostrils daily. [provider] Taking Active Pharmacy Records, Nursing Home Medication Administration Guide (MAG)           Med Note (Viral Schramm M   Wed Jun 25, 2023 12:03 PM) 06/25/23: Reports during TOC call-- uses prn only   hydrOXYzine  (ATARAX ) 25 MG tablet [id number] No Take 25 mg by mouth every 8 (eight) hours as needed for anxiety. [provider] Unknown Active Pharmacy Records, Nursing Home Medication Administration Guide (MAG)           Med Note (Sadrac Zeoli M   Wed Jun 25, 2023 12:05 PM) 06/25/23: Reports during [facility] call-- husband unsure if patient has at home/ unsure if patient needs to take- plans to discuss with new PCP during HFU office visit on 06/26/23   Lactobacillus Rhamnosus, GG, (CULTURELLE HEALTH & WELLNESS) CAPS [id number] Yes Take 1 capsule by mouth daily. [provider] Taking Active Pharmacy Records, Nursing Home Medication Administration Guide  (MAG)  levothyroxine  (SYNTHROID ) 25 MCG tablet [id number] Yes Take 1 tablet (25 mcg total) by mouth daily. Jolinda Norene HERO, DO Taking Active Nursing Home Medication Administration Guide (MAG), Pharmacy Records  mirtazapine  (REMERON ) 7.5 MG tablet [id number] No Take 7.5 mg by mouth at bedtime. [provider] Unknown Active Nursing Home Medication Administration Guide [facility]), Pharmacy Records           Med Note (Jonatha Gagen M   Wed Jun 25, 2023 12:06 PM) 06/25/23: Reports during [facility] call-- husband unsure if patient has at home/ unsure if patient needs to take- plans to discuss with new PCP during HFU office visit on 06/26/23    Multiple Vitamins-Minerals (MULTIVITAMIN) tablet [id number] Yes Take 1 tablet by mouth daily. [provider] Taking Active Pharmacy Records, Nursing Home Medication Administration Guide (MAG)  Multiple Vitamins-Minerals (ONCOVITE) TABS [id number] No Take 1 tablet by mouth daily. [provider] Unknown Active Pharmacy Records, Nursing Home Medication Administration Guide (MAG)           Med Note (Dequavius Kuhner M   Wed Jun 25, 2023 12:10 PM) 06/25/23: Husband reports during [facility] call, I think she has it here but she only takes it as needed; reports he will discuss with new PCP during office visit on 06/26/23   mupirocin  ointment (BACTROBAN ) 2 % [id number] Yes 1  Application 2 (two) times daily. To red area around insertion site for 7 days [provider] Taking Active Pharmacy Records, Nursing Home Medication Administration Guide (MAG)           Med Note (Malakie Balis M   Wed Jun 25, 2023 12:08 PM) 06/25/23: Reports during TOC call, uses prn for drain tube in her back that her [place] doctor put in  omeprazole  (PRILOSEC) 20 MG capsule [id number] No Take 20 mg by mouth in the morning. [provider] Unknown Active Pharmacy Records, Nursing Home Medication Administration Guide (MAG)           Med Note (Dashea Mcmullan M   Wed Jun 25, 2023 12:09 PM) 06/25/23: Husband reports during [facility] call, I think she has it here but she only takes it as needed; reports he will discuss with new PCP during office visit on 06/26/23  ondansetron  (ZOFRAN ) 4 MG tablet [id number] No Take 4 mg by mouth every 6 (six) hours as needed for nausea or vomiting.  Patient not taking: Reported on 06/25/2023   [provider] Not Taking Active Pharmacy Records, Nursing Home Medication Administration Guide (MAG)           Med Note (Penney Domanski M   Wed Jun 25, 2023 12:11 PM) 06/25/23: Husband reports during [facility] call, no longer taking and has none at home   [facility] Calcium  500 MG TABS [id number] No Take 1,250 mg by mouth daily. [provider] Unknown Active Nursing Home Medication Administration Guide [facility]), Pharmacy Records           Med Note (Woods Gangemi M   Wed Jun 25, 2023 12:12 PM) 06/25/23: Reports during [facility] call-- husband unsure if patient has at home/ unsure if patient needs to take- plans to discuss with new PCP during HFU office visit on 06/26/23   polyethylene glycol powder (GLYCOLAX /MIRALAX ) 17 GM/SCOOP powder [id number] Yes Take 1 Container by mouth once. Give 1 scoop by mouth every 24 hours as needed for constipation [provider] Taking Active Pharmacy Records, Nursing Home Medication Administration Guide (MAG)  promethazine  (PHENERGAN ) 12.5 MG tablet [id number] No Take 12.5 mg by mouth every 6 (six) hours as needed for nausea or vomiting.  Patient not taking: Reported on 06/25/2023   [provider] Not Taking Active Nursing Home Medication Administration Guide [facility]), Pharmacy Records           Med Note (Kassidi Elza M   Wed Jun 25, 2023 12:13 PM) 06/25/23: Husband reports during [facility] call, no longer taking and has none at home  sertraline  (ZOLOFT ) 25 MG tablet [id number] No Take 25 mg by mouth daily.  Patient not taking: Reported on 06/25/2023   [provider] Not Taking Active Pharmacy Records,  Nursing Home Medication Administration Guide (MAG)           Med Note (Bernarr Longsworth M   Wed Jun 25, 2023 12:15 PM) 06/25/23: Husband reports during [facility] call, no longer taking and unsure if has any at home- patient verbally reports she is not taking- needs clarification around whether she should be taking- to discuss with PCP at new patient HFU office visit on 06/26/23  sodium chloride  1 g tablet [id number] No Take 1 g by mouth daily. For 3 days  Patient not taking: Reported on 06/25/2023   [provider] Not Taking Active Pharmacy Records, Nursing Home Medication Administration Guide (MAG)           Med Note (  Maren Wiesen M   Wed Jun 25, 2023 12:16 PM) 06/25/23: Husband reports during [facility] call, no longer taking unsure if has any at home           Home Care and Equipment/Supplies: Were Home Health Services Ordered?: Yes Name of Home Health Agency:: [facility]- RN: however- this needs to be verified during new patient appointment with new PCP on 06/26/23: no PCP until established with PCP during 06/26/23 office visit Has Agency set up a time to come to your home?: No ([facility]- RN: however- this needs to be verified during new patient appointment with new PCP on 06/26/23: no PCP until established with PCP during 06/26/23 office visit) EMR reviewed for Home Health Orders: Orders present/patient has not received call (refer to CM for follow-up) (Successfully enrolled into 30-day TOC program with scheduled follow up call 07/02/23) Any new equipment or medical supplies ordered?: No  Functional Questionnaire: Do you need assistance with bathing/showering or dressing?: No (husband assists/ supervises as/ if indicated) Do you need assistance with meal preparation?: No Do you need assistance with eating?: No Do you have difficulty maintaining continence: No Do you need assistance with getting out of bed/getting out of a chair/moving?: No Do you have difficulty managing or taking your  medications?: Yes (husband assists/ supervises as/ if indicated)  Follow up appointments reviewed: PCP Follow-up appointment confirmed?: Yes Date of PCP follow-up appointment?: 06/26/23 Follow-up Provider: New patient appointment/ HFU with Boby Mackintosh at [facility] Follow-up appointment confirmed?: Yes Date of Specialist follow-up appointment?: 07/03/23 Follow-Up Specialty Provider:: Urology at [facility] on 07/03/23; surgical provider at [facility] on 07/18/23 Do you need transportation to your follow-up appointment?: No Do you understand care options if your condition(s) worsen?: Yes-patient verbalized understanding  SDOH Interventions Today    Flowsheet Row Most Recent Value  SDOH Interventions   Food Insecurity Interventions Intervention Not Indicated  Housing Interventions Intervention Not Indicated  Transportation Interventions Intervention Not Indicated  [reports patient used to drive self before she got sick,  husband provides all of transpportation over last few months]  Utilities Interventions Intervention Not Indicated       Goals Addressed             This Visit's Progress    TOC 30-day Program Care Plan   On track    Current Barriers:  Medication management patient and caregiver verbalize significant confusion around what meds patient has at home- and what she should be taking-- has not had PCP care since June 2024 Provider appointments was dismissed from former PCP ([facility]) on 12/06/2022; has not had primary care since then; has new patient appointment arranged for tomorrow 06/26/23 with Boby Mackintosh, NP for establishment of care Home Health services pending through [facility] for RN:  this needs to be verified/ confirmed by new PCP 06/26/23 before services can be initiated  Multiple recent ED visits/ inpatient hospital admissions/ SNF rehab visits over last 12 months: 6 ED visits with discharge home; 7 inpatient hospital  admissions   RNCM Clinical Goal(s):  Patient will work with the Care Management team over the next 30 days to address Transition of Care Barriers: Medication Management Provider appointments Home Health services take all medications exactly as prescribed and will call provider for medication related questions as evidenced by review of medications/ patient caregiver reporting/ provider collaboration as indicated during [facility] 30-day program RN CM weekly outreaches attend all scheduled medical appointments: 06/26/23-- new patient PCP appointment for HFU/ establishment of care;  07/03/23- [facility] urology provider; 07/18/23- [facility] surgical provider as evidenced by review of same in EHR/ patient-caregiver reporting during [facility] 30-day program RN CM weekly outreaches not experience hospital admission as evidenced by review of EMR. Hospital Admissions in last 6 months = 7 inpatient admissions over last 12 months  through collaboration with RN Care manager, provider, and care team.   Interventions: Evaluation of current treatment plan related to  self management and patient's adherence to plan as established by provider  Transitions of Care:  New goal. 06/25/23 Durable Medical Equipment (DME) needs assessed with patient/caregiver Doctor Visits  - discussed the importance of doctor visits Communication with PCP scheduled for establishment of care on 06/26/23 re: confusion around medications/ need for verification of established primary care services prior to home health services initiation post-hospital discharge on 06/24/23 Full medication review with updating medication list in EHR per patient report  Confirmed not currently requiring/ using assistive devices for ambulation  Role of home health services with importance of participation/ ongoing engagement: explained/ discussed with patient and spouse that home health agency will likely need verification around new PCP establishment on 06/26/23  before services can be initiated: encouraged them to discuss with new PCP during tomorrow's office visit Provided education around benefit of conservative post-hospital discharge activity; need to pace activity without over-doing  Purpose of/ importance of having updated DPR completed-- provided education around process of updating DPR and encouraged patient to update at time of upcoming PCP appointment on 06/26/23 Provided my direct contact information should questions/ concerns/ needs arise post-TOC call, prior to next RN CM telephone visit    Patient Goals/Self-Care Activities: Participate in Transition of Care Program/Attend TOC scheduled calls Take all medications as prescribed Attend all scheduled provider appointments Perform all self care activities independently  Call provider office for new concerns or questions  Please take a list of all your medications from home to the new patient appointment with your new PCP on 06/26/23-- the medication list needs to updated during this visit- please talk to your new PCP about any questions you have around your medications Continue pacing activity as your recuperation from recent surgery continues Please work with the home health team once they are active/ involved in your care  Follow Up Plan:  Telephone follow up appointment with care management team member scheduled for:  Wednesday 07/02/23 at 2:30 pm         Beatris Blinda Lawrence, RN, BSN, Media Planner  Transitions of Care  [facility] - [facility] [phone]: direct office

## 2023-06-25 NOTE — Patient Instructions (Signed)
 Visit Information  Thank you for taking time to visit with me today. Please don't hesitate to contact me if I can be of assistance to you before our next scheduled telephone appointment.  Telephone follow up appointment with care management team member scheduled for:  Wednesday 07/02/23 at 2:30 pm   Please call the care guide team at [phone] if you need to cancel or reschedule your appointment.   Patient Goals/Self-Care Activities: Participate in Transition of Care Program/Attend TOC scheduled calls Take all medications as prescribed Attend all scheduled provider appointments Perform all self care activities independently  Call provider office for new concerns or questions  Please take a list of all your medications from home to the new patient appointment with your new PCP on 06/26/23-- the medication list needs to updated during this visit- please talk to your new PCP about any questions you have around your medications Continue pacing activity as your recuperation from recent surgery continues Please work with the home health team once they are active/ involved in your care  Following is a copy of your care plan:   Goals Addressed             This Visit's Progress    TOC 30-day Program Care Plan   On track    Current Barriers:  Medication management patient and caregiver verbalize significant confusion around what meds patient has at home- and what she should be taking-- has not had PCP care since June 2024 Provider appointments was dismissed from former PCP ([facility]) on 12/06/2022; has not had primary care since then; has new patient appointment arranged for tomorrow 06/26/23 with Boby Mackintosh, NP for establishment of care Home Health services pending through [facility] for RN:  this needs to be verified/ confirmed by new PCP 06/26/23 before services can be initiated  Multiple recent ED visits/ inpatient hospital admissions/ SNF rehab visits over last 12 months: 6 ED  visits with discharge home; 7 inpatient hospital admissions   RNCM Clinical Goal(s):  Patient will work with the Care Management team over the next 30 days to address Transition of Care Barriers: Medication Management Provider appointments Home Health services take all medications exactly as prescribed and will call provider for medication related questions as evidenced by review of medications/ patient caregiver reporting/ provider collaboration as indicated during [facility] 30-day program RN CM weekly outreaches attend all scheduled medical appointments: 06/26/23-- new patient PCP appointment for HFU/ establishment of care; 07/03/23- [facility] urology provider; 07/18/23- [facility] surgical provider as evidenced by review of same in EHR/ patient-caregiver reporting during [facility] 30-day program RN CM weekly outreaches not experience hospital admission as evidenced by review of EMR. Hospital Admissions in last 6 months = 7 inpatient admissions over last 12 months  through collaboration with RN Care manager, provider, and care team.   Interventions: Evaluation of current treatment plan related to  self management and patient's adherence to plan as established by provider  Transitions of Care:  New goal. 06/25/23 Durable Medical Equipment (DME) needs assessed with patient/caregiver Doctor Visits  - discussed the importance of doctor visits Communication with PCP scheduled for establishment of care on 06/26/23 re: confusion around medications/ need for verification of established primary care services prior to home health services initiation post-hospital discharge on 06/24/23 Full medication review with updating medication list in EHR per patient report  Confirmed not currently requiring/ using assistive devices for ambulation  Role of home health services with importance of participation/ ongoing engagement: explained/ discussed with  patient and spouse that home health agency will likely need  verification around new PCP establishment on 06/26/23 before services can be initiated: encouraged them to discuss with new PCP during tomorrow's office visit Provided education around benefit of conservative post-hospital discharge activity; need to pace activity without over-doing  Purpose of/ importance of having updated DPR completed-- provided education around process of updating DPR and encouraged patient to update at time of upcoming PCP appointment on 06/26/23 Provided my direct contact information should questions/ concerns/ needs arise post-TOC call, prior to next RN CM telephone visit    Patient Goals/Self-Care Activities: Participate in Transition of Care Program/Attend TOC scheduled calls Take all medications as prescribed Attend all scheduled provider appointments Perform all self care activities independently  Call provider office for new concerns or questions  Please take a list of all your medications from home to the new patient appointment with your new PCP on 06/26/23-- the medication list needs to updated during this visit- please talk to your new PCP about any questions you have around your medications Continue pacing activity as your recuperation from recent surgery continues Please work with the home health team once they are active/ involved in your care  Follow Up Plan:  Telephone follow up appointment with care management team member scheduled for:  Wednesday 07/02/23 at 2:30 pm          The patient verbalized understanding of instructions, educational materials, and care plan provided today and DECLINED offer to receive copy of patient instructions, educational materials, and care plan.   Telephone follow up appointment with care management team member scheduled for:  If you are experiencing a Mental Health or Behavioral Health Crisis or need someone to talk to, please  call the Suicide and Crisis Lifeline: 988 call the [place]  National Suicide Prevention Lifeline:  (832)148-4340 or TTY: (678)422-9601 TTY 450-645-7638) to talk to a trained counselor call 1-800-273-TALK (toll free, 24 hour hotline) go to [facility] [address] [place] [phone]) call the [facility] Crisis Line: (646)063-9772 call 911   Beatris Blinda Lawrence, RN, BSN, CCRN Alumnus RN Care Manager  Transitions of Care  [facility]BCI - Population Health  [place] [phone]: direct office

## 2023-06-26 ENCOUNTER — Ambulatory Visit: Payer: Medicare Other | Admitting: Family Medicine

## 2023-06-26 ENCOUNTER — Telehealth: Payer: Self-pay | Admitting: *Deleted

## 2023-06-26 NOTE — Patient Outreach (Addendum)
 Care Coordination   TOC 30-day program RN CM care coordination outreach   Visit Note   06/26/2023 Name: [name] MRN: [id number] DOB: 1956-01-17  [name] is a 68 y.o. year old female who sees Patient, [name] for primary care. I spoke with patient's spouse [name], verified on [facility] DPR re:  [name] by phone today.  06/26/23: Care Coordination outreach completed with patient's spouse [name] on [facility] DPR: patient's scheduled HFU office visit being cancelled received off-hour voice messages from patient's spouse x 2, this morning: he tells me that patient's scheduled hospital follow up visit with PCP provider which was scheduled during her recent hospital visit was cancelled by provider group at the end of the day yesterday.  I contacted [facility] care management leadership to inform/ update- confirmed they will collaborate with provider practice to ensure prompt HFU office visit is scheduled 10:03 am- contacted patient's spouse to provide update: left voice message 4:15 pm- re-attempted call and successfully connected with patient's spouse: he reports he spoke with Clotilda yesterday, who abruptly cancelled the appointment for hospital follow up visit with provider at practice; he stated he has not yet heard from anyone to re-schedule; since then, he has explored other options to obtain primary care, including other health systems-- he reports he is waiting to see if he gets any follow up/ return call from [facility] at practice before he decides course of action He and I agreed to at least one more scheduled outreach call as previously scheduled for next week on 07/02/23: we will go from there depending on whether or not he hears back from [facility] to re-schedule   Ensured that patient's spouse/ caregiver has my direct contact number for any additional care coordination needs/ questions in the interim    Goals Addressed             This Visit's Progress    TOC 30-day Program Care  Plan   On track    Current Barriers:  Medication management patient and caregiver verbalize significant confusion around what meds patient has at home- and what she should be taking-- has not had PCP care since June 2024 Provider appointments was dismissed from former PCP ([facility]) on 12/06/2022; has not had primary care since then; has new patient appointment arranged for tomorrow 06/26/23 with Boby Mackintosh, NP for establishment of care Home Health services pending through [facility] for RN:  this needs to be verified/ confirmed by new PCP 06/26/23 before services can be initiated  Multiple recent ED visits/ inpatient hospital admissions/ SNF rehab visits over last 12 months: 6 ED visits with discharge home; 7 inpatient hospital admissions   RNCM Clinical Goal(s):  Patient will work with the Care Management team over the next 30 days to address Transition of Care Barriers: Medication Management Provider appointments Home Health services take all medications exactly as prescribed and will call provider for medication related questions as evidenced by review of medications/ patient caregiver reporting/ provider collaboration as indicated during [facility] 30-day program RN CM weekly outreaches attend all scheduled medical appointments: 06/26/23-- new patient PCP appointment for HFU/ establishment of care; 07/03/23- [facility] urology provider; 07/18/23- [facility] surgical provider as evidenced by review of same in EHR/ patient-caregiver reporting during [facility] 30-day program RN CM weekly outreaches not experience hospital admission as evidenced by review of EMR. Hospital Admissions in last 6 months = 7 inpatient admissions over last 12 months  through collaboration with RN Care manager, provider, and  care team.   Interventions: Evaluation of current treatment plan related to  self management and patient's adherence to plan as established by provider  06/26/23: Care Coordination outreach  completed with patient's spouse [name] on [facility] DPR: please see care coordination note dated 06/26/23 for details re: patient's scheduled HFU office visit being cancelled by provider  Transitions of Care:  New goal. 06/25/23 Durable Medical Equipment (DME) needs assessed with patient/caregiver Doctor Visits  - discussed the importance of doctor visits Communication with PCP scheduled for establishment of care on 06/26/23 re: confusion around medications/ need for verification of established primary care services prior to home health services initiation post-hospital discharge on 06/24/23 Full medication review with updating medication list in EHR per patient report  Confirmed not currently requiring/ using assistive devices for ambulation  Role of home health services with importance of participation/ ongoing engagement: explained/ discussed with patient and spouse that home health agency will likely need verification around new PCP establishment on 06/26/23 before services can be initiated: encouraged them to discuss with new PCP during tomorrow's office visit Provided education around benefit of conservative post-hospital discharge activity; need to pace activity without over-doing  Purpose of/ importance of having updated DPR completed-- provided education around process of updating DPR and encouraged patient to update at time of upcoming PCP appointment on 06/26/23 Provided my direct contact information should questions/ concerns/ needs arise post-TOC call, prior to next RN CM telephone visit    Patient Goals/Self-Care Activities: Participate in Transition of Care Program/Attend TOC scheduled calls Take all medications as prescribed Attend all scheduled provider appointments Perform all self care activities independently  Call provider office for new concerns or questions  Please take a list of all your medications from home to the new patient appointment with your new PCP on 06/26/23-- the  medication list needs to updated during this visit- please talk to your new PCP about any questions you have around your medications Continue pacing activity as your recuperation from recent surgery continues Please work with the home health team once they are active/ involved in your care  Follow Up Plan:  Telephone follow up appointment with care management team member scheduled for:  Wednesday 07/02/23 at 2:30 pm          SDOH assessments and interventions completed:  No Not indicated- previously assessed during initial TOC call yesterday   Care Coordination Interventions:  Yes, provided   Follow up plan: Follow up call scheduled for 07/02/23    Encounter Outcome:  Patient Visit Completed   Adrine Hayworth Mckinney Kelwin Gibler, RN, BSN, CCRN Alumnus RN Care Manager  Transitions of Care  [facility] - [facility] [phone]: direct office

## 2023-07-01 DIAGNOSIS — Z515 Encounter for palliative care: Secondary | ICD-10-CM | POA: Diagnosis not present

## 2023-07-01 DIAGNOSIS — Z681 Body mass index (BMI) 19 or less, adult: Secondary | ICD-10-CM | POA: Diagnosis not present

## 2023-07-01 DIAGNOSIS — M542 Cervicalgia: Secondary | ICD-10-CM | POA: Diagnosis not present

## 2023-07-01 DIAGNOSIS — N261 Atrophy of kidney (terminal): Secondary | ICD-10-CM | POA: Diagnosis not present

## 2023-07-01 DIAGNOSIS — R571 Hypovolemic shock: Secondary | ICD-10-CM | POA: Diagnosis not present

## 2023-07-01 DIAGNOSIS — R519 Headache, unspecified: Secondary | ICD-10-CM | POA: Diagnosis not present

## 2023-07-01 DIAGNOSIS — Z932 Ileostomy status: Secondary | ICD-10-CM | POA: Diagnosis not present

## 2023-07-01 DIAGNOSIS — I959 Hypotension, unspecified: Secondary | ICD-10-CM | POA: Diagnosis not present

## 2023-07-01 DIAGNOSIS — Z466 Encounter for fitting and adjustment of urinary device: Secondary | ICD-10-CM | POA: Diagnosis not present

## 2023-07-01 DIAGNOSIS — G9341 Metabolic encephalopathy: Secondary | ICD-10-CM | POA: Diagnosis not present

## 2023-07-01 DIAGNOSIS — S0003XA Contusion of scalp, initial encounter: Secondary | ICD-10-CM | POA: Diagnosis not present

## 2023-07-01 DIAGNOSIS — E875 Hyperkalemia: Secondary | ICD-10-CM | POA: Diagnosis not present

## 2023-07-01 DIAGNOSIS — R579 Shock, unspecified: Secondary | ICD-10-CM | POA: Diagnosis not present

## 2023-07-01 DIAGNOSIS — N183 Chronic kidney disease, stage 3 unspecified: Secondary | ICD-10-CM | POA: Diagnosis not present

## 2023-07-01 DIAGNOSIS — E860 Dehydration: Secondary | ICD-10-CM | POA: Diagnosis not present

## 2023-07-01 DIAGNOSIS — G8929 Other chronic pain: Secondary | ICD-10-CM | POA: Diagnosis not present

## 2023-07-01 DIAGNOSIS — K6289 Other specified diseases of anus and rectum: Secondary | ICD-10-CM | POA: Diagnosis not present

## 2023-07-01 DIAGNOSIS — E785 Hyperlipidemia, unspecified: Secondary | ICD-10-CM | POA: Diagnosis not present

## 2023-07-01 DIAGNOSIS — E871 Hypo-osmolality and hyponatremia: Secondary | ICD-10-CM | POA: Diagnosis not present

## 2023-07-01 DIAGNOSIS — R6520 Severe sepsis without septic shock: Secondary | ICD-10-CM | POA: Diagnosis not present

## 2023-07-01 DIAGNOSIS — E861 Hypovolemia: Secondary | ICD-10-CM | POA: Diagnosis not present

## 2023-07-01 DIAGNOSIS — N179 Acute kidney failure, unspecified: Secondary | ICD-10-CM | POA: Diagnosis not present

## 2023-07-01 DIAGNOSIS — A419 Sepsis, unspecified organism: Secondary | ICD-10-CM | POA: Diagnosis not present

## 2023-07-01 DIAGNOSIS — Z936 Other artificial openings of urinary tract status: Secondary | ICD-10-CM | POA: Diagnosis not present

## 2023-07-01 DIAGNOSIS — E43 Unspecified severe protein-calorie malnutrition: Secondary | ICD-10-CM | POA: Diagnosis not present

## 2023-07-01 DIAGNOSIS — R627 Adult failure to thrive: Secondary | ICD-10-CM | POA: Diagnosis not present

## 2023-07-01 DIAGNOSIS — N1339 Other hydronephrosis: Secondary | ICD-10-CM | POA: Diagnosis not present

## 2023-07-01 DIAGNOSIS — R5383 Other fatigue: Secondary | ICD-10-CM | POA: Diagnosis not present

## 2023-07-01 DIAGNOSIS — Z781 Physical restraint status: Secondary | ICD-10-CM | POA: Diagnosis not present

## 2023-07-01 DIAGNOSIS — E039 Hypothyroidism, unspecified: Secondary | ICD-10-CM | POA: Diagnosis not present

## 2023-07-01 DIAGNOSIS — G250 Essential tremor: Secondary | ICD-10-CM | POA: Diagnosis not present

## 2023-07-01 DIAGNOSIS — I498 Other specified cardiac arrhythmias: Secondary | ICD-10-CM | POA: Diagnosis not present

## 2023-07-02 ENCOUNTER — Other Ambulatory Visit: Payer: Self-pay | Admitting: *Deleted

## 2023-07-02 DIAGNOSIS — E860 Dehydration: Secondary | ICD-10-CM | POA: Diagnosis not present

## 2023-07-02 DIAGNOSIS — I959 Hypotension, unspecified: Secondary | ICD-10-CM | POA: Diagnosis not present

## 2023-07-02 DIAGNOSIS — K6289 Other specified diseases of anus and rectum: Secondary | ICD-10-CM | POA: Diagnosis not present

## 2023-07-02 DIAGNOSIS — A419 Sepsis, unspecified organism: Secondary | ICD-10-CM | POA: Diagnosis not present

## 2023-07-02 DIAGNOSIS — N179 Acute kidney failure, unspecified: Secondary | ICD-10-CM | POA: Diagnosis not present

## 2023-07-02 DIAGNOSIS — R5383 Other fatigue: Secondary | ICD-10-CM | POA: Diagnosis not present

## 2023-07-02 DIAGNOSIS — I498 Other specified cardiac arrhythmias: Secondary | ICD-10-CM | POA: Diagnosis not present

## 2023-07-02 DIAGNOSIS — N1339 Other hydronephrosis: Secondary | ICD-10-CM | POA: Diagnosis not present

## 2023-07-02 DIAGNOSIS — S0003XA Contusion of scalp, initial encounter: Secondary | ICD-10-CM | POA: Diagnosis not present

## 2023-07-02 DIAGNOSIS — R6520 Severe sepsis without septic shock: Secondary | ICD-10-CM | POA: Diagnosis not present

## 2023-07-02 NOTE — Patient Outreach (Signed)
 Care Management  Transitions of Care Program Transitions of Care Post-discharge week 2/ day # 8  07/02/2023 Name: [name] MRN: [id number] DOB: [birth year]  Subjective: [name] is a 68 y.o. year old female who is a primary care patient of Patient, [name]. The Care Management team was unable to reach the patient by phone to assess and address transitions of care needs.   07/01/23: notified by [facility] leadership that patient had been outreached to explore options for scheduling/ establishing care for PCP/ HFU office visit: per leadership, patient declined scheduling with [facility] provider, stating that she had made arrangements to be seen by outside (non-[facility]) provider with another health system; leadership recommended TOC 30-day program case closure accordingly, with outreach call as scheduled on 07/02/23 to close loop with patient  07/02/23:  scheduled outreach attempted-- unsuccessful-- unable to leave patient/ spouse voicemail; TOC 30-day program case closure completed as advised by leadership   Goals Addressed             This Visit's Progress    TOC 30-day Program Care Plan       Current Barriers:  Medication management patient and caregiver verbalize significant confusion around what meds patient has at home- and what she should be taking-- has not had PCP care since June 2024 Provider appointments was dismissed from former PCP ([facility]) on 12/06/2022; has not had primary care since then; has new patient appointment arranged for tomorrow 06/26/23 with Alyson Back, NP for establishment of care Home Health services pending through [facility] for RN:  this needs to be verified/ confirmed by new PCP 06/26/23 before services can be initiated  Multiple recent ED visits/ inpatient hospital admissions/ SNF rehab visits over last 12 months: 6 ED visits with discharge home; 7 inpatient hospital admissions   RNCM Clinical Goal(s):  Patient will work with the Care Management  team over the next 30 days to address Transition of Care Barriers: Medication Management Provider appointments Home Health services take all medications exactly as prescribed and will call provider for medication related questions as evidenced by review of medications/ patient caregiver reporting/ provider collaboration as indicated during [facility] 30-day program RN CM weekly outreaches attend all scheduled medical appointments: 06/26/23-- new patient PCP appointment for HFU/ establishment of care; 07/03/23- [facility] urology provider; 07/18/23- [facility] surgical provider as evidenced by review of same in EHR/ patient-caregiver reporting during [facility] 30-day program RN CM weekly outreaches not experience hospital admission as evidenced by review of EMR. Hospital Admissions in last 6 months = 7 inpatient admissions over last 12 months  through collaboration with RN Care manager, provider, and care team.   Interventions: Evaluation of current treatment plan related to  self management and patient's adherence to plan as established by provider  06/26/23: Care Coordination outreach completed with patient's spouse [name] on [facility] DPR: please see care coordination note dated 06/26/23 for details re: patient's scheduled HFU office visit being cancelled by provider  07/01/23: notified by [facility] leadership that patient had been outreached to explore options for scheduling/ establishing care for PCP/ HFU office visit: per leadership, patient declined scheduling with [facility] provider, stating that she had made arrangements to be seen by outside (non-[facility]) provider with another health system; leadership recommended TOC 30-day program case closure accordingly, with outreach call as scheduled on 07/02/23 to close loop with patient  07/02/23:  scheduled outreach attempted-- unsuccessful-- unable to leave patient/ spouse voicemail; TOC 30-day program case closure completed as advised by leadership  Transitions of Care:    Case Closure as advised by [facility] leadership team  07/02/23  provider that was scheduled to see patient for HFU/ establishment of care on 06/26/23 cancelled the appointment on 06/25/23 (day of patient discharge from hospital); [facility] leadership informed me on 07/01/23 that patient had since decided to seek PCP services at outside (non-[facility]) health system and advised TOC 30-day program case closure accordingly  Patient Goals/Self-Care Activities:  Follow Up Plan:  No further follow up required: TOC 30-day program case closure, as above           Plan: No further outreach attempts will be made at this time.  We were unable to reach the patient today for case closure: case closed per [facility] leadership directive.  [name], RN, BSN, Media planner  Transitions of Care  [facility] - [facility] [phone]: direct office

## 2023-07-03 ENCOUNTER — Encounter: Payer: Medicare Other | Admitting: *Deleted

## 2023-07-03 DIAGNOSIS — Z515 Encounter for palliative care: Secondary | ICD-10-CM | POA: Diagnosis not present

## 2023-07-03 DIAGNOSIS — N179 Acute kidney failure, unspecified: Secondary | ICD-10-CM | POA: Diagnosis not present

## 2023-07-03 DIAGNOSIS — E861 Hypovolemia: Secondary | ICD-10-CM | POA: Diagnosis not present

## 2023-07-04 DIAGNOSIS — E861 Hypovolemia: Secondary | ICD-10-CM | POA: Diagnosis not present

## 2023-07-04 DIAGNOSIS — N179 Acute kidney failure, unspecified: Secondary | ICD-10-CM | POA: Diagnosis not present

## 2023-07-05 DIAGNOSIS — N179 Acute kidney failure, unspecified: Secondary | ICD-10-CM | POA: Diagnosis not present

## 2023-07-06 DIAGNOSIS — N179 Acute kidney failure, unspecified: Secondary | ICD-10-CM | POA: Diagnosis not present

## 2023-07-07 DIAGNOSIS — N179 Acute kidney failure, unspecified: Secondary | ICD-10-CM | POA: Diagnosis not present

## 2023-07-08 DIAGNOSIS — E861 Hypovolemia: Secondary | ICD-10-CM | POA: Diagnosis not present

## 2023-07-08 DIAGNOSIS — N1339 Other hydronephrosis: Secondary | ICD-10-CM | POA: Diagnosis not present

## 2023-07-08 DIAGNOSIS — N179 Acute kidney failure, unspecified: Secondary | ICD-10-CM | POA: Diagnosis not present

## 2023-07-08 DIAGNOSIS — I959 Hypotension, unspecified: Secondary | ICD-10-CM | POA: Diagnosis not present

## 2023-07-08 DIAGNOSIS — E860 Dehydration: Secondary | ICD-10-CM | POA: Diagnosis not present

## 2023-07-09 DIAGNOSIS — I959 Hypotension, unspecified: Secondary | ICD-10-CM | POA: Diagnosis not present

## 2023-07-09 DIAGNOSIS — E861 Hypovolemia: Secondary | ICD-10-CM | POA: Diagnosis not present

## 2023-07-09 DIAGNOSIS — N179 Acute kidney failure, unspecified: Secondary | ICD-10-CM | POA: Diagnosis not present

## 2023-07-09 DIAGNOSIS — Z466 Encounter for fitting and adjustment of urinary device: Secondary | ICD-10-CM | POA: Diagnosis not present

## 2023-07-09 DIAGNOSIS — E860 Dehydration: Secondary | ICD-10-CM | POA: Diagnosis not present

## 2023-08-01 DIAGNOSIS — N183 Chronic kidney disease, stage 3 unspecified: Secondary | ICD-10-CM | POA: Diagnosis not present

## 2023-08-01 DIAGNOSIS — E43 Unspecified severe protein-calorie malnutrition: Secondary | ICD-10-CM | POA: Diagnosis not present

## 2023-08-01 DIAGNOSIS — N1330 Unspecified hydronephrosis: Secondary | ICD-10-CM | POA: Diagnosis not present

## 2023-08-01 DIAGNOSIS — R319 Hematuria, unspecified: Secondary | ICD-10-CM | POA: Diagnosis not present

## 2023-08-01 DIAGNOSIS — Z66 Do not resuscitate: Secondary | ICD-10-CM | POA: Diagnosis not present

## 2023-08-01 DIAGNOSIS — Z681 Body mass index (BMI) 19 or less, adult: Secondary | ICD-10-CM | POA: Diagnosis not present

## 2023-08-01 DIAGNOSIS — A419 Sepsis, unspecified organism: Secondary | ICD-10-CM | POA: Diagnosis not present

## 2023-08-01 DIAGNOSIS — E872 Acidosis, unspecified: Secondary | ICD-10-CM | POA: Diagnosis not present

## 2023-08-01 DIAGNOSIS — K565 Intestinal adhesions [bands], unspecified as to partial versus complete obstruction: Secondary | ICD-10-CM | POA: Diagnosis not present

## 2023-08-01 DIAGNOSIS — I959 Hypotension, unspecified: Secondary | ICD-10-CM | POA: Diagnosis not present

## 2023-08-01 DIAGNOSIS — E861 Hypovolemia: Secondary | ICD-10-CM | POA: Diagnosis not present

## 2023-08-01 DIAGNOSIS — E860 Dehydration: Secondary | ICD-10-CM | POA: Diagnosis not present

## 2023-08-01 DIAGNOSIS — E785 Hyperlipidemia, unspecified: Secondary | ICD-10-CM | POA: Diagnosis not present

## 2023-08-01 DIAGNOSIS — E039 Hypothyroidism, unspecified: Secondary | ICD-10-CM | POA: Diagnosis not present

## 2023-08-01 DIAGNOSIS — R7989 Other specified abnormal findings of blood chemistry: Secondary | ICD-10-CM | POA: Diagnosis not present

## 2023-08-01 DIAGNOSIS — Z932 Ileostomy status: Secondary | ICD-10-CM | POA: Diagnosis not present

## 2023-08-01 DIAGNOSIS — Z515 Encounter for palliative care: Secondary | ICD-10-CM | POA: Diagnosis not present

## 2023-08-01 DIAGNOSIS — Z87891 Personal history of nicotine dependence: Secondary | ICD-10-CM | POA: Diagnosis not present

## 2023-08-01 DIAGNOSIS — N179 Acute kidney failure, unspecified: Secondary | ICD-10-CM | POA: Diagnosis not present

## 2023-08-01 DIAGNOSIS — E875 Hyperkalemia: Secondary | ICD-10-CM | POA: Diagnosis not present

## 2023-08-01 DIAGNOSIS — R7401 Elevation of levels of liver transaminase levels: Secondary | ICD-10-CM | POA: Diagnosis not present

## 2023-08-01 DIAGNOSIS — N390 Urinary tract infection, site not specified: Secondary | ICD-10-CM | POA: Diagnosis not present

## 2023-08-01 DIAGNOSIS — E871 Hypo-osmolality and hyponatremia: Secondary | ICD-10-CM | POA: Diagnosis not present

## 2023-08-01 DIAGNOSIS — E878 Other disorders of electrolyte and fluid balance, not elsewhere classified: Secondary | ICD-10-CM | POA: Diagnosis not present

## 2023-08-01 DIAGNOSIS — N189 Chronic kidney disease, unspecified: Secondary | ICD-10-CM | POA: Diagnosis not present

## 2023-08-01 DIAGNOSIS — E8729 Other acidosis: Secondary | ICD-10-CM | POA: Diagnosis not present

## 2023-08-01 DIAGNOSIS — R627 Adult failure to thrive: Secondary | ICD-10-CM | POA: Diagnosis not present

## 2023-08-02 DIAGNOSIS — E871 Hypo-osmolality and hyponatremia: Secondary | ICD-10-CM | POA: Diagnosis not present

## 2023-08-02 DIAGNOSIS — N179 Acute kidney failure, unspecified: Secondary | ICD-10-CM | POA: Diagnosis not present

## 2023-08-02 DIAGNOSIS — E875 Hyperkalemia: Secondary | ICD-10-CM | POA: Diagnosis not present

## 2023-08-03 DIAGNOSIS — E871 Hypo-osmolality and hyponatremia: Secondary | ICD-10-CM | POA: Diagnosis not present

## 2023-08-03 DIAGNOSIS — N179 Acute kidney failure, unspecified: Secondary | ICD-10-CM | POA: Diagnosis not present

## 2023-08-04 DIAGNOSIS — N179 Acute kidney failure, unspecified: Secondary | ICD-10-CM | POA: Diagnosis not present

## 2023-08-04 DIAGNOSIS — N189 Chronic kidney disease, unspecified: Secondary | ICD-10-CM | POA: Diagnosis not present

## 2023-08-05 DIAGNOSIS — N179 Acute kidney failure, unspecified: Secondary | ICD-10-CM | POA: Diagnosis not present

## 2023-08-06 DIAGNOSIS — N179 Acute kidney failure, unspecified: Secondary | ICD-10-CM | POA: Diagnosis not present

## 2023-08-07 DIAGNOSIS — N179 Acute kidney failure, unspecified: Secondary | ICD-10-CM | POA: Diagnosis not present

## 2023-08-08 DIAGNOSIS — K565 Intestinal adhesions [bands], unspecified as to partial versus complete obstruction: Secondary | ICD-10-CM | POA: Diagnosis not present

## 2023-08-08 DIAGNOSIS — N179 Acute kidney failure, unspecified: Secondary | ICD-10-CM | POA: Diagnosis not present

## 2023-08-08 DIAGNOSIS — Z932 Ileostomy status: Secondary | ICD-10-CM | POA: Diagnosis not present

## 2023-08-11 DIAGNOSIS — N179 Acute kidney failure, unspecified: Secondary | ICD-10-CM | POA: Diagnosis not present

## 2023-08-11 DIAGNOSIS — N3000 Acute cystitis without hematuria: Secondary | ICD-10-CM | POA: Diagnosis not present

## 2023-08-11 DIAGNOSIS — E871 Hypo-osmolality and hyponatremia: Secondary | ICD-10-CM | POA: Diagnosis not present

## 2023-08-11 DIAGNOSIS — J302 Other seasonal allergic rhinitis: Secondary | ICD-10-CM | POA: Diagnosis not present

## 2023-08-11 DIAGNOSIS — K219 Gastro-esophageal reflux disease without esophagitis: Secondary | ICD-10-CM | POA: Diagnosis not present

## 2023-08-11 DIAGNOSIS — N1831 Chronic kidney disease, stage 3a: Secondary | ICD-10-CM | POA: Diagnosis not present

## 2023-08-11 DIAGNOSIS — G43019 Migraine without aura, intractable, without status migrainosus: Secondary | ICD-10-CM | POA: Diagnosis not present

## 2023-08-11 DIAGNOSIS — Z0189 Encounter for other specified special examinations: Secondary | ICD-10-CM | POA: Diagnosis not present

## 2023-08-11 DIAGNOSIS — R300 Dysuria: Secondary | ICD-10-CM | POA: Diagnosis not present

## 2023-08-11 DIAGNOSIS — E039 Hypothyroidism, unspecified: Secondary | ICD-10-CM | POA: Diagnosis not present

## 2023-08-27 DIAGNOSIS — R519 Headache, unspecified: Secondary | ICD-10-CM | POA: Diagnosis not present

## 2023-08-27 DIAGNOSIS — R8290 Unspecified abnormal findings in urine: Secondary | ICD-10-CM | POA: Diagnosis not present

## 2023-08-27 DIAGNOSIS — Z939 Artificial opening status, unspecified: Secondary | ICD-10-CM | POA: Diagnosis not present

## 2023-08-27 DIAGNOSIS — Z936 Other artificial openings of urinary tract status: Secondary | ICD-10-CM | POA: Diagnosis not present

## 2023-09-03 ENCOUNTER — Inpatient Hospital Stay (HOSPITAL_COMMUNITY)
Admission: EM | Admit: 2023-09-03 | Discharge: 2023-09-19 | DRG: 698 | Disposition: A | Discharge status: Hospice | Source: Ambulatory Visit | Attending: Internal Medicine | Admitting: Internal Medicine

## 2023-09-03 ENCOUNTER — Inpatient Hospital Stay (HOSPITAL_COMMUNITY)

## 2023-09-03 ENCOUNTER — Encounter (HOSPITAL_COMMUNITY): Payer: Self-pay | Admitting: Emergency Medicine

## 2023-09-03 ENCOUNTER — Other Ambulatory Visit: Payer: Self-pay

## 2023-09-03 DIAGNOSIS — E872 Acidosis, unspecified: Secondary | ICD-10-CM | POA: Diagnosis not present

## 2023-09-03 DIAGNOSIS — G9349 Other encephalopathy: Secondary | ICD-10-CM | POA: Diagnosis present

## 2023-09-03 DIAGNOSIS — Z66 Do not resuscitate: Secondary | ICD-10-CM | POA: Diagnosis not present

## 2023-09-03 DIAGNOSIS — Z885 Allergy status to narcotic agent status: Secondary | ICD-10-CM

## 2023-09-03 DIAGNOSIS — E875 Hyperkalemia: Secondary | ICD-10-CM | POA: Diagnosis not present

## 2023-09-03 DIAGNOSIS — Z7189 Other specified counseling: Secondary | ICD-10-CM

## 2023-09-03 DIAGNOSIS — D649 Anemia, unspecified: Secondary | ICD-10-CM | POA: Diagnosis not present

## 2023-09-03 DIAGNOSIS — E861 Hypovolemia: Secondary | ICD-10-CM | POA: Diagnosis not present

## 2023-09-03 DIAGNOSIS — N183 Chronic kidney disease, stage 3 unspecified: Secondary | ICD-10-CM | POA: Diagnosis present

## 2023-09-03 DIAGNOSIS — N170 Acute kidney failure with tubular necrosis: Secondary | ICD-10-CM | POA: Diagnosis present

## 2023-09-03 DIAGNOSIS — E039 Hypothyroidism, unspecified: Secondary | ICD-10-CM | POA: Diagnosis not present

## 2023-09-03 DIAGNOSIS — R198 Other specified symptoms and signs involving the digestive system and abdomen: Secondary | ICD-10-CM | POA: Diagnosis not present

## 2023-09-03 DIAGNOSIS — Z932 Ileostomy status: Secondary | ICD-10-CM | POA: Diagnosis not present

## 2023-09-03 DIAGNOSIS — Z681 Body mass index (BMI) 19 or less, adult: Secondary | ICD-10-CM | POA: Diagnosis not present

## 2023-09-03 DIAGNOSIS — N179 Acute kidney failure, unspecified: Secondary | ICD-10-CM | POA: Diagnosis not present

## 2023-09-03 DIAGNOSIS — G8929 Other chronic pain: Secondary | ICD-10-CM | POA: Diagnosis present

## 2023-09-03 DIAGNOSIS — E8721 Acute metabolic acidosis: Secondary | ICD-10-CM | POA: Diagnosis present

## 2023-09-03 DIAGNOSIS — B3749 Other urogenital candidiasis: Secondary | ICD-10-CM | POA: Diagnosis not present

## 2023-09-03 DIAGNOSIS — T83512A Infection and inflammatory reaction due to nephrostomy catheter, initial encounter: Secondary | ICD-10-CM | POA: Diagnosis not present

## 2023-09-03 DIAGNOSIS — Z90710 Acquired absence of both cervix and uterus: Secondary | ICD-10-CM

## 2023-09-03 DIAGNOSIS — D696 Thrombocytopenia, unspecified: Secondary | ICD-10-CM | POA: Diagnosis present

## 2023-09-03 DIAGNOSIS — K56609 Unspecified intestinal obstruction, unspecified as to partial versus complete obstruction: Secondary | ICD-10-CM | POA: Diagnosis not present

## 2023-09-03 DIAGNOSIS — Z9889 Other specified postprocedural states: Secondary | ICD-10-CM | POA: Diagnosis not present

## 2023-09-03 DIAGNOSIS — N184 Chronic kidney disease, stage 4 (severe): Secondary | ICD-10-CM | POA: Diagnosis not present

## 2023-09-03 DIAGNOSIS — Z931 Gastrostomy status: Secondary | ICD-10-CM | POA: Diagnosis not present

## 2023-09-03 DIAGNOSIS — Z436 Encounter for attention to other artificial openings of urinary tract: Secondary | ICD-10-CM | POA: Diagnosis not present

## 2023-09-03 DIAGNOSIS — J9601 Acute respiratory failure with hypoxia: Secondary | ICD-10-CM | POA: Diagnosis not present

## 2023-09-03 DIAGNOSIS — Z515 Encounter for palliative care: Secondary | ICD-10-CM

## 2023-09-03 DIAGNOSIS — N178 Other acute kidney failure: Secondary | ICD-10-CM | POA: Diagnosis not present

## 2023-09-03 DIAGNOSIS — R392 Extrarenal uremia: Secondary | ICD-10-CM | POA: Diagnosis not present

## 2023-09-03 DIAGNOSIS — R64 Cachexia: Secondary | ICD-10-CM | POA: Diagnosis not present

## 2023-09-03 DIAGNOSIS — Z833 Family history of diabetes mellitus: Secondary | ICD-10-CM

## 2023-09-03 DIAGNOSIS — E871 Hypo-osmolality and hyponatremia: Secondary | ICD-10-CM | POA: Diagnosis present

## 2023-09-03 DIAGNOSIS — E1122 Type 2 diabetes mellitus with diabetic chronic kidney disease: Secondary | ICD-10-CM | POA: Diagnosis present

## 2023-09-03 DIAGNOSIS — Z79899 Other long term (current) drug therapy: Secondary | ICD-10-CM

## 2023-09-03 DIAGNOSIS — L89151 Pressure ulcer of sacral region, stage 1: Secondary | ICD-10-CM | POA: Diagnosis present

## 2023-09-03 DIAGNOSIS — N136 Pyonephrosis: Secondary | ICD-10-CM | POA: Diagnosis present

## 2023-09-03 DIAGNOSIS — E43 Unspecified severe protein-calorie malnutrition: Secondary | ICD-10-CM | POA: Diagnosis present

## 2023-09-03 DIAGNOSIS — I129 Hypertensive chronic kidney disease with stage 1 through stage 4 chronic kidney disease, or unspecified chronic kidney disease: Secondary | ICD-10-CM | POA: Diagnosis not present

## 2023-09-03 DIAGNOSIS — E860 Dehydration: Secondary | ICD-10-CM | POA: Diagnosis present

## 2023-09-03 DIAGNOSIS — R112 Nausea with vomiting, unspecified: Secondary | ICD-10-CM | POA: Diagnosis not present

## 2023-09-03 DIAGNOSIS — N1831 Chronic kidney disease, stage 3a: Secondary | ICD-10-CM | POA: Diagnosis not present

## 2023-09-03 DIAGNOSIS — A419 Sepsis, unspecified organism: Secondary | ICD-10-CM | POA: Diagnosis present

## 2023-09-03 DIAGNOSIS — Z794 Long term (current) use of insulin: Secondary | ICD-10-CM | POA: Diagnosis not present

## 2023-09-03 DIAGNOSIS — E1165 Type 2 diabetes mellitus with hyperglycemia: Secondary | ICD-10-CM | POA: Diagnosis not present

## 2023-09-03 DIAGNOSIS — K3189 Other diseases of stomach and duodenum: Secondary | ICD-10-CM | POA: Diagnosis not present

## 2023-09-03 DIAGNOSIS — I700 Atherosclerosis of aorta: Secondary | ICD-10-CM | POA: Diagnosis not present

## 2023-09-03 DIAGNOSIS — Z923 Personal history of irradiation: Secondary | ICD-10-CM

## 2023-09-03 DIAGNOSIS — Z452 Encounter for adjustment and management of vascular access device: Secondary | ICD-10-CM | POA: Diagnosis not present

## 2023-09-03 DIAGNOSIS — G9341 Metabolic encephalopathy: Secondary | ICD-10-CM | POA: Diagnosis present

## 2023-09-03 DIAGNOSIS — Z8541 Personal history of malignant neoplasm of cervix uteri: Secondary | ICD-10-CM

## 2023-09-03 DIAGNOSIS — Z8249 Family history of ischemic heart disease and other diseases of the circulatory system: Secondary | ICD-10-CM

## 2023-09-03 DIAGNOSIS — E876 Hypokalemia: Secondary | ICD-10-CM | POA: Diagnosis not present

## 2023-09-03 DIAGNOSIS — A4189 Other specified sepsis: Secondary | ICD-10-CM | POA: Diagnosis not present

## 2023-09-03 DIAGNOSIS — Z888 Allergy status to other drugs, medicaments and biological substances status: Secondary | ICD-10-CM

## 2023-09-03 DIAGNOSIS — E11649 Type 2 diabetes mellitus with hypoglycemia without coma: Secondary | ICD-10-CM | POA: Diagnosis present

## 2023-09-03 DIAGNOSIS — R6521 Severe sepsis with septic shock: Secondary | ICD-10-CM | POA: Diagnosis not present

## 2023-09-03 DIAGNOSIS — Z87891 Personal history of nicotine dependence: Secondary | ICD-10-CM

## 2023-09-03 DIAGNOSIS — L8990 Pressure ulcer of unspecified site, unspecified stage: Secondary | ICD-10-CM | POA: Insufficient documentation

## 2023-09-03 DIAGNOSIS — I9589 Other hypotension: Secondary | ICD-10-CM | POA: Diagnosis present

## 2023-09-03 DIAGNOSIS — Z79890 Hormone replacement therapy (postmenopausal): Secondary | ICD-10-CM

## 2023-09-03 DIAGNOSIS — E46 Unspecified protein-calorie malnutrition: Secondary | ICD-10-CM | POA: Diagnosis not present

## 2023-09-03 DIAGNOSIS — G9340 Encephalopathy, unspecified: Secondary | ICD-10-CM | POA: Diagnosis not present

## 2023-09-03 DIAGNOSIS — Y833 Surgical operation with formation of external stoma as the cause of abnormal reaction of the patient, or of later complication, without mention of misadventure at the time of the procedure: Secondary | ICD-10-CM | POA: Diagnosis present

## 2023-09-03 DIAGNOSIS — R7303 Prediabetes: Secondary | ICD-10-CM | POA: Diagnosis not present

## 2023-09-03 DIAGNOSIS — N261 Atrophy of kidney (terminal): Secondary | ICD-10-CM | POA: Diagnosis present

## 2023-09-03 DIAGNOSIS — I959 Hypotension, unspecified: Secondary | ICD-10-CM | POA: Diagnosis not present

## 2023-09-03 LAB — SODIUM: Sodium: 119 mmol/L — CL (ref 135–145)

## 2023-09-03 LAB — COMPREHENSIVE METABOLIC PANEL
ALT: 54 U/L — ABNORMAL HIGH (ref 0–44)
AST: 30 U/L (ref 15–41)
Albumin: 4.3 g/dL (ref 3.5–5.0)
Alkaline Phosphatase: 195 U/L — ABNORMAL HIGH (ref 38–126)
Anion gap: 34 — ABNORMAL HIGH (ref 5–15)
BUN: 169 mg/dL — ABNORMAL HIGH (ref 8–23)
CO2: 10 mmol/L — ABNORMAL LOW (ref 22–32)
Calcium: 9.1 mg/dL (ref 8.9–10.3)
Chloride: 73 mmol/L — ABNORMAL LOW (ref 98–111)
Creatinine, Ser: 10.15 mg/dL — ABNORMAL HIGH (ref 0.44–1.00)
GFR, Estimated: 4 mL/min — ABNORMAL LOW (ref >60)
Glucose, Bld: 255 mg/dL — ABNORMAL HIGH (ref 70–99)
Potassium: 6.6 mmol/L (ref 3.5–5.1)
Sodium: 117 mmol/L — CL (ref 135–145)
Total Bilirubin: 0.8 mg/dL (ref 0.0–1.2)
Total Protein: 8 g/dL (ref 6.5–8.1)

## 2023-09-03 LAB — URINALYSIS, ROUTINE W REFLEX MICROSCOPIC
Bilirubin Urine: NEGATIVE
Glucose, UA: NEGATIVE mg/dL
Ketones, ur: NEGATIVE mg/dL
Nitrite: NEGATIVE
Protein, ur: 100 mg/dL — AB
Specific Gravity, Urine: 1.012 (ref 1.005–1.030)
WBC, UA: >50 WBC/hpf (ref 0–5)
pH: 6 (ref 5.0–8.0)

## 2023-09-03 LAB — CBG MONITORING, ED: Glucose-Capillary: 127 mg/dL — ABNORMAL HIGH (ref 70–99)

## 2023-09-03 LAB — CREATININE, SERUM
Creatinine, Ser: 10.34 mg/dL — ABNORMAL HIGH (ref 0.44–1.00)
GFR, Estimated: 4 mL/min — ABNORMAL LOW (ref >60)

## 2023-09-03 LAB — PROTIME-INR
INR: 1.1 (ref 0.8–1.2)
Prothrombin Time: 14.9 s (ref 11.4–15.2)

## 2023-09-03 LAB — LACTIC ACID, PLASMA: Lactic Acid, Venous: 3.6 mmol/L (ref 0.5–1.9)

## 2023-09-03 LAB — APTT: aPTT: 28 s (ref 24–36)

## 2023-09-03 MED ORDER — LORAZEPAM 2 MG/ML IJ SOLN
INTRAMUSCULAR | Status: AC
  Filled 2023-09-03: qty 1

## 2023-09-03 MED ORDER — DOCUSATE SODIUM 100 MG PO CAPS: 100.0000 mg | ORAL_CAPSULE | Freq: Two times a day (BID) | ORAL | Status: DC | PRN

## 2023-09-03 MED ORDER — NOREPINEPHRINE 4 MG/250ML-% IV SOLN: 0.0000 ug/min | INTRAVENOUS | Status: DC

## 2023-09-03 MED ORDER — SODIUM CHLORIDE 0.9 % IV SOLN
250.0000 mL | INTRAVENOUS | Status: AC
  Administered 2023-09-04: 250 mL via INTRAVENOUS

## 2023-09-03 MED ORDER — VANCOMYCIN HCL IN DEXTROSE 1-5 GM/200ML-% IV SOLN
1000.0000 mg | Freq: Once | INTRAVENOUS | Status: AC
  Administered 2023-09-04: 1000 mg via INTRAVENOUS
  Filled 2023-09-03: qty 200

## 2023-09-03 MED ORDER — SODIUM CHLORIDE 0.9 % IV SOLN: INTRAVENOUS | Status: DC

## 2023-09-03 MED ORDER — ONDANSETRON HCL 4 MG/2ML IJ SOLN
4.0000 mg | Freq: Four times a day (QID) | INTRAMUSCULAR | Status: DC | PRN
  Administered 2023-09-06 – 2023-09-15 (×7): 4 mg via INTRAVENOUS
  Filled 2023-09-03 (×10): qty 2

## 2023-09-03 MED ORDER — STERILE WATER FOR INJECTION IV SOLN
INTRAVENOUS | Status: DC
  Filled 2023-09-03 (×4): qty 150

## 2023-09-03 MED ORDER — SODIUM BICARBONATE 8.4 % IV SOLN
50.0000 meq | Freq: Once | INTRAVENOUS | Status: AC
Start: 2023-09-03 — End: 2023-09-03
  Administered 2023-09-03: 50 meq via INTRAVENOUS
  Filled 2023-09-03: qty 50

## 2023-09-03 MED ORDER — PIPERACILLIN-TAZOBACTAM 3.375 G IVPB
3.3750 g | Freq: Four times a day (QID) | INTRAVENOUS | Status: DC
  Administered 2023-09-04 – 2023-09-06 (×10): 3.375 g via INTRAVENOUS
  Filled 2023-09-03 (×10): qty 50

## 2023-09-03 MED ORDER — INSULIN ASPART 100 UNIT/ML IV SOLN
10.0000 [IU] | Freq: Once | INTRAVENOUS | Status: AC
Start: 2023-09-03 — End: 2023-09-03
  Administered 2023-09-03: 10 [IU] via INTRAVENOUS

## 2023-09-03 MED ORDER — PRISMASOL BGK 0/2.5 32-2.5 MEQ/L EC SOLN: Status: DC

## 2023-09-03 MED ORDER — POLYETHYLENE GLYCOL 3350 17 G PO PACK: 17.0000 g | PACK | Freq: Every day | ORAL | Status: DC | PRN

## 2023-09-03 MED ORDER — FLUCONAZOLE IN SODIUM CHLORIDE 400-0.9 MG/200ML-% IV SOLN
400.0000 mg | INTRAVENOUS | Status: DC
  Administered 2023-09-04: 400 mg via INTRAVENOUS
  Filled 2023-09-03: qty 200

## 2023-09-03 MED ORDER — HEPARIN SODIUM (PORCINE) 5000 UNIT/ML IJ SOLN
5000.0000 [IU] | Freq: Three times a day (TID) | INTRAMUSCULAR | Status: DC
  Administered 2023-09-03 – 2023-09-19 (×44): 5000 [IU] via SUBCUTANEOUS
  Filled 2023-09-03 (×44): qty 1

## 2023-09-03 MED ORDER — INSULIN ASPART 100 UNIT/ML IJ SOLN
0.0000 [IU] | INTRAMUSCULAR | Status: DC
  Administered 2023-09-04: 2 [IU] via SUBCUTANEOUS
  Administered 2023-09-04: 1 [IU] via SUBCUTANEOUS

## 2023-09-03 MED ORDER — HEPARIN SODIUM (PORCINE) 1000 UNIT/ML DIALYSIS
1000.0000 [IU] | INTRAMUSCULAR | Status: DC | PRN
  Administered 2023-09-04: 3000 [IU] via INTRAVENOUS_CENTRAL
  Administered 2023-09-05: 2400 [IU] via INTRAVENOUS_CENTRAL
  Filled 2023-09-03 (×3): qty 6

## 2023-09-03 MED ORDER — LEVOTHYROXINE SODIUM 25 MCG PO TABS
25.0000 ug | ORAL_TABLET | Freq: Every day | ORAL | Status: DC
  Administered 2023-09-04 – 2023-09-19 (×16): 25 ug via ORAL
  Filled 2023-09-03 (×17): qty 1

## 2023-09-03 MED ORDER — SODIUM CHLORIDE 0.9 % IV SOLN: 1.0000 g | INTRAVENOUS | Status: DC

## 2023-09-03 MED ORDER — DEXTROSE 50 % IV SOLN
1.0000 | Freq: Once | INTRAVENOUS | Status: AC
Start: 2023-09-03 — End: 2023-09-03
  Administered 2023-09-03: 50 mL via INTRAVENOUS
  Filled 2023-09-03 (×2): qty 50

## 2023-09-03 MED ORDER — SODIUM CHLORIDE 0.9 % IV SOLN
2.0000 ug | Freq: Once | INTRAVENOUS | Status: AC
  Administered 2023-09-03: 2 ug via INTRAVENOUS
  Filled 2023-09-03: qty 0.5

## 2023-09-03 MED ORDER — SODIUM ZIRCONIUM CYCLOSILICATE 5 G PO PACK
5.0000 g | PACK | Freq: Every day | ORAL | Status: DC
  Administered 2023-09-03: 5 g via ORAL
  Filled 2023-09-03 (×2): qty 1

## 2023-09-03 MED ORDER — NOREPINEPHRINE 4 MG/250ML-% IV SOLN
2.0000 ug/min | INTRAVENOUS | Status: DC
  Administered 2023-09-04: 2 ug/min via INTRAVENOUS
  Filled 2023-09-03: qty 250

## 2023-09-03 MED ORDER — LORAZEPAM 2 MG/ML IJ SOLN
0.5000 mg | Freq: Once | INTRAMUSCULAR | Status: AC
  Administered 2023-09-03: 0.5 mg via INTRAVENOUS

## 2023-09-03 NOTE — Consult Note (Addendum)
 [facility] Nephrology Consultation Note  Requesting MD: Dr. Rozann Lesches Reason for consult: AKI, Hyperkalemia  HPI:  [name] is a 68 y.o. 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  The patient was admitted in 06/2023 with hyponatremia and AKI, status post percutaneous right-sided nephrostomy tube and ureteral stent placed by urologist.  Treated with IV fluid and TPN.  Also treated with antibiotics for UTI.  The patient went to [facility] for initial consult of CKD when she was found to have critical lab results and was advised to go to ER. In the ER, she is tachycardic, and room air, blood pressure 112/96.  The labs showed sodium level 117, potassium 6.6, CO2 10, BUN 169, creatinine level 10.15, anion gap 34, hemoglobin 9.  UA turbid with a chronic proteinuria, RBC and WBC unknown if it was collected from nephrostomy bag.  She is in the process of receiving insulin, dextrose, Lokelma and being admitted to ICU for further management. Patient was accompanied by her husband.  As per him, she was not eating or drinking well for last few weeks and sees more tired and weak than usual.  No longer on TPN. Per nephrostomy bag he still has some urine around 100 cc. The patient looks tired and not providing much history. 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   Thyroid disease    Vertigo     Past Surgical History:  Procedure Laterality Date   ABDOMINAL ADHESION SURGERY  11/08/2020   Dr [name] - [facility]   ABDOMINAL HYSTERECTOMY  2003   [facility] - Dr [name] - for cervical cancer   BILE DUCT EXPLORATION  10/22/2003   CHOLECYSTECTOMY OPEN  10/22/2003   Dr Zachery Dakins - CBD repair w T tube   CYSTOSCOPY  2021   ILEOSTOMY  11/23/2018   ILEOSTOMY CLOSURE  04/2020   IR PATIENT EVAL TECH 0-60 MINS  06/20/2023   LOW ANTERIOR BOWEL RESECTION  11/23/2018   LAR/takedown colovaginal fistula/loop ileostomy  - [facility]    Family Hx:  Family History  Problem Relation Age of Onset   Heart disease Father    Diabetes Father    Parkinson's disease Neg Hx     Social History:  reports that she has quit smoking. Her smoking use included cigarettes. She has never used smokeless tobacco. She reports that she does not currently use alcohol. She reports that she does not use drugs.  Allergies:  Allergies  Allergen Reactions   Barium-Containing Compounds     hives   Percocet [Oxycodone-Acetaminophen] Itching and Other (See Comments)    migraine     Medications: Prior to Admission medications   Medication Sig Start Date End Date Taking? Authorizing Provider  acetaminophen (TYLENOL) 500 MG tablet Take 1,000 mg by mouth every 6 (six) hours as needed.  [provider]  Amino Acid Infusion in D15W (CLINIMIX/DEXTROSE, 5/15, IV) Inject 42 mLs into the vein See admin instructions. 42 ml per hour, intravenously every 24 hours Patient not taking: Reported on 06/25/2023    [provider]  cetirizine (ZYRTEC) 10 MG tablet Take 10 mg by mouth daily. PRN    [provider]  divalproex (DEPAKOTE) 125 MG DR tablet Take 125 mg by mouth at bedtime.    [provider]  dronabinol (MARINOL) 2.5 MG capsule Take 2.5 mg by mouth 2 (two) times daily. Patient not taking: Reported  on 06/25/2023    [provider]  Fat Emulsion Plant Based (FAT EMULSION 20 %, TPN/TNA, FOR ANES DOCUMENTATION) 20 % EMUL Inject 21 mLs into the vein continuous. 21 ml per hour every 24 hour (250 ml over 12 hours), once lipid bag is completed, disconnect lipid line, flush picc line with 5-10 ml saline flush. Patient not taking: Reported on 06/25/2023    [provider]  fluticasone (FLONASE) 50 MCG/ACT nasal spray Place 1 spray into both nostrils daily. 08/30/22   [provider]  hydrOXYzine (ATARAX) 25 MG tablet Take 25 mg by mouth every 8 (eight) hours as needed for anxiety.    [provider]  Lactobacillus Rhamnosus, GG, (CULTURELLE HEALTH & WELLNESS) CAPS Take 1 capsule by mouth daily. 05/21/23   [provider]  levothyroxine (SYNTHROID) 25 MCG tablet Take 1 tablet (25 mcg total) by mouth daily. 07/17/22   [name], DO  mirtazapine (REMERON) 7.5 MG tablet Take 7.5 mg by mouth at bedtime.    [provider]  Multiple Vitamins-Minerals (MULTIVITAMIN) tablet Take 1 tablet by mouth daily.    [provider]  Multiple Vitamins-Minerals (ONCOVITE) TABS Take 1 tablet by mouth daily. 05/21/23   [provider]  mupirocin ointment (BACTROBAN) 2 % 1 Application 2 (two) times daily. To red area around insertion site for 7 days    [provider]  omeprazole (PRILOSEC) 20 MG capsule Take 20 mg by mouth in the morning. 05/21/23   [provider]  ondansetron (ZOFRAN) 4 MG tablet Take 4 mg by mouth every 6 (six) hours as needed for nausea or vomiting. Patient not taking: Reported on 06/25/2023    [provider]  Ethelda Chick Calcium 500 MG TABS Take 1,250 mg by mouth daily. 05/21/23   [provider]  polyethylene glycol powder (GLYCOLAX/MIRALAX) 17 GM/SCOOP powder Take 1 Container by mouth once. Give 1 scoop by mouth every 24 hours as needed for constipation    [provider]  promethazine  (PHENERGAN) 12.5 MG tablet Take 12.5 mg by mouth every 6 (six) hours as needed for nausea or vomiting. Patient not taking: Reported on 06/25/2023    [provider]  sertraline (ZOLOFT) 25 MG tablet Take 25 mg by mouth daily. Patient not taking: Reported on 06/25/2023    [provider]  sodium chloride 1 g tablet Take 1 g by mouth daily. For 3 days Patient not taking: Reported on 06/25/2023    [provider]    I have reviewed the patient's current medications.  Labs: Renal Panel: Recent Labs  Lab 09/03/23 1703  NA 117*  K 6.6*  CL 73*  CO2 10*  GLUCOSE 255*  BUN 169*  CREATININE 10.15*  CALCIUM 9.1     CBC:    Latest Ref Rng & Units 06/24/2023    1:26 AM 06/23/2023    3:33 AM 06/22/2023  4:42 AM  CBC  WBC 4.0 - 10.5 K/uL 3.7  4.0  4.9   Hemoglobin 12.0 - 15.0 g/dL 9.0  9.1  9.6   Hematocrit 36.0 - 46.0 % 28.3  27.6  29.4   Platelets 150 - 400 K/uL 182  192  190      Anemia Panel:  Recent Labs    06/10/23 0419 06/11/23 0340 06/19/23 0419 06/20/23 0412 06/22/23 0442 06/23/23 0333 06/24/23 0126  HGB 12.2   < > 10.0* 9.2* 9.6* 9.1* 9.0*  MCV 87.8   < > 91.9 91.4 92.5 93.2 94.3  VITAMINB12 601  --   --   --   --   --   --   FOLATE 8.9  --   --   --   --   --   --    < > = values in this interval not displayed.    Recent Labs  Lab 09/03/23 1703  AST 30  ALT 54*  ALKPHOS 195*  BILITOT 0.8  PROT 8.0  ALBUMIN 4.3    Lab Results  Component Value Date   HGBA1C 4.7 12/11/2020    ROS:  Pertinent items noted in HPI and remainder of comprehensive ROS otherwise negative.  Physical Exam: Vitals:   09/03/23 1625 09/03/23 1900  BP: (!) 112/96 115/82  Pulse: 99 74  Resp: 16 17  SpO2: 99% 100%     General exam: Critically ill looking female, cachectic, not in distress Respiratory system: Clear to auscultation. Respiratory effort normal. No wheezing or crackle Cardiovascular system: S1 & S2 heard, RRR.  No pedal  edema. Gastrointestinal system: Abdomen is nondistended, ileostomy bag with loose stool. Central nervous system: Alert awake but somnolent/confused. Extremities: No edema. Skin: No rashes, lesions or ulcers  Assessment/Plan:  # Acute kidney injury concerning for obstructive uropathy and may have ischemic ATN with reduced intravascular volume.  Patient with history of bilateral hydronephrosis and has right nephrostomy tube.  Now with severe metabolic derangement with hyperkalemia, severe acidosis and uremic. We will get a CT scan of abdomen pelvis without contrast. Plan to start CRRT tonight with low blood flow rate and low dialysate flow rate because of very high BUN and hyponatremia. DDAVP before cath placement.  Maintain positive fluid balance of 100 cc an hour and reassess in the morning Strict ins and out and monitor lab closely  # Severe hyponatremia, subacute to chronic: We will aim for sodium level around 122 by tomorrow morning.  Reducing dialysis flow rates to minimize overcorrection.  If sodium goes above 122 by tomorrow morning then recommend to start D5W, discussed with ICU team. Check Na q2 hr.  # Severe hyperkalemia: Treating medically in ER.  CRRT with 0 potassium bath.  Monitor lab.  # Anion gap metabolic acidosis with AKI: Sodium bicarbonate with prefilter and CRRT as above.  # History of obstructive uropathy, bilateral hydronephrosis: Review CT scan of abdomen pelvis point done.  May need urology evaluation pending imaging studies.  Thank you for the consult.  I have discussed with the patient's husband at the bedside and ICU team.  Will continue to follow.   Jazzlynn Rawe Jaynie Collins 09/03/2023, 7:16 PM  [facility].

## 2023-09-03 NOTE — H&P (Cosign Needed Addendum)
 NAME:  [name], MRN:  [id number], DOB:  [birth year], LOS: 0 ADMISSION DATE:  09/03/2023, CONSULTATION DATE: 09/03/23  REFERRING MD:  ED MD Rubin Payor  CHIEF COMPLAINT:  Abnormal Lab work   History of Present Illness:  Pt is a 68 yr female with a significant past medical history for cervical cancer 2003 s/p hysterectomy, hx of TPN use/malnutrition, right hydronephrosis s/p uretal stent 03/2023 and R nephrostomy tube placed in 04/2023 due to hospitalization at [facility] for obstructive uropathy AKI and BL hydronephrosis complicated by Enterobacter and Candida UTI,  rectovaginal s/p anterior resection with colovaginal fistula repair, loop ileostomy with closure in 2021, SBO s/p with lysis of adhesions, recurrent SBO, ileum/cecum resection with ileostomy creation 2024, and recent [facility] hospitalization (2/14-2/23/25) which revealed patient had severe dehydration in the setting of high output and poor oral intake who presents to [facility] ED from [facility] due to abnormal lab work indicating acute renal failure (Cr >10, BUN >169, Anion Gap acidosis, severe hyperkalemia-k>6, and hyponatremia Na 117. Due to severity of acute kidney failure and acidosis, PCCM was consulted for ICU admission and further management along with nephrology consult.  Upon initial assessment, patient sitting up in ED stretcher with husband at beside. Patient with intermittent confusion, and disoriented to situation/time. Therefore husband able to provide and assist with HPI. According to husband, had initial appointment with MD Sandford with [facility] and when lab work came back, husband was advised to take patient to [facility] ED immediately. Per husband, over the last few weeks-patient has had issues with sleeping, more fatigued, and having poor oral intake. Husband endorses that ileostomy and right side nephrostomy tube have had moderate output. Patient states that she feels nauseous and sick on her stomach but no vomiting  currently. Husband denies patient having any chills/fevers, and or sick contacts. Patient tachycardic but normotensive, on room air, O2 sats 99 to 100%,  and malnourished.   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 rate 17, height 5\' 2"  (1.575 m), weight 49 kg, SpO2 100%.       No intake or output data in the 24 hours ending 09/03/23 2003 Filed Weights   09/03/23 1628  Weight: 49 kg    Examination General: acute on chronic cachetic older adult female sitting up in ED stretcher, ill appearing HEENT: Normocephalic, PERRLA intact, teeth intact, Pink dry MM CV: s1,s2, RRR, no MRG, No JVD  pulm: clear, diminished, no distress on RA  Abs: bs active, soft, ileostomy with brown stool output-small stoma, right urostomy tube-no signs of infection at site but cloudy/sediment yellow output  Extremities: no edema, no deformity, moves all extremities on command, malnourished  Skin: no rash or lesions Neuro: alert, oriented to self, place, disoriented to time/situation, follows commands  GU: intact, no complaints of vaginal irritation or redness   Resolved Hospital Problem list   N/a   Assessment & Plan:  Acute Renal Failure with Electrolyte Abnormalities Hyperkalemia Hyponatremia   Anion Gap Metabolic Acidosis  Uremia  Suspecting possible uretal  obstruction vs severe dehydration vs urosepsis etiology  2024-obstructive uropathy AKI and BL hydronephrosis complicated by Enterobacter and Candida UTI Urinalysis reveals turbid appearance, small leukocytes, protein-100, and rare bacteria  -not sure if this is from urostomy bag? Patient had televisit on 08/27/23 for vaginal irritation and cloudy urine-started on amoxicillin and diflucan  Cr > 10, BUN 169, K 6.6, Na 117  WBC, Lactic acid/procal pending  P:  STAT 1 amp of sodium bicarb, 10 units of IV insulin, and dextrose 50-1 amp for hyperkalemia  Give one time dose of lokelma 5g  Admit to ICU for HD placement and CRRT-nephro recommending/appreciate assistance  Give DDVAP to prevent overcorrection of hyponatremia  Monitor Na q2hrs- Na goal to 122 by morning, if beginning to overcorrect-start D5W Continue to trend renal function panel, and electrolytes  Obtain CT abdomen/pelvis without contrast-may need urology consult depending on results  Obtain Blood cultures, follow up urine culture  Start on Empiric antibiotics- place on ceftriaxone 1g/24hrs and diflucan 400mg  IV/24hrs  Obtain lactic acid and procalcitonin  Trend WBC, fever curve  Continue to monitor urine output per urostomy tube Continue strict I/Os Continue Adequate renal perfusion  Avoid nephrotoxic agents   Acute Encephalopathy secondary to uremia/hypernatronemia  P: Continue plan as above Initiate Delirium precautions and fall precautions   SBO s/p with lysis of adhesions Recurrent SBO hx  recurrent SBO, ileum/cecum resection with ileostomy creation 2024 Recent admission for severe dehydration and high output  P: Continue to monitor output-has had history of high output   Malnutrition  Hx of TPN use  BMI 19.76 P: Will need RD consult for recs Continue NPO with sips with meds, if patient tolerates sips  Can eventually a Renal diet      Best Practice (right click and "Reselect all SmartList Selections" daily)    Diet/type: NPO w/ oral meds DVT prophylaxis prophylactic heparin  Pressure ulcer(s): N/A GI prophylaxis: PPI Lines: N/A Foley:  Yes, and it is still needed Code Status:  full code Last date of multidisciplinary goals of care discussion [ updated patient and husband at beside with CCM MD Sherryll Burger and MD Ronalee Belts with Nephrology   Labs   CBC: No results for input(s): "WBC", "NEUTROABS", "HGB", "HCT", "MCV", "PLT" in the last 168 hours.  Basic Metabolic Panel: Recent Labs  Lab 09/03/23 1703  NA 117*  K 6.6*  CL 73*  CO2 10*  GLUCOSE 255*  BUN 169*  CREATININE 10.15*  CALCIUM 9.1   GFR: Estimated Creatinine Clearance: 4.2 mL/min (A) (by C-G formula based on SCr of 10.15 mg/dL (H)). No results for input(s): "PROCALCITON", "WBC", "LATICACIDVEN" in the last 168 hours.  Liver Function Tests: Recent Labs  Lab 09/03/23 1703  AST 30  ALT 54*  ALKPHOS 195*  BILITOT 0.8  PROT 8.0  ALBUMIN 4.3   No results for input(s): "LIPASE", "AMYLASE" in the last 168 hours. No results for input(s): "AMMONIA" in the last 168 hours.  ABG    Component Value Date/Time   HCO3 12.5 (L) 04/28/2023 2354   ACIDBASEDEF 12.4 (H) 04/28/2023 2354   O2SAT 29.2 04/28/2023 2354     Coagulation Profile: No results for input(s): "INR", "PROTIME" in the last 168 hours.  Cardiac Enzymes: No results  for input(s): "CKTOTAL", "CKMB", "CKMBINDEX", "TROPONINI" in the last 168 hours.  HbA1C: HB A1C (BAYER DCA - WAIVED)  Date/Time Value Ref Range Status  12/11/2020 10:13 AM 4.7 <7.0 % Final    Comment:                                          Diabetic Adult            <7.0                                       Healthy Adult        4.3 - 5.7                                                           (DCCT/NGSP) American Diabetes Association's Summary of Glycemic Recommendations for Adults with Diabetes: Hemoglobin A1c <7.0%. More stringent glycemic goals (A1c <6.0%) may further reduce complications at  the cost of increased risk of hypoglycemia.     CBG: No results for input(s): "GLUCAP" in the last 168 hours.  Review of Systems:   See HPI   Past Medical History:  She,  has a past medical history of Atrophic kidney, Cancer (HCC), Cervical cancer (HCC), Colovaginal fistula, Pyelonephritis, Renal atrophy, left (11/19/2022), Thyroid disease, and Vertigo.   Surgical History:   Past Surgical History:  Procedure Laterality Date   ABDOMINAL ADHESION SURGERY  11/08/2020   Dr [name] - [facility]   ABDOMINAL HYSTERECTOMY  2003   [facility] - Dr [name] - for cervical cancer   BILE DUCT EXPLORATION  10/22/2003   CHOLECYSTECTOMY OPEN  10/22/2003   Dr Zachery Dakins - CBD repair w T tube   CYSTOSCOPY  2021   ILEOSTOMY  11/23/2018   ILEOSTOMY CLOSURE  04/2020   IR PATIENT EVAL TECH 0-60 MINS  06/20/2023   LOW ANTERIOR BOWEL RESECTION  11/23/2018   LAR/takedown colovaginal fistula/loop ileostomy  - [facility]     Social History:   reports that she has quit smoking. Her smoking use included cigarettes. She has never used smokeless tobacco. She reports that she does not currently use alcohol. She reports that she does not use drugs.   Family History:  Her family history includes Diabetes in her father; Heart disease in her father. There is no history of Parkinson's disease.   Allergies Allergies  Allergen Reactions   Barium-Containing Compounds     hives   Percocet [Oxycodone-Acetaminophen] Itching and Other (See Comments)    migraine      Home Medications  Prior to Admission medications   Medication Sig Start Date End Date Taking? Authorizing Provider  acetaminophen (TYLENOL) 500 MG tablet Take 1,000 mg by mouth every 6 (six) hours as needed.    [provider]  Amino Acid Infusion in D15W (CLINIMIX/DEXTROSE, 5/15, IV) Inject 42 mLs into the vein See admin instructions. 42 ml per hour, intravenously every 24 hours Patient not taking: Reported on 06/25/2023    [provider]   cetirizine (ZYRTEC) 10 MG tablet Take 10 mg by mouth daily. PRN    [provider]  divalproex (DEPAKOTE)  125 MG DR tablet Take 125 mg by mouth at bedtime.    [provider]  dronabinol (MARINOL) 2.5 MG capsule Take 2.5 mg by mouth 2 (two) times daily. Patient not taking: Reported on 06/25/2023    [provider]  Fat Emulsion Plant Based (FAT EMULSION 20 %, TPN/TNA, FOR ANES DOCUMENTATION) 20 % EMUL Inject 21 mLs into the vein continuous. 21 ml per hour every 24 hour (250 ml over 12 hours), once lipid bag is completed, disconnect lipid line, flush picc line with 5-10 ml saline flush. Patient not taking: Reported on 06/25/2023    [provider]  fluticasone (FLONASE) 50 MCG/ACT nasal spray Place 1 spray into both nostrils daily. 08/30/22   [provider]  hydrOXYzine (ATARAX) 25 MG tablet Take 25 mg by mouth every 8 (eight) hours as needed for anxiety.    [provider]  Lactobacillus Rhamnosus, GG, (CULTURELLE HEALTH & WELLNESS) CAPS Take 1 capsule by mouth daily. 05/21/23   [provider]  levothyroxine (SYNTHROID) 25 MCG tablet Take 1 tablet (25 mcg total) by mouth daily. 07/17/22   [name], DO  mirtazapine (REMERON) 7.5 MG tablet Take 7.5 mg by mouth at bedtime.    [provider]  Multiple Vitamins-Minerals (MULTIVITAMIN) tablet Take 1 tablet by mouth daily.    [provider]  Multiple Vitamins-Minerals (ONCOVITE) TABS Take 1 tablet by mouth daily. 05/21/23   [provider]  mupirocin ointment (BACTROBAN) 2 % 1 Application 2 (two) times daily. To red area around insertion site for 7 days    [provider]  omeprazole (PRILOSEC) 20 MG capsule Take 20 mg by mouth in the morning. 05/21/23   [provider]  ondansetron (ZOFRAN) 4 MG tablet Take 4 mg by mouth every 6 (six) hours as needed for nausea or vomiting. Patient not taking: Reported on 06/25/2023    [provider]   Ethelda Chick Calcium 500 MG TABS Take 1,250 mg by mouth daily. 05/21/23   [provider]  polyethylene glycol powder (GLYCOLAX/MIRALAX) 17 GM/SCOOP powder Take 1 Container by mouth once. Give 1 scoop by mouth every 24 hours as needed for constipation    [provider]  promethazine (PHENERGAN) 12.5 MG tablet Take 12.5 mg by mouth every 6 (six) hours as needed for nausea or vomiting. Patient not taking: Reported on 06/25/2023    [provider]  sertraline (ZOLOFT) 25 MG tablet Take 25 mg by mouth daily. Patient not taking: Reported on 06/25/2023    [provider]  sodium chloride 1 g tablet Take 1 g by mouth daily. For 3 days Patient not taking: Reported on 06/25/2023    [provider]     Critical care time: 60 mins     [facility]   [facility] 09/03/2023, 9:25 PM  Please see [email or web address] for pager details.  From 7A-7P if no response, please call [phone]. After hours, please call ELink [phone].

## 2023-09-03 NOTE — ED Notes (Signed)
 RN called [facility] about pt and abnormal results. Waiting return call.

## 2023-09-03 NOTE — ED Provider Triage Note (Signed)
 Emergency Medicine Provider Triage Evaluation Note  [name] , a 68 y.o. female  was evaluated in triage.  Pt complains of saw [facility] today and was referred to ED for abnormal lab.  Unsure what lab was abnormal and cannot view their documents  Review of Systems  Positive: N/v, looser stool in ostomy Negative: Fever, chills, HA, SHOB, CP  Physical Exam  BP (!) 112/96   Pulse 99   Resp 16   Ht 5\' 2"  (1.575 m)   Wt 49 kg   SpO2 99%   BMI 19.76 kg/m  Gen:   Awake, no distress   Resp:  Normal effort  MSK:   Moves extremities without difficulty  Other:    Medical Decision Making  Medically screening exam initiated at 4:59 PM.  Appropriate orders placed.  [name] was informed that the remainder of the evaluation will be completed by another provider, this initial triage assessment does not replace that evaluation, and the importance of remaining in the ED until their evaluation is complete.  Labs ordered   Dolphus Jenny, PA-C 09/03/23 1702

## 2023-09-03 NOTE — ED Notes (Signed)
Pt to ICU with RN

## 2023-09-03 NOTE — ED Notes (Signed)
 Phlebotomy stuck twice unable to collect labs

## 2023-09-03 NOTE — ED Provider Notes (Signed)
 [place] EMERGENCY DEPARTMENT AT [facility] Provider Note   CSN: [id number] Arrival date & time: 09/03/23  1609     History  Chief Complaint  Patient presents with   Abnormal Lab    [name] is a 68 y.o. female.   Abnormal Lab Patient reported sent in from nephrology after abnormal lab.  Patient and family do not know what the lab was however.  Reviewing blood work here it appears as if she has had hyponatremia and acute kidney injury.  Potassium of 6.6.  Recent admission for same at [facility] thought to be secondary to high output from her ostomy.    Patient's husband is here.  States she will get some confusion at times but states today is not really different than other days.  Although states he does not know if he would notice because he sees her so much.    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   Thyroid disease    Vertigo    Past Surgical History:  Procedure Laterality Date   ABDOMINAL ADHESION SURGERY  11/08/2020   Dr [name] - [facility]   ABDOMINAL HYSTERECTOMY  2003   [facility] - Dr [name] - for cervical cancer   BILE DUCT EXPLORATION  10/22/2003   CHOLECYSTECTOMY OPEN  10/22/2003   Dr Zachery Dakins - CBD repair w T tube   CYSTOSCOPY  2021   ILEOSTOMY  11/23/2018   ILEOSTOMY CLOSURE  04/2020   IR PATIENT EVAL TECH 0-60 MINS  06/20/2023   LOW ANTERIOR BOWEL RESECTION  11/23/2018   LAR/takedown colovaginal fistula/loop ileostomy  - [facility]     Home Medications Prior to Admission medications   Medication Sig Start Date End Date Taking? Authorizing Provider  acetaminophen (TYLENOL) 500 MG tablet Take 1,000 mg by mouth every 6 (six) hours as needed.    [provider]  Amino Acid Infusion in D15W (CLINIMIX/DEXTROSE, 5/15, IV) Inject 42 mLs into the vein See admin instructions. 42 ml per hour,  intravenously every 24 hours Patient not taking: Reported on 06/25/2023    [provider]  cetirizine (ZYRTEC) 10 MG tablet Take 10 mg by mouth daily. PRN    [provider]  divalproex (DEPAKOTE) 125 MG DR tablet Take 125 mg by mouth at bedtime.    [provider]  dronabinol (MARINOL) 2.5 MG capsule Take 2.5 mg by mouth 2 (two) times daily. Patient not taking: Reported on 06/25/2023    [provider]  Fat Emulsion Plant Based (FAT EMULSION 20 %, TPN/TNA, FOR ANES DOCUMENTATION) 20 % EMUL Inject 21 mLs into the vein continuous. 21 ml per hour every 24 hour (250 ml over 12 hours), once lipid bag is completed, disconnect lipid line, flush picc line with 5-10 ml saline flush. Patient not taking: Reported on 06/25/2023    [provider]  fluticasone (FLONASE) 50 MCG/ACT nasal spray Place 1 spray into both nostrils daily. 08/30/22   [provider]  hydrOXYzine (ATARAX) 25 MG tablet Take 25 mg by mouth every 8 (eight) hours as needed for anxiety.    [provider]  Lactobacillus Rhamnosus, GG, (CULTURELLE HEALTH & WELLNESS) CAPS Take 1 capsule by mouth daily. 05/21/23   [provider]  levothyroxine (SYNTHROID) 25 MCG tablet Take 1 tablet (25 mcg  total) by mouth daily. 07/17/22   [name], DO  mirtazapine (REMERON) 7.5 MG tablet Take 7.5 mg by mouth at bedtime.    [provider]  Multiple Vitamins-Minerals (MULTIVITAMIN) tablet Take 1 tablet by mouth daily.    [provider]  Multiple Vitamins-Minerals (ONCOVITE) TABS Take 1 tablet by mouth daily. 05/21/23   [provider]  mupirocin ointment (BACTROBAN) 2 % 1 Application 2 (two) times daily. To red area around insertion site for 7 days    [provider]  omeprazole (PRILOSEC) 20 MG capsule Take 20 mg by mouth in the morning. 05/21/23   [provider]  ondansetron (ZOFRAN) 4 MG tablet Take 4 mg by mouth every 6 (six) hours as  needed for nausea or vomiting. Patient not taking: Reported on 06/25/2023    [provider]  Ethelda Chick Calcium 500 MG TABS Take 1,250 mg by mouth daily. 05/21/23   [provider]  polyethylene glycol powder (GLYCOLAX/MIRALAX) 17 GM/SCOOP powder Take 1 Container by mouth once. Give 1 scoop by mouth every 24 hours as needed for constipation    [provider]  promethazine (PHENERGAN) 12.5 MG tablet Take 12.5 mg by mouth every 6 (six) hours as needed for nausea or vomiting. Patient not taking: Reported on 06/25/2023    [provider]  sertraline (ZOLOFT) 25 MG tablet Take 25 mg by mouth daily. Patient not taking: Reported on 06/25/2023    [provider]  sodium chloride 1 g tablet Take 1 g by mouth daily. For 3 days Patient not taking: Reported on 06/25/2023    [provider]      Allergies    Barium-containing compounds and Percocet [oxycodone-acetaminophen]    Review of Systems   Review of Systems  Physical Exam Updated Vital Signs BP 115/82   Pulse 74   Resp 17   Ht 5\' 2"  (1.575 m)   Wt 49 kg   SpO2 100%   BMI 19.76 kg/m  Physical Exam Vitals and nursing note reviewed.  Cardiovascular:     Rate and Rhythm: Regular rhythm.  Pulmonary:     Breath sounds: No wheezing or rhonchi.  Abdominal:     Comments: Right lower quadrant ostomy.  Also tube from right flank.  Neurological:     Mental Status: She is alert. Mental status is at baseline.     ED Results / Procedures / Treatments   Labs (all labs ordered are listed, but only abnormal results are displayed) Labs Reviewed  COMPREHENSIVE METABOLIC PANEL - Abnormal; Notable for the following components:      Result Value   Sodium 117 (*)    Potassium 6.6 (*)    Chloride 73 (*)    CO2 10 (*)    Glucose, Bld 255 (*)    BUN 169 (*)    Creatinine, Ser 10.15 (*)    ALT 54 (*)    Alkaline Phosphatase 195 (*)    GFR, Estimated 4 (*)    Anion gap 34 (*)    All other  components within normal limits  URINALYSIS, ROUTINE W REFLEX MICROSCOPIC - Abnormal; Notable for the following components:   APPearance TURBID (*)    Hgb urine dipstick SMALL (*)    Protein, ur 100 (*)    Leukocytes,Ua SMALL (*)    Bacteria, UA RARE (*)    All other components within normal limits  CBC WITH DIFFERENTIAL/PLATELET  CBC WITH DIFFERENTIAL/PLATELET  LACTIC ACID, PLASMA  LACTIC ACID,  PLASMA  SODIUM  SODIUM  SODIUM  SODIUM    EKG EKG Interpretation Date/Time:  Wednesday September 03 2023 18:45:11 EDT Ventricular Rate:  75 PR Interval:  138 QRS Duration:  95 QT Interval:  377 QTC Calculation: 421 R Axis:   47  Text Interpretation: Sinus rhythm Right atrial enlargement Peaked T waves Confirmed by Benjiman Core [phone]) on 09/03/2023 6:48:36 PM  Radiology No results found.  Procedures Procedures    Medications Ordered in ED Medications  sodium bicarbonate injection 50 mEq (has no administration in time range)  insulin aspart (novoLOG) injection 10 Units (has no administration in time range)  dextrose 50 % solution 50 mL (has no administration in time range)  sodium zirconium cyclosilicate (LOKELMA) packet 5 g (has no administration in time range)  0.9 %  sodium chloride infusion (has no administration in time range)    ED Course/ Medical Decision Making/ A&P                                 Medical Decision Making Risk Decision regarding hospitalization.   Patient brought in for laboratory abnormality.  Creatinine of 10.  Bicarb of 10.  Potassium is 6.6.  Sodium of 117. May have a mild amount of confusion although patient's husband states that she is likely at her baseline.  Reviewed recent lab work from outside hospital recent PCP note and recent discharge note.  Will discuss with nephrology.  Will require admission to hospital.  Discussed with Dr. Ronalee Belts.  He will come in.  Likely will need dialysis.  With potential mental status change and  need for high-level care.will discuss with intensivist.   CRITICAL CARE Performed by: Benjiman Core Total critical care time: 30 minutes Critical care time was exclusive of separately billable procedures and treating other patients. Critical care was necessary to treat or prevent imminent or life-threatening deterioration. Critical care was time spent personally by me on the following activities: development of treatment plan with patient and/or surrogate as well as nursing, discussions with consultants, evaluation of patient's response to treatment, examination of patient, obtaining history from patient or surrogate, ordering and performing treatments and interventions, ordering and review of laboratory studies, ordering and review of radiographic studies, pulse oximetry and re-evaluation of patient's condition.         Final Clinical Impression(s) / ED Diagnoses Final diagnoses:  Hyperkalemia  Hyponatremia  AKI (acute kidney injury) [facility])    Rx / DC Orders ED Discharge Orders     None         Benjiman Core, MD 09/03/23 1924

## 2023-09-03 NOTE — ED Triage Notes (Signed)
 Pt saw kidney doctor today at [facility]) and sent for abnormal labs but unsure of results. Pt endorses n/v for a few days. Reports looser stool in ostomy bag.

## 2023-09-03 NOTE — ED Triage Notes (Signed)
 The pts urine was a scant amount mild colored amybe 5ml  she has 2 bags connected in her abdomen she appears confused  and reports that she is nauseated

## 2023-09-04 ENCOUNTER — Inpatient Hospital Stay (HOSPITAL_COMMUNITY)

## 2023-09-04 ENCOUNTER — Encounter (HOSPITAL_COMMUNITY): Payer: Self-pay

## 2023-09-04 DIAGNOSIS — E871 Hypo-osmolality and hyponatremia: Secondary | ICD-10-CM

## 2023-09-04 DIAGNOSIS — G9340 Encephalopathy, unspecified: Secondary | ICD-10-CM

## 2023-09-04 DIAGNOSIS — E875 Hyperkalemia: Principal | ICD-10-CM

## 2023-09-04 DIAGNOSIS — E46 Unspecified protein-calorie malnutrition: Secondary | ICD-10-CM

## 2023-09-04 DIAGNOSIS — N179 Acute kidney failure, unspecified: Secondary | ICD-10-CM

## 2023-09-04 DIAGNOSIS — E8721 Acute metabolic acidosis: Secondary | ICD-10-CM | POA: Diagnosis not present

## 2023-09-04 DIAGNOSIS — R392 Extrarenal uremia: Secondary | ICD-10-CM

## 2023-09-04 DIAGNOSIS — K56609 Unspecified intestinal obstruction, unspecified as to partial versus complete obstruction: Secondary | ICD-10-CM

## 2023-09-04 LAB — CBC WITH DIFFERENTIAL/PLATELET
Abs Immature Granulocytes: 0.15 10*3/uL — ABNORMAL HIGH (ref 0.00–0.07)
Basophils Absolute: 0 10*3/uL (ref 0.0–0.1)
Basophils Relative: 0 %
Eosinophils Absolute: 0 10*3/uL (ref 0.0–0.5)
Eosinophils Relative: 0 %
Hemoglobin: 15.2 g/dL — ABNORMAL HIGH (ref 12.0–15.0)
Immature Granulocytes: 1 %
Lymphocytes Relative: 6 %
Lymphs Abs: 0.7 10*3/uL (ref 0.7–4.0)
Monocytes Absolute: 0.7 10*3/uL (ref 0.1–1.0)
Monocytes Relative: 6 %
Neutro Abs: 10.4 10*3/uL — ABNORMAL HIGH (ref 1.7–7.7)
Neutrophils Relative %: 87 %
Platelets: 334 10*3/uL (ref 150–400)
WBC: 11.9 10*3/uL — ABNORMAL HIGH (ref 4.0–10.5)
nRBC: 0 % (ref 0.0–0.2)

## 2023-09-04 LAB — BASIC METABOLIC PANEL
Anion gap: 30 — ABNORMAL HIGH (ref 5–15)
Anion gap: 23 — ABNORMAL HIGH (ref 5–15)
Anion gap: 19 — ABNORMAL HIGH (ref 5–15)
Anion gap: 16 — ABNORMAL HIGH (ref 5–15)
BUN: 171 mg/dL — ABNORMAL HIGH (ref 8–23)
BUN: 116 mg/dL — ABNORMAL HIGH (ref 8–23)
BUN: 100 mg/dL — ABNORMAL HIGH (ref 8–23)
BUN: 96 mg/dL — ABNORMAL HIGH (ref 8–23)
CO2: 18 mmol/L — ABNORMAL LOW (ref 22–32)
CO2: 21 mmol/L — ABNORMAL LOW (ref 22–32)
CO2: 20 mmol/L — ABNORMAL LOW (ref 22–32)
CO2: 23 mmol/L (ref 22–32)
Calcium: 8.5 mg/dL — ABNORMAL LOW (ref 8.9–10.3)
Calcium: 8.1 mg/dL — ABNORMAL LOW (ref 8.9–10.3)
Calcium: 7.8 mg/dL — ABNORMAL LOW (ref 8.9–10.3)
Calcium: 7.7 mg/dL — ABNORMAL LOW (ref 8.9–10.3)
Chloride: 73 mmol/L — ABNORMAL LOW (ref 98–111)
Chloride: 81 mmol/L — ABNORMAL LOW (ref 98–111)
Chloride: 84 mmol/L — ABNORMAL LOW (ref 98–111)
Chloride: 82 mmol/L — ABNORMAL LOW (ref 98–111)
Creatinine, Ser: 10.11 mg/dL — ABNORMAL HIGH (ref 0.44–1.00)
Creatinine, Ser: 6.97 mg/dL — ABNORMAL HIGH (ref 0.44–1.00)
Creatinine, Ser: 6.12 mg/dL — ABNORMAL HIGH (ref 0.44–1.00)
Creatinine, Ser: 5.6 mg/dL — ABNORMAL HIGH (ref 0.44–1.00)
GFR, Estimated: 4 mL/min — ABNORMAL LOW (ref >60)
GFR, Estimated: 6 mL/min — ABNORMAL LOW (ref >60)
GFR, Estimated: 7 mL/min — ABNORMAL LOW (ref >60)
GFR, Estimated: 8 mL/min — ABNORMAL LOW (ref >60)
Glucose, Bld: 83 mg/dL (ref 70–99)
Glucose, Bld: 144 mg/dL — ABNORMAL HIGH (ref 70–99)
Glucose, Bld: 198 mg/dL — ABNORMAL HIGH (ref 70–99)
Glucose, Bld: 250 mg/dL — ABNORMAL HIGH (ref 70–99)
Potassium: 5.5 mmol/L — ABNORMAL HIGH (ref 3.5–5.1)
Potassium: 4 mmol/L (ref 3.5–5.1)
Potassium: 3.7 mmol/L (ref 3.5–5.1)
Potassium: 3.7 mmol/L (ref 3.5–5.1)
Sodium: 121 mmol/L — ABNORMAL LOW (ref 135–145)
Sodium: 125 mmol/L — ABNORMAL LOW (ref 135–145)
Sodium: 123 mmol/L — ABNORMAL LOW (ref 135–145)
Sodium: 121 mmol/L — ABNORMAL LOW (ref 135–145)

## 2023-09-04 LAB — SODIUM
Sodium: 125 mmol/L — ABNORMAL LOW (ref 135–145)
Sodium: 120 mmol/L — ABNORMAL LOW (ref 135–145)
Sodium: 126 mmol/L — ABNORMAL LOW (ref 135–145)

## 2023-09-04 LAB — RENAL FUNCTION PANEL
Albumin: 3.4 g/dL — ABNORMAL LOW (ref 3.5–5.0)
Albumin: 2.9 g/dL — ABNORMAL LOW (ref 3.5–5.0)
Anion gap: 23 — ABNORMAL HIGH (ref 5–15)
Anion gap: 19 — ABNORMAL HIGH (ref 5–15)
BUN: 135 mg/dL — ABNORMAL HIGH (ref 8–23)
BUN: 75 mg/dL — ABNORMAL HIGH (ref 8–23)
CO2: 18 mmol/L — ABNORMAL LOW (ref 22–32)
CO2: 22 mmol/L (ref 22–32)
Calcium: 8 mg/dL — ABNORMAL LOW (ref 8.9–10.3)
Calcium: 7.8 mg/dL — ABNORMAL LOW (ref 8.9–10.3)
Chloride: 81 mmol/L — ABNORMAL LOW (ref 98–111)
Chloride: 82 mmol/L — ABNORMAL LOW (ref 98–111)
Creatinine, Ser: 7.69 mg/dL — ABNORMAL HIGH (ref 0.44–1.00)
Creatinine, Ser: 5.05 mg/dL — ABNORMAL HIGH (ref 0.44–1.00)
GFR, Estimated: 5 mL/min — ABNORMAL LOW (ref >60)
GFR, Estimated: 9 mL/min — ABNORMAL LOW (ref >60)
Glucose, Bld: 137 mg/dL — ABNORMAL HIGH (ref 70–99)
Glucose, Bld: 307 mg/dL — ABNORMAL HIGH (ref 70–99)
Phosphorus: 9.3 mg/dL — ABNORMAL HIGH (ref 2.5–4.6)
Phosphorus: 5.3 mg/dL — ABNORMAL HIGH (ref 2.5–4.6)
Potassium: 3.9 mmol/L (ref 3.5–5.1)
Potassium: 3.8 mmol/L (ref 3.5–5.1)
Sodium: 122 mmol/L — ABNORMAL LOW (ref 135–145)
Sodium: 123 mmol/L — ABNORMAL LOW (ref 135–145)

## 2023-09-04 LAB — CBC
HCT: 33.3 % — ABNORMAL LOW (ref 36.0–46.0)
HCT: 23.9 % — ABNORMAL LOW (ref 36.0–46.0)
Hemoglobin: 12.5 g/dL (ref 12.0–15.0)
Hemoglobin: 8.8 g/dL — ABNORMAL LOW (ref 12.0–15.0)
MCH: 33.1 pg (ref 26.0–34.0)
MCH: 33.5 pg (ref 26.0–34.0)
MCHC: 37.5 g/dL — ABNORMAL HIGH (ref 30.0–36.0)
MCHC: 36.8 g/dL — ABNORMAL HIGH (ref 30.0–36.0)
MCV: 88.1 fL (ref 80.0–100.0)
MCV: 90.9 fL (ref 80.0–100.0)
Platelets: 339 10*3/uL (ref 150–400)
Platelets: 178 10*3/uL (ref 150–400)
RBC: 3.78 MIL/uL — ABNORMAL LOW (ref 3.87–5.11)
RBC: 2.63 MIL/uL — ABNORMAL LOW (ref 3.87–5.11)
RDW: 17.6 % — ABNORMAL HIGH (ref 11.5–15.5)
RDW: 17.8 % — ABNORMAL HIGH (ref 11.5–15.5)
WBC: 11.9 10*3/uL — ABNORMAL HIGH (ref 4.0–10.5)
WBC: 10 10*3/uL (ref 4.0–10.5)
nRBC: 0 % (ref 0.0–0.2)
nRBC: 0 % (ref 0.0–0.2)

## 2023-09-04 LAB — PHOSPHORUS: Phosphorus: >30 mg/dL — ABNORMAL HIGH (ref 2.5–4.6)

## 2023-09-04 LAB — GLUCOSE, CAPILLARY
Glucose-Capillary: 88 mg/dL (ref 70–99)
Glucose-Capillary: 152 mg/dL — ABNORMAL HIGH (ref 70–99)
Glucose-Capillary: 129 mg/dL — ABNORMAL HIGH (ref 70–99)
Glucose-Capillary: 242 mg/dL — ABNORMAL HIGH (ref 70–99)
Glucose-Capillary: 306 mg/dL — ABNORMAL HIGH (ref 70–99)
Glucose-Capillary: 156 mg/dL — ABNORMAL HIGH (ref 70–99)

## 2023-09-04 LAB — LACTIC ACID, PLASMA
Lactic Acid, Venous: 1.8 mmol/L (ref 0.5–1.9)
Lactic Acid, Venous: 4 mmol/L (ref 0.5–1.9)

## 2023-09-04 LAB — HEMOGLOBIN A1C
Hgb A1c MFr Bld: 5.7 % — ABNORMAL HIGH (ref 4.8–5.6)
Mean Plasma Glucose: 116.89 mg/dL

## 2023-09-04 LAB — VITAMIN D 25 HYDROXY (VIT D DEFICIENCY, FRACTURES): Vit D, 25-Hydroxy: 17.33 ng/mL — ABNORMAL LOW (ref 30–100)

## 2023-09-04 LAB — MRSA NEXT GEN BY PCR, NASAL: MRSA by PCR Next Gen: NOT DETECTED

## 2023-09-04 LAB — MAGNESIUM
Magnesium: 1.4 mg/dL — ABNORMAL LOW (ref 1.7–2.4)
Magnesium: 1.5 mg/dL — ABNORMAL LOW (ref 1.7–2.4)

## 2023-09-04 LAB — VITAMIN B12: Vitamin B-12: 336 pg/mL (ref 180–914)

## 2023-09-04 LAB — C-REACTIVE PROTEIN: CRP: 0.8 mg/dL (ref <1.0)

## 2023-09-04 LAB — CG4 I-STAT (LACTIC ACID): Lactic Acid, Venous: 6.6 mmol/L (ref 0.5–1.9)

## 2023-09-04 LAB — PROCALCITONIN: Procalcitonin: 0.82 ng/mL

## 2023-09-04 LAB — FOLATE: Folate: 16.9 ng/mL (ref >5.9)

## 2023-09-04 MED ORDER — INSULIN ASPART 100 UNIT/ML IJ SOLN
0.0000 [IU] | INTRAMUSCULAR | Status: DC
  Administered 2023-09-04: 7 [IU] via SUBCUTANEOUS
  Administered 2023-09-04: 2 [IU] via SUBCUTANEOUS
  Administered 2023-09-05: 1 [IU] via SUBCUTANEOUS
  Administered 2023-09-05: 3 [IU] via SUBCUTANEOUS

## 2023-09-04 MED ORDER — ADULT MULTIVITAMIN W/MINERALS CH
1.0000 | ORAL_TABLET | Freq: Every day | ORAL | Status: DC
  Administered 2023-09-04 – 2023-09-16 (×12): 1 via ORAL
  Filled 2023-09-04 (×12): qty 1

## 2023-09-04 MED ORDER — NOREPINEPHRINE 4 MG/250ML-% IV SOLN
0.0000 ug/min | INTRAVENOUS | Status: DC
  Administered 2023-09-04: 10 ug/min via INTRAVENOUS
  Administered 2023-09-04: 16 ug/min via INTRAVENOUS
  Administered 2023-09-04: 15 ug/min via INTRAVENOUS
  Administered 2023-09-05 (×2): 12 ug/min via INTRAVENOUS
  Administered 2023-09-06: 9 ug/min via INTRAVENOUS
  Administered 2023-09-06: 6 ug/min via INTRAVENOUS
  Administered 2023-09-06: 9 ug/min via INTRAVENOUS
  Administered 2023-09-07: 2 ug/min via INTRAVENOUS
  Filled 2023-09-04 (×4): qty 250
  Filled 2023-09-04: qty 500
  Filled 2023-09-04 (×4): qty 250

## 2023-09-04 MED ORDER — ALBUMIN HUMAN 5 % IV SOLN
25.0000 g | Freq: Once | INTRAVENOUS | Status: AC
  Administered 2023-09-04: 12.5 g via INTRAVENOUS
  Filled 2023-09-04: qty 500

## 2023-09-04 MED ORDER — FOLIC ACID 1 MG PO TABS
1.0000 mg | ORAL_TABLET | Freq: Every day | ORAL | Status: DC
  Administered 2023-09-04 – 2023-09-18 (×14): 1 mg via ORAL
  Filled 2023-09-04 (×14): qty 1

## 2023-09-04 MED ORDER — DEXTROSE 5 % IV SOLN: INTRAVENOUS | Status: DC

## 2023-09-04 MED ORDER — SODIUM CHLORIDE 0.9% FLUSH
10.0000 mL | Freq: Two times a day (BID) | INTRAVENOUS | Status: DC
  Administered 2023-09-04: 20 mL
  Administered 2023-09-04 – 2023-09-05 (×2): 10 mL
  Administered 2023-09-05: 40 mL
  Administered 2023-09-06 (×2): 10 mL
  Administered 2023-09-07: 30 mL
  Administered 2023-09-07 – 2023-09-13 (×12): 10 mL

## 2023-09-04 MED ORDER — CHLORHEXIDINE GLUCONATE CLOTH 2 % EX PADS
6.0000 | MEDICATED_PAD | Freq: Every day | CUTANEOUS | Status: DC
  Administered 2023-09-04 – 2023-09-15 (×12): 6 via TOPICAL

## 2023-09-04 MED ORDER — PRISMASOL BGK 4/2.5 32-4-2.5 MEQ/L EC SOLN: Status: DC

## 2023-09-04 MED ORDER — ENSURE ENLIVE PO LIQD
237.0000 mL | Freq: Three times a day (TID) | ORAL | Status: DC
  Administered 2023-09-05 – 2023-09-18 (×29): 237 mL via ORAL

## 2023-09-04 MED ORDER — THIAMINE HCL 100 MG/ML IJ SOLN
100.0000 mg | Freq: Every day | INTRAMUSCULAR | Status: AC
  Administered 2023-09-04 – 2023-09-08 (×5): 100 mg via INTRAVENOUS
  Filled 2023-09-04 (×5): qty 2

## 2023-09-04 MED ORDER — SODIUM CHLORIDE 0.9% FLUSH: 10.0000 mL | INTRAVENOUS | Status: DC | PRN

## 2023-09-04 MED ORDER — MAGNESIUM SULFATE 2 GM/50ML IV SOLN
2.0000 g | Freq: Once | INTRAVENOUS | Status: AC
  Administered 2023-09-04: 2 g via INTRAVENOUS
  Filled 2023-09-04: qty 50

## 2023-09-04 MED ORDER — DEXMEDETOMIDINE HCL IN NACL 400 MCG/100ML IV SOLN
0.0000 ug/kg/h | INTRAVENOUS | Status: DC
  Administered 2023-09-04: 0.6 ug/kg/h via INTRAVENOUS
  Administered 2023-09-04: 0.4 ug/kg/h via INTRAVENOUS
  Administered 2023-09-05: 0.5 ug/kg/h via INTRAVENOUS
  Filled 2023-09-04 (×3): qty 100

## 2023-09-04 MED ORDER — LIDOCAINE HCL (PF) 1 % IJ SOLN
5.0000 mL | Freq: Once | INTRAMUSCULAR | Status: AC
  Administered 2023-09-04: 5 mL
  Filled 2023-09-04: qty 5

## 2023-09-04 MED ORDER — VANCOMYCIN HCL 500 MG/100ML IV SOLN
500.0000 mg | INTRAVENOUS | Status: DC
  Administered 2023-09-05 – 2023-09-06 (×2): 500 mg via INTRAVENOUS
  Filled 2023-09-04 (×2): qty 100

## 2023-09-04 NOTE — Progress Notes (Signed)
 Physical Therapy Evaluation Patient Details Name: [name] MRN: [id number] DOB: 04-Oct-1955 Today's Date: 09/04/2023  History of Present Illness  68 y.o. female presents to [facility] 09/03/23 from [facility] due to abnormal lab work. Pt admitted with acute kidney failure, acidosis, and acute encephalopathy. Suspected uretal obstruction vs. Severe dehydration vs. Urosepsis etiology. 3/20 began CRRT. PMHx: atrophic kidney, cancer, colovaginal fistula, pyelonephritis, renal atrophy, vertigo   Clinical Impression  Pt in bed upon arrival and agreeable to PT eval. Pt was lethargic/confused throughout the session and required increased time and frequent cues to follow commands and answer questions. Pt reported being independent with no AD for mobility and ADLs. Chart review from 12/25 stated that pt would use a RW to ambulate and needed assistance with ADLs. Pt was able to move from supine/sit with ModAx2 and sit EOB with MinA to CGA. Pt had a R lateral lean, however, was able to correct with tactile/verbal cues. Pt was able to sit for ~10 minutes before needing to return to supine. Pt currently with functional limitations due to the deficits listed below (see PT Problem List). Pt would benefit from acute skilled PT to address functional impairments. Recommending post-acute rehab <3hrs to work towards independence with mobility. Acute PT to follow.         If plan is discharge home, recommend the following: A lot of help with walking and/or transfers;A lot of help with bathing/dressing/bathroom;Assistance with cooking/housework;Assist for transportation;Help with stairs or ramp for entrance   Can travel by private vehicle   No    Equipment Recommendations None recommended by PT     Functional Status Assessment Patient has had a recent decline in their functional status and demonstrates the ability to make significant improvements in function in a reasonable and predictable amount of time.      Precautions / Restrictions Precautions Precautions: Fall Recall of Precautions/Restrictions: Impaired Precaution/Restrictions Comments: R nephrostomy, R UQ ileostomy, IJ CRRT Restrictions Weight Bearing Restrictions Per Provider Order: No      Mobility  Bed Mobility Overal bed mobility: Needs Assistance Bed Mobility: Supine to Sit, Sit to Supine     Supine to sit: Mod assist, +2 for physical assistance Sit to supine: Min assist   General bed mobility comments: pt lethargic upon arrival and had difficulty following commands, able to move LE's towards EOB with increased time. ModAx2 to complete movement and raise trunk. Able to return to supine with MinA to slightly assist with LE management. TotalAx2 to scoot towards HOB in supine    Transfers    General transfer comment: deferred 2/2 lethargy     Balance Overall balance assessment: Needs assistance, Mild deficits observed, not formally tested Sitting-balance support: Feet supported, Bilateral upper extremity supported Sitting balance-Leahy Scale: Poor Sitting balance - Comments: R lateral lean requiring MinA to CGA to support. Able to correct wtih cues and slight assist. Sat EOB for ~15 minutes while answering questions Postural control: Right lateral lean          Home Living Family/patient expects to be discharged to:: Private residence Living Arrangements: Spouse/significant other Available Help at Discharge: Family;Available 24 hours/day Type of Home: Apartment Home Access: Stairs to enter Entrance Stairs-Rails:  (unsure) Entrance Stairs-Number of Steps: 4   Home Layout: Two level Home Equipment: Agricultural consultant (2 wheels)      Prior Function Prior Level of Function : Independent/Modified Independent    Mobility Comments: pt reports being independent with no AD, noted in chart review that pt used  a RW on 1/15 ADLs Comments: pt reports being ind. for ADLs, per chart review, pt was needing assistance for  bathing, dressing, and toileting in 12/25     Extremity/Trunk Assessment   Upper Extremity Assessment Upper Extremity Assessment: Defer to OT evaluation    Lower Extremity Assessment Lower Extremity Assessment: Generalized weakness;Difficult to assess due to impaired cognition    Cervical / Trunk Assessment Cervical / Trunk Assessment: Kyphotic  Communication   Communication Communication: Impaired Factors Affecting Communication: Reduced clarity of speech    Cognition Arousal: Lethargic Behavior During Therapy: Flat affect, Restless   PT - Cognitive impairments: No family/caregiver present to determine baseline, Difficult to assess, Orientation, Problem solving, Safety/Judgement   Orientation impairments: Place, Time, Situation      PT - Cognition Comments: pt lethargic throughout session, able to answer a few PLOF questions. Would mumble with difficulty following conversation Following commands: Impaired Following commands impaired: Follows one step commands inconsistently, Follows one step commands with increased time     Cueing Cueing Techniques: Verbal cues, Tactile cues      PT Assessment Patient needs continued PT services  PT Problem List Decreased strength;Decreased range of motion;Decreased activity tolerance;Decreased balance;Decreased mobility;Decreased cognition;Decreased knowledge of use of DME;Decreased safety awareness       PT Treatment Interventions DME instruction;Gait training;Stair training;Functional mobility training;Therapeutic activities;Therapeutic exercise;Balance training;Neuromuscular re-education;Patient/family education;Wheelchair mobility training    PT Goals (Current goals can be found in the Care Plan section)  Acute Rehab PT Goals Patient Stated Goal: to get better PT Goal Formulation: With patient Time For Goal Achievement: 09/18/23 Potential to Achieve Goals: Good    Frequency Min 2X/week        AM-PAC PT "6 Clicks"  Mobility  Outcome Measure Help needed turning from your back to your side while in a flat bed without using bedrails?: A Little Help needed moving from lying on your back to sitting on the side of a flat bed without using bedrails?: Total Help needed moving to and from a bed to a chair (including a wheelchair)?: Total Help needed standing up from a chair using your arms (e.g., wheelchair or bedside chair)?: Total Help needed to walk in hospital room?: Total Help needed climbing 3-5 steps with a railing? : Total 6 Click Score: 8    End of Session   Activity Tolerance: Patient limited by lethargy Patient left: in bed;with call bell/phone within reach;with nursing/sitter in room Nurse Communication: Mobility status;Other (comment) (RN present during session) PT Visit Diagnosis: Other abnormalities of gait and mobility (R26.89);Muscle weakness (generalized) (M62.81)    Time: 0865-7846 PT Time Calculation (min) (ACUTE ONLY): 16 min   Charges:   PT Evaluation $PT Eval Moderate Complexity: 1 Mod   PT General Charges $$ ACUTE PT VISIT: 1 Visit        Hilton Cork, PT, DPT Secure Chat Preferred  Rehab Office [phone]   Arturo Morton Brion Aliment 09/04/2023, 10:08 AM

## 2023-09-04 NOTE — Progress Notes (Signed)
 Pharmacy Antibiotic Note  [name] is a 68 y.o. female admitted on 09/03/2023 with  acute renal failure .  Pharmacy has been consulted for Vancomycin/Zosyn dosing for rule out sepsis. Pt is starting CRRT.  Plan: Vancomycin 1000 mg IV x 1, then 500 mg IV q24h  Zosyn 3.375g IV q6h F/U CRRT tolerance and plans for any anti-biotics adjustments   Height: 5\' 2"  (157.5 cm) Weight: 49 kg (108 lb 0.4 oz) IBW/kg (Calculated) : 50.1  Temp (24hrs), Avg:97.3 F (36.3 C), Min:97.3 F (36.3 C), Max:97.3 F (36.3 C)  Recent Labs  Lab 09/03/23 1703 09/03/23 2031 09/03/23 2220 09/04/23 0001  WBC  --   --  11.9* 11.9*  CREATININE 10.15* 10.34*  --  10.11*  LATICACIDVEN  --  3.6*  --  1.8    Estimated Creatinine Clearance: 4.2 mL/min (A) (by C-G formula based on SCr of 10.11 mg/dL (H)).    Allergies  Allergen Reactions   Barium-Containing Compounds     hives   Percocet [Oxycodone-Acetaminophen] Itching and Other (See Comments)    migraine     Abran Duke, PharmD, BCPS Clinical Pharmacist Phone: [phone]

## 2023-09-04 NOTE — Evaluation (Signed)
 Speech Language Pathology Evaluation Patient Details Name: [name] MRN: [id number] DOB: [birth year] Today's Date: 09/04/2023 Time: 1030-1105 SLP Time Calculation (min) (ACUTE ONLY): 35 min  Problem List:  Patient Active Problem List   Diagnosis Date Noted   Hyperkalemia 09/04/2023   Acute kidney injury (HCC) 09/03/2023   Tremors of nervous system 06/10/2023   Hyponatremia 06/09/2023   Failure to thrive in adult 06/09/2023   Nausea & vomiting 06/09/2023   Transaminitis 06/09/2023   Metabolic acidosis with normal anion gap and bicarbonate losses 06/09/2023   Bilateral hydronephrosis 06/09/2023   Subacute confusional state 11/24/2022   Memory loss 11/24/2022   Hypokalemia 11/19/2022   Renal atrophy, left 11/19/2022   Acute-on-chronic kidney injury (HCC) 11/18/2022   SBO (small bowel obstruction) (HCC) 11/17/2022   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   Seborrheic dermatitis 11/04/2022   Senile purpura (HCC) 11/04/2022   Telogen effluvium 11/04/2022   Neck pain 05/07/2022   Right ear pain 05/07/2022   Tympanic membrane perforation, marginal, right 02/27/2022   CKD (chronic kidney disease) stage 3, GFR 30-59 ml/min (HCC) 05/24/2021   Dysuria 04/16/2021   Vaginal irritation 04/16/2021   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   Imbalance 04/10/2021   Tinnitus aurium, right 04/10/2021   Non-recurrent acute serous otitis media of right ear 12/13/2020   Unspecified intestinal obstruction, unspecified as to partial versus complete obstruction (HCC) 10/27/2020   Constipation 10/25/2020   Nausea 10/25/2020   Hypothyroidism 06/24/2018   Smoker 06/24/2018   History of cervical cancer 06/24/2018   Mixed hyperlipidemia 06/24/2018   Essential tremor 05/08/2018   Chronic daily headache 05/06/2018   Lumbar spine painful on movement 05/06/2018   Vertigo 05/06/2018   Leg cramps 05/06/2018   Allergic rhinitis 04/09/2017   Anxiety 04/09/2017   Need for immunization  against influenza 04/09/2017   Annual physical exam 04/09/2017   Past Medical History:  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   Thyroid disease    Vertigo    Past Surgical History:  Past Surgical History:  Procedure Laterality Date   ABDOMINAL ADHESION SURGERY  11/08/2020   Dr [name] - [facility]   ABDOMINAL HYSTERECTOMY  2003   [facility] - Dr [name] - for cervical cancer   BILE DUCT EXPLORATION  10/22/2003   CHOLECYSTECTOMY OPEN  10/22/2003   Dr Zachery Dakins - CBD repair w T tube   CYSTOSCOPY  2021   ILEOSTOMY  11/23/2018   ILEOSTOMY CLOSURE  04/2020   IR PATIENT EVAL TECH 0-60 MINS  06/20/2023   LOW ANTERIOR BOWEL RESECTION  11/23/2018   LAR/takedown colovaginal fistula/loop ileostomy  - [facility]   HPI:  Pt is a 68 yr female with a significant past medical history for cervical cancer 2003 s/p hysterectomy, hx of TPN use/malnutrition, right hydronephrosis s/p uretal stent 03/2023 and R nephrostomy tube placed in 04/2023 due to hospitalization at [facility] for obstructive uropathy AKI and BL hydronephrosis complicated by Enterobacter and Candida UTI, rectovaginal s/p anterior resection with colovaginal fistula repair, loop ileostomy with closure in 2021, SBO s/p with lysis of adhesions, recurrent SBO, ileum/cecum resection with ileostomy creation 2024, and recent [facility] hospitalization (2/14-2/23/25) which revealed patient had severe dehydration in the setting of high output and poor oral intake who presents to [facility] ED from [facility] on 09/03/23  due to abnormal lab work indicating acute renal failure. Pt admitted with altered mental status; Speech/language cognitive assessment requested.   Assessment / Plan / Recommendation Clinical Impression  Pt seen for speech/language cognitive assessment with informal measures, observations,  husband/nursing report and portions of [employer] Mental Status Examination (SLUMS) without a total score obtained.  Deficit areas included: orientation (pt oriented to self only), short-term memory, memory retrieval, retrieval of new information and decreased sustained attention.  Pt would lose train of thought mid-sentence and occasionally self-correct when word choice was unable to be obtained during conversation.  Pt followed simple 1-step directions, but deteriorated with 2-step commands within functional tasks.  Pt attempting to remove tubes/lines during evaluation with verbal cues required to redirect behavior, thus impacting problem solving skills. Speech intelligible within simple conversation, but oral mech examination unable to be completed d/t pt refusal.  Husband informed SLP pt had baseline STM deficits c/b occasional forgetfulness, occasionally forgetting how to use common objects within household (ZO:[id number], phone) and had also ceased driving d/t safety concerns.  Previously, pt required A with certain ADLs and no longer prepares meals.  Husband also stated pt has become increasingly agitated/confrontational at home with him.  Discussed compensatory strategies with husband to A with attention/memory deficits and goals for ST in acute setting.  Recommend continue ST for cog/linguistic deficits to return pt to baseline level of functioning.  Thank you for this consult.    SLP Assessment  SLP Recommendation/Assessment: Patient needs continued Speech Language Pathology Services SLP Visit Diagnosis: Cognitive communication deficit (R41.841)    Recommendations for follow up therapy are one component of a multi-disciplinary discharge planning process, led by the attending physician.  Recommendations may be updated based on patient status, additional functional criteria and insurance authorization.    Follow Up Recommendations  Other (comment) (TBD)    Assistance Recommended at  Discharge  Frequent or constant Supervision/Assistance  Functional Status Assessment Patient has had a recent decline in their functional status and demonstrates the ability to make significant improvements in function in a reasonable and predictable amount of time.  Frequency and Duration min 2x/week  1 week      SLP Evaluation Cognition  Overall Cognitive Status: Impaired/Different from baseline Arousal/Alertness: Awake/alert Orientation Level: Oriented to person;Disoriented to place;Disoriented to time;Disoriented to situation (pt able to indicate "hospital" with cues provided, but otherwise, she was unable to indicate place) Year: 2020 Month: March Day of Week: Incorrect Attention: Sustained Sustained Attention: Impaired Sustained Attention Impairment: Verbal basic;Functional basic Memory: Impaired Memory Impairment: Retrieval deficit;Decreased short term memory;Decreased recall of new information Decreased Short Term Memory: Verbal basic;Functional basic Awareness: Impaired Awareness Impairment: Other (comment) (pt attempting to remove lines/required mitts if unsupervised) Problem Solving: Impaired Problem Solving Impairment: Verbal basic;Functional basic Behaviors: Impulsive;Perseveration Safety/Judgment: Impaired Comments: attempting to remove lines/tubes during session       Comprehension  Auditory Comprehension Overall Auditory Comprehension: Impaired Commands: Impaired Two Step Basic Commands: 50-74% accurate Conversation: Simple Other Conversation Comments: perseveration Interfering Components: Attention;Other (comment) (baseline memory deficits) EffectiveTechniques: Repetition Visual Recognition/Discrimination Discrimination: Not tested Reading Comprehension Reading Status: Not tested    Expression Expression Primary Mode of Expression: Verbal Verbal Expression Overall Verbal Expression: Impaired Level of Generative/Spontaneous Verbalization:  Sentence Naming: Impairment Responsive: Not tested Confrontation: Not tested Verbal Errors: Perseveration;Other (comment) (intermittently aware of errors, would self-correct occasionally) Pragmatics: Unable to assess Interfering Components: Attention;Premorbid deficit Non-Verbal Means of Communication: Not applicable Written Expression Written Expression: Not tested   Oral / Motor  Oral Motor/Sensory Function Overall Oral Motor/Sensory Function: Other (comment) (appears adequate, but pt not cooperative for OME) Motor Speech Overall Motor Speech: Appears within functional limits for tasks assessed Respiration: Within functional limits Phonation: Normal Resonance: Within functional limits Articulation: Within functional limitis Intelligibility: Intelligible Motor Planning: Witnin functional limits Motor Speech Errors: Not applicable Interfering Components: Premorbid status            Pat Rohnan Bartleson,M.S.,CCC-SLP 09/04/2023, 2:40 PM

## 2023-09-04 NOTE — Consult Note (Addendum)
 WOC Nurse ostomy consult note; patient had loop ileostomy originally placed 11/2018 at [facility] with closure 04/2020 and apparent redo of loop ileostomy in 2024; patient has been seen by ostomy nurse here at [facility] last 05/2023 Stoma type/location: RLQ loop ileostomy  Stomal assessment/size:  1" pink moist at time of last assessment Peristomal assessment:  noted to be in a crease  Treatment options for stomal/peristomal skin: 2" barrier ring [facility] [phone])  Output  Ostomy pouching: patient utilizing 2 1/4" 2 piece convex system per last WOC note at [facility] and supply notes from [facility]  Education provided:  none, established ostomy  Enrolled patient in [employer] DC program: previously, obtains supplies through [employer] it appears   Will place orders for 2 1/4" soft convex 2 piece with option for high output pouch.     2" barrier ring placed snugly around stoma [facility] [phone]), 2 1/4" soft convex skin barrier [facility] [phone]), 2 1/4" standard pouch [facility] [phone]) OR 2 1/4" high output pouch if having liquid stool output [facility] [phone]).  If patient wears an ostomy belt can be obtained from materials with [facility] [phone].   If skin is broken down or weeping around stoma at pouch change should crust with stoma powder [facility] #6) and no sting barrier wipes (blue and white packet) as follows:  Sprinkle stoma powder over any irritated skin, brush away excess powder Tap lightly over the powder with Cavilon No-Sting barrier wipe (skin barrier wipe-white and blue package) Allow to dry Apply another layer of powder following the same steps up to three layers.  After dry proceed with routine pouching    Please use no sting barrier wipe to skin around stoma, allow to dry then place 2" barrier ring snugly around stoma before applying skin barrier that has been cut to fit.  If using high output pouch attach to a foley bag for bedside drainage of liquid output. If stool is not  liquid high output pouch can not be placed to bedside drainage.    Patient also noted to have nephrostomy tube, standard care with cleaning around tube with NS and applying split gauze should suffice.    Discussed above with bedside nurse. WOC team will not follow this established ostomy. Re-consult if further needs arise.   Thank you,     Priscella Mann MSN, RN-BC, [employer] [phone]

## 2023-09-04 NOTE — Evaluation (Signed)
 Clinical/Bedside Swallow Evaluation Patient Details  Name: [name] MRN: [id number] Date of Birth: 1955/07/08  Today's Date: 09/04/2023 Time: SLP Start Time (ACUTE ONLY): 1030 SLP Stop Time (ACUTE ONLY): 1105 SLP Time Calculation (min) (ACUTE ONLY): 35 min  Past Medical History:  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   Thyroid disease    Vertigo    Past Surgical History:  Past Surgical History:  Procedure Laterality Date   ABDOMINAL ADHESION SURGERY  11/08/2020   Dr [name] - [facility]   ABDOMINAL HYSTERECTOMY  2003   [facility] - Dr [name] - for cervical cancer   BILE DUCT EXPLORATION  10/22/2003   CHOLECYSTECTOMY OPEN  10/22/2003   Dr Zachery Dakins - CBD repair w T tube   CYSTOSCOPY  2021   ILEOSTOMY  11/23/2018   ILEOSTOMY CLOSURE  04/2020   IR PATIENT EVAL TECH 0-60 MINS  06/20/2023   LOW ANTERIOR BOWEL RESECTION  11/23/2018   LAR/takedown colovaginal fistula/loop ileostomy  - [id number]   HPI:  Pt is a 68 yr female with a significant past medical history for cervical cancer 2003 s/p hysterectomy, hx of TPN use/malnutrition, right hydronephrosis s/p uretal stent 03/2023 and R nephrostomy tube placed in 04/2023 due to hospitalization at [facility] for obstructive uropathy AKI and BL hydronephrosis complicated by Enterobacter and Candida UTI,  rectovaginal s/p anterior resection with colovaginal fistula repair, loop ileostomy with closure in 2021, SBO s/p with lysis of adhesions, recurrent SBO, ileum/cecum resection with ileostomy creation 2024, and recent [facility] hospitalization (2/14-2/23/25) which revealed patient had severe dehydration in the setting of high output and poor oral intake who presents to [facility] ED from [facility] on 09/03/23 due to abnormal lab work indicating acute renal failure.  Pt passed swallow screen, but d/t  confusion, MD requesting clinical swallow evaluation.    Assessment / Plan / Recommendation  Clinical Impression  Pt seen for clinical swallow evaluation with cognitive-based dysphagia impacting cumulative swallow.  Pt with slight oral holding, prolonged oral transit without min-mod verbal cues provided by SLP with puree/soft solids d/t pt attempting to speak during consumption.  Pt able to self feed, but required A with set-up for safe consumption.  Husband present and noted pt able to eat/drink all consistencies prior to hospitalization without difficulty noted, but she did exhibit limited satiety/appetite.  No overt s/s of aspiration present throughout session, but decreased sustained attention/impaired cognition affected intake intermittently with min-mod cues required to maintain safety/efficiency of swallow as pt would speak during intake or take larger bites/sips without cueing.  Recommend initiating a Dysphagia 3(chopped)/thin liquid diet with posted swallowing precautions and FULL supervision during meals/snacks for safety purposes/ensuring swallow precautions utilized during meals.  Medications provided whole with small sips and/or puree.  ST will f/u in acute setting for dysphagia tx/management.  Thank you for this consult. SLP Visit Diagnosis: Dysphagia, unspecified (R13.10)    Aspiration Risk  Mild aspiration risk;Moderate aspiration risk    Diet Recommendation   Thin;Dysphagia 3 (mechanical soft)  Medication Administration: Whole meds with liquid (small sips or with puree)    Other  Recommendations Oral Care Recommendations: Oral care BID    Recommendations for follow up therapy are one component of a multi-disciplinary discharge planning process, led by the attending physician.  Recommendations may be updated  based on patient status, additional functional criteria and insurance authorization.  Follow up Recommendations Follow physician's recommendations for discharge plan and follow  up therapies      Assistance Recommended at Discharge  TBD  Functional Status Assessment Patient has had a recent decline in their functional status and demonstrates the ability to make significant improvements in function in a reasonable and predictable amount of time.  Frequency and Duration min 2x/week  1 week       Prognosis Prognosis for improved oropharyngeal function: Good      Swallow Study   General Date of Onset: 09/03/23 HPI: Pt is a 68 yr female with a significant past medical history for cervical cancer 2003 s/p hysterectomy, hx of TPN use/malnutrition, right hydronephrosis s/p uretal stent 03/2023 and R nephrostomy tube placed in 04/2023 due to hospitalization at [facility] for obstructive uropathy AKI and BL hydronephrosis complicated by Enterobacter and Candida UTI,  rectovaginal s/p anterior resection with colovaginal fistula repair, loop ileostomy with closure in 2021, SBO s/p with lysis of adhesions, recurrent SBO, ileum/cecum resection with ileostomy creation 2024, and recent [facility] hospitalization (2/14-2/23/25) which revealed patient had severe dehydration in the setting of high output and poor oral intake who presents to [facility] ED from [facility] on 09/03/23 due to abnormal lab work indicating acute renal failure.  Pt passed swallow screen, but d/t confusion, MD requesting clinical swallow evaluation. Type of Study: Bedside Swallow Evaluation Previous Swallow Assessment: [facility] swallow screen passed on 2023/09/26, but MD requesting full clinical swallow evaluation d/t pt confusion/decreased mentation Diet Prior to this Study: NPO Temperature Spikes Noted: No Respiratory Status: Room air History of Recent Intubation: No Behavior/Cognition: Alert;Distractible;Confused Oral Cavity Assessment: Within Functional Limits Oral Care Completed by SLP: Recent completion by staff Oral Cavity - Dentition: Adequate natural dentition Self-Feeding Abilities: Able to feed  self;Needs assist Patient Positioning: Upright in bed Baseline Vocal Quality: Normal Volitional Cough: Strong Volitional Swallow: Able to elicit    Oral/Motor/Sensory Function Overall Oral Motor/Sensory Function: Other (comment) (appears adequate, but pt with min cooperation for all tasks)   [place] chips: Not tested   Thin Liquid Thin Liquid: Impaired Presentation: Straw;Self Fed Oral Phase Impairments: Other (comment) (slight oral holding/decreased sustained attention) Oral Phase Functional Implications: Oral holding    Nectar Thick Nectar Thick Liquid: Not tested   Honey Thick Honey Thick Liquid: Not tested   Puree Puree: Impaired Presentation: Self Fed Oral Phase Functional Implications: Oral holding Other Comments: Decreased sustained attention intermittently impacted; required min-mod cues for sustaining attention   Solid     Solid: Impaired Presentation: Self Fed Oral Phase Impairments: Impaired mastication Oral Phase Functional Implications: Impaired mastication;Other (comment) (decreased sustained attention)      Pat Alando Colleran,M.S.,CCC-SLP 09/26/2023,2:12 PM

## 2023-09-04 NOTE — Progress Notes (Signed)
 [name] is an 68 y.o. 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  06/2023 right-sided PCN  + ureteral stent. The patient went to [facility] for initial consult of CKD when she was found to have critical lab results and was advised to go to ER. Sodium level 117, potassium 6.6, CO2 10, BUN 169, creatinine level 10.15, anion gap 34, hemoglobin 9.  UA turbid with a chronic proteinuria  Assessment/Plan: # Acute kidney injury concerning for obstructive uropathy and may have ischemic ATN with reduced intravascular volume.  Patient with history of bilateral hydronephrosis and has right nephrostomy tube.  Now with severe metabolic derangement with hyperkalemia, severe acidosis and uremic. CT AP atrophic LK, no hydro on rt.   Continue CRRT with low blood flow rate and low dialysate flow rate because of very high BUN and hyponatremia. DDAVP was given before RIJ temp cath placement.  Maintain positive fluid balance of 100 cc an hour; Change to 4K bath (dialysate and post replacement).  Likely will need to change pre to 4/2.5 later on today.  Strict ins and out and monitor lab closely   # Severe hyponatremia, subacute to chronic: We will aim for sodium level around 122 by tomorrow morning.  Reducing dialysis flow rates to minimize overcorrection.  Na level already 125 -> will need to start D5W (goal 123-125 by 3PM) at 41ml/hr.   Check Na q2 hr.   # Severe hyperkalemia: Treating medically in ER.  CRRT with 0 potassium bath -> K now 3.9 -> change to 4K bath.   # Anion gap metabolic acidosis with AKI: Sodium  bicarbonate with prefilter and CRRT as above.   # History of obstructive uropathy, bilateral hydronephrosis: Review CT scan of abdomen pelvis point done -> no hydro RK.    Subjective: Confused and tried pulling out the dialysis catheter   Chemistry and CBC: Creatinine, Ser  Date/Time Value Ref Range Status  09/04/2023 06:20 AM 7.69 (H) 0.44 - 1.00 mg/dL Final  55/73/2202 54:27 AM 10.11 (H) 0.44 - 1.00 mg/dL Final  12/08/7626 31:51 PM 10.34 (H) 0.44 - 1.00 mg/dL Final  76/16/0737 10:62 PM 10.15 (H) 0.44 - 1.00 mg/dL Final  69/48/5462 70:35 AM 1.22 (H) 0.44 - 1.00 mg/dL Final  00/93/8182 99:37 AM 1.20 (H) 0.44 - 1.00 mg/dL Final  16/96/7893 81:01 AM 0.93 0.44 - 1.00 mg/dL Final  75/03/2584 27:78 AM 0.95 0.44 - 1.00 mg/dL Final  24/23/5361 44:31 AM 0.92 0.44 - 1.00 mg/dL Final  54/00/8676 19:50 AM 1.23 (H) 0.44 - 1.00 mg/dL Final  93/26/7124 58:09 AM 1.24 (H) 0.44 - 1.00 mg/dL Final  98/33/8250 53:97 AM 1.21 (H) 0.44 - 1.00 mg/dL Final  67/34/1937 90:24 AM 0.97 0.44 - 1.00 mg/dL Final  09/73/5329 92:42 AM 0.82 0.44 - 1.00 mg/dL Final  68/34/1962 22:97 AM 0.77 0.44 - 1.00 mg/dL Final  98/92/1194 17:40 AM 0.88 0.44 - 1.00 mg/dL Final  81/44/8185 63:14 AM 0.94 0.44 - 1.00 mg/dL Final  97/07/6376 58:85 AM 1.26 (H) 0.44 - 1.00 mg/dL Final  02/77/4128 78:67 PM 1.44 (H) 0.44 - 1.00 mg/dL Final  67/20/9470 96:28 AM 1.81 (H) 0.44 - 1.00 mg/dL Final  36/62/9476  08:31 PM 2.07 (H) 0.44 - 1.00 mg/dL Final  78/29/5621 30:86 PM 2.44 (H) 0.44 - 1.00 mg/dL Final  57/84/6962 95:28 AM 1.38 (H) 0.44 - 1.00 mg/dL Final  41/32/4401 02:72 PM 1.42 (H) 0.44 - 1.00 mg/dL Final  53/66/4403 47:42 AM 0.95 0.44 - 1.00 mg/dL Final  59/56/3875 64:33 AM 1.15 (H) 0.44 - 1.00 mg/dL Final  29/51/8841 66:06 PM 1.32 (H) 0.44 - 1.00 mg/dL Final  30/16/0109 32:35 AM 1.08 (H) 0.44 - 1.00 mg/dL Final  57/32/2025 42:70 AM 1.23 (H) 0.44 - 1.00 mg/dL Final  62/37/6283 15:17 AM 1.48 (H) 0.44 - 1.00 mg/dL Final  61/60/7371  06:26 AM 1.58 (H) 0.44 - 1.00 mg/dL Final  94/85/4627 03:50 AM 1.05 (H) 0.57 - 1.00 mg/dL Final  09/38/1829 93:71 AM 1.11 (H) 0.57 - 1.00 mg/dL Final  69/67/8938 10:17 AM 1.10 (H) 0.57 - 1.00 mg/dL Final  51/07/5850 77:82 AM 1.55 (H) 0.57 - 1.00 mg/dL Final  42/35/3614 43:15 AM 1.12 (H) 0.57 - 1.00 mg/dL Final  40/01/6760 95:09 PM 1.01 (H) 0.44 - 1.00 mg/dL Final  32/67/1245 80:99 AM 1.26 (H) 0.57 - 1.00 mg/dL Final  83/38/2505 39:76 AM 1.06 (H) 0.57 - 1.00 mg/dL Final  73/41/9379 02:40 AM 1.16 (H) 0.57 - 1.00 mg/dL Final  97/35/3299 24:26 PM 0.98 0.44 - 1.00 mg/dL Final  83/41/9622 29:79 PM 0.99 0.57 - 1.00 mg/dL Final  89/21/1941 74:08 PM 0.90 0.57 - 1.00 mg/dL Final  14/48/1856 31:49 AM 1.02 (H) 0.57 - 1.00 mg/dL Final  70/26/3785 88:50 AM 1.00 0.50 - 1.10 mg/dL Final   Recent Labs  Lab 09/03/23 1703 09/03/23 2031 09/04/23 0001 09/04/23 0620 09/04/23 0800  NA 117* 119* 121* 122* 125*  K 6.6*  --  5.5* 3.9  --   CL 73*  --  73* 81*  --   CO2 10*  --  18* 18*  --   GLUCOSE 255*  --  83 137*  --   BUN 169*  --  171* 135*  --   CREATININE 10.15* 10.34* 10.11* 7.69*  --   CALCIUM 9.1  --  8.5* 8.0*  --   PHOS  --   --  >30.0* 9.3*  --    Recent Labs  Lab 09/03/23 2220 09/04/23 0001  WBC 11.9* 11.9*  NEUTROABS 10.4*  --   HGB 15.2* 12.5  HCT RESULTS UNAVAILABLE DUE TO INTERFERING SUBSTANCE 33.3*  MCV RESULTS UNAVAILABLE DUE TO INTERFERING SUBSTANCE 88.1  PLT 334 339   Liver Function Tests: Recent Labs  Lab 09/03/23 1703 09/04/23 0620  AST 30  --   ALT 54*  --   ALKPHOS 195*  --   BILITOT 0.8  --   PROT 8.0  --   ALBUMIN 4.3 3.4*   No results for input(s): "LIPASE", "AMYLASE" in the last 168 hours. No results for input(s): "AMMONIA" in the last 168 hours. Cardiac Enzymes: No results for input(s): "CKTOTAL", "CKMB", "CKMBINDEX", "TROPONINI" in the last 168 hours. Iron Studies: No results for input(s): "IRON", "TIBC", "TRANSFERRIN", "FERRITIN" in the last 72  hours. PT/INR: @LABRCNTIP (inr:5)  Xrays/Other Studies: ) Results for orders placed or performed during the hospital encounter of 09/03/23 (from the past 48 hours)  Comprehensive metabolic panel     Status: Abnormal   Collection Time: 09/03/23  5:03 PM  Result Value Ref Range   Sodium 117 (LL) 135 - 145 mmol/L    Comment: CRITICAL RESULT CALLED TO, READ BACK BY AND VERIFIED WITH CHRIS CHRISCO, RN @  1819 09/03/23 BY SEKDAHL   Potassium 6.6 (HH) 3.5 - 5.1 mmol/L    Comment: CRITICAL RESULT CALLED TO, READ BACK BY AND VERIFIED WITH CHRIS CHRISCO, RN @ 1819 09/03/23 BY SEKDAHL   Chloride 73 (L) 98 - 111 mmol/L   CO2 10 (L) 22 - 32 mmol/L   Glucose, Bld 255 (H) 70 - 99 mg/dL    Comment: Glucose reference range applies only to samples taken after fasting for at least 8 hours.   BUN 169 (H) 8 - 23 mg/dL   Creatinine, Ser 78.29 (H) 0.44 - 1.00 mg/dL   Calcium 9.1 8.9 - 56.2 mg/dL   Total Protein 8.0 6.5 - 8.1 g/dL   Albumin 4.3 3.5 - 5.0 g/dL   AST 30 15 - 41 U/L   ALT 54 (H) 0 - 44 U/L   Alkaline Phosphatase 195 (H) 38 - 126 U/L   Total Bilirubin 0.8 0.0 - 1.2 mg/dL   GFR, Estimated 4 (L) >60 mL/min    Comment: (NOTE) Calculated using the CKD-EPI Creatinine Equation (2021)    Anion gap 34 (H) 5 - 15    Comment: ELECTROLYTES REPEATED TO VERIFY Performed at [facility], [address], [place], [place] [address]   Urinalysis, Routine w reflex microscopic -Urine, Clean Catch     Status: Abnormal   Collection Time: 09/03/23  5:03 PM  Result Value Ref Range   Color, Urine YELLOW YELLOW   APPearance TURBID (A) CLEAR   Specific Gravity, Urine 1.012 1.005 - 1.030   pH 6.0 5.0 - 8.0   Glucose, UA NEGATIVE NEGATIVE mg/dL   Hgb urine dipstick SMALL (A) NEGATIVE   Bilirubin Urine NEGATIVE NEGATIVE   Ketones, ur NEGATIVE NEGATIVE mg/dL   Protein, ur 578 (A) NEGATIVE mg/dL   Nitrite NEGATIVE NEGATIVE   Leukocytes,Ua SMALL (A) NEGATIVE   RBC / HPF 6-10 0 - 5 RBC/hpf   WBC, UA >50 0 -  5 WBC/hpf   Bacteria, UA RARE (A) NONE SEEN   Squamous Epithelial / HPF 0-5 0 - 5 /HPF   WBC Clumps PRESENT     Comment: Performed at [facility], [address] [place], [place] [address]  Lactic acid, plasma     Status: Abnormal   Collection Time: 09/03/23  8:31 PM  Result Value Ref Range   Lactic Acid, Venous 3.6 (HH) 0.5 - 1.9 mmol/L    Comment: CRITICAL RESULT CALLED TO, READ BACK BY AND VERIFIED WITH N.HARDY RN 2146 09/03/2023 BY G.GANADEN 1ST ATTEMPT TO CALL 2124 09/03/2023 Performed at [facility] Lab, [address] [place], [place] [address]   Creatinine, serum     Status: Abnormal   Collection Time: 09/03/23  8:31 PM  Result Value Ref Range   Creatinine, Ser 10.34 (H) 0.44 - 1.00 mg/dL   GFR, Estimated 4 (L) >60 mL/min    Comment: (NOTE) Calculated using the CKD-EPI Creatinine Equation (2021) Performed at [facility], [address], [place], [place] [address]   Sodium     Status: Abnormal   Collection Time: 09/03/23  8:31 PM  Result Value Ref Range   Sodium 119 (LL) 135 - 145 mmol/L    Comment: CRITICAL RESULT CALLED TO, READ BACK BY AND VERIFIED WITH N.HARDY RN 2146 09/03/2023 BY G.GANADEN Performed at [facility] Lab, [address], [place], [place] [address]   CBG monitoring, ED     Status: Abnormal   Collection Time: 09/03/23  9:12  PM  Result Value Ref Range   Glucose-Capillary 127 (H) 70 - 99 mg/dL    Comment: Glucose reference range applies only to samples taken after fasting for at least 8 hours.  Hemoglobin A1c     Status: Abnormal   Collection Time: 09/03/23 10:20 PM  Result Value Ref Range   Hgb A1c MFr Bld 5.7 (H) 4.8 - 5.6 %    Comment: (NOTE) Pre diabetes:          5.7%-6.4%  Diabetes:              >6.4%  Glycemic control for   <7.0% adults with diabetes    Mean Plasma Glucose 116.89 mg/dL    Comment: Performed at [facility] Lab, [address] [place], [place] [address]  Protime-INR     Status: None   Collection  Time: 09/03/23 10:20 PM  Result Value Ref Range   Prothrombin Time 14.9 11.4 - 15.2 seconds   INR 1.1 0.8 - 1.2    Comment: (NOTE) INR goal varies based on device and disease states. Performed at [facility] Lab, [address] [place], [place] [address]   APTT     Status: None   Collection Time: 09/03/23 10:20 PM  Result Value Ref Range   aPTT 28 24 - 36 seconds    Comment: Performed at [facility], [address], [place], [place] [address]  CBC with Differential/Platelet     Status: Abnormal   Collection Time: 09/03/23 10:20 PM  Result Value Ref Range   WBC 11.9 (H) 4.0 - 10.5 K/uL   RBC RESULTS UNAVAILABLE DUE TO INTERFERING SUBSTANCE 3.87 - 5.11 MIL/uL   Hemoglobin 15.2 (H) 12.0 - 15.0 g/dL   HCT RESULTS UNAVAILABLE DUE TO INTERFERING SUBSTANCE 36.0 - 46.0 %   MCV RESULTS UNAVAILABLE DUE TO INTERFERING SUBSTANCE 80.0 - 100.0 fL   MCH RESULTS UNAVAILABLE DUE TO INTERFERING SUBSTANCE 26.0 - 34.0 pg   MCHC RESULTS UNAVAILABLE DUE TO INTERFERING SUBSTANCE 30.0 - 36.0 g/dL   RDW RESULTS UNAVAILABLE DUE TO INTERFERING SUBSTANCE 11.5 - 15.5 %   Platelets 334 150 - 400 K/uL   nRBC 0.0 0.0 - 0.2 %   Neutrophils Relative % 87 %   Neutro Abs 10.4 (H) 1.7 - 7.7 K/uL   Lymphocytes Relative 6 %   Lymphs Abs 0.7 0.7 - 4.0 K/uL   Monocytes Relative 6 %   Monocytes Absolute 0.7 0.1 - 1.0 K/uL   Eosinophils Relative 0 %   Eosinophils Absolute 0.0 0.0 - 0.5 K/uL   Basophils Relative 0 %   Basophils Absolute 0.0 0.0 - 0.1 K/uL   Immature Granulocytes 1 %   Abs Immature Granulocytes 0.15 (H) 0.00 - 0.07 K/uL    Comment: Performed at [facility] Lab, [address], [place], [place] [address]  Culture, blood (Routine X 2) w Reflex to ID Panel     Status: None (Preliminary result)   Collection Time: 09/03/23 10:45 PM   Specimen: BLOOD RIGHT ARM  Result Value Ref Range   Specimen Description BLOOD RIGHT ARM    Special Requests      BOTTLES DRAWN AEROBIC AND ANAEROBIC Blood  Culture results may not be optimal due to an inadequate volume of blood received in culture bottles   Culture      NO GROWTH < 12 HOURS Performed at [facility], [address], [place], [place] [address]    Report Status PENDING  MRSA Next Gen by PCR, Nasal     Status: None   Collection Time: 09/03/23 10:51 PM   Specimen: Nasal Mucosa; Nasal Swab  Result Value Ref Range   MRSA by PCR Next Gen NOT DETECTED NOT DETECTED    Comment: (NOTE) The GeneXpert MRSA Assay (FDA approved for NASAL specimens only), is one component of a comprehensive MRSA colonization surveillance program. It is not intended to diagnose MRSA infection nor to guide or monitor treatment for MRSA infections. Test performance is not FDA approved in patients less than 69 years old. Performed at [facility], [address] [place], [place] [address]   Lactic acid, plasma     Status: None   Collection Time: 09/04/23 12:01 AM  Result Value Ref Range   Lactic Acid, Venous 1.8 0.5 - 1.9 mmol/L    Comment: Performed at [facility] Lab, [address], [place], [place] [address]  CBC     Status: Abnormal   Collection Time: 09/04/23 12:01 AM  Result Value Ref Range   WBC 11.9 (H) 4.0 - 10.5 K/uL   RBC 3.78 (L) 3.87 - 5.11 MIL/uL   Hemoglobin 12.5 12.0 - 15.0 g/dL   HCT 09.8 (L) 11.9 - 14.7 %   MCV 88.1 80.0 - 100.0 fL   MCH 33.1 26.0 - 34.0 pg   MCHC 37.5 (H) 30.0 - 36.0 g/dL    Comment: CORRECTED FOR COLD AGGLUTININS   RDW 17.6 (H) 11.5 - 15.5 %   Platelets 339 150 - 400 K/uL   nRBC 0.0 0.0 - 0.2 %    Comment: Performed at [facility], [address], [place], [place] [address]  Basic metabolic panel     Status: Abnormal   Collection Time: 09/04/23 12:01 AM  Result Value Ref Range   Sodium 121 (L) 135 - 145 mmol/L   Potassium 5.5 (H) 3.5 - 5.1 mmol/L   Chloride 73 (L) 98 - 111 mmol/L   CO2 18 (L) 22 - 32 mmol/L   Glucose, Bld 83 70 - 99 mg/dL    Comment: Glucose reference range  applies only to samples taken after fasting for at least 8 hours.   BUN 171 (H) 8 - 23 mg/dL   Creatinine, Ser 21.30 (H) 0.44 - 1.00 mg/dL   Calcium 8.5 (L) 8.9 - 10.3 mg/dL   GFR, Estimated 4 (L) >60 mL/min    Comment: (NOTE) Calculated using the CKD-EPI Creatinine Equation (2021)    Anion gap 30 (H) 5 - 15    Comment: ELECTROLYTES REPEATED TO VERIFY Performed at [facility], [address], [place], [place] [address]   Magnesium     Status: Abnormal   Collection Time: 09/04/23 12:01 AM  Result Value Ref Range   Magnesium 1.4 (L) 1.7 - 2.4 mg/dL    Comment: Performed at [facility], [address], [place], [place] [address]  Phosphorus     Status: Abnormal   Collection Time: 09/04/23 12:01 AM  Result Value Ref Range   Phosphorus >30.0 (H) 2.5 - 4.6 mg/dL    Comment: RESULT CONFIRMED BY MANUAL DILUTION Performed at [facility], [address], [place], [place] [address]   Procalcitonin     Status: None   Collection Time: 09/04/23 12:01 AM  Result Value Ref Range   Procalcitonin 0.82 ng/mL    Comment:        Interpretation: PCT > 0.5 ng/mL and <= 2 ng/mL:  Systemic infection (sepsis) is possible, but other conditions are known to elevate PCT as well. (NOTE)       Sepsis PCT Algorithm           Lower Respiratory Tract                                      Infection PCT Algorithm    ----------------------------     ----------------------------         PCT < 0.25 ng/mL                PCT < 0.10 ng/mL          Strongly encourage             Strongly discourage   discontinuation of antibiotics    initiation of antibiotics    ----------------------------     -----------------------------       PCT 0.25 - 0.50 ng/mL            PCT 0.10 - 0.25 ng/mL               OR       >80% decrease in PCT            Discourage initiation of                                            antibiotics      Encourage discontinuation           of antibiotics     ----------------------------     -----------------------------         PCT >= 0.50 ng/mL              PCT 0.26 - 0.50 ng/mL                AND       <80% decrease in PCT             Encourage initiation of                                             antibiotics       Encourage continuation           of antibiotics    ----------------------------     -----------------------------        PCT >= 0.50 ng/mL                  PCT > 0.50 ng/mL               AND         increase in PCT                  Strongly encourage                                      initiation of antibiotics    Strongly encourage escalation           of antibiotics                                     -----------------------------  PCT <= 0.25 ng/mL                                                 OR                                        > 80% decrease in PCT                                      Discontinue / Do not initiate                                             antibiotics  Performed at [facility], [address] [place], [place] [address]   Glucose, capillary     Status: None   Collection Time: 09/04/23 12:04 AM  Result Value Ref Range   Glucose-Capillary 88 70 - 99 mg/dL    Comment: Glucose reference range applies only to samples taken after fasting for at least 8 hours.  Glucose, capillary     Status: Abnormal   Collection Time: 09/04/23  4:39 AM  Result Value Ref Range   Glucose-Capillary 152 (H) 70 - 99 mg/dL    Comment: Glucose reference range applies only to samples taken after fasting for at least 8 hours.  Culture, blood (Routine X 2) w Reflex to ID Panel     Status: None (Preliminary result)   Collection Time: 09/04/23  6:18 AM   Specimen: BLOOD RIGHT HAND  Result Value Ref Range   Specimen Description BLOOD RIGHT HAND    Special Requests      BOTTLES DRAWN AEROBIC AND ANAEROBIC Blood Culture adequate volume Performed at [facility]  Lab, [address], [place], [place] [address]    Culture PENDING    Report Status PENDING   Magnesium     Status: Abnormal   Collection Time: 09/04/23  6:18 AM  Result Value Ref Range   Magnesium 1.5 (L) 1.7 - 2.4 mg/dL    Comment: Performed at [facility], [address] [place], [place] [address]  Renal function panel (daily at 0500)     Status: Abnormal   Collection Time: 09/04/23  6:20 AM  Result Value Ref Range   Sodium 122 (L) 135 - 145 mmol/L   Potassium 3.9 3.5 - 5.1 mmol/L   Chloride 81 (L) 98 - 111 mmol/L   CO2 18 (L) 22 - 32 mmol/L   Glucose, Bld 137 (H) 70 - 99 mg/dL    Comment: Glucose reference range applies only to samples taken after fasting for at least 8 hours.   BUN 135 (H) 8 - 23 mg/dL   Creatinine, Ser 2.13 (H) 0.44 - 1.00 mg/dL   Calcium 8.0 (L) 8.9 - 10.3 mg/dL   Phosphorus 9.3 (H) 2.5 - 4.6 mg/dL   Albumin 3.4 (L) 3.5 - 5.0 g/dL   GFR, Estimated 5 (L) >60 mL/min    Comment: (NOTE) Calculated using the CKD-EPI Creatinine Equation (2021)    Anion gap 23 (H) 5 - 15  Comment: ELECTROLYTES REPEATED TO VERIFY Performed at [facility], [address], [place], [place] [address]   Culture, blood (Routine X 2) w Reflex to ID Panel     Status: None (Preliminary result)   Collection Time: 09/04/23  6:21 AM   Specimen: BLOOD RIGHT ARM  Result Value Ref Range   Specimen Description BLOOD RIGHT ARM    Special Requests      BOTTLES DRAWN AEROBIC AND ANAEROBIC Blood Culture adequate volume Performed at [facility], [address], [place], [place] [address]    Culture PENDING    Report Status PENDING   Sodium     Status: Abnormal   Collection Time: 09/04/23  8:00 AM  Result Value Ref Range   Sodium 125 (L) 135 - 145 mmol/L    Comment: DELTA CHECK NOTED Performed at [facility], [address] [place], [place] [address]   Glucose, capillary     Status: Abnormal   Collection Time: 09/04/23  8:16 AM  Result Value Ref Range    Glucose-Capillary 129 (H) 70 - 99 mg/dL    Comment: Glucose reference range applies only to samples taken after fasting for at least 8 hours.   DG Chest Port 1 View Result Date: 09/04/2023 CLINICAL DATA:  Central line placement EXAM: PORTABLE CHEST 1 VIEW COMPARISON:  06/09/2023 FINDINGS: Right internal jugular central line tip in the SVC. No pneumothorax. Mild hyperinflation of the lungs. Heart and mediastinal contours are within normal limits. No focal opacities or effusions. No acute bony abnormality. Aortic atherosclerosis. IMPRESSION: Right central line tip in the SVC.  No pneumothorax. No active disease. Electronically Signed   By: Charlett Nose M.D.   On: 09/04/2023 00:47   CT ABDOMEN PELVIS WO CONTRAST Result Date: 09/03/2023 CLINICAL DATA:  Kidney failure, acute kidney failure-evaluate for obstruction. CT with out contrast EXAM: CT ABDOMEN AND PELVIS WITHOUT CONTRAST TECHNIQUE: Multidetector CT imaging of the abdomen and pelvis was performed following the standard protocol without IV contrast. RADIATION DOSE REDUCTION: This exam was performed according to the departmental dose-optimization program which includes automated exposure control, adjustment of the mA and/or kV according to patient size and/or use of iterative reconstruction technique. COMPARISON:  06/09/2023 FINDINGS: Lower chest: No acute abnormality Hepatobiliary: No focal liver abnormality is seen. Status post cholecystectomy. No biliary dilatation. Pancreas: No focal abnormality or ductal dilatation. Spleen: No focal abnormality.  Normal size. Adrenals/Urinary Tract: Stable left adrenal nodule. Right adrenal gland unremarkable. Left kidney is atrophic, unchanged. Right nephrostomy catheter remains in place. No hydronephrosis. Previously seen right ureteral stent no longer visualized. Urinary bladder unremarkable. Stomach/Bowel: Right lower quadrant ostomy again noted. No evidence of bowel obstruction. Stomach unremarkable. No evidence of  bowel obstruction or inflammatory process. Vascular/Lymphatic: Aortoiliac atherosclerosis. No evidence of aneurysm or adenopathy. Reproductive: Status post hysterectomy. No adnexal mass. Air within the vagina is unchanged since prior study. Other: No free fluid or free air. Stable postoperative changes in the pelvis. Musculoskeletal: No acute bony abnormality. IMPRESSION: Right nephrostomy tube in place. No hydronephrosis. Left kidney remains atrophic and unchanged since prior study. Right lower quadrant ostomy.  No evidence of bowel obstruction. Diffuse aortoiliac atherosclerosis. Stable postoperative changes in the pelvis. Air within the vagina of unknown etiology. This is stable when compared to prior study. Electronically Signed   By: Charlett Nose M.D.   On: 09/03/2023 21:14    PMH:   Past Medical History:  Diagnosis Date   Atrophic kidney  Cancer [facility])    cervical 2003   Cervical cancer (HCC)    Colovaginal fistula    Pyelonephritis    Renal atrophy, left 11/19/2022   Thyroid disease    Vertigo     PSH:   Past Surgical History:  Procedure Laterality Date   ABDOMINAL ADHESION SURGERY  11/08/2020   Dr [name] - [facility]   ABDOMINAL HYSTERECTOMY  2003   [facility] - Dr [name] - for cervical cancer   BILE DUCT EXPLORATION  10/22/2003   CHOLECYSTECTOMY OPEN  10/22/2003   Dr Zachery Dakins - CBD repair w T tube   CYSTOSCOPY  2021   ILEOSTOMY  11/23/2018   ILEOSTOMY CLOSURE  04/2020   IR PATIENT EVAL TECH 0-60 MINS  06/20/2023   LOW ANTERIOR BOWEL RESECTION  11/23/2018   LAR/takedown colovaginal fistula/loop ileostomy  - [facility]    Allergies:  Allergies  Allergen Reactions   Barium-Containing Compounds     hives   Percocet [Oxycodone-Acetaminophen] Itching and Other (See Comments)    migraine     Medications:   Prior to Admission medications   Medication Sig Start Date End Date Taking? Authorizing Provider  acetaminophen (TYLENOL) 500 MG tablet Take 1,000 mg by mouth every 6 (six) hours as  needed.   Yes [provider]  amoxicillin (AMOXIL) 500 MG capsule Take 500 mg by mouth 3 (three) times daily. 08/27/23  Yes [provider]  cetirizine (ZYRTEC) 10 MG tablet Take 10 mg by mouth daily as needed for allergies or rhinitis.   Yes [provider]  cholestyramine light (PREVALITE) 4 g packet Take 4 g by mouth 2 (two) times daily. 08/08/23 11/06/23 Yes [provider]  dronabinol (MARINOL) 2.5 MG capsule Take 2.5 mg by mouth 2 (two) times daily as needed (Appetite).   Yes [provider]  fluticasone (FLONASE) 50 MCG/ACT nasal spray Place 1 spray into both nostrils daily as needed for allergies or rhinitis. 08/30/22  Yes [provider]  levothyroxine (SYNTHROID) 25 MCG tablet Take 1 tablet (25 mcg total) by mouth daily. 07/17/22  Yes Delynn Flavin M, DO  loperamide (IMODIUM) 2 MG capsule Take 2 mg by mouth 3 (three) times daily as needed for diarrhea or loose stools. 09/03/23  Yes [provider]  mirtazapine (REMERON) 7.5 MG tablet Take 7.5 mg by mouth at bedtime as needed (Anxiety/appetite).   Yes [provider]  Multiple Vitamins-Minerals (MULTIVITAMIN) tablet Take 1 tablet by mouth daily.   Yes [provider]  omeprazole (PRILOSEC) 20 MG capsule Take 20 mg by mouth daily as needed (Acid reflux). 05/21/23  Yes [provider]  ondansetron (ZOFRAN) 4 MG tablet Take 4 mg by mouth every 6 (six) hours as needed for nausea or vomiting.   Yes [provider]  promethazine (PHENERGAN) 12.5 MG tablet Take 12.5 mg by mouth every 6 (six) hours as needed for nausea or vomiting.   Yes [provider]  diphenoxylate-atropine (LOMOTIL) 2.5-0.025 MG tablet Take 2 tablets by mouth 3 (three) times daily as needed for diarrhea or loose stools. Patient not taking: Reported on 09/04/2023    [provider]  fluconazole (DIFLUCAN) 150 MG tablet Take 150 mg by mouth daily. For 7 days Patient  not taking: Reported on 09/04/2023 08/11/23   [provider]  simvastatin (ZOCOR) 20 MG tablet Take 20 mg by mouth at bedtime. Patient not taking: Reported on 09/04/2023    [provider]    Discontinued Meds:   Medications Discontinued  During This Encounter  Medication Reason   0.9 %  sodium chloride infusion    cefTRIAXone (ROCEPHIN) 1 g in sodium chloride 0.9 % 100 mL IVPB    norepinephrine (LEVOPHED) 4mg  in (0.016 mg/mL) premix infusion Entry Error   sodium chloride 1 g tablet Completed Course   sertraline (ZOLOFT) 25 MG tablet Discontinued by provider   norepinephrine (LEVOPHED) 4mg  in (0.016 mg/mL) premix infusion    Amino Acid Infusion in D15W (CLINIMIX/DEXTROSE, 5/15, IV) Discontinued by provider   Fat Emulsion Plant Based (FAT EMULSION 20 %, TPN/TNA, FOR ANES DOCUMENTATION) 20 % EMUL Discontinued by provider   divalproex (DEPAKOTE) 125 MG DR tablet Discontinued by provider   Lactobacillus Rhamnosus, GG, (CULTURELLE HEALTH & WELLNESS) CAPS Discontinued by provider   mupirocin ointment (BACTROBAN) 2 % Patient Preference   promethazine (PHENERGAN) 12.5 MG tablet Patient Preference   polyethylene glycol powder (GLYCOLAX/MIRALAX) 17 GM/SCOOP powder Patient Preference   rizatriptan (MAXALT-MLT) 10 MG disintegrating tablet Patient Preference   hydrOXYzine (ATARAX) 25 MG tablet Patient Preference   Oyster Shell Calcium 500 MG TABS Patient Preference   Multiple Vitamins-Minerals (ONCOVITE) TABS Patient Preference   sodium zirconium cyclosilicate (LOKELMA) packet 5 g     Social History:  reports that she has quit smoking. Her smoking use included cigarettes. She has never used smokeless tobacco. She reports that she does not currently use alcohol. She reports that she does not use drugs.  Family History:   Family History  Problem Relation Age of Onset   Heart disease Father    Diabetes Father    Parkinson's disease Neg Hx     Blood pressure 92/65,  pulse 75, temperature 98.4 F (36.9 C), temperature source Oral, resp. rate 15, height 5\' 2"  (1.575 m), weight 37.8 kg, SpO2 98%. Physical Exam: General exam: Critically ill looking female, cachectic, not in distress Respiratory system: Clear to auscultation. Respiratory effort normal. No wheezing or crackle Cardiovascular system: S1 & S2 heard, RRR.  No pedal edema. Gastrointestinal system: Abdomen is nondistended, ileostomy bag with loose stool connected to drainage. Central nervous system: Alert awake but somnolent/confused. Extremities: No edema. Skin: No rashes Access: RIJ temp (3/19)     Ethelene Hal, MD 09/04/2023, 9:51 AM

## 2023-09-04 NOTE — Procedures (Signed)
 Central Venous Catheter Insertion Procedure Note  [name]  [id number]  1955-11-27  Date:09/04/23  Time:12:13 AM     Provider Performing:Shaniya Tashiro C Katrinka Blazing  Supervised by Theodis Sato CCM PA    Procedure: Insertion of Non-tunneled Central Venous Catheter(36556)with US guidance (78295)    Indication(s) Hemodialysis  Consent Risks of the procedure as well as the alternatives and risks of each were explained to the patient and/or caregiver.  Consent for the procedure was obtained and is signed in the bedside chart  Anesthesia Topical only with 1% lidocaine   Timeout Verified patient identification, verified procedure, site/side was marked, verified correct patient position, special equipment/implants available, medications/allergies/relevant history reviewed, required imaging and test results available.  Sterile Technique Maximal sterile technique including full sterile barrier drape, hand hygiene, sterile gown, sterile gloves, mask, hair covering, sterile ultrasound probe cover (if used).  Procedure Description Area of catheter insertion was cleaned with chlorhexidine and draped in sterile fashion.   With real-time ultrasound guidance a HD catheter was placed into the right internal jugular vein.  Nonpulsatile blood flow and easy flushing noted in all ports.  The catheter was sutured in place and sterile dressing applied.  Complications/Tolerance None; patient tolerated the procedure well. Chest X-ray is ordered to verify placement for internal jugular or subclavian cannulation.  Chest x-ray is not ordered for femoral cannulation.  EBL Minimal  Specimen(s) None   Hazel Sams AGACNP-BC    Pulmonary & Critical Care 09/04/2023, 12:14 AM  Please see [email or web address] for pager details.  From 7A-7P if no response, please call [phone]. After hours, please call ELink [phone].

## 2023-09-04 NOTE — Progress Notes (Addendum)
 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  Vital signs are within normal limits.  100% saturation on room air.  Status post DDAVP and broad-spectrum antibiotics.  Results show progressive improvement in serum sodium, improvement in hyperkalemia and anion gap metabolic acidosis.  Severe hypophosphatemia and hypomagnesemia present.  Mild leukocytosis present.  CT abdomen with stable chronic findings.  Urinalysis grossly positive chest radiograph with central line terminating at the SVC.    eICU Interventions  Anticipating initiation of continuous renal replacement therapy.  Trend sodium, goal sodium 122 this morning-initiate D5W if it exceeds 122.  Broad-spectrum antibiotics in place  DVT prophylaxis with heparin GI prophylaxis not indicated    0504 -increasing agitation with mittens on, screaming and yelling, grabbing at lines/tubes.  Will initiate low-dose Precedex.  Can initiate restraints if an imminent danger   Intervention Category Intermediate Interventions: Electrolyte abnormality - evaluation and management Evaluation Type: New Patient Evaluation  Wyndham Santilli 09/04/2023, 1:19 AM

## 2023-09-04 NOTE — Progress Notes (Addendum)
 Initial Nutrition Assessment  DOCUMENTATION CODES:  Severe malnutrition in context of chronic illness, Underweight (recurrent GI surgeries and hospitalizations (5 in the last 6 months), recurrent TPN use)  INTERVENTION:  Ensure Enlive po BID, each supplement provides 350 kcal and 20 grams of protein.  Magic cup TID with meals, each supplement provides 290 kcal and 9 grams of protein  MVI, thiamine and folic acid daily supplementation  Check fat soluble vitamin labs: A, D, E, K along with vitamin C and zinc due to suspected deficiencies/ malabsorption   NUTRITION DIAGNOSIS:  Severe Malnutrition related to chronic illness (recurrent GI surgeries and hospitalizations (5 in the last 6 months), recurrent TPN use) as evidenced by severe fat depletion, severe muscle depletion, percent weight loss.  GOAL:  Patient will meet greater than or equal to 90% of their needs  MONITOR:  Supplement acceptance, PO intake, Labs, Weight trends, I & O's  REASON FOR ASSESSMENT:   Consult Assessment of nutrition requirement/status, Poor PO  ASSESSMENT:   Pt with hx of cervical cancer, SBO (multiple), rectovaginal fistula, hydronephrosis, recurrent AKI, severe dehydration, chronic malnutrition and TPN use. Hx of urethral stent placement (03/2023), R nephrostomy placement (04/2023), anterior resection with colovaginal fistula repair (2021) and ileum cecum resection w/ ileostomy creation (2024). Pt admitted with acute kidney failure and acidosis w/ concerns for uretal obstruction sent by [facility].  Initiated CRRT to manage AKI, no volume being pulled and pt oliguric. Correcting hyperkalemia, hyperphosphatemia, hyponatremia, and hypomagnesia. Monitoring output from ileostomy and nephrostomy. Acute encephalopathy suspected due to uretal obstruction, severe dehydration, or urosepsis.   Spoke with pt and pt's husband who was at bedside. Pt's husband answered most questions, but pt was awake and orientation  status difficult to assess. Pt diet advanced from NPO during assessment, could not assess current appetite, but pt was eating ice cream during assessment and tolerated it well. Pt reports no chewing or swallowing issues, SLP consult completed prior to assessment and DYS 3 diet deemed appropriate for pt.   PTA, pt had been hospitalized multiple times in the last 6 months (5 times). Pt's husband reports issues with small bowel began during COVID around 2020. Pt has had multiple SBO which has lead to needing bowel resection surgeries. Pt's husband reported all surgeries completed at [facility] by Dr. [name]. Pt has hx of TPN use during and after admissions due to decreased bowel function, but husband reports pt had not been using TPN leading up to most recent admission. Pt's husband reports not wanting to continue TPN for pt due to past bad experience in managing the TPN at home. Pt's husband reports after last admission, the doctors recommended IV fluids with home health, but husband denied since pt was tolerating foods well. Pt typically only eats 2-3x per day with breakfast and dinner being the main meals and a snack for lunch. Breakfast consisted of eggs, bacon, cereal, or doughnuts. Dinner consisted of chicken, spaghetti, frozen pizza. Snacks varied depending on what pt was in the mood for but may be cookies or crackers. Pt's husband also reports pt enjoys ice cream and ate ice cream almost every night. Husband reports pt did not like Boost supplements much, but was told to try to mix the Boost with ice cream to see if pt would eat it. Encouraged husband to try supplement during admission and mix it with ice cream as well. Pt's husband reports pt would drink water, gatorade, coffee, or sprite/coke at home.   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 Pt also had hydronephrosis requiring the placement of nephrostomy on R side and a urethral stent. Pt's  husband reports 250cc output typically in ileostomy but he reports not having to empty bag for a few days. Ileostomy output today was black and very watery, pt's husband reports it is typically pudding thickness brown in color, suspect malabsorption. Unsure of current bowel function of pt or how much small bowel pt has had resected. Ordered labs to check values of vitamin A, D, E, K since they are absorbed in ileum and malabsorption may be an issue. Dry skin, petechiae, and bruising noticed on nutrition focused physical exam and pt reports her skin has not always been dry, but that is a recent change. Fatty acid deficiency suspected.  Pt's husband reports pt's typical weight to be around 110# which is consistent with admission weight at [facility] in 05/2023. Pt currently weighs 83#, indicating a 24% loss in 3 months which is clinically significant.   PTA, pt's mobility was good. Pt's husband said pt was walking without assistance and could walk up and down steps and also get into his truck. He reports only having to help pt a few times.  Medications reviewed and include:  Levothyroxine Levo 40mcg/hr Zosyn and vancomycin   Labs reviewed:  CBG over last 24 hours: 88-152mg /dL Sodium 956<--213 Potassium 3.9<-- 6.6 Phosphorus 9.3<-- 30.0 Magnesium 1.5<--1.4 BUN 135<--171 Creatinine 7.69<--10.34  NUTRITION - FOCUSED PHYSICAL EXAM: Flowsheet Row Most Recent Value  Orbital Region Severe depletion  Upper Arm Region Moderate depletion  Thoracic and Lumbar Region Severe depletion  Buccal Region Severe depletion  Temple Region Severe depletion  Clavicle Bone Region Severe depletion  Clavicle and Acromion Bone Region Severe depletion  Scapular Bone Region Severe depletion  Dorsal Hand Severe depletion  Patellar Region Severe depletion  Anterior Thigh Region Severe depletion  Posterior Calf Region Moderate depletion  Edema (RD Assessment) None  Hair Reviewed  Eyes Reviewed  Mouth Reviewed   Skin Reviewed  [petechiae, bruising easily, recent dryness]  Nails Unable to assess  [painted nails]   Diet Order:   Diet Order             DIET DYS 3 Fluid consistency: Thin  Diet effective now                  EDUCATION NEEDS:  Education needs have been addressed  Skin:  Skin Assessment: Reviewed RN Assessment  Last BM:  PTA  Height:  Ht Readings from Last 1 Encounters:  09/03/23 5\' 2"  (1.575 m)   Weight:  Wt Readings from Last 1 Encounters:  09/04/23 37.8 kg   Ideal Body Weight:  50 kg  BMI:  Body mass index is 15.24 kg/m.  Estimated Nutritional Needs:   Kcal:  1400-1600  Protein:  70-80g  Fluid:  1 L +UOP  Louis Meckel Dietetic Intern

## 2023-09-04 NOTE — Progress Notes (Signed)
 NAME:  [name], MRN:  [id number], DOB:  [birth year], LOS: 1 ADMISSION DATE:  09/03/2023, CONSULTATION DATE: 09/03/23  REFERRING MD:  ED MD Rubin Payor  CHIEF COMPLAINT:  Abnormal Lab work   History of Present Illness:  Pt is a 68 yr female with a significant past medical history for cervical cancer 2003 s/p hysterectomy, hx of TPN use/malnutrition, right hydronephrosis s/p uretal stent 03/2023 and R nephrostomy tube placed in 04/2023 due to hospitalization at [facility] for obstructive uropathy AKI and BL hydronephrosis complicated by Enterobacter and Candida UTI,  rectovaginal s/p anterior resection with colovaginal fistula repair, loop ileostomy with closure in 2021, SBO s/p with lysis of adhesions, recurrent SBO, ileum/cecum resection with ileostomy creation 2024, and recent [facility] hospitalization (2/14-2/23/25) which revealed patient had severe dehydration in the setting of high output and poor oral intake who presents to [facility] ED from [facility] due to abnormal lab work indicating acute renal failure (Cr >10, BUN >169, Anion Gap acidosis, severe hyperkalemia-k>6, and hyponatremia Na 117. Due to severity of acute kidney failure and acidosis, PCCM was consulted for ICU admission and further management along with nephrology consult.  Upon initial assessment, patient sitting up in ED stretcher with husband at beside. Patient with intermittent confusion, and disoriented to situation/time. Therefore husband able to provide and assist with HPI. According to husband, had initial appointment with MD Sandford with [facility] and when lab work came back, husband was advised to take patient to [facility] ED immediately. Per husband, over the last few weeks-patient has had issues with sleeping, more fatigued, and having poor oral intake. Husband endorses that ileostomy and right side nephrostomy tube have had moderate output. Patient states that she feels nauseous and sick on her stomach but no vomiting  currently. Husband denies patient having any chills/fevers, and or sick contacts. Patient tachycardic but normotensive, on room air, O2 sats 99 to 100%,  and malnourished.   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 rate 16, height 5\' 2"  (1.575 m), weight 37.8 kg, SpO2 99%.        Intake/Output Summary (Last 24 hours) at 09/04/2023 4098 Last data filed at 09/04/2023 0800 Gross per 24 hour  Intake 782.71 ml  Output 182 ml  Net 600.71 ml   Filed Weights   09/03/23 1628 09/03/23 2300 09/04/23 0419  Weight: 49 kg 37.8 kg 37.8 kg   General:  ill-appearing F resting in bed in NAD HEENT: MM pink/dry  Neuro: alert, following commands, oriented to person only  CV: s1s2 rrr, no m/r/g PULM:  clear bilaterally, no wheezing  GI: soft, bsx4 active, non-tender, ileostomy and nephrostomy Extremities: warm/dry, no edema  Skin: no rashes or lesions   Labs Na 125 Creat 6.97 Gap 23 Wbc 11.9  Resolved Hospital Problem list   N/a  Assessment & Plan:    Acute Renal Failure with Electrolyte Abnormalities Hyperkalemia Hyponatremia   Anion Gap Metabolic Acidosis  Uremia  Ct abd/pelvis without obstruction and hydronephrosis  2024-obstructive uropathy AKI and BL hydronephrosis complicated by  Enterobacter and Candida UTI -Patient had televisit on 08/27/23 for vaginal  irritation and cloudy urine-started on amoxicillin and diflucan  Cr > 10, BUN 169, K 6.6, Na 117  WBC, Lactic acid/procal pending  P:  -electrolytes improving with CRRT -Na overcorrected 125, goal 122, D5W initiated -Received one time dose of lokelma 5g  -Trialysis line placed  -Continue to trend renal function panel, and electrolytes  -follow blood and urine cultures,  -continue ceftriaxone 1g/24hrs and diflucan 400mg  IV/24hrs  -lactic cleared from 3.6 to 1.8 -Trend WBC, fever curve  -Continue to monitor urine output per urostomy tube -Continue strict I/O, continue adequate renal perfusion, avoid nephrotoxic agents   Acute Encephalopathy secondary to uremia/hypernatronemia  Likely secondary to sepsis and uremia  P: Continue plan as above Initiate Delirium precautions and fall precautions   SBO s/p with lysis of adhesions Recurrent SBO hx  recurrent SBO, ileum/cecum resection with ileostomy creation 2024 Recent admission for severe dehydration and high output  P: Continue to monitor output-has had history of high output   Malnutrition  Hx of TPN use  BMI 19.76 P: Will need RD consult for recs Continue NPO with sips with meds, if patient tolerates sips  Can eventually a Renal diet  Swallow screen pending     Best Practice (right click and "Reselect all SmartList Selections" daily)   Diet/type: NPO w/ oral meds DVT prophylaxis prophylactic heparin  Pressure ulcer(s): N/A GI prophylaxis: PPI Lines: N/A Foley:  Yes, and it is still needed Code Status:  full code Last date of multidisciplinary goals of care discussion [ updated patient and husband at beside with CCM MD Sherryll Burger and MD [facility] with Nephrology   Labs   CBC: Recent Labs  Lab 09/03/23 2220 09/04/23 0001  WBC 11.9* 11.9*  NEUTROABS 10.4*  --   HGB 15.2* 12.5  HCT RESULTS UNAVAILABLE DUE TO INTERFERING SUBSTANCE 33.3*  MCV RESULTS UNAVAILABLE DUE TO INTERFERING SUBSTANCE 88.1  PLT 334 339    Basic  Metabolic Panel: Recent Labs  Lab 09/03/23 1703 09/03/23 2031 09/04/23 0001 09/04/23 0618 09/04/23 0620  NA 117* 119* 121*  --  122*  K 6.6*  --  5.5*  --  3.9  CL 73*  --  73*  --  81*  CO2 10*  --  18*  --  18*  GLUCOSE 255*  --  83  --  137*  BUN 169*  --  171*  --  135*  CREATININE 10.15* 10.34* 10.11*  --  7.69*  CALCIUM 9.1  --  8.5*  --  8.0*  MG  --   --  1.4* 1.5*  --   PHOS  --   --  >30.0*  --  9.3*   GFR: Estimated Creatinine Clearance: 4.2 mL/min (A) (by C-G formula based on SCr of 7.69 mg/dL (H)). Recent Labs  Lab 09/03/23 2031 09/03/23 2220 09/04/23 0001  PROCALCITON  --   --  0.82  WBC  --  11.9* 11.9*  LATICACIDVEN 3.6*  --  1.8    Liver Function Tests: Recent Labs  Lab 09/03/23 1703 09/04/23 0620  AST 30  --   ALT 54*  --   ALKPHOS 195*  --   BILITOT 0.8  --   PROT 8.0  --  ALBUMIN 4.3 3.4*   No results for input(s): "LIPASE", "AMYLASE" in the last 168 hours. No results for input(s): "AMMONIA" in the last 168 hours.  ABG    Component Value Date/Time   HCO3 12.5 (L) 04/28/2023 2354   ACIDBASEDEF 12.4 (H) 04/28/2023 2354   O2SAT 29.2 04/28/2023 2354     Coagulation Profile: Recent Labs  Lab 09/03/23 2220  INR 1.1    Cardiac Enzymes: No results for input(s): "CKTOTAL", "CKMB", "CKMBINDEX", "TROPONINI" in the last 168 hours.  HbA1C: HB A1C (BAYER DCA - WAIVED)  Date/Time Value Ref Range Status  12/11/2020 10:13 AM 4.7 <7.0 % Final    Comment:                                          Diabetic Adult            <7.0                                       Healthy Adult        4.3 - 5.7                                                           (DCCT/NGSP) American Diabetes Association's Summary of Glycemic Recommendations for Adults with Diabetes: Hemoglobin A1c <7.0%. More stringent glycemic goals (A1c <6.0%) may further reduce complications at the cost of increased risk of hypoglycemia.    Hgb A1c MFr Bld  Date/Time Value Ref  Range Status  09/03/2023 10:20 PM 5.7 (H) 4.8 - 5.6 % Final    Comment:    (NOTE) Pre diabetes:          5.7%-6.4%  Diabetes:              >6.4%  Glycemic control for   <7.0% adults with diabetes     CBG: Recent Labs  Lab 09/03/23 2112 09/04/23 0004 09/04/23 0439 09/04/23 0816  GLUCAP 127* 88 152* 129*    Review of Systems:   See HPI   Past Medical History:  She,  has a past medical history of Atrophic kidney, Cancer (HCC), Cervical cancer (HCC), Colovaginal fistula, Pyelonephritis, Renal atrophy, left (11/19/2022), Thyroid disease, and Vertigo.   Surgical History:   Past Surgical History:  Procedure Laterality Date   ABDOMINAL ADHESION SURGERY  11/08/2020   Dr [name] - [facility]   ABDOMINAL HYSTERECTOMY  2003   [facility] - Dr [name] - for cervical cancer   BILE DUCT EXPLORATION  10/22/2003   CHOLECYSTECTOMY OPEN  10/22/2003   Dr Zachery Dakins - CBD repair w T tube   CYSTOSCOPY  2021   ILEOSTOMY  11/23/2018   ILEOSTOMY CLOSURE  04/2020   IR PATIENT EVAL TECH 0-60 MINS  06/20/2023   LOW ANTERIOR BOWEL RESECTION  11/23/2018   LAR/takedown colovaginal fistula/loop ileostomy  - [facility]     Social History:   reports that she has quit smoking. Her smoking use included cigarettes. She has never used smokeless tobacco. She reports that she does not currently use alcohol. She reports that she does not use drugs.   Family History:  Her family history includes Diabetes in her father; Heart disease in her father. There is no history of Parkinson's disease.   Allergies Allergies  Allergen Reactions   Barium-Containing Compounds     hives   Percocet [Oxycodone-Acetaminophen] Itching and Other (See Comments)    migraine      Home Medications  Prior to Admission medications   Medication Sig Start Date End Date Taking? Authorizing Provider  acetaminophen (TYLENOL) 500 MG tablet Take 1,000 mg by mouth every 6 (six) hours as needed.    [provider]  Amino Acid Infusion in  D15W (CLINIMIX/DEXTROSE, 5/15, IV) Inject 42 mLs into the vein See admin instructions. 42 ml per hour, intravenously every 24 hours Patient not taking: Reported on 06/25/2023    [provider]  cetirizine (ZYRTEC) 10 MG tablet Take 10 mg by mouth daily. PRN    [provider]  divalproex (DEPAKOTE) 125 MG DR tablet Take 125 mg by mouth at bedtime.    [provider]  dronabinol (MARINOL) 2.5 MG capsule Take 2.5 mg by mouth 2 (two) times daily. Patient not taking: Reported on 06/25/2023    [provider]  Fat Emulsion Plant Based (FAT EMULSION 20 %, TPN/TNA, FOR ANES DOCUMENTATION) 20 % EMUL Inject 21 mLs into the vein continuous. 21 ml per hour every 24 hour (250 ml over 12 hours), once lipid bag is completed, disconnect lipid line, flush picc line with 5-10 ml saline flush. Patient not taking: Reported on 06/25/2023    [provider]  fluticasone (FLONASE) 50 MCG/ACT nasal spray Place 1 spray into both nostrils daily. 08/30/22   [provider]  hydrOXYzine (ATARAX) 25 MG tablet Take 25 mg by mouth every 8 (eight) hours as needed for anxiety.    [provider]  Lactobacillus Rhamnosus, GG, (CULTURELLE HEALTH & WELLNESS) CAPS Take 1 capsule by mouth daily. 05/21/23   [provider]  levothyroxine (SYNTHROID) 25 MCG tablet Take 1 tablet (25 mcg total) by mouth daily. 07/17/22   [name], DO  mirtazapine (REMERON) 7.5 MG tablet Take 7.5 mg by mouth at bedtime.    [provider]  Multiple Vitamins-Minerals (MULTIVITAMIN) tablet Take 1 tablet by mouth daily.    [provider]  Multiple Vitamins-Minerals (ONCOVITE) TABS Take 1 tablet by mouth daily. 05/21/23   [provider]  mupirocin ointment (BACTROBAN) 2 % 1 Application 2 (two) times daily. To red area around insertion site for 7 days    [provider]  omeprazole (PRILOSEC) 20 MG capsule Take 20 mg by mouth in the morning. 05/21/23    [provider]  ondansetron (ZOFRAN) 4 MG tablet Take 4 mg by mouth every 6 (six) hours as needed for nausea or vomiting. Patient not taking: Reported on 06/25/2023    [provider]  Ethelda Chick Calcium 500 MG TABS Take 1,250 mg by mouth daily. 05/21/23   [provider]  polyethylene glycol powder (GLYCOLAX/MIRALAX) 17 GM/SCOOP powder Take 1 Container by mouth once. Give 1 scoop by mouth every 24 hours as needed for constipation    [provider]  promethazine (PHENERGAN) 12.5 MG tablet Take 12.5 mg by mouth every 6 (six) hours as needed for nausea or vomiting. Patient not taking: Reported on 06/25/2023    [provider]  sertraline (ZOLOFT) 25 MG tablet Take 25 mg by mouth daily. Patient not taking: Reported on 06/25/2023    [provider]  sodium chloride 1 g tablet Take  1 g by mouth daily. For 3 days Patient not taking: Reported on 06/25/2023    [provider]     Critical care time: 40 mins     CRITICAL CARE Performed by: Darcella Gasman Kirbie Stodghill   Total critical care time: 40 minutes  Critical care time was exclusive of separately billable procedures and treating other patients.  Critical care was necessary to treat or prevent imminent or life-threatening deterioration.  Critical care was time spent personally by me on the following activities: development of treatment plan with patient and/or surrogate as well as nursing, discussions with consultants, evaluation of patient's response to treatment, examination of patient, obtaining history from patient or surrogate, ordering and performing treatments and interventions, ordering and review of laboratory studies, ordering and review of radiographic studies, pulse oximetry and re-evaluation of patient's condition.    Darcella Gasman Ignace Mandigo, PA-C [facility] See Amion for pager If no response to pager , please call [phone] until 7pm After 7:00 pm call Elink   [phone]

## 2023-09-05 DIAGNOSIS — E875 Hyperkalemia: Secondary | ICD-10-CM | POA: Diagnosis not present

## 2023-09-05 DIAGNOSIS — E43 Unspecified severe protein-calorie malnutrition: Secondary | ICD-10-CM | POA: Insufficient documentation

## 2023-09-05 DIAGNOSIS — E871 Hypo-osmolality and hyponatremia: Secondary | ICD-10-CM | POA: Diagnosis not present

## 2023-09-05 DIAGNOSIS — N179 Acute kidney failure, unspecified: Secondary | ICD-10-CM | POA: Diagnosis not present

## 2023-09-05 LAB — CBC
HCT: 19.4 % — ABNORMAL LOW (ref 36.0–46.0)
HCT: 19 % — ABNORMAL LOW (ref 36.0–46.0)
HCT: 19.9 % — ABNORMAL LOW (ref 36.0–46.0)
Hemoglobin: 7.2 g/dL — ABNORMAL LOW (ref 12.0–15.0)
Hemoglobin: 7 g/dL — ABNORMAL LOW (ref 12.0–15.0)
Hemoglobin: 7.2 g/dL — ABNORMAL LOW (ref 12.0–15.0)
MCH: 34 pg (ref 26.0–34.0)
MCH: 34.3 pg — ABNORMAL HIGH (ref 26.0–34.0)
MCH: 34 pg (ref 26.0–34.0)
MCHC: 37.1 g/dL — ABNORMAL HIGH (ref 30.0–36.0)
MCHC: 36.8 g/dL — ABNORMAL HIGH (ref 30.0–36.0)
MCHC: 36.2 g/dL — ABNORMAL HIGH (ref 30.0–36.0)
MCV: 91.5 fL (ref 80.0–100.0)
MCV: 93.1 fL (ref 80.0–100.0)
MCV: 93.9 fL (ref 80.0–100.0)
Platelets: 94 10*3/uL — ABNORMAL LOW (ref 150–400)
Platelets: 92 10*3/uL — ABNORMAL LOW (ref 150–400)
Platelets: 106 10*3/uL — ABNORMAL LOW (ref 150–400)
RBC: 2.12 MIL/uL — ABNORMAL LOW (ref 3.87–5.11)
RBC: 2.04 MIL/uL — ABNORMAL LOW (ref 3.87–5.11)
RBC: 2.12 MIL/uL — ABNORMAL LOW (ref 3.87–5.11)
RDW: 17.7 % — ABNORMAL HIGH (ref 11.5–15.5)
RDW: 17.8 % — ABNORMAL HIGH (ref 11.5–15.5)
RDW: 17.9 % — ABNORMAL HIGH (ref 11.5–15.5)
WBC: 6.6 10*3/uL (ref 4.0–10.5)
WBC: 8.7 10*3/uL (ref 4.0–10.5)
WBC: 9.8 10*3/uL (ref 4.0–10.5)
nRBC: 0 % (ref 0.0–0.2)
nRBC: 0 % (ref 0.0–0.2)
nRBC: 0 % (ref 0.0–0.2)

## 2023-09-05 LAB — RENAL FUNCTION PANEL
Albumin: 3.3 g/dL — ABNORMAL LOW (ref 3.5–5.0)
Anion gap: 15 (ref 5–15)
BUN: 46 mg/dL — ABNORMAL HIGH (ref 8–23)
CO2: 25 mmol/L (ref 22–32)
Calcium: 8.3 mg/dL — ABNORMAL LOW (ref 8.9–10.3)
Chloride: 89 mmol/L — ABNORMAL LOW (ref 98–111)
Creatinine, Ser: 3.32 mg/dL — ABNORMAL HIGH (ref 0.44–1.00)
GFR, Estimated: 15 mL/min — ABNORMAL LOW (ref >60)
Glucose, Bld: 136 mg/dL — ABNORMAL HIGH (ref 70–99)
Phosphorus: 4.2 mg/dL (ref 2.5–4.6)
Potassium: 3.5 mmol/L (ref 3.5–5.1)
Sodium: 129 mmol/L — ABNORMAL LOW (ref 135–145)

## 2023-09-05 LAB — URINALYSIS, MICROSCOPIC (REFLEX)

## 2023-09-05 LAB — BASIC METABOLIC PANEL
Anion gap: 11 (ref 5–15)
Anion gap: 13 (ref 5–15)
Anion gap: 12 (ref 5–15)
Anion gap: 12 (ref 5–15)
BUN: 36 mg/dL — ABNORMAL HIGH (ref 8–23)
BUN: 27 mg/dL — ABNORMAL HIGH (ref 8–23)
BUN: 25 mg/dL — ABNORMAL HIGH (ref 8–23)
BUN: 26 mg/dL — ABNORMAL HIGH (ref 8–23)
CO2: 26 mmol/L (ref 22–32)
CO2: 25 mmol/L (ref 22–32)
CO2: 27 mmol/L (ref 22–32)
CO2: 27 mmol/L (ref 22–32)
Calcium: 7.9 mg/dL — ABNORMAL LOW (ref 8.9–10.3)
Calcium: 8.3 mg/dL — ABNORMAL LOW (ref 8.9–10.3)
Calcium: 8.3 mg/dL — ABNORMAL LOW (ref 8.9–10.3)
Calcium: 8.6 mg/dL — ABNORMAL LOW (ref 8.9–10.3)
Chloride: 87 mmol/L — ABNORMAL LOW (ref 98–111)
Chloride: 92 mmol/L — ABNORMAL LOW (ref 98–111)
Chloride: 91 mmol/L — ABNORMAL LOW (ref 98–111)
Chloride: 91 mmol/L — ABNORMAL LOW (ref 98–111)
Creatinine, Ser: 2.95 mg/dL — ABNORMAL HIGH (ref 0.44–1.00)
Creatinine, Ser: 2.55 mg/dL — ABNORMAL HIGH (ref 0.44–1.00)
Creatinine, Ser: 2.38 mg/dL — ABNORMAL HIGH (ref 0.44–1.00)
Creatinine, Ser: 2.63 mg/dL — ABNORMAL HIGH (ref 0.44–1.00)
GFR, Estimated: 17 mL/min — ABNORMAL LOW (ref >60)
GFR, Estimated: 20 mL/min — ABNORMAL LOW (ref >60)
GFR, Estimated: 22 mL/min — ABNORMAL LOW (ref >60)
GFR, Estimated: 19 mL/min — ABNORMAL LOW (ref >60)
Glucose, Bld: 249 mg/dL — ABNORMAL HIGH (ref 70–99)
Glucose, Bld: 226 mg/dL — ABNORMAL HIGH (ref 70–99)
Glucose, Bld: 83 mg/dL (ref 70–99)
Glucose, Bld: 126 mg/dL — ABNORMAL HIGH (ref 70–99)
Potassium: 3.6 mmol/L (ref 3.5–5.1)
Potassium: 3.4 mmol/L — ABNORMAL LOW (ref 3.5–5.1)
Potassium: 3.5 mmol/L (ref 3.5–5.1)
Potassium: 3.4 mmol/L — ABNORMAL LOW (ref 3.5–5.1)
Sodium: 124 mmol/L — ABNORMAL LOW (ref 135–145)
Sodium: 130 mmol/L — ABNORMAL LOW (ref 135–145)
Sodium: 130 mmol/L — ABNORMAL LOW (ref 135–145)
Sodium: 130 mmol/L — ABNORMAL LOW (ref 135–145)

## 2023-09-05 LAB — HEMOGLOBIN AND HEMATOCRIT, BLOOD
HCT: 20.2 % — ABNORMAL LOW (ref 36.0–46.0)
HCT: 20.2 % — ABNORMAL LOW (ref 36.0–46.0)
Hemoglobin: 7.4 g/dL — ABNORMAL LOW (ref 12.0–15.0)
Hemoglobin: 7.4 g/dL — ABNORMAL LOW (ref 12.0–15.0)

## 2023-09-05 LAB — TYPE AND SCREEN
ABO/RH(D): A POS
Antibody Screen: NEGATIVE

## 2023-09-05 LAB — URINALYSIS, ROUTINE W REFLEX MICROSCOPIC
Bilirubin Urine: NEGATIVE
Glucose, UA: NEGATIVE mg/dL
Ketones, ur: NEGATIVE mg/dL
Nitrite: NEGATIVE
Protein, ur: 100 mg/dL — AB
Specific Gravity, Urine: 1.015 (ref 1.005–1.030)
pH: 8.5 — ABNORMAL HIGH (ref 5.0–8.0)

## 2023-09-05 LAB — GLUCOSE, CAPILLARY
Glucose-Capillary: 110 mg/dL — ABNORMAL HIGH (ref 70–99)
Glucose-Capillary: 143 mg/dL — ABNORMAL HIGH (ref 70–99)
Glucose-Capillary: 175 mg/dL — ABNORMAL HIGH (ref 70–99)
Glucose-Capillary: 181 mg/dL — ABNORMAL HIGH (ref 70–99)
Glucose-Capillary: 245 mg/dL — ABNORMAL HIGH (ref 70–99)
Glucose-Capillary: 246 mg/dL — ABNORMAL HIGH (ref 70–99)
Glucose-Capillary: 495 mg/dL — ABNORMAL HIGH (ref 70–99)
Glucose-Capillary: 95 mg/dL (ref 70–99)

## 2023-09-05 LAB — SODIUM: Sodium: 129 mmol/L — ABNORMAL LOW (ref 135–145)

## 2023-09-05 LAB — LACTIC ACID, PLASMA: Lactic Acid, Venous: 2.9 mmol/L (ref 0.5–1.9)

## 2023-09-05 LAB — DIC (DISSEMINATED INTRAVASCULAR COAGULATION)PANEL
D-Dimer, Quant: 6.86 ug{FEU}/mL — ABNORMAL HIGH (ref 0.00–0.50)
Fibrinogen: 270 mg/dL (ref 210–475)
INR: 1.3 — ABNORMAL HIGH (ref 0.8–1.2)
Platelets: 104 10*3/uL — ABNORMAL LOW (ref 150–400)
Prothrombin Time: 16.7 s — ABNORMAL HIGH (ref 11.4–15.2)
Smear Review: NONE SEEN
aPTT: 38 s — ABNORMAL HIGH (ref 24–36)

## 2023-09-05 LAB — MAGNESIUM: Magnesium: 2.3 mg/dL (ref 1.7–2.4)

## 2023-09-05 LAB — ZINC: Zinc: 55 ug/dL (ref 44–115)

## 2023-09-05 LAB — ABO/RH: ABO/RH(D): A POS

## 2023-09-05 MED ORDER — DEXTROSE 5 % IV SOLN: INTRAVENOUS | Status: AC

## 2023-09-05 MED ORDER — INSULIN GLARGINE 100 UNIT/ML ~~LOC~~ SOLN
5.0000 [IU] | Freq: Every day | SUBCUTANEOUS | Status: DC
  Administered 2023-09-05: 5 [IU] via SUBCUTANEOUS
  Filled 2023-09-05 (×2): qty 0.05

## 2023-09-05 MED ORDER — PRISMASOL BGK 4/2.5 32-4-2.5 MEQ/L EC SOLN: Status: DC

## 2023-09-05 MED ORDER — ACETAMINOPHEN 325 MG PO TABS
650.0000 mg | ORAL_TABLET | Freq: Four times a day (QID) | ORAL | Status: DC | PRN
  Administered 2023-09-05 – 2023-09-19 (×12): 650 mg via ORAL
  Filled 2023-09-05 (×12): qty 2

## 2023-09-05 MED ORDER — INSULIN ASPART 100 UNIT/ML IJ SOLN
0.0000 [IU] | INTRAMUSCULAR | Status: DC
  Administered 2023-09-05: 5 [IU] via SUBCUTANEOUS
  Administered 2023-09-05 – 2023-09-06 (×2): 3 [IU] via SUBCUTANEOUS
  Administered 2023-09-06: 8 [IU] via SUBCUTANEOUS
  Administered 2023-09-06: 11 [IU] via SUBCUTANEOUS
  Administered 2023-09-07 (×2): 3 [IU] via SUBCUTANEOUS
  Administered 2023-09-07: 2 [IU] via SUBCUTANEOUS
  Administered 2023-09-08: 8 [IU] via SUBCUTANEOUS
  Administered 2023-09-08 – 2023-09-10 (×10): 3 [IU] via SUBCUTANEOUS
  Administered 2023-09-10: 5 [IU] via SUBCUTANEOUS
  Administered 2023-09-10: 3 [IU] via SUBCUTANEOUS
  Administered 2023-09-11: 8 [IU] via SUBCUTANEOUS
  Administered 2023-09-11: 2 [IU] via SUBCUTANEOUS

## 2023-09-05 NOTE — Progress Notes (Addendum)
 Nutrition Follow Up  DOCUMENTATION CODES:  Severe malnutrition in context of chronic illness, Underweight (recurrent GI surgeries and hospitalizations (5 in the last 6 months), recurrent TPN use)  INTERVENTION:  Recommend TPN given concern for maldigestion and malabsorption with weight loss off TPN, high ileostomy output, and severely malnourished status. If TPN initiated, recommend that is not concentrated due to fluid intake low and ileostomy output high with significant fluid losses  Monitor ileostomy output and adjust fluid recommendations   MVI, IV thiamine and folic acid daily supplementation due to refeeding risk since pt previously malabsorbing  B12 supplementation due to low normal lab value (dropped from 601-->336 in 3 months while off TPN)  Awaiting results of fat soluble vitamin labs: A, E, K along with vitamin C and zinc due to suspected deficiencies/ malabsorption (Vitamin D came back low, dropped from 22.8--> 17.33ng/mL in 1 month)  NUTRITION DIAGNOSIS:  Severe Malnutrition related to chronic illness (recurrent GI surgeries and hospitalizations (5 in the last 6 months), recurrent TPN use) as evidenced by severe fat depletion, severe muscle depletion, percent weight loss.  GOAL:  Patient will meet greater than or equal to 90% of their needs  MONITOR:  Supplement acceptance, PO intake, Labs, Weight trends, I & O's  REASON FOR ASSESSMENT:   Consult Assessment of nutrition requirement/status, Poor PO  ASSESSMENT:   Pt with hx of cervical cancer, SBO (multiple), rectovaginal fistula, hydronephrosis, recurrent AKI, severe dehydration, chronic malnutrition and TPN use. Hx of urethral stent placement (03/2023), R nephrostomy placement (04/2023), anterior resection with colovaginal fistula repair (2021) and ileum cecum resection w/ ileostomy creation (2024). Pt admitted with acute kidney failure and acidosis w/ concerns for uretal obstruction sent by [facility].  Plan  from nephrology to finish current CRRT circuit then discontinue CRRT to see how pt does, nephrology will monitor electrolytes. Hyperkalemia, hyperphosphatemia, and hypomagnesia treated. Monitoring output from ileostomy and nephrostomy.   Checked in on pt today. Husband and friend at bedside. Patient reports she ate majority of her breakfast, RN confirms. Pt ate 2 bananas, grits, scrambled eggs, potatoes, and magic cup. Pt reports feeling better today and no longer having nausea. Pt's output in ileostomy bag 1L in last 24 hours and is green in color which is drastic change from output black in color yesterday now that pt is eating well. Continued suspicion of malabsorption due to output increase and vitamin D levels low (22-->17ng/mL in 1 month) and decrease in B12 status (601--> 336 in 3 months) with normal CRP level. Waiting results of vitamin A, E, and K labs to further understand status of malabsorption.   Husband is adamant about not wanting to restart TPN. He reports having a bad experience with managing the TPN at home by himself and does not want to constantly have to wake up pt to flush the line or administer feeds. Our recommendation is to administer TPN to help pt meets needs to maintain weight and not continue to lose.  Pt has lost close to 30# in 3 months in which she was not relying on TPN for nutrition and will most likely continue to lose weight if malabsorption is the cause of weight loss.   Medications reviewed and include:  Levothyroxine Levo 62mcg/hr Zosyn and vancomycin  Folic acid  MVI w/ minerals Thiamine injection  Labs reviewed:  CBG over last 24 hours: 88-495mg /dL Vitamin D 16.10 Vitamin B12 336<-- 601 (06/10/23) Potassium 3.6 Phosphorus 4.2 Magnesium 2.3 CRP 0.8  Diet Order:   Diet  Order             DIET DYS 3 Fluid consistency: Thin  Diet effective now                  EDUCATION NEEDS:  Education needs have been addressed  Skin:  Skin  Assessment: Reviewed RN Assessment  Last BM:  type 7 3/21  Height:  Ht Readings from Last 1 Encounters:  09/03/23 5\' 2"  (1.575 m)   Weight:  Wt Readings from Last 1 Encounters:  09/05/23 37.2 kg   Ideal Body Weight:  50 kg  BMI:  Body mass index is 15 kg/m.  Estimated Nutritional Needs:   Kcal:  1400-1600  Protein:  70-80g  Fluid:  1 L +UOP  Louis Meckel Dietetic Intern

## 2023-09-05 NOTE — Progress Notes (Signed)
 eLink Physician-Brief Progress Note Patient Name: [name] DOB: 1956/02/21 MRN: [id number]   Date of Service  09/05/2023  HPI/Events of Note  RN requesting Tylenol for pain.  eICU Interventions  Order placed.     Intervention Category Intermediate Interventions: Pain - evaluation and management  Carilyn Goodpasture 09/05/2023, 10:30 PM

## 2023-09-05 NOTE — Progress Notes (Addendum)
 [name] is an 68 y.o. 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  06/2023 right-sided PCN  + ureteral stent. The patient went to [facility] for initial consult of CKD when she was found to have critical lab results and was advised to go to ER. Sodium level 117, potassium 6.6, CO2 10, BUN 169, creatinine level 10.15, anion gap 34, hemoglobin 9.  UA turbid with a chronic proteinuria  Assessment/Plan: # Acute kidney injury concerning for obstructive uropathy and may have ischemic ATN with reduced intravascular volume.  Patient with history of bilateral hydronephrosis and has right nephrostomy tube.  Now with severe metabolic derangement with hyperkalemia, severe acidosis and uremic. CT AP atrophic LK, no hydro on rt.   Continue CRRT with low blood flow rate and low dialysate flow rate because of very high BUN and hyponatremia. DDAVP was given before RIJ temp cath placement.  Maintain positive fluid balance of 100 cc an hour; will change preplacement also to 4/2.5.  Continue D5W at 100 cc an hour. Already discussed with RN who is bedside that she is about declot would just rinse back.  Strict ins and out and monitor lab closely   # Severe hyponatremia, subacute to chronic: We will aim for sodium level around 130-132 Sat 10AM Reducing dialysis flow rates to minimize overcorrection.  Na level already 129 and mental status much improved.  Check Na q2 hr.   # Severe hyperkalemia: Treating medically in ER.  CRRT with 0 potassium bath -> K now 3.5 -> All 4K baths today.   # Anion gap metabolic  acidosis with AKI: Sodium bicarbonate with prefilter and CRRT as above.   # History of obstructive uropathy, bilateral hydronephrosis: Review CT scan of abdomen pelvis point done -> no hydro RK.    Subjective: Mental status markedly improved and conversing appropriately; denies nausea vomiting shortness of breath.     Chemistry and CBC: Creatinine, Ser  Date/Time Value Ref Range Status  09/05/2023 03:07 AM 3.32 (H) 0.44 - 1.00 mg/dL Final    Comment:    DELTA CHECK NOTED  09/04/2023 04:26 PM 5.05 (H) 0.44 - 1.00 mg/dL Final    Comment:    DELTA CHECK NOTED  09/04/2023 01:32 PM 5.60 (H) 0.44 - 1.00 mg/dL Final  56/43/3295 18:84 AM 6.12 (H) 0.44 - 1.00 mg/dL Final  16/60/6301 60:10 AM 6.97 (H) 0.44 - 1.00 mg/dL Final  93/23/5573 22:02 AM 7.69 (H) 0.44 - 1.00 mg/dL Final  54/27/0623 76:28 AM 10.11 (H) 0.44 - 1.00 mg/dL Final  31/51/7616 07:37 PM 10.34 (H) 0.44 - 1.00 mg/dL Final  10/62/6948 54:62 PM 10.15 (H) 0.44 - 1.00 mg/dL Final  70/35/0093 81:82 AM 1.22 (H) 0.44 - 1.00 mg/dL Final  99/37/1696 78:93 AM 1.20 (H) 0.44 - 1.00 mg/dL Final  81/06/7508 25:85 AM 0.93 0.44 - 1.00 mg/dL Final  27/78/2423 53:61 AM 0.95 0.44 - 1.00 mg/dL Final  44/31/5400 86:76 AM 0.92 0.44 - 1.00 mg/dL Final  19/50/9326 71:24 AM 1.23 (H) 0.44 - 1.00 mg/dL Final  58/02/9832 82:50 AM 1.24 (H) 0.44 - 1.00 mg/dL Final  53/97/6734 19:37 AM 1.21 (H) 0.44 - 1.00 mg/dL Final  90/24/0973 53:29  AM 0.97 0.44 - 1.00 mg/dL Final  29/51/8841 66:06 AM 0.82 0.44 - 1.00 mg/dL Final  30/16/0109 32:35 AM 0.77 0.44 - 1.00 mg/dL Final  57/32/2025 42:70 AM 0.88 0.44 - 1.00 mg/dL Final  62/37/6283 15:17 AM 0.94 0.44 - 1.00 mg/dL Final  61/60/7371 06:26 AM 1.26 (H) 0.44 - 1.00 mg/dL Final  94/85/4627 03:50 PM 1.44 (H) 0.44 - 1.00 mg/dL Final  09/38/1829 93:71 AM 1.81 (H) 0.44 - 1.00 mg/dL Final  69/67/8938 10:17 PM 2.07 (H) 0.44 - 1.00 mg/dL Final  51/07/5850 77:82 PM 2.44 (H) 0.44 - 1.00 mg/dL Final  42/35/3614 43:15 AM  1.38 (H) 0.44 - 1.00 mg/dL Final  40/01/6760 95:09 PM 1.42 (H) 0.44 - 1.00 mg/dL Final  32/67/1245 80:99 AM 0.95 0.44 - 1.00 mg/dL Final  83/38/2505 39:76 AM 1.15 (H) 0.44 - 1.00 mg/dL Final  73/41/9379 02:40 PM 1.32 (H) 0.44 - 1.00 mg/dL Final  97/35/3299 24:26 AM 1.08 (H) 0.44 - 1.00 mg/dL Final  83/41/9622 29:79 AM 1.23 (H) 0.44 - 1.00 mg/dL Final  89/21/1941 74:08 AM 1.48 (H) 0.44 - 1.00 mg/dL Final  14/48/1856 31:49 AM 1.58 (H) 0.44 - 1.00 mg/dL Final  70/26/3785 88:50 AM 1.05 (H) 0.57 - 1.00 mg/dL Final  27/74/1287 86:76 AM 1.11 (H) 0.57 - 1.00 mg/dL Final  72/02/4708 62:83 AM 1.10 (H) 0.57 - 1.00 mg/dL Final  66/29/4765 46:50 AM 1.55 (H) 0.57 - 1.00 mg/dL Final  35/46/5681 27:51 AM 1.12 (H) 0.57 - 1.00 mg/dL Final  70/06/7492 49:67 PM 1.01 (H) 0.44 - 1.00 mg/dL Final  59/16/3846 65:99 AM 1.26 (H) 0.57 - 1.00 mg/dL Final  35/70/1779 39:03 AM 1.06 (H) 0.57 - 1.00 mg/dL Final  00/92/3300 76:22 AM 1.16 (H) 0.57 - 1.00 mg/dL Final  63/33/5456 25:63 PM 0.98 0.44 - 1.00 mg/dL Final  89/37/3428 76:81 PM 0.99 0.57 - 1.00 mg/dL Final  15/72/6203 55:97 PM 0.90 0.57 - 1.00 mg/dL Final  41/63/8453 64:68 AM 1.02 (H) 0.57 - 1.00 mg/dL Final  09/05/2246 25:00 AM 1.00 0.50 - 1.10 mg/dL Final   Recent Labs  Lab 09/04/23 0001 09/04/23 0620 09/04/23 0800 09/04/23 0923 09/04/23 1128 09/04/23 1332 09/04/23 1626 09/04/23 2000 09/04/23 2356 09/05/23 0307  NA 121* 122*   < > 125* 123* 121* 123*  120* 126* 129* 129*  K 5.5* 3.9  --  4.0 3.7 3.7 3.8  --   --  3.5  CL 73* 81*  --  81* 84* 82* 82*  --   --  89*  CO2 18* 18*  --  21* 20* 23 22  --   --  25  GLUCOSE 83 137*  --  144* 198* 250* 307*  --   --  136*  BUN 171* 135*  --  116* 100* 96* 75*  --   --  46*  CREATININE 10.11* 7.69*  --  6.97* 6.12* 5.60* 5.05*  --   --  3.32*  CALCIUM 8.5* 8.0*  --  8.1* 7.8* 7.7* 7.8*  --   --  8.3*  PHOS >30.0* 9.3*  --   --   --   --  5.3*  --   --  4.2   < > = values in this interval not displayed.    Recent Labs  Lab 09/03/23 2220 09/04/23 0001 09/04/23 2025 09/05/23 0307 09/05/23 0454  WBC 11.9* 11.9* 10.0 6.6  --   NEUTROABS 10.4*  --   --   --   --   HGB 15.2* 12.5  8.8* 7.2* 7.4*  HCT RESULTS UNAVAILABLE DUE TO INTERFERING SUBSTANCE 33.3* 23.9* 19.4* 20.2*  MCV RESULTS UNAVAILABLE DUE TO INTERFERING SUBSTANCE 88.1 90.9 91.5  --   PLT 334 339 178 94* 104*   Liver Function Tests: Recent Labs  Lab 09/03/23 1703 09/04/23 0620 09/04/23 1626 09/05/23 0307  AST 30  --   --   --   ALT 54*  --   --   --   ALKPHOS 195*  --   --   --   BILITOT 0.8  --   --   --   PROT 8.0  --   --   --   ALBUMIN 4.3 3.4* 2.9* 3.3*   No results for input(s): "LIPASE", "AMYLASE" in the last 168 hours. No results for input(s): "AMMONIA" in the last 168 hours. Cardiac Enzymes: No results for input(s): "CKTOTAL", "CKMB", "CKMBINDEX", "TROPONINI" in the last 168 hours. Iron Studies: No results for input(s): "IRON", "TIBC", "TRANSFERRIN", "FERRITIN" in the last 72 hours. PT/INR: @LABRCNTIP (inr:5)  Xrays/Other Studies: ) Results for orders placed or performed during the hospital encounter of 09/03/23 (from the past 48 hours)  Comprehensive metabolic panel     Status: Abnormal   Collection Time: 09/03/23  5:03 PM  Result Value Ref Range   Sodium 117 (LL) 135 - 145 mmol/L    Comment: CRITICAL RESULT CALLED TO, READ BACK BY AND VERIFIED WITH CHRIS CHRISCO, RN @ 1819 09/03/23 BY SEKDAHL   Potassium 6.6 (HH) 3.5 - 5.1 mmol/L    Comment: CRITICAL RESULT CALLED TO, READ BACK BY AND VERIFIED WITH CHRIS CHRISCO, RN @ 1819 09/03/23 BY SEKDAHL   Chloride 73 (L) 98 - 111 mmol/L   CO2 10 (L) 22 - 32 mmol/L   Glucose, Bld 255 (H) 70 - 99 mg/dL    Comment: Glucose reference range applies only to samples taken after fasting for at least 8 hours.   BUN 169 (H) 8 - 23 mg/dL   Creatinine, Ser 03.47 (H) 0.44 - 1.00 mg/dL   Calcium 9.1 8.9 - 42.5 mg/dL   Total Protein 8.0 6.5 - 8.1 g/dL   Albumin 4.3 3.5 - 5.0  g/dL   AST 30 15 - 41 U/L   ALT 54 (H) 0 - 44 U/L   Alkaline Phosphatase 195 (H) 38 - 126 U/L   Total Bilirubin 0.8 0.0 - 1.2 mg/dL   GFR, Estimated 4 (L) >60 mL/min    Comment: (NOTE) Calculated using the CKD-EPI Creatinine Equation (2021)    Anion gap 34 (H) 5 - 15    Comment: ELECTROLYTES REPEATED TO VERIFY Performed at [facility], [address], [place], [place] [address]   Urinalysis, Routine w reflex microscopic -Urine, Clean Catch     Status: Abnormal   Collection Time: 09/03/23  5:03 PM  Result Value Ref Range   Color, Urine YELLOW YELLOW   APPearance TURBID (A) CLEAR   Specific Gravity, Urine 1.012 1.005 - 1.030   pH 6.0 5.0 - 8.0   Glucose, UA NEGATIVE NEGATIVE mg/dL   Hgb urine dipstick SMALL (A) NEGATIVE   Bilirubin Urine NEGATIVE NEGATIVE   Ketones, ur NEGATIVE NEGATIVE mg/dL   Protein, ur 756 (A) NEGATIVE mg/dL   Nitrite NEGATIVE NEGATIVE   Leukocytes,Ua SMALL (A) NEGATIVE   RBC / HPF 6-10 0 - 5 RBC/hpf   WBC, UA >50 0 - 5 WBC/hpf   Bacteria, UA RARE (A) NONE SEEN   Squamous Epithelial / HPF 0-5 0 - 5 /  HPF   WBC Clumps PRESENT     Comment: Performed at [facility], [address] [place], [place] [address]  Lactic acid, plasma     Status: Abnormal   Collection Time: 09/03/23  8:31 PM  Result Value Ref Range   Lactic Acid, Venous 3.6 (HH) 0.5 - 1.9 mmol/L    Comment: CRITICAL RESULT CALLED TO, READ BACK BY AND VERIFIED WITH N.HARDY RN 2146 09/03/2023 BY G.GANADEN 1ST ATTEMPT TO CALL 2124 09/03/2023 Performed at [facility], [address] [place], [place] [address]   Creatinine, serum     Status: Abnormal   Collection Time: 09/03/23  8:31 PM  Result Value Ref Range   Creatinine, Ser 10.34 (H) 0.44 - 1.00 mg/dL   GFR, Estimated 4 (L) >60 mL/min    Comment: (NOTE) Calculated using the CKD-EPI Creatinine Equation (2021) Performed at [facility], [address] [place], [place] [address]   Sodium     Status: Abnormal    Collection Time: 09/03/23  8:31 PM  Result Value Ref Range   Sodium 119 (LL) 135 - 145 mmol/L    Comment: CRITICAL RESULT CALLED TO, READ BACK BY AND VERIFIED WITH N.HARDY RN 2146 09/03/2023 BY G.GANADEN Performed at [facility], [address], [place], [place] [address]   CBG monitoring, ED     Status: Abnormal   Collection Time: 09/03/23  9:12 PM  Result Value Ref Range   Glucose-Capillary 127 (H) 70 - 99 mg/dL    Comment: Glucose reference range applies only to samples taken after fasting for at least 8 hours.  Hemoglobin A1c     Status: Abnormal   Collection Time: 09/03/23 10:20 PM  Result Value Ref Range   Hgb A1c MFr Bld 5.7 (H) 4.8 - 5.6 %    Comment: (NOTE) Pre diabetes:          5.7%-6.4%  Diabetes:              >6.4%  Glycemic control for   <7.0% adults with diabetes    Mean Plasma Glucose 116.89 mg/dL    Comment: Performed at [facility], [address] [place], [place] [address]  Protime-INR     Status: None   Collection Time: 09/03/23 10:20 PM  Result Value Ref Range   Prothrombin Time 14.9 11.4 - 15.2 seconds   INR 1.1 0.8 - 1.2    Comment: (NOTE) INR goal varies based on device and disease states. Performed at [facility], [address] [place], [place] [address]   APTT     Status: None   Collection Time: 09/03/23 10:20 PM  Result Value Ref Range   aPTT 28 24 - 36 seconds    Comment: Performed at [facility], [address], [place], [place] [address]  CBC with Differential/Platelet     Status: Abnormal   Collection Time: 09/03/23 10:20 PM  Result Value Ref Range   WBC 11.9 (H) 4.0 - 10.5 K/uL   RBC RESULTS UNAVAILABLE DUE TO INTERFERING SUBSTANCE 3.87 - 5.11 MIL/uL   Hemoglobin 15.2 (H) 12.0 - 15.0 g/dL   HCT RESULTS UNAVAILABLE DUE TO INTERFERING SUBSTANCE 36.0 - 46.0 %   MCV RESULTS UNAVAILABLE DUE TO INTERFERING SUBSTANCE 80.0 - 100.0 fL   MCH RESULTS UNAVAILABLE DUE TO INTERFERING SUBSTANCE 26.0 - 34.0 pg   MCHC  RESULTS UNAVAILABLE DUE TO INTERFERING SUBSTANCE 30.0 - 36.0 g/dL   RDW RESULTS  UNAVAILABLE DUE TO INTERFERING SUBSTANCE 11.5 - 15.5 %   Platelets 334 150 - 400 K/uL   nRBC 0.0 0.0 - 0.2 %   Neutrophils Relative % 87 %   Neutro Abs 10.4 (H) 1.7 - 7.7 K/uL   Lymphocytes Relative 6 %   Lymphs Abs 0.7 0.7 - 4.0 K/uL   Monocytes Relative 6 %   Monocytes Absolute 0.7 0.1 - 1.0 K/uL   Eosinophils Relative 0 %   Eosinophils Absolute 0.0 0.0 - 0.5 K/uL   Basophils Relative 0 %   Basophils Absolute 0.0 0.0 - 0.1 K/uL   Immature Granulocytes 1 %   Abs Immature Granulocytes 0.15 (H) 0.00 - 0.07 K/uL    Comment: Performed at [facility], [address], [place], [place] [address]  Culture, blood (Routine X 2) w Reflex to ID Panel     Status: None (Preliminary result)   Collection Time: 09/03/23 10:45 PM   Specimen: BLOOD RIGHT ARM  Result Value Ref Range   Specimen Description BLOOD RIGHT ARM    Special Requests      BOTTLES DRAWN AEROBIC AND ANAEROBIC Blood Culture results may not be optimal due to an inadequate volume of blood received in culture bottles   Culture      NO GROWTH 2 DAYS Performed at [facility], [address] [place], [place] [address]    Report Status PENDING   MRSA Next Gen by PCR, Nasal     Status: None   Collection Time: 09/03/23 10:51 PM   Specimen: Nasal Mucosa; Nasal Swab  Result Value Ref Range   MRSA by PCR Next Gen NOT DETECTED NOT DETECTED    Comment: (NOTE) The GeneXpert MRSA Assay (FDA approved for NASAL specimens only), is one component of a comprehensive MRSA colonization surveillance program. It is not intended to diagnose MRSA infection nor to guide or monitor treatment for MRSA infections. Test performance is not FDA approved in patients less than 70 years old. Performed at [facility], [address] [place], [place] [address]   Lactic acid, plasma     Status: None   Collection Time: 09/04/23 12:01 AM  Result Value Ref  Range   Lactic Acid, Venous 1.8 0.5 - 1.9 mmol/L    Comment: Performed at [facility], [address], [place], [place] [address]  CBC     Status: Abnormal   Collection Time: 09/04/23 12:01 AM  Result Value Ref Range   WBC 11.9 (H) 4.0 - 10.5 K/uL   RBC 3.78 (L) 3.87 - 5.11 MIL/uL   Hemoglobin 12.5 12.0 - 15.0 g/dL   HCT 65.7 (L) 84.6 - 96.2 %   MCV 88.1 80.0 - 100.0 fL   MCH 33.1 26.0 - 34.0 pg   MCHC 37.5 (H) 30.0 - 36.0 g/dL    Comment: CORRECTED FOR COLD AGGLUTININS   RDW 17.6 (H) 11.5 - 15.5 %   Platelets 339 150 - 400 K/uL   nRBC 0.0 0.0 - 0.2 %    Comment: Performed at [facility], [address], [place], [place] [address]  Basic metabolic panel     Status: Abnormal   Collection Time: 09/04/23 12:01 AM  Result Value Ref Range   Sodium 121 (L) 135 - 145 mmol/L   Potassium 5.5 (H) 3.5 - 5.1 mmol/L   Chloride 73 (L) 98 - 111 mmol/L   CO2 18 (L) 22 - 32 mmol/L   Glucose, Bld 83  70 - 99 mg/dL    Comment: Glucose reference range applies only to samples taken after fasting for at least 8 hours.   BUN 171 (H) 8 - 23 mg/dL   Creatinine, Ser 96.04 (H) 0.44 - 1.00 mg/dL   Calcium 8.5 (L) 8.9 - 10.3 mg/dL   GFR, Estimated 4 (L) >60 mL/min    Comment: (NOTE) Calculated using the CKD-EPI Creatinine Equation (2021)    Anion gap 30 (H) 5 - 15    Comment: ELECTROLYTES REPEATED TO VERIFY Performed at [facility], [address] [place], [place] [address]   Magnesium     Status: Abnormal   Collection Time: 09/04/23 12:01 AM  Result Value Ref Range   Magnesium 1.4 (L) 1.7 - 2.4 mg/dL    Comment: Performed at [facility], [address], [place], [place] [address]  Phosphorus     Status: Abnormal   Collection Time: 09/04/23 12:01 AM  Result Value Ref Range   Phosphorus >30.0 (H) 2.5 - 4.6 mg/dL    Comment: RESULT CONFIRMED BY MANUAL DILUTION Performed at [facility], [address] [place], [place] [address]   Procalcitonin     Status: None    Collection Time: 09/04/23 12:01 AM  Result Value Ref Range   Procalcitonin 0.82 ng/mL    Comment:        Interpretation: PCT > 0.5 ng/mL and <= 2 ng/mL: Systemic infection (sepsis) is possible, but other conditions are known to elevate PCT as well. (NOTE)       Sepsis PCT Algorithm           Lower Respiratory Tract                                      Infection PCT Algorithm    ----------------------------     ----------------------------         PCT < 0.25 ng/mL                PCT < 0.10 ng/mL          Strongly encourage             Strongly discourage   discontinuation of antibiotics    initiation of antibiotics    ----------------------------     -----------------------------       PCT 0.25 - 0.50 ng/mL            PCT 0.10 - 0.25 ng/mL               OR       >80% decrease in PCT            Discourage initiation of                                            antibiotics      Encourage discontinuation           of antibiotics    ----------------------------     -----------------------------         PCT >= 0.50 ng/mL              PCT 0.26 - 0.50 ng/mL                AND       <  80% decrease in PCT             Encourage initiation of                                             antibiotics       Encourage continuation           of antibiotics    ----------------------------     -----------------------------        PCT >= 0.50 ng/mL                  PCT > 0.50 ng/mL               AND         increase in PCT                  Strongly encourage                                      initiation of antibiotics    Strongly encourage escalation           of antibiotics                                     -----------------------------                                           PCT <= 0.25 ng/mL                                                 OR                                        > 80% decrease in PCT                                      Discontinue / Do not initiate                                              antibiotics  Performed at [facility], [address] [place], [place] [address]   Glucose, capillary     Status: None   Collection Time: 09/04/23 12:04 AM  Result Value Ref Range   Glucose-Capillary 88 70 - 99 mg/dL    Comment: Glucose reference range applies only to samples taken after fasting for at least 8 hours.  Glucose, capillary     Status: Abnormal   Collection Time: 09/04/23  4:39 AM  Result Value Ref Range   Glucose-Capillary 152 (H) 70 - 99 mg/dL    Comment: Glucose reference range applies only to samples taken after fasting  for at least 8 hours.  Culture, blood (Routine X 2) w Reflex to ID Panel     Status: None (Preliminary result)   Collection Time: 09/04/23  6:18 AM   Specimen: BLOOD RIGHT HAND  Result Value Ref Range   Specimen Description BLOOD RIGHT HAND    Special Requests      BOTTLES DRAWN AEROBIC AND ANAEROBIC Blood Culture adequate volume   Culture      NO GROWTH < 24 HOURS Performed at [facility], [address] [place] [address], [place], [place] [address]    Report Status PENDING   Magnesium     Status: Abnormal   Collection Time: 09/04/23  6:18 AM  Result Value Ref Range   Magnesium 1.5 (L) 1.7 - 2.4 mg/dL    Comment: Performed at [facility], [address], [place], [place] [address]  Renal function panel (daily at 0500)     Status: Abnormal   Collection Time: 09/04/23  6:20 AM  Result Value Ref Range   Sodium 122 (L) 135 - 145 mmol/L   Potassium 3.9 3.5 - 5.1 mmol/L   Chloride 81 (L) 98 - 111 mmol/L   CO2 18 (L) 22 - 32 mmol/L   Glucose, Bld 137 (H) 70 - 99 mg/dL    Comment: Glucose reference range applies only to samples taken after fasting for at least 8 hours.   BUN 135 (H) 8 - 23 mg/dL   Creatinine, Ser 0.10 (H) 0.44 - 1.00 mg/dL   Calcium 8.0 (L) 8.9 - 10.3 mg/dL   Phosphorus 9.3 (H) 2.5 - 4.6 mg/dL   Albumin 3.4 (L) 3.5 - 5.0 g/dL   GFR, Estimated 5 (L) >60 mL/min    Comment: (NOTE) Calculated using  the CKD-EPI Creatinine Equation (2021)    Anion gap 23 (H) 5 - 15    Comment: ELECTROLYTES REPEATED TO VERIFY Performed at [facility], [address], [place], [place] [address]   Culture, blood (Routine X 2) w Reflex to ID Panel     Status: None (Preliminary result)   Collection Time: 09/04/23  6:21 AM   Specimen: BLOOD RIGHT ARM  Result Value Ref Range   Specimen Description BLOOD RIGHT ARM    Special Requests      BOTTLES DRAWN AEROBIC AND ANAEROBIC Blood Culture adequate volume   Culture      NO GROWTH < 24 HOURS Performed at [facility], [address] [place], [place] [address]    Report Status PENDING   Sodium     Status: Abnormal   Collection Time: 09/04/23  8:00 AM  Result Value Ref Range   Sodium 125 (L) 135 - 145 mmol/L    Comment: DELTA CHECK NOTED Performed at [facility], [address] [place], [place] [address]   Glucose, capillary     Status: Abnormal   Collection Time: 09/04/23  8:16 AM  Result Value Ref Range   Glucose-Capillary 129 (H) 70 - 99 mg/dL    Comment: Glucose reference range applies only to samples taken after fasting for at least 8 hours.  Basic metabolic panel     Status: Abnormal   Collection Time: 09/04/23  9:23 AM  Result Value Ref Range   Sodium 125 (L) 135 - 145 mmol/L   Potassium 4.0 3.5 - 5.1 mmol/L   Chloride 81 (L) 98 - 111 mmol/L   CO2 21 (L) 22 - 32 mmol/L   Glucose, Bld 144 (H)  70 - 99 mg/dL    Comment: Glucose reference range applies only to samples taken after fasting for at least 8 hours.   BUN 116 (H) 8 - 23 mg/dL   Creatinine, Ser 1.61 (H) 0.44 - 1.00 mg/dL   Calcium 8.1 (L) 8.9 - 10.3 mg/dL   GFR, Estimated 6 (L) >60 mL/min    Comment: (NOTE) Calculated using the CKD-EPI Creatinine Equation (2021)    Anion gap 23 (H) 5 - 15    Comment: ELECTROLYTES REPEATED TO VERIFY Performed at [facility], [address], [place], [place] [address]   Basic metabolic panel     Status: Abnormal    Collection Time: 09/04/23 11:28 AM  Result Value Ref Range   Sodium 123 (L) 135 - 145 mmol/L   Potassium 3.7 3.5 - 5.1 mmol/L   Chloride 84 (L) 98 - 111 mmol/L   CO2 20 (L) 22 - 32 mmol/L   Glucose, Bld 198 (H) 70 - 99 mg/dL    Comment: Glucose reference range applies only to samples taken after fasting for at least 8 hours.   BUN 100 (H) 8 - 23 mg/dL   Creatinine, Ser 5.40 (H) 0.44 - 1.00 mg/dL   Calcium 7.8 (L) 8.9 - 10.3 mg/dL   GFR, Estimated 7 (L) >60 mL/min    Comment: (NOTE) Calculated using the CKD-EPI Creatinine Equation (2021)    Anion gap 19 (H) 5 - 15    Comment: Performed at [facility], [address], [place], [place] [address]  Glucose, capillary     Status: Abnormal   Collection Time: 09/04/23 11:42 AM  Result Value Ref Range   Glucose-Capillary 242 (H) 70 - 99 mg/dL    Comment: Glucose reference range applies only to samples taken after fasting for at least 8 hours.  Basic metabolic panel     Status: Abnormal   Collection Time: 09/04/23  1:32 PM  Result Value Ref Range   Sodium 121 (L) 135 - 145 mmol/L   Potassium 3.7 3.5 - 5.1 mmol/L   Chloride 82 (L) 98 - 111 mmol/L   CO2 23 22 - 32 mmol/L   Glucose, Bld 250 (H) 70 - 99 mg/dL    Comment: Glucose reference range applies only to samples taken after fasting for at least 8 hours.   BUN 96 (H) 8 - 23 mg/dL   Creatinine, Ser 1.47 (H) 0.44 - 1.00 mg/dL   Calcium 7.7 (L) 8.9 - 10.3 mg/dL   GFR, Estimated 8 (L) >60 mL/min    Comment: (NOTE) Calculated using the CKD-EPI Creatinine Equation (2021)    Anion gap 16 (H) 5 - 15    Comment: Performed at [facility], [address], [place], [place] [address]  Renal function panel (daily at 1600)     Status: Abnormal   Collection Time: 09/04/23  4:26 PM  Result Value Ref Range   Sodium 123 (L) 135 - 145 mmol/L    Comment: DELTA CHECK NOTED   Potassium 3.8 3.5 - 5.1 mmol/L    Comment: DELTA CHECK NOTED   Chloride 82 (L) 98 - 111 mmol/L   CO2 22 22 - 32  mmol/L   Glucose, Bld 307 (H) 70 - 99 mg/dL    Comment: Glucose reference range applies only to samples taken after fasting for at least 8 hours.   BUN 75 (H) 8 - 23 mg/dL   Creatinine, Ser 2.13 (H) 0.44 - 1.00 mg/dL  Comment: DELTA CHECK NOTED   Calcium 7.8 (L) 8.9 - 10.3 mg/dL   Phosphorus 5.3 (H) 2.5 - 4.6 mg/dL   Albumin 2.9 (L) 3.5 - 5.0 g/dL   GFR, Estimated 9 (L) >60 mL/min    Comment: (NOTE) Calculated using the CKD-EPI Creatinine Equation (2021)    Anion gap 19 (H) 5 - 15    Comment: Performed at [facility], [address] [place], [place] [address]  Sodium     Status: Abnormal   Collection Time: 09/04/23  4:26 PM  Result Value Ref Range   Sodium 120 (L) 135 - 145 mmol/L    Comment: Performed at [facility], [address], [place], [place] [address]  C-reactive protein     Status: None   Collection Time: 09/04/23  4:26 PM  Result Value Ref Range   CRP 0.8 <1.0 mg/dL    Comment: Performed at [facility], [address] [place], [place] [address]  VITAMIN D 25 Hydroxy (Vit-D Deficiency, Fractures)     Status: Abnormal   Collection Time: 09/04/23  4:26 PM  Result Value Ref Range   Vit D, 25-Hydroxy 17.33 (L) 30 - 100 ng/mL    Comment: (NOTE) Vitamin D deficiency has been defined by the Institute of Medicine  and an Endocrine Society practice guideline as a level of serum 25-OH  vitamin D less than 20 ng/mL (1,2). The Endocrine Society went on to  further define vitamin D insufficiency as a level between 21 and 29  ng/mL (2).  1. IOM (Institute of Medicine). 2010. Dietary reference intakes for  calcium and D. [place]: [employer]. 2. Holick MF, Binkley Sedro-Woolley, Bischoff-Ferrari HA, et al. Evaluation,  treatment, and prevention of vitamin D deficiency: an Endocrine  Society clinical practice guideline, JCEM. 2011 Jul; 96(7): 1911-30.  Performed at [facility], [address] [place], [place] [address]   Vitamin B12      Status: None   Collection Time: 09/04/23  4:31 PM  Result Value Ref Range   Vitamin B-12 336 180 - 914 pg/mL    Comment: (NOTE) This assay is not validated for testing neonatal or myeloproliferative syndrome specimens for Vitamin B12 levels. Performed at [facility], [address] [place], [place] [address]   Folate     Status: None   Collection Time: 09/04/23  4:31 PM  Result Value Ref Range   Folate 16.9 >5.9 ng/mL    Comment: Performed at [facility], [address] [place], [place] [address]  Glucose, capillary     Status: Abnormal   Collection Time: 09/04/23  4:53 PM  Result Value Ref Range   Glucose-Capillary 306 (H) 70 - 99 mg/dL    Comment: Glucose reference range applies only to samples taken after fasting for at least 8 hours.  CG4 I-STAT (Lactic acid)     Status: Abnormal   Collection Time: 09/04/23  6:18 PM  Result Value Ref Range   Lactic Acid, Venous 6.6 (HH) 0.5 - 1.9 mmol/L  Sodium     Status: Abnormal   Collection Time: 09/04/23  8:00 PM  Result Value Ref Range   Sodium 126 (L) 135 - 145 mmol/L    Comment: DELTA CHECK NOTED Performed at [facility], [address], [place], [place] [address]   Glucose, capillary     Status: Abnormal   Collection Time: 09/04/23  8:04 PM  Result Value  Ref Range   Glucose-Capillary 156 (H) 70 - 99 mg/dL    Comment: Glucose reference range applies only to samples taken after fasting for at least 8 hours.  CBC     Status: Abnormal   Collection Time: 09/04/23  8:25 PM  Result Value Ref Range   WBC 10.0 4.0 - 10.5 K/uL   RBC 2.63 (L) 3.87 - 5.11 MIL/uL   Hemoglobin 8.8 (L) 12.0 - 15.0 g/dL    Comment: REPEATED TO VERIFY RECOLLECTED SAMPLE    HCT 23.9 (L) 36.0 - 46.0 %   MCV 90.9 80.0 - 100.0 fL   MCH 33.5 26.0 - 34.0 pg   MCHC 36.8 (H) 30.0 - 36.0 g/dL    Comment: CORRECTED FOR COLD AGGLUTININS   RDW 17.8 (H) 11.5 - 15.5 %   Platelets 178 150 - 400 K/uL    Comment: REPEATED TO VERIFY   nRBC 0.0  0.0 - 0.2 %    Comment: Performed at [facility], [address] [place], [place] [address]  Lactic acid, plasma     Status: Abnormal   Collection Time: 09/04/23  9:00 PM  Result Value Ref Range   Lactic Acid, Venous 4.0 (HH) 0.5 - 1.9 mmol/L    Comment: CRITICAL RESULT CALLED TO, READ BACK BY AND VERIFIED WITH Leotis Shames, RN [id number] 09/04/2023 SANDOVAL K Performed at [facility], [address], [place], [place] [address]   Sodium     Status: Abnormal   Collection Time: 09/04/23 11:56 PM  Result Value Ref Range   Sodium 129 (L) 135 - 145 mmol/L    Comment: DELTA CHECK NOTED Performed at [facility], [address] [place], [place] [address]   Glucose, capillary     Status: Abnormal   Collection Time: 09/05/23 12:00 AM  Result Value Ref Range   Glucose-Capillary 110 (H) 70 - 99 mg/dL    Comment: Glucose reference range applies only to samples taken after fasting for at least 8 hours.  Renal function panel (daily at 0500)     Status: Abnormal   Collection Time: 09/05/23  3:07 AM  Result Value Ref Range   Sodium 129 (L) 135 - 145 mmol/L    Comment: DELTA CHECK NOTED   Potassium 3.5 3.5 - 5.1 mmol/L   Chloride 89 (L) 98 - 111 mmol/L   CO2 25 22 - 32 mmol/L   Glucose, Bld 136 (H) 70 - 99 mg/dL    Comment: Glucose reference range applies only to samples taken after fasting for at least 8 hours.   BUN 46 (H) 8 - 23 mg/dL   Creatinine, Ser 4.69 (H) 0.44 - 1.00 mg/dL    Comment: DELTA CHECK NOTED   Calcium 8.3 (L) 8.9 - 10.3 mg/dL   Phosphorus 4.2 2.5 - 4.6 mg/dL   Albumin 3.3 (L) 3.5 - 5.0 g/dL   GFR, Estimated 15 (L) >60 mL/min    Comment: (NOTE) Calculated using the CKD-EPI Creatinine Equation (2021)    Anion gap 15 5 - 15    Comment: Performed at [facility], [address], [place], [place] [address]  Magnesium     Status: None   Collection Time: 09/05/23  3:07 AM  Result Value Ref Range   Magnesium 2.3 1.7 - 2.4 mg/dL    Comment: Performed at  [facility], [address], [place], [place] [address]  CBC     Status: Abnormal   Collection Time:  09/05/23  3:07 AM  Result Value Ref Range   WBC 6.6 4.0 - 10.5 K/uL   RBC 2.12 (L) 3.87 - 5.11 MIL/uL   Hemoglobin 7.2 (L) 12.0 - 15.0 g/dL   HCT 34.7 (L) 42.5 - 95.6 %   MCV 91.5 80.0 - 100.0 fL   MCH 34.0 26.0 - 34.0 pg   MCHC 37.1 (H) 30.0 - 36.0 g/dL   RDW 38.7 (H) 56.4 - 33.2 %   Platelets 94 (L) 150 - 400 K/uL    Comment: Immature Platelet Fraction may be clinically indicated, consider ordering this additional test [id number] REPEATED TO VERIFY PLATELET COUNT CONFIRMED BY SMEAR    nRBC 0.0 0.0 - 0.2 %    Comment: Performed at [facility], [address] [place], [place] [address]  ABO/Rh     Status: None   Collection Time: 09/05/23  3:07 AM  Result Value Ref Range   ABO/RH(D)      A POS Performed at [facility], [address], [place], [place] [address]   Glucose, capillary     Status: Abnormal   Collection Time: 09/05/23  3:12 AM  Result Value Ref Range   Glucose-Capillary 143 (H) 70 - 99 mg/dL    Comment: Glucose reference range applies only to samples taken after fasting for at least 8 hours.  Hemoglobin and hematocrit, blood     Status: Abnormal   Collection Time: 09/05/23  4:54 AM  Result Value Ref Range   Hemoglobin 7.4 (L) 12.0 - 15.0 g/dL   HCT 93.2 (L) 35.5 - 73.2 %    Comment: Performed at [facility], [address] [place], [place] [address]  Type and screen [facility]     Status: None   Collection Time: 09/05/23  4:54 AM  Result Value Ref Range   ABO/RH(D) A POS    Antibody Screen NEG    Sample Expiration      09/08/2023,2359 Performed at [facility], [address] [place], [place] [address]   DIC Panel ONCE - STAT     Status: Abnormal   Collection Time: 09/05/23  4:54 AM  Result Value Ref Range   Prothrombin Time 16.7 (H) 11.4 - 15.2 seconds   INR 1.3 (H) 0.8 - 1.2    Comment: (NOTE) INR  goal varies based on device and disease states.    aPTT 38 (H) 24 - 36 seconds    Comment:        IF BASELINE aPTT IS ELEVATED, SUGGEST PATIENT RISK ASSESSMENT BE USED TO DETERMINE APPROPRIATE ANTICOAGULANT THERAPY.    Fibrinogen 270 210 - 475 mg/dL    Comment: (NOTE) Fibrinogen results may be underestimated in patients receiving thrombolytic therapy.    D-Dimer, Quant 6.86 (H) 0.00 - 0.50 ug/mL-FEU    Comment: (NOTE) At the manufacturer cut-off value of 0.5 g/mL FEU, this assay has a negative predictive value of 95-100%.This assay is intended for use in conjunction with a clinical pretest probability (PTP) assessment model to exclude pulmonary embolism (PE) and deep venous thrombosis (DVT) in outpatients suspected of PE or DVT. Results should be correlated with clinical presentation.    Platelets 104 (L) 150 - 400 K/uL   Smear Review NO SCHISTOCYTES SEEN     Comment: Performed at [facility], [address], [place], [place] [address]  Glucose, capillary     Status: Abnormal   Collection Time: 09/05/23  8:03 AM  Result Value Ref Range   Glucose-Capillary 495 (H) 70 - 99 mg/dL    Comment: Glucose reference range applies only to samples taken after fasting for at least 8 hours.  Glucose, capillary     Status: Abnormal   Collection Time: 09/05/23  8:04 AM  Result Value Ref Range   Glucose-Capillary 245 (H) 70 - 99 mg/dL    Comment: Glucose reference range applies only to samples taken after fasting for at least 8 hours.   DG Chest Port 1 View Result Date: 09/04/2023 CLINICAL DATA:  Central line placement EXAM: PORTABLE CHEST 1 VIEW COMPARISON:  06/09/2023 FINDINGS: Right internal jugular central line tip in the SVC. No pneumothorax. Mild hyperinflation of the lungs. Heart and mediastinal contours are within normal limits. No focal opacities or effusions. No acute bony abnormality. Aortic atherosclerosis. IMPRESSION: Right central line tip in the SVC.  No pneumothorax. No  active disease. Electronically Signed   By: Charlett Nose M.D.   On: 09/04/2023 00:47   CT ABDOMEN PELVIS WO CONTRAST Result Date: 09/03/2023 CLINICAL DATA:  Kidney failure, acute kidney failure-evaluate for obstruction. CT with out contrast EXAM: CT ABDOMEN AND PELVIS WITHOUT CONTRAST TECHNIQUE: Multidetector CT imaging of the abdomen and pelvis was performed following the standard protocol without IV contrast. RADIATION DOSE REDUCTION: This exam was performed according to the departmental dose-optimization program which includes automated exposure control, adjustment of the mA and/or kV according to patient size and/or use of iterative reconstruction technique. COMPARISON:  06/09/2023 FINDINGS: Lower chest: No acute abnormality Hepatobiliary: No focal liver abnormality is seen. Status post cholecystectomy. No biliary dilatation. Pancreas: No focal abnormality or ductal dilatation. Spleen: No focal abnormality.  Normal size. Adrenals/Urinary Tract: Stable left adrenal nodule. Right adrenal gland unremarkable. Left kidney is atrophic, unchanged. Right nephrostomy catheter remains in place. No hydronephrosis. Previously seen right ureteral stent no longer visualized. Urinary bladder unremarkable. Stomach/Bowel: Right lower quadrant ostomy again noted. No evidence of bowel obstruction. Stomach unremarkable. No evidence of bowel obstruction or inflammatory process. Vascular/Lymphatic: Aortoiliac atherosclerosis. No evidence of aneurysm or adenopathy. Reproductive: Status post hysterectomy. No adnexal mass. Air within the vagina is unchanged since prior study. Other: No free fluid or free air. Stable postoperative changes in the pelvis. Musculoskeletal: No acute bony abnormality. IMPRESSION: Right nephrostomy tube in place. No hydronephrosis. Left kidney remains atrophic and unchanged since prior study. Right lower quadrant ostomy.  No evidence of bowel obstruction. Diffuse aortoiliac atherosclerosis. Stable  postoperative changes in the pelvis. Air within the vagina of unknown etiology. This is stable when compared to prior study. Electronically Signed   By: Charlett Nose M.D.   On: 09/03/2023 21:14    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   Thyroid disease    Vertigo     PSH:   Past Surgical History:  Procedure Laterality Date   ABDOMINAL ADHESION SURGERY  11/08/2020   Dr [name] - [facility]   ABDOMINAL HYSTERECTOMY  2003   [facility] - Dr [name] - for cervical cancer   BILE DUCT EXPLORATION  10/22/2003   CHOLECYSTECTOMY OPEN  10/22/2003   Dr Zachery Dakins - CBD repair w T tube   CYSTOSCOPY  2021   ILEOSTOMY  11/23/2018   ILEOSTOMY CLOSURE  04/2020   IR PATIENT EVAL TECH 0-60 MINS  06/20/2023   LOW ANTERIOR BOWEL RESECTION  11/23/2018   LAR/takedown colovaginal fistula/loop ileostomy  - [facility]    Allergies:  Allergies  Allergen Reactions   Barium-Containing Compounds     hives   Percocet [Oxycodone-Acetaminophen] Itching and Other (See Comments)    migraine     Medications:   Prior to Admission medications   Medication Sig Start Date End Date Taking? Authorizing Provider  acetaminophen (TYLENOL) 500 MG tablet Take 1,000 mg by mouth every 6 (six) hours as needed.   Yes [provider]  amoxicillin (AMOXIL) 500 MG capsule Take 500 mg by mouth 3 (three) times daily. 08/27/23  Yes [provider]  cetirizine (ZYRTEC) 10 MG tablet Take 10 mg by mouth daily as needed for allergies or rhinitis.   Yes [provider]  cholestyramine light (PREVALITE) 4 g packet Take 4 g by mouth 2 (two) times daily. 08/08/23 11/06/23 Yes [provider]  dronabinol (MARINOL) 2.5 MG capsule Take 2.5 mg by mouth 2 (two) times daily as needed (Appetite).   Yes [provider]  fluticasone (FLONASE) 50 MCG/ACT nasal  spray Place 1 spray into both nostrils daily as needed for allergies or rhinitis. 08/30/22  Yes [provider]  levothyroxine (SYNTHROID) 25 MCG tablet Take 1 tablet (25 mcg total) by mouth daily. 07/17/22  Yes Delynn Flavin M, DO  loperamide (IMODIUM) 2 MG capsule Take 2 mg by mouth 3 (three) times daily as needed for diarrhea or loose stools. 09/03/23  Yes [provider]  mirtazapine (REMERON) 7.5 MG tablet Take 7.5 mg by mouth at bedtime as needed (Anxiety/appetite).   Yes [provider]  Multiple Vitamins-Minerals (MULTIVITAMIN) tablet Take 1 tablet by mouth daily.   Yes [provider]  omeprazole (PRILOSEC) 20 MG capsule Take 20 mg by mouth daily as needed (Acid reflux). 05/21/23  Yes [provider]  ondansetron (ZOFRAN) 4 MG tablet Take 4 mg by mouth every 6 (six) hours as needed for nausea or vomiting.   Yes [provider]  promethazine (PHENERGAN) 12.5 MG tablet Take 12.5 mg by mouth every 6 (six) hours as needed for nausea or vomiting.   Yes [provider]  diphenoxylate-atropine (LOMOTIL) 2.5-0.025 MG tablet Take 2 tablets by mouth 3 (three) times daily as needed for diarrhea or loose stools. Patient not taking: Reported on 09/04/2023    [provider]  fluconazole (DIFLUCAN) 150 MG tablet Take 150 mg by mouth daily. For 7 days Patient not taking: Reported on 09/04/2023 08/11/23   [provider]  simvastatin (ZOCOR) 20 MG tablet Take 20 mg by mouth at bedtime. Patient not taking: Reported on 09/04/2023    [provider]    Discontinued Meds:   Medications Discontinued During This Encounter  Medication Reason   0.9 %  sodium chloride infusion    cefTRIAXone (ROCEPHIN) 1 g in sodium chloride 0.9 % 100 mL IVPB    norepinephrine (LEVOPHED) 4mg  in (0.016 mg/mL) premix infusion Entry Error   sodium chloride 1 g tablet Completed Course   sertraline (ZOLOFT) 25 MG tablet Discontinued by  provider   norepinephrine (LEVOPHED) 4mg  in (0.016 mg/mL) premix infusion    Amino Acid Infusion in D15W (CLINIMIX/DEXTROSE, 5/15, IV) Discontinued by provider   Fat Emulsion Plant Based (FAT EMULSION 20 %, TPN/TNA, FOR ANES DOCUMENTATION) 20 % EMUL Discontinued by provider   divalproex (DEPAKOTE) 125 MG DR tablet Discontinued by provider   Lactobacillus Rhamnosus, GG, (CULTURELLE HEALTH & WELLNESS)  CAPS Discontinued by provider   mupirocin ointment (BACTROBAN) 2 % Patient Preference   promethazine (PHENERGAN) 12.5 MG tablet Patient Preference   polyethylene glycol powder (GLYCOLAX/MIRALAX) 17 GM/SCOOP powder Patient Preference   rizatriptan (MAXALT-MLT) 10 MG disintegrating tablet Patient Preference   hydrOXYzine (ATARAX) 25 MG tablet Patient Preference   Oyster Shell Calcium 500 MG TABS Patient Preference   Multiple Vitamins-Minerals (ONCOVITE) TABS Patient Preference   sodium zirconium cyclosilicate (LOKELMA) packet 5 g    prismasol BGK 0/2.5 infusion    prismasol BGK 0/2.5 infusion    dextrose 5 % solution    insulin aspart (novoLOG) injection 0-6 Units    fluconazole (DIFLUCAN) IVPB 400 mg     Social History:  reports that she has quit smoking. Her smoking use included cigarettes. She has never used smokeless tobacco. She reports that she does not currently use alcohol. She reports that she does not use drugs.  Family History:   Family History  Problem Relation Age of Onset   Heart disease Father    Diabetes Father    Parkinson's disease Neg Hx     Blood pressure (!) 84/54, pulse 69, temperature 98 F (36.7 C), temperature source Oral, resp. rate (!) 22, height 5\' 2"  (1.575 m), weight 37.2 kg, SpO2 99%. Physical Exam: General exam: Status markedly improved.  Able to converse appropriately, cachectic, not in distress Respiratory system: Clear to auscultation. Respiratory effort normal. No wheezing or crackle Cardiovascular system: S1 & S2 heard, RRR.  No pedal  edema. Gastrointestinal system: Abdomen is nondistended, ileostomy bag with loose stool connected to drainage. Central nervous system: Tremors present Extremities: No edema. Skin: No rashes Access: RIJ temp (3/19)     Ethelene Hal, MD 09/05/2023, 8:31 AM

## 2023-09-05 NOTE — Procedures (Signed)
 Arterial Catheter Insertion Procedure Note  [name]  [id number]  [birth year]  Date:09/05/23  Time:12:25 PM    Provider Performing: Cheri Fowler    Procedure: Insertion of Arterial Line (25956) with US guidance (38756)   Indication(s) Blood pressure monitoring and/or need for frequent ABGs  Consent Risks of the procedure as well as the alternatives and risks of each were explained to the patient and/or caregiver.  Consent for the procedure was obtained and is signed in the bedside chart  Anesthesia None   Time Out Verified patient identification, verified procedure, site/side was marked, verified correct patient position, special equipment/implants available, medications/allergies/relevant history reviewed, required imaging and test results available.   Sterile Technique Maximal sterile technique including full sterile barrier drape, hand hygiene, sterile gown, sterile gloves, mask, hair covering, sterile ultrasound probe cover (if used).   Procedure Description Area of catheter insertion was cleaned with chlorhexidine and draped in sterile fashion. With real-time ultrasound guidance an arterial catheter was placed into the left  Axillary  artery.  Appropriate arterial tracings confirmed on monitor.     Complications/Tolerance None; patient tolerated the procedure well.   EBL Minimal   Specimen(s) None

## 2023-09-05 NOTE — Progress Notes (Addendum)
 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 Due to severity of acute kidney failure and acidosis, PCCM was consulted for ICU admission and further management along with nephrology consult.  Upon initial assessment, patient sitting up in ED stretcher with husband at beside. Patient with intermittent confusion, and disoriented to situation/time. Therefore husband able to provide and assist with HPI. According to husband, had initial appointment with MD Sandford with [facility] and when lab work came back, husband was advised to take patient to [facility] ED immediately. Per husband, over the last few weeks-patient has had issues with sleeping, more fatigued, and having poor oral intake. Husband endorses that ileostomy and right side nephrostomy tube have had moderate output. Patient states that she feels nauseous and sick on her stomach but no vomiting  currently. Husband denies patient having any chills/fevers, and or sick contacts. Patient tachycardic but normotensive, on room air, O2 sats 99 to 100%,  and malnourished.   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 Hgb drop correlated with drawing labs off the circuit, will attempt repeat arterial line placement again   Na overcorrected overnight, D5W resumed, lactic acid down-trending   Objective   Blood pressure (!) 84/54, pulse 69, temperature 98 F (36.7 C), temperature source Oral, resp. rate (!) 22, height 5\' 2"  (1.575 m), weight 37.2 kg, SpO2 99%.        Intake/Output Summary (Last 24 hours) at 09/05/2023 3664 Last data filed at 09/05/2023 0700 Gross per 24 hour  Intake 3146 ml  Output 1626 ml  Net 1520 ml   Filed Weights   09/03/23 2300 09/04/23 0419 09/05/23 0500  Weight: 37.8 kg 37.8 kg 37.2 kg   General:  ill-appearing F resting in bed in NAD HEENT: MM pink/dry  Neuro: alert, following commands, oriented to person only,  still disoriented to place and situation CV: s1s2 rrr, no m/r/g PULM:  clear bilaterally, no wheezing on RA  GI: soft, bsx4 active, non-tender, ileostomy and nephrostomy in place  Extremities: warm/dry, no edema  Skin: no rashes or lesions   Labs Na 126>129>124 Creat 2.95 K 3.6 Wbc 8.7 Hgb  7.0  Lines 3/19 R internal jugular HD catheter  Micro  3/20 Bcx2> NGTD   I/O +599 UOP 275cc yesterday  Resolved Hospital Problem list   N/a   Assessment & Plan:    Acute Renal Failure with Hyperkalemia and Hyponatremia   Anion Gap Metabolic Acidosis  Uremia  Ct abd/pelvis without obstruction and hydronephrosis  2024-obstructive uropathy AKI and BL hydronephrosis complicated by  Enterobacter and Candida UTI Patient had televisit on 08/27/23 for vaginal irritation and cloudy urine-started on amoxicillin and diflucan  P:  -electrolytes improving with CRRT, appreciate nephrology recs, if circuit clots today plan to rinse back and not resume, made 275cc urine yesterday  -Na overcorrected overnight to 129, D5W resumed @100cc /hr -goal 128 3pm today per nephrology -still requiring norepi -4K bath now that K has corrected  -Continue to trend renal function panel, and electrolytes  -follow blood cultures, urine culture not sent initially, UA with small leuks and rare bacteria, will go ahead and send urine culture today (from nephrostomy) given her persistent hypotension and lactic acidosis in the event she has an ESBL not covered with vanc/zosyn  -continue Zosyn and vanc in the setting of hypotension and lactic acidosis -lactic yesterday 6.6 on istat down-trended to 2.9 this AM after albumin -Trend WBC, fever curve  -Continue to monitor urine output via nephrostomy  -Continue strict I/O, continue adequate renal perfusion, avoid nephrotoxic agents   Acute Encephalopathy secondary to uremia/hypernatronemia  Likely secondary to sepsis and uremia  P: -Continue plan as above -Initiate Delirium precautions and fall precautions   SBO s/p with lysis of adhesions Recurrent SBO hx  recurrent SBO, ileum/cecum resection with ileostomy creation 2024 Recent admission for severe dehydration and high output  P: -Continue to monitor output-has had history of high output   Malnutrition   Hx of TPN use  BMI 19.76 P: -Tolerating diet, passed swallow screen -RD to evaluate today  Hyperglycemia  A1c 5.7% -glucose above goal, change SSI to moderate and add Semglee 5 units qhs  Acute Anemia Question results given Hgb drop correlated with having to draw labs from the circuit  -repeat CBC once arterial line is placed -DIC panel without schistocytes    Best Practice (right click and "Reselect all SmartList Selections" daily)   Diet/type: Regular consistency (see orders) DVT prophylaxis prophylactic heparin  Pressure ulcer(s): N/A GI prophylaxis: PPI Lines: N/A Foley:  N/A Code Status:  full code Last date of multidisciplinary goals of care discussion: Family updated at the bedside 3/21  Labs   CBC: Recent Labs  Lab 09/03/23 2220 09/04/23 0001 09/04/23 2025 09/05/23 0307 09/05/23 0454  WBC 11.9* 11.9* 10.0 6.6  --   NEUTROABS 10.4*  --   --   --   --   HGB 15.2* 12.5 8.8* 7.2* 7.4*  HCT RESULTS UNAVAILABLE DUE TO INTERFERING SUBSTANCE 33.3* 23.9* 19.4* 20.2*  MCV RESULTS UNAVAILABLE DUE TO INTERFERING SUBSTANCE 88.1 90.9 91.5  --   PLT 334 339 178 94* 104*    Basic Metabolic Panel: Recent Labs  Lab 09/04/23 0001 09/04/23 0618 09/04/23 0620 09/04/23 0800 09/04/23 7253 09/04/23 1128 09/04/23 1332 09/04/23 1626 09/04/23 2000 09/04/23 2356 09/05/23 6644  NA 121*  --  122*   < > 125* 123* 121* 123*  120* 126* 129* 129*  K 5.5*  --  3.9  --  4.0 3.7 3.7 3.8  --   --  3.5  CL 73*  --  81*  --  81* 84* 82* 82*  --   --  89*  CO2 18*  --  18*  --  21* 20* 23 22  --   --  25  GLUCOSE 83  --  137*  --  144* 198* 250* 307*  --   --  136*  BUN 171*  --  135*  --  116* 100* 96* 75*  --   --  46*  CREATININE 10.11*  --  7.69*  --  6.97* 6.12* 5.60* 5.05*  --   --  3.32*  CALCIUM 8.5*  --  8.0*  --  8.1* 7.8* 7.7* 7.8*  --   --  8.3*  MG 1.4* 1.5*  --   --   --   --   --   --   --   --  2.3  PHOS >30.0*  --  9.3*  --   --   --   --  5.3*  --   --  4.2   <  > = values in this interval not displayed.   GFR: Estimated Creatinine Clearance: 9.7 mL/min (A) (by C-G formula based on SCr of 3.32 mg/dL (H)). Recent Labs  Lab 09/03/23 2031 09/03/23 2220 09/04/23 0001 09/04/23 1818 09/04/23 2025 09/04/23 2100 09/05/23 0307  PROCALCITON  --   --  0.82  --   --   --   --   WBC  --  11.9* 11.9*  --  10.0  --  6.6  LATICACIDVEN 3.6*  --  1.8 6.6*  --  4.0*  --     Liver Function Tests: Recent Labs  Lab 09/03/23 1703 09/04/23 0620 09/04/23 1626 09/05/23 0307  AST 30  --   --   --   ALT 54*  --   --   --   ALKPHOS 195*  --   --   --   BILITOT 0.8  --   --   --   PROT 8.0  --   --   --   ALBUMIN 4.3 3.4* 2.9* 3.3*   No results for input(s): "LIPASE", "AMYLASE" in the last 168 hours. No results for input(s): "AMMONIA" in the last 168 hours.  ABG    Component Value Date/Time   HCO3 12.5 (L) 04/28/2023 2354   ACIDBASEDEF 12.4 (H) 04/28/2023 2354   O2SAT 29.2 04/28/2023 2354     Coagulation Profile: Recent Labs  Lab 09/03/23 2220 09/05/23 0454  INR 1.1 1.3*    Cardiac Enzymes: No results for input(s): "CKTOTAL", "CKMB", "CKMBINDEX", "TROPONINI" in the last 168 hours.  HbA1C: HB A1C (BAYER DCA - WAIVED)  Date/Time Value Ref Range Status  12/11/2020 10:13 AM 4.7 <7.0 % Final    Comment:                                          Diabetic Adult            <7.0  Healthy Adult        4.3 - 5.7                                                           (DCCT/NGSP) American Diabetes Association's Summary of Glycemic Recommendations for Adults with Diabetes: Hemoglobin A1c <7.0%. More stringent glycemic goals (A1c <6.0%) may further reduce complications at the cost of increased risk of hypoglycemia.    Hgb A1c MFr Bld  Date/Time Value Ref Range Status  09/03/2023 10:20 PM 5.7 (H) 4.8 - 5.6 % Final    Comment:    (NOTE) Pre diabetes:          5.7%-6.4%  Diabetes:               >6.4%  Glycemic control for   <7.0% adults with diabetes     CBG: Recent Labs  Lab 09/04/23 2004 09/05/23 0000 09/05/23 0312 09/05/23 0803 09/05/23 0804  GLUCAP 156* 110* 143* 495* 245*    Review of Systems:   See HPI   Past Medical History:  She,  has a past medical history of Atrophic kidney, Cancer (HCC), Cervical cancer (HCC), Colovaginal fistula, Pyelonephritis, Renal atrophy, left (11/19/2022), Thyroid disease, and Vertigo.   Surgical History:   Past Surgical History:  Procedure Laterality Date   ABDOMINAL ADHESION SURGERY  11/08/2020   Dr [name] - [facility]   ABDOMINAL HYSTERECTOMY  2003   [facility] - Dr [name] - for cervical cancer   BILE DUCT EXPLORATION  10/22/2003   CHOLECYSTECTOMY OPEN  10/22/2003   Dr Zachery Dakins - CBD repair w T tube   CYSTOSCOPY  2021   ILEOSTOMY  11/23/2018   ILEOSTOMY CLOSURE  04/2020   IR PATIENT EVAL TECH 0-60 MINS  06/20/2023   LOW ANTERIOR BOWEL RESECTION  11/23/2018   LAR/takedown colovaginal fistula/loop ileostomy  - [facility]     Social History:   reports that she has quit smoking. Her smoking use included cigarettes. She has never used smokeless tobacco. She reports that she does not currently use alcohol. She reports that she does not use drugs.   Family History:  Her family history includes Diabetes in her father; Heart disease in her father. There is no history of Parkinson's disease.   Allergies Allergies  Allergen Reactions   Barium-Containing Compounds     hives   Percocet [Oxycodone-Acetaminophen] Itching and Other (See Comments)    migraine      Home Medications  Prior to Admission medications   Medication Sig Start Date End Date Taking? Authorizing Provider  acetaminophen (TYLENOL) 500 MG tablet Take 1,000 mg by mouth every 6 (six) hours as needed.    [provider]  Amino Acid Infusion in D15W (CLINIMIX/DEXTROSE, 5/15, IV) Inject 42 mLs into the vein See admin instructions. 42 ml per hour, intravenously every 24  hours Patient not taking: Reported on 06/25/2023    [provider]  cetirizine (ZYRTEC) 10 MG tablet Take 10 mg by mouth daily. PRN    [provider]  divalproex (DEPAKOTE) 125 MG DR tablet Take 125 mg by mouth at bedtime.    [provider]  dronabinol (MARINOL) 2.5 MG capsule Take 2.5 mg by mouth 2 (two) times daily. Patient not taking: Reported on 06/25/2023    [provider]  Fat Emulsion  Plant Based (FAT EMULSION 20 %, TPN/TNA, FOR ANES DOCUMENTATION) 20 % EMUL Inject 21 mLs into the vein continuous. 21 ml per hour every 24 hour (250 ml over 12 hours), once lipid bag is completed, disconnect lipid line, flush picc line with 5-10 ml saline flush. Patient not taking: Reported on 06/25/2023    [provider]  fluticasone (FLONASE) 50 MCG/ACT nasal spray Place 1 spray into both nostrils daily. 08/30/22   [provider]  hydrOXYzine (ATARAX) 25 MG tablet Take 25 mg by mouth every 8 (eight) hours as needed for anxiety.    [provider]  Lactobacillus Rhamnosus, GG, (CULTURELLE HEALTH & WELLNESS) CAPS Take 1 capsule by mouth daily. 05/21/23   [provider]  levothyroxine (SYNTHROID) 25 MCG tablet Take 1 tablet (25 mcg total) by mouth daily. 07/17/22   [name], DO  mirtazapine (REMERON) 7.5 MG tablet Take 7.5 mg by mouth at bedtime.    [provider]  Multiple Vitamins-Minerals (MULTIVITAMIN) tablet Take 1 tablet by mouth daily.    [provider]  Multiple Vitamins-Minerals (ONCOVITE) TABS Take 1 tablet by mouth daily. 05/21/23   [provider]  mupirocin ointment (BACTROBAN) 2 % 1 Application 2 (two) times daily. To red area around insertion site for 7 days    [provider]  omeprazole (PRILOSEC) 20 MG capsule Take 20 mg by mouth in the morning. 05/21/23   [provider]  ondansetron (ZOFRAN) 4 MG tablet Take 4 mg by mouth every 6 (six) hours as needed for nausea or  vomiting. Patient not taking: Reported on 06/25/2023    [provider]  Ethelda Chick Calcium 500 MG TABS Take 1,250 mg by mouth daily. 05/21/23   [provider]  polyethylene glycol powder (GLYCOLAX/MIRALAX) 17 GM/SCOOP powder Take 1 Container by mouth once. Give 1 scoop by mouth every 24 hours as needed for constipation    [provider]  promethazine (PHENERGAN) 12.5 MG tablet Take 12.5 mg by mouth every 6 (six) hours as needed for nausea or vomiting. Patient not taking: Reported on 06/25/2023    [provider]  sertraline (ZOLOFT) 25 MG tablet Take 25 mg by mouth daily. Patient not taking: Reported on 06/25/2023    [provider]  sodium chloride 1 g tablet Take 1 g by mouth daily. For 3 days Patient not taking: Reported on 06/25/2023    [provider]     Critical care time: 35 mins     CRITICAL CARE Performed by: Darcella Gasman Jnya Brossard   Total critical care time: 35 minutes  Critical care time was exclusive of separately billable procedures and treating other patients.  Critical care was necessary to treat or prevent imminent or life-threatening deterioration.  Critical care was time spent personally by me on the following activities: development of treatment plan with patient and/or surrogate as well as nursing, discussions with consultants, evaluation of patient's response to treatment, examination of patient, obtaining history from patient or surrogate, ordering and performing treatments and interventions, ordering and review of laboratory studies, ordering and review of radiographic studies, pulse oximetry and re-evaluation of patient's condition.    Darcella Gasman Mekel Haverstock, PA-C [facility] See Amion for pager If no response to pager , please call [phone] until 7pm After 7:00 pm call Elink  [phone]

## 2023-09-05 NOTE — Progress Notes (Addendum)
 eLink Physician-Brief Progress Note Patient Name: [name] DOB: 1956/02/16 MRN: [id number]   Date of Service  09/05/2023  HPI/Events of Note  68 year old female with a history of cervical cancer complicated by rectovaginal fistula with extensive intra-abdominal surgical history, bilateral hydronephrosis/obstructive uropathy status post ureteral stents and history of recurrent AKI who presents for evaluation of AKI on CKD and hyperkalemia.   Na, goal 123-125, currently 129  eICU Interventions  Add D5w   0426 - + substantial decline in hemoglobin.  Will submitted type and screen and monitor every 6 hours.  No obvious bleeding, but if this continues, may benefit from angiography  Intervention Category Intermediate Interventions: Electrolyte abnormality - evaluation and management  Tiffani Kadow 09/05/2023, 2:05 AM

## 2023-09-05 NOTE — Evaluation (Signed)
 Occupational Therapy Evaluation Patient Details Name: [name] MRN: [id number] DOB: 1956-01-10 Today's Date: 09/05/2023   History of Present Illness   68 y.o. female presents to [facility] 09/03/23 from [facility] due to abnormal lab work. Pt admitted with acute kidney failure, acidosis, and acute encephalopathy. Suspected uretal obstruction vs. Severe dehydration vs. Urosepsis etiology. 3/20 began CRRT. PMHx: atrophic kidney, recurrent AKI, cervical cancer, colovaginal fistula, pyelonephritis, renal atrophy, vertigo     Clinical Impressions Pt reports she is typically Independent with ADLs and functional mobility. However, OT uncertain if pt is currently a reliable reporter due to decreased cognition. Per chart review, pt with need for assist for ADLs and using a RW for mobility in December 2024. Pt now presents with decreased cognition, decreased B UE strength (grossly 4/5), decreased balance, decreased activity tolerance, and decreased safety with functional tasks. Pt currently demonstrates ability to complete UB ADLs with Set up to Mod assist +2 for equipment/safety, LB ADLs with Max to Total assist +2 for equipment/safety, and bed mobility during/in preparation for functional tasks with Min assist +2 for equipment/safety. Pt occasionally impulsive with movement and requiring cues for safety, sequencing, and attention to task throughout session. Pt participated well in session and is motivated to return to PLOF. Pt HR in the 60s to 70s. O2 sat >/99% on RA. BP soft but stable with pt reporting no dizziness or lightheadedness. RN present to monitor CRRT and assist with lines as needed. Pt will benefit from acute skilled OT services to address deficits outlined below, decrease caregiver burden, and increase safety and independence with functional tasks. Post acute discharge, pt will benefit from intensive inpatient skilled rehab services < 3 hours per day to maximize rehab potential.      If plan  is discharge home, recommend the following:   Two people to help with walking and/or transfers;Two people to help with bathing/dressing/bathroom;Assistance with cooking/housework;Direct supervision/assist for medications management;Direct supervision/assist for financial management;Assist for transportation;Help with stairs or ramp for entrance;Supervision due to cognitive status     Functional Status Assessment   Patient has had a recent decline in their functional status and demonstrates the ability to make significant improvements in function in a reasonable and predictable amount of time.     Equipment Recommendations   BSC/3in1     Recommendations for Other Services         Precautions/Restrictions   Precautions Precautions: Fall Recall of Precautions/Restrictions: Impaired Precaution/Restrictions Comments: R nephrostomy, R UQ ileostomy, IJ CRRT Restrictions Weight Bearing Restrictions Per Provider Order: No     Mobility Bed Mobility Overal bed mobility: Needs Assistance Bed Mobility: Supine to Sit, Sit to Supine     Supine to sit: Min assist, HOB elevated, +2 for safety/equipment Sit to supine: Min assist, HOB elevated, +2 for safety/equipment   General bed mobility comments: with mildly increased time; +2 assist for CRRT line management    Transfers                   General transfer comment: deferred this session due to pt  reporting increased discomfort with CCRT lines in sitting and requesting to return to supine with HOB elevated      Balance Overall balance assessment: Needs assistance, Mild deficits observed, not formally tested Sitting-balance support: No upper extremity supported, Feet supported Sitting balance-Leahy Scale: Fair Sitting balance - Comments: Pt able to maintain static balance at EOB without support and no balance deficits noted; pt with mild balance deficits noted in  dynamic sitting EOB but able to self correct with verbal  cues.                                   ADL either performed or assessed with clinical judgement   ADL Overall ADL's : Needs assistance/impaired Eating/Feeding: Set up;Sitting   Grooming: Contact guard assist;Cueing for sequencing;Cueing for compensatory techniques;Sitting;Cueing for safety   Upper Body Bathing: Minimal assistance;Cueing for safety;Cueing for sequencing;Cueing for compensatory techniques;Sitting (+2 for safety/equipment management)   Lower Body Bathing: Maximal assistance;Cueing for safety;Cueing for sequencing;Cueing for compensatory techniques;Bed level;+2 for safety/equipment (+2 assist due to line management/safety)   Upper Body Dressing : Minimal assistance;Moderate assistance;Cueing for safety;Cueing for sequencing;Cueing for compensatory techniques;Sitting (+2 assist due to line management/safety)   Lower Body Dressing: Maximal assistance;Cueing for safety;Cueing for sequencing;Cueing for compensatory techniques;Bed level;+2 for safety/equipment (+2 assist due to line management/safety)       Toileting- Clothing Manipulation and Hygiene: Total assistance;Bed level (ileostomy, nephrostomy)       Functional mobility during ADLs:  (deferred this session) General ADL Comments: Pt participated well but with occasional impulsiveness of movement and requiring cues for sequening, safety, and attention to tasks throughout session.     Vision Baseline Vision/History: 0 No visual deficits (per pt report; uncertain if pt is a reliable reporter) Ability to See in Adequate Light: 0 Adequate Patient Visual Report: No change from baseline Vision Assessment?: No apparent visual deficits Additional Comments: Vision appears [facility] for tasks assessed. Vision not formally screened or assessed     Perception         Praxis         Pertinent Vitals/Pain Pain Assessment Pain Assessment: Faces Faces Pain Scale: Hurts a little bit Pain Location: At site of  Right jugular dialysis catheter, both at rest and with movement Pain Descriptors / Indicators: Discomfort, Other (Comment) (Pt reports site is "uncomfortable" and descirbes "this paper feels funny" refering to banadge and tape covering the area/securing CRRT lines. Pt repeatedly asking when the tape and lines will be removed- RN notified.) Pain Intervention(s): Monitored during session, Other (comment), Repositioned, Limited activity within patient's tolerance (Notified RN of pt report)     Extremity/Trunk Assessment Upper Extremity Assessment Upper Extremity Assessment: Right hand dominant;Overall [facility] for tasks assessed (Gross B UE strength 4/5; B UE coordination, ROM, and sensation WFL; R UE testing limited due to CRRT)   Lower Extremity Assessment Lower Extremity Assessment: Defer to PT evaluation   Cervical / Trunk Assessment Cervical / Trunk Assessment: Kyphotic   Communication Communication Communication: Other (comment) (Communication largely WFL but with pt requiring increased time for processing. Pt also benefiting from use of short sentences and simple, 1-step instructions.) Factors Affecting Communication: Other (comment) (current cognitive status)   Cognition Arousal: Alert Behavior During Therapy: WFL for tasks assessed/performed, Impulsive (Largerly WFL with occaisonal impulsiveness with movement) Cognition: Cognition impaired, No family/caregiver present to determine baseline   Orientation impairments: Situation, Time (Oriented to self and being at [facility]; disoriented to situation and current date/time) Awareness: Intellectual awareness impaired, Online awareness impaired (Pt reports repeatedly she doesn't know why she is "hooked up to so many things" even after education regarding situation/medical condition and reporting she "feels fine.") Memory impairment (select all impairments): Short-term memory, Working [employer] fast streamer, Conservation officer, historic buildings Attention impairment (select  first level of impairment): Selective attention Executive functioning impairment (select all impairments): Organization, Sequencing, Reasoning, Problem solving OT -  Cognition Comments: Pt alert and pleasant throughout session with cognitive deficits noted above.                 Following commands: Impaired Following commands impaired: Only follows one step commands consistently, Follows one step commands with increased time, Follows multi-step commands inconsistently, Follows multi-step commands with increased time     Cueing  General Comments   Cueing Techniques: Verbal cues;Tactile cues;Visual cues  HR in the 60s to 70s. O2 sat >/99% on RA. BP soft but stable with pt reporting no dizziness or lightheadedness. RN present to monitor CRRT and assist with lines as needed. Dietician present at end of session.   Exercises     Shoulder Instructions      Home Living Family/patient expects to be discharged to:: Private residence Living Arrangements: Spouse/significant other Available Help at Discharge: Family;Available 24 hours/day Type of Home: House Home Access: Stairs to enter [employer] of Steps: 4 Entrance Stairs-Rails:  (Pt reports there is a hand rail but uncertain if one or two) Home Layout: Two level Alternate Level Stairs-Number of Steps: flight Alternate Level Stairs-Rails:  (Pt reports there is an hand rail but uncertain if there is one or two) Bathroom Shower/Tub: Producer, television/film/video: Standard     Home Equipment: Agricultural consultant (2 wheels)   Additional Comments: Pt with noted cognitive deficts (see Cognition) this session with OT uncertain if pt is a reliable reporter and with pt able to offer only minimal information about home set up but what she reports is in line with information form prior OT notes in December 2024. No family/caregiver available to confirm at this time.      Prior Functioning/Environment Prior Level of Function :  Independent/Modified Independent             Mobility Comments: Pt reports she is Independent without an AD and reports no recent falls. Per cahrt review, pt was using RW in December 2024. No family/caregiver available this day to confirm. Dietician present at end of session today reports she spoke with pt's husband the previous day and he reported pt was not having any difficulty navigating stairs or getting in and out of husband's truck just prior to this admission. ADLs Comments: Pt reports she is Independent with ADLs. Pt reports assistance from her husband for transportation. Per chart review, pt was requiring some assist for ADLs in December 2024. No family/caregiver available today to confirm.    OT Problem List: Decreased activity tolerance;Impaired balance (sitting and/or standing);Decreased cognition;Decreased safety awareness;Decreased knowledge of use of DME or AE   OT Treatment/Interventions: Self-care/ADL training;Therapeutic exercise;DME and/or AE instruction;Therapeutic activities;Cognitive remediation/compensation;Patient/family education;Balance training      OT Goals(Current goals can be found in the care plan section)   Acute Rehab OT Goals Patient Stated Goal: to have CRRT lines removed to return home with husband OT Goal Formulation: With patient Time For Goal Achievement: 09/19/23 Potential to Achieve Goals: Good ADL Goals Pt Will Perform Grooming: with contact guard assist;standing Pt Will Perform Lower Body Bathing: with min assist;sit to/from stand;sitting/lateral leans Pt Will Perform Lower Body Dressing: with min assist;sitting/lateral leans;sit to/from stand Additional ADL Goal #1: Patient will demonstrate ability to follow 2-step commands appropriately with Mod I in 4/4 opportunities to increase safety and independence during functional tasks. Additional ADL Goal #2: Patient will demonstrate ability to sequence 3-step functional and therapeutic tasks  appropriately with Mod I in 3/4 opportunities to increase safety and independence with functional tasks.  OT Frequency:  Min 2X/week    Co-evaluation              AM-PAC OT "6 Clicks" Daily Activity     Outcome Measure Help from another person eating meals?: A Little Help from another person taking care of personal grooming?: A Little Help from another person toileting, which includes using toliet, bedpan, or urinal?: Total (ileostomy and nephrostomy) Help from another person bathing (including washing, rinsing, drying)?: A Lot Help from another person to put on and taking off regular upper body clothing?: A Lot Help from another person to put on and taking off regular lower body clothing?: A Lot 6 Click Score: 13   End of Session Equipment Utilized During Treatment: Other (comment) (CRRT) Nurse Communication: Mobility status;Other (comment) (Pt with decreased cognition, but participating well. RN monitoring CRRT during session.)  Activity Tolerance: Patient tolerated treatment well Patient left: in bed;with call bell/phone within reach;Other (comment) (Dietician entering room, RN preparing to re-enter room)  OT Visit Diagnosis: Other abnormalities of gait and mobility (R26.89);Other symptoms and signs involving cognitive function                Time: 8295-6213 OT Time Calculation (min): 17 min Charges:  OT General Charges $OT Visit: 1 Visit OT Evaluation $OT Eval Low Complexity: 1 Low  Margaretha Mahan "Kyle" M., OTR/L, MA Acute Rehab [phone]   Lendon Colonel 09/05/2023, 10:54 AM

## 2023-09-06 DIAGNOSIS — E43 Unspecified severe protein-calorie malnutrition: Secondary | ICD-10-CM | POA: Diagnosis not present

## 2023-09-06 DIAGNOSIS — E875 Hyperkalemia: Secondary | ICD-10-CM | POA: Diagnosis not present

## 2023-09-06 DIAGNOSIS — E871 Hypo-osmolality and hyponatremia: Secondary | ICD-10-CM | POA: Diagnosis not present

## 2023-09-06 DIAGNOSIS — N179 Acute kidney failure, unspecified: Secondary | ICD-10-CM | POA: Diagnosis not present

## 2023-09-06 LAB — RENAL FUNCTION PANEL
Albumin: 3 g/dL — ABNORMAL LOW (ref 3.5–5.0)
Albumin: 2.7 g/dL — ABNORMAL LOW (ref 3.5–5.0)
Anion gap: 13 (ref 5–15)
Anion gap: 15 (ref 5–15)
BUN: 25 mg/dL — ABNORMAL HIGH (ref 8–23)
BUN: 30 mg/dL — ABNORMAL HIGH (ref 8–23)
CO2: 28 mmol/L (ref 22–32)
CO2: 26 mmol/L (ref 22–32)
Calcium: 8.9 mg/dL (ref 8.9–10.3)
Calcium: 8.6 mg/dL — ABNORMAL LOW (ref 8.9–10.3)
Chloride: 88 mmol/L — ABNORMAL LOW (ref 98–111)
Chloride: 87 mmol/L — ABNORMAL LOW (ref 98–111)
Creatinine, Ser: 2.88 mg/dL — ABNORMAL HIGH (ref 0.44–1.00)
Creatinine, Ser: 3.11 mg/dL — ABNORMAL HIGH (ref 0.44–1.00)
GFR, Estimated: 17 mL/min — ABNORMAL LOW (ref >60)
GFR, Estimated: 16 mL/min — ABNORMAL LOW (ref >60)
Glucose, Bld: 61 mg/dL — ABNORMAL LOW (ref 70–99)
Glucose, Bld: 75 mg/dL (ref 70–99)
Phosphorus: 2.8 mg/dL (ref 2.5–4.6)
Phosphorus: 2 mg/dL — ABNORMAL LOW (ref 2.5–4.6)
Potassium: 3.2 mmol/L — ABNORMAL LOW (ref 3.5–5.1)
Potassium: 3.6 mmol/L (ref 3.5–5.1)
Sodium: 129 mmol/L — ABNORMAL LOW (ref 135–145)
Sodium: 128 mmol/L — ABNORMAL LOW (ref 135–145)

## 2023-09-06 LAB — GLUCOSE, CAPILLARY
Glucose-Capillary: 155 mg/dL — ABNORMAL HIGH (ref 70–99)
Glucose-Capillary: 156 mg/dL — ABNORMAL HIGH (ref 70–99)
Glucose-Capillary: 261 mg/dL — ABNORMAL HIGH (ref 70–99)
Glucose-Capillary: 320 mg/dL — ABNORMAL HIGH (ref 70–99)
Glucose-Capillary: 330 mg/dL — ABNORMAL HIGH (ref 70–99)
Glucose-Capillary: 551 mg/dL (ref 70–99)
Glucose-Capillary: 57 mg/dL — ABNORMAL LOW (ref 70–99)
Glucose-Capillary: 80 mg/dL (ref 70–99)
Glucose-Capillary: 84 mg/dL (ref 70–99)
Glucose-Capillary: 84 mg/dL (ref 70–99)
Glucose-Capillary: 96 mg/dL (ref 70–99)
Glucose-Capillary: 99 mg/dL (ref 70–99)

## 2023-09-06 LAB — GASTROINTESTINAL PANEL BY PCR, STOOL (REPLACES STOOL CULTURE)

## 2023-09-06 LAB — CBC
HCT: 21.9 % — ABNORMAL LOW (ref 36.0–46.0)
Hemoglobin: 8 g/dL — ABNORMAL LOW (ref 12.0–15.0)
MCH: 34.2 pg — ABNORMAL HIGH (ref 26.0–34.0)
MCHC: 36.5 g/dL — ABNORMAL HIGH (ref 30.0–36.0)
MCV: 93.6 fL (ref 80.0–100.0)
Platelets: 113 10*3/uL — ABNORMAL LOW (ref 150–400)
RBC: 2.34 MIL/uL — ABNORMAL LOW (ref 3.87–5.11)
RDW: 17.6 % — ABNORMAL HIGH (ref 11.5–15.5)
WBC: 11.6 10*3/uL — ABNORMAL HIGH (ref 4.0–10.5)
nRBC: 0 % (ref 0.0–0.2)

## 2023-09-06 LAB — BASIC METABOLIC PANEL
Anion gap: 13 (ref 5–15)
BUN: 28 mg/dL — ABNORMAL HIGH (ref 8–23)
CO2: 27 mmol/L (ref 22–32)
Calcium: 8.9 mg/dL (ref 8.9–10.3)
Chloride: 89 mmol/L — ABNORMAL LOW (ref 98–111)
Creatinine, Ser: 2.91 mg/dL — ABNORMAL HIGH (ref 0.44–1.00)
GFR, Estimated: 17 mL/min — ABNORMAL LOW (ref >60)
Glucose, Bld: 62 mg/dL — ABNORMAL LOW (ref 70–99)
Potassium: 3.3 mmol/L — ABNORMAL LOW (ref 3.5–5.1)
Sodium: 129 mmol/L — ABNORMAL LOW (ref 135–145)

## 2023-09-06 LAB — URINE CULTURE: Culture: 50000 — AB

## 2023-09-06 LAB — MAGNESIUM: Magnesium: 2 mg/dL (ref 1.7–2.4)

## 2023-09-06 MED ORDER — POTASSIUM CHLORIDE 10 MEQ/100ML IV SOLN
10.0000 meq | INTRAVENOUS | Status: AC
  Administered 2023-09-06 (×4): 10 meq via INTRAVENOUS
  Filled 2023-09-06 (×2): qty 100

## 2023-09-06 MED ORDER — PROCHLORPERAZINE EDISYLATE 10 MG/2ML IJ SOLN
10.0000 mg | Freq: Four times a day (QID) | INTRAMUSCULAR | Status: AC | PRN
  Administered 2023-09-06 – 2023-09-07 (×3): 10 mg via INTRAVENOUS
  Filled 2023-09-06 (×4): qty 2

## 2023-09-06 MED ORDER — INSULIN GLARGINE 100 UNIT/ML ~~LOC~~ SOLN: 10.0000 [IU] | Freq: Two times a day (BID) | SUBCUTANEOUS | Status: DC

## 2023-09-06 MED ORDER — MORPHINE SULFATE (PF) 2 MG/ML IV SOLN
1.0000 mg | Freq: Once | INTRAVENOUS | Status: AC
  Administered 2023-09-06: 1 mg via INTRAVENOUS
  Filled 2023-09-06: qty 1

## 2023-09-06 MED ORDER — INSULIN GLARGINE 100 UNIT/ML ~~LOC~~ SOLN
10.0000 [IU] | Freq: Two times a day (BID) | SUBCUTANEOUS | Status: DC
  Administered 2023-09-06 – 2023-09-07 (×3): 10 [IU] via SUBCUTANEOUS
  Filled 2023-09-06 (×5): qty 0.1

## 2023-09-06 MED ORDER — LACTATED RINGERS IV SOLN: INTRAVENOUS | Status: AC

## 2023-09-06 MED ORDER — CLOTRIMAZOLE 1 % VA CREA
1.0000 | TOPICAL_CREAM | Freq: Every day | VAGINAL | Status: AC
  Administered 2023-09-06 – 2023-09-12 (×7): 1 via VAGINAL
  Filled 2023-09-06 (×3): qty 45

## 2023-09-06 MED ORDER — PIPERACILLIN-TAZOBACTAM IN DEX 2-0.25 GM/50ML IV SOLN
2.2500 g | Freq: Three times a day (TID) | INTRAVENOUS | Status: AC
  Administered 2023-09-06 – 2023-09-16 (×32): 2.25 g via INTRAVENOUS
  Filled 2023-09-06 (×33): qty 50

## 2023-09-06 MED ORDER — LOPERAMIDE HCL 2 MG PO CAPS
2.0000 mg | ORAL_CAPSULE | Freq: Four times a day (QID) | ORAL | Status: DC
  Administered 2023-09-06 – 2023-09-13 (×29): 2 mg via ORAL
  Filled 2023-09-06 (×29): qty 1

## 2023-09-06 NOTE — Progress Notes (Signed)
 [name] is an 68 y.o. 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  06/2023 right-sided PCN  + ureteral stent. The patient went to [facility] for initial consult of CKD when she was found to have critical lab results and was advised to go to ER. Sodium level 117, potassium 6.6, CO2 10, BUN 169, creatinine level 10.15, anion gap 34, hemoglobin 9.  UA turbid with a chronic proteinuria  Assessment/Plan: # Acute kidney injury (BL cr 08-1 06/14/2023) concerning for obstructive uropathy and may have ischemic ATN with reduced intravascular volume.  Patient with history of bilateral hydronephrosis and has right nephrostomy tube.  Now with severe metabolic derangement with hyperkalemia, severe acidosis and uremic. CT AP atrophic LK, no hydro on rt (essentially operating with only the RK)   Started on  CRRT 3/20-3/21 initially low blood flow rate and low dialysate flow rate because of very high BUN and hyponatremia. D5W off 3/21 ~2pm.   Large output through ostomy (3L /24hrs),agree with isotonic fluids.No absolute indication to restart CRRT and would continue to hold. Will follow closely with you.   # Severe hyponatremia, subacute to chronic: We will aim for sodium level around 130-132 Sat 10AM Reducing dialysis flow rates to minimize overcorrection now off D5W and CRRT.  Na level now stable at 129 and mental status much improved.   # Severe hyperkalemia resolved   # Anion gap metabolic acidosis with AKI resolved   # History of obstructive uropathy, bilateral hydronephrosis but currently only operating with  the RK no hydro RK on CT scan of abdomen pelvis  Subjective: Mental status improved and conversing appropriately; complaining of pain in right neck but knows not to pull the catheter.  Large volume stool output through ostomy.   Chemistry and CBC: Creatinine, Ser  Date/Time Value Ref Range Status  09/06/2023 03:56 AM 2.91 (H) 0.44 - 1.00 mg/dL Final  81/19/1478 29:56 AM 2.88 (H) 0.44 - 1.00 mg/dL Final  21/30/8657 84:69 PM 2.63 (H) 0.44 - 1.00 mg/dL Final    Comment:    DELTA CHECK NOTED  09/05/2023 05:00 PM 2.38 (H) 0.44 - 1.00 mg/dL Final  62/95/2841 32:44 PM 2.55 (H) 0.44 - 1.00 mg/dL Final    Comment:    DELTA CHECK NOTED  09/05/2023 08:22 AM 2.95 (H) 0.44 - 1.00 mg/dL Final    Comment:    DELTA CHECK NOTED  09/05/2023 03:07 AM 3.32 (H) 0.44 - 1.00 mg/dL Final    Comment:    DELTA CHECK NOTED  09/04/2023 04:26 PM 5.05 (H) 0.44 - 1.00 mg/dL Final    Comment:    DELTA CHECK NOTED  09/04/2023 01:32 PM 5.60 (H) 0.44 - 1.00 mg/dL Final  06/19/7251 66:44 AM 6.12 (H) 0.44 - 1.00 mg/dL Final  03/47/4259 56:38 AM 6.97 (H) 0.44 - 1.00 mg/dL Final  75/64/3329 51:88 AM 7.69 (H) 0.44 - 1.00 mg/dL Final  41/66/0630 16:01 AM 10.11 (H) 0.44 - 1.00 mg/dL Final  09/32/3557 32:20 PM 10.34 (H) 0.44 - 1.00 mg/dL Final  25/42/7062 37:62 PM 10.15 (H) 0.44 - 1.00 mg/dL Final  83/15/1761 60:73 AM 1.22 (H) 0.44 - 1.00 mg/dL Final  71/11/2692 85:46 AM 1.20 (  H) 0.44 - 1.00 mg/dL Final  16/03/9603 54:09 AM 0.93 0.44 - 1.00 mg/dL Final  81/19/1478 29:56 AM 0.95 0.44 - 1.00 mg/dL Final  21/30/8657 84:69 AM 0.92 0.44 - 1.00 mg/dL Final  62/95/2841 32:44 AM 1.23 (H) 0.44 - 1.00 mg/dL Final  06/19/7251 66:44 AM 1.24 (H) 0.44 - 1.00 mg/dL Final  03/47/4259 56:38 AM 1.21 (H) 0.44 - 1.00 mg/dL Final  75/64/3329 51:88 AM 0.97 0.44 - 1.00 mg/dL Final  41/66/0630 16:01 AM 0.82 0.44 - 1.00 mg/dL Final  09/32/3557 32:20 AM 0.77 0.44 - 1.00 mg/dL Final  25/42/7062 37:62 AM 0.88 0.44 - 1.00 mg/dL Final   83/15/1761 60:73 AM 0.94 0.44 - 1.00 mg/dL Final  71/11/2692 85:46 AM 1.26 (H) 0.44 - 1.00 mg/dL Final  27/08/5007 38:18 PM 1.44 (H) 0.44 - 1.00 mg/dL Final  29/93/7169 67:89 AM 1.81 (H) 0.44 - 1.00 mg/dL Final  38/03/1750 02:58 PM 2.07 (H) 0.44 - 1.00 mg/dL Final  52/77/8242 35:36 PM 2.44 (H) 0.44 - 1.00 mg/dL Final  14/43/1540 08:67 AM 1.38 (H) 0.44 - 1.00 mg/dL Final  61/95/0932 67:12 PM 1.42 (H) 0.44 - 1.00 mg/dL Final  45/80/9983 38:25 AM 0.95 0.44 - 1.00 mg/dL Final  05/39/7673 41:93 AM 1.15 (H) 0.44 - 1.00 mg/dL Final  79/07/4095 35:32 PM 1.32 (H) 0.44 - 1.00 mg/dL Final  99/24/2683 41:96 AM 1.08 (H) 0.44 - 1.00 mg/dL Final  22/29/7989 21:19 AM 1.23 (H) 0.44 - 1.00 mg/dL Final  41/74/0814 48:18 AM 1.48 (H) 0.44 - 1.00 mg/dL Final  56/31/4970 26:37 AM 1.58 (H) 0.44 - 1.00 mg/dL Final  85/88/5027 74:12 AM 1.05 (H) 0.57 - 1.00 mg/dL Final  87/86/7672 09:47 AM 1.11 (H) 0.57 - 1.00 mg/dL Final  09/62/8366 29:47 AM 1.10 (H) 0.57 - 1.00 mg/dL Final  65/46/5035 46:56 AM 1.55 (H) 0.57 - 1.00 mg/dL Final  81/27/5170 01:74 AM 1.12 (H) 0.57 - 1.00 mg/dL Final  94/49/6759 16:38 PM 1.01 (H) 0.44 - 1.00 mg/dL Final  46/65/9935 70:17 AM 1.26 (H) 0.57 - 1.00 mg/dL Final  79/39/0300 92:33 AM 1.06 (H) 0.57 - 1.00 mg/dL Final  00/76/2263 33:54 AM 1.16 (H) 0.57 - 1.00 mg/dL Final  56/25/6389 37:34 PM 0.98 0.44 - 1.00 mg/dL Final   Recent Labs  Lab 09/04/23 0001 09/04/23 0620 09/04/23 0800 09/04/23 1626 09/04/23 2000 09/04/23 2356 09/05/23 0307 09/05/23 0822 09/05/23 1315 09/05/23 1700 09/05/23 2021 09/06/23 0356  NA 121* 122*   < > 123*  120*   < > 129* 129* 124* 130* 130* 130* 129*  129*  K 5.5* 3.9   < > 3.8  --   --  3.5 3.6 3.4* 3.5 3.4* 3.3*  3.2*  CL 73* 81*   < > 82*  --   --  89* 87* 92* 91* 91* 89*  88*  CO2 18* 18*   < > 22  --   --  25 26 25 27 27 27  28   GLUCOSE 83 137*   < > 307*  --   --  136* 249* 226* 83 126* 62*  61*  BUN 171* 135*   < > 75*  --   --  46* 36*  27* 25* 26* 28*  25*  CREATININE 10.11* 7.69*   < > 5.05*  --   --  3.32* 2.95* 2.55* 2.38* 2.63* 2.91*  2.88*  CALCIUM 8.5* 8.0*   < > 7.8*  --   --  8.3* 7.9* 8.3* 8.3* 8.6* 8.9  8.9  PHOS >  30.0* 9.3*  --  5.3*  --   --  4.2  --   --   --   --  2.8   < > = values in this interval not displayed.   Recent Labs  Lab 09/03/23 2220 09/04/23 0001 09/05/23 0307 09/05/23 0454 09/05/23 0822 09/05/23 1315 09/05/23 1700 09/06/23 0356  WBC 11.9*   < > 6.6  --  8.7 9.8  --  11.6*  NEUTROABS 10.4*  --   --   --   --   --   --   --   HGB 15.2*   < > 7.2* 7.4* 7.0* 7.2* 7.4* 8.0*  HCT RESULTS UNAVAILABLE DUE TO INTERFERING SUBSTANCE   < > 19.4* 20.2* 19.0* 19.9* 20.2* 21.9*  MCV RESULTS UNAVAILABLE DUE TO INTERFERING SUBSTANCE   < > 91.5  --  93.1 93.9  --  93.6  PLT 334   < > 94* 104* 92* 106*  --  113*   < > = values in this interval not displayed.   Liver Function Tests: Recent Labs  Lab 09/03/23 1703 09/04/23 0620 09/04/23 1626 09/05/23 0307 09/06/23 0356  AST 30  --   --   --   --   ALT 54*  --   --   --   --   ALKPHOS 195*  --   --   --   --   BILITOT 0.8  --   --   --   --   PROT 8.0  --   --   --   --   ALBUMIN 4.3   < > 2.9* 3.3* 3.0*   < > = values in this interval not displayed.   No results for input(s): "LIPASE", "AMYLASE" in the last 168 hours. No results for input(s): "AMMONIA" in the last 168 hours. Cardiac Enzymes: No results for input(s): "CKTOTAL", "CKMB", "CKMBINDEX", "TROPONINI" in the last 168 hours. Iron Studies: No results for input(s): "IRON", "TIBC", "TRANSFERRIN", "FERRITIN" in the last 72 hours. PT/INR: @LABRCNTIP (inr:5)  Xrays/Other Studies: ) Results for orders placed or performed during the hospital encounter of 09/03/23 (from the past 48 hours)  Basic metabolic panel     Status: Abnormal   Collection Time: 09/04/23 11:28 AM  Result Value Ref Range   Sodium 123 (L) 135 - 145 mmol/L   Potassium 3.7 3.5 - 5.1 mmol/L   Chloride 84 (L) 98 - 111  mmol/L   CO2 20 (L) 22 - 32 mmol/L   Glucose, Bld 198 (H) 70 - 99 mg/dL    Comment: Glucose reference range applies only to samples taken after fasting for at least 8 hours.   BUN 100 (H) 8 - 23 mg/dL   Creatinine, Ser 1.47 (H) 0.44 - 1.00 mg/dL   Calcium 7.8 (L) 8.9 - 10.3 mg/dL   GFR, Estimated 7 (L) >60 mL/min    Comment: (NOTE) Calculated using the CKD-EPI Creatinine Equation (2021)    Anion gap 19 (H) 5 - 15    Comment: Performed at [facility], [address] [place], [place] [address]  Glucose, capillary     Status: Abnormal   Collection Time: 09/04/23 11:42 AM  Result Value Ref Range   Glucose-Capillary 242 (H) 70 - 99 mg/dL    Comment: Glucose reference range applies only to samples taken after fasting for at least 8 hours.  Basic metabolic panel     Status: Abnormal   Collection Time: 09/04/23  1:32 PM  Result Value Ref Range   Sodium 121 (L) 135 - 145 mmol/L   Potassium 3.7 3.5 - 5.1 mmol/L   Chloride 82 (L) 98 - 111 mmol/L   CO2 23 22 - 32 mmol/L   Glucose, Bld 250 (H) 70 - 99 mg/dL    Comment: Glucose reference range applies only to samples taken after fasting for at least 8 hours.   BUN 96 (H) 8 - 23 mg/dL   Creatinine, Ser 5.28 (H) 0.44 - 1.00 mg/dL   Calcium 7.7 (L) 8.9 - 10.3 mg/dL   GFR, Estimated 8 (L) >60 mL/min    Comment: (NOTE) Calculated using the CKD-EPI Creatinine Equation (2021)    Anion gap 16 (H) 5 - 15    Comment: Performed at [facility], [address], [place], [place] [address]  Renal function panel (daily at 1600)     Status: Abnormal   Collection Time: 09/04/23  4:26 PM  Result Value Ref Range   Sodium 123 (L) 135 - 145 mmol/L    Comment: DELTA CHECK NOTED   Potassium 3.8 3.5 - 5.1 mmol/L    Comment: DELTA CHECK NOTED   Chloride 82 (L) 98 - 111 mmol/L   CO2 22 22 - 32 mmol/L   Glucose, Bld 307 (H) 70 - 99 mg/dL    Comment: Glucose reference range applies only to samples taken after fasting for at least 8 hours.   BUN 75  (H) 8 - 23 mg/dL   Creatinine, Ser 4.01 (H) 0.44 - 1.00 mg/dL    Comment: DELTA CHECK NOTED   Calcium 7.8 (L) 8.9 - 10.3 mg/dL   Phosphorus 5.3 (H) 2.5 - 4.6 mg/dL   Albumin 2.9 (L) 3.5 - 5.0 g/dL   GFR, Estimated 9 (L) >60 mL/min    Comment: (NOTE) Calculated using the CKD-EPI Creatinine Equation (2021)    Anion gap 19 (H) 5 - 15    Comment: Performed at [facility] Lab, [address] [place], [place] [address]  Sodium     Status: Abnormal   Collection Time: 09/04/23  4:26 PM  Result Value Ref Range   Sodium 120 (L) 135 - 145 mmol/L    Comment: Performed at [facility], [address], [place], [place] [address]  C-reactive protein     Status: None   Collection Time: 09/04/23  4:26 PM  Result Value Ref Range   CRP 0.8 <1.0 mg/dL    Comment: Performed at [facility], [address], [place], [place] [address]  VITAMIN D 25 Hydroxy (Vit-D Deficiency, Fractures)     Status: Abnormal   Collection Time: 09/04/23  4:26 PM  Result Value Ref Range   Vit D, 25-Hydroxy 17.33 (L) 30 - 100 ng/mL    Comment: (NOTE) Vitamin D deficiency has been defined by the Institute of Medicine  and an Endocrine Society practice guideline as a level of serum 25-OH  vitamin D less than 20 ng/mL (1,2). The Endocrine Society went on to  further define vitamin D insufficiency as a level between 21 and 29  ng/mL (2).  1. IOM (Institute of Medicine). 2010. Dietary reference intakes for  calcium and D. [place]: [employer]. 2. Holick MF, Binkley Joice, Bischoff-Ferrari HA, et al. Evaluation,  treatment, and prevention of vitamin D deficiency: an Endocrine  Society clinical practice guideline, JCEM. 2011 Jul; 96(7): 1911-30.  Performed at [facility] Lab, [address], [place], [place] [address]  Zinc     Status: None   Collection Time: 09/04/23  4:26 PM  Result Value Ref Range   Zinc 55 44 - 115 ug/dL    Comment: (NOTE) This test was developed and its  performance characteristics determined by [facility]. It has not been cleared or approved by the Food and Drug Administration.                                Detection Limit = 5 Performed At: [facility] [address] [place], [place] [id number] Jolene Schimke MD [id number]   Vitamin B12     Status: None   Collection Time: 09/04/23  4:31 PM  Result Value Ref Range   Vitamin B-12 336 180 - 914 pg/mL    Comment: (NOTE) This assay is not validated for testing neonatal or myeloproliferative syndrome specimens for Vitamin B12 levels. Performed at [facility], [address] [place], [place] [address]   Folate     Status: None   Collection Time: 09/04/23  4:31 PM  Result Value Ref Range   Folate 16.9 >5.9 ng/mL    Comment: Performed at [facility], [address] [place], [place] [address]  Glucose, capillary     Status: Abnormal   Collection Time: 09/04/23  4:53 PM  Result Value Ref Range   Glucose-Capillary 306 (H) 70 - 99 mg/dL    Comment: Glucose reference range applies only to samples taken after fasting for at least 8 hours.  CG4 I-STAT (Lactic acid)     Status: Abnormal   Collection Time: 09/04/23  6:18 PM  Result Value Ref Range   Lactic Acid, Venous 6.6 (HH) 0.5 - 1.9 mmol/L  Sodium     Status: Abnormal   Collection Time: 09/04/23  8:00 PM  Result Value Ref Range   Sodium 126 (L) 135 - 145 mmol/L    Comment: DELTA CHECK NOTED Performed at [facility], [address], [place], [place] [address]   Glucose, capillary     Status: Abnormal   Collection Time: 09/04/23  8:04 PM  Result Value Ref Range   Glucose-Capillary 156 (H) 70 - 99 mg/dL    Comment: Glucose reference range applies only to samples taken after fasting for at least 8 hours.  CBC     Status: Abnormal   Collection Time: 09/04/23  8:25 PM  Result Value Ref Range   WBC 10.0 4.0 - 10.5 K/uL   RBC 2.63 (L) 3.87 - 5.11 MIL/uL   Hemoglobin 8.8 (L) 12.0 - 15.0 g/dL    Comment:  REPEATED TO VERIFY RECOLLECTED SAMPLE    HCT 23.9 (L) 36.0 - 46.0 %   MCV 90.9 80.0 - 100.0 fL   MCH 33.5 26.0 - 34.0 pg   MCHC 36.8 (H) 30.0 - 36.0 g/dL    Comment: CORRECTED FOR COLD AGGLUTININS   RDW 17.8 (H) 11.5 - 15.5 %   Platelets 178 150 - 400 K/uL    Comment: REPEATED TO VERIFY   nRBC 0.0 0.0 - 0.2 %    Comment: Performed at [facility] Lab, [address], [place], [place] [address]  Lactic acid, plasma     Status: Abnormal   Collection Time: 09/04/23  9:00 PM  Result Value Ref Range   Lactic Acid, Venous 4.0 (HH) 0.5 - 1.9 mmol/L    Comment: CRITICAL RESULT CALLED TO, READ BACK BY AND  VERIFIED WITH Leotis Shames, RN [id number] 09/04/2023 SANDOVAL K Performed at [facility], [address] [place], [place] [address]   Sodium     Status: Abnormal   Collection Time: 09/04/23 11:56 PM  Result Value Ref Range   Sodium 129 (L) 135 - 145 mmol/L    Comment: DELTA CHECK NOTED Performed at [facility], [address], [place], [place] [address]   Glucose, capillary     Status: Abnormal   Collection Time: 09/05/23 12:00 AM  Result Value Ref Range   Glucose-Capillary 110 (H) 70 - 99 mg/dL    Comment: Glucose reference range applies only to samples taken after fasting for at least 8 hours.  Renal function panel (daily at 0500)     Status: Abnormal   Collection Time: 09/05/23  3:07 AM  Result Value Ref Range   Sodium 129 (L) 135 - 145 mmol/L    Comment: DELTA CHECK NOTED   Potassium 3.5 3.5 - 5.1 mmol/L   Chloride 89 (L) 98 - 111 mmol/L   CO2 25 22 - 32 mmol/L   Glucose, Bld 136 (H) 70 - 99 mg/dL    Comment: Glucose reference range applies only to samples taken after fasting for at least 8 hours.   BUN 46 (H) 8 - 23 mg/dL   Creatinine, Ser 3.66 (H) 0.44 - 1.00 mg/dL    Comment: DELTA CHECK NOTED   Calcium 8.3 (L) 8.9 - 10.3 mg/dL   Phosphorus 4.2 2.5 - 4.6 mg/dL   Albumin 3.3 (L) 3.5 - 5.0 g/dL   GFR, Estimated 15 (L) >60 mL/min    Comment: (NOTE) Calculated  using the CKD-EPI Creatinine Equation (2021)    Anion gap 15 5 - 15    Comment: Performed at [facility] Lab, [address] [place], [place] [address]  Magnesium     Status: None   Collection Time: 09/05/23  3:07 AM  Result Value Ref Range   Magnesium 2.3 1.7 - 2.4 mg/dL    Comment: Performed at [facility], [address] [place], [place] [address]  CBC     Status: Abnormal   Collection Time: 09/05/23  3:07 AM  Result Value Ref Range   WBC 6.6 4.0 - 10.5 K/uL   RBC 2.12 (L) 3.87 - 5.11 MIL/uL   Hemoglobin 7.2 (L) 12.0 - 15.0 g/dL   HCT 56.3 (L) 87.5 - 64.3 %   MCV 91.5 80.0 - 100.0 fL   MCH 34.0 26.0 - 34.0 pg   MCHC 37.1 (H) 30.0 - 36.0 g/dL   RDW 32.9 (H) 51.8 - 84.1 %   Platelets 94 (L) 150 - 400 K/uL    Comment: Immature Platelet Fraction may be clinically indicated, consider ordering this additional test [id number] REPEATED TO VERIFY PLATELET COUNT CONFIRMED BY SMEAR    nRBC 0.0 0.0 - 0.2 %    Comment: Performed at [facility] Lab, [address] [place], [place] [address]  ABO/Rh     Status: None   Collection Time: 09/05/23  3:07 AM  Result Value Ref Range   ABO/RH(D)      A POS Performed at [facility], [address], [place], [place] [address]   Glucose, capillary     Status: Abnormal   Collection Time: 09/05/23  3:12 AM  Result Value Ref Range   Glucose-Capillary 143 (H) 70 - 99 mg/dL    Comment: Glucose reference range applies only to samples  taken after fasting for at least 8 hours.  Hemoglobin and hematocrit, blood     Status: Abnormal   Collection Time: 09/05/23  4:54 AM  Result Value Ref Range   Hemoglobin 7.4 (L) 12.0 - 15.0 g/dL   HCT 29.5 (L) 62.1 - 30.8 %    Comment: Performed at [facility], [address], [place], [place] [address]  Type and screen [facility]     Status: None   Collection Time: 09/05/23  4:54 AM  Result Value Ref Range   ABO/RH(D) A POS    Antibody Screen NEG    Sample  Expiration      09/08/2023,2359 Performed at [facility], [address], [place], [place] [address]   DIC Panel ONCE - STAT     Status: Abnormal   Collection Time: 09/05/23  4:54 AM  Result Value Ref Range   Prothrombin Time 16.7 (H) 11.4 - 15.2 seconds   INR 1.3 (H) 0.8 - 1.2    Comment: (NOTE) INR goal varies based on device and disease states.    aPTT 38 (H) 24 - 36 seconds    Comment:        IF BASELINE aPTT IS ELEVATED, SUGGEST PATIENT RISK ASSESSMENT BE USED TO DETERMINE APPROPRIATE ANTICOAGULANT THERAPY.    Fibrinogen 270 210 - 475 mg/dL    Comment: (NOTE) Fibrinogen results may be underestimated in patients receiving thrombolytic therapy.    D-Dimer, Quant 6.86 (H) 0.00 - 0.50 ug/mL-FEU    Comment: (NOTE) At the manufacturer cut-off value of 0.5 g/mL FEU, this assay has a negative predictive value of 95-100%.This assay is intended for use in conjunction with a clinical pretest probability (PTP) assessment model to exclude pulmonary embolism (PE) and deep venous thrombosis (DVT) in outpatients suspected of PE or DVT. Results should be correlated with clinical presentation.    Platelets 104 (L) 150 - 400 K/uL   Smear Review NO SCHISTOCYTES SEEN     Comment: Performed at [facility], [address], [place], [place] [address]  Glucose, capillary     Status: Abnormal   Collection Time: 09/05/23  8:03 AM  Result Value Ref Range   Glucose-Capillary 495 (H) 70 - 99 mg/dL    Comment: Glucose reference range applies only to samples taken after fasting for at least 8 hours.  Glucose, capillary     Status: Abnormal   Collection Time: 09/05/23  8:04 AM  Result Value Ref Range   Glucose-Capillary 245 (H) 70 - 99 mg/dL    Comment: Glucose reference range applies only to samples taken after fasting for at least 8 hours.  Basic metabolic panel     Status: Abnormal   Collection Time: 09/05/23  8:22 AM  Result Value Ref Range   Sodium 124 (L) 135 - 145 mmol/L     Comment: DELTA CHECK NOTED   Potassium 3.6 3.5 - 5.1 mmol/L   Chloride 87 (L) 98 - 111 mmol/L   CO2 26 22 - 32 mmol/L   Glucose, Bld 249 (H) 70 - 99 mg/dL    Comment: Glucose reference range applies only to samples taken after fasting for at least 8 hours.   BUN 36 (H) 8 - 23 mg/dL   Creatinine, Ser 3.24 (H) 0.44 - 1.00 mg/dL    Comment: DELTA CHECK NOTED   Calcium 7.9 (L) 8.9 - 10.3 mg/dL   GFR, Estimated 17 (L) >60 mL/min    Comment: (NOTE) Calculated  using the CKD-EPI Creatinine Equation (2021)    Anion gap 11 5 - 15    Comment: Performed at [facility], [address] [place], [place] [address]  CBC     Status: Abnormal   Collection Time: 09/05/23  8:22 AM  Result Value Ref Range   WBC 8.7 4.0 - 10.5 K/uL   RBC 2.04 (L) 3.87 - 5.11 MIL/uL   Hemoglobin 7.0 (L) 12.0 - 15.0 g/dL   HCT 40.9 (L) 81.1 - 91.4 %   MCV 93.1 80.0 - 100.0 fL   MCH 34.3 (H) 26.0 - 34.0 pg   MCHC 36.8 (H) 30.0 - 36.0 g/dL   RDW 78.2 (H) 95.6 - 21.3 %   Platelets 92 (L) 150 - 400 K/uL    Comment: Immature Platelet Fraction may be clinically indicated, consider ordering this additional test [id number] REPEATED TO VERIFY    nRBC 0.0 0.0 - 0.2 %    Comment: Performed at [facility] Lab, [address], [place], [place] [address]  Lactic acid, plasma     Status: Abnormal   Collection Time: 09/05/23  8:41 AM  Result Value Ref Range   Lactic Acid, Venous 2.9 (HH) 0.5 - 1.9 mmol/L    Comment: CRITICAL VALUE NOTED. VALUE IS CONSISTENT WITH PREVIOUSLY REPORTED/CALLED VALUE Performed at [facility], [address] [place], [place] [address]   Glucose, capillary     Status: Abnormal   Collection Time: 09/05/23 11:03 AM  Result Value Ref Range   Glucose-Capillary 246 (H) 70 - 99 mg/dL    Comment: Glucose reference range applies only to samples taken after fasting for at least 8 hours.  Urine Culture (for pregnant, neutropenic or urologic patients or patients with an indwelling urinary  catheter)     Status: None (Preliminary result)   Collection Time: 09/05/23  1:15 PM   Specimen: Urine, Catheterized  Result Value Ref Range   Specimen Description URINE, CATHETERIZED    Special Requests NONE    Culture      CULTURE REINCUBATED FOR BETTER GROWTH Performed at [facility], [address] [place], [place] [address]    Report Status PENDING   CBC     Status: Abnormal   Collection Time: 09/05/23  1:15 PM  Result Value Ref Range   WBC 9.8 4.0 - 10.5 K/uL   RBC 2.12 (L) 3.87 - 5.11 MIL/uL   Hemoglobin 7.2 (L) 12.0 - 15.0 g/dL   HCT 02.7 (L) 25.3 - 66.4 %   MCV 93.9 80.0 - 100.0 fL   MCH 34.0 26.0 - 34.0 pg   MCHC 36.2 (H) 30.0 - 36.0 g/dL   RDW 40.3 (H) 47.4 - 25.9 %   Platelets 106 (L) 150 - 400 K/uL    Comment: REPEATED TO VERIFY   nRBC 0.0 0.0 - 0.2 %    Comment: Performed at [facility] Lab, [address] [place], [place] [address]  Basic metabolic panel     Status: Abnormal   Collection Time: 09/05/23  1:15 PM  Result Value Ref Range   Sodium 130 (L) 135 - 145 mmol/L    Comment: DELTA CHECK NOTED   Potassium 3.4 (L) 3.5 - 5.1 mmol/L   Chloride 92 (L) 98 - 111 mmol/L   CO2 25 22 - 32 mmol/L   Glucose, Bld 226 (H) 70 - 99 mg/dL    Comment: Glucose reference range applies only to samples taken after fasting for at  least 8 hours.   BUN 27 (H) 8 - 23 mg/dL   Creatinine, Ser 1.61 (H) 0.44 - 1.00 mg/dL    Comment: DELTA CHECK NOTED   Calcium 8.3 (L) 8.9 - 10.3 mg/dL   GFR, Estimated 20 (L) >60 mL/min    Comment: (NOTE) Calculated using the CKD-EPI Creatinine Equation (2021)    Anion gap 13 5 - 15    Comment: Performed at [facility], [address] [place], [place] [address]  Urinalysis, Routine w reflex microscopic -Urine, Random     Status: Abnormal   Collection Time: 09/05/23  1:42 PM  Result Value Ref Range   Color, Urine YELLOW YELLOW   APPearance CLOUDY (A) CLEAR   Specific Gravity, Urine 1.015 1.005 - 1.030   pH 8.5 (H) 5.0 - 8.0    Glucose, UA NEGATIVE NEGATIVE mg/dL   Hgb urine dipstick SMALL (A) NEGATIVE   Bilirubin Urine NEGATIVE NEGATIVE   Ketones, ur NEGATIVE NEGATIVE mg/dL   Protein, ur 540 (A) NEGATIVE mg/dL   Nitrite NEGATIVE NEGATIVE   Leukocytes,Ua LARGE (A) NEGATIVE    Comment: Performed at [facility], [address], [place], [place] [address]  Urinalysis, Microscopic (reflex)     Status: Abnormal   Collection Time: 09/05/23  1:42 PM  Result Value Ref Range   RBC / HPF 6-10 0 - 5 RBC/hpf   WBC, UA 21-50 0 - 5 WBC/hpf   Bacteria, UA FEW (A) NONE SEEN   Squamous Epithelial / HPF 0-5 0 - 5 /HPF   Budding Yeast PRESENT     Comment: Performed at [facility], [address] [place], [place] [address]  Glucose, capillary     Status: Abnormal   Collection Time: 09/05/23  4:05 PM  Result Value Ref Range   Glucose-Capillary 181 (H) 70 - 99 mg/dL    Comment: Glucose reference range applies only to samples taken after fasting for at least 8 hours.  Hemoglobin and hematocrit, blood     Status: Abnormal   Collection Time: 09/05/23  5:00 PM  Result Value Ref Range   Hemoglobin 7.4 (L) 12.0 - 15.0 g/dL   HCT 95.6 (L) 21.3 - 08.6 %    Comment: Performed at [facility], [address] [place], [place] [address]  Basic metabolic panel     Status: Abnormal   Collection Time: 09/05/23  5:00 PM  Result Value Ref Range   Sodium 130 (L) 135 - 145 mmol/L   Potassium 3.5 3.5 - 5.1 mmol/L   Chloride 91 (L) 98 - 111 mmol/L   CO2 27 22 - 32 mmol/L   Glucose, Bld 83 70 - 99 mg/dL    Comment: Glucose reference range applies only to samples taken after fasting for at least 8 hours.   BUN 25 (H) 8 - 23 mg/dL   Creatinine, Ser 9.62 (H) 0.44 - 1.00 mg/dL   Calcium 8.3 (L) 8.9 - 10.3 mg/dL   GFR, Estimated 22 (L) >60 mL/min    Comment: (NOTE) Calculated using the CKD-EPI Creatinine Equation (2021)    Anion gap 12 5 - 15    Comment: Performed at [facility], [address]  [place], [place] [address]  Glucose, capillary     Status: None   Collection Time: 09/05/23  7:51 PM  Result Value Ref Range   Glucose-Capillary 95 70 - 99 mg/dL    Comment: Glucose reference range applies only to samples taken  after fasting for at least 8 hours.  Basic metabolic panel     Status: Abnormal   Collection Time: 09/05/23  8:21 PM  Result Value Ref Range   Sodium 130 (L) 135 - 145 mmol/L    Comment: DELTA CHECK NOTED   Potassium 3.4 (L) 3.5 - 5.1 mmol/L   Chloride 91 (L) 98 - 111 mmol/L   CO2 27 22 - 32 mmol/L   Glucose, Bld 126 (H) 70 - 99 mg/dL    Comment: Glucose reference range applies only to samples taken after fasting for at least 8 hours.   BUN 26 (H) 8 - 23 mg/dL   Creatinine, Ser 1.61 (H) 0.44 - 1.00 mg/dL    Comment: DELTA CHECK NOTED   Calcium 8.6 (L) 8.9 - 10.3 mg/dL   GFR, Estimated 19 (L) >60 mL/min    Comment: (NOTE) Calculated using the CKD-EPI Creatinine Equation (2021)    Anion gap 12 5 - 15    Comment: Performed at [facility], [address], [place], [place] [address]  Glucose, capillary     Status: Abnormal   Collection Time: 09/05/23 11:17 PM  Result Value Ref Range   Glucose-Capillary 175 (H) 70 - 99 mg/dL    Comment: Glucose reference range applies only to samples taken after fasting for at least 8 hours.  Glucose, capillary     Status: Abnormal   Collection Time: 09/06/23 12:12 AM  Result Value Ref Range   Glucose-Capillary 155 (H) 70 - 99 mg/dL    Comment: Glucose reference range applies only to samples taken after fasting for at least 8 hours.  Glucose, capillary     Status: Abnormal   Collection Time: 09/06/23  3:29 AM  Result Value Ref Range   Glucose-Capillary 156 (H) 70 - 99 mg/dL    Comment: Glucose reference range applies only to samples taken after fasting for at least 8 hours.  Basic metabolic panel     Status: Abnormal   Collection Time: 09/06/23  3:56 AM  Result Value Ref Range   Sodium 129 (L) 135 - 145 mmol/L    Potassium 3.3 (L) 3.5 - 5.1 mmol/L   Chloride 89 (L) 98 - 111 mmol/L   CO2 27 22 - 32 mmol/L   Glucose, Bld 62 (L) 70 - 99 mg/dL    Comment: Glucose reference range applies only to samples taken after fasting for at least 8 hours.   BUN 28 (H) 8 - 23 mg/dL   Creatinine, Ser 5.40 (H) 0.44 - 1.00 mg/dL   Calcium 8.9 8.9 - 98.1 mg/dL   GFR, Estimated 17 (L) >60 mL/min    Comment: (NOTE) Calculated using the CKD-EPI Creatinine Equation (2021)    Anion gap 13 5 - 15    Comment: Performed at [facility], [address], [place], [place] [address]  Renal function panel (daily at 0500)     Status: Abnormal   Collection Time: 09/06/23  3:56 AM  Result Value Ref Range   Sodium 129 (L) 135 - 145 mmol/L   Potassium 3.2 (L) 3.5 - 5.1 mmol/L   Chloride 88 (L) 98 - 111 mmol/L   CO2 28 22 - 32 mmol/L   Glucose, Bld 61 (L) 70 - 99 mg/dL    Comment: Glucose reference range applies only to samples taken after fasting for at least 8 hours.   BUN 25 (H) 8 - 23 mg/dL   Creatinine, Ser 8.29 (H) 0.44 - 1.00 mg/dL  Calcium 8.9 8.9 - 10.3 mg/dL   Phosphorus 2.8 2.5 - 4.6 mg/dL   Albumin 3.0 (L) 3.5 - 5.0 g/dL   GFR, Estimated 17 (L) >60 mL/min    Comment: (NOTE) Calculated using the CKD-EPI Creatinine Equation (2021)    Anion gap 13 5 - 15    Comment: Performed at [facility], [address], [place], [place] [address]  Magnesium     Status: None   Collection Time: 09/06/23  3:56 AM  Result Value Ref Range   Magnesium 2.0 1.7 - 2.4 mg/dL    Comment: Performed at [facility], [address], [place], [place] [address]  CBC     Status: Abnormal   Collection Time: 09/06/23  3:56 AM  Result Value Ref Range   WBC 11.6 (H) 4.0 - 10.5 K/uL   RBC 2.34 (L) 3.87 - 5.11 MIL/uL   Hemoglobin 8.0 (L) 12.0 - 15.0 g/dL   HCT 82.9 (L) 56.2 - 13.0 %   MCV 93.6 80.0 - 100.0 fL   MCH 34.2 (H) 26.0 - 34.0 pg   MCHC 36.5 (H) 30.0 - 36.0 g/dL   RDW 86.5 (H) 78.4 - 69.6 %   Platelets 113 (L)  150 - 400 K/uL   nRBC 0.0 0.0 - 0.2 %    Comment: Performed at [facility], [address], [place], [place] [address]  Glucose, capillary     Status: Abnormal   Collection Time: 09/06/23  4:05 AM  Result Value Ref Range   Glucose-Capillary 57 (L) 70 - 99 mg/dL    Comment: Glucose reference range applies only to samples taken after fasting for at least 8 hours.  Glucose, capillary     Status: None   Collection Time: 09/06/23  4:24 AM  Result Value Ref Range   Glucose-Capillary 80 70 - 99 mg/dL    Comment: Glucose reference range applies only to samples taken after fasting for at least 8 hours.  Glucose, capillary     Status: Abnormal   Collection Time: 09/06/23  8:59 AM  Result Value Ref Range   Glucose-Capillary 551 (HH) 70 - 99 mg/dL    Comment: Glucose reference range applies only to samples taken after fasting for at least 8 hours.   No results found.   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   Thyroid disease    Vertigo     PSH:   Past Surgical History:  Procedure Laterality Date   ABDOMINAL ADHESION SURGERY  11/08/2020   Dr [name] - [facility]   ABDOMINAL HYSTERECTOMY  2003   [facility] - Dr [name] - for cervical cancer   BILE DUCT EXPLORATION  10/22/2003   CHOLECYSTECTOMY OPEN  10/22/2003   Dr Zachery Dakins - CBD repair w T tube   CYSTOSCOPY  2021   ILEOSTOMY  11/23/2018   ILEOSTOMY CLOSURE  04/2020   IR PATIENT EVAL TECH 0-60 MINS  06/20/2023   LOW ANTERIOR BOWEL RESECTION  11/23/2018   LAR/takedown colovaginal fistula/loop ileostomy  - [facility]    Allergies:  Allergies  Allergen Reactions   Barium-Containing Compounds     hives   Percocet [Oxycodone-Acetaminophen] Itching and Other (See Comments)    migraine     Medications:   Prior to Admission  medications   Medication Sig Start Date End Date Taking?  Authorizing Provider  acetaminophen (TYLENOL) 500 MG tablet Take 1,000 mg by mouth every 6 (six) hours as needed.   Yes [provider]  amoxicillin (AMOXIL) 500 MG capsule Take 500 mg by mouth 3 (three) times daily. 08/27/23  Yes [provider]  cetirizine (ZYRTEC) 10 MG tablet Take 10 mg by mouth daily as needed for allergies or rhinitis.   Yes [provider]  cholestyramine light (PREVALITE) 4 g packet Take 4 g by mouth 2 (two) times daily. 08/08/23 11/06/23 Yes [provider]  dronabinol (MARINOL) 2.5 MG capsule Take 2.5 mg by mouth 2 (two) times daily as needed (Appetite).   Yes [provider]  fluticasone (FLONASE) 50 MCG/ACT nasal spray Place 1 spray into both nostrils daily as needed for allergies or rhinitis. 08/30/22  Yes [provider]  levothyroxine (SYNTHROID) 25 MCG tablet Take 1 tablet (25 mcg total) by mouth daily. 07/17/22  Yes Delynn Flavin M, DO  loperamide (IMODIUM) 2 MG capsule Take 2 mg by mouth 3 (three) times daily as needed for diarrhea or loose stools. 09/03/23  Yes [provider]  mirtazapine (REMERON) 7.5 MG tablet Take 7.5 mg by mouth at bedtime as needed (Anxiety/appetite).   Yes [provider]  Multiple Vitamins-Minerals (MULTIVITAMIN) tablet Take 1 tablet by mouth daily.   Yes [provider]  omeprazole (PRILOSEC) 20 MG capsule Take 20 mg by mouth daily as needed (Acid reflux). 05/21/23  Yes [provider]  ondansetron (ZOFRAN) 4 MG tablet Take 4 mg by mouth every 6 (six) hours as needed for nausea or vomiting.   Yes [provider]  promethazine (PHENERGAN) 12.5 MG tablet Take 12.5 mg by mouth every 6 (six) hours as needed for nausea or vomiting.   Yes [provider]  diphenoxylate-atropine (LOMOTIL) 2.5-0.025 MG tablet Take 2 tablets by mouth 3 (three) times daily as needed for diarrhea or loose stools. Patient not taking: Reported on 09/04/2023     [provider]  fluconazole (DIFLUCAN) 150 MG tablet Take 150 mg by mouth daily. For 7 days Patient not taking: Reported on 09/04/2023 08/11/23   [provider]  simvastatin (ZOCOR) 20 MG tablet Take 20 mg by mouth at bedtime. Patient not taking: Reported on 09/04/2023    [provider]    Discontinued Meds:   Medications Discontinued During This Encounter  Medication Reason   0.9 %  sodium chloride infusion    cefTRIAXone (ROCEPHIN) 1 g in sodium chloride 0.9 % 100 mL IVPB    norepinephrine (LEVOPHED) 4mg  in (0.016 mg/mL) premix infusion Entry Error   sodium chloride 1 g tablet Completed Course   sertraline (ZOLOFT) 25 MG tablet Discontinued by provider   norepinephrine (LEVOPHED) 4mg  in (0.016 mg/mL) premix infusion    Amino Acid Infusion in D15W (CLINIMIX/DEXTROSE, 5/15, IV) Discontinued by provider   Fat Emulsion Plant Based (FAT EMULSION 20 %, TPN/TNA, FOR ANES DOCUMENTATION) 20 % EMUL Discontinued by provider   divalproex (DEPAKOTE) 125 MG DR tablet Discontinued by provider   Lactobacillus Rhamnosus, GG, (CULTURELLE HEALTH & WELLNESS) CAPS Discontinued by provider   mupirocin ointment (BACTROBAN) 2 % Patient Preference   promethazine (PHENERGAN) 12.5 MG tablet Patient Preference   polyethylene glycol powder (GLYCOLAX/MIRALAX) 17 GM/SCOOP powder Patient Preference   rizatriptan (MAXALT-MLT) 10 MG disintegrating tablet Patient Preference   hydrOXYzine (ATARAX) 25 MG tablet Patient Preference   Oyster Shell Calcium 500 MG  TABS Patient Preference   Multiple Vitamins-Minerals (ONCOVITE) TABS Patient Preference   sodium zirconium cyclosilicate (LOKELMA) packet 5 g    prismasol BGK 0/2.5 infusion    prismasol BGK 0/2.5 infusion    dextrose 5 % solution    insulin aspart (novoLOG) injection 0-6 Units    fluconazole (DIFLUCAN) IVPB 400 mg    sodium bicarbonate 150 mEq in sterile water 1,150 mL infusion    insulin aspart (novoLOG) injection 0-9  Units    prismasol BGK 4/2.5 infusion    prismasol BGK 4/2.5 infusion    prismasol BGK 4/2.5 infusion    vancomycin (VANCOREADY) IVPB 500 mg/100 mL    dexmedetomidine (PRECEDEX) 400 MCG/100ML (4 mcg/mL) infusion    piperacillin-tazobactam (ZOSYN) IVPB 3.375 g     Social History:  reports that she has quit smoking. Her smoking use included cigarettes. She has never used smokeless tobacco. She reports that she does not currently use alcohol. She reports that she does not use drugs.  Family History:   Family History  Problem Relation Age of Onset   Heart disease Father    Diabetes Father    Parkinson's disease Neg Hx     Blood pressure (!) 85/52, pulse 93, temperature 98.1 F (36.7 C), temperature source Oral, resp. rate 19, height 5\' 2"  (1.575 m), weight 36.8 kg, SpO2 100%. Physical Exam: General exam: Status markedly improved.  Able to converse appropriately, cachectic, not in distress Respiratory system: Clear to auscultation. Respiratory effort normal. No wheezing or crackle Cardiovascular system: S1 & S2 heard, RRR.  No pedal edema. Gastrointestinal system: Abdomen is nondistended, ileostomy bag with loose stool connected to drainage. Central nervous system: Tremors present Extremities: No edema. Skin: No rashes Access: RIJ temp (3/19)     Ethelene Hal, MD 09/06/2023, 9:50 AM

## 2023-09-06 NOTE — Progress Notes (Signed)
 NAME:  [name], MRN:  [id number], DOB:  07-10-1955, LOS: 3 ADMISSION DATE:  09/03/2023, CONSULTATION DATE: 09/03/23  REFERRING MD:  ED MD Rubin Payor  CHIEF COMPLAINT:  Abnormal Lab work   History of Present Illness:  Pt is a 68 yr female with a significant past medical history for cervical cancer 2003 s/p hysterectomy, hx of TPN use/malnutrition, right hydronephrosis s/p uretal stent 03/2023 and R nephrostomy tube placed in 04/2023 due to hospitalization at [facility] for obstructive uropathy AKI and BL hydronephrosis complicated by Enterobacter and Candida UTI,  rectovaginal s/p anterior resection with colovaginal fistula repair, loop ileostomy with closure in 2021, SBO s/p with lysis of adhesions, recurrent SBO, ileum/cecum resection with ileostomy creation 2024, and recent [facility] hospitalization (2/14-2/23/25) which revealed patient had severe dehydration in the setting of high output and poor oral intake who presents to [facility] ED from [facility] due to abnormal lab work indicating acute renal failure (Cr >10, BUN >169, Anion Gap acidosis, severe hyperkalemia-k>6, and hyponatremia Na 117. Due to severity of acute kidney failure and acidosis, PCCM was consulted for ICU admission and further management along with nephrology consult.  Upon initial assessment, patient sitting up in ED stretcher with husband at beside. Patient with intermittent confusion, and disoriented to situation/time. Therefore husband able to provide and assist with HPI. According to husband, had initial appointment with MD Sandford with [facility] and when lab work came back, husband was advised to take patient to [facility] ED immediately. Per husband, over the last few weeks-patient has had issues with sleeping, more fatigued, and having poor oral intake. Husband endorses that ileostomy and right side nephrostomy tube have had moderate output. Patient states that she feels nauseous and sick on her stomach but no vomiting  currently. Husband denies patient having any chills/fevers, and or sick contacts. Patient tachycardic but normotensive, on room air, O2 sats 99 to 100%,  and malnourished.   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1 F (36.7 C), temperature source Oral, resp. rate 19, height 5\' 2"  (1.575 m), weight 36.8 kg, SpO2 100%.        Intake/Output Summary (Last 24 hours) at 09/06/2023 0918 Last data filed at 09/06/2023 0801 Gross per 24 hour  Intake 2317.73 ml  Output 3662.68 ml  Net -1344.95 ml   Filed Weights   09/04/23 0419 09/05/23 0500 09/06/23 0412  Weight: 37.8 kg 37.2 kg 36.8 kg   Physical exam: General: Acute on chronically ill-appearing female, lying on the bed HEENT: [place]/AT, eyes anicteric.  Dry mucus membranes Neuro: Awake, disoriented, following simple commands, antigravity in all 4 extremities Chest: Coarse breath  sounds, no wheezes or rhonchi Heart: Regular rate and rhythm, no murmurs or gallops Abdomen: Soft, nontender, nondistended, bowel sounds present.  Right nephrostomy tube in place. Ostomy with brown stool noted Skin: No rash   Labs Serum sodium improved to 129 Creat 2.9 K 3.3 Wbc  11.6 Hgb 8.0  Lines 3/19 R internal jugular HD catheter  Micro  3/20 Bcx2> NGTD   I/O 1968 UOP 550 cc in last 24 hours Resolved Hospital Problem list   N/a   Assessment & Plan:  Acute kidney injury due to ischemic ATN in the setting of shock Acute metabolic acidosis Uremic encephalopathy Hyperkalemia with hyponatremia Ct abd/pelvis without obstruction and hydronephrosis  2024-obstructive uropathy AKI and BL hydronephrosis complicated by  Enterobacter and Candida UTI Patient required CRRT, it was stopped overnight She is making urine but persistently had diarrhea, looks dehydrated Will give gentle IV fluid hydration with LR 100 cc/h for total of 2 L Acidosis has improved Mental status waxing and waning Stop Precedex Hyperkalemia has corrected Serum sodium improved to 129 Neuro delirium precautions Monitor electrolytes  Septic shock due to acute urinary tract infection She was complaining of dysuria few days prior to admission She was prescribed oral antibiotics UA was suggestive of UTI Urine culture is pending Continue IV Zosyn Vancomycin was stopped Vasopressor requirement has improved Continue to titrate with MAP goal 65  SBO s/p with lysis of adhesions Recurrent SBO hx  recurrent SBO, ileum/cecum resection with ileostomy creation 2024 Recent admission for severe dehydration and high output  Patient continued high output via ostomy, it was 3.1 L yesterday Started on Imodium Managed with IV fluid  Severe malnutrition  Hx of TPN use  Continue dysphagia diet  Prediabetes with hyperglycemia A1c 5.7% Patient blood sugars are in 300 Started on Lantus 10 units twice daily Continue sliding scale insulin  Anemia and thrombocytopenia of critical illness Upon presentation patient's hemoglobin was 13, she was severely dehydrated After IV fluid resuscitation hemoglobin dropped, currently stable at 8 No signs of active bleeding Monitor platelet count   Best  Practice (right click and "Reselect all SmartList Selections" daily)   Diet/type: Regular consistency (see orders) DVT prophylaxis prophylactic heparin  Pressure ulcer(s): N/A GI prophylaxis: PPI Lines: N/A Foley:  N/A Code Status:  full code Last date of multidisciplinary goals of care discussion: 3/22: Patient's husband and daughter were updated at bedside.  Decided to continue full scope of care  Labs   CBC: Recent Labs  Lab 09/03/23 2220 09/04/23 0001 09/04/23 2025 09/05/23 0307 09/05/23 0454 09/05/23 0822 09/05/23 1315 09/05/23 1700 09/06/23 0356  WBC 11.9*   < > 10.0 6.6  --  8.7 9.8  --  11.6*  NEUTROABS 10.4*  --   --   --   --   --   --   --   --   HGB 15.2*   < > 8.8* 7.2* 7.4* 7.0* 7.2* 7.4* 8.0*  HCT RESULTS UNAVAILABLE DUE TO INTERFERING SUBSTANCE   < > 23.9* 19.4* 20.2* 19.0* 19.9* 20.2* 21.9*  MCV RESULTS UNAVAILABLE DUE TO INTERFERING SUBSTANCE   < > 90.9 91.5  --  93.1 93.9  --  93.6  PLT 334   < > 178 94* 104* 92* 106*  --  113*   < > = values in this interval not displayed.    Basic Metabolic Panel: Recent Labs  Lab 09/04/23 0001 09/04/23 0618 09/04/23 2956 09/04/23 0800 09/04/23 1626 09/04/23 2000 09/05/23 2130 09/05/23 8657 09/05/23 1315  09/05/23 1700 09/05/23 2021 09/06/23 0356  NA 121*  --  122*   < > 123*  120*   < > 129* 124* 130* 130* 130* 129*  129*  K 5.5*  --  3.9   < > 3.8  --  3.5 3.6 3.4* 3.5 3.4* 3.3*  3.2*  CL 73*  --  81*   < > 82*  --  89* 87* 92* 91* 91* 89*  88*  CO2 18*  --  18*   < > 22  --  25 26 25 27 27 27  28   GLUCOSE 83  --  137*   < > 307*  --  136* 249* 226* 83 126* 62*  61*  BUN 171*  --  135*   < > 75*  --  46* 36* 27* 25* 26* 28*  25*  CREATININE 10.11*  --  7.69*   < > 5.05*  --  3.32* 2.95* 2.55* 2.38* 2.63* 2.91*  2.88*  CALCIUM 8.5*  --  8.0*   < > 7.8*  --  8.3* 7.9* 8.3* 8.3* 8.6* 8.9  8.9  MG 1.4* 1.5*  --   --   --   --  2.3  --   --   --   --  2.0  PHOS >30.0*  --  9.3*  --  5.3*  --  4.2  --    --   --   --  2.8   < > = values in this interval not displayed.   GFR: Estimated Creatinine Clearance: 10.9 mL/min (A) (by C-G formula based on SCr of 2.91 mg/dL (H)). Recent Labs  Lab 09/04/23 0001 09/04/23 1818 09/04/23 2025 09/04/23 2100 09/05/23 0307 09/05/23 0822 09/05/23 0841 09/05/23 1315 09/06/23 0356  PROCALCITON 0.82  --   --   --   --   --   --   --   --   WBC 11.9*  --    < >  --  6.6 8.7  --  9.8 11.6*  LATICACIDVEN 1.8 6.6*  --  4.0*  --   --  2.9*  --   --    < > = values in this interval not displayed.    Liver Function Tests: Recent Labs  Lab 09/03/23 1703 09/04/23 0620 09/04/23 1626 09/05/23 0307 09/06/23 0356  AST 30  --   --   --   --   ALT 54*  --   --   --   --   ALKPHOS 195*  --   --   --   --   BILITOT 0.8  --   --   --   --   PROT 8.0  --   --   --   --   ALBUMIN 4.3 3.4* 2.9* 3.3* 3.0*   No results for input(s): "LIPASE", "AMYLASE" in the last 168 hours. No results for input(s): "AMMONIA" in the last 168 hours.  ABG    Component Value Date/Time   HCO3 12.5 (L) 04/28/2023 2354   ACIDBASEDEF 12.4 (H) 04/28/2023 2354   O2SAT 29.2 04/28/2023 2354     Coagulation Profile: Recent Labs  Lab 09/03/23 2220 09/05/23 0454  INR 1.1 1.3*    Cardiac Enzymes: No results for input(s): "CKTOTAL", "CKMB", "CKMBINDEX", "TROPONINI" in the last 168 hours.  HbA1C: HB A1C (BAYER DCA - WAIVED)  Date/Time Value Ref Range Status  12/11/2020 10:13 AM 4.7 <7.0 % Final    Comment:  Diabetic Adult            <7.0                                       Healthy Adult        4.3 - 5.7                                                           (DCCT/NGSP) American Diabetes Association's Summary of Glycemic Recommendations for Adults with Diabetes: Hemoglobin A1c <7.0%. More stringent glycemic goals (A1c <6.0%) may further reduce complications at the cost of increased risk of hypoglycemia.    Hgb A1c MFr Bld   Date/Time Value Ref Range Status  09/03/2023 10:20 PM 5.7 (H) 4.8 - 5.6 % Final    Comment:    (NOTE) Pre diabetes:          5.7%-6.4%  Diabetes:              >6.4%  Glycemic control for   <7.0% adults with diabetes     CBG: Recent Labs  Lab 09/06/23 0012 09/06/23 0329 09/06/23 0405 09/06/23 0424 09/06/23 0859  GLUCAP 155* 156* 57* 80 551*   The patient is critically ill due to septic shock/AKI.  Critical care was necessary to treat or prevent imminent or life-threatening deterioration.  Critical care was time spent personally by me on the following activities: development of treatment plan with patient and/or surrogate as well as nursing, discussions with consultants, evaluation of patient's response to treatment, examination of patient, obtaining history from patient or surrogate, ordering and performing treatments and interventions, ordering and review of laboratory studies, ordering and review of radiographic studies, pulse oximetry, re-evaluation of patient's condition and participation in multidisciplinary rounds.   During this encounter critical care time was devoted to patient care services described in this note for 39 minutes.     Cheri Fowler, MD [facility] See Amion for pager If no response to pager, please call [phone] until 7pm After 7pm, Please call E-link [phone]

## 2023-09-06 NOTE — Progress Notes (Signed)
 Pharmacy Antibiotic Note  [name] is a 68 y.o. female admitted on 09/03/2023 with  acute renal failure .  Pharmacy has been consulted for Vancomycin/Zosyn dosing for rule out sepsis.  Vancomycin stopped today 3/22. CRRT currently off.  Plan: Adjust Zosyn to 2.25g IV q 8 hrs while CRRT is off. Will continue to follow for LOT.   Height: 5\' 2"  (157.5 cm) Weight: 36.8 kg (81 lb 2.1 oz) IBW/kg (Calculated) : 50.1  Temp (24hrs), Avg:98.2 F (36.8 C), Min:97.8 F (36.6 C), Max:98.6 F (37 C)  Recent Labs  Lab 09/03/23 2031 09/03/23 2220 09/04/23 0001 09/04/23 0620 09/04/23 1818 09/04/23 2025 09/04/23 2100 09/05/23 0307 09/05/23 0822 09/05/23 0841 09/05/23 1315 09/05/23 1700 09/05/23 2021 09/06/23 0356  WBC  --    < > 11.9*  --   --  10.0  --  6.6 8.7  --  9.8  --   --  11.6*  CREATININE 10.34*  --  10.11*   < >  --   --   --  3.32* 2.95*  --  2.55* 2.38* 2.63* 2.91*  2.88*  LATICACIDVEN 3.6*  --  1.8  --  6.6*  --  4.0*  --   --  2.9*  --   --   --   --    < > = values in this interval not displayed.    Estimated Creatinine Clearance: 10.9 mL/min (A) (by C-G formula based on SCr of 2.91 mg/dL (H)).    Allergies  Allergen Reactions   Barium-Containing Compounds     hives   Percocet [Oxycodone-Acetaminophen] Itching and Other (See Comments)    migraine    Fluconazole 3/19 >> 3/20 Zosyn 3/20 >> Vanc 3/20>>   3/19 BCx > ngtd 3/20 BCx x 2 > ngtd 3/21 UCx > reincubated MRSA PCR 3/19: neg   Tad Moore, BCPS, [facility] Clinical Pharmacist  09/06/2023 9:41 AM   [facility] pharmacy phone numbers are listed on [email or web address]

## 2023-09-06 NOTE — Progress Notes (Signed)
 eLink Physician-Brief Progress Note Patient Name: [name] DOB: 04/09/1956 MRN: [id number]   Date of Service  09/06/2023  HPI/Events of Note  Requesting alternative to Zofran for nausea.  Seems to be mild absorbing-Home meds currently held  eICU Interventions  Attempt Compazine.  Continue to hold home meds   0347 -complaining of generalized aching and pain.  On all relevant home meds and this persists.  Received morphine with moderate benefit in the past.  In the setting of renal failure, switch to Dilaudid, 3 doses available  0531 - kcl and magnesium ordered  Intervention Category Minor Interventions: Routine modifications to care plan (e.g. PRN medications for pain, fever)  [name] 09/06/2023, 7:56 PM

## 2023-09-07 DIAGNOSIS — E8721 Acute metabolic acidosis: Secondary | ICD-10-CM | POA: Diagnosis not present

## 2023-09-07 DIAGNOSIS — A419 Sepsis, unspecified organism: Secondary | ICD-10-CM | POA: Diagnosis not present

## 2023-09-07 DIAGNOSIS — R6521 Severe sepsis with septic shock: Secondary | ICD-10-CM

## 2023-09-07 DIAGNOSIS — N179 Acute kidney failure, unspecified: Secondary | ICD-10-CM | POA: Diagnosis not present

## 2023-09-07 DIAGNOSIS — J9601 Acute respiratory failure with hypoxia: Secondary | ICD-10-CM

## 2023-09-07 DIAGNOSIS — G9341 Metabolic encephalopathy: Secondary | ICD-10-CM

## 2023-09-07 LAB — RENAL FUNCTION PANEL
Albumin: 2.6 g/dL — ABNORMAL LOW (ref 3.5–5.0)
Albumin: 2.6 g/dL — ABNORMAL LOW (ref 3.5–5.0)
Anion gap: 13 (ref 5–15)
Anion gap: 13 (ref 5–15)
BUN: 31 mg/dL — ABNORMAL HIGH (ref 8–23)
BUN: 31 mg/dL — ABNORMAL HIGH (ref 8–23)
CO2: 28 mmol/L (ref 22–32)
CO2: 30 mmol/L (ref 22–32)
Calcium: 8.5 mg/dL — ABNORMAL LOW (ref 8.9–10.3)
Calcium: 8.8 mg/dL — ABNORMAL LOW (ref 8.9–10.3)
Chloride: 87 mmol/L — ABNORMAL LOW (ref 98–111)
Chloride: 83 mmol/L — ABNORMAL LOW (ref 98–111)
Creatinine, Ser: 3.13 mg/dL — ABNORMAL HIGH (ref 0.44–1.00)
Creatinine, Ser: 3.08 mg/dL — ABNORMAL HIGH (ref 0.44–1.00)
GFR, Estimated: 16 mL/min — ABNORMAL LOW (ref >60)
GFR, Estimated: 16 mL/min — ABNORMAL LOW (ref >60)
Glucose, Bld: 73 mg/dL (ref 70–99)
Glucose, Bld: 156 mg/dL — ABNORMAL HIGH (ref 70–99)
Phosphorus: 2.7 mg/dL (ref 2.5–4.6)
Phosphorus: 2.5 mg/dL (ref 2.5–4.6)
Potassium: 3.1 mmol/L — ABNORMAL LOW (ref 3.5–5.1)
Potassium: 4.1 mmol/L (ref 3.5–5.1)
Sodium: 128 mmol/L — ABNORMAL LOW (ref 135–145)
Sodium: 126 mmol/L — ABNORMAL LOW (ref 135–145)

## 2023-09-07 LAB — NA AND K (SODIUM & POTASSIUM), RAND UR
Potassium Urine: 88 mmol/L
Sodium, Ur: 11 mmol/L

## 2023-09-07 LAB — GLUCOSE, CAPILLARY
Glucose-Capillary: 60 mg/dL — ABNORMAL LOW (ref 70–99)
Glucose-Capillary: 65 mg/dL — ABNORMAL LOW (ref 70–99)
Glucose-Capillary: 244 mg/dL — ABNORMAL HIGH (ref 70–99)
Glucose-Capillary: 97 mg/dL (ref 70–99)
Glucose-Capillary: 142 mg/dL — ABNORMAL HIGH (ref 70–99)
Glucose-Capillary: 157 mg/dL — ABNORMAL HIGH (ref 70–99)
Glucose-Capillary: 176 mg/dL — ABNORMAL HIGH (ref 70–99)
Glucose-Capillary: 77 mg/dL (ref 70–99)

## 2023-09-07 LAB — MAGNESIUM: Magnesium: 1.4 mg/dL — ABNORMAL LOW (ref 1.7–2.4)

## 2023-09-07 LAB — OSMOLALITY, URINE: Osmolality, Ur: 346 mosm/kg (ref 300–900)

## 2023-09-07 LAB — CREATININE, URINE, RANDOM: Creatinine, Urine: 63 mg/dL

## 2023-09-07 MED ORDER — DEXTROSE 50 % IV SOLN
INTRAVENOUS | Status: AC
  Filled 2023-09-07: qty 50

## 2023-09-07 MED ORDER — GABAPENTIN 100 MG PO CAPS: 100.0000 mg | ORAL_CAPSULE | Freq: Three times a day (TID) | ORAL | Status: DC

## 2023-09-07 MED ORDER — POTASSIUM CHLORIDE 20 MEQ PO PACK
60.0000 meq | PACK | Freq: Once | ORAL | Status: AC
  Administered 2023-09-07: 60 meq via ORAL
  Filled 2023-09-07: qty 3

## 2023-09-07 MED ORDER — MAGNESIUM SULFATE 50 % IJ SOLN
6.0000 g | Freq: Once | INTRAVENOUS | Status: AC
  Administered 2023-09-07: 6 g via INTRAVENOUS
  Filled 2023-09-07: qty 12

## 2023-09-07 MED ORDER — INSULIN GLARGINE 100 UNIT/ML ~~LOC~~ SOLN
5.0000 [IU] | Freq: Every day | SUBCUTANEOUS | Status: DC
  Administered 2023-09-07: 5 [IU] via SUBCUTANEOUS
  Filled 2023-09-07 (×2): qty 0.05

## 2023-09-07 MED ORDER — SODIUM CHLORIDE 1 G PO TABS
1.0000 g | ORAL_TABLET | Freq: Two times a day (BID) | ORAL | Status: DC
  Administered 2023-09-07 – 2023-09-19 (×22): 1 g via ORAL
  Filled 2023-09-07 (×23): qty 1

## 2023-09-07 MED ORDER — DEXTROSE 50 % IV SOLN
12.5000 g | INTRAVENOUS | Status: AC
  Administered 2023-09-07: 12.5 g via INTRAVENOUS

## 2023-09-07 MED ORDER — GABAPENTIN 100 MG PO CAPS
100.0000 mg | ORAL_CAPSULE | Freq: Three times a day (TID) | ORAL | Status: DC
  Administered 2023-09-07 – 2023-09-19 (×36): 100 mg via ORAL
  Filled 2023-09-07 (×36): qty 1

## 2023-09-07 MED ORDER — HYDROMORPHONE HCL 1 MG/ML IJ SOLN
0.5000 mg | INTRAMUSCULAR | Status: AC | PRN
  Administered 2023-09-07 (×3): 0.5 mg via INTRAVENOUS
  Filled 2023-09-07 (×3): qty 0.5

## 2023-09-07 NOTE — Progress Notes (Addendum)
 [name] is an 68 y.o. 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  06/2023 right-sided PCN  + ureteral stent. The patient went to [facility] for initial consult of CKD when she was found to have critical lab results and was advised to go to ER. Sodium level 117, potassium 6.6, CO2 10, BUN 169, creatinine level 10.15, anion gap 34, hemoglobin 9.  UA turbid with a chronic proteinuria  Assessment/Plan: # Acute kidney injury (BL cr 08-1 06/14/2023) concerning for obstructive uropathy and may have ischemic ATN with reduced intravascular volume.  Patient with history of bilateral hydronephrosis and has right nephrostomy tube.  Now with severe metabolic derangement with hyperkalemia, severe acidosis and uremic. CT AP atrophic LK, no hydro on rt (essentially operating with only the RK)   Started on  CRRT 3/20-3/21 initially low blood flow rate and low dialysate flow rate because of very high BUN and hyponatremia. D5W off 3/21 ~2pm.   Still w/ large output through ostomy (3L /24hrs),agree with isotonic fluids. Continue to hold RRT. Will follow closely with you.   # Severe hyponatremia, subacute to chronic: Rate of correction at goal and had increased to 130 but downtrended to 128.  Will add sodium tab tablet twice a day to try to avoid having to use hypertonic in the future.  Still getting LR 141ml/hr which she needs given the high ostomy output.   Will also check a urine osm and urine lytes.   Mental status much improved.   # Severe hyperkalemia resolved   # Anion gap metabolic acidosis with AKI  resolved   # History of obstructive uropathy, bilateral hydronephrosis but currently only operating with the RK no hydro RK on CT scan of abdomen pelvis  Subjective: Mental status improved and conversing appropriately; complaining of pain in right neck but knows not to pull the catheter.  Off Levo on LR 156ml/hr. Denies nausea vomiting, dizziness or headaches, shortness of breath Still large volume stool output through ostomy.   Chemistry and CBC: Creatinine, Ser  Date/Time Value Ref Range Status  09/07/2023 04:09 AM 3.13 (H) 0.44 - 1.00 mg/dL Final  69/62/9528 41:32 PM 3.11 (H) 0.44 - 1.00 mg/dL Final  44/06/270 53:66 AM 2.91 (H) 0.44 - 1.00 mg/dL Final  44/08/4740 59:56 AM 2.88 (H) 0.44 - 1.00 mg/dL Final  38/75/6433 29:51 PM 2.63 (H) 0.44 - 1.00 mg/dL Final    Comment:    DELTA CHECK NOTED  09/05/2023 05:00 PM 2.38 (H) 0.44 - 1.00 mg/dL Final  88/41/6606 30:16 PM 2.55 (H) 0.44 - 1.00 mg/dL Final    Comment:    DELTA CHECK NOTED  09/05/2023 08:22 AM 2.95 (H) 0.44 - 1.00 mg/dL Final    Comment:    DELTA CHECK NOTED  09/05/2023 03:07 AM 3.32 (H) 0.44 - 1.00 mg/dL Final    Comment:    DELTA CHECK NOTED  09/04/2023 04:26 PM 5.05 (H) 0.44 - 1.00 mg/dL Final    Comment:    DELTA CHECK NOTED  09/04/2023 01:32 PM 5.60 (H) 0.44 - 1.00 mg/dL Final  06/25/3233 57:32 AM 6.12 (H) 0.44 - 1.00 mg/dL Final  20/25/4270 62:37 AM 6.97 (H) 0.44 -  1.00 mg/dL Final  40/98/1191 47:82 AM 7.69 (H) 0.44 - 1.00 mg/dL Final  95/62/1308 65:78 AM 10.11 (H) 0.44 - 1.00 mg/dL Final  46/96/2952 84:13 PM 10.34 (H) 0.44 - 1.00 mg/dL Final  24/40/1027 25:36 PM 10.15 (H) 0.44 - 1.00 mg/dL Final  64/40/3474 25:95 AM 1.22 (H) 0.44 - 1.00 mg/dL Final  63/87/5643 32:95 AM 1.20 (H) 0.44 - 1.00 mg/dL Final  18/84/1660 63:01 AM 0.93 0.44 - 1.00 mg/dL Final  60/03/9322 55:73 AM 0.95 0.44 - 1.00 mg/dL Final  22/07/5425 06:23 AM 0.92 0.44 - 1.00 mg/dL Final  76/28/3151 76:16 AM 1.23 (H) 0.44 - 1.00 mg/dL Final   07/37/1062 69:48 AM 1.24 (H) 0.44 - 1.00 mg/dL Final  54/62/7035 00:93 AM 1.21 (H) 0.44 - 1.00 mg/dL Final  81/82/9937 16:96 AM 0.97 0.44 - 1.00 mg/dL Final  78/93/8101 75:10 AM 0.82 0.44 - 1.00 mg/dL Final  25/85/2778 24:23 AM 0.77 0.44 - 1.00 mg/dL Final  53/61/4431 54:00 AM 0.88 0.44 - 1.00 mg/dL Final  86/76/1950 93:26 AM 0.94 0.44 - 1.00 mg/dL Final  71/24/5809 98:33 AM 1.26 (H) 0.44 - 1.00 mg/dL Final  82/50/5397 67:34 PM 1.44 (H) 0.44 - 1.00 mg/dL Final  19/37/9024 09:73 AM 1.81 (H) 0.44 - 1.00 mg/dL Final  53/29/9242 68:34 PM 2.07 (H) 0.44 - 1.00 mg/dL Final  19/62/2297 98:92 PM 2.44 (H) 0.44 - 1.00 mg/dL Final  11/94/1740 81:44 AM 1.38 (H) 0.44 - 1.00 mg/dL Final  81/85/6314 97:02 PM 1.42 (H) 0.44 - 1.00 mg/dL Final  63/78/5885 02:77 AM 0.95 0.44 - 1.00 mg/dL Final  41/28/7867 67:20 AM 1.15 (H) 0.44 - 1.00 mg/dL Final  94/70/9628 36:62 PM 1.32 (H) 0.44 - 1.00 mg/dL Final  94/76/5465 03:54 AM 1.08 (H) 0.44 - 1.00 mg/dL Final  65/68/1275 17:00 AM 1.23 (H) 0.44 - 1.00 mg/dL Final  17/49/4496 75:91 AM 1.48 (H) 0.44 - 1.00 mg/dL Final  63/84/6659 93:57 AM 1.58 (H) 0.44 - 1.00 mg/dL Final  01/77/9390 30:09 AM 1.05 (H) 0.57 - 1.00 mg/dL Final  23/30/0762 26:33 AM 1.11 (H) 0.57 - 1.00 mg/dL Final  35/45/6256 38:93 AM 1.10 (H) 0.57 - 1.00 mg/dL Final  73/42/8768 11:57 AM 1.55 (H) 0.57 - 1.00 mg/dL Final  26/20/3559 74:16 AM 1.12 (H) 0.57 - 1.00 mg/dL Final  38/45/3646 80:32 PM 1.01 (H) 0.44 - 1.00 mg/dL Final  06/09/8249 03:70 AM 1.26 (H) 0.57 - 1.00 mg/dL Final  48/88/9169 45:03 AM 1.06 (H) 0.57 - 1.00 mg/dL Final   Recent Labs  Lab 09/04/23 0001 09/04/23 0620 09/04/23 0800 09/04/23 1626 09/04/23 2000 09/05/23 0307 09/05/23 0822 09/05/23 1315 09/05/23 1700 09/05/23 2021 09/06/23 0356 09/06/23 1634 09/07/23 0409  NA 121* 122*   < > 123*  120*   < > 129* 124* 130* 130* 130* 129*  129* 128* 128*  K 5.5* 3.9   < > 3.8  --  3.5 3.6 3.4* 3.5 3.4* 3.3*  3.2* 3.6 3.1*  CL  73* 81*   < > 82*  --  89* 87* 92* 91* 91* 89*  88* 87* 87*  CO2 18* 18*   < > 22  --  25 26 25 27 27 27  28 26 28   GLUCOSE 83 137*   < > 307*  --  136* 249* 226* 83 126* 62*  61* 75 73  BUN 171* 135*   < > 75*  --  46* 36* 27* 25* 26* 28*  25* 30* 31*  CREATININE 10.11* 7.69*   < >  5.05*  --  3.32* 2.95* 2.55* 2.38* 2.63* 2.91*  2.88* 3.11* 3.13*  CALCIUM 8.5* 8.0*   < > 7.8*  --  8.3* 7.9* 8.3* 8.3* 8.6* 8.9  8.9 8.6* 8.5*  PHOS >30.0* 9.3*  --  5.3*  --  4.2  --   --   --   --  2.8 2.0* 2.7   < > = values in this interval not displayed.   Recent Labs  Lab 09/03/23 2220 09/04/23 0001 09/05/23 0307 09/05/23 0454 09/05/23 0822 09/05/23 1315 09/05/23 1700 09/06/23 0356  WBC 11.9*   < > 6.6  --  8.7 9.8  --  11.6*  NEUTROABS 10.4*  --   --   --   --   --   --   --   HGB 15.2*   < > 7.2* 7.4* 7.0* 7.2* 7.4* 8.0*  HCT RESULTS UNAVAILABLE DUE TO INTERFERING SUBSTANCE   < > 19.4* 20.2* 19.0* 19.9* 20.2* 21.9*  MCV RESULTS UNAVAILABLE DUE TO INTERFERING SUBSTANCE   < > 91.5  --  93.1 93.9  --  93.6  PLT 334   < > 94* 104* 92* 106*  --  113*   < > = values in this interval not displayed.   Liver Function Tests: Recent Labs  Lab 09/03/23 1703 09/04/23 0620 09/06/23 0356 09/06/23 1634 09/07/23 0409  AST 30  --   --   --   --   ALT 54*  --   --   --   --   ALKPHOS 195*  --   --   --   --   BILITOT 0.8  --   --   --   --   PROT 8.0  --   --   --   --   ALBUMIN 4.3   < > 3.0* 2.7* 2.6*   < > = values in this interval not displayed.   No results for input(s): "LIPASE", "AMYLASE" in the last 168 hours. No results for input(s): "AMMONIA" in the last 168 hours. Cardiac Enzymes: No results for input(s): "CKTOTAL", "CKMB", "CKMBINDEX", "TROPONINI" in the last 168 hours. Iron Studies: No results for input(s): "IRON", "TIBC", "TRANSFERRIN", "FERRITIN" in the last 72 hours. PT/INR: @LABRCNTIP (inr:5)  Xrays/Other Studies: ) Results for orders placed or performed during the  hospital encounter of 09/03/23 (from the past 48 hours)  Glucose, capillary     Status: Abnormal   Collection Time: 09/05/23 11:03 AM  Result Value Ref Range   Glucose-Capillary 246 (H) 70 - 99 mg/dL    Comment: Glucose reference range applies only to samples taken after fasting for at least 8 hours.  Urine Culture (for pregnant, neutropenic or urologic patients or patients with an indwelling urinary catheter)     Status: Abnormal   Collection Time: 09/05/23  1:15 PM   Specimen: Urine, Catheterized  Result Value Ref Range   Specimen Description URINE, CATHETERIZED    Special Requests      NONE Performed at [facility], [address] [place], [place] [address]    Culture 50,000 COLONIES/mL YEAST (A)    Report Status 09/06/2023 FINAL   CBC     Status: Abnormal   Collection Time: 09/05/23  1:15 PM  Result Value Ref Range   WBC 9.8 4.0 - 10.5 K/uL   RBC 2.12 (L) 3.87 - 5.11 MIL/uL   Hemoglobin 7.2 (L) 12.0 - 15.0 g/dL   HCT 60.4 (L) 54.0 - 98.1 %  MCV 93.9 80.0 - 100.0 fL   MCH 34.0 26.0 - 34.0 pg   MCHC 36.2 (H) 30.0 - 36.0 g/dL   RDW 40.9 (H) 81.1 - 91.4 %   Platelets 106 (L) 150 - 400 K/uL    Comment: REPEATED TO VERIFY   nRBC 0.0 0.0 - 0.2 %    Comment: Performed at [facility], [address], [place], [place] [address]  Basic metabolic panel     Status: Abnormal   Collection Time: 09/05/23  1:15 PM  Result Value Ref Range   Sodium 130 (L) 135 - 145 mmol/L    Comment: DELTA CHECK NOTED   Potassium 3.4 (L) 3.5 - 5.1 mmol/L   Chloride 92 (L) 98 - 111 mmol/L   CO2 25 22 - 32 mmol/L   Glucose, Bld 226 (H) 70 - 99 mg/dL    Comment: Glucose reference range applies only to samples taken after fasting for at least 8 hours.   BUN 27 (H) 8 - 23 mg/dL   Creatinine, Ser 6.21 (H) 0.44 - 1.00 mg/dL    Comment: DELTA CHECK NOTED   Calcium 8.3 (L) 8.9 - 10.3 mg/dL   GFR, Estimated 20 (L) >60 mL/min    Comment: (NOTE) Calculated using the CKD-EPI Creatinine Equation  (2021)    Anion gap 13 5 - 15    Comment: Performed at [facility], [address] [place], [place] [address]  Urinalysis, Routine w reflex microscopic -Urine, Random     Status: Abnormal   Collection Time: 09/05/23  1:42 PM  Result Value Ref Range   Color, Urine YELLOW YELLOW   APPearance CLOUDY (A) CLEAR   Specific Gravity, Urine 1.015 1.005 - 1.030   pH 8.5 (H) 5.0 - 8.0   Glucose, UA NEGATIVE NEGATIVE mg/dL   Hgb urine dipstick SMALL (A) NEGATIVE   Bilirubin Urine NEGATIVE NEGATIVE   Ketones, ur NEGATIVE NEGATIVE mg/dL   Protein, ur 784 (A) NEGATIVE mg/dL   Nitrite NEGATIVE NEGATIVE   Leukocytes,Ua LARGE (A) NEGATIVE    Comment: Performed at [facility], [address], [place], [place] [address]  Urinalysis, Microscopic (reflex)     Status: Abnormal   Collection Time: 09/05/23  1:42 PM  Result Value Ref Range   RBC / HPF 6-10 0 - 5 RBC/hpf   WBC, UA 21-50 0 - 5 WBC/hpf   Bacteria, UA FEW (A) NONE SEEN   Squamous Epithelial / HPF 0-5 0 - 5 /HPF   Budding Yeast PRESENT     Comment: Performed at [facility], [address], [place], [place] [address]  Glucose, capillary     Status: Abnormal   Collection Time: 09/05/23  4:05 PM  Result Value Ref Range   Glucose-Capillary 181 (H) 70 - 99 mg/dL    Comment: Glucose reference range applies only to samples taken after fasting for at least 8 hours.  Hemoglobin and hematocrit, blood     Status: Abnormal   Collection Time: 09/05/23  5:00 PM  Result Value Ref Range   Hemoglobin 7.4 (L) 12.0 - 15.0 g/dL   HCT 32.4 (L) 40.1 - 02.7 %    Comment: Performed at [facility], [address] [place], [place] [address]  Basic metabolic panel     Status: Abnormal   Collection Time: 09/05/23  5:00 PM  Result Value Ref Range   Sodium 130 (L) 135 - 145 mmol/L   Potassium 3.5 3.5 -  5.1 mmol/L   Chloride 91 (L) 98 - 111 mmol/L   CO2 27 22 - 32 mmol/L   Glucose, Bld 83 70 - 99 mg/dL    Comment: Glucose reference  range applies only to samples taken after fasting for at least 8 hours.   BUN 25 (H) 8 - 23 mg/dL   Creatinine, Ser 2.95 (H) 0.44 - 1.00 mg/dL   Calcium 8.3 (L) 8.9 - 10.3 mg/dL   GFR, Estimated 22 (L) >60 mL/min    Comment: (NOTE) Calculated using the CKD-EPI Creatinine Equation (2021)    Anion gap 12 5 - 15    Comment: Performed at [facility], [address] [place], [place] [address]  Glucose, capillary     Status: None   Collection Time: 09/05/23  7:51 PM  Result Value Ref Range   Glucose-Capillary 95 70 - 99 mg/dL    Comment: Glucose reference range applies only to samples taken after fasting for at least 8 hours.  Basic metabolic panel     Status: Abnormal   Collection Time: 09/05/23  8:21 PM  Result Value Ref Range   Sodium 130 (L) 135 - 145 mmol/L    Comment: DELTA CHECK NOTED   Potassium 3.4 (L) 3.5 - 5.1 mmol/L   Chloride 91 (L) 98 - 111 mmol/L   CO2 27 22 - 32 mmol/L   Glucose, Bld 126 (H) 70 - 99 mg/dL    Comment: Glucose reference range applies only to samples taken after fasting for at least 8 hours.   BUN 26 (H) 8 - 23 mg/dL   Creatinine, Ser 8.65 (H) 0.44 - 1.00 mg/dL    Comment: DELTA CHECK NOTED   Calcium 8.6 (L) 8.9 - 10.3 mg/dL   GFR, Estimated 19 (L) >60 mL/min    Comment: (NOTE) Calculated using the CKD-EPI Creatinine Equation (2021)    Anion gap 12 5 - 15    Comment: Performed at [facility], [address], [place], [place] [address]  Glucose, capillary     Status: Abnormal   Collection Time: 09/05/23 11:17 PM  Result Value Ref Range   Glucose-Capillary 175 (H) 70 - 99 mg/dL    Comment: Glucose reference range applies only to samples taken after fasting for at least 8 hours.  Glucose, capillary     Status: Abnormal   Collection Time: 09/06/23 12:12 AM  Result Value Ref Range   Glucose-Capillary 155 (H) 70 - 99 mg/dL    Comment: Glucose reference range applies only to samples taken after fasting for at least 8 hours.  Glucose,  capillary     Status: Abnormal   Collection Time: 09/06/23  3:29 AM  Result Value Ref Range   Glucose-Capillary 156 (H) 70 - 99 mg/dL    Comment: Glucose reference range applies only to samples taken after fasting for at least 8 hours.  Basic metabolic panel     Status: Abnormal   Collection Time: 09/06/23  3:56 AM  Result Value Ref Range   Sodium 129 (L) 135 - 145 mmol/L   Potassium 3.3 (L) 3.5 - 5.1 mmol/L   Chloride 89 (L) 98 - 111 mmol/L   CO2 27 22 - 32 mmol/L   Glucose, Bld 62 (L) 70 - 99 mg/dL    Comment: Glucose reference range applies only to samples taken after fasting for at least 8 hours.   BUN 28 (H) 8 - 23 mg/dL   Creatinine, Ser 6.29 (H) 0.44 - 1.00  mg/dL   Calcium 8.9 8.9 - 16.1 mg/dL   GFR, Estimated 17 (L) >60 mL/min    Comment: (NOTE) Calculated using the CKD-EPI Creatinine Equation (2021)    Anion gap 13 5 - 15    Comment: Performed at [facility], [address], [place], [place] [address]  Renal function panel (daily at 0500)     Status: Abnormal   Collection Time: 09/06/23  3:56 AM  Result Value Ref Range   Sodium 129 (L) 135 - 145 mmol/L   Potassium 3.2 (L) 3.5 - 5.1 mmol/L   Chloride 88 (L) 98 - 111 mmol/L   CO2 28 22 - 32 mmol/L   Glucose, Bld 61 (L) 70 - 99 mg/dL    Comment: Glucose reference range applies only to samples taken after fasting for at least 8 hours.   BUN 25 (H) 8 - 23 mg/dL   Creatinine, Ser 5.40 (H) 0.44 - 1.00 mg/dL   Calcium 8.9 8.9 - 98.1 mg/dL   Phosphorus 2.8 2.5 - 4.6 mg/dL   Albumin 3.0 (L) 3.5 - 5.0 g/dL   GFR, Estimated 17 (L) >60 mL/min    Comment: (NOTE) Calculated using the CKD-EPI Creatinine Equation (2021)    Anion gap 13 5 - 15    Comment: Performed at [facility], [address], [place], [place] [address]  Magnesium     Status: None   Collection Time: 09/06/23  3:56 AM  Result Value Ref Range   Magnesium 2.0 1.7 - 2.4 mg/dL    Comment: Performed at [facility], [address]  [place], [place] [address]  CBC     Status: Abnormal   Collection Time: 09/06/23  3:56 AM  Result Value Ref Range   WBC 11.6 (H) 4.0 - 10.5 K/uL   RBC 2.34 (L) 3.87 - 5.11 MIL/uL   Hemoglobin 8.0 (L) 12.0 - 15.0 g/dL   HCT 21.3 (L) 08.6 - 57.8 %   MCV 93.6 80.0 - 100.0 fL   MCH 34.2 (H) 26.0 - 34.0 pg   MCHC 36.5 (H) 30.0 - 36.0 g/dL   RDW 46.9 (H) 62.9 - 52.8 %   Platelets 113 (L) 150 - 400 K/uL   nRBC 0.0 0.0 - 0.2 %    Comment: Performed at [facility], [address] [place], [place] [address]  Glucose, capillary     Status: Abnormal   Collection Time: 09/06/23  4:05 AM  Result Value Ref Range   Glucose-Capillary 57 (L) 70 - 99 mg/dL    Comment: Glucose reference range applies only to samples taken after fasting for at least 8 hours.  Glucose, capillary     Status: None   Collection Time: 09/06/23  4:24 AM  Result Value Ref Range   Glucose-Capillary 80 70 - 99 mg/dL    Comment: Glucose reference range applies only to samples taken after fasting for at least 8 hours.  Glucose, capillary     Status: Abnormal   Collection Time: 09/06/23  8:59 AM  Result Value Ref Range   Glucose-Capillary 551 (HH) 70 - 99 mg/dL    Comment: Glucose reference range applies only to samples taken after fasting for at least 8 hours.  Gastrointestinal Panel by PCR , Stool     Status: None   Collection Time: 09/06/23  9:26 AM   Specimen: Ileostomy; Stool  Result Value Ref Range   Campylobacter species NOT DETECTED NOT DETECTED   Plesimonas shigelloides  NOT DETECTED NOT DETECTED   Salmonella species NOT DETECTED NOT DETECTED   Yersinia enterocolitica NOT DETECTED NOT DETECTED   Vibrio species NOT DETECTED NOT DETECTED   Vibrio cholerae NOT DETECTED NOT DETECTED   Enteroaggregative E coli (EAEC) NOT DETECTED NOT DETECTED   Enteropathogenic E coli (EPEC) NOT DETECTED NOT DETECTED   Enterotoxigenic E coli (ETEC) NOT DETECTED NOT DETECTED   Shiga like toxin producing E coli (STEC) NOT DETECTED NOT  DETECTED   Shigella/Enteroinvasive E coli (EIEC) NOT DETECTED NOT DETECTED   Cryptosporidium NOT DETECTED NOT DETECTED   Cyclospora cayetanensis NOT DETECTED NOT DETECTED   Entamoeba histolytica NOT DETECTED NOT DETECTED   Giardia lamblia NOT DETECTED NOT DETECTED   Adenovirus F40/41 NOT DETECTED NOT DETECTED   Astrovirus NOT DETECTED NOT DETECTED   Norovirus GI/GII NOT DETECTED NOT DETECTED   Rotavirus A NOT DETECTED NOT DETECTED   Sapovirus (I, II, IV, and V) NOT DETECTED NOT DETECTED    Comment: Performed at [facility], [address] [place], [place] [address]  Glucose, capillary     Status: Abnormal   Collection Time: 09/06/23 10:00 AM  Result Value Ref Range   Glucose-Capillary 330 (H) 70 - 99 mg/dL    Comment: Glucose reference range applies only to samples taken after fasting for at least 8 hours.  Glucose, capillary     Status: Abnormal   Collection Time: 09/06/23 10:12 AM  Result Value Ref Range   Glucose-Capillary 320 (H) 70 - 99 mg/dL    Comment: Glucose reference range applies only to samples taken after fasting for at least 8 hours.  Glucose, capillary     Status: Abnormal   Collection Time: 09/06/23 12:30 PM  Result Value Ref Range   Glucose-Capillary 261 (H) 70 - 99 mg/dL    Comment: Glucose reference range applies only to samples taken after fasting for at least 8 hours.  Glucose, capillary     Status: None   Collection Time: 09/06/23  3:58 PM  Result Value Ref Range   Glucose-Capillary 96 70 - 99 mg/dL    Comment: Glucose reference range applies only to samples taken after fasting for at least 8 hours.  Renal function panel (daily at 1600)     Status: Abnormal   Collection Time: 09/06/23  4:34 PM  Result Value Ref Range   Sodium 128 (L) 135 - 145 mmol/L   Potassium 3.6 3.5 - 5.1 mmol/L   Chloride 87 (L) 98 - 111 mmol/L   CO2 26 22 - 32 mmol/L   Glucose, Bld 75 70 - 99 mg/dL    Comment: Glucose reference range applies only to samples taken after  fasting for at least 8 hours.   BUN 30 (H) 8 - 23 mg/dL   Creatinine, Ser 9.14 (H) 0.44 - 1.00 mg/dL   Calcium 8.6 (L) 8.9 - 10.3 mg/dL   Phosphorus 2.0 (L) 2.5 - 4.6 mg/dL   Albumin 2.7 (L) 3.5 - 5.0 g/dL   GFR, Estimated 16 (L) >60 mL/min    Comment: (NOTE) Calculated using the CKD-EPI Creatinine Equation (2021)    Anion gap 15 5 - 15    Comment: Performed at [facility], [address], [place], [place] [address]  Glucose, capillary     Status: None   Collection Time: 09/06/23  7:36 PM  Result Value Ref Range   Glucose-Capillary 84 70 - 99 mg/dL    Comment: Glucose reference range applies only to samples taken after  fasting for at least 8 hours.  Glucose, capillary     Status: None   Collection Time: 09/06/23  8:37 PM  Result Value Ref Range   Glucose-Capillary 84 70 - 99 mg/dL    Comment: Glucose reference range applies only to samples taken after fasting for at least 8 hours.  Glucose, capillary     Status: None   Collection Time: 09/06/23 11:38 PM  Result Value Ref Range   Glucose-Capillary 99 70 - 99 mg/dL    Comment: Glucose reference range applies only to samples taken after fasting for at least 8 hours.  Glucose, capillary     Status: Abnormal   Collection Time: 09/07/23  3:32 AM  Result Value Ref Range   Glucose-Capillary 60 (L) 70 - 99 mg/dL    Comment: Glucose reference range applies only to samples taken after fasting for at least 8 hours.  Renal function panel (daily at 0500)     Status: Abnormal   Collection Time: 09/07/23  4:09 AM  Result Value Ref Range   Sodium 128 (L) 135 - 145 mmol/L   Potassium 3.1 (L) 3.5 - 5.1 mmol/L   Chloride 87 (L) 98 - 111 mmol/L   CO2 28 22 - 32 mmol/L   Glucose, Bld 73 70 - 99 mg/dL    Comment: Glucose reference range applies only to samples taken after fasting for at least 8 hours.   BUN 31 (H) 8 - 23 mg/dL   Creatinine, Ser 6.57 (H) 0.44 - 1.00 mg/dL   Calcium 8.5 (L) 8.9 - 10.3 mg/dL   Phosphorus 2.7 2.5 - 4.6 mg/dL    Albumin 2.6 (L) 3.5 - 5.0 g/dL   GFR, Estimated 16 (L) >60 mL/min    Comment: (NOTE) Calculated using the CKD-EPI Creatinine Equation (2021)    Anion gap 13 5 - 15    Comment: Performed at [facility], [address], [place], [place] [address]  Magnesium     Status: Abnormal   Collection Time: 09/07/23  4:09 AM  Result Value Ref Range   Magnesium 1.4 (L) 1.7 - 2.4 mg/dL    Comment: Performed at [facility], [address] [place], [place] [address]  Glucose, capillary     Status: Abnormal   Collection Time: 09/07/23  4:13 AM  Result Value Ref Range   Glucose-Capillary 65 (L) 70 - 99 mg/dL    Comment: Glucose reference range applies only to samples taken after fasting for at least 8 hours.  Glucose, capillary     Status: Abnormal   Collection Time: 09/07/23  4:30 AM  Result Value Ref Range   Glucose-Capillary 244 (H) 70 - 99 mg/dL    Comment: Glucose reference range applies only to samples taken after fasting for at least 8 hours.  Glucose, capillary     Status: None   Collection Time: 09/07/23  8:32 AM  Result Value Ref Range   Glucose-Capillary 97 70 - 99 mg/dL    Comment: Glucose reference range applies only to samples taken after fasting for at least 8 hours.   No results found.   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   Thyroid disease    Vertigo     PSH:   Past Surgical History:  Procedure Laterality Date   ABDOMINAL ADHESION SURGERY  11/08/2020   Dr [name] - [facility]   ABDOMINAL HYSTERECTOMY  2003   [facility] - Dr [name] - for cervical cancer   BILE DUCT EXPLORATION  10/22/2003   CHOLECYSTECTOMY OPEN  10/22/2003   Dr Zachery Dakins - CBD repair w T tube   CYSTOSCOPY  2021   ILEOSTOMY  11/23/2018   ILEOSTOMY CLOSURE  04/2020   IR PATIENT EVAL TECH 0-60 MINS  06/20/2023   LOW ANTERIOR BOWEL  RESECTION  11/23/2018   LAR/takedown colovaginal fistula/loop ileostomy  - [facility]    Allergies:  Allergies  Allergen Reactions   Barium-Containing Compounds     hives   Percocet [Oxycodone-Acetaminophen] Itching and Other (See Comments)    migraine     Medications:   Prior to Admission medications   Medication Sig Start Date End Date Taking? Authorizing Provider  acetaminophen (TYLENOL) 500 MG tablet Take 1,000 mg by mouth every 6 (six) hours as needed.   Yes [provider]  amoxicillin (AMOXIL) 500 MG capsule Take 500 mg by mouth 3 (three) times daily. 08/27/23  Yes [provider]  cetirizine (ZYRTEC) 10 MG tablet Take 10 mg by mouth daily as needed for allergies or rhinitis.   Yes [provider]  cholestyramine light (PREVALITE) 4 g packet Take 4 g by mouth 2 (two) times daily. 08/08/23 11/06/23 Yes [provider]  dronabinol (MARINOL) 2.5 MG capsule Take 2.5 mg by mouth 2 (two) times daily as needed (Appetite).   Yes [provider]  fluticasone (FLONASE) 50 MCG/ACT nasal spray Place 1 spray into both nostrils daily as needed for allergies or rhinitis. 08/30/22  Yes [provider]  levothyroxine (SYNTHROID) 25 MCG tablet Take 1 tablet (25 mcg total) by mouth daily. 07/17/22  Yes Delynn Flavin M, DO  loperamide (IMODIUM) 2 MG capsule Take 2 mg by mouth 3 (three) times daily as needed for diarrhea or loose stools. 09/03/23  Yes [provider]  mirtazapine (REMERON) 7.5 MG tablet Take 7.5 mg by mouth at bedtime as needed (Anxiety/appetite).   Yes [provider]  Multiple Vitamins-Minerals (MULTIVITAMIN) tablet Take 1 tablet by mouth daily.   Yes [provider]  omeprazole (PRILOSEC) 20 MG capsule Take 20 mg by mouth daily as needed (Acid reflux). 05/21/23  Yes [provider]  ondansetron (ZOFRAN) 4 MG tablet Take 4 mg by mouth every 6 (six) hours as needed for nausea or vomiting.   Yes [provider]  promethazine (PHENERGAN) 12.5 MG tablet Take 12.5 mg by mouth every 6 (six) hours as needed for nausea or vomiting.   Yes [provider]  diphenoxylate-atropine (LOMOTIL) 2.5-0.025 MG tablet Take 2 tablets by mouth 3 (three) times daily as needed for diarrhea or loose stools. Patient not taking: Reported on 09/04/2023    [provider]  fluconazole (DIFLUCAN) 150 MG tablet Take 150 mg by mouth daily. For 7 days Patient not taking: Reported on 09/04/2023 08/11/23   [provider]  simvastatin (ZOCOR) 20 MG tablet Take 20 mg by mouth at bedtime. Patient not taking: Reported on 09/04/2023    [provider]    Discontinued Meds:   Medications Discontinued During This Encounter  Medication Reason   0.9 %  sodium chloride infusion    cefTRIAXone (ROCEPHIN) 1 g in sodium chloride 0.9 % 100 mL IVPB    norepinephrine (LEVOPHED) 4mg  in (0.016 mg/mL) premix infusion Entry Error   sodium chloride  1 g tablet Completed Course   sertraline (ZOLOFT) 25 MG tablet Discontinued by provider   norepinephrine (LEVOPHED) 4mg  in (0.016 mg/mL) premix infusion    Amino Acid Infusion in D15W (CLINIMIX/DEXTROSE, 5/15, IV) Discontinued by provider   Fat Emulsion Plant Based (FAT EMULSION 20 %, TPN/TNA, FOR ANES DOCUMENTATION) 20 % EMUL Discontinued by provider   divalproex (DEPAKOTE) 125 MG DR tablet Discontinued by provider   Lactobacillus Rhamnosus, GG, (CULTURELLE HEALTH & WELLNESS) CAPS Discontinued by provider   mupirocin ointment (BACTROBAN) 2 % Patient Preference   promethazine (PHENERGAN) 12.5 MG tablet Patient Preference   polyethylene glycol powder (GLYCOLAX/MIRALAX) 17 GM/SCOOP powder Patient Preference   rizatriptan (MAXALT-MLT) 10 MG disintegrating tablet Patient Preference   hydrOXYzine (ATARAX) 25 MG tablet Patient Preference   Oyster Shell Calcium 500 MG TABS Patient Preference   Multiple Vitamins-Minerals (ONCOVITE) TABS Patient  Preference   sodium zirconium cyclosilicate (LOKELMA) packet 5 g    prismasol BGK 0/2.5 infusion    prismasol BGK 0/2.5 infusion    dextrose 5 % solution    insulin aspart (novoLOG) injection 0-6 Units    fluconazole (DIFLUCAN) IVPB 400 mg    sodium bicarbonate 150 mEq in sterile water 1,150 mL infusion    insulin aspart (novoLOG) injection 0-9 Units    prismasol BGK 4/2.5 infusion    prismasol BGK 4/2.5 infusion    prismasol BGK 4/2.5 infusion    vancomycin (VANCOREADY) IVPB 500 mg/100 mL    dexmedetomidine (PRECEDEX) 400 MCG/100ML (4 mcg/mL) infusion    piperacillin-tazobactam (ZOSYN) IVPB 3.375 g    insulin glargine (LANTUS) injection 5 Units    insulin glargine (LANTUS) injection 10 Units     Social History:  reports that she has quit smoking. Her smoking use included cigarettes. She has never used smokeless tobacco. She reports that she does not currently use alcohol. She reports that she does not use drugs.  Family History:   Family History  Problem Relation Age of Onset   Heart disease Father    Diabetes Father    Parkinson's disease Neg Hx     Blood pressure (!) 85/52, pulse 81, temperature 98 F (36.7 C), temperature source Oral, resp. rate 10, height 5\' 2"  (1.575 m), weight 38 kg, SpO2 100%. Physical Exam: General exam: Status markedly improved.  Able to converse appropriately, cachectic, not in distress Respiratory system: Clear to auscultation. Respiratory effort normal. No wheezing or crackle Cardiovascular system: S1 & S2 heard, RRR.  No pedal edema. Gastrointestinal system: Abdomen is nondistended, ileostomy bag with loose stool connected to drainage. Central nervous system: Tremors present Extremities: No edema. Skin: No rashes Access: RIJ temp (3/19)     Ethelene Hal, MD 09/07/2023, 8:51 AM

## 2023-09-07 NOTE — TOC Initial Note (Signed)
 Transition of Care [facility]) - Initial/Assessment Note   Patient Details  Name: [name] MRN: [id number] Date of Birth: December 22, 1955  [facility]) CM/SW Contact:    Helene Kelp, LCSW Phone Number: 09/07/2023, 11:28 AM  Clinical Narrative:                 CSW followed-up disposition recommendations (SNF placement).  CSW completed initial TOC work-up/assessment as  CSW spoke with the daughter Sue Lush [phone]) and she expressed the patient went to a previous SNF and they were dissatisfied. The daughter and spouse are aligned to the patient going to another facility. The only SNF they may consider [facility] in [place]. They want the referral to go their, and at least they can see of [facility] can take a look at the referral. The daughter stated she would talk with the father (pt spouse) and see if they would be open to a SNF and or otherwise. The Dg stated it was not best to talk about the patient's disposition with the spouse in front of the patient, because it will/may upset the patient.   The CSW attempted to make contact with the spouse Yianna Tersigni [phone]) but was unable to reach him.    No other needs identified by this Clinical research associate currently. Patient needs and current disposition to be followed by    Expected Discharge Plan: Skilled Nursing Facility Barriers to Discharge: Continued Medical Work up   Patient Goals and CMS Choice Patient states their goals for this hospitalization and ongoing recovery are:: To get better   Choice offered to / list presented to : Patient [place] ownership interest in [facility].provided to:: Spouse    Expected Discharge Plan and Services       Living arrangements for the past 2 months: Single Family Home                                      Prior Living Arrangements/Services Living arrangements for the past 2 months: Single Family Home Lives with:: Spouse   Do you feel safe going  back to the place where you live?: Yes      Need for Family Participation in Patient Care: Yes (Comment) Care giver support system in place?: Yes (comment)   Criminal Activity/Legal Involvement Pertinent to Current Situation/Hospitalization: No - Comment as needed  Activities of Daily Living   ADL Screening (condition at time of admission) Independently performs ADLs?: No Does the patient have a NEW difficulty with bathing/dressing/toileting/self-feeding that is expected to last >3 days?: Yes (Initiates electronic notice to provider for possible OT consult) Does the patient have a NEW difficulty with getting in/out of bed, walking, or climbing stairs that is expected to last >3 days?: Yes (Initiates electronic notice to provider for possible PT consult) Does the patient have a NEW difficulty with communication that is expected to last >3 days?: Yes (Initiates electronic notice to provider for possible SLP consult) Is the patient deaf or have difficulty hearing?: No Does the patient have difficulty seeing, even when wearing glasses/contacts?: No Does the patient have difficulty concentrating, remembering, or making decisions?: Yes  Permission Sought/Granted Permission sought to share information with : Case Manager, Magazine features editor, Family Supports Permission granted to share information with : Yes, Release of Information Signed  Share Information with NAME: Allahna Husband     Permission granted to share info w  Relationship: Spouse     Emotional Assessment       Orientation: : Oriented to Self Alcohol / Substance Use: Not Applicable Psych Involvement: No (comment)  Admission diagnosis:  Hyperkalemia [E87.5] Hyponatremia [E87.1] AKI (acute kidney injury) (HCC) [N17.9] Acute kidney injury (HCC) [N17.9] Patient Active Problem List   Diagnosis Date Noted   Protein-calorie malnutrition, severe 09/05/2023   Hyperkalemia 09/04/2023   Acute kidney injury (HCC)  09/03/2023   Tremors of nervous system 06/10/2023   Hyponatremia 06/09/2023   Failure to thrive in adult 06/09/2023   Nausea & vomiting 06/09/2023   Transaminitis 06/09/2023   Metabolic acidosis with normal anion gap and bicarbonate losses 06/09/2023   Bilateral hydronephrosis 06/09/2023   Subacute confusional state 11/24/2022   Memory loss 11/24/2022   Hypokalemia 11/19/2022   Renal atrophy, left 11/19/2022   Acute-on-chronic kidney injury (HCC) 11/18/2022   SBO (small bowel obstruction) (HCC) 11/17/2022   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   Seborrheic dermatitis 11/04/2022   Senile purpura (HCC) 11/04/2022   Telogen effluvium 11/04/2022   Neck pain 05/07/2022   Right ear pain 05/07/2022   Tympanic membrane perforation, marginal, right 02/27/2022   CKD (chronic kidney disease) stage 3, GFR 30-59 ml/min (HCC) 05/24/2021   Dysuria 04/16/2021   Vaginal irritation 04/16/2021   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   Imbalance 04/10/2021   Tinnitus aurium, right 04/10/2021   Non-recurrent acute serous otitis media of right ear 12/13/2020   Unspecified intestinal obstruction, unspecified as to partial versus complete obstruction (HCC) 10/27/2020   Constipation 10/25/2020   Nausea 10/25/2020   Hypothyroidism 06/24/2018   Smoker 06/24/2018   History of cervical cancer 06/24/2018   Mixed hyperlipidemia 06/24/2018   Essential tremor 05/08/2018   Chronic daily headache 05/06/2018   Lumbar spine painful on movement 05/06/2018   Vertigo 05/06/2018   Leg cramps 05/06/2018   Allergic rhinitis 04/09/2017   Anxiety 04/09/2017   Need for immunization against influenza 04/09/2017   Annual physical exam 04/09/2017   PCP:  Patient, [name] Pharmacy:   [facility], [place] - [address] [place] [address] Phone: [phone] Fax: [phone]  [facility] [address], [place] - [place] [id number] [place] [address] Phone: [phone] Fax: [phone]     Social Drivers of Health (SDOH) Social History: SDOH Screenings   Food Insecurity: No Food Insecurity (09/04/2023)  Housing: Low Risk  (09/04/2023)  Transportation Needs: No Transportation Needs (09/04/2023)  Utilities: Not At Risk (09/04/2023)  Depression (PHQ2-9): Low Risk  (12/06/2022)  Financial Resource Strain: Low Risk  (08/11/2023)   Received from [facility]  Social Connections: Moderately Isolated (09/04/2023)  Stress: No Stress Concern Present (11/06/2022)  Tobacco Use: Medium Risk (09/04/2023)   SDOH Interventions:     Readmission Risk Interventions    06/24/2023    2:33 PM 11/21/2022   10:10 AM 11/18/2022    2:11 PM  Readmission Risk Prevention Plan  Post Dischage Appt   Complete  Medication Screening   Complete  Transportation Screening Complete Complete Complete  PCP or Specialist Appt within 3-5 Days Complete    HRI or Home Care Consult Complete Complete   Social Work Consult for Recovery Care Planning/Counseling Complete Complete   Palliative Care Screening Not Applicable Not Applicable   Medication Review Oceanographer) Complete Complete

## 2023-09-07 NOTE — [place] FL2 (Signed)
 [place] MEDICAID FL2 LEVEL OF CARE FORM     IDENTIFICATION  Patient Name: [name] Birthdate: July 27, 1955 Sex: female Admission Date (Current Location): 09/03/2023  [facility] and [place] Number:      Facility and Address:  [facility], [address], [place], [place] [address]      Provider Number: [id number]  Attending Physician Name and Address:  Steffanie Dunn, DO  Relative Name and Phone Number:  Spouse Samariyah Cowles [phone]    Current Level of Care: Hospital Recommended Level of Care: Skilled Nursing Facility Prior Approval Number:    Date Approved/Denied: 09/07/23 PASRR Number: [id number]  Discharge Plan: SNF    Current Diagnoses: Patient Active Problem List   Diagnosis Date Noted   Protein-calorie malnutrition, severe 09/05/2023   Hyperkalemia 09/04/2023   Acute kidney injury (HCC) 09/03/2023   Tremors of nervous system 06/10/2023   Hyponatremia 06/09/2023   Failure to thrive in adult 06/09/2023   Nausea & vomiting 06/09/2023   Transaminitis 06/09/2023   Metabolic acidosis with normal anion gap and bicarbonate losses 06/09/2023   Bilateral hydronephrosis 06/09/2023   Subacute confusional state 11/24/2022   Memory loss 11/24/2022   Hypokalemia 11/19/2022   Renal atrophy, left 11/19/2022   Acute-on-chronic kidney injury (HCC) 11/18/2022   SBO (small bowel obstruction) (HCC) 11/17/2022   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   Seborrheic dermatitis 11/04/2022   Senile purpura (HCC) 11/04/2022   Telogen effluvium 11/04/2022   Neck pain 05/07/2022   Right ear pain 05/07/2022   Tympanic membrane perforation, marginal, right 02/27/2022   CKD (chronic kidney disease) stage 3, GFR 30-59 ml/min (HCC) 05/24/2021   Dysuria 04/16/2021   Vaginal irritation 04/16/2021   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   Imbalance 04/10/2021   Tinnitus aurium, right 04/10/2021   Non-recurrent acute serous otitis media of  right ear 12/13/2020   Unspecified intestinal obstruction, unspecified as to partial versus complete obstruction (HCC) 10/27/2020   Constipation 10/25/2020   Nausea 10/25/2020   Hypothyroidism 06/24/2018   Smoker 06/24/2018   History of cervical cancer 06/24/2018   Mixed hyperlipidemia 06/24/2018   Essential tremor 05/08/2018   Chronic daily headache 05/06/2018   Lumbar spine painful on movement 05/06/2018   Vertigo 05/06/2018   Leg cramps 05/06/2018   Allergic rhinitis 04/09/2017   Anxiety 04/09/2017   Need for immunization against influenza 04/09/2017   Annual physical exam 04/09/2017    Orientation RESPIRATION BLADDER Height & Weight     Self  Normal Incontinent Weight: 83 lb 12.4 oz (38 kg) Height:  5\' 2"  (157.5 cm)  BEHAVIORAL SYMPTOMS/MOOD NEUROLOGICAL BOWEL NUTRITION STATUS      Incontinent Diet  AMBULATORY STATUS COMMUNICATION OF NEEDS Skin   Limited Assist Verbally                         Personal Care Assistance Level of Assistance  Dressing     Dressing Assistance: Limited assistance     Functional Limitations Info             SPECIAL CARE FACTORS FREQUENCY  PT (By licensed PT), OT (By licensed OT)     PT Frequency: 5x OT Frequency: 3x            Contractures Contractures Info: Not present    Additional Factors Info  Code Status, Allergies Code Status Info: FUll Allergies Info: Barium-containing Compounds, Percocet (Oxycodone-acetaminophen)           Current Medications (  09/07/2023):  This is the current hospital active medication list Current Facility-Administered Medications  Medication Dose Route Frequency Provider Last Rate Last Admin   acetaminophen (TYLENOL) tablet 650 mg  650 mg Oral Q6H PRN Carilyn Goodpasture, MD   650 mg at 09/07/23 1610   Chlorhexidine Gluconate Cloth 2 % PADS 6 each  6 each Topical Daily Henrene Hawking, NP   6 each at 09/06/23 1107   clotrimazole (GYNE-LOTRIMIN) vaginal cream 1 Applicatorful  1 Applicatorful  Vaginal QHS Cheri Fowler, MD   1 Applicatorful at 09/06/23 1556   dextrose 50 % solution            docusate sodium (COLACE) capsule 100 mg  100 mg Oral BID PRN Henrene Hawking, NP       feeding supplement (ENSURE ENLIVE / ENSURE PLUS) liquid 237 mL  237 mL Oral TID BM Cheri Fowler, MD   237 mL at 09/07/23 0912   folic acid (FOLVITE) tablet 1 mg  1 mg Oral Daily Cheri Fowler, MD   1 mg at 09/07/23 0913   gabapentin (NEURONTIN) capsule 100 mg  100 mg Oral Q8H Karie Fetch P, DO       heparin injection 1,000-6,000 Units  1,000-6,000 Units CRRT PRN Maxie Barb, MD   2,400 Units at 09/05/23 1555   heparin injection 5,000 Units  5,000 Units Subcutaneous Q8H Rochel Brome C, NP   5,000 Units at 09/07/23 0604   HYDROmorphone (DILAUDID) injection 0.5 mg  0.5 mg Intravenous Q4H PRN Paliwal, Aditya, MD   0.5 mg at 09/07/23 0400   insulin aspart (novoLOG) injection 0-15 Units  0-15 Units Subcutaneous Q4H Gleason, Darcella Gasman, PA-C   8 Units at 09/06/23 1255   insulin glargine (LANTUS) injection 5 Units  5 Units Subcutaneous QHS Karie Fetch P, DO       levothyroxine (SYNTHROID) tablet 25 mcg  25 mcg Oral Q0600 Henrene Hawking, NP   25 mcg at 09/07/23 0606   loperamide (IMODIUM) capsule 2 mg  2 mg Oral Q6H Cheri Fowler, MD   2 mg at 09/07/23 9604   multivitamin with minerals tablet 1 tablet  1 tablet Oral Daily Cheri Fowler, MD   1 tablet at 09/07/23 0912   norepinephrine (LEVOPHED) 4mg  in (0.016 mg/mL) premix infusion  0-40 mcg/min Intravenous Titrated Gleason, Darcella Gasman, PA-C 3.75 mL/hr at 09/07/23 1000 1 mcg/min at 09/07/23 1000   ondansetron (ZOFRAN) injection 4 mg  4 mg Intravenous Q6H PRN Henrene Hawking, NP   4 mg at 09/06/23 1356   piperacillin-tazobactam (ZOSYN) IVPB 2.25 g  2.25 g Intravenous Q8H Carney, Gwenlyn Found, RPH   Stopped at 09/07/23 5409   polyethylene glycol (MIRALAX / GLYCOLAX) packet 17 g  17 g Oral Daily PRN Henrene Hawking, NP       sodium chloride flush (NS) 0.9 %  injection 10-40 mL  10-40 mL Intracatheter Q12H Rochel Brome C, NP   10 mL at 09/07/23 1034   sodium chloride flush (NS) 0.9 % injection 10-40 mL  10-40 mL Intracatheter PRN Henrene Hawking, NP       sodium chloride tablet 1 g  1 g Oral BID WC Ethelene Hal, MD       thiamine (VITAMIN B1) injection 100 mg  100 mg Intravenous Daily Cheri Fowler, MD   100 mg at 09/07/23 8119     Discharge Medications: Please see discharge summary for a list of discharge medications.  Relevant Imaging Results:  Relevant Lab Results:   Additional Information SSN:[id number]  Helene Kelp, LCSW

## 2023-09-07 NOTE — Progress Notes (Signed)
 NAME:  [name], MRN:  [id number], DOB:  1955-09-25, LOS: 4 ADMISSION DATE:  09/03/2023, CONSULTATION DATE: 09/03/23  REFERRING MD:  ED MD Rubin Payor  CHIEF COMPLAINT:  Abnormal Lab work   History of Present Illness:  Pt is a 68 yr female with a significant past medical history for cervical cancer 2003 s/p hysterectomy, hx of TPN use/malnutrition, right hydronephrosis s/p uretal stent 03/2023 and R nephrostomy tube placed in 04/2023 due to hospitalization at [facility] for obstructive uropathy AKI and BL hydronephrosis complicated by Enterobacter and Candida UTI,  rectovaginal s/p anterior resection with colovaginal fistula repair, loop ileostomy with closure in 2021, SBO s/p with lysis of adhesions, recurrent SBO, ileum/cecum resection with ileostomy creation 2024, and recent [facility] hospitalization (2/14-2/23/25) which revealed patient had severe dehydration in the setting of high output and poor oral intake who presents to [facility] ED from [facility] due to abnormal lab work indicating acute renal failure (Cr >10, BUN >169, Anion Gap acidosis, severe hyperkalemia-k>6, and hyponatremia Na 117. Due to severity of acute kidney failure and acidosis, PCCM was consulted for ICU admission and further management along with nephrology consult.  Upon initial assessment, patient sitting up in ED stretcher with husband at beside. Patient with intermittent confusion, and disoriented to situation/time. Therefore husband able to provide and assist with HPI. According to husband, had initial appointment with MD Sandford with [facility] and when lab work came back, husband was advised to take patient to [facility] ED immediately. Per husband, over the last few weeks-patient has had issues with sleeping, more fatigued, and having poor oral intake. Husband endorses that ileostomy and right side nephrostomy tube have had moderate output. Patient states that she feels nauseous and sick on her stomach but no vomiting  currently. Husband denies patient having any chills/fevers, and or sick contacts. Patient tachycardic but normotensive, on room air, O2 sats 99 to 100%,  and malnourished.   Pertinent  Medical History   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   Thyroid disease    Vertigo    Malnutrition   Significant Hospital Events: Including procedures, antibiotic start and stop dates in addition to other pertinent events   3/20 Admit to ICU with Acute Renal Failure with concerns with uretal obstruction  3/21 cont CRRT  Interim History / Subjective:  Overnight no acute events. Having pain all over with being touched-- gets vagal episodes with this better with dilaudid. Complains of general malaise Afebrile overnight.  Continues to have high ostomy output.  Holding CRRT for now.   Objective   Blood pressure (!) 85/52, pulse 81, temperature 98 F (36.7 C), temperature source Oral, resp. rate 10, height 5\' 2"  (1.575 m), weight 38 kg, SpO2 100%.        Intake/Output Summary (Last 24 hours) at 09/07/2023 0921 Last data filed at 09/07/2023 0800 Gross per 24 hour  Intake 3115.69 ml  Output 3850 ml  Net -734.31 ml   Filed Weights   09/05/23 0500 09/06/23 0412 09/07/23 0453  Weight: 37.2 kg 36.8 kg 38 kg   Physical exam: General: frail, fatigued appearing woman lying in bed in NAD HEENT: /AT, eyes anicteric Neuro: arouses to verbal stimulation, answers questions, falls back asleep. Globally weak. Chest: breathing comfortably on RA CTAB Heart: S1S2, RRR  Abdomen: soft, NT. Pink ostomy with brown stool, nephrostomy with clear urine Skin: no diffuse rashes  Admission blood culture 3/19-no growth to date 3/20 blood cultures-no growth to date Urine culture-yeast GI panel-normal  Sodium 128 Potassium 3.1 BUN 31 Creatinine 3.13 Magnesium  1.4 Blood glucose 60s -240s  Resolved Hospital Problem list   Hyperkalemia, resolved  Assessment & Plan:  Acute kidney injury due to ischemic ATN in the setting of shock Acute metabolic acidosis Chronic left kidney atrophy, essentially single kidney at this point hyponatremia -Continue monitoring off CRRT.  Appreciate nephrology's management - Strict I's/O -con't IVF -renally dose meds, avoid nephrotoxic meds  Hypokalemia - repleted  Hypomagnesemia - repleted  Hyponatremia, chronic - Nephrology added sodium tablets  Septic shock due to acute urinary tract infection -con't antibiotics -NE to maintain MAP >65  Concern for neurophathic pain -trial of gabapentin TID  Acute metabolic encephalopathy due to sepsis and uremia -avoid sedating meds as able- hard with her pain -monitor labs closely  SBO s/p with lysis of adhesions Recurrent SBO hx, recurrent SBO, ileum/cecum resection with ileostomy creation 2024 [facility]). Recent admission for severe dehydration and high output  -imodium -IVF -husband wants a second opinion and doesn't agree with her surgeon that reversing her ostomy is ill-advised. We discussed that she will have more adhesions now than she used to, and she will always be higher risk for surgeries with previous radiation and so many previous proceduress  Severe malnutrition  Hx of TPN use  -Continue dysphagia diet -adding ensure TID -Husband refusing cortrak- her options are eating vs malnutrition in that case. He understands this.   Prediabetes with hyperglycemia, A1c 5.7% -SSI PRN -hod AM lantus, start 5 units QHS -goal BG 140-180  Anemia and thrombocytopenia of critical illness; hemoconcentrated at admission -transfuse for Hb <7 or hemodynamically significant bleeding    Best Practice (right click and "Reselect all SmartList Selections" daily)   Diet/type: Regular consistency (see orders) DVT prophylaxis prophylactic heparin  Pressure ulcer(s):  N/A GI prophylaxis: N/A Lines: Dialysis Catheter and Arterial Line Foley:  N/A Code Status:  full code Last date of multidisciplinary goals of care discussion: 3/23: Patient's husband updated at bedside.  Labs   CBC: Recent Labs  Lab 09/03/23 2220 09/04/23 0001 09/04/23 2025 09/05/23 0307 09/05/23 0454 09/05/23 0822 09/05/23 1315 09/05/23 1700 09/06/23 0356  WBC 11.9*   < > 10.0 6.6  --  8.7 9.8  --  11.6*  NEUTROABS 10.4*  --   --   --   --   --   --   --   --   HGB 15.2*   < > 8.8* 7.2* 7.4* 7.0* 7.2* 7.4* 8.0*  HCT RESULTS UNAVAILABLE DUE TO INTERFERING SUBSTANCE   < > 23.9* 19.4* 20.2* 19.0* 19.9* 20.2* 21.9*  MCV RESULTS UNAVAILABLE DUE TO INTERFERING SUBSTANCE   < > 90.9 91.5  --  93.1 93.9  --  93.6  PLT 334   < > 178 94* 104* 92* 106*  --  113*   < > = values in this interval not displayed.    Basic Metabolic Panel: Recent Labs  Lab 09/04/23 0001 09/04/23 0618 09/04/23 0620 09/04/23 1626 09/04/23 2000 09/05/23 0307 09/05/23 1610 09/05/23 1700 09/05/23 2021 09/06/23 0356 09/06/23 1634 09/07/23 0409  NA 121*  --    < > 123*  120*   < > 129*   < > 130* 130* 129*  129* 128* 128*  K 5.5*  --    < >  3.8  --  3.5   < > 3.5 3.4* 3.3*  3.2* 3.6 3.1*  CL 73*  --    < > 82*  --  89*   < > 91* 91* 89*  88* 87* 87*  CO2 18*  --    < > 22  --  25   < > 27 27 27  28 26 28   GLUCOSE 83  --    < > 307*  --  136*   < > 83 126* 62*  61* 75 73  BUN 171*  --    < > 75*  --  46*   < > 25* 26* 28*  25* 30* 31*  CREATININE 10.11*  --    < > 5.05*  --  3.32*   < > 2.38* 2.63* 2.91*  2.88* 3.11* 3.13*  CALCIUM 8.5*  --    < > 7.8*  --  8.3*   < > 8.3* 8.6* 8.9  8.9 8.6* 8.5*  MG 1.4* 1.5*  --   --   --  2.3  --   --   --  2.0  --  1.4*  PHOS >30.0*  --    < > 5.3*  --  4.2  --   --   --  2.8 2.0* 2.7   < > = values in this interval not displayed.   GFR: Estimated Creatinine Clearance: 10.5 mL/min (A) (by C-G formula based on SCr of 3.13 mg/dL (H)). Recent Labs  Lab  09/04/23 0001 09/04/23 1818 09/04/23 2025 09/04/23 2100 09/05/23 0307 09/05/23 0822 09/05/23 0841 09/05/23 1315 09/06/23 0356  PROCALCITON 0.82  --   --   --   --   --   --   --   --   WBC 11.9*  --    < >  --  6.6 8.7  --  9.8 11.6*  LATICACIDVEN 1.8 6.6*  --  4.0*  --   --  2.9*  --   --    < > = values in this interval not displayed.    Liver Function Tests: Recent Labs  Lab 09/03/23 1703 09/04/23 0620 09/04/23 1626 09/05/23 0307 09/06/23 0356 09/06/23 1634 09/07/23 0409  AST 30  --   --   --   --   --   --   ALT 54*  --   --   --   --   --   --   ALKPHOS 195*  --   --   --   --   --   --   BILITOT 0.8  --   --   --   --   --   --   PROT 8.0  --   --   --   --   --   --   ALBUMIN 4.3   < > 2.9* 3.3* 3.0* 2.7* 2.6*   < > = values in this interval not displayed.      This patient is critically ill with multiple organ system failure which requires frequent high complexity decision making, assessment, support, evaluation, and titration of therapies. This was completed through the application of advanced monitoring technologies and extensive interpretation of multiple databases. During this encounter critical care time was devoted to patient care services described in this note for 35 minutes.  Steffanie Dunn, DO 09/07/23 10:53 AM [facility]  For contact information, see Amion. If no response to pager, please  call PCCM consult pager. After hours, 7PM- 7AM, please call Elink.

## 2023-09-08 ENCOUNTER — Inpatient Hospital Stay (HOSPITAL_COMMUNITY)

## 2023-09-08 DIAGNOSIS — E8721 Acute metabolic acidosis: Secondary | ICD-10-CM | POA: Diagnosis not present

## 2023-09-08 DIAGNOSIS — A419 Sepsis, unspecified organism: Secondary | ICD-10-CM | POA: Diagnosis not present

## 2023-09-08 DIAGNOSIS — R6521 Severe sepsis with septic shock: Secondary | ICD-10-CM | POA: Diagnosis not present

## 2023-09-08 DIAGNOSIS — R7303 Prediabetes: Secondary | ICD-10-CM

## 2023-09-08 DIAGNOSIS — N179 Acute kidney failure, unspecified: Secondary | ICD-10-CM | POA: Diagnosis not present

## 2023-09-08 HISTORY — PX: IR NEPHROSTOMY EXCHANGE RIGHT: IMG6070

## 2023-09-08 LAB — GLUCOSE, CAPILLARY
Glucose-Capillary: 149 mg/dL — ABNORMAL HIGH (ref 70–99)
Glucose-Capillary: 163 mg/dL — ABNORMAL HIGH (ref 70–99)
Glucose-Capillary: 170 mg/dL — ABNORMAL HIGH (ref 70–99)
Glucose-Capillary: 489 mg/dL — ABNORMAL HIGH (ref 70–99)
Glucose-Capillary: 288 mg/dL — ABNORMAL HIGH (ref 70–99)
Glucose-Capillary: 164 mg/dL — ABNORMAL HIGH (ref 70–99)
Glucose-Capillary: 185 mg/dL — ABNORMAL HIGH (ref 70–99)

## 2023-09-08 LAB — CULTURE, BLOOD (ROUTINE X 2): Culture: NO GROWTH

## 2023-09-08 LAB — RENAL FUNCTION PANEL
Albumin: 2.3 g/dL — ABNORMAL LOW (ref 3.5–5.0)
Anion gap: 13 (ref 5–15)
BUN: 41 mg/dL — ABNORMAL HIGH (ref 8–23)
CO2: 29 mmol/L (ref 22–32)
Calcium: 8.5 mg/dL — ABNORMAL LOW (ref 8.9–10.3)
Chloride: 84 mmol/L — ABNORMAL LOW (ref 98–111)
Creatinine, Ser: 3.33 mg/dL — ABNORMAL HIGH (ref 0.44–1.00)
GFR, Estimated: 15 mL/min — ABNORMAL LOW (ref >60)
Glucose, Bld: 167 mg/dL — ABNORMAL HIGH (ref 70–99)
Phosphorus: 3.6 mg/dL (ref 2.5–4.6)
Potassium: 4.1 mmol/L (ref 3.5–5.1)
Sodium: 126 mmol/L — ABNORMAL LOW (ref 135–145)

## 2023-09-08 LAB — MAGNESIUM: Magnesium: 3.5 mg/dL — ABNORMAL HIGH (ref 1.7–2.4)

## 2023-09-08 LAB — VITAMIN C: Vitamin C: 0.2 mg/dL — ABNORMAL LOW (ref 0.4–2.0)

## 2023-09-08 LAB — VITAMIN K1, SERUM: VITAMIN K1: <0.1 ng/mL — ABNORMAL LOW (ref 0.10–2.20)

## 2023-09-08 MED ORDER — SODIUM CHLORIDE 0.9 % IV BOLUS
250.0000 mL | Freq: Once | INTRAVENOUS | Status: AC
Start: 2023-09-08 — End: 2023-09-08
  Administered 2023-09-08: 250 mL via INTRAVENOUS

## 2023-09-08 MED ORDER — VITAMIN D (ERGOCALCIFEROL) 1.25 MG (50000 UNIT) PO CAPS
50000.0000 [IU] | ORAL_CAPSULE | ORAL | Status: DC
  Filled 2023-09-08: qty 1

## 2023-09-08 MED ORDER — PANCRELIPASE (LIP-PROT-AMYL) 12000-38000 UNITS PO CPEP
24000.0000 [IU] | ORAL_CAPSULE | Freq: Three times a day (TID) | ORAL | Status: DC
  Administered 2023-09-09 – 2023-09-16 (×21): 24000 [IU] via ORAL
  Filled 2023-09-08 (×26): qty 2

## 2023-09-08 MED ORDER — INSULIN GLARGINE-YFGN 100 UNIT/ML ~~LOC~~ SOLN
5.0000 [IU] | Freq: Every day | SUBCUTANEOUS | Status: DC
  Administered 2023-09-08 – 2023-09-10 (×3): 5 [IU] via SUBCUTANEOUS
  Filled 2023-09-08 (×3): qty 0.05

## 2023-09-08 MED ORDER — VITAMIN B-12 1000 MCG PO TABS
1000.0000 ug | ORAL_TABLET | Freq: Every day | ORAL | Status: DC
  Administered 2023-09-08 – 2023-09-19 (×11): 1000 ug via ORAL
  Filled 2023-09-08 (×11): qty 1

## 2023-09-08 MED ORDER — IOHEXOL 300 MG/ML  SOLN
50.0000 mL | Freq: Once | INTRAMUSCULAR | Status: AC | PRN
  Administered 2023-09-08: 10 mL

## 2023-09-08 MED ORDER — LIDOCAINE HCL 1 % IJ SOLN
20.0000 mL | Freq: Once | INTRAMUSCULAR | Status: AC
  Administered 2023-09-08: 10 mL

## 2023-09-08 MED ORDER — NOREPINEPHRINE 4 MG/250ML-% IV SOLN
0.0000 ug/min | INTRAVENOUS | Status: DC
  Administered 2023-09-08: 2 ug/min via INTRAVENOUS
  Administered 2023-09-08: 5 ug/min via INTRAVENOUS
  Filled 2023-09-08: qty 250

## 2023-09-08 MED ORDER — THIAMINE MONONITRATE 100 MG PO TABS
100.0000 mg | ORAL_TABLET | Freq: Every day | ORAL | Status: DC
  Administered 2023-09-08 – 2023-09-19 (×11): 100 mg via ORAL
  Filled 2023-09-08 (×11): qty 1

## 2023-09-08 MED ORDER — CHOLESTYRAMINE LIGHT 4 G PO PACK
4.0000 g | PACK | Freq: Three times a day (TID) | ORAL | Status: DC
  Administered 2023-09-08 – 2023-09-14 (×17): 4 g via ORAL
  Filled 2023-09-08 (×23): qty 1

## 2023-09-08 MED ORDER — MIDODRINE HCL 5 MG PO TABS
10.0000 mg | ORAL_TABLET | Freq: Three times a day (TID) | ORAL | Status: DC
  Administered 2023-09-08 – 2023-09-10 (×8): 10 mg via ORAL
  Filled 2023-09-08 (×7): qty 2

## 2023-09-08 MED ORDER — LIDOCAINE HCL 1 % IJ SOLN
INTRAMUSCULAR | Status: AC
  Filled 2023-09-08: qty 20

## 2023-09-08 MED ORDER — SODIUM CHLORIDE 0.9 % IV SOLN: INTRAVENOUS | Status: DC

## 2023-09-08 NOTE — Progress Notes (Signed)
 Physical Therapy Treatment Patient Details Name: [name] MRN: [id number] DOB: 02/06/1956 Today's Date: 09/08/2023   History of Present Illness 68 y.o. female presents to [facility] 09/03/23 from [facility] due to abnormal lab work. Pt admitted with acute kidney failure, acidosis, and acute encephalopathy. Suspected uretal obstruction vs. Severe dehydration vs. Urosepsis etiology. 3/20 began CRRT. PMHx: atrophic kidney, recurrent AKI, cervical cancer, colovaginal fistula, pyelonephritis, renal atrophy, vertigo    PT Comments  Pt tolerated OOB to chair this date however was both internal and external distracted limiting ability to participate in therapy. Pt requiring constant verbal cues to stay on task and required assistx2 for OOB transfer to chair. Pt remains confused, tangential, only self oriented, and poor memory. At this time recommending inpatient rehab program <3 hours a day to address both cognitive and physical deficits for safe transition home with spouse.    If plan is discharge home, recommend the following: A lot of help with walking and/or transfers;A lot of help with bathing/dressing/bathroom;Assistance with cooking/housework;Assist for transportation;Help with stairs or ramp for entrance   Can travel by private vehicle     No  Equipment Recommendations  None recommended by PT    Recommendations for Other Services       Precautions / Restrictions Precautions Precautions: Fall Recall of Precautions/Restrictions: Impaired Precaution/Restrictions Comments: R nephrostomy, R UQ ileostomy, IJ CRRT Restrictions Weight Bearing Restrictions Per Provider Order: No     Mobility  Bed Mobility Overal bed mobility: Needs Assistance Bed Mobility: Supine to Sit     Supine to sit: HOB elevated, Mod assist     General bed mobility comments: max directional cues, modA to scoot to EOB    Transfers Overall transfer level: Needs assistance Equipment used: 2 person hand held  assist Transfers: Sit to/from Stand Sit to Stand: Mod assist, +2 physical assistance (RN to manage equip)           General transfer comment: max verbal cues, pt with bilat knee and hip flexion and posterior bias despite max verbal and tactile cues to stand up tall    Ambulation/Gait Ambulation/Gait assistance: Mod assist, +2 physical assistance (+RN to manage lines) Gait Distance (Feet): 5 Feet Assistive device: 2 person hand held assist Gait Pattern/deviations: Step-to pattern, Decreased stride length, Shuffle, Knee flexed in stance - right, Knee flexed in stance - left, Trunk flexed       General Gait Details: pt with increased anxiety re: walking, pt attempted to impulsively sit requiring chair to be brought up behind patient to sit   Stairs             Wheelchair Mobility     Tilt Bed    Modified Rankin (Stroke Patients Only)       Balance Overall balance assessment: Needs assistance, Mild deficits observed, not formally tested Sitting-balance support: No upper extremity supported, Feet supported Sitting balance-Leahy Scale: Fair Sitting balance - Comments: pt with posterior bias until scooting forward so feet were on the floor   Standing balance support: Bilateral upper extremity supported Standing balance-Leahy Scale: Zero Standing balance comment: dependent on external support                            Communication Communication Communication: No apparent difficulties  Cognition Arousal: Alert Behavior During Therapy: WFL for tasks assessed/performed, Impulsive (Largerly WFL with occaisonal impulsiveness with movement)   PT - Cognitive impairments: No family/caregiver present to determine baseline, Orientation, Awareness,  Memory, Attention, Sequencing, Safety/Judgement   Orientation impairments: Place, Time, Situation                   PT - Cognition Comments: pt with no carry over of date or place once re-oriented. Pt with  very short attention span and then became hyperfocused on her neck hurting at central line site. Pt requiring max directional verbal cues to complete task Following commands: Impaired Following commands impaired: Follows one step commands with increased time, Follows one step commands inconsistently    Cueing Cueing Techniques: Verbal cues, Tactile cues, Visual cues  Exercises      General Comments General comments (skin integrity, edema, etc.): VSS      Pertinent Vitals/Pain Pain Assessment Pain Assessment: Faces Faces Pain Scale: Hurts whole lot Pain Location: neck at central line site Pain Descriptors / Indicators: Discomfort, Other (Comment) ("like rocks") Pain Intervention(s): Repositioned    Home Living                          Prior Function            PT Goals (current goals can now be found in the care plan section) Acute Rehab PT Goals Patient Stated Goal: to get better PT Goal Formulation: With patient Time For Goal Achievement: 09/18/23 Potential to Achieve Goals: Good Progress towards PT goals: Progressing toward goals    Frequency    Min 2X/week      PT Plan      Co-evaluation              AM-PAC PT "6 Clicks" Mobility   Outcome Measure  Help needed turning from your back to your side while in a flat bed without using bedrails?: A Lot Help needed moving from lying on your back to sitting on the side of a flat bed without using bedrails?: A Lot Help needed moving to and from a bed to a chair (including a wheelchair)?: A Lot Help needed standing up from a chair using your arms (e.g., wheelchair or bedside chair)?: A Lot Help needed to walk in hospital room?: Total Help needed climbing 3-5 steps with a railing? : Total 6 Click Score: 10    End of Session Equipment Utilized During Treatment: Gait belt Activity Tolerance: Patient limited by lethargy;Treatment limited secondary to agitation Patient left: with call bell/phone within  reach;with nursing/sitter in room;in chair;with chair alarm set Nurse Communication: Mobility status;Other (comment) (RN present during session) PT Visit Diagnosis: Other abnormalities of gait and mobility (R26.89);Muscle weakness (generalized) (M62.81)     Time: 1610-9604 PT Time Calculation (min) (ACUTE ONLY): 21 min  Charges:    $Therapeutic Activity: 8-22 mins PT General Charges $$ ACUTE PT VISIT: 1 Visit                     Lewis Shock, PT, DPT Acute Rehabilitation Services Secure chat preferred Office #: [phone]    Iona Hansen 09/08/2023, 1:55 PM

## 2023-09-08 NOTE — Progress Notes (Signed)
 Nutrition Follow-up  DOCUMENTATION CODES:   Severe malnutrition in context of chronic illness, Underweight (recurrent GI surgeries and hospitalizations (5 in the last 6 months), recurrent TPN use)  INTERVENTION:   Discussed high ileostomy output and concerns re: maldigestion/malabsorption with Dr. Chestine Spore. Plan to resume cholestyramine light BID, pt on as outpatient. Continue imodium.   Per discussion with Dr. Chestine Spore, plan to start Creon with meals. Checking fecal pancreatic elastase  Recommend considering TPN initiation with continuation at discharge. If unable to manage ileostomy output, likely would benefit from IV fluid administration at home as well. If not agreeable to this, need to educate husband and pt on oral rehydration solutions to maintain hydration and electrolyte balance at home  Provided Ileostomy Nutrition Therapy handout to Husband and pt. Discussed foods that can provide bulk to stool such as bananas, rice, applesauce, saltines.   Continue oral diet and Ensure but question how much pt is actually digesting and absorbing  Continue MVI with Minerals, Folic Acid. IV Thiamine x 5 days complete, plan to add Thiamine 100 mg daily by mouth Start B12 daily-oral 1000 mcg Start Vitamin D 50,000 units every 48 hours  Given malabsorption may need increased vitamin dosing if taking via enteral route  NUTRITION DIAGNOSIS:   Severe Malnutrition related to chronic illness (recurrent GI surgeries and hospitalizations (5 in the last 6 months), recurrent TPN use) as evidenced by severe fat depletion, severe muscle depletion, percent weight loss.  Continues  GOAL:   Patient will meet greater than or equal to 90% of their needs  Not Met  MONITOR:   Supplement acceptance, PO intake, Labs, Weight trends, I & O's  REASON FOR ASSESSMENT:   Consult Assessment of nutrition requirement/status, Poor PO  ASSESSMENT:   Pt with hx of cervical cancer, SBO (multiple), rectovaginal  fistula, hydronephrosis, recurrent AKI, severe dehydration, chronic malnutrition and TPN use. Hx of urethral stent placement (03/2023), R nephrostomy placement (04/2023), anterior resection with colovaginal fistula repair (2021) and ileum cecum resection w/ ileostomy creation (2024). Pt admitted with acute kidney failure and acidosis w/ concerns for uretal obstruction sent by [facility].  3/19 Admitted, CRRT initated 3/21 CRRT discontinued  No RRT currently. Noted pt received 250 mL NS bolus this AM followed by NS at 75 ml/hr x 24 hours Creatinine trending up  Hyponatremic; noted pt started on salt tabs 1 g BID with meals yesterday  Pt is alert but confused. Husband at bedside.   Pt ate some breakfast and drank Ensure this AM but has experienced 1 L of stool since 7am when RD visited around mid morning. Noted 2L have now been documented Stool is dark in color but watery consistency.  Pt started on imodium scheduled. Husband reports pt was taking something as an outpatient, it was a powder that she took 2x daily. RD to look at home med list.   Pt admitted to [facility] in December; pt admitted with home TPN prior to that admission and TPN continued during admission but discontinued prior to discharge. Pt with significant wt loss over the 2-3 months off TPN. Pt also severely dehydrated on admission with severe AKI   24 Hour Ileostomy Output: 3/23-3/24 1145 mL (reduced oral intake this day-recorded po intake only 10%) 3/22-3/23 2950 mL (no intake recorded this day-unknown) 3/21-3/22 3100 mL (ate 75-100% of meals)  Pt eating well at times, but again pt with high output via ileostomy, which seems to correlate with increased po intake  GI panel this admission negative  Weight  fluctuating between 36.8-38.6 kg  Micronutrient Labs:  CRP: 0.8 (wdl) Folate: 16.9 (wdl) Vitamin B12: 336 (low normal) Vitamin A: pending Vitamin C: pending Vitamin D: 17.33 (L) Vitamin E: pending Vitamin K:  pending  Labs: Sodium 126 (L) Potassium 4.1 (wdl) Creatinine 3.33  Meds:  SS novolog Lantus MVI with Minerals Folic Acid IV thiamine complete Sodium chloride tab midodrine  Diet Order:   Diet Order             Diet regular Fluid consistency: Thin  Diet effective now                   EDUCATION NEEDS:   Education needs have been addressed  Skin:  Skin Assessment: Reviewed RN Assessment  Last BM:  1.5-3 L stoool via ileostomy over 24 hours  Height:   Ht Readings from Last 1 Encounters:  09/03/23 5\' 2"  (1.575 m)    Weight:   Wt Readings from Last 1 Encounters:  09/08/23 38.6 kg    Ideal Body Weight:  50 kg  BMI:  Body mass index is 15.56 kg/m.  Estimated Nutritional Needs:   Kcal:  1550-1750 kcals  Protein:  75-90 g  Fluid:  2L plus additional for high ileostomy output  Romelle Starcher MS, RDN, LDN, CNSC Registered Dietitian 3 Clinical Nutrition RD Inpatient Contact Info in Amion

## 2023-09-08 NOTE — Progress Notes (Signed)
 Speech Language Pathology Treatment: Dysphagia;Cognitive-Linquistic  Patient Details Name: [name] MRN: [id number] DOB: February 25, 1956 Today's Date: 09/08/2023 Time: 1135-1205 SLP Time Calculation (min) (ACUTE ONLY): 30 min  Assessment / Plan / Recommendation Clinical Impression  Patient seen for cognitive and swallow treatment. She was upright in chair consuming lunch upon arrival, distracted by pain in buttocks stating "it feels like rocks" and generally verbally agitated, rolling eyes. SLP provided repositioning with the help of the RN without change in reported discomfort but with an increase in attention to task. With verbal encouragement patient able to self feed dysphagia 3, pureed solids and thin liquids without overt s/s of aspiration and with efficient mastication and bolus clearance. Although she was not observed with regular solids today (would have likely increased agitation if forced), her function appears normal and she is appropriate for upgrade. Spouse in agreement and reports no difficulty prior to admission. She was oriented to person but not to location or rationale for admission. She required moderate cues overall to sustain attention to task but was able to correctly use items on meal tray. Note that spouse reported to evaluating SLP that short term memory deficits have been occurring for some time. Based on presentation today, suspect an undiagnosed cognitive deficits such as dementia. May be beneficial for neurology to f/u as OP. SLP will continue to f/u for cognition at this time to determine if cognition can improve  at least to baseline. No f/u for dysphagia indicated.    HPI HPI: Pt is a 68 yr female with a significant past medical history for cervical cancer 2003 s/p hysterectomy, hx of TPN use/malnutrition, right hydronephrosis s/p uretal stent 03/2023 and R nephrostomy tube placed in 04/2023 due to hospitalization at [facility] for obstructive uropathy AKI and BL  hydronephrosis complicated by Enterobacter and Candida UTI, rectovaginal s/p anterior resection with colovaginal fistula repair, loop ileostomy with closure in 2021, SBO s/p with lysis of adhesions, recurrent SBO, ileum/cecum resection with ileostomy creation 2024, and recent [facility] hospitalization (2/14-2/23/25) which revealed patient had severe dehydration in the setting of high output and poor oral intake who presents to [facility] ED from [facility] on 09/03/23 due to abnormal lab work indicating acute renal failure. Pt admitted with altered mental status; Speech/language cognitive assessment requested.      SLP Plan  Other (Comment) (discharge dysphagia goals)      Recommendations for follow up therapy are one component of a multi-disciplinary discharge planning process, led by the attending physician.  Recommendations may be updated based on patient status, additional functional criteria and insurance authorization.    Recommendations  Diet recommendations: Regular;Thin liquid Liquids provided via: Cup;Straw Medication Administration: Crushed with puree (per patient request) Supervision: Patient able to self feed Compensations: Slow rate;Small sips/bites Postural Changes and/or Swallow Maneuvers: Seated upright 90 degrees                 Oral care BID   Frequent or constant Supervision/Assistance Cognitive communication deficit (R41.841)     Other (Comment) (discharge dysphagia goals)    Kaitlin Lango MA, CCC-SLP  [name]  09/08/2023, 12:12 PM

## 2023-09-08 NOTE — Progress Notes (Signed)
 NAME:  [name], MRN:  [id number], DOB:  1956-06-12, LOS: 5 ADMISSION DATE:  09/03/2023, CONSULTATION DATE: 09/03/23  REFERRING MD:  ED MD Rubin Payor  CHIEF COMPLAINT:  Abnormal Lab work   History of Present Illness:  Pt is a 68 yr female with a significant past medical history for cervical cancer 2003 s/p hysterectomy, hx of TPN use/malnutrition, right hydronephrosis s/p uretal stent 03/2023 and R nephrostomy tube placed in 04/2023 due to hospitalization at [facility] for obstructive uropathy AKI and BL hydronephrosis complicated by Enterobacter and Candida UTI,  rectovaginal s/p anterior resection with colovaginal fistula repair, loop ileostomy with closure in 2021, SBO s/p with lysis of adhesions, recurrent SBO, ileum/cecum resection with ileostomy creation 2024, and recent [facility] hospitalization (2/14-2/23/25) which revealed patient had severe dehydration in the setting of high output and poor oral intake who presents to [facility] ED from [facility] due to abnormal lab work indicating acute renal failure (Cr >10, BUN >169, Anion Gap acidosis, severe hyperkalemia-k>6, and hyponatremia Na 117. Due to severity of acute kidney failure and acidosis, PCCM was consulted for ICU admission and further management along with nephrology consult.  Upon initial assessment, patient sitting up in ED stretcher with husband at beside. Patient with intermittent confusion, and disoriented to situation/time. Therefore husband able to provide and assist with HPI. According to husband, had initial appointment with MD Sandford with [facility] and when lab work came back, husband was advised to take patient to [facility] ED immediately. Per husband, over the last few weeks-patient has had issues with sleeping, more fatigued, and having poor oral intake. Husband endorses that ileostomy and right side nephrostomy tube have had moderate output. Patient states that she feels nauseous and sick on her stomach but no vomiting  currently. Husband denies patient having any chills/fevers, and or sick contacts. Patient tachycardic but normotensive, on room air, O2 sats 99 to 100%,  and malnourished.   Pertinent  Medical History   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   Thyroid disease    Vertigo    Malnutrition   Significant Hospital Events: Including procedures, antibiotic start and stop dates in addition to other pertinent events   3/20 Admit to ICU with Acute Renal Failure with concerns with uretal obstruction  3/21 cont CRRT  Interim History / Subjective:  Overnight no acute events. She ate more of her breakfast this morning and has been working on getting her Ensure in.   Objective   Blood pressure (!) 85/52, pulse (!) 112, temperature (!) 97.2 F (36.2 C), temperature source Axillary, resp. rate 17, height 5\' 2"  (1.575 m), weight 38.6 kg, SpO2 100%.        Intake/Output Summary (Last 24 hours) at 09/08/2023 0846 Last data filed at 09/08/2023 0800 Gross per 24 hour  Intake 851.05 ml  Output 1600 ml  Net -748.95 ml   Filed Weights   09/06/23 0412 09/07/23 0453 09/08/23 0400  Weight: 36.8 kg 38 kg 38.6 kg   Physical exam: General: chronically ill appearing woman lying in bed in NAD HEENT: [place]/AT, eyes anicteric Neuro: more awake today, able to carry on a conversation. Moving extremities.  Chest:  breathing comfortably on RA, CTAB Heart: S1S2, tachycardic Abdomen: soft, minimally TTP, no guarding. Ostomy.  Skin: no diffuse rashes.  Admission blood culture 3/19- NG final 3/20 blood cultures- NGTD Urine culture-yeast GI panel-normal  Sodium 128 Potassium 3.1 BUN 41 Creatinine 3.33 Magnesium 3.5 Phos 2.3 Blood glucose 70-170s  Resolved Hospital Problem list   Hyperkalemia, resolved  Assessment & Plan:  Acute kidney injury due to  ischemic ATN in the setting of shock Acute metabolic acidosis Chronic left kidney atrophy, essentially single kidney at this point hyponatremia -Continue monitoring off CRRT.  Appreciate nephrology's management IVF today, no HD, keeping line. -encourage PO intake to reduce need for IVF -renally dose meds, avoid nephrotoxic meds  Hypokalemia, resolved -monitor  Hypomagnesemia, resolved - monitor  Hyponatremia, chronic- stable today - salt tabs per nephrology -monitor  Septic shock due to acute urinary tract infection -start midodrine; NE PRN to maintain MAP >65 -con't broad antibiotics- zosyn, plan for 7-10 day course due to more complicated anatomy of her urinary tract, duration pending clinical stability.  -IR consulted for nephrostomy tube exchange  Concern for neurophathic pain -con't gabapentin to help with pain  Acute metabolic encephalopathy due to sepsis and uremia -avoiding sedating meds as able; renally dose meds -monitor renal function closely.  SBO s/p with lysis of adhesions Recurrent SBO hx, recurrent SBO, ileum/cecum resection with ileostomy creation 2024 [facility]). Recent admission for severe dehydration and high output  -con't imodium -con't IVF -husband wants a second opinion and doesn't agree with her surgeon that reversing her ostomy is ill-advised. We discussed that she will have more adhesions now than she used to, and she will always be higher risk for surgeries with previous radiation and so many previous procedures. Can be obtained as OP.   Severe malnutrition  Hx of TPN use during previous admissions at [facility] -dysphagia diet; encouraging PO intake. She is against the idea of NTG and seems to understand why we need her to get at least Ensure in.  Prediabetes with hyperglycemia, A1c 5.7%. Less hypoglycemia overnight.  -SSI PRN -glargine 5 units daily -can add mealtime insulin if she is eating a substantial amount -goal BG 140-180  Anemia and  thrombocytopenia of critical illness; hemoconcentrated at admission -transfuse for Hb <7 or hemodynamically significant bleeding    Best Practice (right click and "Reselect all SmartList Selections" daily)   Diet/type: Regular consistency (see orders) DVT prophylaxis prophylactic heparin  Pressure ulcer(s): N/A GI prophylaxis: N/A Lines: Dialysis Catheter and Arterial Line Foley:  N/A Code Status:  full code Last date of multidisciplinary goals of care discussion: 3/23: Patient's husband updated at bedside.  Labs   CBC: Recent Labs  Lab 09/03/23 2220 09/04/23 0001 09/04/23 2025 09/05/23 0307 09/05/23 0454 09/05/23 0822 09/05/23 1315 09/05/23 1700 09/06/23 0356  WBC 11.9*   < > 10.0 6.6  --  8.7 9.8  --  11.6*  NEUTROABS 10.4*  --   --   --   --   --   --   --   --   HGB 15.2*   < > 8.8* 7.2* 7.4* 7.0* 7.2* 7.4* 8.0*  HCT RESULTS UNAVAILABLE DUE TO INTERFERING SUBSTANCE   < > 23.9* 19.4* 20.2* 19.0* 19.9* 20.2* 21.9*  MCV RESULTS UNAVAILABLE DUE TO INTERFERING SUBSTANCE   < > 90.9 91.5  --  93.1 93.9  --  93.6  PLT 334   < > 178 94* 104* 92* 106*  --  113*   < > = values in this interval not displayed.    Basic Metabolic Panel: Recent Labs  Lab 09/04/23 0618 09/04/23 9629 09/05/23 5284  09/05/23 0865 09/06/23 0356 09/06/23 1634 09/07/23 0409 09/07/23 1600 09/08/23 0355  NA  --    < > 129*   < > 129*  129* 128* 128* 126* 126*  K  --    < > 3.5   < > 3.3*  3.2* 3.6 3.1* 4.1 4.1  CL  --    < > 89*   < > 89*  88* 87* 87* 83* 84*  CO2  --    < > 25   < > 27  28 26 28 30 29   GLUCOSE  --    < > 136*   < > 62*  61* 75 73 156* 167*  BUN  --    < > 46*   < > 28*  25* 30* 31* 31* 41*  CREATININE  --    < > 3.32*   < > 2.91*  2.88* 3.11* 3.13* 3.08* 3.33*  CALCIUM  --    < > 8.3*   < > 8.9  8.9 8.6* 8.5* 8.8* 8.5*  MG 1.5*  --  2.3  --  2.0  --  1.4*  --  3.5*  PHOS  --    < > 4.2  --  2.8 2.0* 2.7 2.5 3.6   < > = values in this interval not displayed.    GFR: Estimated Creatinine Clearance: 10 mL/min (A) (by C-G formula based on SCr of 3.33 mg/dL (H)). Recent Labs  Lab 09/04/23 0001 09/04/23 1818 09/04/23 2025 09/04/23 2100 09/05/23 0307 09/05/23 0822 09/05/23 0841 09/05/23 1315 09/06/23 0356  PROCALCITON 0.82  --   --   --   --   --   --   --   --   WBC 11.9*  --    < >  --  6.6 8.7  --  9.8 11.6*  LATICACIDVEN 1.8 6.6*  --  4.0*  --   --  2.9*  --   --    < > = values in this interval not displayed.    Liver Function Tests: Recent Labs  Lab 09/03/23 1703 09/04/23 0620 09/06/23 0356 09/06/23 1634 09/07/23 0409 09/07/23 1600 09/08/23 0355  AST 30  --   --   --   --   --   --   ALT 54*  --   --   --   --   --   --   ALKPHOS 195*  --   --   --   --   --   --   BILITOT 0.8  --   --   --   --   --   --   PROT 8.0  --   --   --   --   --   --   ALBUMIN 4.3   < > 3.0* 2.7* 2.6* 2.6* 2.3*   < > = values in this interval not displayed.      This patient is critically ill with multiple organ system failure which requires frequent high complexity decision making, assessment, support, evaluation, and titration of therapies. This was completed through the application of advanced monitoring technologies and extensive interpretation of multiple databases. During this encounter critical care time was devoted to patient care services described in this note for 34 minutes.  Steffanie Dunn, DO 09/08/23 9:44 AM  Pulmonary & Critical Care  For contact information, see Amion. If no response to pager, please call PCCM consult pager. After hours, 7PM- 7AM, please call  Elink.

## 2023-09-08 NOTE — Progress Notes (Signed)
 [facility] Progress Note  Name: [name] MRN: [id number] DOB: February 27, 1956  Subjective:  She had 455 mL UOP over 3/23 as well as 2 unmeasured urine voids.  She had 1.1 liters ostomy output over 3/23 as charted (down from 3 liters the day before).  Most recently, she has been off of continuous fluids.  Spoke with her RN and she has been confused.  Nursing states that team started immodium yesterday.   Review of systems:  Limited by confusion - states year is 1924 Denies shortness of breath or chest pain  Denies n/v -------------------  Background:  [name] is an 68 y.o. 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  06/2023 right-sided PCN  + ureteral stent. The patient went to [facility] for initial consult of CKD when she was found to have critical lab results and was advised to go to ER. Sodium level 117, potassium 6.6, CO2 10, BUN 169, creatinine level 10.15, anion gap 34, hemoglobin 9.  UA turbid with a chronic proteinuria     Intake/Output Summary (Last 24 hours) at 09/08/2023 0609 Last data filed at 09/08/2023 0532 Gross per 24 hour  Intake 832.2 ml  Output 1600 ml  Net -767.8 ml    Vitals:  Vitals:   09/08/23 0328 09/08/23 0400 09/08/23 0500 09/08/23 0600  BP:      Pulse:  (!) 108 (!) 102 (!) 109  Resp:  11 10 10   Temp: 98.1 F (36.7 C)     TempSrc: Axillary     SpO2:  100% 100% 100%  Weight:  38.6 kg    Height:         Physical Exam:  General elderly female in bed in no acute distress HEENT normocephalic atraumatic extraocular movements intact  sclera anicteric Neck supple trachea midline Lungs clear to auscultation bilaterally normal work of breathing at rest  Heart tachycardic; S1S2 no rub Abdomen soft nontender nondistended Extremities no edema  Psych normal mood and affect Neuro oriented to person; year is 1924 and then next guess is "28 or 29" Access RIJ nontunneled dialysis catheter   Medications reviewed   Labs:     Latest Ref Rng & Units 09/08/2023    3:55 AM 09/07/2023    4:00 PM 09/07/2023    4:09 AM  BMP  Glucose 70 - 99 mg/dL 782  956  73   BUN 8 - 23 mg/dL 41  31  31   Creatinine 0.44 - 1.00 mg/dL 2.13  0.86  5.78   Sodium 135 - 145 mmol/L 126  126  128   Potassium 3.5 - 5.1 mmol/L 4.1  4.1  3.1   Chloride 98 - 111 mmol/L 84  83  87   CO2 22 - 32 mmol/L 29  30  28    Calcium 8.9 - 10.3 mg/dL 8.5  8.8  8.5      Assessment/Plan:   # Acute kidney injury   - AKI concerning for obstructive uropathy as well as ischemic ATN with reduced intravascular volume.  Baseline Cr 0.8- 1.0 06/14/2023.  Patient with history of bilateral hydronephrosis and has right nephrostomy tube.  Now with severe  metabolic derangement with hyperkalemia, severe acidosis and uremic. CT AP atrophic LK, no hydro on rt (essentially operating with only the right kidney).  She was started on  CRRT 3/20-3/21 initially low blood flow rate and low dialysate flow rate because of very high BUN and hyponatremia. Note previously on D5W --------------------- - resume isotonic fluids as below  - No acute indication for dialysis today.  Assess dialysis needs daily   - continue nontunneled dialysis catheter    # Severe hyponatremia, subacute to chronic:  - noted addition of sodium tablets per nephrology previously - Previously on continuous fluids given high ostomy output.  Fluids are now off with hypotension and tachycardic.   - NS 250 mL once now and start 75 ml/hr normal saline  - per charting, mental status improved   # Severe hyperkalemia resolved  with RRT   # Anion gap metabolic acidosis with AKI resolved   # History of obstructive uropathy, bilateral hydronephrosis but currently only operating with the right kidney; no hydro RK on CT scan of abdomen pelvis  # Normocytic anemia  - no ESA given hx of malignancy  - PRBC's per primary team discretion   Disposition - continue inpatient monitoring   Estanislado Emms, MD 09/08/2023 6:34 AM

## 2023-09-09 ENCOUNTER — Inpatient Hospital Stay (HOSPITAL_COMMUNITY)

## 2023-09-09 DIAGNOSIS — A419 Sepsis, unspecified organism: Secondary | ICD-10-CM | POA: Diagnosis not present

## 2023-09-09 DIAGNOSIS — N179 Acute kidney failure, unspecified: Secondary | ICD-10-CM | POA: Diagnosis not present

## 2023-09-09 DIAGNOSIS — Z7189 Other specified counseling: Secondary | ICD-10-CM

## 2023-09-09 DIAGNOSIS — Z515 Encounter for palliative care: Secondary | ICD-10-CM

## 2023-09-09 DIAGNOSIS — R6521 Severe sepsis with septic shock: Secondary | ICD-10-CM | POA: Diagnosis not present

## 2023-09-09 DIAGNOSIS — E8721 Acute metabolic acidosis: Secondary | ICD-10-CM | POA: Diagnosis not present

## 2023-09-09 DIAGNOSIS — E875 Hyperkalemia: Secondary | ICD-10-CM | POA: Diagnosis not present

## 2023-09-09 LAB — RENAL FUNCTION PANEL
Albumin: 2.1 g/dL — ABNORMAL LOW (ref 3.5–5.0)
Anion gap: 11 (ref 5–15)
BUN: 38 mg/dL — ABNORMAL HIGH (ref 8–23)
CO2: 27 mmol/L (ref 22–32)
Calcium: 8.2 mg/dL — ABNORMAL LOW (ref 8.9–10.3)
Chloride: 92 mmol/L — ABNORMAL LOW (ref 98–111)
Creatinine, Ser: 3.02 mg/dL — ABNORMAL HIGH (ref 0.44–1.00)
GFR, Estimated: 16 mL/min — ABNORMAL LOW (ref >60)
Glucose, Bld: 92 mg/dL (ref 70–99)
Phosphorus: 2.8 mg/dL (ref 2.5–4.6)
Potassium: 3.1 mmol/L — ABNORMAL LOW (ref 3.5–5.1)
Sodium: 130 mmol/L — ABNORMAL LOW (ref 135–145)

## 2023-09-09 LAB — GLUCOSE, CAPILLARY
Glucose-Capillary: 114 mg/dL — ABNORMAL HIGH (ref 70–99)
Glucose-Capillary: 158 mg/dL — ABNORMAL HIGH (ref 70–99)
Glucose-Capillary: 159 mg/dL — ABNORMAL HIGH (ref 70–99)
Glucose-Capillary: 181 mg/dL — ABNORMAL HIGH (ref 70–99)
Glucose-Capillary: 183 mg/dL — ABNORMAL HIGH (ref 70–99)
Glucose-Capillary: 75 mg/dL (ref 70–99)
Glucose-Capillary: 95 mg/dL (ref 70–99)

## 2023-09-09 LAB — CBC
HCT: 19 % — ABNORMAL LOW (ref 36.0–46.0)
Hemoglobin: 6.7 g/dL — CL (ref 12.0–15.0)
MCH: 33.8 pg (ref 26.0–34.0)
MCHC: 35.3 g/dL (ref 30.0–36.0)
MCV: 96 fL (ref 80.0–100.0)
Platelets: 159 10*3/uL (ref 150–400)
RBC: 1.98 MIL/uL — ABNORMAL LOW (ref 3.87–5.11)
RDW: 17.3 % — ABNORMAL HIGH (ref 11.5–15.5)
WBC: 13.9 10*3/uL — ABNORMAL HIGH (ref 4.0–10.5)
nRBC: 0 % (ref 0.0–0.2)

## 2023-09-09 LAB — VITAMIN E
Vitamin E (Alpha Tocopherol): 11 mg/L (ref 9.0–29.0)
Vitamin E(Gamma Tocopherol): 0.9 mg/L (ref 0.5–4.9)

## 2023-09-09 LAB — CULTURE, BLOOD (ROUTINE X 2)
Culture: NO GROWTH
Culture: NO GROWTH
Special Requests: ADEQUATE
Special Requests: ADEQUATE

## 2023-09-09 LAB — MAGNESIUM: Magnesium: 2.1 mg/dL (ref 1.7–2.4)

## 2023-09-09 LAB — PREPARE RBC (CROSSMATCH)

## 2023-09-09 LAB — VITAMIN A: Vitamin A (Retinoic Acid): 52.3 ug/dL (ref 22.0–69.5)

## 2023-09-09 MED ORDER — POTASSIUM CHLORIDE 20 MEQ PO PACK: 60.0000 meq | PACK | Freq: Once | ORAL | Status: DC

## 2023-09-09 MED ORDER — SODIUM CHLORIDE 0.9% IV SOLUTION: Freq: Once | INTRAVENOUS | Status: AC

## 2023-09-09 MED ORDER — VITAMIN D (ERGOCALCIFEROL) 1.25 MG (50000 UNIT) PO CAPS
50000.0000 [IU] | ORAL_CAPSULE | ORAL | Status: DC
  Administered 2023-09-09 – 2023-09-16 (×2): 50000 [IU] via ORAL
  Filled 2023-09-09 (×2): qty 1

## 2023-09-09 MED ORDER — DIPHENOXYLATE-ATROPINE 2.5-0.025 MG PO TABS
2.0000 | ORAL_TABLET | Freq: Two times a day (BID) | ORAL | Status: DC
  Administered 2023-09-09 – 2023-09-13 (×10): 2 via ORAL
  Filled 2023-09-09 (×10): qty 2

## 2023-09-09 MED ORDER — CALCIUM POLYCARBOPHIL 625 MG PO TABS
1250.0000 mg | ORAL_TABLET | Freq: Two times a day (BID) | ORAL | Status: DC
  Administered 2023-09-09 – 2023-09-13 (×10): 1250 mg via ORAL
  Filled 2023-09-09 (×11): qty 2

## 2023-09-09 MED ORDER — POTASSIUM CHLORIDE 20 MEQ PO PACK
40.0000 meq | PACK | Freq: Once | ORAL | Status: AC
  Administered 2023-09-09: 40 meq via ORAL
  Filled 2023-09-09: qty 2

## 2023-09-09 MED ORDER — DIPHENOXYLATE-ATROPINE 2.5-0.025 MG PO TABS: 2.0000 | ORAL_TABLET | Freq: Three times a day (TID) | ORAL | Status: DC | PRN

## 2023-09-09 NOTE — Progress Notes (Addendum)
 eLink Physician-Brief Progress Note Patient Name: [name] DOB: 26-Sep-1955 MRN: [id number]   Date of Service  09/09/2023  HPI/Events of Note  68 y.o. female presents to [facility] 09/03/23 from [facility] due to abnormal lab work. Pt admitted with acute kidney failure, acidosis, and acute encephalopathy. Suspected uretal obstruction vs. Severe dehydration vs. Urosepsis etiology   Patient pulled out her HD catheter and exposed an additional inch.  eICU Interventions  Will obtain chest radiograph to verify remains in position.  Seems to be stable in the neck on visual examination.  Would not push the catheter back in.   0445 - Radiograph reviewed, okay to use  0517 -hemoglobin 6.7 this morning, near baseline but meets transfusion criteria.  Transfuse 1 unit PRBC  0606 - kcl ordered  Intervention Category Minor Interventions: Routine modifications to care plan (e.g. PRN medications for pain, fever)  Chrystian Ressler 09/09/2023, 3:08 AM

## 2023-09-09 NOTE — Consult Note (Addendum)
 Consult Note  MARQUELLE BALOW [birth year]  [id number].    Requesting MD: Dr. Chestine Spore Chief Complaint/Reason for Consult: high output ileostomy, ileostomy reversal  HPI:  68 y.o. female with medical history significant for surgical history as below, prediabetes, CKD with chronic left kidney atrophy, Right hydronephrosis s/p PCN, admissions for renal failure in setting of dehydration and most recently admission 07/2023 to [facility] for dehydration/AKI/sepsis secondary to UTI in setting of high output ileostomy. She presented to [facility] ED after lab work by [facility] revealed abnormalities indicating ARF. She was admitted to ICU with nephrology consulted. Patient with mild confusion and husband assists with history. He states that at home she had been increasingly lethargic with poor appetite. She had been having maximum of 500 cc out of ileostomy over a 24h period. She had been taking her cholestyramine but not lomotil or imodium. He states they stopped taking the lomotil and imodium when refills ran out. She last saw general surgery at [facility] 08/08/23 as hospital follow up. She has been on TPN at home in the past but was not on it after her most recent admission.  This am she is sitting up eating breakfast. She is not eating much secondary to low appetite and not liking the food here. She denies n/v/abdominal pain. She has asked about ileostomy reversal at [facility] and was considered too high risk at that time. Husband and brother at bedside.  Surgical history as follows (most recent surgeries through [facility], Dr. 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, fistula repair and hand sewn end to end sigmoidrectal anastomosis with loop ileostomy 2020 loop ileostomy closure 2021 SBO s/p ex lap LOA 2022 ex lap LOA distal ileum and cecum resection with end ileostomy 02/2023  ROS: Reviewed and as above  Family History  Problem Relation  Age of Onset   Heart disease Father    Diabetes Father    Parkinson's disease Neg Hx     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   Thyroid disease    Vertigo     Past Surgical History:  Procedure Laterality Date   ABDOMINAL ADHESION SURGERY  11/08/2020   Dr [name] - [facility]   ABDOMINAL HYSTERECTOMY  2003   [facility] - Dr [name] - for cervical cancer   BILE DUCT EXPLORATION  10/22/2003   CHOLECYSTECTOMY OPEN  10/22/2003   Dr Zachery Dakins - CBD repair w T tube   CYSTOSCOPY  2021   ILEOSTOMY  11/23/2018   ILEOSTOMY CLOSURE  04/2020   IR NEPHROSTOMY EXCHANGE RIGHT  09/08/2023   IR PATIENT EVAL TECH 0-60 MINS  06/20/2023   LOW ANTERIOR BOWEL RESECTION  11/23/2018   LAR/takedown colovaginal fistula/loop ileostomy  - [facility]    Social History:  reports that she has quit smoking. Her smoking use included cigarettes. She has never used smokeless tobacco. She reports that she does not currently use alcohol. She reports that she does not use drugs.  Allergies:  Allergies  Allergen Reactions   Barium-Containing Compounds     hives   Percocet [Oxycodone-Acetaminophen] Itching and Other (See Comments)    migraine     Medications Prior to Admission  Medication Sig Dispense Refill   acetaminophen (TYLENOL) 500 MG tablet Take 1,000 mg  by mouth every 6 (six) hours as needed.     amoxicillin (AMOXIL) 500 MG capsule Take 500 mg by mouth 3 (three) times daily.     cetirizine (ZYRTEC) 10 MG tablet Take 10 mg by mouth daily as needed for allergies or rhinitis.     cholestyramine light (PREVALITE) 4 g packet Take 4 g by mouth 2 (two) times daily.     dronabinol (MARINOL) 2.5 MG capsule Take 2.5 mg by mouth 2 (two) times daily as needed (Appetite).     fluticasone (FLONASE) 50 MCG/ACT nasal spray Place 1 spray into both nostrils daily as needed for  allergies or rhinitis.     levothyroxine (SYNTHROID) 25 MCG tablet Take 1 tablet (25 mcg total) by mouth daily. 90 tablet 3   loperamide (IMODIUM) 2 MG capsule Take 2 mg by mouth 3 (three) times daily as needed for diarrhea or loose stools.     mirtazapine (REMERON) 7.5 MG tablet Take 7.5 mg by mouth at bedtime as needed (Anxiety/appetite).     Multiple Vitamins-Minerals (MULTIVITAMIN) tablet Take 1 tablet by mouth daily.     omeprazole (PRILOSEC) 20 MG capsule Take 20 mg by mouth daily as needed (Acid reflux).     ondansetron (ZOFRAN) 4 MG tablet Take 4 mg by mouth every 6 (six) hours as needed for nausea or vomiting.     promethazine (PHENERGAN) 12.5 MG tablet Take 12.5 mg by mouth every 6 (six) hours as needed for nausea or vomiting.     diphenoxylate-atropine (LOMOTIL) 2.5-0.025 MG tablet Take 2 tablets by mouth 3 (three) times daily as needed for diarrhea or loose stools. (Patient not taking: Reported on 09/04/2023)     fluconazole (DIFLUCAN) 150 MG tablet Take 150 mg by mouth daily. For 7 days (Patient not taking: Reported on 09/04/2023)     simvastatin (ZOCOR) 20 MG tablet Take 20 mg by mouth at bedtime. (Patient not taking: Reported on 09/04/2023)      Blood pressure (!) 85/52, pulse 88, temperature 98.6 F (37 C), temperature source Axillary, resp. rate 16, height 5\' 2"  (1.575 m), weight 38.6 kg, SpO2 100%. Physical Exam: General: pleasant, WD, female who is sitting up in bed in NAD HEENT: head is normocephalic, atraumatic.  Sclera are noninjected.  Pupils equal and round. EOMs intact.  Ears and nose without any masses or lesions.  Mouth is pink and moist Heart: regular, rate, and rhythm.  Normal s1,s2. No obvious murmurs, gallops, or rubs noted.  Palpable radial and pedal pulses bilaterally Lungs: CTAB, no wheezes, rhonchi, or rales noted.  Respiratory effort nonlabored on room air Abd: soft, NT, ND, +BS, no masses, hernias, or organomegaly. Multiple well healed surgical scars. Ileostomy  with liquid and some solid components including eggs from breakfast MSK: all 4 extremities are symmetrical with no cyanosis, clubbing, or edema. Skin: warm and dry with no masses, lesions, or rashes Neuro: Cranial nerves 2-12 grossly intact, sensation is normal throughout Psych: A&Ox2, confused regarding place and situation. States she is at [employer] for orders placed or performed during the hospital encounter of 09/03/23 (from the past 48 hours)  Glucose, capillary     Status: None   Collection Time: 09/07/23  8:32 AM  Result Value Ref Range   Glucose-Capillary 97 70 - 99 mg/dL    Comment: Glucose reference range applies only to samples taken after fasting for at least 8 hours.  Creatinine, urine, random     Status: None   Collection Time:  09/07/23 10:48 AM  Result Value Ref Range   Creatinine, Urine 63 mg/dL    Comment: Performed at [facility], [address], [place], [place] [address]  Na and K (sodium & potassium), rand urine     Status: None   Collection Time: 09/07/23 10:48 AM  Result Value Ref Range   Sodium, Ur 11 mmol/L   Potassium Urine 88 mmol/L    Comment: Performed at [facility], [address], [place], [place] [address]  Osmolality, urine     Status: None   Collection Time: 09/07/23 10:48 AM  Result Value Ref Range   Osmolality, Ur 346 300 - 900 mOsm/kg    Comment: REPEATED TO VERIFY Performed at [facility], [address] [place], [place] [address]   Glucose, capillary     Status: Abnormal   Collection Time: 09/07/23 12:01 PM  Result Value Ref Range   Glucose-Capillary 142 (H) 70 - 99 mg/dL    Comment: Glucose reference range applies only to samples taken after fasting for at least 8 hours.  Renal function panel (daily at 1600)     Status: Abnormal   Collection Time: 09/07/23  4:00 PM  Result Value Ref Range   Sodium 126 (L) 135 - 145 mmol/L   Potassium 4.1 3.5 - 5.1 mmol/L   Chloride 83 (L) 98 - 111 mmol/L   CO2 30 22 -  32 mmol/L   Glucose, Bld 156 (H) 70 - 99 mg/dL    Comment: Glucose reference range applies only to samples taken after fasting for at least 8 hours.   BUN 31 (H) 8 - 23 mg/dL   Creatinine, Ser 2.95 (H) 0.44 - 1.00 mg/dL   Calcium 8.8 (L) 8.9 - 10.3 mg/dL   Phosphorus 2.5 2.5 - 4.6 mg/dL   Albumin 2.6 (L) 3.5 - 5.0 g/dL   GFR, Estimated 16 (L) >60 mL/min    Comment: (NOTE) Calculated using the CKD-EPI Creatinine Equation (2021)    Anion gap 13 5 - 15    Comment: Performed at [facility], [address], [place], [place] [address]  Glucose, capillary     Status: Abnormal   Collection Time: 09/07/23  5:01 PM  Result Value Ref Range   Glucose-Capillary 157 (H) 70 - 99 mg/dL    Comment: Glucose reference range applies only to samples taken after fasting for at least 8 hours.  Glucose, capillary     Status: Abnormal   Collection Time: 09/07/23  7:30 PM  Result Value Ref Range   Glucose-Capillary 176 (H) 70 - 99 mg/dL    Comment: Glucose reference range applies only to samples taken after fasting for at least 8 hours.  Glucose, capillary     Status: None   Collection Time: 09/07/23 11:19 PM  Result Value Ref Range   Glucose-Capillary 77 70 - 99 mg/dL    Comment: Glucose reference range applies only to samples taken after fasting for at least 8 hours.  Glucose, capillary     Status: Abnormal   Collection Time: 09/08/23  3:28 AM  Result Value Ref Range   Glucose-Capillary 149 (H) 70 - 99 mg/dL    Comment: Glucose reference range applies only to samples taken after fasting for at least 8 hours.  Renal function panel (daily at 0500)     Status: Abnormal   Collection Time: 09/08/23  3:55 AM  Result Value Ref Range   Sodium 126 (L) 135 - 145  mmol/L   Potassium 4.1 3.5 - 5.1 mmol/L   Chloride 84 (L) 98 - 111 mmol/L   CO2 29 22 - 32 mmol/L   Glucose, Bld 167 (H) 70 - 99 mg/dL    Comment: Glucose reference range applies only to samples taken after fasting for at least 8 hours.   BUN  41 (H) 8 - 23 mg/dL   Creatinine, Ser 1.61 (H) 0.44 - 1.00 mg/dL   Calcium 8.5 (L) 8.9 - 10.3 mg/dL   Phosphorus 3.6 2.5 - 4.6 mg/dL   Albumin 2.3 (L) 3.5 - 5.0 g/dL   GFR, Estimated 15 (L) >60 mL/min    Comment: (NOTE) Calculated using the CKD-EPI Creatinine Equation (2021)    Anion gap 13 5 - 15    Comment: Performed at [facility], [address], [place], [place] [address]  Magnesium     Status: Abnormal   Collection Time: 09/08/23  3:55 AM  Result Value Ref Range   Magnesium 3.5 (H) 1.7 - 2.4 mg/dL    Comment: Performed at [facility], [address] [place], [place] [address]  Glucose, capillary     Status: Abnormal   Collection Time: 09/08/23  3:58 AM  Result Value Ref Range   Glucose-Capillary 163 (H) 70 - 99 mg/dL    Comment: Glucose reference range applies only to samples taken after fasting for at least 8 hours.  Glucose, capillary     Status: Abnormal   Collection Time: 09/08/23  7:53 AM  Result Value Ref Range   Glucose-Capillary 489 (H) 70 - 99 mg/dL    Comment: Glucose reference range applies only to samples taken after fasting for at least 8 hours.  Glucose, capillary     Status: Abnormal   Collection Time: 09/08/23  7:55 AM  Result Value Ref Range   Glucose-Capillary 170 (H) 70 - 99 mg/dL    Comment: Glucose reference range applies only to samples taken after fasting for at least 8 hours.  Glucose, capillary     Status: Abnormal   Collection Time: 09/08/23 11:03 AM  Result Value Ref Range   Glucose-Capillary 288 (H) 70 - 99 mg/dL    Comment: Glucose reference range applies only to samples taken after fasting for at least 8 hours.  Glucose, capillary     Status: Abnormal   Collection Time: 09/08/23  3:59 PM  Result Value Ref Range   Glucose-Capillary 164 (H) 70 - 99 mg/dL    Comment: Glucose reference range applies only to samples taken after fasting for at least 8 hours.  Glucose, capillary     Status: Abnormal   Collection Time: 09/08/23   7:33 PM  Result Value Ref Range   Glucose-Capillary 185 (H) 70 - 99 mg/dL    Comment: Glucose reference range applies only to samples taken after fasting for at least 8 hours.  Glucose, capillary     Status: Abnormal   Collection Time: 09/09/23 12:05 AM  Result Value Ref Range   Glucose-Capillary 114 (H) 70 - 99 mg/dL    Comment: Glucose reference range applies only to samples taken after fasting for at least 8 hours.  Glucose, capillary     Status: None   Collection Time: 09/09/23  4:30 AM  Result Value Ref Range   Glucose-Capillary 75 70 - 99 mg/dL    Comment: Glucose reference range applies only to samples taken after fasting for at least 8 hours.  Renal function panel (daily at 0500)  Status: Abnormal   Collection Time: 09/09/23  4:36 AM  Result Value Ref Range   Sodium 130 (L) 135 - 145 mmol/L   Potassium 3.1 (L) 3.5 - 5.1 mmol/L   Chloride 92 (L) 98 - 111 mmol/L   CO2 27 22 - 32 mmol/L   Glucose, Bld 92 70 - 99 mg/dL    Comment: Glucose reference range applies only to samples taken after fasting for at least 8 hours.   BUN 38 (H) 8 - 23 mg/dL   Creatinine, Ser 5.40 (H) 0.44 - 1.00 mg/dL   Calcium 8.2 (L) 8.9 - 10.3 mg/dL   Phosphorus 2.8 2.5 - 4.6 mg/dL   Albumin 2.1 (L) 3.5 - 5.0 g/dL   GFR, Estimated 16 (L) >60 mL/min    Comment: (NOTE) Calculated using the CKD-EPI Creatinine Equation (2021)    Anion gap 11 5 - 15    Comment: Performed at [facility], [address] [place], [place] [address]  Magnesium     Status: None   Collection Time: 09/09/23  4:36 AM  Result Value Ref Range   Magnesium 2.1 1.7 - 2.4 mg/dL    Comment: Performed at [facility], [address], [place], [place] [address]  CBC     Status: Abnormal   Collection Time: 09/09/23  4:36 AM  Result Value Ref Range   WBC 13.9 (H) 4.0 - 10.5 K/uL   RBC 1.98 (L) 3.87 - 5.11 MIL/uL   Hemoglobin 6.7 (LL) 12.0 - 15.0 g/dL    Comment: REPEATED TO VERIFY THIS CRITICAL RESULT HAS VERIFIED AND  BEEN CALLED TO J. CRUZ REYES, RN BY JEZREEL LAS IGAN ON 03 25 2025 AT 0456, AND HAS BEEN READ BACK.     HCT 19.0 (L) 36.0 - 46.0 %   MCV 96.0 80.0 - 100.0 fL   MCH 33.8 26.0 - 34.0 pg   MCHC 35.3 30.0 - 36.0 g/dL   RDW 95.6 (H) 21.3 - 08.6 %   Platelets 159 150 - 400 K/uL    Comment: REPEATED TO VERIFY   nRBC 0.0 0.0 - 0.2 %    Comment: Performed at [facility], [address], [place], [place] [address]  Prepare RBC (crossmatch)     Status: None   Collection Time: 09/09/23  5:29 AM  Result Value Ref Range   Order Confirmation      ORDER PROCESSED BY BLOOD BANK Performed at [facility], [address] [place], [place] [address]   Type and screen [facility]     Status: None (Preliminary result)   Collection Time: 09/09/23  5:30 AM  Result Value Ref Range   ABO/RH(D) A POS    Antibody Screen NEG    Sample Expiration 09/12/2023,2359    Unit Number [id number]    Blood Component Type RBC LR PHER1    Unit division 00    Status of Unit ISSUED    Transfusion Status OK TO TRANSFUSE    Crossmatch Result      Compatible Performed at [facility], [address], [place], [place] [address]    DG CHEST PORT 1 VIEW Result Date: 09/09/2023 CLINICAL DATA:  Status post central line placement EXAM: PORTABLE CHEST 1 VIEW COMPARISON:  09/04/2023 FINDINGS: Right jugular central line is again seen. No pneumothorax is identified. The lungs are clear bilaterally. No cardiac abnormality is noted. IMPRESSION: Right jugular central line without complicating factors. Electronically Signed  By: Alcide Clever M.D.   On: 09/09/2023 02:59   IR NEPHROSTOMY EXCHANGE RIGHT Result Date: 09/08/2023 INDICATION: Right nephrostomy tube requiring exchange. Tube originally placed on 05/08/2023 and last exchanged on 07/09/2023. EXAM: RIGHT PERCUTANEOUS NEPHROSTOMY TUBE EXCHANGE UNDER FLUOROSCOPY COMPARISON:  None Available. MEDICATIONS: None ANESTHESIA/SEDATION: None CONTRAST:   10 mL Omnipaque 300-administered into the collecting system(s) FLUOROSCOPY: Radiation Exposure Index (as provided by the fluoroscopic device): 1.0 mGy Kerma COMPLICATIONS: None immediate. PROCEDURE: Informed written consent was obtained from the patient after a thorough discussion of the procedural risks, benefits and alternatives. All questions were addressed. Maximal Sterile Barrier Technique was utilized including caps, mask, sterile gowns, sterile gloves, sterile drape, hand hygiene and skin antiseptic. A timeout was performed prior to the initiation of the procedure. A pre-existing right-sided 10 French nephrostomy tube was injected with contrast material, cut and removed over a guidewire. A new 10 French catheter was advanced over the wire and formed in the right renal pelvis. Catheter position was confirmed with a saved fluoroscopic spot image obtained after injection of contrast. The catheter was secured at the skin with a Prolene retention suture and adhesive StatLock device. It was attached to a new gravity drainage bag. IMPRESSION: Exchange of indwelling 10 French percutaneous nephrostomy tube under fluoroscopy. Electronically Signed   By: Irish Lack M.D.   On: 09/08/2023 15:38      Assessment/Plan Complex surgical history S/p ileostomy, high output and on TPN in the past  Patient seen and examined and relevant labs and imaging reviewed as well as documents in Care everywhere regarding care at [facility]. CT 3/19 on admission without abnormality regarding small bowel. Ileostomy has been high output with 3625 ml in last 24h. She has been on home imodium and cholestyramine regimen. Will add lomotil bid scheduled (was tid prn on home meds) and fiber 1250 mg bid. Continue to follow output closely and may need to further adjust regimen. We discussed the possible need for TPN acutely vs long term. She and husband are opposed to home TPN and want to consider acute TPN further should it become  indicated. Husband has very reasonable concerns regarding quality of life and risk for infection in setting of long term TPN.   In regard to patient and spouse desire for ileostomy reversal - she is followed by [facility] general surgery and was considered too high risk at last visit with them - her last surgery involved 4 hours of adhesiolysis and multiple enterotomies. We discussed at length that this is not indicated acutely given in setting of infection and septic shock. And ultimately would not recommend reversal here in the future given her complex surgical history and history of radiation which places her at high risk for elective reversal and would recommend she can continue to follow up with [facility] general surgery as needed.  FEN: reg ID: [id number] VTE: held in setting of anemia  AKI R hydronephrosis s/p ureteral stent 10/24, s/p PCN 04/2023 Urosepsis, shock - on levophed this am malnutrition  I reviewed Consultant nephrology notes, hospitalist notes, last 24 h vitals and pain scores, last 48 h intake and output, last 24 h labs and trends, and last 24 h imaging results.   Eric Form, [facility] 09/09/2023, 8:11 AM Please see Amion for pager number during day hours 7:00am-4:30pm

## 2023-09-09 NOTE — Consult Note (Cosign Needed Addendum)
 Palliative Care Consult Note                                  Date: 09/09/2023   Patient Name: [name]  DOB: [birth year]  MRN: [id number]  Age / Sex: 68 y.o., female  PCP: Patient, [name] Referring Physician: Steffanie Dunn, DO  Reason for Consultation: Establishing goals of care  HPI/Patient Profile: 68 y.o. 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  She presented from outpatient nephrology with critical labs including creatinine of 10.15.  She was admitted on 09/03/2023 with AKI due to ischemic ATN from septic shock, multiple electrolyte abnormalities, septic shock due to urinary tract infection, acute metabolic encephalopathy due to sepsis and uremia, history of recurrent SBO, severe malnutrition, high output ostomy, and others.   Palliative medicine was consulted for GOC conversations.  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  I met with the patient at the bedside.  The patient is alert and interactive though somewhat confused.  Her husband [name] is also at the bedside.   We meet to discuss  diagnosis prognosis, GOC, EOL wishes, disposition and options. Concept of Palliative Care was introduced as specialized medical care for people and their families living with serious illness.  If focuses on providing relief from the symptoms and stress of a serious illness.  The goal is to improve quality of life for both the patient and the family. Values and goals of care important to patient and family were attempted to be elicited.  Created space and opportunity for patient  and family to explore thoughts and feelings regarding current medical situation   Natural trajectory and current clinical status were discussed. Questions and concerns addressed. Patient  encouraged to call with questions or concerns.    Patient/Family Understanding of Illness: Her husband understands that her bag cannot be removed.  He states the problem is how fast everything goes through her and that she is not absorbing anything.  They have requested for the back to be replaced but have been told that it cannot.  Husband questions the risk and indicated that possible risk from surgery is better than what she is dealing with now.  We spent a significant amount of time discussing patient's current clinical status.  Life Review: The patient and her husband [name] live at home together.  Prior to all of her ostomy placement, SBO, etc. they live a very functional life.  They would go shopping together, garden together, etc.  Now they do not do anything unless they have to and everything revolves around "the bag".  Overall her husband feels she has a very poor quality of life.  Patient Values: Functional independence, quality of life  Goals: Their main goal is to get the ostomy reversed and try to return to a functional and normal life.  They would like to avoid TPN.  They are open to considering temporary TPN if it is a bridge to ostomy reversal.  Today's Discussion: In addition to discussions described above we have  substantial discussion of various topics.  Overall the patient and her husband are very frustrated and overwhelmed.  We discussed that surgery at 2 institutions have turned down ostomy reversal.  Husband and sister must be certain someone who is willing to try.  Again her husband feels that even a high surgical risk and potential death is better than this.  Patient and husband are both very against long-term TPN.  I discussed that short-term TPN is an option, he shares that this would only be considered as a bridge to ostomy reversal otherwise he knows that we will turn into long-term TPN which she is not willing to do.  The patient's husband laments what all of this is doing to her quality of life.  We discussed quality life is a major factor in their decision making.  It is for this reason that he is against TPN.  He also states that they will not allow her to live at a skilled nursing facility/long-term care.  He notes that the patient has told him she does not want TPN at all.  He raises very important questions regarding infection risk related to this.  Another question if patient has is why she keeps getting these infections.  She feels like that she gets an infection, comes into the hospital, goes on TPN, goes home and it "simply wash, rinse, repeat."  Spent time discussing the current plan.  They are adding and adjusting medications with a goal of slowing down her high output ostomy.  I shared that if we can slow down the output this will allow her to stabilize as far as electrolytes and possibly nutrition.  He again emphasized that he would like information on why she keeps getting urinary tract infections and I shared that I would discuss with the medical team.  I offered palliative medicine follow-up tomorrow to check in on patient and see how she is doing which they agreed.  I provided contact information to the patient's husband with the palliative contact number for any questions or concerns.  I  shared with the medical team and nursing team how to contact us as well. I provided emotional and general support through therapeutic listening, empathy, sharing of stories, therapeutic touch, and other techniques. I answered all questions and addressed all concerns to the best of my ability.  Review of Systems  Constitutional:        Admits some discomfort related to positioning, nursing has just repositioned her  Respiratory:  Negative for cough and shortness of breath.   Gastrointestinal:  Positive for abdominal pain (Mild) and diarrhea (Profuse ostomy output). Negative for nausea and vomiting.    Objective:   Primary Diagnoses: Present on Admission:  Acute kidney injury [facility])   Physical Exam Vitals and nursing note reviewed.  Constitutional:      General: She is not in acute distress.    Appearance: She is ill-appearing.  HENT:     Head: Normocephalic and atraumatic.  Cardiovascular:     Rate and Rhythm: Normal rate.  Pulmonary:  Effort: Pulmonary effort is normal. No respiratory distress.     Breath sounds: No wheezing or rhonchi.  Abdominal:     General: Abdomen is flat. There is no distension.     Palpations: Abdomen is soft.     Tenderness: There is abdominal tenderness.  Skin:    General: Skin is warm and dry.  Neurological:     Mental Status: She is alert. She is confused.  Psychiatric:        Behavior: Behavior normal.     Comments: Occasional slightly irritated mood     Vital Signs:  BP (!) 85/52   Pulse 93   Temp 98.1 F (36.7 C) (Oral)   Resp 19   Ht 5\' 2"  (1.575 m)   Wt 38.6 kg   SpO2 100%   BMI 15.56 kg/m   Palliative Assessment/Data: 10%    Advanced Care Planning:   Existing Vynca/ACP Documentation: None  Primary Decision Maker: NEXT OF KIN  Code Status/Advance Care Planning: Full code  A discussion was had today regarding advanced directives. Concepts specific to code status, artifical feeding and hydration, continued IV  antibiotics and rehospitalization was had.  The difference between a aggressive medical intervention path and a palliative comfort care path for this patient at this time was had.   Decisions/Changes to ACP: None today  Assessment & Plan:   Impression: 68 year old female with acute presentation of chronic comorbidities as described above.  The patient has an extensive surgical history as noted above.  She has a high output ostomy which they were hoping to get reversed, but surgery here has recommended she return to [facility]. At last visit Dr. [name] at [facility] counseled about high risk and patient was quite hesitant, but Dr. [name] offered to revisit in the future if desired..  Family is very very reluctant to accept TPN, very against long-term TPN especially.  They are hoping to slow down ostomy output, allow her to be out of the hospital, and eventually get ostomy reversed.  Overall prognosis poor  SUMMARY OF RECOMMENDATIONS   Full code Full scope of care Very much against TPN Hopeful for improvement in ostomy output Palliative medicine will continue to follow  Symptom Management:  Per primary team PMT is available to assist as needed  Prognosis:  Unable to determine  Discharge Planning:  To Be Determined   Discussed with: Patient, family, medical team, nursing team    Thank you for allowing [place] to participate in the care of [name] PMT will continue to support holistically.  Time Total: 111 min  Detailed review of medical records (labs, imaging, vital signs), medically appropriate exam, discussed with treatment team, counseling and education to patient, family, & staff, documenting clinical information, medication management, coordination of care  Signed by: Wynne Dust, NP Palliative Medicine Team  Team Phone # [phone] (Nights/Weekends)  09/09/2023, 2:57 PM

## 2023-09-09 NOTE — Progress Notes (Signed)
 OT Cancellation Note  Patient Details Name: [name] MRN: [id number] DOB: 1955/08/16   Cancelled Treatment:    Reason Eval/Treat Not Completed: Patient declined, no reason specified Planned to see pt at 1130AM for OT session but on arrival, pt with lunch and declined OT session. Will follow up as schedule permits.  Lorre Munroe 09/09/2023, 11:35 AM

## 2023-09-09 NOTE — Progress Notes (Signed)
 NAME:  [name], MRN:  [id number], DOB:  [birth year], LOS: 6 ADMISSION DATE:  09/03/2023, CONSULTATION DATE: 09/03/23  REFERRING MD:  ED MD Rubin Payor  CHIEF COMPLAINT:  Abnormal Lab work   History of Present Illness:  Pt is a 68 yr female with a significant past medical history for cervical cancer 2003 s/p hysterectomy, hx of TPN use/malnutrition, right hydronephrosis s/p uretal stent 03/2023 and R nephrostomy tube placed in 04/2023 due to hospitalization at [facility] for obstructive uropathy AKI and BL hydronephrosis complicated by Enterobacter and Candida UTI, rectovaginal s/p anterior resection with colovaginal fistula repair, loop ileostomy with closure in 2021, SBO s/p with lysis of adhesions, recurrent SBO, ileum/cecum resection with ileostomy creation 2024, and recent [facility] hospitalization (2/14-2/23/25) which revealed patient had severe dehydration in the setting of high output and poor oral intake who presents to [facility] ED from [facility] due to abnormal lab work indicating acute renal failure (Cr >10, BUN >169, Anion Gap acidosis, severe hyperkalemia-k>6, and hyponatremia Na 117. Due to severity of acute kidney failure and acidosis, PCCM was consulted for ICU admission and further management along with nephrology consult.  Upon initial assessment, patient sitting up in ED stretcher with husband at beside. Patient with intermittent confusion, and disoriented to situation/time. Therefore husband able to provide and assist with HPI. According to husband, had initial appointment with MD Sandford with [facility] and when lab work came back, husband was advised to take patient to [facility] ED immediately. Per husband, over the last few weeks-patient has had issues with sleeping, more fatigued, and having poor oral intake. Husband endorses that ileostomy and right side nephrostomy tube have had moderate output. Patient states that she feels nauseous and sick on her stomach but no vomiting  currently. Husband denies patient having any chills/fevers, and or sick contacts. Patient tachycardic but normotensive, on room air, O2 sats 99 to 100%,  and malnourished.   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  Remains on low dose pressors but weaning to off per RN. Ostomy output remains high, ~1L out overnight, total 4L out over 24hr Hgb 6.7, PRBC ordered Objective   Blood pressure (!) 85/52, pulse 76, temperature 99.7 F (37.6 C), temperature source Oral, resp. rate (!) 21, height 5\' 2"  (1.575 m), weight 38.6 kg, SpO2 100%.        Intake/Output Summary (Last 24 hours) at 09/09/2023 0909 Last data filed at 09/09/2023 0905 Gross per 24 hour  Intake 3114.37 ml  Output 5225 ml  Net -2110.63 ml   Filed Weights   09/06/23 0412 09/07/23 0453 09/08/23 0400  Weight: 36.8 kg 38 kg 38.6 kg   Physical exam: General: chronically ill appearing woman lying in bed in NAD HEENT: [place]/AT, eyes anicteric Neuro: awake, alert, answers questions appropriately, mild confusion at times. MAE  Chest:  breathing comfortably on RA, CTAB Heart: S1S2, tachycardic Abdomen: soft, minimally TTP, no guarding.  Ostomy.  Skin: no diffuse rashes.  Admission blood culture 3/19- NG final 3/20 blood cultures- NGTD Urine culture-yeast GI panel-normal   Resolved Hospital Problem list   Hyperkalemia, resolved  Assessment & Plan:  Acute kidney injury due to ischemic ATN in the setting of shock Acute  metabolic acidosis Chronic left kidney atrophy, essentially single kidney at this point hyponatremia -Continue monitoring off CRRT.  Appreciate nephrology's management IVF. No HD today, daily assessment  -encourage PO intake to reduce need for IVF -renally dose meds, avoid nephrotoxic meds  Hypokalemia -monitor -replete K PRN  -f/u chem   Hypomagnesemia, resolved - monitor  Hyponatremia, chronic- stable today - salt tabs per nephrology - f/u chem   Septic shock due to acute urinary tract infection -continue midodrine; NE PRN to maintain MAP >65 -con't broad antibiotics- zosyn, plan for 7-10 day course due to more complicated anatomy of her urinary tract, duration pending clinical stability.  -nephrostomy changed by IR 3/24  Concern for neurophathic pain -con't gabapentin to help with pain  Acute metabolic encephalopathy due to sepsis and uremia -avoiding sedating meds as able; renally dose meds -monitor renal function closely.  SBO s/p with lysis of adhesions Recurrent SBO hx, recurrent SBO, ileum/cecum resection with ileostomy creation 2024 [facility]). Recent admission for severe dehydration and high output  -con't imodium -con't IVF -husband wants a second opinion and doesn't agree with her surgeon that reversing her ostomy is ill-advised. Surgery to see today for input ZO:XWRU ostomy output as well as ?potential for future ostomy reversal   Severe malnutrition  Hx of TPN use during previous admissions at [facility] -dysphagia diet; encouraging PO intake. She is against the idea of NTG and seems to understand why we need her to get at least Ensure in. -ongoing goals of care discussions, ?addition of TPN.   Prediabetes with hyperglycemia, A1c 5.7%. Less hypoglycemia overnight.  -SSI PRN -glargine 5 units daily -can add mealtime insulin if she is eating a substantial amount -goal BG 140-180  Anemia and thrombocytopenia of critical illness; hemoconcentrated at admission -transfuse  for Hb <7 or hemodynamically significant bleeding -1 unit PRBC 3/25   Best Practice (right click and "Reselect all SmartList Selections" daily)   Diet/type: Regular consistency (see orders) DVT prophylaxis prophylactic heparin  Pressure ulcer(s): N/A GI prophylaxis: N/A Lines: Dialysis Catheter and Arterial Line Foley:  N/A Code Status:  full code Last date of multidisciplinary goals of care discussion: 3/25: Patient's husband updated at bedside.  Labs   CBC: Recent Labs  Lab 09/03/23 2220 09/04/23 0001 09/05/23 0307 09/05/23 0454 09/05/23 0822 09/05/23 1315 09/05/23 1700 09/06/23 0356 09/09/23 0436  WBC 11.9*   < > 6.6  --  8.7 9.8  --  11.6* 13.9*  NEUTROABS 10.4*  --   --   --   --   --   --   --   --   HGB 15.2*   < > 7.2* 7.4* 7.0* 7.2* 7.4* 8.0* 6.7*  HCT RESULTS UNAVAILABLE DUE TO INTERFERING SUBSTANCE   < > 19.4* 20.2* 19.0* 19.9* 20.2* 21.9* 19.0*  MCV RESULTS UNAVAILABLE DUE TO INTERFERING SUBSTANCE   < > 91.5  --  93.1 93.9  --  93.6 96.0  PLT 334   < > 94* 104* 92* 106*  --  113* 159   < > = values in this interval not displayed.    Basic Metabolic Panel: Recent Labs  Lab 09/05/23 0307 09/05/23 0454 09/06/23 0356 09/06/23 1634 09/07/23 0409 09/07/23  1600 09/08/23 0355 09/09/23 0436  NA 129*   < > 129*  129* 128* 128* 126* 126* 130*  K 3.5   < > 3.3*  3.2* 3.6 3.1* 4.1 4.1 3.1*  CL 89*   < > 89*  88* 87* 87* 83* 84* 92*  CO2 25   < > 27  28 26 28 30 29 27   GLUCOSE 136*   < > 62*  61* 75 73 156* 167* 92  BUN 46*   < > 28*  25* 30* 31* 31* 41* 38*  CREATININE 3.32*   < > 2.91*  2.88* 3.11* 3.13* 3.08* 3.33* 3.02*  CALCIUM 8.3*   < > 8.9  8.9 8.6* 8.5* 8.8* 8.5* 8.2*  MG 2.3  --  2.0  --  1.4*  --  3.5* 2.1  PHOS 4.2  --  2.8 2.0* 2.7 2.5 3.6 2.8   < > = values in this interval not displayed.   GFR: Estimated Creatinine Clearance: 11 mL/min (A) (by C-G formula based on SCr of 3.02 mg/dL (H)). Recent Labs  Lab 09/04/23 0001 09/04/23 1818  09/04/23 2025 09/04/23 2100 09/05/23 0307 09/05/23 0822 09/05/23 0841 09/05/23 1315 09/06/23 0356 09/09/23 0436  PROCALCITON 0.82  --   --   --   --   --   --   --   --   --   WBC 11.9*  --    < >  --    < > 8.7  --  9.8 11.6* 13.9*  LATICACIDVEN 1.8 6.6*  --  4.0*  --   --  2.9*  --   --   --    < > = values in this interval not displayed.    Liver Function Tests: Recent Labs  Lab 09/03/23 1703 09/04/23 0620 09/06/23 1634 09/07/23 0409 09/07/23 1600 09/08/23 0355 09/09/23 0436  AST 30  --   --   --   --   --   --   ALT 54*  --   --   --   --   --   --   ALKPHOS 195*  --   --   --   --   --   --   BILITOT 0.8  --   --   --   --   --   --   PROT 8.0  --   --   --   --   --   --   ALBUMIN 4.3   < > 2.7* 2.6* 2.6* 2.3* 2.1*   < > = values in this interval not displayed.      This patient is critically ill with multiple organ system failure which requires frequent high complexity decision making, assessment, support, evaluation, and titration of therapies. This was completed through the application of advanced monitoring technologies and extensive interpretation of multiple databases. During this encounter critical care time was devoted to patient care services described in this note for 32 minutes. Dirk Dress, NP Pulmonary/Critical Care Medicine  09/09/2023  9:09 AM   See Loretha Stapler for personal pager PCCM on call pager [phone] until 7pm. Please call Elink 7p-7a. [phone]

## 2023-09-09 NOTE — Progress Notes (Signed)
 Nutrition Follow-up  DOCUMENTATION CODES:   Severe malnutrition in context of chronic illness, Underweight (recurrent GI surgeries and hospitalizations (5 in the last 6 months), recurrent TPN use)  INTERVENTION:   If algins with GOC and pt desires aggressive nutrition intervention, recommend initiation of TPN. RD concerned that without nutrition support, pt will continue to lose weight, nutritional status will continue to worsen. Noted MDs and Palliative Care has spoke to patient and husband. Currently no plan for TPN  If output remains high and pt not discharged on IV fluids, plan to discuss oral rehydration solution recipes with husband   Continue Renal MVI, Thiamine, Folic Acid, B12, Vit D supplementation Recommend addition of Vitamin C as well but concerned pt is not going to absorb   NUTRITION DIAGNOSIS:   Severe Malnutrition related to chronic illness (recurrent GI surgeries and hospitalizations (5 in the last 6 months), recurrent TPN use) as evidenced by severe fat depletion, severe muscle depletion, percent weight loss.  Continues but being addressed  GOAL:   Patient will meet greater than or equal to 90% of their needs  Not Met  MONITOR:   Supplement acceptance, PO intake, Labs, Weight trends, I & O's  REASON FOR ASSESSMENT:   Consult Assessment of nutrition requirement/status, Poor PO  ASSESSMENT:   Pt with hx of cervical cancer, SBO (multiple), rectovaginal fistula, hydronephrosis, recurrent AKI, severe dehydration, chronic malnutrition and TPN use. Hx of urethral stent placement (03/2023), R nephrostomy placement (04/2023), anterior resection with colovaginal fistula repair (2021) and ileum cecum resection w/ ileostomy creation (2024). Pt admitted with acute kidney failure and acidosis w/ concerns for uretal obstruction sent by [facility].  Noted possibility of acute TPN vs chronic TPN was discussed with pt and husband by Surgery. Appears husband remains  opposed to home TPN but may be agreeable to acute TPN. Today, noted no plan for TPN acutely if not going to continue at discharge which makes complete sense.  Pt also remains on IVF due to high GI losses-NS at 75 ml/hr Remains on salt tabs  Pt with highest ileostomy output since admission over the last 24 hours. This is after pt was started on cholestyramine light TID yesterday with continued imodium in addition to Creon with meals.  Surgery started Lomotil 2 tabs BID  in addition to Fibercon tabs yesterday  24 Hour Ileostomy Output: 1825 mL thus far today 3/25-3/26 5310 mL  3/24-3/25 3625 mL (30% at Breakfast, 75% of Dinner, lunch not recorded; noted po liquid intake almost 1L per I/O) 3/23-3/24 1145 mL (reduced oral intake this day-recorded po intake only 10%) 3/22-3/23 2950 mL (no intake recorded this day-unknown) 3/21-3/22 3100 mL (ate 75-100% of meals)   UOP: 950 mL via Nephrostomy with 2 unmeasured urine occurrences in 24 hours  Micronutrient Labs (09/04/23): CRP 0.8 (wdl)- normal CRP suggests inflammation not impacting lab values Folate: 16.9 (wdl) Vitamin B12: 336 (low normal) Vitamin A: 52.3 (wdl) Vitamin C: 0.2 (L) Vitamin D: 17.33 (L) Vitamin E: gamma: 0.9, alpha 11.0 (wdl) Vitamin K: <0.01 (low serum value, did not check TG) Zinc: 55 (wdl-low normal)  Will need to discuss some of the micronutrient results with provider prior to further supplementation. Plan to add Vit C however  Surgical History as outline in General Surgery note today:  cervical cancer s/p radiation rectovaginal fistula s/p redo LAR with colovaginal fistula takedown, fistula repair and hand sewn end to end sigmoidrectal anastomosis with loop ileostomy 2020 loop ileostomy closure 2021 SBO s/p ex lap LOA  2022 ex lap LOA distal ileum and cecum resection with end ileostomy 02/2023  Labs: Sodium 130 (L) Potassium 3.1 (L) Creatinine 3.02 (trending back down) BUN 38 (relatively stable) Phosphorus 2.8  (wdl) Magnesium 2.1 (wdl)   Meds:  SS novolog Lantus MVI with Minerals Folic Acid IV thiamine complete- Thiamine po now Sodium chloride tab Midodrine B12 Vit D-noted Nephrology changed Vit D dosing to every 7 days NS at 75 ml/hr  Diet Order:   Diet Order             Diet regular Fluid consistency: Thin  Diet effective now                   EDUCATION NEEDS:   Education needs have been addressed  Skin:  Skin Assessment: Reviewed RN Assessment  Last BM:  5 L stool via ileostomy in 24 hours  Height:   Ht Readings from Last 1 Encounters:  09/03/23 5\' 2"  (1.575 m)    Weight:   Wt Readings from Last 1 Encounters:  09/08/23 38.6 kg    Ideal Body Weight:  50 kg  BMI:  Body mass index is 15.56 kg/m.  Estimated Nutritional Needs:   Kcal:  1550-1750 kcals  Protein:  75-90 g  Fluid:  2L plus additional for high ileostomy output   Romelle Starcher MS, RDN, LDN, CNSC Registered Dietitian 3 Clinical Nutrition RD Inpatient Contact Info in Amion

## 2023-09-09 NOTE — Progress Notes (Signed)
 [facility] Progress Note  Name: [name] MRN: [id number] DOB: [birth year]  Subjective:  She had 950 mL UOP over 3/24 as well as 2 unmeasured urine voids.  She had 3.6 liters ostomy output over 3/24 as well.  She has been on normal saline at 75 ml/hr started yesterday AM.  She was also started on levo and is at 5 mcg/min on my exam.  Team has just ordered blood for today.  Per RN there was discussion of possible TPN if in line with her goals of care   Review of systems:    Limited by confusion - states year is 1954 Denies shortness of breath or chest pain  Denies n/v; RN states taking some PO - just goes right through her -------------------  Background:  [name] is an 68 y.o. 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  06/2023 right-sided PCN  + ureteral stent. The patient went to [facility] for initial consult of CKD when she was found to have critical lab results and was advised to go to ER. Sodium level 117, potassium 6.6, CO2 10, BUN 169, creatinine level 10.15, anion gap 34, hemoglobin 9.  UA turbid with a chronic proteinuria     Intake/Output Summary (Last 24 hours) at 09/09/2023 0610 Last data filed at 09/09/2023 0600 Gross per 24 hour  Intake 3023.59 ml  Output 4575 ml  Net -1551.41 ml    Vitals:  Vitals:   09/09/23 0515 09/09/23 0530 09/09/23 0545 09/09/23 0600  BP:      Pulse: 96 93 91 95  Resp: 17 15 15  (!) 24  Temp:      TempSrc:      SpO2: 98% 98% 100% 100%  Weight:      Height:         Physical Exam:   General thin elderly female in bed  in no acute distress HEENT normocephalic atraumatic extraocular movements intact sclera anicteric Neck supple trachea midline Lungs clear to auscultation bilaterally normal work of breathing at rest on room air Heart tachycardic; S1S2 no rub Abdomen soft nontender nondistended Extremities no edema  Psych normal mood and affect Neuro oriented to person; year is 1954 and then next guess is "[birth year]"; not to location Access RIJ nontunneled dialysis catheter   Medications reviewed   Labs:     Latest Ref Rng & Units 09/09/2023    4:36 AM 09/08/2023    3:55 AM 09/07/2023    4:00 PM  BMP  Glucose 70 - 99 mg/dL 92  295  284   BUN 8 - 23 mg/dL 38  41  31   Creatinine 0.44 - 1.00 mg/dL 1.32  4.40  1.02   Sodium 135 - 145 mmol/L 130  126  126   Potassium 3.5 - 5.1 mmol/L 3.1  4.1  4.1   Chloride 98 - 111 mmol/L 92  84  83   CO2 22 - 32 mmol/L 27  29  30    Calcium 8.9 - 10.3 mg/dL 8.2  8.5  8.8      Assessment/Plan:   # Acute kidney injury   - AKI concerning for obstructive uropathy as  well as ischemic ATN with reduced intravascular volume.  Baseline Cr 0.8- 1.0 06/14/2023.  Patient with history of bilateral hydronephrosis and has right nephrostomy tube.  Now with severe metabolic derangement with hyperkalemia, severe acidosis and uremic. CT AP atrophic LK, no hydro on rt (essentially operating with only the right kidney).  She was started on  CRRT 3/20-3/21 initially low blood flow rate and low dialysate flow rate because of very high BUN and hyponatremia. Note previously on D5W --------------------- - continue isotonic fluids as below for an additional 12 hours - Will reach out to her primary team to discuss.  Per RN, there was discussion of possible TPN if this aligns with goals of care  - No acute indication for dialysis today.  Assess dialysis needs daily   - continue nontunneled dialysis catheter for now - will need to see how she does off of fluids before removing   # Severe hyponatremia,  subacute to chronic:  - noted addition of sodium tablets per nephrology previously - Previously on continuous fluids given high ostomy output.  Fluids were restarted on 3/24 as she was hypotensive and tachycardic - NS at 75 ml/hr x 12 hours   - per charting, mental status improved   # Severe hyperkalemia resolved with RRT and now with hypokalemia for which repletion was ordered per team.  She was ordered 60 meq once - I have reduced this to 40 meq once    # Anion gap metabolic acidosis with AKI resolved   # History of obstructive uropathy, bilateral hydronephrosis but currently only operating with the right kidney; no hydro RK on CT scan of abdomen pelvis  # Normocytic anemia  - no ESA given hx of malignancy  - PRBC's per primary team discretion - see PRBC's were ordered for 3/25 and agree  # Other - vitamin D deficiency - She was started on vitamin D 50,000 units every other day - will space this to weekly dosing  Disposition - continue inpatient monitoring   Estanislado Emms, MD 09/09/2023 6:28 AM

## 2023-09-10 DIAGNOSIS — Z7189 Other specified counseling: Secondary | ICD-10-CM | POA: Diagnosis not present

## 2023-09-10 DIAGNOSIS — N179 Acute kidney failure, unspecified: Secondary | ICD-10-CM | POA: Diagnosis not present

## 2023-09-10 DIAGNOSIS — A419 Sepsis, unspecified organism: Secondary | ICD-10-CM

## 2023-09-10 DIAGNOSIS — Z515 Encounter for palliative care: Secondary | ICD-10-CM | POA: Diagnosis not present

## 2023-09-10 DIAGNOSIS — E875 Hyperkalemia: Secondary | ICD-10-CM | POA: Diagnosis not present

## 2023-09-10 DIAGNOSIS — L8990 Pressure ulcer of unspecified site, unspecified stage: Secondary | ICD-10-CM | POA: Insufficient documentation

## 2023-09-10 DIAGNOSIS — R6521 Severe sepsis with septic shock: Secondary | ICD-10-CM | POA: Diagnosis not present

## 2023-09-10 LAB — RENAL FUNCTION PANEL
Albumin: 2 g/dL — ABNORMAL LOW (ref 3.5–5.0)
Anion gap: 12 (ref 5–15)
BUN: 34 mg/dL — ABNORMAL HIGH (ref 8–23)
CO2: 29 mmol/L (ref 22–32)
Calcium: 8.6 mg/dL — ABNORMAL LOW (ref 8.9–10.3)
Chloride: 93 mmol/L — ABNORMAL LOW (ref 98–111)
Creatinine, Ser: 2.71 mg/dL — ABNORMAL HIGH (ref 0.44–1.00)
GFR, Estimated: 19 mL/min — ABNORMAL LOW (ref >60)
Glucose, Bld: 71 mg/dL (ref 70–99)
Phosphorus: 2.9 mg/dL (ref 2.5–4.6)
Potassium: 2.6 mmol/L — CL (ref 3.5–5.1)
Sodium: 134 mmol/L — ABNORMAL LOW (ref 135–145)

## 2023-09-10 LAB — PANCREATIC ELASTASE, FECAL: Pancreatic Elastase-1, Stool: 46 ug Elast./g — ABNORMAL LOW (ref >200)

## 2023-09-10 LAB — MAGNESIUM: Magnesium: 2 mg/dL (ref 1.7–2.4)

## 2023-09-10 LAB — CBC
HCT: 22.2 % — ABNORMAL LOW (ref 36.0–46.0)
Hemoglobin: 7.9 g/dL — ABNORMAL LOW (ref 12.0–15.0)
MCH: 33.2 pg (ref 26.0–34.0)
MCHC: 35.6 g/dL (ref 30.0–36.0)
MCV: 93.3 fL (ref 80.0–100.0)
Platelets: 133 10*3/uL — ABNORMAL LOW (ref 150–400)
RBC: 2.38 MIL/uL — ABNORMAL LOW (ref 3.87–5.11)
RDW: 17.6 % — ABNORMAL HIGH (ref 11.5–15.5)
WBC: 7.1 10*3/uL (ref 4.0–10.5)
nRBC: 0 % (ref 0.0–0.2)

## 2023-09-10 LAB — TYPE AND SCREEN
ABO/RH(D): A POS
Antibody Screen: NEGATIVE
Unit division: 0

## 2023-09-10 LAB — BPAM RBC
Blood Product Expiration Date: 202504082359
ISSUE DATE / TIME: 202503250641
Unit Type and Rh: 6200

## 2023-09-10 LAB — POTASSIUM: Potassium: 3.4 mmol/L — ABNORMAL LOW (ref 3.5–5.1)

## 2023-09-10 LAB — GLUCOSE, CAPILLARY
Glucose-Capillary: 106 mg/dL — ABNORMAL HIGH (ref 70–99)
Glucose-Capillary: 153 mg/dL — ABNORMAL HIGH (ref 70–99)
Glucose-Capillary: 155 mg/dL — ABNORMAL HIGH (ref 70–99)
Glucose-Capillary: 211 mg/dL — ABNORMAL HIGH (ref 70–99)
Glucose-Capillary: 74 mg/dL (ref 70–99)

## 2023-09-10 MED ORDER — POTASSIUM CHLORIDE CRYS ER 20 MEQ PO TBCR
40.0000 meq | EXTENDED_RELEASE_TABLET | Freq: Once | ORAL | Status: AC
  Administered 2023-09-10: 40 meq via ORAL
  Filled 2023-09-10: qty 2

## 2023-09-10 MED ORDER — MIDODRINE HCL 5 MG PO TABS
15.0000 mg | ORAL_TABLET | Freq: Three times a day (TID) | ORAL | Status: DC
  Administered 2023-09-11 – 2023-09-19 (×23): 15 mg via ORAL
  Filled 2023-09-10 (×23): qty 3

## 2023-09-10 MED ORDER — POTASSIUM CHLORIDE 10 MEQ/50ML IV SOLN
10.0000 meq | INTRAVENOUS | Status: AC
  Administered 2023-09-10 (×2): 10 meq via INTRAVENOUS
  Filled 2023-09-10: qty 50

## 2023-09-10 MED ORDER — LACTATED RINGERS IV SOLN
INTRAVENOUS | Status: AC
Start: 2023-09-10 — End: 2023-09-11

## 2023-09-10 MED ORDER — INSULIN GLARGINE-YFGN 100 UNIT/ML ~~LOC~~ SOLN
7.0000 [IU] | Freq: Every day | SUBCUTANEOUS | Status: DC
  Administered 2023-09-11: 7 [IU] via SUBCUTANEOUS
  Filled 2023-09-10 (×2): qty 0.07

## 2023-09-10 MED ORDER — SODIUM CHLORIDE 0.9 % IV BOLUS
500.0000 mL | Freq: Once | INTRAVENOUS | Status: AC
  Administered 2023-09-10: 500 mL via INTRAVENOUS

## 2023-09-10 NOTE — Progress Notes (Signed)
 NAME:  [name], MRN:  [id number], DOB:  Jan 03, 1956, LOS: 7 ADMISSION DATE:  09/03/2023, CONSULTATION DATE: 09/03/23  REFERRING MD:  ED MD Rubin Payor  CHIEF COMPLAINT:  Abnormal Lab work   History of Present Illness:  Pt is a 68 yr female with a significant past medical history for cervical cancer 2003 s/p hysterectomy, hx of TPN use/malnutrition, right hydronephrosis s/p uretal stent 03/2023 and R nephrostomy tube placed in 04/2023 due to hospitalization at [facility] for obstructive uropathy AKI and BL hydronephrosis complicated by Enterobacter and Candida UTI, rectovaginal s/p anterior resection with colovaginal fistula repair, loop ileostomy with closure in 2021, SBO s/p with lysis of adhesions, recurrent SBO, ileum/cecum resection with ileostomy creation 2024, and recent [facility] hospitalization (2/14-2/23/25) which revealed patient had severe dehydration in the setting of high output and poor oral intake who presents to [facility] ED from [facility] due to abnormal lab work indicating acute renal failure (Cr >10, BUN >169, Anion Gap acidosis, severe hyperkalemia-k>6, and hyponatremia Na 117. Due to severity of acute kidney failure and acidosis, PCCM was consulted for ICU admission and further management along with nephrology consult.  Upon initial assessment, patient sitting up in ED stretcher with husband at beside. Patient with intermittent confusion, and disoriented to situation/time. Therefore husband able to provide and assist with HPI. According to husband, had initial appointment with MD Sandford with [facility] and when lab work came back, husband was advised to take patient to [facility] ED immediately. Per husband, over the last few weeks-patient has had issues with sleeping, more fatigued, and having poor oral intake. Husband endorses that ileostomy and right side nephrostomy tube have had moderate output. Patient states that she feels nauseous and sick on her stomach but no vomiting  currently. Husband denies patient having any chills/fevers, and or sick contacts. Patient tachycardic but normotensive, on room air, O2 sats 99 to 100%,  and malnourished.   Pertinent  Medical History   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   Thyroid disease    Vertigo    Malnutrition   Significant Hospital Events: Including procedures, antibiotic start and stop dates in addition to other pertinent events   3/20 Admit to ICU with Acute Renal Failure with concerns with uretal obstruction  3/21 cont CRRT 3/26 HD cath remains   Interim History / Subjective:  No significant events overnight  Patient and husband-having ongoing discussions about plan of care Off pressors since yesterday 3/25    Objective   Blood pressure (!) 78/59, pulse 67, temperature 98.5 F (36.9 C), temperature source Oral, resp. rate 14, height 5\' 2"  (1.575 m), weight 38.6 kg, SpO2 100%.        Intake/Output Summary (Last 24 hours) at 09/10/2023 1157 Last data filed at 09/10/2023 1100 Gross per 24 hour  Intake 2389.39 ml  Output 5435 ml  Net -3045.61 ml   Filed Weights   09/06/23 0412 09/07/23 0453 09/08/23 0400  Weight: 36.8 kg 38 kg 38.6 kg   Physical exam General: acute on chronically ill appearing older adult female, cachexia, sitting up in ICU bed-no distress HEENT: Normocephalic, PERRLA intact, pink MM, teeth intact  Pulm: clear BL, diminsihed in lower bases, no distress CV: s1,s2, RRR-sinus tach low 100s, no MRG, no JVD  Abs: BS active,  soft, ileostomy, urostomy present- output  Extremities: frail;, weak but able to follow commands  Skin: no rashes GU: deferred   Admission blood culture 3/19- NG final 3/20 blood cultures- NGTD Urine culture-yeast GI panel-normal   Resolved Hospital Problem list   Hyperkalemia, resolved  Assessment &  Plan:  Acute kidney injury due to ischemic ATN in the setting of shock Acute metabolic acidosis Chronic left kidney atrophy, essentially single kidney at this point Hyponatremia P: Continue monitoring need for HD, nephrology following  Nephro-recommend continuing LR infusion, and continue GOC discussions Continue to trend renal function daily  Continue to monitor and optimize electrolytes daily Continue to monitor urine output Continue strict I/Os Continue Adequate renal perfusion  Avoid nephrotoxic agents   Hypokalemia  Hypomagnesemia Hyponatremia  P:  K replaced by elink, repeat k 3.4 from 2.9, mag/phos stable  Continue sodium tablets for hyponatremia per nephro  Will give additional K  Repeat K this afternoon at 1600   Septic shock due to acute urinary tract infection On  zosyn, plan for 7-10 day course due to more complicated anatomy of her urinary tract, duration pending clinical stability.  nephrostomy changed by IR 3/24 NGTD per blood cultures  P:  Continue zosyn for now  Continue to assess urostomy tube site daily for signs of infection  Continue midodrine   Concern for neurophathic pain P: Continue gabapentin   Acute metabolic encephalopathy due to sepsis and uremia P: Continue to avoid sedation medications,  Continue to monitor renal function, need for iHD daily  Continue delirium precautions  Since off pressors, can transfer out of ICU to progressive   SBO s/p with lysis of adhesions Recurrent SBO hx, recurrent SBO, ileum/cecum resection with ileostomy creation 2024 [facility]). Recent admission for severe dehydration and high output  husband wants a second opinion and doesn't agree with her surgeon that reversing her ostomy is ill-advised. Surgery consulted for input [id number] ostomy output as well as -potential for future ostomy reversal. P: Continue IVF, continue imodium, fiber and lamotil added-per surgery  PER CCS- no surgery indicated at this time, patient  is high risk for elective ileostomy reversal  Output goal per ileostomy 500-1L per day, meds can be adjusted to achieve  Continue GOC discussions  Severe malnutrition  Hx of TPN use during previous admissions at [facility] dysphagia diet; encouraging PO intake. She is against the idea of NTG and seems to understand why we need her to get at least Ensure in. Patient and husband still refusing TPN option  P:  Continue GOC discussions with patient and husband, if patient-not wanting to pursue TPN then recommend home hospice-focus on quality of life vs quantity   Prediabetes with hyperglycemia, A1c 5.7%. Less hypoglycemia overnight.  P: Continue SSI  Continue glargine 5 units per day  Continue CBG goal 140-180   Anemia and thrombocytopenia of critical illness; hemoconcentrated at admission Received 1 unit of PRBCs on 3/25  Hgb 7.9 from 6.7  P: Continue to monitor hgb per CBC  Transfuse for hgb <7  Repeat daily    Best Practice (right click and "Reselect all SmartList Selections" daily)   Diet/type: Regular consistency (see orders) DVT prophylaxis prophylactic heparin  Pressure ulcer(s): N/A GI prophylaxis: N/A Lines: Dialysis Catheter and Arterial Line Foley:  N/A Code Status:  full code Last date of multidisciplinary goals of care discussion: 3/26: Patient's husband updated at bedside.  Hazel Sams AGACNP-BC   [facility] 09/10/2023, 12:24 PM  Please see [email or web address] for  pager details.  From 7A-7P if no response, please call [phone]. After hours, please call ELink [phone].

## 2023-09-10 NOTE — Progress Notes (Addendum)
 Daily Progress Note   Patient Name: [name]       Date: 09/10/2023 DOB: [birth year]  Age: 68 y.o. MRN#: [id number] Attending Physician: Steffanie Dunn, DO Primary Care Physician: Patient, [name] Admit Date: 09/03/2023 Length of Stay: 7 days  Reason for Consultation/Follow-up: Establishing goals of care  HPI/Patient Profile:  68 y.o. 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  She presented from outpatient nephrology with critical labs including creatinine of 10.15.  She was admitted on 09/03/2023 with AKI due to ischemic ATN from septic shock, multiple electrolyte abnormalities, septic shock due to urinary tract infection, acute metabolic encephalopathy due to sepsis and uremia, history of recurrent SBO, severe malnutrition, high output ostomy, and others.    Palliative medicine was consulted for GOC conversations.  Subjective:   Subjective: Chart Reviewed. Updates received. Patient Assessed. Created space and opportunity for patient  and family to explore thoughts and feelings regarding current medical situation.  Today's Discussion: Today I saw the patient at bedside, husband present.  Prior to seeing the patient I spoke with Dr. Jena Gauss about the case.  Surgery was turned down ostomy reversal because of the extensive amount of scarring and history of SBO.  They do not feel she would be a candidate for reversal in the future either.  Options at this point would be home with TPN, which patient and family unclear that they would not want.  Possibly home with eating and  consistent IV fluids at home, also presented infection risk along with TPN.  Other than that there is no medical options besides comfort care.  Today met with the patient and her husband.  I the patient asked flatly "am I going to die?".  I explained that we have limited options on how to extend her life at this point, at least not in a way that she would be accepting of.  I shared that 1 option to leave the hospital would be TPN, which they have been cleared I do not want any again agreed.  The other option would be home with a lot of IV fluids but this would also "tie them down" and not allow her to live the previous like she had.  The third option would be comfort care.  I explained comfort care as care where the patient would no longer receive aggressive medical interventions such as continuous vital signs, lab work, radiology testing, or medications not focused on comfort, peace, and dignity. This includes stopping antibiotics and weaning oxygen to room air, as these are generally not accepted as providing comfort but only prolonging the dying process artificially. All care would focus on how the patient is looking and feeling. This would include management of any symptoms that may cause discomfort, pain, shortness of breath/air hunger, increased work of breathing, cough, nausea, agitation/restlessness, anxiety, and/or secretions etc. Symptoms would be managed with medications and other non-pharmacological interventions such as spiritual support if requested, repositioning, music therapy, or therapeutic listening. Family verbalized understanding.  The patient and family asked appropriate questions.  I explained that hospice does not mean "you are dying today" but that you likely have less  than 6 months.  However, I stated that sometimes, though uncommonly, patients can live longer than that.  I shared that medications and care are provided by hospice.  Both patient and husband were tearful.  I think they  seem to grasp the situation.  The patient seemed to oscillate between "I am dying there is nothing I can do" and commenting on how unfair it is to have to die and she does not want to die.  I think her husband has a better understanding of the limitations we are facing.  I shared that there is no decision needed today.  She is received a bed on another unit off the ICU.  I shared that I would come back tomorrow and further discuss.  The patient asked that she can have a real Coca-Cola because the operating soda here is not good.  I ended up finding her a bottle of Coca-Cola and brought to her bedside to provide her some comfort and joy.  I provided emotional and general support through therapeutic listening, empathy, sharing of stories, and other techniques. I answered all questions and addressed all concerns to the best of my ability.  Review of Systems  Respiratory:  Negative for shortness of breath.   Cardiovascular:  Negative for chest pain.  Gastrointestinal:  Positive for nausea (RN went to get Zofran). Negative for abdominal pain and vomiting.  Genitourinary:        Vaginal itching    Objective:   Vital Signs:  BP 97/61   Pulse 70   Temp 98.5 F (36.9 C) (Oral)   Resp 18   Ht 5\' 2"  (1.575 m)   Wt 38.6 kg   SpO2 100%   BMI 15.56 kg/m   Physical Exam Vitals and nursing note reviewed.  Constitutional:      General: She is not in acute distress.    Appearance: She is ill-appearing.  HENT:     Head: Normocephalic and atraumatic.  Cardiovascular:     Rate and Rhythm: Normal rate.  Pulmonary:     Effort: No respiratory distress.  Abdominal:     General: Abdomen is flat. There is no distension.     Palpations: Abdomen is soft.  Neurological:     General: No focal deficit present.     Mental Status: She is alert.  Psychiatric:        Mood and Affect: Mood normal.        Behavior: Behavior normal.     Palliative Assessment/Data: 40%    Existing Vynca/ACP  Documentation: None  Assessment & Plan:   Impression: Present on Admission:  Acute kidney injury (HCC)  68 year old female with acute presentation of chronic comorbidities as described above. The patient has an extensive surgical history as noted above. She has a high output ostomy which they were hoping to get reversed, but surgery here has recommended she return to [facility]. At last visit Dr. [name] at [facility] counseled about high risk and patient was quite hesitant, but Dr. [name] offered to revisit in the future if desired. Family is very very reluctant to accept TPN, very against long-term TPN especially. They are hoping to slow down ostomy output, allow her to be out of the hospital, and eventually get ostomy reversed. Later discussions focused on available options (TPN, po intake with consistent IVF at home, versus comfort care). Overall prognosis poor   SUMMARY OF RECOMMENDATIONS   Full Code Full Scope of care Ongoing GOC conversations around aggressive care vs comfort care/hospice  Palliative medicine will continue to follow  Symptom Management:  Per primary team PMT is available to assist as needed  Code Status: Full code  Prognosis: Unable to determine  Discharge Planning: To Be Determined  Discussed with: Patient, family, medical team, nursing team  Thank you for allowing [place] to participate in the care of AIYANAH KALAMA PMT will continue to support holistically.  Time Total: 74 min  Detailed review of medical records (labs, imaging, vital signs), medically appropriate exam, discussed with treatment team, counseling and education to patient, family, & staff, documenting clinical information, medication management, coordination of care  Wynne Dust, NP Palliative Medicine Team  Team Phone # [phone] (Nights/Weekends)  02/13/2021, 8:17 AM

## 2023-09-10 NOTE — TOC Progression Note (Signed)
 Transition of Care [facility]) - Progression Note    Patient Details  Name: [name] MRN: [id number] Date of Birth: 07/07/1955  [facility] CM/SW Contact  Delilah Shan, LCSWA Phone Number: 09/10/2023, 4:04 PM  Clinical Narrative:     Bed offer pending for [facility]. CSW Lvm for patients daughter Sue Lush. CSW awaiting call back. CSW will continue to follow.   Expected Discharge Plan: Skilled Nursing Facility Barriers to Discharge: Continued Medical Work up  Expected Discharge Plan and Services       Living arrangements for the past 2 months: Single Family Home                                       Social Determinants of Health (SDOH) Interventions SDOH Screenings   Food Insecurity: No Food Insecurity (09/04/2023)  Housing: Low Risk  (09/04/2023)  Transportation Needs: No Transportation Needs (09/04/2023)  Utilities: Not At Risk (09/04/2023)  Depression (PHQ2-9): Low Risk  (12/06/2022)  Financial Resource Strain: Low Risk  (08/11/2023)   Received from [facility]  Social Connections: Moderately Isolated (09/04/2023)  Stress: No Stress Concern Present (11/06/2022)  Tobacco Use: Medium Risk (09/04/2023)    Readmission Risk Interventions    06/24/2023    2:33 PM 11/21/2022   10:10 AM 11/18/2022    2:11 PM  Readmission Risk Prevention Plan  Post Dischage Appt   Complete  Medication Screening   Complete  Transportation Screening Complete Complete Complete  PCP or Specialist Appt within 3-5 Days Complete    HRI or Home Care Consult Complete Complete   Social Work Consult for Recovery Care Planning/Counseling Complete Complete   Palliative Care Screening Not Applicable Not Applicable   Medication Review Oceanographer) Complete Complete

## 2023-09-10 NOTE — Progress Notes (Signed)
 [facility] Progress Note  Name: [name] MRN: [id number] DOB: 1955/09/23  Subjective:  She had 800 mL uop over 3/25 as well as 5 unmeasured urine voids.  She had 5.1 liters ostomy output over 3/25 as well.  She has been on normal saline at 75 ml/hr.  I discussed with team on 3/25 and they have discussed TPN with her husband and he has been against this.  She is not on levo anymore.   Review of systems:     Limited by confusion Denies shortness of breath or chest pain  Denies n/v; per her nurse she has had a regular diet and has been snacking overnight - just goes right through her, though  -------------------  Background:  [name] is an 68 y.o. 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  06/2023 right-sided PCN  + ureteral stent. The patient went to [facility] for initial consult of CKD when she was found to have critical lab results and was advised to go to ER. Sodium level 117, potassium 6.6, CO2 10, BUN 169, creatinine level 10.15, anion gap 34, hemoglobin 9.  UA turbid with a chronic proteinuria     Intake/Output Summary (Last 24 hours) at 09/10/2023 0558 Last data filed at 09/10/2023 0400 Gross per 24 hour  Intake 2548.27 ml  Output 5860 ml  Net -3311.73 ml    Vitals:  Vitals:   09/10/23 0445 09/10/23 0500 09/10/23 0514 09/10/23 0515  BP:      Pulse: (!) 59 62 77 75  Resp: 17 20 14 13   Temp:      TempSrc:      SpO2: 99% 100% 100% 100%  Weight:      Height:         Physical Exam:   General thin elderly female in bed in no acute distress HEENT  normocephalic atraumatic extraocular movements intact sclera anicteric Neck supple trachea midline Lungs clear to auscultation bilaterally normal work of breathing at rest on room air Heart tachycardic; S1S2 no rub Abdomen soft nontender nondistended Extremities no edema  Psych normal mood and affect Neuro oriented to person; she doesn't provide a year and states location is "[place]" Access RIJ nontunneled dialysis catheter   Medications reviewed   Labs:     Latest Ref Rng & Units 09/10/2023    4:04 AM 09/09/2023    4:36 AM 09/08/2023    3:55 AM  BMP  Glucose 70 - 99 mg/dL 71  92  782   BUN 8 - 23 mg/dL 34  38  41   Creatinine 0.44 - 1.00 mg/dL 9.56  2.13  0.86   Sodium 135 - 145 mmol/L 134  130  126   Potassium 3.5 - 5.1 mmol/L 2.6  3.1  4.1   Chloride 98 - 111 mmol/L 93  92  84   CO2 22 - 32 mmol/L 29  27  29    Calcium 8.9 - 10.3 mg/dL 8.6  8.2  8.5      Assessment/Plan:   # Acute kidney injury   - AKI concerning for obstructive uropathy as well as ischemic ATN with reduced intravascular volume.  Baseline  Cr 0.8- 1.0 06/14/2023.  Patient with history of bilateral hydronephrosis and has right nephrostomy tube.  Now with severe metabolic derangement with hyperkalemia, severe acidosis and uremic. CT AP atrophic LK, no hydro on rt (essentially operating with only the right kidney).  She was started on  CRRT 3/20-3/21 initially low blood flow rate and low dialysate flow rate because of very high BUN and hyponatremia. Note previously on D5W (perhaps to avoid rapid correction) --------------------- - continue isotonic fluids - switch to LR - Recommend ongoing goals of care discussions.  As long has her high output continues from ostomy, she has an ongoing renal insult.  Her family has declined TPN thus far and she is altered.  Concerned that she will not be able to maintain adequate nutrition.   - No acute indication for dialysis today.  Assess dialysis needs daily   - continue  nontunneled dialysis catheter for now - will need to see how she does off of fluids before removing and with her high gut losses we cannot stop the fluids today   # Severe hyponatremia, subacute to chronic:  - noted addition of sodium tablets per nephrology previously - Previously on continuous fluids given high ostomy output.  Fluids were restarted on 3/24 as she was hypotensive and tachycardic - Fluids as above - per charting, mental status improved   # Hypokalemia.  Hx of severe hyperkalemia resolved with RRT and now with hypokalemia  - change to LR   - repletion was ordered by overnight CCM - agree  - repeat potassium at 10 am     # Anion gap metabolic acidosis with AKI resolved   # History of obstructive uropathy, bilateral hydronephrosis but currently only operating with the right kidney; no hydro RK on CT scan of abdomen pelvis  # Normocytic anemia  - no ESA given hx of malignancy  - PRBC's per primary team discretion, rec'd on 3/25  # Vitamin D deficiency - Noted she was started on vitamin D 50,000 units every other day - I reduced this to weekly dosing  Disposition - continue inpatient monitoring   Estanislado Emms, MD 09/10/2023 6:13 AM

## 2023-09-10 NOTE — Progress Notes (Signed)
 Pharmacy Antibiotic Note  [name] is a 68 y.o. female admitted on 09/03/2023 with  acute renal failure Pharmacy has been consulted for Vancomycin/Zosyn dosing for rule out sepsis. Suspected pyelonephritis > nephrostomy tube changed 3/24  Off norepinephrine > midodrine 10mg  tid  wbc wnl afebrile   Plan: Continue Zosyn to 2.25g IV q 8 hrs disucc LOT with CCM - possible 10day if clinically improved  Vancomycin stopped 3/22   Height: 5\' 2"  (157.5 cm) Weight: 38.6 kg (85 lb 1.6 oz) IBW/kg (Calculated) : 50.1  Temp (24hrs), Avg:98.4 F (36.9 C), Min:97.9 F (36.6 C), Max:99.6 F (37.6 C)  Recent Labs  Lab 09/03/23 2031 09/03/23 2220 09/04/23 0001 09/04/23 0620 09/04/23 1818 09/04/23 2025 09/04/23 2100 09/05/23 0307 09/05/23 0822 09/05/23 0841 09/05/23 1315 09/05/23 1700 09/06/23 0356 09/06/23 1634 09/07/23 0409 09/07/23 1600 09/08/23 0355 09/09/23 0436 09/10/23 0404  WBC  --    < > 11.9*  --   --    < >  --    < > 8.7  --  9.8  --  11.6*  --   --   --   --  13.9* 7.1  CREATININE 10.34*  --  10.11*   < >  --   --   --    < > 2.95*  --  2.55*   < > 2.91*  2.88*   < > 3.13* 3.08* 3.33* 3.02* 2.71*  LATICACIDVEN 3.6*  --  1.8  --  6.6*  --  4.0*  --   --  2.9*  --   --   --   --   --   --   --   --   --    < > = values in this interval not displayed.    Estimated Creatinine Clearance: 12.3 mL/min (A) (by C-G formula based on SCr of 2.71 mg/dL (H)).    Allergies  Allergen Reactions   Barium-Containing Compounds     hives   Percocet [Oxycodone-Acetaminophen] Itching and Other (See Comments)    migraine    Fluconazole 3/19 >> 3/20 Zosyn 3/20 >> Vanc 3/20>>   3/19 BCx > ngtd 3/20 BCx x 2 > ngtd 3/21 UCx > reincubated MRSA PCR 3/19: neg    Leota Sauers Pharm.D. CPP, BCPS Clinical Pharmacist [phone] 09/10/2023 1:10 PM

## 2023-09-11 DIAGNOSIS — D649 Anemia, unspecified: Secondary | ICD-10-CM | POA: Diagnosis not present

## 2023-09-11 DIAGNOSIS — E43 Unspecified severe protein-calorie malnutrition: Secondary | ICD-10-CM | POA: Diagnosis not present

## 2023-09-11 DIAGNOSIS — I959 Hypotension, unspecified: Secondary | ICD-10-CM

## 2023-09-11 DIAGNOSIS — N179 Acute kidney failure, unspecified: Secondary | ICD-10-CM | POA: Diagnosis not present

## 2023-09-11 DIAGNOSIS — Z7189 Other specified counseling: Secondary | ICD-10-CM | POA: Diagnosis not present

## 2023-09-11 DIAGNOSIS — Z515 Encounter for palliative care: Secondary | ICD-10-CM | POA: Diagnosis not present

## 2023-09-11 LAB — RENAL FUNCTION PANEL
Albumin: 1.9 g/dL — ABNORMAL LOW (ref 3.5–5.0)
Anion gap: 9 (ref 5–15)
BUN: 31 mg/dL — ABNORMAL HIGH (ref 8–23)
CO2: 34 mmol/L — ABNORMAL HIGH (ref 22–32)
Calcium: 8.5 mg/dL — ABNORMAL LOW (ref 8.9–10.3)
Chloride: 92 mmol/L — ABNORMAL LOW (ref 98–111)
Creatinine, Ser: 2.42 mg/dL — ABNORMAL HIGH (ref 0.44–1.00)
GFR, Estimated: 21 mL/min — ABNORMAL LOW (ref >60)
Glucose, Bld: 74 mg/dL (ref 70–99)
Phosphorus: 2.8 mg/dL (ref 2.5–4.6)
Potassium: 3.1 mmol/L — ABNORMAL LOW (ref 3.5–5.1)
Sodium: 135 mmol/L (ref 135–145)

## 2023-09-11 LAB — POTASSIUM: Potassium: 3 mmol/L — ABNORMAL LOW (ref 3.5–5.1)

## 2023-09-11 LAB — GLUCOSE, CAPILLARY
Glucose-Capillary: 80 mg/dL (ref 70–99)
Glucose-Capillary: 115 mg/dL — ABNORMAL HIGH (ref 70–99)
Glucose-Capillary: 275 mg/dL — ABNORMAL HIGH (ref 70–99)
Glucose-Capillary: 78 mg/dL (ref 70–99)
Glucose-Capillary: 134 mg/dL — ABNORMAL HIGH (ref 70–99)

## 2023-09-11 LAB — MAGNESIUM: Magnesium: 1.4 mg/dL — ABNORMAL LOW (ref 1.7–2.4)

## 2023-09-11 MED ORDER — MAGNESIUM SULFATE 2 GM/50ML IV SOLN
2.0000 g | Freq: Once | INTRAVENOUS | Status: AC
  Administered 2023-09-11: 2 g via INTRAVENOUS
  Filled 2023-09-11: qty 50

## 2023-09-11 MED ORDER — POTASSIUM CHLORIDE CRYS ER 20 MEQ PO TBCR
40.0000 meq | EXTENDED_RELEASE_TABLET | ORAL | Status: AC
  Administered 2023-09-11: 40 meq via ORAL
  Filled 2023-09-11: qty 2

## 2023-09-11 MED ORDER — POTASSIUM CHLORIDE CRYS ER 20 MEQ PO TBCR
40.0000 meq | EXTENDED_RELEASE_TABLET | Freq: Once | ORAL | Status: AC
  Administered 2023-09-11: 40 meq via ORAL
  Filled 2023-09-11: qty 2

## 2023-09-11 MED ORDER — MAGNESIUM SULFATE IN D5W 1-5 GM/100ML-% IV SOLN
1.0000 g | INTRAVENOUS | Status: AC
  Administered 2023-09-11: 1 g via INTRAVENOUS
  Filled 2023-09-11: qty 100

## 2023-09-11 MED ORDER — LACTATED RINGERS IV SOLN: INTRAVENOUS | Status: DC

## 2023-09-11 NOTE — Progress Notes (Signed)
 Speech Language Pathology Treatment: Cognitive-Linquistic  Patient Details Name: [name] MRN: [id number] DOB: [birth year] Today's Date: 09/11/2023 Time: 4132-4401 SLP Time Calculation (min) (ACUTE ONLY): 29 min  Assessment / Plan / Recommendation Clinical Impression  Pt seen for cognitive/linguistic f/u session with improved auditory processing observed as pt was able to answer personal information quickly and appeared min irritated when asked information stating "I keep telling everyone this information over and over." Observed pt with lunch tray/medication administration with min decreased sustained attention impacting intake with min cues provided verbally for redirection/safety awareness. Pt answered questions/conveyed information via simple conversation with 70% accuracy impacted by pt declining/refusing to answer certain questions during session. Pt unable to problem solve functional tasks with 60% accuracy noted, but improved to 80% with min-mod verbal/visual cues. Baseline cognition likely impacting current status. Decreased awareness of deficits/safety awareness intermittently observed during session. Next venue of care TBD and discussed with husband/pt at conclusion of session with concerns voiced re: return to home vs rehab facility need. ST will continue to f/u in acute setting for cognitive/linguistic tx.   HPI HPI: Pt is a 68 yr female with a significant past medical history for cervical cancer 2003 s/p hysterectomy, hx of TPN use/malnutrition, right hydronephrosis s/p uretal stent 03/2023 and R nephrostomy tube placed in 04/2023 due to hospitalization at [facility] for obstructive uropathy AKI and BL hydronephrosis complicated by Enterobacter and Candida UTI, rectovaginal s/p anterior resection with colovaginal fistula repair, loop ileostomy with closure in 2021, SBO s/p with lysis of adhesions, recurrent SBO, ileum/cecum resection with ileostomy creation 2024, and recent [facility] hospitalization (2/14-2/23/25) which revealed patient had severe dehydration in the setting of high output and poor oral intake who presents to [facility] ED from [facility] on 09/03/23 due to abnormal lab work indicating acute renal failure. Pt admitted with altered mental status; Speech/language cognitive assessment completed with f/u cog/linguistic tx recommended.  ST s/o for swallowing tx.      SLP Plan  Other (Comment) (TBD)      Recommendations for follow up therapy are one component of a multi-disciplinary discharge planning process, led by the attending physician.  Recommendations may be updated based on patient status, additional functional criteria and insurance authorization.    Recommendations                   Other(comment) (TBD)     Frequent or constant Supervision/Assistance Cognitive communication deficit (R41.841)     Other (Comment) (TBD)     Pat Ragina Fenter,M.S.,CCC-SLP  09/11/2023, 1:38 PM

## 2023-09-11 NOTE — Progress Notes (Signed)
 Occupational Therapy Treatment Patient Details Name: [name] MRN: [id number] DOB: [birth year] Today's Date: 09/11/2023   History of present illness 68 y.o. female presents to [facility] 09/03/23 from [facility] due to abnormal lab work. Pt admitted with acute kidney failure, acidosis, and acute encephalopathy. Suspected uretal obstruction vs. Severe dehydration vs. Urosepsis etiology. 3/20 began CRRT. PMHx: atrophic kidney, recurrent AKI, cervical cancer, colovaginal fistula, pyelonephritis, renal atrophy, vertigo   OT comments  OT session focused on training in techniques for increased safety and independence with ADLs and addressing pt ability to follow 1 and 2-step commands and sequence familiar tasks. Pt currently demonstrates ability to largely complete UB ADLs with Set up to Min assist, LB ADLs with Mod to Total assist, bed mobility in preparation for functional tasks with Min assist, and functional transfers with a RW with Min assist +2 for safety. Pt presents with increased activity tolerance, decreased confusion, and improved ability to follow 1-step commands consistently as compared to OT eval. Pt continuing to present with cognitive deficits and requiring cues for sequencing, safety, and technique throughout tasks. Pt participated well in session and is making progress toward goals. Pt will benefit from continued acute skilled OT services to address deficits outlined below and increase safety and independence with functional tasks. Post acute discharge, pt will benefit from intensive inpatient skilled rehab services < 3 hours per day to maximize rehab potential.       If plan is discharge home, recommend the following:  Two people to help with walking and/or transfers;A lot of help with bathing/dressing/bathroom;Assistance with cooking/housework;Direct supervision/assist for medications management;Direct supervision/assist for financial management;Assist for transportation;Help with stairs  or ramp for entrance;Supervision due to cognitive status   Equipment Recommendations  BSC/3in1    Recommendations for Other Services      Precautions / Restrictions Precautions Precautions: Fall Recall of Precautions/Restrictions: Impaired Precaution/Restrictions Comments: R nephrostomy, R UQ ileostomy Restrictions Weight Bearing Restrictions Per Provider Order: No       Mobility Bed Mobility Overal bed mobility: Needs Assistance Bed Mobility: Supine to Sit     Supine to sit: Min assist, HOB elevated     General bed mobility comments: Pt requiring assist to elevate trunk and required minA at pelvis via bed pad to pivot and scoot fwd to EOB til feet supported.    Transfers Overall transfer level: Needs assistance Equipment used: Rolling walker (2 wheels) Transfers: Sit to/from Stand, Bed to chair/wheelchair/BSC Sit to Stand: Min assist, +2 safety/equipment     Step pivot transfers: Min assist, +2 safety/equipment     General transfer comment: Pt requiring cues for safety, sequencing, and hand placement/technique throughout.     Balance Overall balance assessment: Needs assistance Sitting-balance support: No upper extremity supported, Feet supported Sitting balance-Leahy Scale: Fair Sitting balance - Comments: Pt sat EOB with Supervision.   Standing balance support: Bilateral upper extremity supported, During functional activity, Reliant on assistive device for balance Standing balance-Leahy Scale: Poor Standing balance comment: Pt dependent on RW for stability and minA for safety.                           ADL either performed or assessed with clinical judgement   ADL Overall ADL's : Needs assistance/impaired Eating/Feeding: Set up;Sitting               Upper Body Dressing : Minimal assistance;Cueing for sequencing;Cueing for compensatory techniques;Sitting   Lower Body Dressing: Maximal assistance;Cueing for safety;Cueing for  sequencing;Cueing for compensatory techniques;Sitting/lateral leans Lower Body Dressing Details (indicate cue type and reason): to donn socks Toilet Transfer: Minimal assistance;+2 for safety/equipment;Ambulation;BSC/3in1;Rolling walker (2 wheels) Toilet Transfer Details (indicate cue type and reason): simulated to recliner Toileting- Clothing Manipulation and Hygiene: Total assistance;Bed level (ileostomy, nephrostomy)       Functional mobility during ADLs: Minimal assistance;+2 for safety/equipment;Cueing for safety;Cueing for sequencing;Rolling walker (2 wheels) General ADL Comments: Pt with improved activity tolerance noted this session as compared to OT eval but with continued overall decreased activity tolerance. Pt requiring cues for sequencing, safety, and technique throughout tasks. Pt also requiring occasional cues to maintain attention to task.    Extremity/Trunk Assessment Upper Extremity Assessment Upper Extremity Assessment: Right hand dominant (Gross B UE strength 4- to 4/5)   Lower Extremity Assessment Lower Extremity Assessment: Defer to PT evaluation        Vision   Vision Assessment?: No apparent visual deficits   Perception     Praxis     Communication Communication Communication: No apparent difficulties   Cognition Arousal: Alert Behavior During Therapy: WFL for tasks assessed/performed Cognition: Cognition impaired     Awareness: Intellectual awareness impaired, Online awareness impaired (Pt continues to report confusion over "why I need so many cords" even after education.) Memory impairment (select all impairments): Short-term memory, Working [employer] fast streamer, Conservation officer, historic buildings Attention impairment (select first level of impairment): Selective attention Executive functioning impairment (select all impairments): Organization, Sequencing, Reasoning, Problem solving OT - Cognition Comments: Pt with decreased confusion as compared to OT eval and AAOx4  this session. Pt pleasant throughout session with cognitive deficits noted above.                 Following commands: Impaired Following commands impaired: Only follows one step commands consistently, Follows one step commands with increased time, Follows multi-step commands inconsistently, Follows multi-step commands with increased time      Cueing   Cueing Techniques: Verbal cues, Tactile cues, Visual cues  Exercises      Shoulder Instructions       General Comments HR in the 60s to 90s and O2 sat >/95% on RA throughout session. Pt's husband present throughout session    Pertinent Vitals/ Pain       Pain Assessment Pain Assessment: Faces Faces Pain Scale: Hurts a little bit Pain Location: neck at central line site; B LE in standing/ambulating Pain Descriptors / Indicators: Aching, Discomfort Pain Intervention(s): Limited activity within patient's tolerance, Monitored during session, Repositioned  Home Living                                          Prior Functioning/Environment              Frequency  Min 2X/week        Progress Toward Goals  OT Goals(current goals can now be found in the care plan section)  Progress towards OT goals: Progressing toward goals  Acute Rehab OT Goals Patient Stated Goal: to not have as many telemetry lines, be able to walk, and to return home with husband  Plan      Co-evaluation    PT/OT/SLP Co-Evaluation/Treatment: Yes Reason for Co-Treatment: To address functional/ADL transfers   OT goals addressed during session: ADL's and self-care;Other (comment) (Cognition)      AM-PAC OT "6 Clicks" Daily Activity     Outcome Measure   Help from  another person eating meals?: A Little Help from another person taking care of personal grooming?: A Little Help from another person toileting, which includes using toliet, bedpan, or urinal?: Total (ileostomy and nephrostomy) Help from another person bathing  (including washing, rinsing, drying)?: A Lot Help from another person to put on and taking off regular upper body clothing?: A Little Help from another person to put on and taking off regular lower body clothing?: A Lot 6 Click Score: 14    End of Session Equipment Utilized During Treatment: Rolling walker (2 wheels);Gait belt  OT Visit Diagnosis: Other abnormalities of gait and mobility (R26.89);Other symptoms and signs involving cognitive function   Activity Tolerance Patient tolerated treatment well   Patient Left in chair;with call bell/phone within reach;with chair alarm set;with family/visitor present   Nurse Communication Mobility status        Time: 1610-9604 OT Time Calculation (min): 29 min  Charges: OT General Charges $OT Visit: 1 Visit OT Treatments $Self Care/Home Management : 8-22 mins  Tawanda Schall "Orson Eva., OTR/L, MA Acute Rehab [phone]  Lendon Colonel 09/11/2023, 5:06 PM

## 2023-09-11 NOTE — Consult Note (Signed)
 [facility] Liaison Consult Note   09/11/2023  [name] Jul 29, 1955 [id number]  Insurance:  [employer]  Primary Care Provider: Patient, [name]   [facility] Liaison reviewed patient for transition from ICU to Progressive Care unit    The patient was screened for 7-day hospitalization with noted extreme high risk score for unplanned readmission risk 2 hospital admissions in 6 months.  The patient was assessed for potential [facility] Coordination service needs for post hospital transition for care coordination. Review of patient's electronic medical record reveals patient VBCI encounter reveal patient declined VBCI and seeking PCP elsewhere  Plan: [facility] Liaison will continue to follow progress and disposition to assess for post hospital community care coordination/management needs.  Referral request for community care coordination: Follow progress and to check about PCP.   VBCI Community Care, Population Health does not replace or interfere with any arrangements made by the Inpatient Transition of Care team.   For questions contact:   Charlesetta Shanks, RN, BSN, CCM [place]  [facility] Liaison Direct Dial: [phone] or secure chat Email: [email or web address]

## 2023-09-11 NOTE — Progress Notes (Signed)
 Notified Dr. Janalyn Shy of K 3.1 and mg 1.4. Awaiting new orders.

## 2023-09-11 NOTE — Progress Notes (Signed)
 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 Due to severity of acute kidney failure and acidosis, PCCM was consulted for ICU admission and further management along with nephrology consult.   Consultants: Critical care medicine.  Nephrology  Procedures: CRRT while in ICU.  Hemodialysis catheter placement.    Subjective/Interval History: Patient eating breakfast this morning.  Understands her predicament with respect to high output ostomy which leads to renal failure due to hypovolemia.  Denies any pain this morning.    Assessment/Plan:  Acute kidney injury secondary to ATN in the setting of shock/acute metabolic acidosis/electrolyte abnormalities with hypokalemia hypomagnesemia and hyponatremia Presented with creatinine of 10 and a potassium of 6.6. Was seen by nephrology.  HD catheter was placed and patient underwent CRRT. Creatinine has been gradually improving and noted to be 2.42. Nephrology continues to  follow. Her high output from ostomy predisposes her to hypovolemia leading to renal failure. Sodium level has improved.  Noted to be on salt tablets.   Noted to be hypokalemic this morning.  Magnesium level is 1.4 as well.  Will let nephrology address these. Remains on IV fluids.  Septic shock due to urinary tract infection/chronic hypotension Noted to be on Zosyn.  No positive cultures noted.  However she does have a complicated urinary tract with presence of gastrostomy tube. Will need Zosyn for at least 7 to 10 days.  Looks like Zosyn was initiated on 3/22.  Prior to that she was on ceftriaxone. Blood pressures have improved.  She is noted to be on midodrine.  Continue to monitor.  Acute metabolic encephalopathy This is in the setting of electrolyte abnormalities and renal failure.  Gradually improving.  No focal deficits.  History of small bowel obstruction with recurrent SBO's Has had multiple surgeries previously mostly done at [facility].  Currently has ileostomy. Surgeon Marilynne Drivers has said that there was no role for reversing her ileostomy.  Surgery here was consulted since has been wanted another opinion.  They also do not plan any surgical interventions at this time.  Ileostomy reversal will start to be high risk.  High output ostomy This is a main reason why she became hypovolemic and went into renal failure. Noted to be on colestyramine, Lomotil and Imodium, FiberCon. No significant improvement in output noted.  Right nephrostomy [facility].  This was changed by IR on 3/24.    Severe malnutrition/use of TPN during previous hospitalizations at [facility] Against the use of NG tube and has been refusing TNF as well. Palliative care was consulted.  Normocytic anemia and mild thrombocytopenia No evidence of overt bleeding.  Monitor counts periodically.  History of neuropathic pain Noted to be on gabapentin.  Hypothyroidism Continue  levothyroxine.   DVT Prophylaxis: Subcutaneous heparin Code Status: Full code Family Communication: Discussed with patient.  No family at bedside Disposition Plan: PT and OT evaluation    Medications: Scheduled:  Chlorhexidine Gluconate Cloth  6 each Topical Daily   cholestyramine light  4 g Oral TID   clotrimazole  1 Applicatorful Vaginal QHS   vitamin B-12  1,000 mcg Oral Daily   diphenoxylate-atropine  2 tablet Oral BID   feeding supplement  237 mL Oral TID BM   folic acid  1 mg Oral Daily   gabapentin  100 mg Oral Q8H   heparin  5,000 Units Subcutaneous Q8H   insulin aspart  0-15 Units Subcutaneous Q4H   insulin glargine-yfgn  7 Units Subcutaneous QHS   levothyroxine  25 mcg Oral Q0600   lipase/protease/amylase  24,000 Units Oral TID WC   loperamide  2 mg Oral Q6H   midodrine  15 mg Oral TID WC   multivitamin with minerals  1 tablet Oral Daily   polycarbophil  1,250 mg Oral BID   sodium chloride flush  10-40 mL Intracatheter Q12H   sodium chloride  1 g Oral BID WC   thiamine  100 mg Oral Daily   Vitamin D (Ergocalciferol)  50,000 Units Oral Q7 days   Continuous:  magnesium sulfate bolus IVPB 1 g (09/11/23 0832)   norepinephrine (LEVOPHED) Adult infusion Stopped (09/09/23 1016)   piperacillin-tazobactam (ZOSYN)  IV 2.25 g (09/11/23 0544)   EAV:[id number], ondansetron (ZOFRAN) IV, sodium chloride flush  Antibiotics: Anti-infectives (From admission, onward)    Start     Dose/Rate Route Frequency Ordered Stop   09/06/23 1400  piperacillin-tazobactam (ZOSYN) IVPB 2.25 g        2.25 g 100 mL/hr over 30 Minutes Intravenous Every 8 hours 09/06/23 0940     09/05/23 0100  vancomycin (VANCOREADY) IVPB 500 mg/100 mL  Status:  Discontinued        500 mg 100 mL/hr over 60 Minutes Intravenous Every 24 hours 09/04/23 0128 09/06/23 0920   09/04/23 0000  piperacillin-tazobactam (ZOSYN) IVPB 3.375 g  Status:  Discontinued        3.375 g 100 mL/hr over 30 Minutes Intravenous  Every 6 hours 09/03/23 2307 09/06/23 0940   09/03/23 2330  vancomycin (VANCOCIN) IVPB 1000 mg/200 mL premix        1,000 mg 200 mL/hr over 60 Minutes Intravenous  Once 09/03/23 2307 09/04/23 0215   09/03/23 2200  cefTRIAXone (ROCEPHIN) 1 g in sodium chloride 0.9 % 100 mL IVPB  Status:  Discontinued        1 g 200 mL/hr over 30 Minutes Intravenous Every 24 hours 09/03/23 2121 09/03/23 2249   09/03/23 2200  fluconazole (DIFLUCAN) IVPB 400 mg  Status:  Discontinued        400 mg 100 mL/hr over 120 Minutes Intravenous Every 24 hours 09/03/23 2121 09/04/23 1627       Objective:  Vital Signs  Vitals:   09/10/23 2340 09/11/23 0310 09/11/23 0710 09/11/23 0751  BP: (!) 95/56 (!) 92/55 104/65 90/62  Pulse: 73 79 88 84  Resp: 17 15 13 14   Temp: 97.9 F (36.6 C) 97.7 F (36.5 C) 97.6 F (36.4 C) 97.9 F (36.6 C)  TempSrc: Oral Axillary Oral Oral  SpO2: 100% 97% 99% 100%  Weight:  42.9 kg    Height:        Intake/Output Summary (Last  24 hours) at 09/11/2023 0850 Last data filed at 09/11/2023 0800 Gross per 24 hour  Intake 1866.31 ml  Output 5050 ml  Net -3183.69 ml   Filed Weights   09/07/23 0453 09/08/23 0400 09/11/23 0310  Weight: 38 kg 38.6 kg 42.9 kg    General appearance: Awake alert.  In no distress.  Mildly distracted Resp: Clear to auscultation bilaterally.  Normal effort Cardio: S1-S2 is normal regular.  No S3-S4.  No rubs murmurs or bruit GI: Abdomen is soft.  Nontender nondistended.  Bowel sounds are present normal.  No masses organomegaly.  Ileostomy is noted.  Right-sided nephrostomy is noted. Extremities: No edema.  Physical deconditioning is noted. Neurologic: No focal neurological deficits.    Lab Results:  Data Reviewed: I have personally reviewed following labs and reports of the imaging studies  CBC: Recent Labs  Lab 09/05/23 0822 09/05/23 1315 09/05/23 1700 09/06/23 0356 09/09/23 0436 09/10/23 0404  WBC 8.7 9.8  --  11.6* 13.9* 7.1  HGB 7.0*  7.2* 7.4* 8.0* 6.7* 7.9*  HCT 19.0* 19.9* 20.2* 21.9* 19.0* 22.2*  MCV 93.1 93.9  --  93.6 96.0 93.3  PLT 92* 106*  --  113* 159 133*    Basic Metabolic Panel: Recent Labs  Lab 09/07/23 0409 09/07/23 1600 09/08/23 0355 09/09/23 0436 09/10/23 0404 09/10/23 0956 09/11/23 0425  NA 128* 126* 126* 130* 134*  --  135  K 3.1* 4.1 4.1 3.1* 2.6* 3.4* 3.1*  CL 87* 83* 84* 92* 93*  --  92*  CO2 28 30 29 27 29   --  34*  GLUCOSE 73 156* 167* 92 71  --  74  BUN 31* 31* 41* 38* 34*  --  31*  CREATININE 3.13* 3.08* 3.33* 3.02* 2.71*  --  2.42*  CALCIUM 8.5* 8.8* 8.5* 8.2* 8.6*  --  8.5*  MG 1.4*  --  3.5* 2.1 2.0  --  1.4*  PHOS 2.7 2.5 3.6 2.8 2.9  --  2.8    GFR: Estimated Creatinine Clearance: 15.3 mL/min (A) (by C-G formula based on SCr of 2.42 mg/dL (H)).  Liver Function Tests: Recent Labs  Lab 09/07/23 1600 09/08/23 0355 09/09/23 0436 09/10/23 0404 09/11/23 0425  ALBUMIN 2.6* 2.3* 2.1* 2.0* 1.9*    Coagulation Profile: Recent Labs  Lab 09/05/23 0454  INR 1.3*    CBG: Recent Labs  Lab 09/10/23 1125 09/10/23 1545 09/10/23 2353 09/11/23 0413 09/11/23 0724  GLUCAP 155* 211* 106* 80 115*     Recent Results (from the past 240 hours)  Culture, blood (Routine X 2) w Reflex to ID Panel     Status: None   Collection Time: 09/03/23 10:45 PM   Specimen: BLOOD RIGHT ARM  Result Value Ref Range Status   Specimen Description BLOOD RIGHT ARM  Final   Special Requests   Final    BOTTLES DRAWN AEROBIC AND ANAEROBIC Blood Culture results may not be optimal due to an inadequate volume of blood received in culture bottles   Culture   Final    NO GROWTH 5 DAYS Performed at [facility], [address] [place], [place] [address]    Report Status 09/08/2023 FINAL  Final  MRSA Next Gen by PCR, Nasal     Status: None   Collection Time: 09/03/23 10:51 PM   Specimen: Nasal Mucosa; Nasal Swab  Result Value Ref Range Status   MRSA by PCR Next Gen NOT DETECTED NOT DETECTED  Final    Comment: (NOTE)  The GeneXpert MRSA Assay (FDA approved for NASAL specimens only), is one component of a comprehensive MRSA colonization surveillance program. It is not intended to diagnose MRSA infection nor to guide or monitor treatment for MRSA infections. Test performance is not FDA approved in patients less than 89 years old. Performed at [facility], [address], [place], [place] [address]   Culture, blood (Routine X 2) w Reflex to ID Panel     Status: None   Collection Time: 09/04/23  6:18 AM   Specimen: BLOOD RIGHT HAND  Result Value Ref Range Status   Specimen Description BLOOD RIGHT HAND  Final   Special Requests   Final    BOTTLES DRAWN AEROBIC AND ANAEROBIC Blood Culture adequate volume   Culture   Final    NO GROWTH 5 DAYS Performed at [facility], [address] [place], [place] [address]    Report Status 09/09/2023 FINAL  Final  Culture, blood (Routine X 2) w Reflex to ID Panel     Status: None   Collection Time: 09/04/23  6:21 AM   Specimen: BLOOD RIGHT ARM  Result Value Ref Range Status   Specimen Description BLOOD RIGHT ARM  Final   Special Requests   Final    BOTTLES DRAWN AEROBIC AND ANAEROBIC Blood Culture adequate volume   Culture   Final    NO GROWTH 5 DAYS Performed at [facility], [address], [place], [place] [address]    Report Status 09/09/2023 FINAL  Final  Urine Culture (for pregnant, neutropenic or urologic patients or patients with an indwelling urinary catheter)     Status: Abnormal   Collection Time: 09/05/23  1:15 PM   Specimen: Urine, Catheterized  Result Value Ref Range Status   Specimen Description URINE, CATHETERIZED  Final   Special Requests   Final    NONE Performed at [facility], [address], [place], [place] [address]    Culture 50,000 COLONIES/mL YEAST (A)  Final   Report Status 09/06/2023 FINAL  Final  Gastrointestinal Panel by PCR , Stool     Status: None   Collection Time:  09/06/23  9:26 AM   Specimen: Ileostomy; Stool  Result Value Ref Range Status   Campylobacter species NOT DETECTED NOT DETECTED Final   Plesimonas shigelloides NOT DETECTED NOT DETECTED Final   Salmonella species NOT DETECTED NOT DETECTED Final   Yersinia enterocolitica NOT DETECTED NOT DETECTED Final   Vibrio species NOT DETECTED NOT DETECTED Final   Vibrio cholerae NOT DETECTED NOT DETECTED Final   Enteroaggregative E coli (EAEC) NOT DETECTED NOT DETECTED Final   Enteropathogenic E coli (EPEC) NOT DETECTED NOT DETECTED Final   Enterotoxigenic E coli (ETEC) NOT DETECTED NOT DETECTED Final   Shiga like toxin producing E coli (STEC) NOT DETECTED NOT DETECTED Final   Shigella/Enteroinvasive E coli (EIEC) NOT DETECTED NOT DETECTED Final   Cryptosporidium NOT DETECTED NOT DETECTED Final   Cyclospora cayetanensis NOT DETECTED NOT DETECTED Final   Entamoeba histolytica NOT DETECTED NOT DETECTED Final   Giardia lamblia NOT DETECTED NOT DETECTED Final   Adenovirus F40/41 NOT DETECTED NOT DETECTED Final   Astrovirus NOT DETECTED NOT DETECTED Final   Norovirus GI/GII NOT DETECTED NOT DETECTED Final   Rotavirus A NOT DETECTED NOT DETECTED Final   Sapovirus (I, II, IV, and V) NOT DETECTED NOT DETECTED Final    Comment: Performed at [facility], [address], [place], [place] [address]  Radiology Studies: No results found.     LOS: 8 days   Marrianne Sica [employer] on [email or web address]  09/11/2023, 8:50 AM

## 2023-09-11 NOTE — Progress Notes (Signed)
 [facility] Progress Note  Name: [name] MRN: [id number] DOB: 11-Jul-1955  Subjective:  She was moved to the floor on 6 east.  She had 650 mL UOP over 3/26 as well as 2 unmeasured urine voids.  She had 4.6 liters ostomy output over 3/26 as well.  She has been on LR at 75 ml/hr.  Discussed with pulm this AM.  They have previously discussed TPN with her husband and he has been against this.  He cites her quality of life.  LR is ordered but not hanging.   Review of systems:      Limited by confusion but a little better today Denies shortness of breath or chest pain  Copious GI outputs as above  -------------------  Background:  [name] is an 68 y.o. 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  06/2023 right-sided PCN  + ureteral stent. The patient went to [facility] for initial consult of CKD when she was found to have critical lab results and was advised to go to ER. Sodium level 117, potassium 6.6, CO2 10, BUN 169, creatinine level 10.15, anion gap 34, hemoglobin 9.  UA turbid with a chronic proteinuria     Intake/Output Summary (Last 24 hours) at 09/11/2023 1217 Last data filed at 09/11/2023 1100 Gross per 24 hour  Intake 1405 ml  Output 4915 ml  Net -3510 ml    Vitals:  Vitals:   09/11/23 0710 09/11/23 0751 09/11/23 0800 09/11/23 1100  BP: 104/65 90/62  100/64  Pulse: 88 84  77  Resp: 13 14 12 19   Temp: 97.6 F (36.4 C) 97.9 F (36.6 C)  (!) 97.4 F (36.3 C)  TempSrc: Oral Oral  Oral  SpO2: 99% 100%  99%  Weight:      Height:         Physical Exam:     General thin elderly female in bed in no acute distress HEENT normocephalic atraumatic extraocular movements intact sclera anicteric Neck supple trachea midline Lungs clear to auscultation bilaterally normal work of breathing at rest on room air Heart S1S2 no rub Abdomen soft nontender nondistended Extremities no edema  Psych she is frustrated  Neuro oriented to person; location is "in the hospital watching the world go by"; points to calendar and states it is the 27th Access RIJ nontunneled dialysis catheter   Medications reviewed   Labs:     Latest Ref Rng & Units 09/11/2023    4:25 AM 09/10/2023    9:56 AM 09/10/2023    4:04 AM  BMP  Glucose 70 - 99 mg/dL 74   71   BUN 8 - 23 mg/dL 31   34   Creatinine 7.82 - 1.00 mg/dL 9.56   2.13   Sodium 086 - 145 mmol/L 135   134   Potassium 3.5 - 5.1 mmol/L 3.1  3.4  2.6   Chloride 98 - 111 mmol/L 92   93   CO2 22 - 32 mmol/L 34   29   Calcium 8.9 - 10.3 mg/dL 8.5   8.6      Assessment/Plan:   # Acute kidney injury   - AKI  concerning for obstructive uropathy as well as ischemic ATN with reduced intravascular volume.  Baseline Cr 0.8- 1.0 06/14/2023.  Patient with history of bilateral hydronephrosis and has right nephrostomy tube.  Now with severe metabolic derangement with hyperkalemia, severe acidosis and uremic. CT AP atrophic LK, no hydro on rt (essentially operating with only the right kidney).  She was started on  CRRT 3/20-3/21 initially low blood flow rate and low dialysate flow rate because of very high BUN and hyponatremia. Note previously on D5W (perhaps to avoid rapid correction) --------------------- - continue LR x 24 hours then will see how she does off of fluids again  - Recommend ongoing goals of care discussions.  As long has her high output continues from ostomy, she has an ongoing renal insult.  Her family has declined TPN thus far and she is altered.  Concerned that she will not be able to maintain adequate nutrition.    - No acute indication for dialysis today.  Assess dialysis needs daily   - continue nontunneled dialysis catheter for now - will need to see how she does off of fluids before removing and with her high gut losses we cannot stop the fluids today.  Reassess removal on 3/28   # Severe hyponatremia, subacute to chronic:  - noted addition of sodium tablets per nephrology previously - Previously on continuous fluids given high ostomy output.  Fluids were restarted on 3/24 as she was hypotensive and tachycardic - Fluids as above - per charting, mental status improved   # Hypokalemia.  Hx of severe hyperkalemia resolved with RRT and now with hypokalemia  - continue LR  - got 40 meq potassium this AM; will give another 40 meq PO  - repeat potassium level now  - replete magnesium     # Anion gap metabolic acidosis with AKI resolved   # History of obstructive uropathy, bilateral hydronephrosis but currently only operating with the right kidney; no hydro RK on CT scan of abdomen pelvis  # Normocytic anemia  - no ESA given hx of malignancy  - PRBC's per primary team discretion, rec'd on 3/25 - CBC in AM  # Vitamin D deficiency - Noted she was started on vitamin D 50,000 units every other day - I reduced this to weekly dosing  Disposition - continue inpatient monitoring.  Note goals of care may be changing    Estanislado Emms, MD 09/11/2023 12:51 PM

## 2023-09-11 NOTE — Plan of Care (Signed)
  Problem: Metabolic: Goal: Ability to maintain appropriate glucose levels will improve Outcome: Progressing   Problem: Nutritional: Goal: Maintenance of adequate nutrition will improve Outcome: Progressing   Problem: Clinical Measurements: Goal: Cardiovascular complication will be avoided Outcome: Progressing   Problem: Nutrition: Goal: Adequate nutrition will be maintained Outcome: Progressing   Problem: Coping: Goal: Level of anxiety will decrease Outcome: Progressing

## 2023-09-11 NOTE — Progress Notes (Signed)
 Physical Therapy Treatment Patient Details Name: [name] MRN: [id number] DOB: 1955/12/21 Today's Date: 09/11/2023   History of Present Illness 68 y.o. female presents to [facility] 09/03/23 from [facility] due to abnormal lab work. Pt admitted with acute kidney failure, acidosis, and acute encephalopathy. Suspected uretal obstruction vs. Severe dehydration vs. Urosepsis etiology. 3/20 began CRRT. PMHx: atrophic kidney, recurrent AKI, cervical cancer, colovaginal fistula, pyelonephritis, renal atrophy, vertigo    PT Comments  RN student requesting assistance with getting pt back to bed as she is very uncomfortable sitting up in chair. Pt describes it feels like she is "sitting on a rock", and does not understand why the recliner is so hard. Pt is irritated with every aspect of getting back to bed. PT able to put on some classic rock on the computer which seems to be calming. Pt requires minAx2 for standing and pivoting back to bed and min A for returning LE to bed. Geomat air cushion ordered for improved cushioning when she gets up to chair tomorrow. D/c plans remain appropriate. PT will continue to follow acutely.     If plan is discharge home, recommend the following: Assistance with cooking/housework;Assist for transportation;Help with stairs or ramp for entrance;A little help with walking and/or transfers;A little help with bathing/dressing/bathroom   Can travel by private vehicle     Yes  Equipment Recommendations  None recommended by PT (Pt already has DME)       Precautions / Restrictions Precautions Precautions: Fall Recall of Precautions/Restrictions: Impaired Precaution/Restrictions Comments: R nephrostomy, RUQ ileostomy Restrictions Weight Bearing Restrictions Per Provider Order: No     Mobility  Bed Mobility Overal bed mobility: Needs Assistance Bed Mobility: Sit to Supine       Sit to supine: Min assist   General bed mobility comments: pt reports she does not need  any help with getting back to bed, PT replied that she would just make sure that LE don't fall back out of the bed, pt ultimately requiring min A for final elevation of LE to bring them back into bed    Transfers Overall transfer level: Needs assistance Equipment used: Rolling walker (2 wheels) Transfers: Sit to/from Stand, Bed to chair/wheelchair/BSC Sit to Stand: Min assist, +2 safety/equipment   Step pivot transfers: Min assist, +2 safety/equipment       General transfer comment: pt requiring minAx2 for manaagement of lines, leads and tubes and physical assist to stand and pivot back to the bed       Balance Overall balance assessment: Needs assistance Sitting-balance support: No upper extremity supported, Feet supported Sitting balance-Leahy Scale: Fair Sitting balance - Comments: Pt sat EOB with supervision. pt upset when staff would not let her sit EOB when staff not in room   Standing balance support: Bilateral upper extremity supported, During functional activity, Reliant on assistive device for balance Standing balance-Leahy Scale: Poor Standing balance comment: Pt dependent on RW for stability and minA for safety.                            Communication Communication Communication: No apparent difficulties  Cognition Arousal: Alert Behavior During Therapy: WFL for tasks assessed/performed   PT - Cognitive impairments: Memory, Attention, Initiation, Sequencing, Problem solving, Safety/Judgement                       PT - Cognition Comments: pt requires increased cuing for sequencing. little patience for staff  management of nephrostomy and iliostomy pouches Following commands: Impaired Following commands impaired: Only follows one step commands consistently, Follows one step commands with increased time, Follows multi-step commands inconsistently, Follows multi-step commands with increased time    Cueing Cueing Techniques: Verbal cues, Tactile  cues, Visual cues     General Comments General comments (skin integrity, edema, etc.): RN student request assistance for getting pt back to bed. Pt very upset about sore R sacral region. "It feels like I am sitting on a rock" Pt's husband in room trying to apease her. PT and RN student work on road mapping return to bed to reassure pt that we will get her in a more comfortable position      Pertinent Vitals/Pain Pain Assessment Pain Assessment: Faces Faces Pain Scale: Hurts even more Facial Expression: sad, frightened, frown Body Language: tense, distressed pacing, fidgeting Pain Location: R sacral region with sitting in recliner Pain Descriptors / Indicators: Aching, Discomfort Pain Intervention(s): Limited activity within patient's tolerance, Monitored during session, Repositioned     PT Goals (current goals can now be found in the care plan section) Acute Rehab PT Goals Patient Stated Goal: Feel better PT Goal Formulation: With patient Time For Goal Achievement: 09/18/23 Potential to Achieve Goals: Good Progress towards PT goals: Progressing toward goals    Frequency    Min 2X/week       AM-PAC PT "6 Clicks" Mobility   Outcome Measure  Help needed turning from your back to your side while in a flat bed without using bedrails?: A Little Help needed moving from lying on your back to sitting on the side of a flat bed without using bedrails?: A Little Help needed moving to and from a bed to a chair (including a wheelchair)?: Total Help needed standing up from a chair using your arms (e.g., wheelchair or bedside chair)?: Total Help needed to walk in hospital room?: Total Help needed climbing 3-5 steps with a railing? : Total 6 Click Score: 10    End of Session Equipment Utilized During Treatment: Gait belt Activity Tolerance: Patient tolerated treatment well Patient left: with family/visitor present;in bed;with bed alarm set;with call bell/phone within reach;with  nursing/sitter in room Nurse Communication: Mobility status PT Visit Diagnosis: Other abnormalities of gait and mobility (R26.89);Muscle weakness (generalized) (M62.81)     Time: 1610-9604 PT Time Calculation (min) (ACUTE ONLY): 20 min  Charges:    $Gait Training: 8-22 mins $Therapeutic Activity: 8-22 mins PT General Charges $$ ACUTE PT VISIT: 1 Visit                     Barnet Benavides B. Beverely Risen PT, DPT Acute Rehabilitation Services Please use secure chat or  Call Office [phone]    [facility]on Alas [facility] 09/11/2023, 4:31 PM

## 2023-09-11 NOTE — Progress Notes (Signed)
 Physical Therapy Treatment Patient Details Name: [name] MRN: [id number] DOB: [birth year] Today's Date: 09/11/2023   History of Present Illness 68 y.o. female presents to [facility] 09/03/23 from [facility] due to abnormal lab work. Pt admitted with acute kidney failure, acidosis, and acute encephalopathy. Suspected uretal obstruction vs. Severe dehydration vs. Urosepsis etiology. 3/20 began CRRT. PMHx: atrophic kidney, recurrent AKI, cervical cancer, colovaginal fistula, pyelonephritis, renal atrophy, vertigo    PT Comments  Pt advanced OOB mobility using RW with decreased physical assistance. She performed sit-to-stand, bed>chair, and ambulated ~42ft with minA x2. Pt appears to be less confused this session and benefits from VC throughout to aid in sequencing. Will continue to follow acutely and advance appropriately.      If plan is discharge home, recommend the following: Assistance with cooking/housework;Assist for transportation;Help with stairs or ramp for entrance;A little help with walking and/or transfers;A little help with bathing/dressing/bathroom   Can travel by private vehicle     Yes  Equipment Recommendations  None recommended by PT (Pt already has DME)    Recommendations for Other Services       Precautions / Restrictions Precautions Precautions: Fall Recall of Precautions/Restrictions: Impaired Precaution/Restrictions Comments: R nephrostomy, RUQ ileostomy Restrictions Weight Bearing Restrictions Per Provider Order: No     Mobility  Bed Mobility Overal bed mobility: Needs Assistance Bed Mobility: Supine to Sit     Supine to sit: HOB elevated, Min assist     General bed mobility comments: Pt sat up on L side of the bed, brought BLE off EOB, reached to pull on PT in order to bring trunk upright. She required minA at pelvis via bed pad to pivot and scoot fwd to EOB til feet supported.    Transfers Overall transfer level: Needs assistance Equipment used:  Rolling walker (2 wheels) Transfers: Sit to/from Stand, Bed to chair/wheelchair/BSC Sit to Stand: Min assist, +2 safety/equipment   Step pivot transfers: Min assist, +2 safety/equipment       General transfer comment: Pt stood from lowest bed height and recliner chair. VC for hand positioning and sequencing. She was able to power up with minA. Pt transfered to R taking short steps to reach recliner chair. VC to reach back for the surface she was going to and HHA to guide pt to armrest. Good eccentric control with sitting.    Ambulation/Gait Ambulation/Gait assistance: Min assist, +2 safety/equipment Gait Distance (Feet): 8 Feet (1x3, seated rest, 1x8, seated rest) Assistive device: Rolling walker (2 wheels) Gait Pattern/deviations: Step-to pattern, Decreased stride length, Trunk flexed Gait velocity: reduced Gait velocity interpretation: <1.31 ft/sec, indicative of household ambulator   General Gait Details: Pt ambulated with short, small steps, limited foot clearence, and fwd flex trunk over RW. Pt had difficulty advance BLE secondary to pain/weakness. She required VC throughout to aid with sequencing. Encouraged increased WBing in BUE on RW grips. Pt maintained a fixed gaze on the floor. She was slightly unsteady, but no overt LOB.   Stairs             Wheelchair Mobility     Tilt Bed    Modified Rankin (Stroke Patients Only)       Balance Overall balance assessment: Needs assistance Sitting-balance support: No upper extremity supported, Feet supported Sitting balance-Leahy Scale: Fair Sitting balance - Comments: Pt sat EOB with supervision.   Standing balance support: Bilateral upper extremity supported, During functional activity, Reliant on assistive device for balance Standing balance-Leahy Scale: Poor Standing balance comment: Pt  dependent on RW for stability and minA for safety.                            Communication  Communication Communication: No apparent difficulties  Cognition Arousal: Alert Behavior During Therapy: WFL for tasks assessed/performed   PT - Cognitive impairments: Memory, Attention, Initiation, Sequencing, Problem solving, Safety/Judgement                       PT - Cognition Comments: Pt demonstrated delayed processing and impaired short-term memory. She required frequent VC and re-orientation and re-direction to focus on task. Following commands: Impaired Following commands impaired: Only follows one step commands consistently, Follows one step commands with increased time, Follows multi-step commands inconsistently, Follows multi-step commands with increased time    Cueing Cueing Techniques: Verbal cues, Tactile cues, Visual cues  Exercises      General Comments General comments (skin integrity, edema, etc.): Pt's husband was present throughout session.      Pertinent Vitals/Pain Pain Assessment Pain Assessment: Faces Faces Pain Scale: Hurts a little bit Pain Location: neck at central line site; BLE when WBing Pain Descriptors / Indicators: Aching, Discomfort Pain Intervention(s): Limited activity within patient's tolerance, Monitored during session, Repositioned    Home Living                          Prior Function            PT Goals (current goals can now be found in the care plan section) Acute Rehab PT Goals Patient Stated Goal: Feel better Progress towards PT goals: Progressing toward goals    Frequency    Min 2X/week      PT Plan      Co-evaluation PT/OT/SLP Co-Evaluation/Treatment: Yes Reason for Co-Treatment: To address functional/ADL transfers PT goals addressed during session: Mobility/safety with mobility;Balance        AM-PAC PT "6 Clicks" Mobility   Outcome Measure  Help needed turning from your back to your side while in a flat bed without using bedrails?: A Little Help needed moving from lying on your back to  sitting on the side of a flat bed without using bedrails?: A Little Help needed moving to and from a bed to a chair (including a wheelchair)?: Total Help needed standing up from a chair using your arms (e.g., wheelchair or bedside chair)?: Total Help needed to walk in hospital room?: Total Help needed climbing 3-5 steps with a railing? : Total 6 Click Score: 10    End of Session Equipment Utilized During Treatment: Gait belt Activity Tolerance: Patient tolerated treatment well Patient left: in chair;with call bell/phone within reach;with chair alarm set;with family/visitor present Nurse Communication: Mobility status PT Visit Diagnosis: Other abnormalities of gait and mobility (R26.89);Muscle weakness (generalized) (M62.81)     Time: 1610-9604 PT Time Calculation (min) (ACUTE ONLY): 29 min  Charges:    $Gait Training: 8-22 mins PT General Charges $$ ACUTE PT VISIT: 1 Visit                     Cheri Guppy, PT, DPT Acute Rehabilitation Services Office: [phone] Secure Chat Preferred  Richardson Chiquito 09/11/2023, 1:23 PM

## 2023-09-11 NOTE — TOC Progression Note (Addendum)
 Transition of Care [facility]) - Progression Note    Patient Details  Name: [name] MRN: [id number] Date of Birth: 1956-01-04  [facility] CM/SW Contact  Delilah Shan, LCSWA Phone Number: 09/11/2023, 4:00 PM  Clinical Narrative:     Minerva Areola with palliative informed CSW that spouse would like CSW to follow up with his daughter regarding SNF placement for patient.[facility] unable to offer SNF bed for patient. CSW LVM for patients daughter Sue Lush. CSW awaiting call back to see if in agreement for CSW to fax out further for possible SNF placement for patient.  Update- CSW spoke with patients spouse and informed him that [facility] unable to offer SNF bed for patient. Patients spouse gave CSW permission to fax out initial referral near the [place] area for possible SNF bed for patient. All questions answered. No further questions reported at this time.   Expected Discharge Plan: Skilled Nursing Facility Barriers to Discharge: Continued Medical Work up  Expected Discharge Plan and Services       Living arrangements for the past 2 months: Single Family Home                                       Social Determinants of Health (SDOH) Interventions SDOH Screenings   Food Insecurity: No Food Insecurity (09/04/2023)  Housing: Low Risk  (09/04/2023)  Transportation Needs: No Transportation Needs (09/04/2023)  Utilities: Not At Risk (09/04/2023)  Depression (PHQ2-9): Low Risk  (12/06/2022)  Financial Resource Strain: Low Risk  (08/11/2023)   Received from [facility]  Social Connections: Moderately Isolated (09/04/2023)  Stress: No Stress Concern Present (11/06/2022)  Tobacco Use: Medium Risk (09/04/2023)    Readmission Risk Interventions    06/24/2023    2:33 PM 11/21/2022   10:10 AM 11/18/2022    2:11 PM  Readmission Risk Prevention Plan  Post Dischage Appt   Complete  Medication Screening   Complete  Transportation Screening Complete Complete Complete  PCP or  Specialist Appt within 3-5 Days Complete    HRI or Home Care Consult Complete Complete   Social Work Consult for Recovery Care Planning/Counseling Complete Complete   Palliative Care Screening Not Applicable Not Applicable   Medication Review Oceanographer) Complete Complete

## 2023-09-11 NOTE — Progress Notes (Addendum)
 Daily Progress Note   Patient Name: [name]       Date: 09/11/2023 DOB: 07/17/1955  Age: 68 y.o. MRN#: [id number] Attending Physician: Osvaldo Shipper, MD Primary Care Physician: Patient, [name] Admit Date: 09/03/2023 Length of Stay: 8 days  Reason for Consultation/Follow-up: Establishing goals of care  HPI/Patient Profile:  68 y.o. 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  She presented from outpatient nephrology with critical labs including creatinine of 10.15.  She was admitted on 09/03/2023 with AKI due to ischemic ATN from septic shock, multiple electrolyte abnormalities, septic shock due to urinary tract infection, acute metabolic encephalopathy due to sepsis and uremia, history of recurrent SBO, severe malnutrition, high output ostomy, and others.    Palliative medicine was consulted for GOC conversations.  Subjective:   Subjective: Chart Reviewed. Updates received. Patient Assessed. Created space and opportunity for patient  and family to explore thoughts and feelings regarding current medical situation.  Today's Discussion: Today I saw the patient at bedside, husband present.  I reviewed the chart and saw note from [facility] stating bed offer pending for [facility].  Today met with the patient and her husband.  Today they state that she has had a good day.  She actually got out of bed and walked around the room a little bit.  They wish that therapy would have been able to work with her low bit longer.  We discussed possible SNF/rehab, which per previous  discussions was not an option they were entertaining.  They stated they would be okay with short-term rehab but not at [facility] care, where they previously were.  They mention [facility].  Husband states eventually going home.  It appears they are going to try as much oral intake as possible to see how she does.  However, he did mention that he feels that within less than 6 months she will likely be back in the hospital with malnutrition and dehydration.  I shared that without TPN and fluids this is very likely.  This seemed to upset the patient quite a bit.  After seeing the patient I discussed more with TOC.  They are not sure about SNF/rehab placement.  We also discussed offering outpatient palliative care.  I went back to the room to offer this to the patient's husband and he would like to hold off for today on making that decision.  He asked for TOC to reach out to the patient's daughter on disposition/placement.  I shared that I would be back on Monday and we will check him back pain if there is still in the hospital.  I provided emotional and general support through therapeutic listening, empathy, sharing of stories, and other techniques. I answered all questions and addressed all concerns to the best of my ability.  Review of Systems  Respiratory:  Negative for shortness of breath.   Cardiovascular:  Negative for chest pain.  Gastrointestinal:  Positive for nausea (declines meds at this time). Negative for abdominal pain and vomiting.    Objective:   Vital Signs:  BP 100/64 (BP Location: Right Arm)   Pulse 77   Temp Marland Kitchen)  97.4 F (36.3 C) (Oral)   Resp 19   Ht 5\' 2"  (1.575 m)   Wt 42.9 kg   SpO2 99%   BMI 17.30 kg/m   Physical Exam Vitals and nursing note reviewed.  Constitutional:      General: She is not in acute distress.    Appearance: She is ill-appearing.  HENT:     Head: Normocephalic and atraumatic.  Cardiovascular:     Rate and Rhythm: Normal rate.   Pulmonary:     Effort: No respiratory distress.  Abdominal:     General: Abdomen is flat. There is no distension.     Palpations: Abdomen is soft.  Neurological:     General: No focal deficit present.     Mental Status: She is alert.  Psychiatric:        Mood and Affect: Mood normal.        Behavior: Behavior normal.     Palliative Assessment/Data: 40%    Existing Vynca/ACP Documentation: None  Assessment & Plan:   Impression: Present on Admission:  Acute kidney injury (HCC)  68 year old female with acute presentation of chronic comorbidities as described above. The patient has an extensive surgical history as noted above. She has a high output ostomy which they were hoping to get reversed, but surgery here has recommended she return to [facility]. At last visit Dr. [name] at [facility] counseled about high risk and patient was quite hesitant, but Dr. [name] offered to revisit in the future if desired. Family is very very reluctant to accept TPN, very against long-term TPN especially. They are hoping to slow down ostomy output, allow her to be out of the hospital, and eventually get ostomy reversed. Later discussions focused on available options (TPN, po intake with consistent IVF at home, versus comfort care). Overall prognosis poor   SUMMARY OF RECOMMENDATIONS   Full Code Full Scope of care Ongoing GOC conversations around aggressive care vs comfort care/hospice, appears leaning toward SNF/Rehab Recommended OP Palliative, patient and husband thinking on it Palliative medicine willf ollow-up Monday Please notify us of significant clinical change or new palliative needs before then  Symptom Management:  Per primary team PMT is available to assist as needed  Code Status: Full code  Prognosis: Unable to determine  Discharge Planning: To Be Determined  Discussed with: Patient, family, medical team, nursing team  Thank you for allowing [place] to participate in the care of TOSHI ISHII PMT will continue to support holistically.  Time Total: 68 min  Detailed review of medical records (labs, imaging, vital signs), medically appropriate exam, discussed with treatment team, counseling and education to patient, family, & staff, documenting clinical information, medication management, coordination of care  Wynne Dust, NP Palliative Medicine Team  Team Phone # [phone] (Nights/Weekends)  02/13/2021, 8:17 AM

## 2023-09-12 DIAGNOSIS — N179 Acute kidney failure, unspecified: Secondary | ICD-10-CM | POA: Diagnosis not present

## 2023-09-12 DIAGNOSIS — E876 Hypokalemia: Secondary | ICD-10-CM

## 2023-09-12 DIAGNOSIS — E871 Hypo-osmolality and hyponatremia: Secondary | ICD-10-CM | POA: Diagnosis not present

## 2023-09-12 DIAGNOSIS — D649 Anemia, unspecified: Secondary | ICD-10-CM | POA: Diagnosis not present

## 2023-09-12 LAB — RENAL FUNCTION PANEL
Albumin: 1.9 g/dL — ABNORMAL LOW (ref 3.5–5.0)
Anion gap: 10 (ref 5–15)
BUN: 29 mg/dL — ABNORMAL HIGH (ref 8–23)
CO2: 31 mmol/L (ref 22–32)
Calcium: 8.5 mg/dL — ABNORMAL LOW (ref 8.9–10.3)
Chloride: 92 mmol/L — ABNORMAL LOW (ref 98–111)
Creatinine, Ser: 2.41 mg/dL — ABNORMAL HIGH (ref 0.44–1.00)
GFR, Estimated: 21 mL/min — ABNORMAL LOW (ref >60)
Glucose, Bld: 139 mg/dL — ABNORMAL HIGH (ref 70–99)
Phosphorus: 2.9 mg/dL (ref 2.5–4.6)
Potassium: 3 mmol/L — ABNORMAL LOW (ref 3.5–5.1)
Sodium: 133 mmol/L — ABNORMAL LOW (ref 135–145)

## 2023-09-12 LAB — GLUCOSE, CAPILLARY
Glucose-Capillary: 102 mg/dL — ABNORMAL HIGH (ref 70–99)
Glucose-Capillary: 128 mg/dL — ABNORMAL HIGH (ref 70–99)
Glucose-Capillary: 183 mg/dL — ABNORMAL HIGH (ref 70–99)
Glucose-Capillary: 187 mg/dL — ABNORMAL HIGH (ref 70–99)
Glucose-Capillary: 235 mg/dL — ABNORMAL HIGH (ref 70–99)
Glucose-Capillary: 52 mg/dL — ABNORMAL LOW (ref 70–99)
Glucose-Capillary: 76 mg/dL (ref 70–99)
Glucose-Capillary: 79 mg/dL (ref 70–99)

## 2023-09-12 LAB — CBC
HCT: 21.9 % — ABNORMAL LOW (ref 36.0–46.0)
Hemoglobin: 7.4 g/dL — ABNORMAL LOW (ref 12.0–15.0)
MCH: 32.7 pg (ref 26.0–34.0)
MCHC: 33.8 g/dL (ref 30.0–36.0)
MCV: 96.9 fL (ref 80.0–100.0)
Platelets: 146 10*3/uL — ABNORMAL LOW (ref 150–400)
RBC: 2.26 MIL/uL — ABNORMAL LOW (ref 3.87–5.11)
RDW: 17 % — ABNORMAL HIGH (ref 11.5–15.5)
WBC: 3.9 10*3/uL — ABNORMAL LOW (ref 4.0–10.5)
nRBC: 0 % (ref 0.0–0.2)

## 2023-09-12 LAB — MAGNESIUM: Magnesium: 2.1 mg/dL (ref 1.7–2.4)

## 2023-09-12 MED ORDER — POTASSIUM CHLORIDE CRYS ER 20 MEQ PO TBCR
40.0000 meq | EXTENDED_RELEASE_TABLET | Freq: Once | ORAL | Status: AC
  Administered 2023-09-12: 40 meq via ORAL
  Filled 2023-09-12: qty 2

## 2023-09-12 NOTE — Progress Notes (Signed)
 Mobility Specialist Progress Note;   09/12/23 0915  Mobility  Activity Transferred from bed to chair  Level of Assistance +2 (takes two people) Audiological scientist)  Press photographer wheel walker  Distance Ambulated (ft) 3 ft  Activity Response Tolerated well  Mobility Referral Yes  Mobility visit 1 Mobility  Mobility Specialist Start Time (ACUTE ONLY) 0915  Mobility Specialist Stop Time (ACUTE ONLY) [id number]  Mobility Specialist Time Calculation (min) (ACUTE ONLY) 10 min   Pt agreeable to mobility. Required MinA +2 to safely transfer pt from bed to chair. VC required for step following and safety. VSS throughout and no c/o when asked. Pt left in chair with all needs met, alarm on.   Caesar Bookman Mobility Specialist Please contact via SecureChat or [employer] [phone]

## 2023-09-12 NOTE — Progress Notes (Signed)
 [facility] NEPHROLOGY PROGRESS NOTE  Assessment/ Plan: Pt is a 68 y.o. 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   # Acute kidney injury   - AKI concerning for obstructive uropathy as well as ischemic ATN with reduced intravascular volume.  Baseline Cr 0.8- 1.0 06/14/2023.  Patient with history of bilateral hydronephrosis and has right nephrostomy tube.  Now presented with severe metabolic derangement with hyperkalemia, severe acidosis and uremic. CT AP atrophic LK, no hydro on rt (essentially operating with only the right kidney).  She was started on  CRRT 3/20-3/21 initially low blood flow rate and low dialysate flow rate because of very high BUN and hyponatremia. - Recommend ongoing goals of care discussions.  As long has her high output continues from ostomy, she has an ongoing renal insult.  Her family has declined TPN thus far  - No acute indication for dialysis today.   -The patient is nonoliguric and creatinine level remains stable.  Encourage to increase oral intake and hydration.  No need for dialysis therefore we will remove temporary catheter.  I think her renal function is very sensitive with the fluid with poor renal reserve.  # Severe hyponatremia, subacute to chronic:  - noted addition of sodium tablets per nephrology previously - Previously on continuous fluids given high ostomy output.  Fluids were restarted on 3/24 as she was hypotensive and tachycardic - Fluids as above - per charting, mental status improved   # Hypokalemia.  Hx of severe hyperkalemia resolved  with RRT and now with hypokalemia  - continue to replete potassium.  This is probably due to GI loss. - replete magnesium     # Anion gap metabolic acidosis with AKI resolved   # History of obstructive uropathy, bilateral hydronephrosis but currently only operating with the right kidney; no hydro RK on CT scan of abdomen pelvis   # Normocytic anemia  - no ESA given hx of malignancy  - PRBC's per primary team discretion, rec'd on 3/25.  Discussed with the primary team.  I will sign off, please call us back with question.   Subjective: Seen and examined at the bedside.  Patient is complaining about neck pain where the catheter is and wanted it to come out.  Urine output is increasing.  No nausea, vomiting, chest pain or shortness of breath.  She generally feels tired weak.  Her husband was presented with her. Objective Vital signs in last 24 hours: Vitals:   09/11/23 2320 09/12/23 0400 09/12/23 0722 09/12/23 1143  BP: 95/61 101/72 92/62 98/65   Pulse: 71 62 60 80  Resp:  17 12 16   Temp: 97.6 F (36.4 C) (!) 97.5 F (36.4 C) 98.1 F (36.7 C) 98.6 F (37 C)  TempSrc: Axillary Axillary Oral Oral  SpO2: 99% 100% 100% 98%  Weight:  43.4 kg    Height:       Weight change: 0.5 kg  Intake/Output Summary (Last 24 hours) at 09/12/2023 1355 Last data filed at 09/12/2023 1238 Gross per 24 hour  Intake 2486.27 ml  Output 4065 ml  Net -1578.73 ml       Labs: RENAL PANEL Recent Labs  Lab 09/08/23 0355 09/09/23 0436 09/10/23 0404 09/10/23 0956 09/11/23 0425 09/11/23 1610 09/12/23 0524  NA 126* 130* 134*  --  135  --  133*  K 4.1 3.1* 2.6* 3.4* 3.1* 3.0* 3.0*  CL 84* 92* 93*  --  92*  --  92*  CO2 29 27 29   --  34*  --  31  GLUCOSE 167* 92 71  --  74  --  139*  BUN 41* 38* 34*  --  31*  --  29*  CREATININE 3.33* 3.02* 2.71*  --  2.42*  --  2.41*  CALCIUM 8.5* 8.2* 8.6*  --  8.5*  --  8.5*  MG 3.5* 2.1 2.0  --  1.4*  --  2.1  PHOS 3.6 2.8 2.9  --  2.8  --  2.9  ALBUMIN  2.3* 2.1* 2.0*  --  1.9*  --  1.9*    Liver Function Tests: Recent Labs  Lab 09/10/23 0404 09/11/23 0425 09/12/23 0524  ALBUMIN 2.0* 1.9* 1.9*   No results for input(s): "LIPASE", "AMYLASE" in the last 168 hours. No results for input(s): "AMMONIA" in the last 168 hours. CBC: Recent Labs    06/10/23 0419 06/11/23 0340 09/04/23 1631 09/04/23 2025 09/05/23 1700 09/06/23 0356 09/09/23 0436 09/10/23 0404 09/12/23 0524  HGB 12.2   < >  --    < > 7.4* 8.0* 6.7* 7.9* 7.4*  MCV 87.8   < >  --    < >  --  93.6 96.0 93.3 96.9  VITAMINB12 601  --  336  --   --   --   --   --   --   FOLATE 8.9  --  16.9  --   --   --   --   --   --    < > = values in this interval not displayed.    Cardiac Enzymes: No results for input(s): "CKTOTAL", "CKMB", "CKMBINDEX", "TROPONINI" in the last 168 hours. CBG: Recent Labs  Lab 09/12/23 0001 09/12/23 0402 09/12/23 0425 09/12/23 0721 09/12/23 1142  GLUCAP 102* 52* 79 76 235*    Iron Studies: No results for input(s): "IRON", "TIBC", "TRANSFERRIN", "FERRITIN" in the last 72 hours. Studies/Results: No results found.  Medications: Infusions:  piperacillin-tazobactam (ZOSYN)  IV Stopped (09/12/23 0559)    Scheduled Medications:  Chlorhexidine Gluconate Cloth  6 each Topical Daily   cholestyramine light  4 g Oral TID   clotrimazole  1 Applicatorful Vaginal QHS   vitamin B-12  1,000 mcg Oral Daily   diphenoxylate-atropine  2 tablet Oral BID   feeding supplement  237 mL Oral TID BM   folic acid  1 mg Oral Daily   gabapentin  100 mg Oral Q8H   heparin  5,000 Units Subcutaneous Q8H   levothyroxine  25 mcg Oral Q0600   lipase/protease/amylase  24,000 Units Oral TID WC   loperamide  2 mg Oral Q6H   midodrine  15 mg Oral TID WC   multivitamin with minerals  1 tablet Oral Daily   polycarbophil  1,250 mg Oral BID   sodium chloride flush  10-40 mL Intracatheter Q12H   sodium chloride  1 g Oral BID WC   thiamine  100 mg Oral Daily   Vitamin D  (Ergocalciferol)  50,000 Units Oral Q7 days    have reviewed scheduled and prn medications.  Physical Exam: General:NAD, comfortable Heart:RRR, s1s2 nl Lungs:clear b/l, no crackle Abdomen:soft, Non-tender, non-distended, ileostomy bag and nephrostomy  bag present. Extremities:No edema Neurology: Alert awake.  Steed Kanaan Prasad Bailea Beed 09/12/2023,1:55 PM  LOS: 9 days

## 2023-09-12 NOTE — Care Management Important Message (Signed)
 Important Message  Patient Details  Name: [name] MRN: [id number] Date of Birth: 1956-01-29   Important Message Given:  Yes - Medicare IM     Dorena Bodo 09/12/2023, 2:58 PM

## 2023-09-12 NOTE — Progress Notes (Signed)
 [facility] PROGRESS NOTE   [name] BMW:[id number] DOB: 1955-09-21 DOA: 09/03/2023  PCP: Patient, [name]  Brief History: 68 yr female with a significant past medical history for cervical cancer 2003 s/p hysterectomy, hx of TPN use/malnutrition, right hydronephrosis s/p uretal stent 03/2023 and R nephrostomy tube placed in 04/2023 due to hospitalization at [facility] for obstructive uropathy AKI and BL hydronephrosis complicated by Enterobacter and Candida UTI, rectovaginal s/p anterior resection with colovaginal fistula repair, loop ileostomy with closure in 2021, SBO s/p with lysis of adhesions, recurrent SBO, ileum/cecum resection with ileostomy creation 2024, and recent [facility] hospitalization (2/14-2/23/25) which revealed patient had severe dehydration in the setting of high output and poor oral intake who presents to [facility] ED from [facility] due to abnormal lab work indicating acute renal failure (Cr >10, BUN >169, Anion Gap acidosis, severe hyperkalemia-k>6, and hyponatremia Na 117. Due to severity of acute kidney failure and acidosis, PCCM was consulted for ICU admission and further management along with nephrology consult.   Consultants: Critical care medicine.  Nephrology  Procedures: CRRT while in ICU.  Hemodialysis catheter placement.    Subjective/Interval History: Patient sitting up in the bed eating her breakfast.  Denies any shortness of breath.  Occasional pain in the left flank area.  Slept well overnight.  Tolerating her diet.    Assessment/Plan:  Acute kidney injury secondary to ATN in the setting of shock/acute metabolic acidosis/electrolyte abnormalities with hypokalemia hypomagnesemia and hyponatremia Presented with creatinine of 10 and a potassium of 6.6. Was seen by nephrology.  HD catheter was placed and patient underwent CRRT. Creatinine has been gradually improving.  Noted to be 2.41 today.  This could be her new baseline.  Renal  function was normal back in January but has been greater than 2 since February.  Nephrology continues to follow. Her high output from ostomy predisposes her to hypovolemia leading to renal failure. Sodium level has improved.  Noted to be on salt tablets.   Potassium to be given this morning. Remains on IV fluids.  Had good urine output yesterday.  Discussed with nephrology who will evaluate patient and consider stopping IV fluids and monitoring renal function off of IV fluids.  Septic shock due to urinary tract infection/chronic hypotension Noted to be on Zosyn.  No positive cultures noted.  However she does have a complicated urinary tract with presence of gastrostomy tube. Will need Zosyn for at least 7 to 10 days.  Looks like Zosyn was initiated on 3/22.  Prior to that she was on ceftriaxone. She is noted to be on midodrine.  Has low blood pressures but she is asymptomatic for the most part.    Acute metabolic encephalopathy This is in the setting of electrolyte abnormalities and renal failure.  Gradually improving.  No focal deficits.  Hypoglycemia Noted to have low glucose levels overnight.  She is noted to be on insulin.  She denies any history of diabetes.  Home medication list reviewed.  Not noted to be on any glucose lowering agents at home.  HbA1c 5.7.  Will discontinue long-acting and SSI.  Check her glucose levels for now and if stable can be discontinued tomorrow.  History of small bowel obstruction with recurrent SBO's Has had multiple surgeries previously mostly done at [facility].  Currently has ileostomy. Surgeon at [facility] has said that there was no role for reversing her ileostomy.   Surgery here was consulted since husband wanted another opinion.  They also do not  plan any surgical interventions at this time.  Ileostomy reversal will be high risk. This can be further pursued in the outpatient setting.  High output ostomy This is a main reason why she became hypovolemic and  went into renal failure. Noted to be on colestyramine, Lomotil and Imodium, FiberCon. No significant improvement in output noted.  Normocytic anemia Low hemoglobin noted but no evidence of overt bleeding.  Has been stable for the last several days.  [facility].  This was changed by IR on 3/24.    Severe malnutrition/use of TPN during previous hospitalizations at [facility] Against the use of NG tube and has been refusing TNF as well. Palliative care was consulted.  Normocytic anemia and mild thrombocytopenia No evidence of overt bleeding.  Monitor counts periodically.  History of neuropathic pain Noted to be on gabapentin.  Hypothyroidism Continue levothyroxine.  Goals of care Palliative care is following.  Remains full code.   DVT Prophylaxis: Subcutaneous heparin Code Status: Full code Family Communication: Discussed with patient.  No family at bedside Disposition Plan: PT and OT evaluation    Medications: Scheduled:  Chlorhexidine Gluconate Cloth  6 each Topical Daily   cholestyramine light  4 g Oral TID   clotrimazole  1 Applicatorful Vaginal QHS   vitamin B-12  1,000 mcg Oral Daily   diphenoxylate-atropine  2 tablet Oral BID   feeding supplement  237 mL Oral TID BM   folic acid  1 mg Oral Daily   gabapentin  100 mg Oral Q8H   heparin  5,000 Units Subcutaneous Q8H   levothyroxine  25 mcg Oral Q0600   lipase/protease/amylase  24,000 Units Oral TID WC   loperamide  2 mg Oral Q6H   midodrine  15 mg Oral TID WC   multivitamin with minerals  1 tablet Oral Daily   polycarbophil  1,250 mg Oral BID   sodium chloride flush  10-40 mL Intracatheter Q12H   sodium chloride  1 g Oral BID WC   thiamine  100 mg Oral Daily   Vitamin D (Ergocalciferol)  50,000 Units Oral Q7 days   Continuous:  lactated ringers 75 mL/hr at 09/12/23 0648   piperacillin-tazobactam (ZOSYN)  IV Stopped (09/12/23 0559)   ZOX:[id number],  ondansetron (ZOFRAN) IV, sodium chloride flush  Antibiotics: Anti-infectives (From admission, onward)    Start     Dose/Rate Route Frequency Ordered Stop   09/06/23 1400  piperacillin-tazobactam (ZOSYN) IVPB 2.25 g        2.25 g 100 mL/hr over 30 Minutes Intravenous Every 8 hours 09/06/23 0940     09/05/23 0100  vancomycin (VANCOREADY) IVPB 500 mg/100 mL  Status:  Discontinued        500 mg 100 mL/hr over 60 Minutes Intravenous Every 24 hours 09/04/23 0128 09/06/23 0920   09/04/23 0000  piperacillin-tazobactam (ZOSYN) IVPB 3.375 g  Status:  Discontinued        3.375 g 100 mL/hr over 30 Minutes Intravenous Every 6 hours 09/03/23 2307 09/06/23 0940   09/03/23 2330  vancomycin (VANCOCIN) IVPB 1000 mg/200 mL premix        1,000 mg 200 mL/hr over 60 Minutes Intravenous  Once 09/03/23 2307 09/04/23 0215   09/03/23 2200  cefTRIAXone (ROCEPHIN) 1 g in sodium chloride 0.9 % 100 mL IVPB  Status:  Discontinued        1 g 200 mL/hr over 30 Minutes Intravenous Every 24 hours 09/03/23 2121 09/03/23 2249   09/03/23  2200  fluconazole (DIFLUCAN) IVPB 400 mg  Status:  Discontinued        400 mg 100 mL/hr over 120 Minutes Intravenous Every 24 hours 09/03/23 2121 09/04/23 1627       Objective:  Vital Signs  Vitals:   09/11/23 1916 09/11/23 2320 09/12/23 0400 09/12/23 0722  BP: (!) 100/54 95/61 101/72 92/62  Pulse: 76 71 62 60  Resp: 19  17 12   Temp: (!) 97.5 F (36.4 C) 97.6 F (36.4 C) (!) 97.5 F (36.4 C) 98.1 F (36.7 C)  TempSrc: Oral Axillary Axillary Oral  SpO2: 100% 99% 100% 100%  Weight:   43.4 kg   Height:        Intake/Output Summary (Last 24 hours) at 09/12/2023 0927 Last data filed at 09/12/2023 0648 Gross per 24 hour  Intake 2361.27 ml  Output 3760 ml  Net -1398.73 ml   Filed Weights   09/08/23 0400 09/11/23 0310 09/12/23 0400  Weight: 38.6 kg 42.9 kg 43.4 kg    General appearance: Awake alert.  In no distress Resp: Clear to auscultation bilaterally.  Normal  effort Cardio: S1-S2 is normal regular.  No S3-S4.  No rubs murmurs or bruit GI: Abdomen is soft.  Nontender nondistended.  Bowel sounds are present normal.  No masses organomegaly   Lab Results:  Data Reviewed: I have personally reviewed following labs and reports of the imaging studies  CBC: Recent Labs  Lab 09/05/23 1315 09/05/23 1700 09/06/23 0356 09/09/23 0436 09/10/23 0404 09/12/23 0524  WBC 9.8  --  11.6* 13.9* 7.1 3.9*  HGB 7.2* 7.4* 8.0* 6.7* 7.9* 7.4*  HCT 19.9* 20.2* 21.9* 19.0* 22.2* 21.9*  MCV 93.9  --  93.6 96.0 93.3 96.9  PLT 106*  --  113* 159 133* 146*    Basic Metabolic Panel: Recent Labs  Lab 09/08/23 0355 09/09/23 0436 09/10/23 0404 09/10/23 0956 09/11/23 0425 09/11/23 1610 09/12/23 0524  NA 126* 130* 134*  --  135  --  133*  K 4.1 3.1* 2.6* 3.4* 3.1* 3.0* 3.0*  CL 84* 92* 93*  --  92*  --  92*  CO2 29 27 29   --  34*  --  31  GLUCOSE 167* 92 71  --  74  --  139*  BUN 41* 38* 34*  --  31*  --  29*  CREATININE 3.33* 3.02* 2.71*  --  2.42*  --  2.41*  CALCIUM 8.5* 8.2* 8.6*  --  8.5*  --  8.5*  MG 3.5* 2.1 2.0  --  1.4*  --  2.1  PHOS 3.6 2.8 2.9  --  2.8  --  2.9    GFR: Estimated Creatinine Clearance: 15.5 mL/min (A) (by C-G formula based on SCr of 2.41 mg/dL (H)).  Liver Function Tests: Recent Labs  Lab 09/08/23 0355 09/09/23 0436 09/10/23 0404 09/11/23 0425 09/12/23 0524  ALBUMIN 2.3* 2.1* 2.0* 1.9* 1.9*    CBG: Recent Labs  Lab 09/11/23 1918 09/12/23 0001 09/12/23 0402 09/12/23 0425 09/12/23 0721  GLUCAP 134* 102* 52* 79 76     Recent Results (from the past 240 hours)  Culture, blood (Routine X 2) w Reflex to ID Panel     Status: None   Collection Time: 09/03/23 10:45 PM   Specimen: BLOOD RIGHT ARM  Result Value Ref Range Status   Specimen Description BLOOD RIGHT ARM  Final   Special Requests   Final    BOTTLES DRAWN AEROBIC AND ANAEROBIC Blood Culture results  may not be optimal due to an inadequate volume of blood  received in culture bottles   Culture   Final    NO GROWTH 5 DAYS Performed at [facility], [address] [place], [place] [address]    Report Status 09/08/2023 FINAL  Final  MRSA Next Gen by PCR, Nasal     Status: None   Collection Time: 09/03/23 10:51 PM   Specimen: Nasal Mucosa; Nasal Swab  Result Value Ref Range Status   MRSA by PCR Next Gen NOT DETECTED NOT DETECTED Final    Comment: (NOTE) The GeneXpert MRSA Assay (FDA approved for NASAL specimens only), is one component of a comprehensive MRSA colonization surveillance program. It is not intended to diagnose MRSA infection nor to guide or monitor treatment for MRSA infections. Test performance is not FDA approved in patients less than 16 years old. Performed at [facility], [address], [place], [place] [address]   Culture, blood (Routine X 2) w Reflex to ID Panel     Status: None   Collection Time: 09/04/23  6:18 AM   Specimen: BLOOD RIGHT HAND  Result Value Ref Range Status   Specimen Description BLOOD RIGHT HAND  Final   Special Requests   Final    BOTTLES DRAWN AEROBIC AND ANAEROBIC Blood Culture adequate volume   Culture   Final    NO GROWTH 5 DAYS Performed at [facility], [address], [place], [place] [address]    Report Status 09/09/2023 FINAL  Final  Culture, blood (Routine X 2) w Reflex to ID Panel     Status: None   Collection Time: 09/04/23  6:21 AM   Specimen: BLOOD RIGHT ARM  Result Value Ref Range Status   Specimen Description BLOOD RIGHT ARM  Final   Special Requests   Final    BOTTLES DRAWN AEROBIC AND ANAEROBIC Blood Culture adequate volume   Culture   Final    NO GROWTH 5 DAYS Performed at [facility], [address] [place], [place] [address]    Report Status 09/09/2023 FINAL  Final  Urine Culture (for pregnant, neutropenic or urologic patients or patients with an indwelling urinary catheter)     Status: Abnormal   Collection Time: 09/05/23  1:15 PM    Specimen: Urine, Catheterized  Result Value Ref Range Status   Specimen Description URINE, CATHETERIZED  Final   Special Requests   Final    NONE Performed at [facility], [address] [place], [place] [address]    Culture 50,000 COLONIES/mL YEAST (A)  Final   Report Status 09/06/2023 FINAL  Final  Gastrointestinal Panel by PCR , Stool     Status: None   Collection Time: 09/06/23  9:26 AM   Specimen: Ileostomy; Stool  Result Value Ref Range Status   Campylobacter species NOT DETECTED NOT DETECTED Final   Plesimonas shigelloides NOT DETECTED NOT DETECTED Final   Salmonella species NOT DETECTED NOT DETECTED Final   Yersinia enterocolitica NOT DETECTED NOT DETECTED Final   Vibrio species NOT DETECTED NOT DETECTED Final   Vibrio cholerae NOT DETECTED NOT DETECTED Final   Enteroaggregative E coli (EAEC) NOT DETECTED NOT DETECTED Final   Enteropathogenic E coli (EPEC) NOT DETECTED NOT DETECTED Final   Enterotoxigenic E coli (ETEC) NOT DETECTED NOT DETECTED Final   Shiga like toxin producing E coli (STEC) NOT DETECTED NOT DETECTED Final   Shigella/Enteroinvasive E coli (EIEC) NOT DETECTED NOT DETECTED  Final   Cryptosporidium NOT DETECTED NOT DETECTED Final   Cyclospora cayetanensis NOT DETECTED NOT DETECTED Final   Entamoeba histolytica NOT DETECTED NOT DETECTED Final   Giardia lamblia NOT DETECTED NOT DETECTED Final   Adenovirus F40/41 NOT DETECTED NOT DETECTED Final   Astrovirus NOT DETECTED NOT DETECTED Final   Norovirus GI/GII NOT DETECTED NOT DETECTED Final   Rotavirus A NOT DETECTED NOT DETECTED Final   Sapovirus (I, II, IV, and V) NOT DETECTED NOT DETECTED Final    Comment: Performed at [facility], [address], [place], [place] [address]      Radiology Studies: No results found.     LOS: 9 days   Yichen Gilardi [employer] on [email or web address]  09/12/2023, 9:27 AM

## 2023-09-12 NOTE — Plan of Care (Signed)

## 2023-09-12 NOTE — Progress Notes (Addendum)
 Pharmacy Antibiotic Note  [name] is a 68 y.o. female admitted on 09/03/2023 with  acute renal failure Pharmacy has been consulted for Zosyn dosing for rule out sepsis. Suspected pyelonephritis > nephrostomy tube changed 3/24   Zosyn 3/20 >> Vanc 3/20>> 3/22  3/19 BCx > ng 3/20 BCx x 2 > ng 3/21 UCx > reincubated MRSA PCR 3/19: neg    -SCr= 2.41, CrCl ~ 15 -cultures- neg -plans are to continue zosyn for 7-10 days after nephrostomy tube placement  Plan: -Continue Zosyn to 2.25g IV q 8 hrs  -Okay to stop on 4/1 per Dr. Rito Ehrlich  Will sign off. Please contact pharmacy with any other needs.  Thank you  Harland German, PharmD Clinical Pharmacist **Pharmacist phone directory can now be found on [email or web address] ([email or web address]).  Listed under [facility].     Height: 5\' 2"  (157.5 cm) Weight: 43.4 kg (95 lb 10.9 oz) IBW/kg (Calculated) : 50.1  Temp (24hrs), Avg:97.8 F (36.6 C), Min:97.4 F (36.3 C), Max:98.5 F (36.9 C)  Recent Labs  Lab 09/05/23 1315 09/05/23 1700 09/06/23 0356 09/06/23 1634 09/08/23 0355 09/09/23 0436 09/10/23 0404 09/11/23 0425 09/12/23 0524  WBC 9.8  --  11.6*  --   --  13.9* 7.1  --  3.9*  CREATININE 2.55*   < > 2.91*  2.88*   < > 3.33* 3.02* 2.71* 2.42* 2.41*   < > = values in this interval not displayed.    Estimated Creatinine Clearance: 15.5 mL/min (A) (by C-G formula based on SCr of 2.41 mg/dL (H)).    Allergies  Allergen Reactions   Barium-Containing Compounds     hives   Percocet [Oxycodone-Acetaminophen] Itching and Other (See Comments)    migraine    Fluconazole 3/19 >> 3/20 Zosyn 3/20 >> Vanc 3/20>>   3/19 BCx > ng 3/20 BCx x 2 > ng 3/21 UCx > reincubated MRSA PCR 3/19: neg

## 2023-09-12 NOTE — TOC Progression Note (Addendum)
 Transition of Care [facility]) - Progression Note    Patient Details  Name: [name] MRN: [id number] Date of Birth: 1956/04/04  Transition of [facility]) CM/SW Contact  Erin Sons, [place] Phone Number: 09/12/2023, 3:08 PM  Clinical Narrative:     CSW discussed pt with [facility] rehab. They have concerns about pts nutrition plans as she is refusing feeding tube and TPN. Also concerned about pt being STR vs  LTC vs comfort. She does not have medicaid for LTC. [facility] would need GOC clarified prior to offering. Pt has no other bed offers at this time.   Expected Discharge Plan: Skilled Nursing Facility Barriers to Discharge: Continued Medical Work up  Expected Discharge Plan and Services       Living arrangements for the past 2 months: Single Family Home                                       Social Determinants of Health (SDOH) Interventions SDOH Screenings   Food Insecurity: No Food Insecurity (09/04/2023)  Housing: Low Risk  (09/04/2023)  Transportation Needs: No Transportation Needs (09/04/2023)  Utilities: Not At Risk (09/04/2023)  Depression (PHQ2-9): Low Risk  (12/06/2022)  Financial Resource Strain: Low Risk  (08/11/2023)   Received from [facility]  Social Connections: Moderately Isolated (09/04/2023)  Stress: No Stress Concern Present (11/06/2022)  Tobacco Use: Medium Risk (09/04/2023)    Readmission Risk Interventions    06/24/2023    2:33 PM 11/21/2022   10:10 AM 11/18/2022    2:11 PM  Readmission Risk Prevention Plan  Post Dischage Appt   Complete  Medication Screening   Complete  Transportation Screening Complete Complete Complete  PCP or Specialist Appt within 3-5 Days Complete    HRI or Home Care Consult Complete Complete   Social Work Consult for Recovery Care Planning/Counseling Complete Complete   Palliative Care Screening Not Applicable Not Applicable   Medication Review Oceanographer) Complete Complete

## 2023-09-12 NOTE — TOC Progression Note (Signed)
 Transition of Care [facility]) - Progression Note    Patient Details  Name: [name] MRN: [id number] Date of Birth: October 12, 1955  Transition [facility]) CM/SW Contact  Erin Sons, [place] Phone Number: 09/12/2023, 10:41 AM  Clinical Narrative:     Pt has no bed offers. [facility] is listed as "considering." CSW contacted [facility] and inquired about potential bed offer; awaiting response.   Expected Discharge Plan: Skilled Nursing Facility Barriers to Discharge: Continued Medical Work up  Expected Discharge Plan and Services       Living arrangements for the past 2 months: Single Family Home                                       Social Determinants of Health (SDOH) Interventions SDOH Screenings   Food Insecurity: No Food Insecurity (09/04/2023)  Housing: Low Risk  (09/04/2023)  Transportation Needs: No Transportation Needs (09/04/2023)  Utilities: Not At Risk (09/04/2023)  Depression (PHQ2-9): Low Risk  (12/06/2022)  Financial Resource Strain: Low Risk  (08/11/2023)   [facility]  Social Connections: Moderately Isolated (09/04/2023)  Stress: No Stress Concern Present (11/06/2022)  Tobacco Use: Medium Risk (09/04/2023)    Readmission Risk Interventions    06/24/2023    2:33 PM 11/21/2022   10:10 AM 11/18/2022    2:11 PM  Readmission Risk Prevention Plan  Post Dischage Appt   Complete  Medication Screening   Complete  Transportation Screening Complete Complete Complete  PCP or Specialist Appt within 3-5 Days Complete    HRI or Home Care Consult Complete Complete   Social Work Consult for Recovery Care Planning/Counseling Complete Complete   Palliative Care Screening Not Applicable Not Applicable   Medication Review Oceanographer) Complete Complete

## 2023-09-13 DIAGNOSIS — E876 Hypokalemia: Secondary | ICD-10-CM | POA: Diagnosis not present

## 2023-09-13 DIAGNOSIS — N179 Acute kidney failure, unspecified: Secondary | ICD-10-CM | POA: Diagnosis not present

## 2023-09-13 DIAGNOSIS — D649 Anemia, unspecified: Secondary | ICD-10-CM | POA: Diagnosis not present

## 2023-09-13 DIAGNOSIS — E871 Hypo-osmolality and hyponatremia: Secondary | ICD-10-CM | POA: Diagnosis not present

## 2023-09-13 LAB — RENAL FUNCTION PANEL
Albumin: 2.2 g/dL — ABNORMAL LOW (ref 3.5–5.0)
Anion gap: 12 (ref 5–15)
BUN: 30 mg/dL — ABNORMAL HIGH (ref 8–23)
CO2: 34 mmol/L — ABNORMAL HIGH (ref 22–32)
Calcium: 9 mg/dL (ref 8.9–10.3)
Chloride: 89 mmol/L — ABNORMAL LOW (ref 98–111)
Creatinine, Ser: 2.39 mg/dL — ABNORMAL HIGH (ref 0.44–1.00)
GFR, Estimated: 22 mL/min — ABNORMAL LOW (ref >60)
Glucose, Bld: 80 mg/dL (ref 70–99)
Phosphorus: 2.9 mg/dL (ref 2.5–4.6)
Potassium: 2.7 mmol/L — CL (ref 3.5–5.1)
Sodium: 135 mmol/L (ref 135–145)

## 2023-09-13 LAB — POTASSIUM: Potassium: 3.8 mmol/L (ref 3.5–5.1)

## 2023-09-13 LAB — GLUCOSE, CAPILLARY
Glucose-Capillary: 107 mg/dL — ABNORMAL HIGH (ref 70–99)
Glucose-Capillary: 171 mg/dL — ABNORMAL HIGH (ref 70–99)
Glucose-Capillary: 234 mg/dL — ABNORMAL HIGH (ref 70–99)
Glucose-Capillary: 173 mg/dL — ABNORMAL HIGH (ref 70–99)

## 2023-09-13 LAB — MAGNESIUM: Magnesium: 1.7 mg/dL (ref 1.7–2.4)

## 2023-09-13 MED ORDER — LOPERAMIDE HCL 2 MG PO CAPS
4.0000 mg | ORAL_CAPSULE | Freq: Four times a day (QID) | ORAL | Status: DC
  Administered 2023-09-13 – 2023-09-14 (×3): 4 mg via ORAL
  Filled 2023-09-13 (×3): qty 2

## 2023-09-13 MED ORDER — POTASSIUM CHLORIDE 10 MEQ/100ML IV SOLN
10.0000 meq | INTRAVENOUS | Status: AC
  Administered 2023-09-13 (×2): 10 meq via INTRAVENOUS
  Filled 2023-09-13 (×2): qty 100

## 2023-09-13 MED ORDER — FLUCONAZOLE 150 MG PO TABS
150.0000 mg | ORAL_TABLET | Freq: Once | ORAL | Status: AC
  Administered 2023-09-13: 150 mg via ORAL
  Filled 2023-09-13: qty 1

## 2023-09-13 MED ORDER — OCTREOTIDE ACETATE 100 MCG/ML IJ SOLN
100.0000 ug | Freq: Two times a day (BID) | INTRAMUSCULAR | Status: DC
  Administered 2023-09-13 – 2023-09-14 (×3): 100 ug via SUBCUTANEOUS
  Filled 2023-09-13 (×3): qty 1

## 2023-09-13 MED ORDER — POTASSIUM CHLORIDE CRYS ER 20 MEQ PO TBCR
40.0000 meq | EXTENDED_RELEASE_TABLET | ORAL | Status: AC
  Administered 2023-09-13: 40 meq via ORAL
  Filled 2023-09-13: qty 2

## 2023-09-13 MED ORDER — POTASSIUM CHLORIDE CRYS ER 20 MEQ PO TBCR
40.0000 meq | EXTENDED_RELEASE_TABLET | Freq: Once | ORAL | Status: AC
  Administered 2023-09-13: 40 meq via ORAL
  Filled 2023-09-13: qty 2

## 2023-09-13 MED ORDER — SODIUM CHLORIDE 0.9% FLUSH
3.0000 mL | Freq: Two times a day (BID) | INTRAVENOUS | Status: DC
  Administered 2023-09-17 – 2023-09-19 (×2): 3 mL via INTRAVENOUS

## 2023-09-13 MED ORDER — POTASSIUM CHLORIDE 20 MEQ PO PACK
40.0000 meq | PACK | ORAL | Status: DC
Start: 2023-09-13 — End: 2023-09-13

## 2023-09-13 MED ORDER — MAGNESIUM SULFATE 2 GM/50ML IV SOLN
2.0000 g | Freq: Once | INTRAVENOUS | Status: AC
  Administered 2023-09-13: 2 g via INTRAVENOUS
  Filled 2023-09-13: qty 50

## 2023-09-13 NOTE — Plan of Care (Signed)
   Problem: Fluid Volume: Goal: Ability to maintain a balanced intake and output will improve Outcome: Progressing   Problem: Metabolic: Goal: Ability to maintain appropriate glucose levels will improve Outcome: Progressing   Problem: Nutritional: Goal: Maintenance of adequate nutrition will improve Outcome: Progressing

## 2023-09-13 NOTE — Progress Notes (Signed)
 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 Due to severity of acute kidney failure and acidosis, PCCM was consulted for ICU admission and further management along with nephrology consult.   Consultants: Critical care medicine.  Nephrology  Procedures: CRRT while in ICU.  Hemodialysis catheter placement.    Subjective/Interval History: Patient is sitting up on the bed eating her breakfast.  Denies any complaints.  Continues to have significant output from her ileostomy.     Assessment/Plan:  Acute kidney injury secondary to ATN in the setting of shock/acute metabolic acidosis/electrolyte abnormalities with hypokalemia hypomagnesemia and hyponatremia Presented with creatinine of 10 and a potassium of 6.6. Was seen by nephrology.  HD catheter was placed and patient underwent CRRT. Creatinine has been gradually improving.  Has been stable around 2.4.  This could be her new baseline.  Renal function was normal back in  January but has been greater than 2 since February.  Nephrology has signed off.  They have placed orders for the dialysis catheter to be removed. Her high output from ostomy predisposes her to hypovolemia leading to renal failure. Sodium level has improved.  Noted to be on salt tablets.   Potassium level remains low.  Magnesium is 1.7.  Potassium supplements have been ordered.  Recheck potassium later today.  IV fluids have been discontinued.  Patient encouraged to hydrate orally.  Septic shock due to urinary tract infection/chronic hypotension Noted to be on Zosyn.  No positive cultures noted.  However she does have a complicated urinary tract with presence of gastrostomy tube. Will need Zosyn for at least 7 to 10 days.  Looks like Zosyn was initiated on 3/22.  Prior to that she was on ceftriaxone. She is noted to be on midodrine.  Has low blood pressures but she is asymptomatic for the most part.    Acute metabolic encephalopathy This is in the setting of electrolyte abnormalities and renal failure.  Gradually improving.  No focal deficits.  Hypoglycemia She was noted to have low glucose levels.  Was noted to be on insulin though she denied history of diabetes.  HbA1c was 5.7.  Insulin products were discontinued.  Hypoglycemia appears to have resolved.    History of small bowel obstruction with recurrent SBO's Has had multiple surgeries previously mostly done at [facility].  Currently has ileostomy. Surgeon at [facility] has said that there was no role for reversing her ileostomy.   Surgery here was consulted since husband wanted another opinion.  They also do not plan any surgical interventions at this time.  Ileostomy reversal will be high risk. This can be further pursued in the outpatient setting.  High  output ostomy This is a main reason why she became hypovolemic and went into renal failure. Noted to be on colestyramine, Lomotil and Imodium, FiberCon. No significant improvement in output.   Will increase the Imodium dose.  Will give her a trial of octreotide.  [facility].  This was changed by IR on 3/24.    Severe malnutrition/use of TPN during previous hospitalizations at [facility] Against the use of NG tube and has been refusing TNF as well. Oral intake has improved in the last few days.  Continue to encourage.  Normocytic anemia and mild thrombocytopenia Low hemoglobin noted but no evidence of overt bleeding.  Has been stable for the last several days.  Check periodically. Platelet counts are stable.  History of neuropathic pain Noted to be on gabapentin.  Hypothyroidism Continue levothyroxine.  Goals of care Palliative care is following.  Remains full code.   DVT Prophylaxis: Subcutaneous heparin Code Status: Full code Family Communication: Discussed with patient.  No family at bedside Disposition Plan: SNF when medically ready    Medications: Scheduled:  Chlorhexidine Gluconate Cloth  6 each Topical Daily   cholestyramine light  4 g Oral TID   vitamin B-12  1,000 mcg Oral Daily   diphenoxylate-atropine  2 tablet Oral BID   feeding supplement  237 mL Oral TID BM   folic acid  1 mg Oral Daily   gabapentin  100 mg Oral Q8H   heparin  5,000 Units Subcutaneous Q8H   levothyroxine  25 mcg Oral Q0600   lipase/protease/amylase  24,000 Units Oral TID WC   loperamide  2 mg Oral Q6H   midodrine  15 mg Oral TID WC   multivitamin with minerals  1 tablet Oral Daily   polycarbophil  1,250 mg Oral BID   sodium chloride flush  10-40 mL Intracatheter Q12H   sodium chloride  1 g Oral BID WC   thiamine  100 mg Oral Daily   Vitamin D (Ergocalciferol)  50,000 Units Oral Q7 days   Continuous:  piperacillin-tazobactam (ZOSYN)  IV 2.25 g (09/13/23 0506)   ONG:[id number], ondansetron (ZOFRAN) IV, sodium chloride flush  Antibiotics: Anti-infectives (From admission, onward)    Start     Dose/Rate Route  Frequency Ordered Stop   09/06/23 1400  piperacillin-tazobactam (ZOSYN) IVPB 2.25 g        2.25 g 100 mL/hr over 30 Minutes Intravenous Every 8 hours 09/06/23 0940 09/16/23 2359   09/05/23 0100  vancomycin (VANCOREADY) IVPB 500 mg/100 mL  Status:  Discontinued        500 mg 100 mL/hr over 60 Minutes Intravenous Every 24 hours 09/04/23 0128 09/06/23 0920   09/04/23 0000  piperacillin-tazobactam (ZOSYN) IVPB 3.375 g  Status:  Discontinued        3.375 g 100 mL/hr over 30 Minutes Intravenous Every 6 hours 09/03/23 2307 09/06/23 0940   09/03/23 2330  vancomycin (VANCOCIN) IVPB 1000 mg/200 mL premix        1,000 mg 200 mL/hr over 60 Minutes Intravenous  Once 09/03/23 2307 09/04/23 0215   09/03/23 2200  cefTRIAXone (ROCEPHIN) 1 g in sodium chloride 0.9 % 100 mL IVPB  Status:  Discontinued        1 g 200 mL/hr over 30 Minutes Intravenous Every 24 hours 09/03/23 2121 09/03/23 2249   09/03/23 2200  fluconazole (DIFLUCAN) IVPB 400 mg  Status:  Discontinued        400 mg 100 mL/hr over 120  Minutes Intravenous Every 24 hours 09/03/23 2121 09/04/23 1627       Objective:  Vital Signs  Vitals:   09/12/23 1143 09/12/23 1626 09/12/23 1947 09/13/23 0453  BP: 98/65 106/77 112/75 (!) 100/58  Pulse: 80 97 78 76  Resp: 16 16 19 19   Temp: 98.6 F (37 C) 99.7 F (37.6 C) 98.7 F (37.1 C) 97.9 F (36.6 C)  TempSrc: Oral Oral Oral Oral  SpO2: 98% 96% 98% 100%  Weight:    44.3 kg  Height:        Intake/Output Summary (Last 24 hours) at 09/13/2023 1023 Last data filed at 09/13/2023 0944 Gross per 24 hour  Intake 610 ml  Output 5300 ml  Net -4690 ml   Filed Weights   09/11/23 0310 09/12/23 0400 09/13/23 0453  Weight: 42.9 kg 43.4 kg 44.3 kg    General appearance: Awake alert.  In no distress Resp: Clear to auscultation bilaterally.  Normal effort Cardio: S1-S2 is normal regular.  No S3-S4.  No rubs murmurs or bruit GI: Abdomen is soft.  Ileostomy and nephrostomy tube noted. Extremities:  No edema.  No obvious focal neurological deficits.   Lab Results:  Data Reviewed: I have personally reviewed following labs and reports of the imaging studies  CBC: Recent Labs  Lab 09/09/23 0436 09/10/23 0404 09/12/23 0524  WBC 13.9* 7.1 3.9*  HGB 6.7* 7.9* 7.4*  HCT 19.0* 22.2* 21.9*  MCV 96.0 93.3 96.9  PLT 159 133* 146*    Basic Metabolic Panel: Recent Labs  Lab 09/09/23 0436 09/10/23 0404 09/10/23 0956 09/11/23 0425 09/11/23 1610 09/12/23 0524 09/13/23 0239 09/13/23 0600  NA 130* 134*  --  135  --  133* 135  --   K 3.1* 2.6* 3.4* 3.1* 3.0* 3.0* 2.7*  --   CL 92* 93*  --  92*  --  92* 89*  --   CO2 27 29  --  34*  --  31 34*  --   GLUCOSE 92 71  --  74  --  139* 80  --   BUN 38* 34*  --  31*  --  29* 30*  --   CREATININE 3.02* 2.71*  --  2.42*  --  2.41* 2.39*  --   CALCIUM 8.2* 8.6*  --  8.5*  --  8.5* 9.0  --   MG 2.1 2.0  --  1.4*  --  2.1  --  1.7  PHOS 2.8 2.9  --  2.8  --  2.9 2.9  --     GFR: Estimated Creatinine Clearance: 16 mL/min (A) (by C-G formula based on SCr of 2.39 mg/dL (H)).  Liver Function Tests: Recent Labs  Lab 09/09/23 0436 09/10/23 0404 09/11/23 0425 09/12/23 0524 09/13/23 0239  ALBUMIN 2.1* 2.0* 1.9* 1.9* 2.2*    CBG: Recent Labs  Lab 09/12/23 0721 09/12/23 1142 09/12/23 1633 09/12/23 2143 09/13/23 0755  GLUCAP 76 235* 187* 128* 107*     Recent Results (from the past 240 hours)  Culture, blood (Routine X 2) w Reflex to ID Panel     Status: None   Collection Time: 09/03/23 10:45 PM   Specimen: BLOOD RIGHT ARM  Result Value Ref Range Status   Specimen Description BLOOD RIGHT ARM  Final   Special Requests   Final    BOTTLES DRAWN AEROBIC AND ANAEROBIC Blood Culture results may not be optimal due to an inadequate volume of blood received in culture bottles   Culture  Final    NO GROWTH 5 DAYS Performed at [facility], [address], [place], [place] [address]    Report Status 09/08/2023 FINAL  Final   MRSA Next Gen by PCR, Nasal     Status: None   Collection Time: 09/03/23 10:51 PM   Specimen: Nasal Mucosa; Nasal Swab  Result Value Ref Range Status   MRSA by PCR Next Gen NOT DETECTED NOT DETECTED Final    Comment: (NOTE) The GeneXpert MRSA Assay (FDA approved for NASAL specimens only), is one component of a comprehensive MRSA colonization surveillance program. It is not intended to diagnose MRSA infection nor to guide or monitor treatment for MRSA infections. Test performance is not FDA approved in patients less than 27 years old. Performed at [facility], [address], [place], [place] [address]   Culture, blood (Routine X 2) w Reflex to ID Panel     Status: None   Collection Time: 09/04/23  6:18 AM   Specimen: BLOOD RIGHT HAND  Result Value Ref Range Status   Specimen Description BLOOD RIGHT HAND  Final   Special Requests   Final    BOTTLES DRAWN AEROBIC AND ANAEROBIC Blood Culture adequate volume   Culture   Final    NO GROWTH 5 DAYS Performed at [facility], [address] [place], [place] [address]    Report Status 09/09/2023 FINAL  Final  Culture, blood (Routine X 2) w Reflex to ID Panel     Status: None   Collection Time: 09/04/23  6:21 AM   Specimen: BLOOD RIGHT ARM  Result Value Ref Range Status   Specimen Description BLOOD RIGHT ARM  Final   Special Requests   Final    BOTTLES DRAWN AEROBIC AND ANAEROBIC Blood Culture adequate volume   Culture   Final    NO GROWTH 5 DAYS Performed at [facility], [address] [place], [place] [address]    Report Status 09/09/2023 FINAL  Final  Urine Culture (for pregnant, neutropenic or urologic patients or patients with an indwelling urinary catheter)     Status: Abnormal   Collection Time: 09/05/23  1:15 PM   Specimen: Urine, Catheterized  Result Value Ref Range Status   Specimen Description URINE, CATHETERIZED  Final   Special Requests   Final    NONE Performed at [facility], [address], [place], [place] [address]    Culture 50,000 COLONIES/mL YEAST (A)  Final   Report Status 09/06/2023 FINAL  Final  Gastrointestinal Panel by PCR , Stool     Status: None   Collection Time: 09/06/23  9:26 AM   Specimen: Ileostomy; Stool  Result Value Ref Range Status   Campylobacter species NOT DETECTED NOT DETECTED Final   Plesimonas shigelloides NOT DETECTED NOT DETECTED Final   Salmonella species NOT DETECTED NOT DETECTED Final   Yersinia enterocolitica NOT DETECTED NOT DETECTED Final   Vibrio species NOT DETECTED NOT DETECTED Final   Vibrio cholerae NOT DETECTED NOT DETECTED Final   Enteroaggregative E coli (EAEC) NOT DETECTED NOT DETECTED Final   Enteropathogenic E coli (EPEC) NOT DETECTED NOT DETECTED Final   Enterotoxigenic E coli (ETEC) NOT DETECTED NOT DETECTED Final   Shiga like toxin producing E coli (STEC) NOT DETECTED NOT DETECTED Final   Shigella/Enteroinvasive E coli (EIEC) NOT DETECTED NOT DETECTED Final   Cryptosporidium NOT DETECTED NOT DETECTED Final   Cyclospora cayetanensis NOT DETECTED NOT DETECTED Final  Entamoeba histolytica NOT DETECTED NOT DETECTED Final   Giardia lamblia NOT DETECTED NOT DETECTED Final   Adenovirus F40/41 NOT DETECTED NOT DETECTED Final   Astrovirus NOT DETECTED NOT DETECTED Final   Norovirus GI/GII NOT DETECTED NOT DETECTED Final   Rotavirus A NOT DETECTED NOT DETECTED Final   Sapovirus (I, II, IV, and V) NOT DETECTED NOT DETECTED Final    Comment: Performed at [facility], [address] [place], [place] [address]      Radiology Studies: No results found.     LOS: 10 days   Alysa Duca [employer] on [email or web address]  09/13/2023, 10:23 AM

## 2023-09-13 NOTE — Progress Notes (Signed)
 CVC was removed per order with no complications.  Pt supine and suspended inspiration during removal with instructions to remain supine for 30 minutes after removal.  Pressure held to achieve hemostasis.  Vaseline/gauze/tegaderm applied.  Patient education provided regarding lifting restrictions, site care, and signs of infection.  Pt verbalized understanding and had no questions.

## 2023-09-13 NOTE — Progress Notes (Signed)
 Received a page from the patient's nurse labs showing low potassium 2.7 - Replating with oral KCl 40 mEq and IV KCl 20 mEq.  Tereasa Coop, MD [facility] 09/13/2023, 5:25 AM

## 2023-09-13 NOTE — Progress Notes (Addendum)
 Date and time results received: 09/13/23 05:20  Test: K+  Critical Value: 2.7  Name of Provider Notified: Tereasa Coop, MD  Orders Received: 40 mEq PO KCI & 10 mEq IV KCI x2

## 2023-09-14 DIAGNOSIS — N179 Acute kidney failure, unspecified: Secondary | ICD-10-CM | POA: Diagnosis not present

## 2023-09-14 DIAGNOSIS — E876 Hypokalemia: Secondary | ICD-10-CM | POA: Diagnosis not present

## 2023-09-14 DIAGNOSIS — E871 Hypo-osmolality and hyponatremia: Secondary | ICD-10-CM | POA: Diagnosis not present

## 2023-09-14 DIAGNOSIS — D649 Anemia, unspecified: Secondary | ICD-10-CM | POA: Diagnosis not present

## 2023-09-14 LAB — GLUCOSE, CAPILLARY
Glucose-Capillary: 102 mg/dL — ABNORMAL HIGH (ref 70–99)
Glucose-Capillary: 104 mg/dL — ABNORMAL HIGH (ref 70–99)
Glucose-Capillary: 110 mg/dL — ABNORMAL HIGH (ref 70–99)

## 2023-09-14 LAB — CBC
HCT: 25.6 % — ABNORMAL LOW (ref 36.0–46.0)
Hemoglobin: 8.8 g/dL — ABNORMAL LOW (ref 12.0–15.0)
MCH: 32.8 pg (ref 26.0–34.0)
MCHC: 34.4 g/dL (ref 30.0–36.0)
MCV: 95.5 fL (ref 80.0–100.0)
Platelets: 210 10*3/uL (ref 150–400)
RBC: 2.68 MIL/uL — ABNORMAL LOW (ref 3.87–5.11)
RDW: 17.2 % — ABNORMAL HIGH (ref 11.5–15.5)
WBC: 5 10*3/uL (ref 4.0–10.5)
nRBC: 0 % (ref 0.0–0.2)

## 2023-09-14 LAB — BASIC METABOLIC PANEL WITH GFR
Anion gap: 14 (ref 5–15)
BUN: 38 mg/dL — ABNORMAL HIGH (ref 8–23)
CO2: 31 mmol/L (ref 22–32)
Calcium: 9.4 mg/dL (ref 8.9–10.3)
Chloride: 88 mmol/L — ABNORMAL LOW (ref 98–111)
Creatinine, Ser: 2.92 mg/dL — ABNORMAL HIGH (ref 0.44–1.00)
GFR, Estimated: 17 mL/min — ABNORMAL LOW (ref >60)
Glucose, Bld: 112 mg/dL — ABNORMAL HIGH (ref 70–99)
Potassium: 4 mmol/L (ref 3.5–5.1)
Sodium: 133 mmol/L — ABNORMAL LOW (ref 135–145)

## 2023-09-14 LAB — MAGNESIUM: Magnesium: 2.2 mg/dL (ref 1.7–2.4)

## 2023-09-14 MED ORDER — LOPERAMIDE HCL 2 MG PO CAPS
4.0000 mg | ORAL_CAPSULE | Freq: Four times a day (QID) | ORAL | Status: DC
  Administered 2023-09-14 – 2023-09-15 (×2): 4 mg via ORAL
  Filled 2023-09-14 (×2): qty 2

## 2023-09-14 MED ORDER — SODIUM CHLORIDE 0.9 % IV SOLN: INTRAVENOUS | Status: AC

## 2023-09-14 MED ORDER — SODIUM CHLORIDE 0.9 % IV SOLN
12.5000 mg | Freq: Four times a day (QID) | INTRAVENOUS | Status: DC | PRN
  Administered 2023-09-14: 12.5 mg via INTRAVENOUS
  Filled 2023-09-14 (×2): qty 0.5

## 2023-09-14 MED ORDER — DIPHENOXYLATE-ATROPINE 2.5-0.025 MG PO TABS
2.0000 | ORAL_TABLET | Freq: Two times a day (BID) | ORAL | Status: DC
  Administered 2023-09-15: 2 via ORAL
  Filled 2023-09-14: qty 2

## 2023-09-14 MED ORDER — CALCIUM POLYCARBOPHIL 625 MG PO TABS
1250.0000 mg | ORAL_TABLET | Freq: Two times a day (BID) | ORAL | Status: DC
  Administered 2023-09-15: 1250 mg via ORAL
  Filled 2023-09-14 (×2): qty 2

## 2023-09-14 NOTE — Progress Notes (Signed)
 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 Due to severity of acute kidney failure and acidosis, PCCM was consulted for ICU admission and further management along with nephrology consult.   Consultants: Critical care medicine.  Nephrology  Procedures: CRRT while in ICU.  Hemodialysis catheter placement.    Subjective/Interval History: Patient mentioned that she is feeling nauseated this morning and then had a small episode of emesis.  Denies any abdominal pain.  It looks like her ileostomy output has decreased but it remains unclear since no charting has been done.  Discussed with nursing staff.     Assessment/Plan:  Acute kidney injury secondary to ATN in the setting of shock/acute metabolic acidosis/electrolyte abnormalities with hypokalemia hypomagnesemia and hyponatremia Presented with creatinine of 10 and a potassium of 6.6. Was seen by nephrology.  HD catheter was placed and patient underwent CRRT. Creatinine  creatinine has improved.  She was noted to have reasonable urine output.  Renal function was normal back in January but has been greater than 2 since February.  Creatinine noted to be slightly higher today.  Will give her IV fluids. Nephrology has signed off.  Dialysis catheter has been removed. Her high output from ostomy predisposes her to hypovolemia leading to renal failure. Sodium level has improved.  Noted to be on salt tablets.   Potassium level has improved as well.  Magnesium 2.2.  Septic shock due to urinary tract infection/chronic hypotension Noted to be on Zosyn.  No positive cultures noted.  However she does have a complicated urinary tract with presence of gastrostomy tube. Will need Zosyn for at least 7 to 10 days.  Looks like Zosyn was initiated on 3/22.  Prior to that she was on ceftriaxone.  Will complete course of antibiotics on/1. She is noted to be on midodrine.  Has low blood pressures but she is asymptomatic for the most part.    History of small bowel obstruction with recurrent SBO's Has had multiple surgeries previously mostly done at [facility].  Currently has ileostomy. Surgeon at [facility] has said that there was no role for reversing her ileostomy.   Surgery here was consulted since husband wanted another opinion.  They also do not plan any surgical interventions at this time.  Ileostomy reversal will be high risk. This can be further pursued in the outpatient setting.  High output ostomy This is a main reason why she became hypovolemic and went into renal failure. Noted to be on colestyramine, Lomotil and Imodium, FiberCon. There was no significant improvement in the ileostomy output.  Imodium dose was increased.  She was given octreotide yesterday.  It appears that output has improved  though unclear.  Have asked nursing staff to document output going forward.   Patient experiencing some nausea and vomiting this morning.  Could be from the octreotide.  Monitor for now.   May need to discontinue it.    [facility].  This was changed by IR on 3/24.    Severe malnutrition/use of TPN during previous hospitalizations at [facility] Against the use of NG tube and has been refusing TNF as well. Oral intake has improved in the last few days.  Continue to encourage.  Acute metabolic encephalopathy This is in the setting of electrolyte abnormalities and renal failure.  Gradually improving.  No focal deficits.  Hypoglycemia She was noted to have low glucose levels.  Was noted to be on insulin though she denied history of diabetes.  HbA1c was 5.7.  Insulin products were discontinued.  Hypoglycemia appears to have resolved.    Normocytic anemia and mild thrombocytopenia Low hemoglobin noted but no evidence of overt bleeding.  Has been stable for the last several days.  Check periodically. Platelet counts have improved.Marland Kitchen  History of neuropathic pain Noted to be on gabapentin.  Hypothyroidism Continue levothyroxine.  Goals of care Palliative care is following.   Spoke at length to patient and her husband at bedside.  He seems quite frustrated with lack of progress.  He does not think that rehab will be beneficial since she has tried a month of rehab a few months ago without much improvement.  But he wants to discuss this further with his daughter and the palliative care.  After discussions regarding CODE STATUS patient to be changed over to DNR.  She does not want resuscitation or be put on life support.    DVT Prophylaxis: Subcutaneous heparin Code Status: Changed to DNR today after discussing with patient and her husband Family Communication: Discussed with patient.  Will discuss with husband later today. Disposition Plan: SNF when medically ready    Medications: Scheduled:  Chlorhexidine Gluconate Cloth  6 each Topical Daily   cholestyramine light  4 g Oral TID   vitamin B-12  1,000 mcg Oral Daily    diphenoxylate-atropine  2 tablet Oral BID   feeding supplement  237 mL Oral TID BM   folic acid  1 mg Oral Daily   gabapentin  100 mg Oral Q8H   heparin  5,000 Units Subcutaneous Q8H   levothyroxine  25 mcg Oral Q0600   lipase/protease/amylase  24,000 Units Oral TID WC   loperamide  4 mg Oral Q6H   midodrine  15 mg Oral TID WC   multivitamin with minerals  1 tablet Oral Daily   octreotide  100 mcg Subcutaneous BID   polycarbophil  1,250 mg Oral BID   sodium chloride flush  3 mL Intravenous Q12H   sodium chloride  1 g Oral BID WC   thiamine  100 mg Oral Daily   Vitamin D (Ergocalciferol)  50,000 Units Oral Q7 days   Continuous:  sodium chloride 100 mL/hr at 09/14/23 0656   piperacillin-tazobactam (ZOSYN)  IV 2.25 g (09/14/23 0515)   promethazine (PHENERGAN) injection (IM or IVPB)     ZOX:[id number], ondansetron (ZOFRAN) IV, promethazine (PHENERGAN) injection (IM or IVPB)  Antibiotics: Anti-infectives (From admission, onward)    Start     Dose/Rate Route Frequency Ordered Stop   09/13/23 1600  fluconazole (DIFLUCAN) tablet 150 mg        150 mg Oral  Once 09/13/23 1507 09/13/23 1655  09/06/23 1400  piperacillin-tazobactam (ZOSYN) IVPB 2.25 g        2.25 g 100 mL/hr over 30 Minutes Intravenous Every 8 hours 09/06/23 0940 09/16/23 2359   09/05/23 0100  vancomycin (VANCOREADY) IVPB 500 mg/100 mL  Status:  Discontinued        500 mg 100 mL/hr over 60 Minutes Intravenous Every 24 hours 09/04/23 0128 09/06/23 0920   09/04/23 0000  piperacillin-tazobactam (ZOSYN) IVPB 3.375 g  Status:  Discontinued        3.375 g 100 mL/hr over 30 Minutes Intravenous Every 6 hours 09/03/23 2307 09/06/23 0940   09/03/23 2330  vancomycin (VANCOCIN) IVPB 1000 mg/200 mL premix        1,000 mg 200 mL/hr over 60 Minutes Intravenous  Once 09/03/23 2307 09/04/23 0215   09/03/23 2200  cefTRIAXone (ROCEPHIN) 1 g in sodium chloride 0.9 % 100 mL IVPB  Status:  Discontinued        1 g 200 mL/hr over 30  Minutes Intravenous Every 24 hours 09/03/23 2121 09/03/23 2249   09/03/23 2200  fluconazole (DIFLUCAN) IVPB 400 mg  Status:  Discontinued        400 mg 100 mL/hr over 120 Minutes Intravenous Every 24 hours 09/03/23 2121 09/04/23 1627       Objective:  Vital Signs  Vitals:   09/13/23 0453 09/13/23 2035 09/14/23 0507 09/14/23 0829  BP: (!) 100/58 102/62 100/67 112/70  Pulse: 76  69   Resp: 19  14   Temp: 97.9 F (36.6 C) 98.3 F (36.8 C) 98.4 F (36.9 C)   TempSrc: Oral Oral Oral Oral  SpO2: 100%  100%   Weight: 44.3 kg  43.4 kg   Height:        Intake/Output Summary (Last 24 hours) at 09/14/2023 0948 Last data filed at 09/14/2023 0100 Gross per 24 hour  Intake 360 ml  Output 1025 ml  Net -665 ml   Filed Weights   09/12/23 0400 09/13/23 0453 09/14/23 0507  Weight: 43.4 kg 44.3 kg 43.4 kg    General appearance: Awake alert.  In no distress Resp: Clear to auscultation bilaterally.  Normal effort Cardio: S1-S2 is normal regular.  No S3-S4.  No rubs murmurs or bruit GI: Abdomen is soft.  Ileostomy is noted.  Nephrostomy tube is noted. Extremities: No edema.     Lab Results:  Data Reviewed: I have personally reviewed following labs and reports of the imaging studies  CBC: Recent Labs  Lab 09/09/23 0436 09/10/23 0404 09/12/23 0524 09/14/23 0509  WBC 13.9* 7.1 3.9* 5.0  HGB 6.7* 7.9* 7.4* 8.8*  HCT 19.0* 22.2* 21.9* 25.6*  MCV 96.0 93.3 96.9 95.5  PLT 159 133* 146* 210    Basic Metabolic Panel: Recent Labs  Lab 09/09/23 0436 09/10/23 0404 09/10/23 0956 09/11/23 0425 09/11/23 1610 09/12/23 0524 09/13/23 0239 09/13/23 0600 09/13/23 1657 09/14/23 0509  NA 130* 134*  --  135  --  133* 135  --   --  133*  K 3.1* 2.6*   < > 3.1* 3.0* 3.0* 2.7*  --  3.8 4.0  CL 92* 93*  --  92*  --  92* 89*  --   --  88*  CO2 27 29  --  34*  --  31 34*  --   --  31  GLUCOSE 92 71  --  74  --  139* 80  --   --  112*  BUN 38* 34*  --  31*  --  29* 30*  --   --  38*   CREATININE 3.02* 2.71*  --  2.42*  --  2.41* 2.39*  --   --  2.92*  CALCIUM 8.2* 8.6*  --  8.5*  --  8.5* 9.0  --   --  9.4  MG 2.1 2.0  --  1.4*  --  2.1  --  1.7  --  2.2  PHOS 2.8 2.9  --  2.8  --  2.9 2.9  --   --   --    < > = values in this interval not displayed.    GFR: Estimated Creatinine Clearance: 12.8 mL/min (A) (by C-G formula based on SCr of 2.92 mg/dL (H)).  Liver Function Tests: Recent Labs  Lab 09/09/23 0436 09/10/23 0404 09/11/23 0425 09/12/23 0524 09/13/23 0239  ALBUMIN 2.1* 2.0* 1.9* 1.9* 2.2*    CBG: Recent Labs  Lab 09/12/23 2143 09/13/23 0755 09/13/23 1203 09/13/23 1644 09/13/23 2132  GLUCAP 128* 107* 171* 234* 173*     Recent Results (from the past 240 hours)  Urine Culture (for pregnant, neutropenic or urologic patients or patients with an indwelling urinary catheter)     Status: Abnormal   Collection Time: 09/05/23  1:15 PM   Specimen: Urine, Catheterized  Result Value Ref Range Status   Specimen Description URINE, CATHETERIZED  Final   Special Requests   Final    NONE Performed at [facility], [address] [place], [place] [address]    Culture 50,000 COLONIES/mL YEAST (A)  Final   Report Status 09/06/2023 FINAL  Final  Gastrointestinal Panel by PCR , Stool     Status: None   Collection Time: 09/06/23  9:26 AM   Specimen: Ileostomy; Stool  Result Value Ref Range Status   Campylobacter species NOT DETECTED NOT DETECTED Final   Plesimonas shigelloides NOT DETECTED NOT DETECTED Final   Salmonella species NOT DETECTED NOT DETECTED Final   Yersinia enterocolitica NOT DETECTED NOT DETECTED Final   Vibrio species NOT DETECTED NOT DETECTED Final   Vibrio cholerae NOT DETECTED NOT DETECTED Final   Enteroaggregative E coli (EAEC) NOT DETECTED NOT DETECTED Final   Enteropathogenic E coli (EPEC) NOT DETECTED NOT DETECTED Final   Enterotoxigenic E coli (ETEC) NOT DETECTED NOT DETECTED Final   Shiga like toxin producing E coli (STEC)  NOT DETECTED NOT DETECTED Final   Shigella/Enteroinvasive E coli (EIEC) NOT DETECTED NOT DETECTED Final   Cryptosporidium NOT DETECTED NOT DETECTED Final   Cyclospora cayetanensis NOT DETECTED NOT DETECTED Final   Entamoeba histolytica NOT DETECTED NOT DETECTED Final   Giardia lamblia NOT DETECTED NOT DETECTED Final   Adenovirus F40/41 NOT DETECTED NOT DETECTED Final   Astrovirus NOT DETECTED NOT DETECTED Final   Norovirus GI/GII NOT DETECTED NOT DETECTED Final   Rotavirus A NOT DETECTED NOT DETECTED Final   Sapovirus (I, II, IV, and V) NOT DETECTED NOT DETECTED Final    Comment: Performed at [facility], [address] [place], [place] [address]      Radiology Studies: No results found.     LOS: 11 days   Madysun Thall [employer] on [email or web address]  09/14/2023, 9:48 AM

## 2023-09-14 NOTE — Plan of Care (Signed)
  Problem: Fluid Volume: Goal: Ability to maintain a balanced intake and output will improve Outcome: Progressing   Problem: Metabolic: Goal: Ability to maintain appropriate glucose levels will improve Outcome: Progressing   Problem: Nutritional: Goal: Maintenance of adequate nutrition will improve Outcome: Progressing Goal: Progress toward achieving an optimal weight will improve Outcome: Progressing   Problem: Clinical Measurements: Goal: Cardiovascular complication will be avoided Outcome: Progressing   Problem: Activity: Goal: Risk for activity intolerance will decrease Outcome: Progressing   Problem: Nutrition: Goal: Adequate nutrition will be maintained Outcome: Progressing   Problem: Coping: Goal: Level of anxiety will decrease Outcome: Progressing   Problem: Elimination: Goal: Will not experience complications related to bowel motility Outcome: Progressing Goal: Will not experience complications related to urinary retention Outcome: Progressing   Problem: Pain Managment: Goal: General experience of comfort will improve and/or be controlled Outcome: Progressing   Problem: Safety: Goal: Ability to remain free from injury will improve Outcome: Progressing

## 2023-09-14 NOTE — Plan of Care (Signed)

## 2023-09-15 ENCOUNTER — Inpatient Hospital Stay (HOSPITAL_COMMUNITY)

## 2023-09-15 DIAGNOSIS — Z7189 Other specified counseling: Secondary | ICD-10-CM | POA: Diagnosis not present

## 2023-09-15 DIAGNOSIS — N179 Acute kidney failure, unspecified: Secondary | ICD-10-CM | POA: Diagnosis not present

## 2023-09-15 DIAGNOSIS — Z66 Do not resuscitate: Secondary | ICD-10-CM | POA: Diagnosis not present

## 2023-09-15 DIAGNOSIS — R112 Nausea with vomiting, unspecified: Secondary | ICD-10-CM

## 2023-09-15 DIAGNOSIS — Z515 Encounter for palliative care: Secondary | ICD-10-CM | POA: Diagnosis not present

## 2023-09-15 DIAGNOSIS — D649 Anemia, unspecified: Secondary | ICD-10-CM | POA: Diagnosis not present

## 2023-09-15 LAB — BASIC METABOLIC PANEL WITH GFR
Anion gap: 15 (ref 5–15)
BUN: 33 mg/dL — ABNORMAL HIGH (ref 8–23)
CO2: 23 mmol/L (ref 22–32)
Calcium: 9.2 mg/dL (ref 8.9–10.3)
Chloride: 99 mmol/L (ref 98–111)
Creatinine, Ser: 2.64 mg/dL — ABNORMAL HIGH (ref 0.44–1.00)
GFR, Estimated: 19 mL/min — ABNORMAL LOW (ref >60)
Glucose, Bld: 98 mg/dL (ref 70–99)
Potassium: 3.9 mmol/L (ref 3.5–5.1)
Sodium: 137 mmol/L (ref 135–145)

## 2023-09-15 LAB — GLUCOSE, CAPILLARY
Glucose-Capillary: 138 mg/dL — ABNORMAL HIGH (ref 70–99)
Glucose-Capillary: 105 mg/dL — ABNORMAL HIGH (ref 70–99)
Glucose-Capillary: 147 mg/dL — ABNORMAL HIGH (ref 70–99)
Glucose-Capillary: 137 mg/dL — ABNORMAL HIGH (ref 70–99)
Glucose-Capillary: 220 mg/dL — ABNORMAL HIGH (ref 70–99)

## 2023-09-15 MED ORDER — CALCIUM POLYCARBOPHIL 625 MG PO TABS
625.0000 mg | ORAL_TABLET | Freq: Two times a day (BID) | ORAL | Status: DC
  Administered 2023-09-16 – 2023-09-19 (×7): 625 mg via ORAL
  Filled 2023-09-15 (×8): qty 1

## 2023-09-15 MED ORDER — SODIUM CHLORIDE 0.9 % IV SOLN: INTRAVENOUS | Status: AC

## 2023-09-15 MED ORDER — CHOLESTYRAMINE LIGHT 4 G PO PACK
4.0000 g | PACK | Freq: Two times a day (BID) | ORAL | Status: DC
  Administered 2023-09-16 – 2023-09-19 (×6): 4 g via ORAL
  Filled 2023-09-15 (×7): qty 1

## 2023-09-15 MED ORDER — POTASSIUM CHLORIDE CRYS ER 20 MEQ PO TBCR
40.0000 meq | EXTENDED_RELEASE_TABLET | Freq: Once | ORAL | Status: AC
  Administered 2023-09-15: 40 meq via ORAL
  Filled 2023-09-15: qty 2

## 2023-09-15 MED ORDER — LOPERAMIDE HCL 2 MG PO CAPS
4.0000 mg | ORAL_CAPSULE | Freq: Four times a day (QID) | ORAL | Status: DC
  Administered 2023-09-15 – 2023-09-19 (×14): 4 mg via ORAL
  Filled 2023-09-15 (×14): qty 2

## 2023-09-15 NOTE — Progress Notes (Addendum)
 Daily Progress Note   Patient Name: [name]       Date: 09/15/2023 DOB: 1955/10/26  Age: 68 y.o. MRN#: [id number] Attending Physician: Osvaldo Shipper, MD Primary Care Physician: Patient, [name] Admit Date: 09/03/2023 Length of Stay: 12 days  Reason for Consultation/Follow-up: Establishing goals of care  HPI/Patient Profile:  68 y.o. 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  She presented from outpatient nephrology with critical labs including creatinine of 10.15.  She was admitted on 09/03/2023 with AKI due to ischemic ATN from septic shock, multiple electrolyte abnormalities, septic shock due to urinary tract infection, acute metabolic encephalopathy due to sepsis and uremia, history of recurrent SBO, severe malnutrition, high output ostomy, and others.    Palliative medicine was consulted for GOC conversations.  Subjective:   Subjective: Chart Reviewed. Updates received. Patient Assessed. Created space and opportunity for patient  and family to explore thoughts and feelings regarding current medical situation.  Today's Discussion: Today I saw the patient at bedside, husband present.  The patient had a scalp on her face and seems agitated and frustrated with PT/OT getting her out of the bed today.  I spoke with the husband and we discussed meeting together with his daughter sometime today to try to come to a decision for plan moving forward.  He agreed that he would be here likely until 3:00, possibly 5:00.  I called his daughter [name] and we agreed on a time for  1:00 today.  At 1:00 in the afternoon I met with the patient's husband [name] and daughter [name] in the family room.  Also present was the patient's nurse Kaitlin Blossom, RN.  We had a very extensive discussion refreshing on the patient's current situation, her trajectory over the past year, complications along the way.  We discussed quality of life and how it is dramatically declined since having her surgery in September.  We discussed available options including TPN and home fluids which we put in the context of artificial life support which would prolong her life but not improve her quality of life noting that she would likely be in the current state/quality of life or worse until she eventually did pass.  Family spent some time in a bargaining state, trying to identify why and how he got to this.  We allowed space and opportunity for venting and frustrations.  At the end of the conversation we were able to nicely come back to current state and plan moving forward.  After much discussion we settled on engaging with hospice through [facility] for consideration of home with home hospice.  Family is clear that they want to wait until tomorrow for the referral.  They worry about not including her in this decision.  However, we had more discussion about the patient's lack of capacity and her difficulty understanding her current situation.  None of this would not generally make sense to her.  We finalized that our plan would be for palliative to visit tomorrow morning and explained to the patient that we have a plan to try to get her home, encouraging her to eat and drink as much as  possible to stay nourished and hydrated, having help, and occasionally to assist her husband and making sure she is comfortable and to try to prevent her from having to come back to the hospital.  At this point we would refer for hospice to speak with the patient's husband and arrange services.  All are on board with this plan.   At that point I excused myself from the family meeting to allow the family to return to the patient's room and spend time with her.  I shared that I would be back tomorrow as previously planned.  I provided clinic with a copy of our contact card and information.  I confirmed that [name] still has our contact information.  I provided emotional and general support through therapeutic listening, empathy, sharing of stories, and other techniques. I answered all questions and addressed all concerns to the best of my ability.  After the meeting and visit I debriefed with the medical team, nursing team, and TOC.  Review of Systems  Respiratory:  Negative for shortness of breath.   Cardiovascular:  Negative for chest pain.  Gastrointestinal:  Positive for nausea. Negative for abdominal pain and vomiting.    Objective:   Vital Signs:  BP 120/80 (BP Location: Right Arm)   Pulse 68   Temp 98 F (36.7 C) (Oral)   Resp 13   Ht 5\' 2"  (1.575 m)   Wt 44 kg   SpO2 98%   BMI 17.74 kg/m   Physical Exam Vitals and nursing note reviewed.  Constitutional:      General: She is not in acute distress.    Appearance: She is ill-appearing.  HENT:     Head: Normocephalic and atraumatic.  Cardiovascular:     Rate and Rhythm: Normal rate.  Pulmonary:     Effort: No respiratory distress.  Abdominal:     General: Abdomen is flat. There is no distension.     Palpations: Abdomen is soft.  Neurological:     General: No focal deficit present.     Mental Status: She is alert.  Psychiatric:        Mood and Affect: Mood normal.        Behavior: Behavior normal.     Palliative Assessment/Data: 40-50%    Existing Vynca/ACP Documentation: None  Assessment & Plan:   Impression: Present on Admission:  Acute kidney injury (HCC)  68 year old female with acute presentation of chronic comorbidities as described above. The patient has an extensive surgical history as noted above. She has a high output  ostomy which they were hoping to get reversed, but surgery here has recommended she return to [facility]. At last visit Dr. [name] at [facility] counseled about high risk and patient was quite hesitant, but Dr. [name] offered to revisit in the future if desired.  Despite all medical attempts to slow down ostomy output and provide for a good quality of life we have been unsuccessful.  At this point our options are TPN and fluids at home as "artificial support" to prolong her life versus comfort care.  After substantial discussion family has elected comfort care with an explanation to the patient in a more paternal sense (as noted above) due to her lack of capacity to participate in decision-making. Overall prognosis poor   SUMMARY OF RECOMMENDATIONS   DNR Continue current scope of care Will explain the plan to the patient tomorrow per husband's request to wait until then Anticipate referral to [facility] for home hospice Palliative medicine will follow-up  tomorrow  Symptom Management:  Per primary team PMT is available to assist as needed  Code Status: Full code  Prognosis: Unable to determine  Discharge Planning: To Be Determined  Discussed with: Patient, family, medical team, nursing team, [facility] team  Thank you for allowing [place] to participate in the care of [name] PMT will continue to support holistically.  Time Total: 138 min  Detailed review of medical records (labs, imaging, vital signs), medically appropriate exam, discussed with treatment team, counseling and education to patient, family, & staff, documenting clinical information, medication management, coordination of care  Wynne Dust, NP Palliative Medicine Team  Team Phone # [phone] (Nights/Weekends)  02/13/2021, 8:17 AM

## 2023-09-15 NOTE — Progress Notes (Signed)
 [facility] PROGRESS NOTE   [name] MWU:[id number] DOB: 02-22-1956 DOA: 09/03/2023  PCP: Patient, [name]  Brief History: 68 yr female with a significant past medical history for cervical cancer 2003 s/p hysterectomy, hx of TPN use/malnutrition, right hydronephrosis s/p uretal stent 03/2023 and R nephrostomy tube placed in 04/2023 due to hospitalization at [facility] for obstructive uropathy AKI and BL hydronephrosis complicated by Enterobacter and Candida UTI, rectovaginal s/p anterior resection with colovaginal fistula repair, loop ileostomy with closure in 2021, SBO s/p with lysis of adhesions, recurrent SBO, ileum/cecum resection with ileostomy creation 2024, and recent [facility] hospitalization (2/14-2/23/25) which revealed patient had severe dehydration in the setting of high output and poor oral intake who presents to [facility] ED from [facility] due to abnormal lab work indicating acute renal failure (Cr >10, BUN >169, Anion Gap acidosis, severe hyperkalemia-k>6, and hyponatremia Na 117. Due to severity of acute kidney failure and acidosis, PCCM was consulted for ICU admission and further management along with nephrology consult.   Consultants: Critical care medicine.  Nephrology  Procedures: CRRT while in ICU.  Hemodialysis catheter placement.    Subjective/Interval History: Patient has some nausea this morning.  Denies any abdominal pain.  Feels a bit queasy.  Did not have much oral intake yesterday due to vomiting.  No emesis since yesterday morning.      Assessment/Plan:  Acute kidney injury secondary to ATN in the setting of shock/acute metabolic acidosis/electrolyte abnormalities with hypokalemia hypomagnesemia and hyponatremia Presented with creatinine of 10 and a potassium of 6.6. Was seen by nephrology.  HD catheter was placed and patient underwent CRRT. Renal function was normal back in January but has been greater than 2 since February.  Creatinine  had improved but noted to have worsened yesterday possibly due to poor oral intake and persistent high ileostomy output.  Nausea and vomiting likely also contributed.  She was given IV fluids with improvement in creatinine noted today.  Continue IV fluids for another 24 hours. She was given octreotide for her high output ileostomy as discussed below and that seems to have decreased the output.  Hopefully her renal function will stabilize.  Monitor urine output. Nephrology has signed off.  Dialysis catheter has been removed. Sodium level has improved.  Noted to be on salt tablets.   Potassium level has improved as well.  Magnesium 2.2 on 3/30  Septic shock due to urinary tract infection/chronic hypotension Noted to be on Zosyn.  No positive cultures noted.  However she does have a complicated urinary tract with presence of gastrostomy tube. Will need Zosyn for at least 7 to 10 days.  Looks like Zosyn was initiated on 3/22.  Prior to that she was on ceftriaxone.  Will complete course of antibiotics on 4/1. She is noted to be on midodrine.  Has low blood pressures but she is asymptomatic for the most part.    History of small bowel obstruction with recurrent SBO's Has had multiple surgeries previously mostly done at [facility].  Currently has ileostomy. Surgeon at [facility] has said that there was no role for reversing her ileostomy.   Surgery here was consulted since husband wanted another opinion.  They also do not plan any surgical interventions at this time.  Ileostomy reversal will be high risk. This can be further pursued in the outpatient setting.  High output ostomy This is a main reason why she became hypovolemic and went into renal failure. Noted to be on colestyramine, Lomotil and  Imodium, FiberCon. There was no significant improvement in the ileostomy output.  Imodium dose was increased.   She was subsequently given octreotide which seems to have helped but caused a lot of nausea and  vomiting so it was held yesterday.   Output appears to have significantly improved.  Continue to monitor.  Nausea and vomiting Possibly due to octreotide.  No abdominal tenderness.  No emesis in the last 24 hours.  Dietary challenge today.  Octreotide has been discontinued.    [facility].  This was changed by IR on 3/24.  Outpatient follow-up with her providers at [facility]  Severe malnutrition/use of TPN during previous hospitalizations at [facility] Against the use of NG tube and has been refusing TNF as well. Oral intake had improved but then worsened due to nausea vomiting.  Hopefully it will improve going forward. Continue to encourage.  Acute metabolic encephalopathy This is in the setting of electrolyte abnormalities and renal failure.  Seems to have improved.  Could be close to baseline.  Hypoglycemia She was noted to have low glucose levels.  Was noted to be on insulin though she denied history of diabetes.  HbA1c was 5.7.  Insulin products were discontinued.   Hypoglycemia appears to have resolved.    Normocytic anemia and mild thrombocytopenia Low hemoglobin noted but no evidence of overt bleeding.  Has been stable for the last several days.  Check periodically. Platelet counts have improved.Marland Kitchen  History of neuropathic pain Noted to be on gabapentin.  Hypothyroidism Continue levothyroxine.  Goals of care Palliative care has been following.   Spoke at length to patient and her husband at bedside on 3/30.  He seems quite frustrated with lack of progress.  He does not think that rehab will be beneficial since she has tried a month of rehab a few months ago without much improvement.  But he wants to discuss this further with his daughter and the palliative care.  After discussions regarding CODE STATUS patient to be changed over to DNR.  She does not want resuscitation or be put on life support.    DVT Prophylaxis:  Subcutaneous heparin Code Status: DNR Family Communication: Discussed with patient.  Daughter is requesting a call. Disposition Plan: SNF when medically ready    Medications: Scheduled:  Chlorhexidine Gluconate Cloth  6 each Topical Daily   cholestyramine light  4 g Oral TID   vitamin B-12  1,000 mcg Oral Daily   diphenoxylate-atropine  2 tablet Oral BID   feeding supplement  237 mL Oral TID BM   folic acid  1 mg Oral Daily   gabapentin  100 mg Oral Q8H   heparin  5,000 Units Subcutaneous Q8H   levothyroxine  25 mcg Oral Q0600   lipase/protease/amylase  24,000 Units Oral TID WC   loperamide  4 mg Oral Q6H   midodrine  15 mg Oral TID WC   multivitamin with minerals  1 tablet Oral Daily   polycarbophil  1,250 mg Oral BID   sodium chloride flush  3 mL Intravenous Q12H   sodium chloride  1 g Oral BID WC   thiamine  100 mg Oral Daily   Vitamin D (Ergocalciferol)  50,000 Units Oral Q7 days   Continuous:  sodium chloride     piperacillin-tazobactam (ZOSYN)  IV 2.25 g (09/15/23 0642)   promethazine (PHENERGAN) injection (IM or IVPB) Stopped (09/14/23 1058)   LKG:[id number], ondansetron (ZOFRAN) IV, promethazine (PHENERGAN) injection (IM or IVPB)  Antibiotics: Anti-infectives (From admission, onward)    Start     Dose/Rate Route Frequency Ordered Stop   09/13/23 1600  fluconazole (DIFLUCAN) tablet 150 mg        150 mg Oral  Once 09/13/23 1507 09/13/23 1655   09/06/23 1400  piperacillin-tazobactam (ZOSYN) IVPB 2.25 g        2.25 g 100 mL/hr over 30 Minutes Intravenous Every 8 hours 09/06/23 0940 09/16/23 2359   09/05/23 0100  vancomycin (VANCOREADY) IVPB 500 mg/100 mL  Status:  Discontinued        500 mg 100 mL/hr over 60 Minutes Intravenous Every 24 hours 09/04/23 0128 09/06/23 0920   09/04/23 0000  piperacillin-tazobactam (ZOSYN) IVPB 3.375 g  Status:  Discontinued        3.375 g 100 mL/hr over 30 Minutes Intravenous Every 6 hours 09/03/23 2307 09/06/23 0940   09/03/23  2330  vancomycin (VANCOCIN) IVPB 1000 mg/200 mL premix        1,000 mg 200 mL/hr over 60 Minutes Intravenous  Once 09/03/23 2307 09/04/23 0215   09/03/23 2200  cefTRIAXone (ROCEPHIN) 1 g in sodium chloride 0.9 % 100 mL IVPB  Status:  Discontinued        1 g 200 mL/hr over 30 Minutes Intravenous Every 24 hours 09/03/23 2121 09/03/23 2249   09/03/23 2200  fluconazole (DIFLUCAN) IVPB 400 mg  Status:  Discontinued        400 mg 100 mL/hr over 120 Minutes Intravenous Every 24 hours 09/03/23 2121 09/04/23 1627       Objective:  Vital Signs  Vitals:   09/14/23 2111 09/15/23 0422 09/15/23 0425 09/15/23 0717  BP: 123/76 98/72 127/79 120/80  Pulse: 69 76 66 68  Resp: 12 13 13 13   Temp: 98.3 F (36.8 C) 98 F (36.7 C) 98 F (36.7 C) 98 F (36.7 C)  TempSrc: Oral Oral Oral Oral  SpO2: 98% 99% 98% 98%  Weight:   44 kg   Height:        Intake/Output Summary (Last 24 hours) at 09/15/2023 0850 Last data filed at 09/15/2023 0253 Gross per 24 hour  Intake 2127.27 ml  Output 1650 ml  Net 477.27 ml   Filed Weights   09/13/23 0453 09/14/23 0507 09/15/23 0425  Weight: 44.3 kg 43.4 kg 44 kg    General appearance: Awake alert.  In no distress Resp: Clear to auscultation bilaterally.  Normal effort Cardio: S1-S2 is normal regular.  No S3-S4.  No rubs murmurs or bruit GI: Abdomen is soft.  Mild discomfort with palpation.  Ileostomy is noted.  Bowel sounds present.  Lab Results:  Data Reviewed: I have personally reviewed following labs and reports of the imaging studies  CBC: Recent Labs  Lab 09/09/23 0436 09/10/23 0404 09/12/23 0524 09/14/23 0509  WBC 13.9* 7.1 3.9* 5.0  HGB 6.7* 7.9* 7.4* 8.8*  HCT 19.0* 22.2* 21.9* 25.6*  MCV 96.0 93.3 96.9 95.5  PLT 159 133* 146* 210    Basic Metabolic Panel: Recent Labs  Lab 09/09/23 0436 09/10/23 0404 09/10/23 0956 09/11/23 0425 09/11/23 1610 09/12/23 0524 09/13/23 0239 09/13/23 0600 09/13/23 1657 09/14/23 0509 09/15/23 0550   NA 130* 134*  --  135  --  133* 135  --   --  133* 137  K 3.1* 2.6*   < > 3.1*   < > 3.0* 2.7*  --  3.8 4.0 3.9  CL 92* 93*  --  92*  --  92* 89*  --   --  88* 99  CO2 27 29  --  34*  --  31 34*  --   --  31 23  GLUCOSE 92 71  --  74  --  139* 80  --   --  112* 98  BUN 38* 34*  --  31*  --  29* 30*  --   --  38* 33*  CREATININE 3.02* 2.71*  --  2.42*  --  2.41* 2.39*  --   --  2.92* 2.64*  CALCIUM 8.2* 8.6*  --  8.5*  --  8.5* 9.0  --   --  9.4 9.2  MG 2.1 2.0  --  1.4*  --  2.1  --  1.7  --  2.2  --   PHOS 2.8 2.9  --  2.8  --  2.9 2.9  --   --   --   --    < > = values in this interval not displayed.    GFR: Estimated Creatinine Clearance: 14.4 mL/min (A) (by C-G formula based on SCr of 2.64 mg/dL (H)).  Liver Function Tests: Recent Labs  Lab 09/09/23 0436 09/10/23 0404 09/11/23 0425 09/12/23 0524 09/13/23 0239  ALBUMIN 2.1* 2.0* 1.9* 1.9* 2.2*    CBG: Recent Labs  Lab 09/14/23 0836 09/14/23 1233 09/14/23 1631 09/14/23 2108 09/15/23 0715  GLUCAP 138* 102* 104* 110* 105*     Recent Results (from the past 240 hours)  Urine Culture (for pregnant, neutropenic or urologic patients or patients with an indwelling urinary catheter)     Status: Abnormal   Collection Time: 09/05/23  1:15 PM   Specimen: Urine, Catheterized  Result Value Ref Range Status   Specimen Description URINE, CATHETERIZED  Final   Special Requests   Final    NONE Performed at [facility], [address] [place], [place] [address]    Culture 50,000 COLONIES/mL YEAST (A)  Final   Report Status 09/06/2023 FINAL  Final  Gastrointestinal Panel by PCR , Stool     Status: None   Collection Time: 09/06/23  9:26 AM   Specimen: Ileostomy; Stool  Result Value Ref Range Status   Campylobacter species NOT DETECTED NOT DETECTED Final   Plesimonas shigelloides NOT DETECTED NOT DETECTED Final   Salmonella species NOT DETECTED NOT DETECTED Final   Yersinia enterocolitica NOT DETECTED NOT DETECTED  Final   Vibrio species NOT DETECTED NOT DETECTED Final   Vibrio cholerae NOT DETECTED NOT DETECTED Final   Enteroaggregative E coli (EAEC) NOT DETECTED NOT DETECTED Final   Enteropathogenic E coli (EPEC) NOT DETECTED NOT DETECTED Final   Enterotoxigenic E coli (ETEC) NOT DETECTED NOT DETECTED Final   Shiga like toxin producing E coli (STEC) NOT DETECTED NOT DETECTED Final   Shigella/Enteroinvasive E coli (EIEC) NOT DETECTED NOT DETECTED Final   Cryptosporidium NOT DETECTED NOT DETECTED Final   Cyclospora cayetanensis NOT DETECTED NOT DETECTED Final   Entamoeba histolytica NOT DETECTED NOT DETECTED Final   Giardia lamblia NOT DETECTED NOT DETECTED Final   Adenovirus F40/41 NOT DETECTED NOT DETECTED Final   Astrovirus NOT DETECTED NOT DETECTED Final   Norovirus GI/GII NOT DETECTED NOT DETECTED Final   Rotavirus A NOT DETECTED NOT DETECTED Final   Sapovirus (I, II, IV, and V) NOT DETECTED NOT DETECTED Final    Comment: Performed at [facility], [address] [place], [place] [address]      Radiology Studies: No results found.     LOS: 12 days  Aunika Kirsten [employer]  [facility] [employer] designer on [email or web address]  09/15/2023, 8:50 AM

## 2023-09-15 NOTE — Progress Notes (Signed)
 Physical Therapy Treatment Patient Details Name: [name] MRN: [id number] DOB: Oct 27, 1955 Today's Date: 09/15/2023   History of Present Illness 68 y.o. female presents to [facility] 09/03/23 from [facility] due to abnormal lab work. Pt admitted with acute kidney failure, acidosis, and acute encephalopathy. Suspected uretal obstruction vs. Severe dehydration vs. Urosepsis etiology. 3/20 began CRRT. PMHx: atrophic kidney, recurrent AKI, cervical cancer, colovaginal fistula, pyelonephritis, renal atrophy, vertigo    PT Comments  Pt greeted supine in bed, husband present, agreeable to PT session. She was easily distracted and became increasingly irritated as the session progressed. Pt required increased time to perform functional mobility and moderate cueing for sequencing/technique and safety. Pt increased gait distance, ambulating ~68ft using RW with CGA and a chair follow. She c/o BLE weakness and fatigue. Pt became tachycardic with gait, HRmax 143bpm, RN notified. Patient will benefit from continued inpatient follow up therapy, <3 hours/day.     If plan is discharge home, recommend the following: Assistance with cooking/housework;Assist for transportation;Help with stairs or ramp for entrance;A little help with walking and/or transfers;A little help with bathing/dressing/bathroom   Can travel by private vehicle     Yes  Equipment Recommendations  None recommended by PT    Recommendations for Other Services       Precautions / Restrictions Precautions Precautions: Fall Recall of Precautions/Restrictions: Impaired Precaution/Restrictions Comments: R nephrostomy, R UQ ileostomy Restrictions Weight Bearing Restrictions Per Provider Order: No     Mobility  Bed Mobility Overal bed mobility: Needs Assistance Bed Mobility: Supine to Sit     Supine to sit: HOB elevated, Min assist     General bed mobility comments: Pt sat up on L side of bed, brought BLE off EOB. She pulled on PT to  bring trunk upright and required minA at bed pad to scoot fwd til feet supported.    Transfers Overall transfer level: Needs assistance Equipment used: Rolling walker (2 wheels) Transfers: Sit to/from Stand Sit to Stand: Contact guard assist, Min assist, +2 safety/equipment, +2 physical assistance           General transfer comment: Pt stood from lowest bed height. VC for hand positioning and technqiue using RW. She powered up with CGA. Good eccentric control with sitting. Pt required minA x2 via bedpad to scoot back in recliner chair.    Ambulation/Gait Ambulation/Gait assistance: Contact guard assist, +2 safety/equipment (Chair Follow) Gait Distance (Feet): 50 Feet Assistive device: Rolling walker (2 wheels) Gait Pattern/deviations: Step-through pattern, Decreased stride length Gait velocity: reduced Gait velocity interpretation: <1.8 ft/sec, indicate of risk for recurrent falls   General Gait Details: Pt ambulated with reciprocal gait pattern. She had adequate foot clearence, heel strike, and push off when walking in her shoes insteady of just socks. Pt maintained body inside RW and navigated obstacles in room/hallway. She was able to advance BLE with less difficulty and had gaze looking ahead instead of fixed on the ground. Pt fatigued towards the end of ambulation reporting BLE weakness and becoming tachycardic. Ceased ambulation and returned pt to room via recliner chair.   Stairs             Wheelchair Mobility     Tilt Bed    Modified Rankin (Stroke Patients Only)       Balance Overall balance assessment: Needs assistance Sitting-balance support: No upper extremity supported, Feet supported Sitting balance-Leahy Scale: Fair Sitting balance - Comments: Pt sat EOB with Supervision.   Standing balance support: Bilateral upper extremity supported, During  functional activity, Reliant on assistive device for balance Standing balance-Leahy Scale: Poor Standing  balance comment: Pt dependent on RW for stability.                            Communication Communication Communication: No apparent difficulties  Cognition Arousal: Alert Behavior During Therapy: Flat affect   PT - Cognitive impairments: Memory, Attention, Initiation, Sequencing, Problem solving, Safety/Judgement                       PT - Cognition Comments: Pt requires moderate cueing for sequencing and to maintain safety.  She demonstrated little patience for staff management of lines/leads, throwing her gown and the cords around. Pt became more irritable as the session progressed. Two different MDs entered the room and began talking with the pt's husband which increased her frustration. Following commands: Impaired Following commands impaired: Only follows one step commands consistently, Follows one step commands with increased time    Cueing Cueing Techniques: Verbal cues, Tactile cues, Visual cues  Exercises      General Comments General comments (skin integrity, edema, etc.): BP stable without c/o lightheadedness or dizziness. HR tachycardic with gait, max of 143bpm, RN notified.      Pertinent Vitals/Pain Pain Assessment Pain Assessment: Faces Faces Pain Scale: Hurts little more Pain Location: L abdomen Pain Descriptors / Indicators: Aching, Discomfort, Sore, Tender Pain Intervention(s): Monitored during session, Limited activity within patient's tolerance    Home Living                          Prior Function            PT Goals (current goals can now be found in the care plan section) Acute Rehab PT Goals Patient Stated Goal: Feel better Progress towards PT goals: Progressing toward goals    Frequency    Min 2X/week      PT Plan      Co-evaluation              AM-PAC PT "6 Clicks" Mobility   Outcome Measure  Help needed turning from your back to your side while in a flat bed without using bedrails?: A  Little Help needed moving from lying on your back to sitting on the side of a flat bed without using bedrails?: A Little Help needed moving to and from a bed to a chair (including a wheelchair)?: Total Help needed standing up from a chair using your arms (e.g., wheelchair or bedside chair)?: Total Help needed to walk in hospital room?: Total Help needed climbing 3-5 steps with a railing? : Total 6 Click Score: 10    End of Session Equipment Utilized During Treatment: Gait belt Activity Tolerance: Patient tolerated treatment well;Patient limited by fatigue Patient left: in chair;with call bell/phone within reach;with chair alarm set;with family/visitor present Nurse Communication: Mobility status;Other (comment) (HR during session) PT Visit Diagnosis: Other abnormalities of gait and mobility (R26.89);Muscle weakness (generalized) (M62.81)     Time: 1610-9604 PT Time Calculation (min) (ACUTE ONLY): 21 min  Charges:    $Gait Training: 8-22 mins PT General Charges $$ ACUTE PT VISIT: 1 Visit                     Cheri Guppy, PT, DPT Acute Rehabilitation Services Office: [phone] Secure Chat Preferred  Richardson Chiquito 09/15/2023, 11:23 AM

## 2023-09-16 DIAGNOSIS — R112 Nausea with vomiting, unspecified: Secondary | ICD-10-CM | POA: Diagnosis not present

## 2023-09-16 DIAGNOSIS — Z7189 Other specified counseling: Secondary | ICD-10-CM | POA: Diagnosis not present

## 2023-09-16 DIAGNOSIS — Z66 Do not resuscitate: Secondary | ICD-10-CM | POA: Diagnosis not present

## 2023-09-16 DIAGNOSIS — Z515 Encounter for palliative care: Secondary | ICD-10-CM | POA: Diagnosis not present

## 2023-09-16 DIAGNOSIS — D649 Anemia, unspecified: Secondary | ICD-10-CM | POA: Diagnosis not present

## 2023-09-16 DIAGNOSIS — N179 Acute kidney failure, unspecified: Secondary | ICD-10-CM | POA: Diagnosis not present

## 2023-09-16 LAB — BASIC METABOLIC PANEL WITH GFR
Anion gap: 10 (ref 5–15)
BUN: 29 mg/dL — ABNORMAL HIGH (ref 8–23)
CO2: 27 mmol/L (ref 22–32)
Calcium: 9 mg/dL (ref 8.9–10.3)
Chloride: 100 mmol/L (ref 98–111)
Creatinine, Ser: 2.53 mg/dL — ABNORMAL HIGH (ref 0.44–1.00)
GFR, Estimated: 20 mL/min — ABNORMAL LOW (ref >60)
Glucose, Bld: 117 mg/dL — ABNORMAL HIGH (ref 70–99)
Potassium: 3.6 mmol/L (ref 3.5–5.1)
Sodium: 137 mmol/L (ref 135–145)

## 2023-09-16 LAB — GLUCOSE, CAPILLARY
Glucose-Capillary: 120 mg/dL — ABNORMAL HIGH (ref 70–99)
Glucose-Capillary: 201 mg/dL — ABNORMAL HIGH (ref 70–99)
Glucose-Capillary: 124 mg/dL — ABNORMAL HIGH (ref 70–99)
Glucose-Capillary: 171 mg/dL — ABNORMAL HIGH (ref 70–99)

## 2023-09-16 MED ORDER — PANCRELIPASE (LIP-PROT-AMYL) 36000-114000 UNITS PO CPEP
36000.0000 [IU] | ORAL_CAPSULE | Freq: Three times a day (TID) | ORAL | Status: DC
  Administered 2023-09-16 – 2023-09-19 (×8): 36000 [IU] via ORAL
  Filled 2023-09-16 (×10): qty 1

## 2023-09-16 MED ORDER — ADULT MULTIVITAMIN W/MINERALS CH
1.0000 | ORAL_TABLET | Freq: Two times a day (BID) | ORAL | Status: DC
  Administered 2023-09-16 – 2023-09-19 (×6): 1 via ORAL
  Filled 2023-09-16 (×6): qty 1

## 2023-09-16 MED ORDER — SODIUM CHLORIDE 0.9 % IV SOLN: INTRAVENOUS | Status: AC

## 2023-09-16 MED ORDER — VITAMIN C 500 MG PO TABS
250.0000 mg | ORAL_TABLET | Freq: Two times a day (BID) | ORAL | Status: DC
  Administered 2023-09-16 – 2023-09-18 (×5): 250 mg via ORAL
  Filled 2023-09-16 (×5): qty 1

## 2023-09-16 NOTE — Progress Notes (Signed)
 Nutrition Follow-up  DOCUMENTATION CODES:   Severe malnutrition in context of chronic illness, Underweight (recurrent GI surgeries and hospitalizations (5 in the last 6 months), recurrent TPN use)  INTERVENTION:   Recommend trial of increasing dose of Creon with meals to see if this helps decrease ileostomy output given  fecal pancreatic elastase level returned at a level indicating possible severe pancreatic insufficiency (EPI). Discussed with Dr. Rito Ehrlich and Wynne Dust NP; plan to increase for now  Increase MVI with Minerals to BID given likely significant malabsorption. Pt may benefit from chewable form at discharge.   Contnue Thiamine, Folic Acid, B12, Vit D supplementation Add Vitamin C  NUTRITION DIAGNOSIS:   Severe Malnutrition related to chronic illness (recurrent GI surgeries and hospitalizations (5 in the last 6 months), recurrent TPN use) as evidenced by severe fat depletion, severe muscle depletion, percent weight loss.  Continues but being addressed  GOAL:   Patient will meet greater than or equal to 90% of their needs  Not Met  MONITOR:   Supplement acceptance, PO intake, Labs, Weight trends, I & O's  REASON FOR ASSESSMENT:   Consult Assessment of nutrition requirement/status, Poor PO  ASSESSMENT:   Pt with hx of cervical cancer, SBO (multiple), rectovaginal fistula, hydronephrosis, recurrent AKI, severe dehydration, chronic malnutrition and TPN use. Hx of urethral stent placement (03/2023), R nephrostomy placement (04/2023), anterior resection with colovaginal fistula repair (2021) and ileum cecum resection w/ ileostomy creation (2024). Pt admitted with acute kidney failure and acidosis w/ concerns for uretal obstruction sent by [facility].  3/19 Admitted, CRRT initated 3/21 CRRT discontinued 3/30 +N/V with 800 mL emesis, decreased oral intake, decreased ileostomy intake 3/31 Abd xray with distended stomach  Palliative Care continues to follow very  closely; pt now DNR, noted plan for hospice at home at discharge.  Husband at bedside; pt appears in good spirits, smiling.   Decreased oral intake over the last 2 days related to N/V, last episode of emesis on 3/30. Recorded po intake 0-20% on 3/30, nothing recorded 3/31. Previously recorded po intake 75-100%. Today, pt ate a good nreakfast  Noted pt refusing/not taking Ensure most of the times it is offered.   Decreased ileostomy output is likely directly associated with decreased oral intake. Today, pt eating better, RN reports pt with significantly increase output, upon assessment before lunch today, pt with 2L of output already  Noted pt continues to receive IV fluids; NS at 50 ml/hr ordered today x 12 hours  Current wt 42 kg (93 pounds); noted weight fluctuated between 43-44 kg (95-97 pounds) since 3/27 (post transfer out of ICU on 3/26). Weight on 3/26 38.6 kg (85 kg). Weight difference likely related to different bed scales.   Pancreatic Elastase (stool): 45 (L)- <100 indicative of severe pancreatic insuffiencey. Pt started on Creon with meals and thus far has not appeared to improve ileostomy output. However, question if dose is sufficient. Plan to discuss with D.   24 Hour Ileostomy Output:  2L thus far today before lunch 3/31-4/01 1375 mL + 1 unmeasured occurrence 3/30-3/31 0 mL (?)-noted emesis of 800 mL this day 3/29-3/30 875 mL (day shift, nothing measured on night shift) 3/28-3/29 3350 mL 3/27-3/28 3040 mL 3/26-3/27 4625 mL 3/25-3/26 5310 mL  3/24-3/25 3625 mL (30% at Breakfast, 75% of Dinner, lunch not recorded; noted po liquid intake almost 1L per I/O) 3/23-3/24 1145 mL (reduced oral intake this day-recorded po intake only 10%) 3/22-3/23 2950 mL (no intake recorded this day-unknown) 3/21-3/22 3100 mL (  ate 75-100% of meals)   UOP: 225 mL via R Nephrostomy with 1 unmeasured urine occurrence in 24 hours  Micronutrient Labs (09/04/23): CRP 0.8 (wdl)- normal CRP suggests  inflammation not impacting lab values Folate: 16.9 (wdl) Vitamin B12: 336 (low normal) Vitamin A: 52.3 (wdl) Vitamin C: 0.2 (L) Vitamin D: 17.33 (L) Vitamin E: gamma: 0.9, alpha 11.0 (wdl) Vitamin K: <0.01 (low serum value, did not check TG) Zinc: 55 (wdl-low normal)   Surgical History as outlined in General Surgery note today:  cervical cancer s/p radiation rectovaginal fistula s/p redo LAR with colovaginal fistula takedown, fistula repair and hand sewn end to end sigmoidrectal anastomosis with loop ileostomy 2020 loop ileostomy closure 2021 SBO s/p ex lap LOA 2022 ex lap LOA distal ileum and cecum resection with end ileostomy 02/2023  Labs: Sodium 137 (wdl) Potassium 3.1 (L) Creatinine 2.53(trending down) BUN 29  Magnesium 2.2 (wdl)   Meds:  Fibercon BID-dose decreased Imodium increased to 4 mg q 6 hours Cholestyramine light 4 g BID SS novolog Lantus MVI with Minerals daily Folic Acid IV thiamine complete- Thiamine po now Sodium chloride tabs-1 g BID Midodrine B12 Vit D-noted Nephrology changed Vit D dosing to every 7 days NS at 75 ml/hr Creon 24,000 units with meals  Diet Order:   Diet Order             Diet regular Fluid consistency: Thin  Diet effective now                   EDUCATION NEEDS:   Education needs have been addressed  Skin:  Skin Assessment: Reviewed RN Assessment  Last BM:  5 L stool via ileostomy in 24 hours  Height:   Ht Readings from Last 1 Encounters:  09/03/23 5\' 2"  (1.575 m)    Weight:   Wt Readings from Last 1 Encounters:  09/16/23 42 kg    Ideal Body Weight:  50 kg  BMI:  Body mass index is 16.94 kg/m.  Estimated Nutritional Needs:   Kcal:  1550-1750 kcals  Protein:  75-90 g  Fluid:  2L plus additional for high ileostomy output  Romelle Starcher MS, RDN, LDN, CNSC Registered Dietitian 3 Clinical Nutrition RD Inpatient Contact Info in Amion

## 2023-09-16 NOTE — TOC Progression Note (Signed)
 Transition of Care [facility]) - Progression Note    Patient Details  Name: MALAIA BUCHTA MRN: [id number] Date of Birth: 1956/05/29  Transition of Care [facility]) CM/SW Contact  Graves-Bigelow, Lamar Laundry, RN Phone Number: 09/16/2023, 12:42 PM  Clinical Narrative: Case Manager received a consult from the Palliative NP that the spouse is agreeable for home hospice with [facility]. Referral submitted to the agency with requests from spouse. Rae Halsted states that the admission assessment will be completed once the patient is discharged home. Gertie Exon will discuss DME with the spouse. No further needs identified at this time. Case Manager will continue to follow for additional needs.   Expected Discharge Plan: Home w Hospice Care Barriers to Discharge: Continued Medical Work up  Expected Discharge Plan and Services   Discharge Planning Services: CM Consult Post Acute Care Choice: Hospice Living arrangements for the past 2 months: Single Family Home  HH Arranged: RN [facility] Agency: [facility] ([facility]) Date HH Agency Contacted: 09/16/23 Time HH Agency Contacted: 1215 Representative spoke with at [facility] Agency: Deanna Artis  Social Determinants of Health (SDOH) Interventions SDOH Screenings   Food Insecurity: No Food Insecurity (09/04/2023)  Housing: Low Risk  (09/04/2023)  Transportation Needs: No Transportation Needs (09/04/2023)  Utilities: Not At Risk (09/04/2023)  Depression (PHQ2-9): Low Risk  (12/06/2022)  Financial Resource Strain: Low Risk  (08/11/2023)   Received from [facility]  Social Connections: Moderately Isolated (09/04/2023)  Stress: No Stress Concern Present (11/06/2022)  Tobacco Use: Medium Risk (09/04/2023)    Readmission Risk Interventions    09/16/2023   12:37 PM 06/24/2023    2:33 PM 11/21/2022   10:10 AM  Readmission Risk Prevention Plan  Transportation Screening Complete Complete Complete  PCP or Specialist Appt within 3-5 Days  Complete   HRI or  Home Care Consult  Complete Complete  Social Work Consult for Recovery Care Planning/Counseling  Complete Complete  Palliative Care Screening  Not Applicable Not Applicable  Medication Review Oceanographer)  Complete Complete  HRI or Home Care Consult Complete    SW Recovery Care/Counseling Consult Complete    Palliative Care Screening Complete    Skilled Nursing Facility Patient Refused

## 2023-09-16 NOTE — Progress Notes (Signed)
 [facility] PROGRESS NOTE   [name] VWU:[id number] DOB: Jan 03, 1956 DOA: 09/03/2023  PCP: Patient, [name]  Brief History: 68 yr female with a significant past medical history for cervical cancer 2003 s/p hysterectomy, hx of TPN use/malnutrition, right hydronephrosis s/p uretal stent 03/2023 and R nephrostomy tube placed in 04/2023 due to hospitalization at [facility] for obstructive uropathy AKI and BL hydronephrosis complicated by Enterobacter and Candida UTI, rectovaginal s/p anterior resection with colovaginal fistula repair, loop ileostomy with closure in 2021, SBO s/p with lysis of adhesions, recurrent SBO, ileum/cecum resection with ileostomy creation 2024, and recent [facility] hospitalization (2/14-2/23/25) which revealed patient had severe dehydration in the setting of high output and poor oral intake who presents to [facility] ED from [facility] due to abnormal lab work indicating acute renal failure (Cr >10, BUN >169, Anion Gap acidosis, severe hyperkalemia-k>6, and hyponatremia Na 117. Due to severity of acute kidney failure and acidosis, PCCM was consulted for ICU admission and further management along with nephrology consult.   Consultants: Critical care medicine.  Nephrology  Procedures: CRRT while in ICU.  Hemodialysis catheter placement.    Subjective/Interval History: Patient feels well this morning.  Denies any nausea.  Eating her breakfast.  No vomiting since yesterday morning.   Assessment/Plan:  Acute kidney injury secondary to ATN in the setting of shock/acute metabolic acidosis/electrolyte abnormalities with hypokalemia hypomagnesemia and hyponatremia Presented with creatinine of 10 and a potassium of 6.6. Was seen by nephrology.  HD catheter was placed and patient underwent CRRT. Renal function was normal back in January but has been greater than 2 since February.  Creatinine had improved but noted to have worsened yesterday possibly due to poor  oral intake and persistent high ileostomy output.  Nausea and vomiting likely also contributed.   Patient was given IV fluids with improvement in renal function noted.  Will continue IV fluids for another 12 hours.  Hopefully by then her oral intake would have improved enough for her to hydrate by mouth. Her ostomy output had improved possibly due to octreotide but it caused significant nausea and vomiting and so had to be held.  See below. Nephrology has signed off.  Dialysis catheter has been removed. Sodium level has improved.  Noted to be on salt tablets.   Potassium level has improved as well.  Magnesium 2.2 on 3/30  High output ostomy This is a main reason why she became hypovolemic and went into renal failure. Patient was given FiberCon, cholestyramine, Lomotil and Imodium.  No significant improvement was noted so she was started on octreotide with which she did have improvement in her output however became significantly nauseated and had multiple episodes of vomiting so octreotide had to be discontinued. X-ray done yesterday did not show any evidence for ileus or obstruction. Output appears to have resumed this morning.  Will continue with colestyramine, Imodium and FiberCon.  Will resume Lomotil from tonight.    Nausea and vomiting Possibly due to octreotide.  Abdominal film without any acute findings.  Symptoms appear to have improved.  Continue to monitor.     Septic shock due to urinary tract infection/chronic hypotension Noted to be on Zosyn.  No positive cultures noted.  However she does have a complicated urinary tract with presence of gastrostomy tube. Will need Zosyn for at least 7 to 10 days.  Looks like Zosyn was initiated on 3/22.  Prior to that she was on ceftriaxone.  Will complete course of antibiotics on 4/1.  She is noted to be on midodrine.  Has low blood pressures but she is asymptomatic for the most part.    History of small bowel obstruction with recurrent SBO's Has  had multiple surgeries previously mostly done at [facility].  Currently has ileostomy. Surgeon at [facility] has said that there was no role for reversing her ileostomy.   Surgery here was consulted since husband wanted another opinion.  They also do not plan any surgical interventions at this time.  Ileostomy reversal will be high risk. This can be further pursued in the outpatient setting.  [facility].  This was changed by IR on 3/24.  Outpatient follow-up with her providers at [facility].  Severe malnutrition/use of TPN during previous hospitalizations at [facility] Against the use of NG tube and has been refusing TNF as well. Oral intake had improved but then worsened due to nausea vomiting.  Hopefully it will improve going forward. Continue to encourage.  Acute metabolic encephalopathy This is in the setting of electrolyte abnormalities and renal failure.  Seems to have improved.  Could be close to baseline.  Hypoglycemia She was noted to have low glucose levels.  Was noted to be on insulin though she denied history of diabetes.  HbA1c was 5.7.  Insulin products were discontinued.   Hypoglycemia appears to have resolved.    Normocytic anemia and mild thrombocytopenia Low hemoglobin noted but no evidence of overt bleeding.  Has been stable for several days.  Check periodically. Platelet counts have improved.Marland Kitchen  History of neuropathic pain Noted to be on gabapentin.  Hypothyroidism Continue levothyroxine.  Goals of care Palliative care has been following.   Spoke at length to patient and her husband at bedside on 3/30.  He seems quite frustrated with lack of progress.  He does not think that rehab will be beneficial since she has tried a month of rehab a few months ago without much improvement.  But he wants to discuss this further with his daughter and the palliative care.  After discussions regarding CODE STATUS patient to be changed over  to DNR.  She does not want resuscitation or be put on life support.   Hospice is being considered.  DVT Prophylaxis: Subcutaneous heparin Code Status: DNR Family Communication: No family at bedside this morning. Disposition Plan: Family leaning towards taking her home.    Medications: Scheduled:  cholestyramine light  4 g Oral Q12H   vitamin B-12  1,000 mcg Oral Daily   feeding supplement  237 mL Oral TID BM   folic acid  1 mg Oral Daily   gabapentin  100 mg Oral Q8H   heparin  5,000 Units Subcutaneous Q8H   levothyroxine  25 mcg Oral Q0600   lipase/protease/amylase  24,000 Units Oral TID WC   loperamide  4 mg Oral Q6H   midodrine  15 mg Oral TID WC   multivitamin with minerals  1 tablet Oral Daily   polycarbophil  625 mg Oral BID   sodium chloride flush  3 mL Intravenous Q12H   sodium chloride  1 g Oral BID WC   thiamine  100 mg Oral Daily   Vitamin D (Ergocalciferol)  50,000 Units Oral Q7 days   Continuous:  piperacillin-tazobactam (ZOSYN)  IV 2.25 g (09/16/23 0531)   promethazine (PHENERGAN) injection (IM or IVPB) Stopped (09/14/23 1058)   ION:[id number], ondansetron (ZOFRAN) IV, promethazine (PHENERGAN) injection (IM or IVPB)  Antibiotics: Anti-infectives (From admission, onward)    Start  Dose/Rate Route Frequency Ordered Stop   09/13/23 1600  fluconazole (DIFLUCAN) tablet 150 mg        150 mg Oral  Once 09/13/23 1507 09/13/23 1655   09/06/23 1400  piperacillin-tazobactam (ZOSYN) IVPB 2.25 g        2.25 g 100 mL/hr over 30 Minutes Intravenous Every 8 hours 09/06/23 0940 09/16/23 2359   09/05/23 0100  vancomycin (VANCOREADY) IVPB 500 mg/100 mL  Status:  Discontinued        500 mg 100 mL/hr over 60 Minutes Intravenous Every 24 hours 09/04/23 0128 09/06/23 0920   09/04/23 0000  piperacillin-tazobactam (ZOSYN) IVPB 3.375 g  Status:  Discontinued        3.375 g 100 mL/hr over 30 Minutes Intravenous Every 6 hours 09/03/23 2307 09/06/23 0940   09/03/23 2330   vancomycin (VANCOCIN) IVPB 1000 mg/200 mL premix        1,000 mg 200 mL/hr over 60 Minutes Intravenous  Once 09/03/23 2307 09/04/23 0215   09/03/23 2200  cefTRIAXone (ROCEPHIN) 1 g in sodium chloride 0.9 % 100 mL IVPB  Status:  Discontinued        1 g 200 mL/hr over 30 Minutes Intravenous Every 24 hours 09/03/23 2121 09/03/23 2249   09/03/23 2200  fluconazole (DIFLUCAN) IVPB 400 mg  Status:  Discontinued        400 mg 100 mL/hr over 120 Minutes Intravenous Every 24 hours 09/03/23 2121 09/04/23 1627       Objective:  Vital Signs  Vitals:   09/16/23 0301 09/16/23 0500 09/16/23 0745 09/16/23 0900  BP: 95/60  90/63 (!) 100/56  Pulse: 75  72 73  Resp: 18  18 18   Temp: 98.2 F (36.8 C)  98.2 F (36.8 C) 97.8 F (36.6 C)  TempSrc: Oral  Oral Oral  SpO2:      Weight:  42 kg    Height:        Intake/Output Summary (Last 24 hours) at 09/16/2023 1107 Last data filed at 09/16/2023 1020 Gross per 24 hour  Intake 853.8 ml  Output 2500 ml  Net -1646.2 ml   Filed Weights   09/14/23 0507 09/15/23 0425 09/16/23 0500  Weight: 43.4 kg 44 kg 42 kg    General appearance: Awake alert.  In no distress Resp: Clear to auscultation bilaterally.  Normal effort Cardio: S1-S2 is normal regular.  No S3-S4.  No rubs murmurs or bruit GI: Abdomen is soft.  Ostomy is noted.  Right nephrostomy is noted.   Lab Results:  Data Reviewed: I have personally reviewed following labs and reports of the imaging studies  CBC: Recent Labs  Lab 09/10/23 0404 09/12/23 0524 09/14/23 0509  WBC 7.1 3.9* 5.0  HGB 7.9* 7.4* 8.8*  HCT 22.2* 21.9* 25.6*  MCV 93.3 96.9 95.5  PLT 133* 146* 210    Basic Metabolic Panel: Recent Labs  Lab 09/10/23 0404 09/10/23 0956 09/11/23 0425 09/11/23 1610 09/12/23 0524 09/13/23 0239 09/13/23 0600 09/13/23 1657 09/14/23 0509 09/15/23 0550 09/16/23 0554  NA 134*  --  135  --  133* 135  --   --  133* 137 137  K 2.6*   < > 3.1*   < > 3.0* 2.7*  --  3.8 4.0 3.9 3.6   CL 93*  --  92*  --  92* 89*  --   --  88* 99 100  CO2 29  --  34*  --  31 34*  --   --  31  23 27  GLUCOSE 71  --  74  --  139* 80  --   --  112* 98 117*  BUN 34*  --  31*  --  29* 30*  --   --  38* 33* 29*  CREATININE 2.71*  --  2.42*  --  2.41* 2.39*  --   --  2.92* 2.64* 2.53*  CALCIUM 8.6*  --  8.5*  --  8.5* 9.0  --   --  9.4 9.2 9.0  MG 2.0  --  1.4*  --  2.1  --  1.7  --  2.2  --   --   PHOS 2.9  --  2.8  --  2.9 2.9  --   --   --   --   --    < > = values in this interval not displayed.    GFR: Estimated Creatinine Clearance: 14.3 mL/min (A) (by C-G formula based on SCr of 2.53 mg/dL (H)).  Liver Function Tests: Recent Labs  Lab 09/10/23 0404 09/11/23 0425 09/12/23 0524 09/13/23 0239  ALBUMIN 2.0* 1.9* 1.9* 2.2*    CBG: Recent Labs  Lab 09/15/23 0715 09/15/23 1109 09/15/23 1604 09/15/23 2153 09/16/23 0744  GLUCAP 105* 147* 137* 220* 120*    Radiology Studies: DG Abd Portable 1V Result Date: 09/15/2023 CLINICAL DATA:  Nausea and vomiting EXAM: PORTABLE ABDOMEN - 1 VIEW COMPARISON:  CT scan 09/03/2023 FINDINGS: Right nephrostomy tube observed, coil in the expected location. Distended appearing stomach, otherwise unremarkable bowel gas pattern. Irregular soft tissue calcifications in the lower anatomic pelvis as on prior CT scan. No dilated small or large bowel identified. IMPRESSION: 1. Distended appearing stomach, otherwise unremarkable bowel gas pattern. 2. Right nephrostomy tube coil in the expected location. 3. Irregular soft tissue calcifications in the lower anatomic pelvis as on prior CT scan. Electronically Signed   By: Gaylyn Rong M.D.   On: 09/15/2023 18:54       LOS: 13 days   Damain Broadus [employer] Pager on [email or web address]  09/16/2023, 11:07 AM

## 2023-09-16 NOTE — Progress Notes (Signed)
 Pt refusing cardiac monitoring. Janalyn Shy, MD updated

## 2023-09-16 NOTE — Plan of Care (Signed)
   Problem: Nutrition: Goal: Adequate nutrition will be maintained Outcome: Progressing   Problem: Coping: Goal: Level of anxiety will decrease Outcome: Progressing   Problem: Pain Managment: Goal: General experience of comfort will improve and/or be controlled Outcome: Progressing

## 2023-09-16 NOTE — Progress Notes (Addendum)
 Daily Progress Note   Patient Name: [name]       Date: 09/16/2023 DOB: 11/20/1955  Age: 68 y.o. MRN#: [id number] Attending Physician: Osvaldo Shipper, MD Primary Care Physician: Patient, [name] Admit Date: 09/03/2023 Length of Stay: 13 days  Reason for Consultation/Follow-up: Establishing goals of care  HPI/Patient Profile:  68 y.o. 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  She presented from outpatient nephrology with critical labs including creatinine of 10.15.  She was admitted on 09/03/2023 with AKI due to ischemic ATN from septic shock, multiple electrolyte abnormalities, septic shock due to urinary tract infection, acute metabolic encephalopathy due to sepsis and uremia, history of recurrent SBO, severe malnutrition, high output ostomy, and others.    Palliative medicine was consulted for GOC conversations.  Subjective:   Subjective: Chart Reviewed. Updates received. Patient Assessed. Created space and opportunity for patient  and family to explore thoughts and feelings regarding current medical situation.  Today's Discussion: Today I saw the patient at bedside, her husband present.  We spent a good amount of time discussing her current state.  Today she is feeling much better, is enjoying a cup of coffee but her husband brought her from [employer] downstairs.  We spent some time in pleasant conversation trying to lift her mood.  We discussed plan to "try to get her home soon, encouraged her to eat and drink to support her needs, have services come in  periodically to help her husband [name] take care of her, ensure she is comfortable, and help her enjoy her life is much as possible."  We did not name the services as "hospice" because the patient has very poor insight into her situation and frequently forgets.  For example, we talked about this providers back surgery last year related to the patient noting occasional "twinges" of pain from past surgeries.  I shared that I understand because of my personal experience.  5 minutes later we are talking about understanding how we will need help now and then and I related back to my previous surgery and she stated "you had surgery?" apparently forgetting our conversation from 5 minutes prior.  Also, she stated a couple times "I know I am not a spring chicken but I am not a 12 or 68 year old woman."  Because of these questions about her true understanding, we have been discussing options with the patient's family and considering hospice services without anemia "hospice" to avoid upsetting the patient.  I got permission from husband [name] to call about "services" for them to reach out to him by phone and explain what they can offer.  I assured him that there is no commitment to accept, but recommend considering the help.  Finally we spent more time on pleasant conversation around things that the patient enjoys.  We talked about coffee, favorite types of coffee, pets, etc.  I encouraged family or friends to have pets to visit her at home with the pets to bring her joy.  I shared that I would be back in a couple days to follow-up.  I provided [name] with another copy of our contact card and information.  I encouraged him to call for any questions or concerns. I provided emotional and general support through therapeutic listening, empathy, sharing of stories, and other techniques. I answered all questions and addressed all concerns to the best of my ability.  After the meeting and visit I debriefed with the  medical team, nursing team, and TOC.  Review of Systems  Respiratory:  Negative for shortness of breath.   Cardiovascular:  Negative for chest pain.  Gastrointestinal:  Negative for abdominal pain, nausea and vomiting.    Objective:   Vital Signs:  BP (!) 100/56 (BP Location: Right Arm)   Pulse 73   Temp 97.8 F (36.6 C) (Oral)   Resp 18   Ht 5\' 2"  (1.575 m)   Wt 42 kg   SpO2 96%   BMI 16.94 kg/m   Physical Exam Vitals and nursing note reviewed.  Constitutional:      General: She is not in acute distress.    Appearance: She is ill-appearing.  HENT:     Head: Normocephalic and atraumatic.  Cardiovascular:     Rate and Rhythm: Normal rate.  Pulmonary:     Effort: No respiratory distress.     Breath sounds: No wheezing or rhonchi.  Abdominal:     General: Abdomen is flat. There is no distension.     Palpations: Abdomen is soft.  Neurological:     General: No focal deficit present.     Mental Status: She is alert.  Psychiatric:        Mood and Affect: Mood normal.        Behavior: Behavior normal.     Palliative Assessment/Data: 40-50%    Existing Vynca/ACP Documentation: None  Assessment & Plan:   Impression: Present on Admission:  Acute kidney injury (HCC)  68 year old female with acute presentation of chronic comorbidities as described above. The patient has an extensive surgical history as noted above. She has a high output ostomy which they were hoping to get reversed, but surgery here has recommended she return to [facility]. At last visit Dr. [name] at [facility] counseled about high risk and patient was quite hesitant, but Dr. [name] offered to revisit in the future if desired. Despite all medical attempts to slow down ostomy output and provide for a good quality of life we have been unsuccessful.  At this point our options are TPN and fluids at home as "artificial support" to prolong her life versus comfort care.  After substantial discussion family has  elected comfort care with an explanation to the patient in a more paternal sense (as noted above) due to her lack of capacity to participate in decision-making. Hospice has now been consulted. Overall prognosis poor   SUMMARY OF RECOMMENDATIONS   DNR Continue current scope of care Request TOC to consult [facility] to CALL HUSBAND ONLY to discuss services and what they can offer Palliative medicine will follow-up Thursday  Symptom Management:  Per primary team PMT is available to assist as needed  Code Status: Full code  Prognosis: Unable to determine  Discharge Planning: To Be Determined  Discussed with: Patient, family, medical team, nursing team, [facility] team  Thank you for allowing [place] to participate in the care of [name] PMT will continue to support holistically.  Time Total: 92 min  Detailed review of medical records (labs, imaging, vital signs), medically appropriate exam, discussed with treatment team, counseling and education to patient, family, & staff, documenting clinical information, medication management, coordination of care  Wynne Dust, NP Palliative Medicine Team  Team Phone # [phone] (Nights/Weekends)  02/13/2021, 8:17 AM

## 2023-09-17 DIAGNOSIS — E875 Hyperkalemia: Secondary | ICD-10-CM | POA: Diagnosis not present

## 2023-09-17 DIAGNOSIS — E43 Unspecified severe protein-calorie malnutrition: Secondary | ICD-10-CM | POA: Diagnosis not present

## 2023-09-17 DIAGNOSIS — A419 Sepsis, unspecified organism: Secondary | ICD-10-CM | POA: Diagnosis not present

## 2023-09-17 DIAGNOSIS — N179 Acute kidney failure, unspecified: Secondary | ICD-10-CM | POA: Diagnosis not present

## 2023-09-17 LAB — CBC
HCT: 25.9 % — ABNORMAL LOW (ref 36.0–46.0)
Hemoglobin: 9 g/dL — ABNORMAL LOW (ref 12.0–15.0)
MCH: 34.2 pg — ABNORMAL HIGH (ref 26.0–34.0)
MCHC: 34.7 g/dL (ref 30.0–36.0)
MCV: 98.5 fL (ref 80.0–100.0)
Platelets: 215 10*3/uL (ref 150–400)
RBC: 2.63 MIL/uL — ABNORMAL LOW (ref 3.87–5.11)
RDW: 17.2 % — ABNORMAL HIGH (ref 11.5–15.5)
WBC: 4.3 10*3/uL (ref 4.0–10.5)
nRBC: 0 % (ref 0.0–0.2)

## 2023-09-17 LAB — BASIC METABOLIC PANEL WITH GFR
Anion gap: 12 (ref 5–15)
BUN: 36 mg/dL — ABNORMAL HIGH (ref 8–23)
CO2: 30 mmol/L (ref 22–32)
Calcium: 9.3 mg/dL (ref 8.9–10.3)
Chloride: 92 mmol/L — ABNORMAL LOW (ref 98–111)
Creatinine, Ser: 2.61 mg/dL — ABNORMAL HIGH (ref 0.44–1.00)
GFR, Estimated: 20 mL/min — ABNORMAL LOW (ref >60)
Glucose, Bld: 119 mg/dL — ABNORMAL HIGH (ref 70–99)
Potassium: 3.2 mmol/L — ABNORMAL LOW (ref 3.5–5.1)
Sodium: 134 mmol/L — ABNORMAL LOW (ref 135–145)

## 2023-09-17 LAB — GLUCOSE, CAPILLARY
Glucose-Capillary: 125 mg/dL — ABNORMAL HIGH (ref 70–99)
Glucose-Capillary: 291 mg/dL — ABNORMAL HIGH (ref 70–99)
Glucose-Capillary: 217 mg/dL — ABNORMAL HIGH (ref 70–99)
Glucose-Capillary: 187 mg/dL — ABNORMAL HIGH (ref 70–99)

## 2023-09-17 LAB — PHOSPHORUS: Phosphorus: 2.5 mg/dL (ref 2.5–4.6)

## 2023-09-17 LAB — MAGNESIUM: Magnesium: 1.6 mg/dL — ABNORMAL LOW (ref 1.7–2.4)

## 2023-09-17 MED ORDER — DIPHENOXYLATE-ATROPINE 2.5-0.025 MG PO TABS
1.0000 | ORAL_TABLET | Freq: Four times a day (QID) | ORAL | Status: DC
  Administered 2023-09-17 – 2023-09-19 (×7): 1 via ORAL
  Filled 2023-09-17 (×7): qty 1

## 2023-09-17 MED ORDER — SODIUM CHLORIDE 0.9 % IV SOLN
INTRAVENOUS | Status: DC
  Administered 2023-09-17: 75 mL via INTRAVENOUS

## 2023-09-17 MED ORDER — POTASSIUM CHLORIDE CRYS ER 20 MEQ PO TBCR
40.0000 meq | EXTENDED_RELEASE_TABLET | Freq: Two times a day (BID) | ORAL | Status: AC
  Administered 2023-09-17 (×2): 40 meq via ORAL
  Filled 2023-09-17 (×2): qty 2

## 2023-09-17 MED ORDER — MAGNESIUM SULFATE 2 GM/50ML IV SOLN
2.0000 g | Freq: Once | INTRAVENOUS | Status: AC
  Administered 2023-09-17: 2 g via INTRAVENOUS
  Filled 2023-09-17: qty 50

## 2023-09-17 NOTE — Progress Notes (Incomplete)
 PROGRESS NOTE    [name]  ZOX:[id number] DOB: [birth year] DOA: 09/03/2023 PCP: Patient, [name]   Brief Narrative:  The patient is a 68 yr female with a significant past medical history for cervical cancer 2003 s/p hysterectomy, hx of TPN use/malnutrition, right hydronephrosis s/p uretal stent 03/2023 and R nephrostomy tube placed in 04/2023 due to hospitalization at [facility] for obstructive uropathy AKI and BL hydronephrosis complicated by Enterobacter and Candida UTI, rectovaginal s/p anterior resection with colovaginal fistula repair, loop ileostomy with closure in 2021, SBO s/p with lysis of adhesions, recurrent SBO, ileum/cecum resection with ileostomy creation 2024, and recent [facility] hospitalization (2/14-2/23/25) which revealed patient had severe dehydration in the setting of high output and poor oral intake who presents to [facility] ED from [facility] due to abnormal lab work indicating acute renal failure (Cr >10, BUN >169, Anion Gap acidosis, severe hyperkalemia-k>6, and hyponatremia Na 117. Due to severity of acute kidney failure and acidosis, PCCM was consulted for ICU admission and further management along with nephrology consult.  Underwent HD catheter placement and CRRT short-term and now creatinine is improved significantly.  She continues to have significant high output ileostomy and has had over 4 L in the last 24 hours so general surgery has been asked to reevaluate.  After further goals of care discussion the plan is to take the patient home with home hospice so that she could go to the beach and plan is to discharge her home on 09/19/2023.   Assessment and Plan:  Acute kidney injury secondary to ATN in the setting of shock/acute metabolic acidosis/electrolyte abnormalities with hypokalemia hypomagnesemia and hyponatremia -Presented with creatinine of 10 and a potassium of 6.6. Was seen by nephrology.  HD catheter was placed and patient underwent CRRT. -Renal  function was normal back in January but has been greater than 2 since February.  --BUN/Cr Trend: Recent Labs  Lab 09/12/23 0524 09/13/23 0239 09/14/23 0509 09/15/23 0550 09/16/23 0554 09/17/23 0456 09/18/23 0548  BUN 29* 30* 38* 33* 29* 36* 41*  CREATININE 2.41* 2.39* 2.92* 2.64* 2.53* 2.61* 2.52*  -Creatinine had  initially improved but now worsening possibly due to poor oral intake and persistent high ileostomy output. N/V also contributed. -Resume IVF with NS at 75 mL/hr that she continues have poor p.o. intake and nausea vomiting. -Avoid Nephrotoxic Medications, Contrast Dyes, Hypotension and Dehydration to Ensure Adequate Renal Perfusion and will need to Renally Adjust Meds -Continue to Monitor and Trend Renal Function carefully and repeat CMP in the AM  -Nephrology has signed off.  Dialysis catheter has been removed. Sodium level has improved.  Noted to be on salt tablets 1 g p.o. twice daily   High output ostomy: This is a main reason why she became hypovolemic and went into renal failure. Patient was given FiberCon, cholestyramine, Lomotil and Imodium.  No significant improvement was noted so she was started on octreotide with which she did have improvement in her output however became significantly nauseated and had multiple episodes of vomiting so octreotide had to be discontinued. X-ray done the day beforeyesterday did not show any evidence for ileus or obstruction. Output appears to still be very high. C/w Cholestyramine Light 4 grams po BID, Lomotil 1 tab po 4x Daily, Creon 36,000 TID, Polycarbophil 625 mg po BID, and Loperamide 4 mg po q6h. Discussed with General Surgery and they will re-consult in the AM   HypoK+: K+ is now 3.7. Replete with po Kcl 40 mEQ BID x2  yesterday. CTM and Replete as Necessary. Repeat CMP in the AM  HypoMag: Mg is now 2.0. Replete with IV Mag Sulfate 2 grams. CTM and Replete as Necessary in the AM  HypoNa+: Na+ is now 135. Resume IVF with NS @ 75  mL/hr. Will give a 500 mL bolus. CTM and Trend and repeat CMP in the AM  Hypophosphatemia: Phos Level is now 2.2. Replete with po K Phos Neutral 500 mg po BID. CTM and trend and repeat CMP in the AM    Nausea and Vomiting: Possibly due to octreotide.  Abdominal film without any acute findings. C/w IV Zofran 4 mg q6hprn Nausea and IV Promethazine 12.5 mg q6hprn Refractory Nausea/Vomiting.  Symptoms appear to have improved.  CTM as it seems to be improving    Septic shock due to urinary tract infection/chronic hypotension Noted to be on Zosyn which has now completed.  No positive cultures noted.  However she does have a complicated urinary tract with presence of gastrostomy tube.   Looks like Zosyn was initiated on 3/22.  Prior to that she was on ceftriaxone. Now completed course of antibiotics on 4/1. She is noted to be on Midodrine 15 mg po TID and will be continued at D/C. Has low blood pressures but she is asymptomatic for the most part.     History of SBO with recurrent SBO's: Has had multiple surgeries previously mostly done at [facility].  Currently has ileostomy. Surgeon at [facility] has said that there was no role for reversing her ileostomy.  Surgery here was consulted since husband wanted another opinion.  They also do not plan any surgical interventions at this time.  Ileostomy reversal will be high risk. This can be further pursued in the outpatient setting.   Right [facility]: [facility].  This was changed by IR on 3/24.  Outpatient follow-up with her providers at [facility].    Acute Metabolic Encephalopathy: This is in the setting of electrolyte abnormalities and renal failure.  Seems to have improved.  Could be close to baseline. CTM and place on Delirium Precautions as mentation waxes and wanes   Hypoglycemia: She was noted to have low glucose levels.  Was noted to be on insulin though she denied history of diabetes.  HbA1c was 5.7. Insulin products were  discontinued.  Hypoglycemia appears to have resolved.     Normocytic Anemia and Mild Thrombocytopenia -Low hemoglobin noted but no evidence of overt bleeding.  Has been stable for several days. Hgb/Hct and Plt Count Trend: Recent Labs  Lab 09/06/23 0356 09/09/23 0436 09/10/23 0404 09/12/23 0524 09/14/23 0509 09/17/23 0456 09/18/23 0548  HGB 8.0* 6.7* 7.9* 7.4* 8.8* 9.0* 8.4*  HCT 21.9* 19.0* 22.2* 21.9* 25.6* 25.9* 23.5*  MCV 93.6 96.0 93.3 96.9 95.5 98.5 97.5  PLT 113* 159 133* 146* 210 215 197  -Check Anemia Panel in the outpatient setting. CTM for S/Sx of Bleeding; No overt bleeding noted. Repeat CBC in the AM    History of Neuropathic Pain: C/w Gabapentin 100 mg po q8h   Hypothyroidism: C/w Levothyroxine 25 mcg po Daily.   Goals of care: Palliative care has been following. Spoke at length to patient and her husband at bedside on 3/30.  He seems quite frustrated with lack of progress.  He does not think that rehab will be beneficial since she has tried a month of rehab a few months ago without much improvement.  But he wants to discuss this further with his daughter  and the palliative care.  After discussions regarding CODE STATUS patient to be changed over to DNR.  She does not want resuscitation or be put on life support.   Hospice is being pursed now after further GOC discussion and will D/C home with Home Hospice 09/19/23  Severe Malnutrition in the Context of Chronic illness/ Underweight / Hx of Use of TPN during previous Hospitalizations at [facility]: Nutrition Status: Nutrition Problem: Severe Malnutrition Etiology: chronic illness (recurrent GI surgeries and hospitalizations (5 in the last 6 months), recurrent TPN use) Signs/Symptoms: severe fat depletion, severe muscle depletion, percent weight loss Percent weight loss: 24 % (3 months) Interventions: Ensure Enlive (each supplement provides 350kcal and 20 grams of protein), MVI, Magic cup -Against the use of NG tube and has  been refusing TNF as well. -Oral intake had improved but then worsened due to nausea vomiting.  Hopefully it will improve going forward. Continue to encourage. -After further goals of care discussion palliative met with the patient and the husband and plan is to pursue home with Home Hospice   DVT prophylaxis: heparin injection 5,000 Units Start: 09/03/23 2200 SCDs Start: 09/03/23 1948    Code Status: Limited: Do not attempt resuscitation (DNR) -DNR-LIMITED -Do Not Intubate/DNI  Family Communication: Discussed with husband at bedside extensively   Disposition Plan:  Level of care: Med-Surg Status is: Inpatient Remains inpatient appropriate because: Dissipating discharging home with home hospice in the next 24 hours   Consultants:  General Surgery Gastroenterology Palliative Care Medicine Nephrology PCCM Transfer  Procedures:  As delineated as above  Antimicrobials:  Anti-infectives (From admission, onward)    Start     Dose/Rate Route Frequency Ordered Stop   09/13/23 1600  fluconazole (DIFLUCAN) tablet 150 mg        150 mg Oral  Once 09/13/23 1507 09/13/23 1655   09/06/23 1400  piperacillin-tazobactam (ZOSYN) IVPB 2.25 g        2.25 g 100 mL/hr over 30 Minutes Intravenous Every 8 hours 09/06/23 0940 09/16/23 2222   09/05/23 0100  vancomycin (VANCOREADY) IVPB 500 mg/100 mL  Status:  Discontinued        500 mg 100 mL/hr over 60 Minutes Intravenous Every 24 hours 09/04/23 0128 09/06/23 0920   09/04/23 0000  piperacillin-tazobactam (ZOSYN) IVPB 3.375 g  Status:  Discontinued        3.375 g 100 mL/hr over 30 Minutes Intravenous Every 6 hours 09/03/23 2307 09/06/23 0940   09/03/23 2330  vancomycin (VANCOCIN) IVPB 1000 mg/200 mL premix        1,000 mg 200 mL/hr over 60 Minutes Intravenous  Once 09/03/23 2307 09/04/23 0215   09/03/23 2200  cefTRIAXone (ROCEPHIN) 1 g in sodium chloride 0.9 % 100 mL IVPB  Status:  Discontinued        1 g 200 mL/hr over 30 Minutes Intravenous Every  24 hours 09/03/23 2121 09/03/23 2249   09/03/23 2200  fluconazole (DIFLUCAN) IVPB 400 mg  Status:  Discontinued        400 mg 100 mL/hr over 120 Minutes Intravenous Every 24 hours 09/03/23 2121 09/04/23 1627       Subjective: Seen and examined at bedside and she is sitting up in the bed.  Continues to have high output ileostomy.  No dizziness or lightheadedness.  Renal function appears improved.  Not as nauseous today.  No other concerns or complaints at this time.  I have met with the patient's family and plan is to take her home with  home hospice likely in the next 24 hours as the goal is for the patient to go to [place] and watch the sunset.  Objective: Vitals:   09/18/23 0442 09/18/23 0800 09/18/23 1200 09/18/23 1418  BP: (!) 93/52 (!) 93/59 116/66 (!) 150/70  Pulse: 67 84 62 82  Resp: 15 16 15 16   Temp: 98.1 F (36.7 C) 98.3 F (36.8 C) 97.9 F (36.6 C) 97.8 F (36.6 C)  TempSrc: Oral Oral  Oral  SpO2: 99% 100%  100%  Weight: 41.8 kg     Height:        Intake/Output Summary (Last 24 hours) at 09/18/2023 1419 Last data filed at 09/18/2023 1408 Gross per 24 hour  Intake 1510 ml  Output 4870 ml  Net -3360 ml   Filed Weights   09/16/23 0500 09/17/23 0349 09/18/23 0442  Weight: 42 kg 41.5 kg 41.8 kg   Examination: Physical Exam:  Constitutional: Thin chronically ill-appearing Caucasian female in NAD Respiratory: Diminished to auscultation bilaterally, no wheezing, rales, rhonchi or crackles. Normal respiratory effort and patient is not tachypenic. No accessory muscle use. Unlabored breathing  Cardiovascular: RRR, no murmurs / rubs / gallops. S1 and S2 auscultated. No extremity edema. Abdomen: Soft, mildly-tender, non-distended. Ostomy has high output. Bowel sounds positive.  GU: Deferred. Musculoskeletal: No clubbing / cyanosis of digits/nails. No joint deformity upper and lower extremities.  Skin: No rashes, lesions, ulcers on a limited skin. No induration; Warm  and dry.  Neurologic: CN 2-12 grossly intact with no focal deficits. Romberg sign and cerebellar reflexes not assessed.  Psychiatric:  Awake and alert but has some inappropriate responses to questioning.    Data Reviewed: I have personally reviewed following labs and imaging studies  CBC: Recent Labs  Lab 09/12/23 0524 09/14/23 0509 09/17/23 0456 09/18/23 0548  WBC 3.9* 5.0 4.3 3.1*  NEUTROABS  --   --   --  1.7  HGB 7.4* 8.8* 9.0* 8.4*  HCT 21.9* 25.6* 25.9* 23.5*  MCV 96.9 95.5 98.5 97.5  PLT 146* 210 215 197   Basic Metabolic Panel: Recent Labs  Lab 09/12/23 0524 09/13/23 0239 09/13/23 0600 09/13/23 1657 09/14/23 0509 09/15/23 0550 09/16/23 0554 09/17/23 0456 09/18/23 0548  NA 133* 135  --   --  133* 137 137 134* 135  K 3.0* 2.7*  --    < > 4.0 3.9 3.6 3.2* 3.7  CL 92* 89*  --   --  88* 99 100 92* 94*  CO2 31 34*  --   --  31 23 27 30 28   GLUCOSE 139* 80  --   --  112* 98 117* 119* 103*  BUN 29* 30*  --   --  38* 33* 29* 36* 41*  CREATININE 2.41* 2.39*  --   --  2.92* 2.64* 2.53* 2.61* 2.52*  CALCIUM 8.5* 9.0  --   --  9.4 9.2 9.0 9.3 9.1  MG 2.1  --  1.7  --  2.2  --   --  1.6* 2.0  PHOS 2.9 2.9  --   --   --   --   --  2.5 2.2*   < > = values in this interval not displayed.   GFR: Estimated Creatinine Clearance: 14.3 mL/min (A) (by C-G formula based on SCr of 2.52 mg/dL (H)). Liver Function Tests: Recent Labs  Lab 09/12/23 0524 09/13/23 0239 09/18/23 0548  AST  --   --  21  ALT  --   --  25  ALKPHOS  --   --  146*  BILITOT  --   --  0.5  PROT  --   --  6.1*  ALBUMIN 1.9* 2.2* 2.6*   No results for input(s): "LIPASE", "AMYLASE" in the last 168 hours. No results for input(s): "AMMONIA" in the last 168 hours. Coagulation Profile: No results for input(s): "INR", "PROTIME" in the last 168 hours. Cardiac Enzymes: No results for input(s): "CKTOTAL", "CKMB", "CKMBINDEX", "TROPONINI" in the last 168 hours. BNP (last 3 results) No results for input(s):  "PROBNP" in the last 8760 hours. HbA1C: No results for input(s): "HGBA1C" in the last 72 hours. CBG: Recent Labs  Lab 09/17/23 1214 09/17/23 1628 09/17/23 2121 09/18/23 0745 09/18/23 1141  GLUCAP 291* 217* 187* 114* 244*   Lipid Profile: No results for input(s): "CHOL", "HDL", "LDLCALC", "TRIG", "CHOLHDL", "LDLDIRECT" in the last 72 hours. Thyroid Function Tests: No results for input(s): "TSH", "T4TOTAL", "FREET4", "T3FREE", "THYROIDAB" in the last 72 hours. Anemia Panel: No results for input(s): "VITAMINB12", "FOLATE", "FERRITIN", "TIBC", "IRON", "RETICCTPCT" in the last 72 hours. Sepsis Labs: No results for input(s): "PROCALCITON", "LATICACIDVEN" in the last 168 hours.  No results found for this or any previous visit (from the past 240 hours).   Radiology Studies: No results found.  Scheduled Meds:  ascorbic acid  250 mg Oral BID   cholestyramine light  4 g Oral Q12H   vitamin B-12  1,000 mcg Oral Daily   diphenoxylate-atropine  1 tablet Oral QID   feeding supplement  237 mL Oral TID BM   folic acid  1 mg Oral Daily   gabapentin  100 mg Oral Q8H   heparin  5,000 Units Subcutaneous Q8H   levothyroxine  25 mcg Oral Q0600   lipase/protease/amylase  36,000 Units Oral TID WC   loperamide  4 mg Oral Q6H   midodrine  15 mg Oral TID WC   multivitamin with minerals  1 tablet Oral BID   polycarbophil  625 mg Oral BID   saccharomyces boulardii  250 mg Oral BID   sodium chloride flush  3 mL Intravenous Q12H   sodium chloride  1 g Oral BID WC   thiamine  100 mg Oral Daily   Vitamin D (Ergocalciferol)  50,000 Units Oral Q7 days   Continuous Infusions:  sodium chloride 75 mL/hr at 09/18/23 1310   promethazine (PHENERGAN) injection (IM or IVPB) Stopped (09/14/23 1058)   sodium chloride      LOS: 15 days   Marguerita Merles, DO [facility] Available via Epic secure chat 7am-7pm After these hours, please refer to coverage provider listed on [email or web address] 09/18/2023, 2:19 PM

## 2023-09-17 NOTE — Plan of Care (Signed)
  Problem: Fluid Volume: Goal: Ability to maintain a balanced intake and output will improve Outcome: Not Progressing   Problem: Nutritional: Goal: Maintenance of adequate nutrition will improve Outcome: Progressing   Problem: Clinical Measurements: Goal: Cardiovascular complication will be avoided Outcome: Progressing   Problem: Activity: Goal: Risk for activity intolerance will decrease Outcome: Progressing   Problem: Coping: Goal: Level of anxiety will decrease Outcome: Not Progressing   Problem: Elimination: Goal: Will not experience complications related to bowel motility Outcome: Not Progressing Goal: Will not experience complications related to urinary retention Outcome: Progressing   Problem: Pain Managment: Goal: General experience of comfort will improve and/or be controlled Outcome: Progressing   Problem: Safety: Goal: Ability to remain free from injury will improve Outcome: Progressing   Problem: Skin Integrity: Goal: Risk for impaired skin integrity will decrease Outcome: Progressing

## 2023-09-17 NOTE — Progress Notes (Signed)
 Physical Therapy Treatment Patient Details Name: [name] MRN: [id number] DOB: Apr 07, 1956 Today's Date: 09/17/2023   History of Present Illness 68 y.o. female presents to [facility] 09/03/23 from [facility] due to abnormal lab work. Pt admitted with acute kidney failure, acidosis, and acute encephalopathy. Suspected uretal obstruction vs. Severe dehydration vs. Urosepsis etiology. 3/20 began CRRT. PMHx: atrophic kidney, recurrent AKI, cervical cancer, colovaginal fistula, pyelonephritis, renal atrophy, vertigo    PT Comments  Pt is very happy to see PT and Mobility specialist, reporting that her whole family has been to see her including all of her brothers. Pt is pleasantly confused calling all her brothers individually "Baldo Ash". Pt agreeable to getting up with therapy but requesting to go to bathroom first. Pt is contact guard for standing from recliner, walking to bathroom with RW, able to get up and down from toilet with use of hand rail. Pt contact guard for ambulation to sink where she washed her hands. Pt declined walking in the hallway reporting she is more tired than she thought she would be. Per notes pt to return with [facility]. PT will follow until discharge.      If plan is discharge home, recommend the following: Assistance with cooking/housework;Assist for transportation;Help with stairs or ramp for entrance;A little help with walking and/or transfers;A little help with bathing/dressing/bathroom   Can travel by private vehicle     Yes  Equipment Recommendations  None recommended by PT    Recommendations for Other Services       Precautions / Restrictions Precautions Precautions: Fall Recall of Precautions/Restrictions: Impaired Precaution/Restrictions Comments: R nephrostomy, R UQ ileostomy Restrictions Weight Bearing Restrictions Per Provider Order: No     Mobility  Bed Mobility Overal bed mobility: Needs Assistance Bed Mobility: Supine to Sit     Supine to  sit: HOB elevated, Min assist     General bed mobility comments: sitting up in recliner on entry    Transfers Overall transfer level: Needs assistance Equipment used: Rolling walker (2 wheels) Transfers: Sit to/from Stand Sit to Stand: Contact guard assist, Min assist, +2 safety/equipment, +2 physical assistance           General transfer comment: contact guard to rise from recliner and toilet.    Ambulation/Gait Ambulation/Gait assistance: Contact guard assist, +2 safety/equipment (Chair Follow) Gait Distance (Feet): 18 Feet (x2) Assistive device: Rolling walker (2 wheels) Gait Pattern/deviations: Step-through pattern, Decreased stride length Gait velocity: reduced     General Gait Details: pt able to walk from recliner into bathroom with contact guard assist, pt upset that colostomy bag is full again, After using bathroom pt able to walk out of bathroom with contact guard to come to RW, washed her hands and walked back to recliner         Balance Overall balance assessment: Needs assistance Sitting-balance support: No upper extremity supported, Feet supported Sitting balance-Leahy Scale: Fair Sitting balance - Comments: Pt sat EOB with Supervision.   Standing balance support: Bilateral upper extremity supported, During functional activity, Reliant on assistive device for balance Standing balance-Leahy Scale: Poor Standing balance comment: Pt dependent on RW for stability.                            Communication Communication Communication: No apparent difficulties  Cognition Arousal: Alert Behavior During Therapy: Flat affect   PT - Cognitive impairments: Memory, Attention, Initiation, Sequencing, Problem solving, Safety/Judgement, Awareness  PT - Cognition Comments: pt is very pleasantly confused, discussing how her whole family including her brothers have been to see them, reporting that she has 4 brothers and then  calls them all Baldo Ash Following commands: Impaired Following commands impaired: Only follows one step commands consistently, Follows one step commands with increased time    Cueing Cueing Techniques: Verbal cues, Tactile cues, Visual cues     General Comments General comments (skin integrity, edema, etc.): Pt no longer on tele, very plesant today, talks about family, missing her dogs and her father, reports she is looking forward to going home.      Pertinent Vitals/Pain Pain Assessment Pain Assessment: Faces Faces Pain Scale: Hurts a little bit Pain Location: abdomen with movement Pain Descriptors / Indicators: Aching, Discomfort, Sore, Tender Pain Intervention(s): Limited activity within patient's tolerance, Monitored during session     PT Goals (current goals can now be found in the care plan section) Acute Rehab PT Goals Patient Stated Goal: Feel better PT Goal Formulation: With patient Time For Goal Achievement: 09/18/23 Potential to Achieve Goals: Good Progress towards PT goals: Progressing toward goals    Frequency    Min 2X/week       AM-PAC PT "6 Clicks" Mobility   Outcome Measure  Help needed turning from your back to your side while in a flat bed without using bedrails?: A Little Help needed moving from lying on your back to sitting on the side of a flat bed without using bedrails?: A Little Help needed moving to and from a bed to a chair (including a wheelchair)?: Total Help needed standing up from a chair using your arms (e.g., wheelchair or bedside chair)?: Total Help needed to walk in hospital room?: Total Help needed climbing 3-5 steps with a railing? : Total 6 Click Score: 10    End of Session Equipment Utilized During Treatment: Gait belt Activity Tolerance: Patient tolerated treatment well;Patient limited by fatigue Patient left: in chair;with call bell/phone within reach;with chair alarm set;with family/visitor present Nurse Communication: Mobility  status;Other (comment) (HR during session) PT Visit Diagnosis: Other abnormalities of gait and mobility (R26.89);Muscle weakness (generalized) (M62.81)     Time: 8295-6213 PT Time Calculation (min) (ACUTE ONLY): 17 min  Charges:    $Therapeutic Activity: 8-22 mins PT General Charges $$ ACUTE PT VISIT: 1 Visit                     Lesli Issa B. Beverely Risen PT, DPT Acute Rehabilitation Services Please use secure chat or  Call Office [phone]    Elon Alas Fleet 09/17/2023, 4:00 PM

## 2023-09-17 NOTE — Hospital Course (Addendum)
 The patient is a 68 yr female with a significant past medical history for cervical cancer 2003 s/p hysterectomy, hx of TPN use/malnutrition, right hydronephrosis s/p uretal stent 03/2023 and R nephrostomy tube placed in 04/2023 due to hospitalization at [facility] for obstructive uropathy AKI and BL hydronephrosis complicated by Enterobacter and Candida UTI, rectovaginal s/p anterior resection with colovaginal fistula repair, loop ileostomy with closure in 2021, SBO s/p with lysis of adhesions, recurrent SBO, ileum/cecum resection with ileostomy creation 2024, and recent [facility] hospitalization (2/14-2/23/25) which revealed patient had severe dehydration in the setting of high output and poor oral intake who presents to [facility] ED from [facility] due to abnormal lab work indicating acute renal failure (Cr >10, BUN >169, Anion Gap acidosis, severe hyperkalemia-k>6, and hyponatremia Na 117. Due to severity of acute kidney failure and acidosis, PCCM was consulted for ICU admission and further management along with nephrology consult.  Underwent HD catheter placement and CRRT short-term and now creatinine is improved significantly.  She continues to have significant high output ileostomy and has had over 4 L in the last 24 hours so general surgery has been asked to reevaluate.  After further goals of care discussion the plan is to take the patient home with home hospice so that she could go to the beach and plan is to discharge her home on 09/19/2023.  She remains stable for discharge at this time.   Assessment and Plan:  Acute kidney injury secondary to ATN in the setting of shock/acute metabolic acidosis/electrolyte abnormalities with hypokalemia hypomagnesemia and hyponatremia -Presented with creatinine of 10 and a potassium of 6.6. Was seen by nephrology.  HD catheter was placed and patient underwent CRRT. -Renal function was normal back in January but has been greater than 2 since February.   --BUN/Cr Trend: Recent Labs  Lab 09/12/23 0524 09/13/23 0239 09/14/23 0509 09/15/23 0550 09/16/23 0554 09/17/23 0456 09/18/23 0548  BUN 29* 30* 38* 33* 29* 36* 41*  CREATININE 2.41* 2.39* 2.92* 2.64* 2.53* 2.61* 2.52*  -Creatinine had  initially improved but now worsening possibly due to poor oral intake and persistent high ileostomy output. N/V also contributed. -Resumed IVF with NS at 75 mL/hr that she continues have poor p.o. intake and nausea vomiting. -Avoid Nephrotoxic Medications, Contrast Dyes, Hypotension and Dehydration to Ensure Adequate Renal Perfusion and will need to Renally Adjust Meds -Continue to Monitor and Trend Renal Function carefully and repeat CMP in the AM  -Nephrology has signed off.  Dialysis catheter has been removed. Sodium level has improved.  Noted to be on salt tablets 1 g p.o. twice daily   High output ostomy: This is a main reason why she became hypovolemic and went into renal failure. Patient was given FiberCon, cholestyramine, Lomotil and Imodium.  No significant improvement was noted so she was started on octreotide with which she did have improvement in her output however became significantly nauseated and had multiple episodes of vomiting so octreotide had to be discontinued. X-ray done the day beforeyesterday did not show any evidence for ileus or obstruction. Output appears to still be very high. C/w Cholestyramine Light 4 grams po BID, Lomotil 1 tab po 4x Daily, Creon 36,000 TID, Polycarbophil 625 mg po BID, and Loperamide 4 mg po q6h. Discussed with General Surgery and they will re-consult in the AM   HypoK+: K+ is now 3.7 on last check. Replete with po Kcl 40 mEQ BID x2 yesterday. CTM and Replete as Necessary. Repeat CMP in the AM  HypoMag: Mg is now 2.0 on last check. Replete with IV Mag Sulfate 2 grams. CTM and Replete as Necessary in the AM  HypoNa+: Na+ is now 135 on last check. Resume IVF with NS @ 75 mL/hr. Will give a 500 mL bolus. CTM  and Trend and repeat CMP in the AM  Hypophosphatemia: Phos Level is now 2.2 on last check. Replete with po K Phos Neutral 500 mg po BID. CTM and trend and repeat CMP in the AM    Nausea and Vomiting: Possibly due to octreotide.  Abdominal film without any acute findings. C/w IV Zofran 4 mg q6hprn Nausea and IV Promethazine 12.5 mg q6hprn Refractory Nausea/Vomiting.  Symptoms appear to have improved.  CTM as it seems to be improving    Septic shock due to urinary tract infection/chronic hypotension Noted to be on Zosyn which has now completed.  No positive cultures noted.  However she does have a complicated urinary tract with presence of gastrostomy tube.   Looks like Zosyn was initiated on 3/22.  Prior to that she was on ceftriaxone. Now completed course of antibiotics on 4/1. She is noted to be on Midodrine 15 mg po TID and will be continued at D/C. Has low blood pressures but she is asymptomatic for the most part.     History of SBO with recurrent SBO's: Has had multiple surgeries previously mostly done at [facility].  Currently has ileostomy. Surgeon at [facility] has said that there was no role for reversing her ileostomy.  Surgery here was consulted since husband wanted another opinion.  They also do not plan any surgical interventions at this time.  Ileostomy reversal will be high risk. This can be further pursued in the outpatient setting.   Right [facility]: [facility].  This was changed by IR on 3/24.  Outpatient follow-up with her providers at [facility].    Acute Metabolic Encephalopathy: This is in the setting of electrolyte abnormalities and renal failure.  Seems to have improved.  Could be close to baseline. CTM and place on Delirium Precautions as mentation waxes and wanes   Hypoglycemia: She was noted to have low glucose levels.  Was noted to be on insulin though she denied history of diabetes.  HbA1c was 5.7. Insulin products were discontinued.   Hypoglycemia appears to have resolved.     Normocytic Anemia and Mild Thrombocytopenia -Low hemoglobin noted but no evidence of overt bleeding.  Has been stable for several days. Hgb/Hct and Plt Count Trend: Recent Labs  Lab 09/06/23 0356 09/09/23 0436 09/10/23 0404 09/12/23 0524 09/14/23 0509 09/17/23 0456 09/18/23 0548  HGB 8.0* 6.7* 7.9* 7.4* 8.8* 9.0* 8.4*  HCT 21.9* 19.0* 22.2* 21.9* 25.6* 25.9* 23.5*  MCV 93.6 96.0 93.3 96.9 95.5 98.5 97.5  PLT 113* 159 133* 146* 210 215 197  -Check Anemia Panel in the outpatient setting. CTM for S/Sx of Bleeding; No overt bleeding noted. Repeat CBC in the AM    History of Neuropathic Pain: C/w Gabapentin 100 mg po q8h   Hypothyroidism: C/w Levothyroxine 25 mcg po Daily.   Goals of care: Palliative care has been following. Spoke at length to patient and her husband at bedside on 3/30.  He seems quite frustrated with lack of progress.  He does not think that rehab will be beneficial since she has tried a month of rehab a few months ago without much improvement.  But he wants to discuss this further with his daughter and the palliative  care.  After discussions regarding CODE STATUS patient to be changed over to DNR.  She does not want resuscitation or be put on life support.   Hospice is being pursed now after further GOC discussion and will D/C home with Home Hospice 09/19/23  Severe Malnutrition in the Context of Chronic illness/ Underweight / Hx of Use of TPN during previous Hospitalizations at [facility]: Nutrition Status: Nutrition Problem: Severe Malnutrition Etiology: chronic illness (recurrent GI surgeries and hospitalizations (5 in the last 6 months), recurrent TPN use) Signs/Symptoms: severe fat depletion, severe muscle depletion, percent weight loss Percent weight loss: 24 % (3 months) Interventions: Ensure Enlive (each supplement provides 350kcal and 20 grams of protein), MVI, Magic cup -Against the use of NG tube and has been refusing  TNF as well. -Oral intake had improved but then worsened due to nausea vomiting.  Hopefully it will improve going forward. Continue to encourage. -After further goals of care discussion palliative met with the patient and the husband and plan is to pursue home with Home Hospice

## 2023-09-17 NOTE — Progress Notes (Addendum)
 Asked to re-consult on this patient for high output ileostomy (over 4L/24h). I see she is on imodium, fiber. I have added lomotil 1 tab QID. CCS will formally see the patient tomorrow morning on rounds.   Hosie Spangle, PA-C [facility] Please see Amion for pager number during day hours 7:00am-4:30pm

## 2023-09-17 NOTE — Progress Notes (Signed)
 Whole pills noted throughout shift in ileostomy output

## 2023-09-17 NOTE — Progress Notes (Signed)
 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 Due to severity of acute kidney failure and acidosis, PCCM was consulted for ICU admission and further management along with nephrology consult.  Underwent HD catheter placement and CRRT short-term and now creatinine is improved significantly.  She continues to have significant high output ileostomy and has had over 4 L in the last 24 hours so general surgery has been asked to reevaluate.   Assessment and Plan:  Acute kidney injury secondary to ATN in the setting of shock/acute metabolic acidosis/electrolyte abnormalities with hypokalemia hypomagnesemia and hyponatremia -Presented with creatinine of 10 and a potassium of 6.6. Was seen by nephrology.  HD catheter was placed and patient underwent CRRT. -Renal function was normal back in January but has been greater than 2 since February.  --BUN/Cr Trend: Recent Labs  Lab 09/11/23 0425 09/12/23 0524 09/13/23 0239 09/14/23 0509  09/15/23 0550 09/16/23 0554 09/17/23 0456  BUN 31* 29* 30* 38* 33* 29* 36*  CREATININE 2.42* 2.41* 2.39* 2.92* 2.64* 2.53* 2.61*  -Creatinine had  initially improved but now worsening possibly due to poor oral intake and persistent high ileostomy output. N/V also contributed. -Resume IVF with NS at 75 mL/hr that she continues have poor p.o. intake and nausea vomiting. -Avoid Nephrotoxic Medications, Contrast Dyes, Hypotension and Dehydration to Ensure Adequate Renal Perfusion and will need to Renally Adjust Meds -Continue to Monitor and Trend Renal Function carefully and repeat CMP in the AM  -Nephrology has signed off.  Dialysis catheter has been removed. Sodium level has improved.  Noted to be on salt tablets 1 g p.o. twice daily   High output ostomy: This is a main reason why she became hypovolemic and went into renal failure. Patient was given FiberCon, cholestyramine, Lomotil and Imodium.  No significant improvement was noted so she was started on octreotide with which she did have improvement in her output however became significantly nauseated and had multiple episodes of vomiting so octreotide had to be discontinued. X-ray done the day beforeyesterday did not show any evidence for ileus or obstruction. Output appears to still be very high. C/w Cholestyramine Light 4 grams po BID, Lomotil 1 tab po 4x Daily, Creon 36,000 TID, Polycarbophil 625 mg po BID, and Loperamide 4 mg po q6h. Discussed with General Surgery and they will re-consult in the AM   HypoK+: K+ is now 3.2. Replete with po Kcl 40 mEQ BID x2. CTM and Replete as Necessary. Repeat CMP in the AM  HypoMag: Mg is now 1.6. Replete with IV Mag Sulfate 2 grams. CTM and Replete as Necessary in the AM  HypoNa+: Na+ is  now 134. Resume IVF with NS @ 75 mL/hr. CTM and Trend and repeat CMP in the AM   Nausea and Vomiting: Possibly due to octreotide.  Abdominal film without any acute findings. C/w IV Zofran 4 mg q6hprn Nausea and IV  Promethazine 12.5 mg q6hprn Refractory Nausea/Vomiting.  Symptoms appear to have improved.  CTM   Septic shock due to urinary tract infection/chronic hypotension Noted to be on Zosyn which has now completed.  No positive cultures noted.  However she does have a complicated urinary tract with presence of gastrostomy tube.   Looks like Zosyn was initiated on 3/22.  Prior to that she was on ceftriaxone. Now completed course of antibiotics on 4/1. She is noted to be on Midodrine 15 mg po TID.  Has low blood pressures but she is asymptomatic for the most part.     History of SBO with recurrent SBO's: Has had multiple surgeries previously mostly done at [facility] [place].  Currently has ileostomy. Surgeon at [facility] has said that there was no role for reversing her ileostomy.  Surgery here was consulted since husband wanted another opinion.  They also do not plan any surgical interventions at this time.  Ileostomy reversal will be high risk. This can be further pursued in the outpatient setting.   Right [facility]: [facility].  This was changed by IR on 3/24.  Outpatient follow-up with her providers at [facility].    Acute Metabolic Encephalopathy: This is in the setting of electrolyte abnormalities and renal failure.  Seems to have improved.  Could be close to baseline. CTM and place on Delirium Precautions   Hypoglycemia: She was noted to have low glucose levels.  Was noted to be on insulin though she denied history of diabetes.  HbA1c was 5.7. Insulin products were discontinued.  Hypoglycemia appears to have resolved.     Normocytic Anemia and Mild Thrombocytopenia -Low hemoglobin noted but no evidence of overt bleeding.  Has been stable for several days. Hgb/Hct and Plt Count Trend: Recent Labs  Lab 09/05/23 1700 09/06/23 0356 09/09/23 0436 09/10/23 0404 09/12/23 0524 09/14/23 0509 09/17/23 0456  HGB 7.4* 8.0* 6.7* 7.9* 7.4* 8.8* 9.0*  HCT 20.2* 21.9* 19.0* 22.2* 21.9*  25.6* 25.9*  MCV  --  93.6 96.0 93.3 96.9 95.5 98.5  PLT  --  113* 159 133* 146* 210 215  -Check Anemia Panel in the AM. CTM for S/Sx of Bleeding; No overt bleeding noted. Repeat CBC in the AM    History of Neuropathic Pain: C/w Gabapentin 100 mg po q8h   Hypothyroidism: C/w Levothyroxine 25 mcg po Daily.   Goals of care: Palliative care has been following. Spoke at length to patient and her husband at bedside on 3/30.  He seems quite frustrated with lack of progress.  He does not think that rehab will be beneficial since she has tried a month of rehab a few months ago without much improvement.  But he wants to discuss this further with his daughter and the palliative care.  After discussions regarding CODE STATUS patient to be changed over to DNR.  She does not want resuscitation or be put on life support.   Hospice is being considered.  Severe Malnutrition in the Context of Chronic illness/ Underweight / Hx of Use of TPN during previous Hospitalizations at [facility]: Nutrition Status: Nutrition Problem: Severe Malnutrition Etiology: chronic illness (recurrent GI surgeries and hospitalizations (5 in the last 6 months), recurrent TPN use) Signs/Symptoms: severe fat  depletion, severe muscle depletion, percent weight loss Percent weight loss: 24 % (3 months) Interventions: Ensure Enlive (each supplement provides 350kcal and 20 grams of protein), MVI, Magic cup -Against the use of NG tube and has been refusing TNF as well. -Oral intake had improved but then worsened due to nausea vomiting.  Hopefully it will improve going forward. Continue to encourage.   DVT prophylaxis: heparin injection 5,000 Units Start: 09/03/23 2200 SCDs Start: 09/03/23 1948    Code Status: Limited: Do not attempt resuscitation (DNR) -DNR-LIMITED -Do Not Intubate/DNI  Family Communication: Discussed with husband at bedside  Disposition Plan:  Level of care: Med-Surg Status is: Inpatient Remains inpatient appropriate  because: Continues to have High output ileostomy   Consultants:  Palliative Care Medicine General Surgery Gastroenterology Nephrology PCCM Transfer  Procedures:  As delineated as above  Antimicrobials:  Anti-infectives (From admission, onward)    Start     Dose/Rate Route Frequency Ordered Stop   09/13/23 1600  fluconazole (DIFLUCAN) tablet 150 mg        150 mg Oral  Once 09/13/23 1507 09/13/23 1655   09/06/23 1400  piperacillin-tazobactam (ZOSYN) IVPB 2.25 g        2.25 g 100 mL/hr over 30 Minutes Intravenous Every 8 hours 09/06/23 0940 09/16/23 2222   09/05/23 0100  vancomycin (VANCOREADY) IVPB 500 mg/100 mL  Status:  Discontinued        500 mg 100 mL/hr over 60 Minutes Intravenous Every 24 hours 09/04/23 0128 09/06/23 0920   09/04/23 0000  piperacillin-tazobactam (ZOSYN) IVPB 3.375 g  Status:  Discontinued        3.375 g 100 mL/hr over 30 Minutes Intravenous Every 6 hours 09/03/23 2307 09/06/23 0940   09/03/23 2330  vancomycin (VANCOCIN) IVPB 1000 mg/200 mL premix        1,000 mg 200 mL/hr over 60 Minutes Intravenous  Once 09/03/23 2307 09/04/23 0215   09/03/23 2200  cefTRIAXone (ROCEPHIN) 1 g in sodium chloride 0.9 % 100 mL IVPB  Status:  Discontinued        1 g 200 mL/hr over 30 Minutes Intravenous Every 24 hours 09/03/23 2121 09/03/23 2249   09/03/23 2200  fluconazole (DIFLUCAN) IVPB 400 mg  Status:  Discontinued        400 mg 100 mL/hr over 120 Minutes Intravenous Every 24 hours 09/03/23 2121 09/04/23 1627       Subjective: Seen and examined at bedside and was sitting in the chair and was frustrated.  Continues to have some nausea.  No lightheadedness or dizziness.  Appetite remains poor.  No other concerns or complaints at this time.  Objective: Vitals:   09/16/23 1427 09/17/23 0005 09/17/23 0349 09/17/23 0900  BP: 100/64 (!) 93/59 (!) 91/56 98/63  Pulse: 62 64 65   Resp: 17 16 19    Temp: 98.8 F (37.1 C) 98.3 F (36.8 C) 98.2 F (36.8 C)   TempSrc: Oral   Oral   SpO2:  99% 99%   Weight:   41.5 kg   Height:        Intake/Output Summary (Last 24 hours) at 09/17/2023 1726 Last data filed at 09/17/2023 1620 Gross per 24 hour  Intake 600 ml  Output 4175 ml  Net -3575 ml   Filed Weights   09/15/23 0425 09/16/23 0500 09/17/23 0349  Weight: 44 kg 42 kg 41.5 kg   Examination: Physical Exam:  Constitutional: Thin chronically ill-appearing Caucasian female in no acute distress Respiratory: Diminished to auscultation bilaterally, no  wheezing, rales, rhonchi or crackles. Normal respiratory effort and patient is not tachypenic. No accessory muscle use.  Unlabored breathing Cardiovascular: RRR, no murmurs / rubs / gallops. S1 and S2 auscultated. No extremity edema.  Abdomen: Soft, mildly-tender, non-distended. Bowel sounds positive.  GU: Deferred. Musculoskeletal: No clubbing / cyanosis of digits/nails. No joint deformity upper and lower extremities.  Skin: No rashes, lesions, ulcers. No induration; Warm and dry.  Neurologic: CN 2-12 grossly intact with no focal deficits. Romberg sign and cerebellar reflexes not assessed.  Psychiatric: Normal judgment and insight. Alert and oriented x 3. Normal mood and appropriate affect.   Data Reviewed: I have personally reviewed following labs and imaging studies  CBC: Recent Labs  Lab 09/12/23 0524 09/14/23 0509 09/17/23 0456  WBC 3.9* 5.0 4.3  HGB 7.4* 8.8* 9.0*  HCT 21.9* 25.6* 25.9*  MCV 96.9 95.5 98.5  PLT 146* 210 215   Basic Metabolic Panel: Recent Labs  Lab 09/11/23 0425 09/11/23 1610 09/12/23 0524 09/13/23 0239 09/13/23 0600 09/13/23 1657 09/14/23 0509 09/15/23 0550 09/16/23 0554 09/17/23 0456  NA 135  --  133* 135  --   --  133* 137 137 134*  K 3.1*   < > 3.0* 2.7*  --  3.8 4.0 3.9 3.6 3.2*  CL 92*  --  92* 89*  --   --  88* 99 100 92*  CO2 34*  --  31 34*  --   --  31 23 27 30   GLUCOSE 74  --  139* 80  --   --  112* 98 117* 119*  BUN 31*  --  29* 30*  --   --  38* 33* 29* 36*   CREATININE 2.42*  --  2.41* 2.39*  --   --  2.92* 2.64* 2.53* 2.61*  CALCIUM 8.5*  --  8.5* 9.0  --   --  9.4 9.2 9.0 9.3  MG 1.4*  --  2.1  --  1.7  --  2.2  --   --  1.6*  PHOS 2.8  --  2.9 2.9  --   --   --   --   --  2.5   < > = values in this interval not displayed.   GFR: Estimated Creatinine Clearance: 13.7 mL/min (A) (by C-G formula based on SCr of 2.61 mg/dL (H)). Liver Function Tests: Recent Labs  Lab 09/11/23 0425 09/12/23 0524 09/13/23 0239  ALBUMIN 1.9* 1.9* 2.2*   No results for input(s): "LIPASE", "AMYLASE" in the last 168 hours. No results for input(s): "AMMONIA" in the last 168 hours. Coagulation Profile: No results for input(s): "INR", "PROTIME" in the last 168 hours. Cardiac Enzymes: No results for input(s): "CKTOTAL", "CKMB", "CKMBINDEX", "TROPONINI" in the last 168 hours. BNP (last 3 results) No results for input(s): "PROBNP" in the last 8760 hours. HbA1C: No results for input(s): "HGBA1C" in the last 72 hours. CBG: Recent Labs  Lab 09/16/23 1616 09/16/23 2138 09/17/23 0745 09/17/23 1214 09/17/23 1628  GLUCAP 124* 171* 125* 291* 217*   Lipid Profile: No results for input(s): "CHOL", "HDL", "LDLCALC", "TRIG", "CHOLHDL", "LDLDIRECT" in the last 72 hours. Thyroid Function Tests: No results for input(s): "TSH", "T4TOTAL", "FREET4", "T3FREE", "THYROIDAB" in the last 72 hours. Anemia Panel: No results for input(s): "VITAMINB12", "FOLATE", "FERRITIN", "TIBC", "IRON", "RETICCTPCT" in the last 72 hours. Sepsis Labs: No results for input(s): "PROCALCITON", "LATICACIDVEN" in the last 168 hours.  No results found for this or any previous visit (from the past 240 hours).  Radiology Studies: No results found.  Scheduled Meds:  ascorbic acid  250 mg Oral BID   cholestyramine light  4 g Oral Q12H   vitamin B-12  1,000 mcg Oral Daily   diphenoxylate-atropine  1 tablet Oral QID   feeding supplement  237 mL Oral TID BM   folic acid  1 mg Oral Daily    gabapentin  100 mg Oral Q8H   heparin  5,000 Units Subcutaneous Q8H   levothyroxine  25 mcg Oral Q0600   lipase/protease/amylase  36,000 Units Oral TID WC   loperamide  4 mg Oral Q6H   midodrine  15 mg Oral TID WC   multivitamin with minerals  1 tablet Oral BID   polycarbophil  625 mg Oral BID   potassium chloride  40 mEq Oral BID   sodium chloride flush  3 mL Intravenous Q12H   sodium chloride  1 g Oral BID WC   thiamine  100 mg Oral Daily   Vitamin D (Ergocalciferol)  50,000 Units Oral Q7 days   Continuous Infusions:  sodium chloride 75 mL (09/17/23 1117)   magnesium sulfate bolus IVPB     promethazine (PHENERGAN) injection (IM or IVPB) Stopped (09/14/23 1058)    LOS: 14 days   Marguerita Merles, DO [facility] Available via Epic secure chat 7am-7pm After these hours, please refer to coverage provider listed on [email or web address] 09/17/2023, 5:26 PM

## 2023-09-18 ENCOUNTER — Encounter (HOSPITAL_COMMUNITY)
Admission: EM | Disposition: A | Discharge status: Hospice | Payer: Self-pay | Source: Ambulatory Visit | Attending: Internal Medicine

## 2023-09-18 ENCOUNTER — Other Ambulatory Visit (HOSPITAL_COMMUNITY): Payer: Self-pay

## 2023-09-18 DIAGNOSIS — E43 Unspecified severe protein-calorie malnutrition: Secondary | ICD-10-CM | POA: Diagnosis not present

## 2023-09-18 DIAGNOSIS — Z66 Do not resuscitate: Secondary | ICD-10-CM | POA: Diagnosis not present

## 2023-09-18 DIAGNOSIS — Z7189 Other specified counseling: Secondary | ICD-10-CM | POA: Diagnosis not present

## 2023-09-18 DIAGNOSIS — Z932 Ileostomy status: Secondary | ICD-10-CM

## 2023-09-18 DIAGNOSIS — N179 Acute kidney failure, unspecified: Secondary | ICD-10-CM | POA: Diagnosis not present

## 2023-09-18 DIAGNOSIS — R198 Other specified symptoms and signs involving the digestive system and abdomen: Secondary | ICD-10-CM

## 2023-09-18 DIAGNOSIS — Z515 Encounter for palliative care: Secondary | ICD-10-CM | POA: Diagnosis not present

## 2023-09-18 DIAGNOSIS — E875 Hyperkalemia: Secondary | ICD-10-CM | POA: Diagnosis not present

## 2023-09-18 LAB — COMPREHENSIVE METABOLIC PANEL WITH GFR
ALT: 25 U/L (ref 0–44)
AST: 21 U/L (ref 15–41)
Albumin: 2.6 g/dL — ABNORMAL LOW (ref 3.5–5.0)
Alkaline Phosphatase: 146 U/L — ABNORMAL HIGH (ref 38–126)
Anion gap: 13 (ref 5–15)
BUN: 41 mg/dL — ABNORMAL HIGH (ref 8–23)
CO2: 28 mmol/L (ref 22–32)
Calcium: 9.1 mg/dL (ref 8.9–10.3)
Chloride: 94 mmol/L — ABNORMAL LOW (ref 98–111)
Creatinine, Ser: 2.52 mg/dL — ABNORMAL HIGH (ref 0.44–1.00)
GFR, Estimated: 20 mL/min — ABNORMAL LOW (ref >60)
Glucose, Bld: 103 mg/dL — ABNORMAL HIGH (ref 70–99)
Potassium: 3.7 mmol/L (ref 3.5–5.1)
Sodium: 135 mmol/L (ref 135–145)
Total Bilirubin: 0.5 mg/dL (ref 0.0–1.2)
Total Protein: 6.1 g/dL — ABNORMAL LOW (ref 6.5–8.1)

## 2023-09-18 LAB — CBC WITH DIFFERENTIAL/PLATELET
Abs Immature Granulocytes: 0.02 10*3/uL (ref 0.00–0.07)
Basophils Absolute: 0 10*3/uL (ref 0.0–0.1)
Basophils Relative: 1 %
Eosinophils Absolute: 0.2 10*3/uL (ref 0.0–0.5)
Eosinophils Relative: 5 %
HCT: 23.5 % — ABNORMAL LOW (ref 36.0–46.0)
Hemoglobin: 8.4 g/dL — ABNORMAL LOW (ref 12.0–15.0)
Immature Granulocytes: 1 %
Lymphocytes Relative: 31 %
Lymphs Abs: 1 10*3/uL (ref 0.7–4.0)
MCH: 34.9 pg — ABNORMAL HIGH (ref 26.0–34.0)
MCHC: 35.7 g/dL (ref 30.0–36.0)
MCV: 97.5 fL (ref 80.0–100.0)
Monocytes Absolute: 0.2 10*3/uL (ref 0.1–1.0)
Monocytes Relative: 8 %
Neutro Abs: 1.7 10*3/uL (ref 1.7–7.7)
Neutrophils Relative %: 54 %
Platelets: 197 10*3/uL (ref 150–400)
RBC: 2.41 MIL/uL — ABNORMAL LOW (ref 3.87–5.11)
RDW: 17.2 % — ABNORMAL HIGH (ref 11.5–15.5)
WBC: 3.1 10*3/uL — ABNORMAL LOW (ref 4.0–10.5)
nRBC: 0 % (ref 0.0–0.2)

## 2023-09-18 LAB — GLUCOSE, CAPILLARY
Glucose-Capillary: 114 mg/dL — ABNORMAL HIGH (ref 70–99)
Glucose-Capillary: 244 mg/dL — ABNORMAL HIGH (ref 70–99)
Glucose-Capillary: 167 mg/dL — ABNORMAL HIGH (ref 70–99)
Glucose-Capillary: 168 mg/dL — ABNORMAL HIGH (ref 70–99)

## 2023-09-18 LAB — MAGNESIUM: Magnesium: 2 mg/dL (ref 1.7–2.4)

## 2023-09-18 LAB — PHOSPHORUS: Phosphorus: 2.2 mg/dL — ABNORMAL LOW (ref 2.5–4.6)

## 2023-09-18 SURGERY — IRRIGATION AND DEBRIDEMENT ABSCESS
Anesthesia: General | Laterality: Left

## 2023-09-18 MED ORDER — SODIUM CHLORIDE 1 G PO TABS
1.0000 g | ORAL_TABLET | Freq: Two times a day (BID) | ORAL | 0 refills | Status: DC
  Filled 2023-09-18: qty 28, 14d supply, fill #0

## 2023-09-18 MED ORDER — CYANOCOBALAMIN 1000 MCG PO TABS
1000.0000 ug | ORAL_TABLET | Freq: Every day | ORAL | 0 refills | Status: DC
Start: 2023-09-19 — End: 2023-09-19
  Filled 2023-09-18: qty 30, 30d supply, fill #0

## 2023-09-18 MED ORDER — VITAMIN D (ERGOCALCIFEROL) 1.25 MG (50000 UNIT) PO CAPS
50000.0000 [IU] | ORAL_CAPSULE | ORAL | 0 refills | Status: DC
  Filled 2023-09-18: qty 5, 35d supply, fill #0

## 2023-09-18 MED ORDER — LOPERAMIDE HCL 2 MG PO CAPS
4.0000 mg | ORAL_CAPSULE | Freq: Four times a day (QID) | ORAL | 0 refills | Status: DC
  Filled 2023-09-18: qty 30, 4d supply, fill #0

## 2023-09-18 MED ORDER — CALCIUM POLYCARBOPHIL 625 MG PO TABS
625.0000 mg | ORAL_TABLET | Freq: Two times a day (BID) | ORAL | 0 refills | Status: DC
  Filled 2023-09-18: qty 60, 30d supply, fill #0

## 2023-09-18 MED ORDER — ASCORBIC ACID 500 MG PO TABS
250.0000 mg | ORAL_TABLET | Freq: Two times a day (BID) | ORAL | 0 refills | Status: DC
  Filled 2023-09-18: qty 30, 30d supply, fill #0

## 2023-09-18 MED ORDER — THIAMINE HCL 100 MG PO TABS
100.0000 mg | ORAL_TABLET | Freq: Every day | ORAL | 0 refills | Status: DC
  Filled 2023-09-18: qty 30, 30d supply, fill #0

## 2023-09-18 MED ORDER — MIDODRINE HCL 5 MG PO TABS
15.0000 mg | ORAL_TABLET | Freq: Three times a day (TID) | ORAL | 0 refills | Status: DC
  Filled 2023-09-18: qty 270, 30d supply, fill #0

## 2023-09-18 MED ORDER — SACCHAROMYCES BOULARDII 250 MG PO CAPS
250.0000 mg | ORAL_CAPSULE | Freq: Two times a day (BID) | ORAL | Status: DC
Start: 2023-09-18 — End: 2023-09-19
  Administered 2023-09-18 – 2023-09-19 (×3): 250 mg via ORAL
  Filled 2023-09-18 (×4): qty 1

## 2023-09-18 MED ORDER — ENSURE ENLIVE PO LIQD
237.0000 mL | Freq: Three times a day (TID) | ORAL | 12 refills | Status: DC
  Filled 2023-09-18: qty 237, 1d supply, fill #0

## 2023-09-18 MED ORDER — FOLIC ACID 1 MG PO TABS
1.0000 mg | ORAL_TABLET | Freq: Every day | ORAL | 0 refills | Status: DC
  Filled 2023-09-18: qty 30, 30d supply, fill #0

## 2023-09-18 MED ORDER — ACIDOPHILUS LACTOBACILLUS PO CAPS
1.0000 | ORAL_CAPSULE | Freq: Two times a day (BID) | ORAL | 0 refills | Status: DC
  Filled 2023-09-18: qty 60, 30d supply, fill #0

## 2023-09-18 MED ORDER — GABAPENTIN 100 MG PO CAPS
100.0000 mg | ORAL_CAPSULE | Freq: Three times a day (TID) | ORAL | 0 refills | Status: DC
  Filled 2023-09-18: qty 90, 30d supply, fill #0

## 2023-09-18 MED ORDER — SODIUM CHLORIDE 0.9 % IV BOLUS
500.0000 mL | Freq: Once | INTRAVENOUS | Status: AC
  Administered 2023-09-18: 500 mL via INTRAVENOUS

## 2023-09-18 MED ORDER — DIPHENOXYLATE-ATROPINE 2.5-0.025 MG PO TABS
1.0000 | ORAL_TABLET | Freq: Four times a day (QID) | ORAL | 0 refills | Status: DC
  Filled 2023-09-18: qty 30, 8d supply, fill #0

## 2023-09-18 NOTE — Progress Notes (Signed)
 OT Cancellation Note  Patient Details Name: PUANANI GENE MRN: [id number] DOB: 12/11/1955   Cancelled Treatment:    Reason Eval/Treat Not Completed: Other (comment) (Per RN, pt and her husband are currently discussing POC with MD. RN requests OT reattempting to see pt at a later time as appropriate/available.)  Jodee Wagenaar "Ronaldo Miyamoto" M., OTR/L, MA Acute Rehab [phone]   Lendon Colonel 09/18/2023, 11:08 AM

## 2023-09-18 NOTE — Progress Notes (Addendum)
 Daily Progress Note   Patient Name: [name]       Date: 09/18/2023 DOB: [birth year]  Age: 68 y.o. MRN#: [id number] Attending Physician: Merlene Laughter, DO Primary Care Physician: Patient, [name] Admit Date: 09/03/2023 Length of Stay: 15 days  Reason for Consultation/Follow-up: Establishing goals of care  HPI/Patient Profile:  68 y.o. 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  She presented from outpatient nephrology with critical labs including creatinine of 10.15.  She was admitted on 09/03/2023 with AKI due to ischemic ATN from septic shock, multiple electrolyte abnormalities, septic shock due to urinary tract infection, acute metabolic encephalopathy due to sepsis and uremia, history of recurrent SBO, severe malnutrition, high output ostomy, and others.    Palliative medicine was consulted for GOC conversations.  Subjective:   Subjective: Chart Reviewed. Updates received. Patient Assessed. Created space and opportunity for patient  and family to explore thoughts and feelings regarding current medical situation.  Today's Discussion: This morning I received a message from the nurse indicating the patient's husband wanted to talk to me about how to move forward.  When went to the bedside the patient's husband came out of the room and we excused ourselves to the conference/family area.  We spent a good amount of time again reviewing her current situation.  We had an in-depth and detailed discussion with the overall take away being that he understands this is not a  fixable situation.  He stated "I know they will not do the surgery.  If I was the surgeon I do not know if I would either with how sick and weak she is."  We reviewed his conversation with hospitalist Dr. Marland Mcalpine, where an the patient told Dr. Richardson Chiquito that they are not solving anything here just prolonging things.  He explains that they have been trying to treat her specific situations/clinical conditions but without much success.  I shared that I agreed with his situation that at this point and this has been the case for the hospitalization, we are not able to fix her problems despite the best efforts and we are just merely prolonging her life and a quality that she would not find acceptable, per our previous conversations.  He shared that they have been talking about going home.  He seems agreeable to hospice as previously discussed.  He states that there is an she wants to do including washing the dishes, cleaning the floor, and going through stuff for her children.  I shared that while some of this may not be possible there are certainly things that she could do.  Finally, he shares his desire to be able to take her to [place] and sit on the beach and walk systems at 1 more time before she passes.  I shared that she is care guard assist, could reasonably be able to ambulate short distances, and husband has been caring for her ostomy for some time and is pretty experienced.  He stated he would make it to go back with the medical kit and it.  I shared that if this is something they agree that she wants to do I think  it would be wonderful for them to be able to get to the beach for 1 more sunset.  Asked him if hospice is reached out yet and he said not yet.  I shared that they had indicated they would do an assessment visit when the patient discharged home.  However, they should be calling him to discuss DME needs.  I told him I would discuss with the social worker.  The patient's husband shared that it is time  for her to be told fully what is going on.  We spent some time discussing how we will handle this as she is a very sharp individual but has memory problems and frequently forgets.  We discussed explaining the care she is getting and whether he wants to name it is "hospice" is up to him.  He would like to attempt this conversation by himself but I shared that I would be outside the room for any assistance needed.  After the patient and her husband had some time to talk he asked me to comment.  We spent some time talking these and things that bring her happiness.  I asked what they talked about and she said "sorting things through for my children" and being comfortable.  I reviewed hospice care, as previously done, as care that would allow her to go home, be comfortable, and make the Raymond of the time she has.  She shares that her husband is talked about going to [place] and I said that I thought this would be a great idea if they are able to make that happen.  She states that she is very determined to do so.  I offered that I would reach out to hospice and asked them to contact her husband [name] for any needs.  She said there is nothing that they need, but I encouraged her to except the follow along assistance so that something is needed in the future they would have it and she seemed to reluctantly agree.  I shared that I would be back in a couple days, but I anticipate that they would be discharged home by then.  I ensured [name] had our contact number and I encouraged him to call for any questions or concerns. I provided emotional and general support through therapeutic listening, empathy, sharing of stories, and other techniques. I answered all questions and addressed all concerns to the best of my ability.  After the meeting I discussed with TOC in the hallway and she states that she will call hospice to find out why they have not contacted them yet.  I debriefed with the medical team and nursing teams  via secure chat.  Review of Systems  Respiratory:  Negative for shortness of breath.   Cardiovascular:  Negative for chest pain.  Gastrointestinal:  Negative for abdominal pain, nausea and vomiting.    Objective:   Vital Signs:  BP 116/66 (BP Location: Right Arm)   Pulse 62   Temp 97.9 F (36.6 C)   Resp 15   Ht 5\' 2"  (1.575 m)   Wt 41.8 kg   SpO2 100%   BMI 16.86 kg/m   Physical Exam Vitals and nursing note reviewed.  Constitutional:      General: She is not in acute distress.    Appearance: She is ill-appearing.  HENT:     Head: Normocephalic and atraumatic.  Cardiovascular:     Rate and Rhythm: Normal rate.  Pulmonary:     Effort: No respiratory distress.  Breath sounds: No wheezing or rhonchi.  Abdominal:     General: Abdomen is flat. There is no distension.     Palpations: Abdomen is soft.  Neurological:     General: No focal deficit present.     Mental Status: She is alert.  Psychiatric:        Mood and Affect: Mood normal.        Behavior: Behavior normal.     Palliative Assessment/Data: 40-50%    Existing Vynca/ACP Documentation: None  Assessment & Plan:   Impression: Present on Admission:  Acute kidney injury (HCC)  68 year old female with acute presentation of chronic comorbidities as described above. The patient has an extensive surgical history as noted above. She has a high output ostomy which they were hoping to get reversed, but surgery here has recommended she return to [facility]. At last visit Dr. [name] at [facility] counseled about high risk and patient was quite hesitant, but Dr. [name] offered to revisit in the future if desired.  Despite all medical attempts to slow down ostomy output and provide for a good quality of life we have been unsuccessful.  At this point our options are TPN and fluids at home as "artificial support" to prolong her life versus comfort care.  After substantial discussion family has elected comfort care with an  explanation to the patient in a more paternal sense (as noted above) due to her lack of capacity to participate in decision-making. Hospice has now been consulted. Overall prognosis poor   SUMMARY OF RECOMMENDATIONS   DNR Continue current scope of care TOC to follow-up with [facility] hospice upon contacting husband for needs Anticipate discharge home with hospice Palliative medicine will follow-up Thursday  Symptom Management:  Per primary team PMT is available to assist as needed  Code Status: Full code  Prognosis: Unable to determine  Discharge Planning: To Be Determined  Discussed with: Patient, family, medical team, nursing team, [facility] team  Thank you for allowing [place] to participate in the care of KHANH CORDNER PMT will continue to support holistically.  Time Total: 97 min  Detailed review of medical records (labs, imaging, vital signs), medically appropriate exam, discussed with treatment team, counseling and education to patient, family, & staff, documenting clinical information, medication management, coordination of care  Wynne Dust, NP Palliative Medicine Team  Team Phone # [phone] (Nights/Weekends)  02/13/2021, 8:17 AM

## 2023-09-18 NOTE — Plan of Care (Signed)
  Problem: Activity: Goal: Risk for activity intolerance will decrease Outcome: Progressing   Problem: Nutrition: Goal: Adequate nutrition will be maintained Outcome: Progressing   Problem: Coping: Goal: Level of anxiety will decrease Outcome: Progressing   Problem: Elimination: Goal: Will not experience complications related to urinary retention Outcome: Progressing   Problem: Pain Managment: Goal: General experience of comfort will improve and/or be controlled Outcome: Progressing

## 2023-09-18 NOTE — Progress Notes (Signed)
   Subjective/Chief Complaint: Admitted again for AKI with high output ileostomy, she is eating well, she is unsure of what output has been at home, over 3 liters since yesterda   Objective: Vital signs in last 24 hours: Temp:  [98.1 F (36.7 C)-98.6 F (37 C)] 98.1 F (36.7 C) (04/03 0442) Pulse Rate:  [67] 67 (04/03 0442) Resp:  [15-16] 15 (04/03 0442) BP: (93-108)/(52-70) 93/52 (04/03 0442) SpO2:  [99 %] 99 % (04/03 0442) Weight:  [41.8 kg] 41.8 kg (04/03 0442) Last BM Date : 09/17/23  Intake/Output from previous day: 04/02 0701 - 04/03 0700 In: 1150 [P.O.:1150] Out: 4150 [Urine:775; Stool:3375] Intake/Output this shift: No intake/output data recorded.  Ab soft nontender ileostomy with mostly liquid stool present  Lab Results:  Recent Labs    09/17/23 0456 09/18/23 0548  WBC 4.3 3.1*  HGB 9.0* 8.4*  HCT 25.9* 23.5*  PLT 215 197   BMET Recent Labs    09/17/23 0456 09/18/23 0548  NA 134* 135  K 3.2* 3.7  CL 92* 94*  CO2 30 28  GLUCOSE 119* 103*  BUN 36* 41*  CREATININE 2.61* 2.52*  CALCIUM 9.3 9.1   PT/INR No results for input(s): "LABPROT", "INR" in the last 72 hours. ABG No results for input(s): "PHART", "HCO3" in the last 72 hours.  Invalid input(s): "PCO2", "PO2"  Studies/Results: No results found.  Anti-infectives: Anti-infectives (From admission, onward)    Start     Dose/Rate Route Frequency Ordered Stop   09/13/23 1600  fluconazole (DIFLUCAN) tablet 150 mg        150 mg Oral  Once 09/13/23 1507 09/13/23 1655   09/06/23 1400  piperacillin-tazobactam (ZOSYN) IVPB 2.25 g        2.25 g 100 mL/hr over 30 Minutes Intravenous Every 8 hours 09/06/23 0940 09/16/23 2222   09/05/23 0100  vancomycin (VANCOREADY) IVPB 500 mg/100 mL  Status:  Discontinued        500 mg 100 mL/hr over 60 Minutes Intravenous Every 24 hours 09/04/23 0128 09/06/23 0920   09/04/23 0000  piperacillin-tazobactam (ZOSYN) IVPB 3.375 g  Status:  Discontinued        3.375  g 100 mL/hr over 30 Minutes Intravenous Every 6 hours 09/03/23 2307 09/06/23 0940   09/03/23 2330  vancomycin (VANCOCIN) IVPB 1000 mg/200 mL premix        1,000 mg 200 mL/hr over 60 Minutes Intravenous  Once 09/03/23 2307 09/04/23 0215   09/03/23 2200  cefTRIAXone (ROCEPHIN) 1 g in sodium chloride 0.9 % 100 mL IVPB  Status:  Discontinued        1 g 200 mL/hr over 30 Minutes Intravenous Every 24 hours 09/03/23 2121 09/03/23 2249   09/03/23 2200  fluconazole (DIFLUCAN) IVPB 400 mg  Status:  Discontinued        400 mg 100 mL/hr over 120 Minutes Intravenous Every 24 hours 09/03/23 2121 09/04/23 1627       Assessment/Plan: AKI, high output ileostomy -continue current meds to see if slow down but this is doubtful as she has been admitted several times for this -I think ultimately she needs to consider takedown although reviewing op reports this is likely going to be difficult -[facility]  I reviewed hospitalist notes, last 24 h vitals and pain scores, last 48 h intake and output, last 24 h labs and trends, and last 24 h imaging results.      Emelia Loron 09/18/2023

## 2023-09-18 NOTE — TOC Progression Note (Addendum)
 Transition of Care [facility]) - Progression Note    Patient Details  Name: [name] MRN: [id number] Date of Birth: [birth year]  Transition of Care [facility]) CM/SW Contact  Graves-Bigelow, Lamar Laundry, RN Phone Number: 09/18/2023, 1:04 PM  Clinical Narrative: Case Manager spoke with Minerva Areola Palliative NP and he stated that the spouse has not heard from [facility] regarding Hospice Referral. Case Manager reached back out to [facility] and spoke with [facility] regarding providing a phone call to the spouse. Office should be reaching back out to the spouse today to discuss any DME needs. Per Minerva Areola Palliative NP patient has a goal to get to [place]. Case Manager will continue to follow for additional needs.       1339 09-18-23 Liaison Keka spoke with the spouse and he declined DME states she has rolling walker and BSC in the home. Spouse asked Gertie Exon not to come out until next Tuesday. However, Liaison [facility] asked when patient would DC and Case Manager explained that it could be as early as this evening; she stated she will reach back out tomorrow to see how the spouse is so he will not be overwhelmed once they get home. MD plans to discharge today. No further needs identified from Case Manager.   Expected Discharge Plan: Home w Hospice Care Barriers to Discharge: Continued Medical Work up  Expected Discharge Plan and Services   Discharge Planning Services: CM Consult Post Acute Care Choice: Hospice Living arrangements for the past 2 months: Single Family Home    HH Arranged: RN [facility] Agency: [facility] ([facility]) Date HH Agency Contacted: 09/16/23 Time HH Agency Contacted: 1215 Representative spoke with at [facility] Agency: Deanna Artis  Social Determinants of Health (SDOH) Interventions SDOH Screenings   Food Insecurity: No Food Insecurity (09/04/2023)  Housing: Low Risk  (09/04/2023)  Transportation Needs: No Transportation Needs (09/04/2023)   Utilities: Not At Risk (09/04/2023)  Depression (PHQ2-9): Low Risk  (12/06/2022)  Financial Resource Strain: Low Risk  (08/11/2023)   Received from [facility]  Social Connections: Moderately Isolated (09/04/2023)  Stress: No Stress Concern Present (11/06/2022)  Tobacco Use: Medium Risk (09/04/2023)   Readmission Risk Interventions    09/16/2023   12:37 PM 06/24/2023    2:33 PM 11/21/2022   10:10 AM  Readmission Risk Prevention Plan  Transportation Screening Complete Complete Complete  PCP or Specialist Appt within 3-5 Days  Complete   HRI or Home Care Consult  Complete Complete  Social Work Consult for Recovery Care Planning/Counseling  Complete Complete  Palliative Care Screening  Not Applicable Not Applicable  Medication Review Oceanographer)  Complete Complete  HRI or Home Care Consult Complete    SW Recovery Care/Counseling Consult Complete    Palliative Care Screening Complete    Skilled Nursing Facility Patient Refused

## 2023-09-18 NOTE — Progress Notes (Signed)
 Occupational Therapy Treatment Patient Details Name: [name] MRN: [id number] DOB: 1956-03-17 Today's Date: 09/18/2023   History of present illness 68 y.o. female presents to [facility] 09/03/23 from [facility] due to abnormal lab work. Pt admitted with acute kidney failure, acidosis, and acute encephalopathy. Suspected uretal obstruction vs. Severe dehydration vs. Urosepsis etiology. 3/20 began CRRT. PMHx: atrophic kidney, recurrent AKI, cervical cancer, colovaginal fistula, pyelonephritis, renal atrophy, vertigo   OT comments  OT session focused on techniques for increased safety and independence with ADLs, including improved sequencing ability and ability to follow multi-step instructions. Pt currently largely completes ADLs with Set up to Max assist of +1 to +2 for safety and requires set-by-step cues to sequence all tasks except for self-feeding. Pt now demonstrates ability to consisently follow 1-step instructions and inconsistently follow 2-step instructions with increased time. Pt participated well in session and is making slow but consistent progress toward goals except for sequencing goal. OT goals updated this day based on pt progress and current functional level. Pt will benefit from continued acute skilled OT services to address deficits outlined below, increase safety and independence with functional tasks, and decreased caregiver burden. Per RN, plan for pt to discharge home with hospice services.       If plan is discharge home, recommend the following:  Two people to help with walking and/or transfers;A lot of help with bathing/dressing/bathroom;Assistance with cooking/housework;Assistance with feeding;Direct supervision/assist for medications management;Direct supervision/assist for financial management;Assist for transportation;Help with stairs or ramp for entrance;Supervision due to cognitive status (Set up for self feeding)   Equipment Recommendations       Recommendations for  Other Services      Precautions / Restrictions Precautions Precautions: Fall Recall of Precautions/Restrictions: Impaired Precaution/Restrictions Comments: R nephrostomy, R UQ ileostomy Restrictions Weight Bearing Restrictions Per Provider Order: No       Mobility Bed Mobility Overal bed mobility: Needs Assistance             General bed mobility comments: Pt scooted up in bed and repositioned hips to improve posture in midline sitting in bed with HOB elevated with Max asisist and use of bed pad    Transfers Overall transfer level: Needs assistance                 General transfer comment: Pt declined OOB transfers this day with no reason specified     Balance                                           ADL either performed or assessed with clinical judgement   ADL Overall ADL's : Needs assistance/impaired Eating/Feeding: Set up;Bed level (with HOB elevated)   Grooming: Set up;Bed level;Cueing for sequencing (with HOB elevated)   Upper Body Bathing: Contact guard assist;Minimal assistance;Cueing for sequencing;Bed level (with HOB elevated)       Upper Body Dressing : Contact guard assist;Bed level;Cueing for sequencing (with HOB elevated)                     General ADL Comments: Pt demonstrates ability to consistently follow 1-step commands and inconsistently follow 2-step commands this session with increased time. Pt requiring step-by-step cues to sequence all tasks except for self-feeding. Pt declining sitting EOB or OOB activity this session with no reason specified. Please also see note in Nurse Comunication.    Extremity/Trunk Assessment  Upper Extremity Assessment Upper Extremity Assessment: Right hand dominant;Overall WFL for tasks assessed (Gross B UE strength 4- to 4/5)   Lower Extremity Assessment Lower Extremity Assessment: Defer to PT evaluation        Vision       Perception     Praxis     Communication  Communication Communication: No apparent difficulties   Cognition Arousal: Alert Behavior During Therapy: WFL for tasks assessed/performed Cognition: Cognition impaired, History of cognitive impairments   Orientation impairments: Situation, Time (Oriented to self and being at [facility]; disoriented to situation and current date/time) Awareness: Intellectual awareness impaired, Online awareness impaired Memory impairment (select all impairments): Short-term memory, Working [employer] fast streamer, Engineer, structural memory Attention impairment (select first level of impairment): Selective attention Executive functioning impairment (select all impairments): Organization, Sequencing, Reasoning, Problem solving OT - Cognition Comments: Pt pleasantly confused throughout session with cognitive deficits noted.                 Following commands: Impaired Following commands impaired: Only follows one step commands consistently, Follows one step commands with increased time, Follows multi-step commands inconsistently, Follows multi-step commands with increased time      Cueing   Cueing Techniques: Verbal cues, Visual cues  Exercises      Shoulder Instructions       General Comments VSS on RA throughout session. Pt's husband present throughout session.    Pertinent Vitals/ Pain       Pain Assessment Pain Assessment: No/denies pain Pain Intervention(s): Monitored during session  Home Living                                          Prior Functioning/Environment              Frequency  Min 2X/week        Progress Toward Goals  OT Goals(current goals can now be found in the care plan section)  Progress towards OT goals: Progressing toward goals;Not progressing toward goals - comment;Goals updated (Pt making slow but consistent progress toward all goals except sequencing goal. Sequencing goal discharged at this time.)  Acute Rehab OT Goals Patient Stated Goal: to return  home with her husband OT Goal Formulation: With patient/family Time For Goal Achievement: 09/25/23 Potential to Achieve Goals: Fair ADL Goals Pt Will Perform Grooming: with contact guard assist;standing Pt Will Perform Lower Body Bathing: with min assist;sit to/from stand;sitting/lateral leans Pt Will Perform Lower Body Dressing: with min assist;sitting/lateral leans;sit to/from stand Additional ADL Goal #1: Patient will demonstrate ability to follow 2-step commands appropriately with Mod I in 4/4 opportunities to increase safety and independence during functional tasks.  Plan      Co-evaluation                 AM-PAC OT "6 Clicks" Daily Activity     Outcome Measure   Help from another person eating meals?: A Little Help from another person taking care of personal grooming?: A Little Help from another person toileting, which includes using toliet, bedpan, or urinal?: Total (ileostomy and nephrostomy) Help from another person bathing (including washing, rinsing, drying)?: A Lot Help from another person to put on and taking off regular upper body clothing?: A Little Help from another person to put on and taking off regular lower body clothing?: A Lot 6 Click Score: 14    End of Session  OT Visit Diagnosis: Other symptoms and signs involving cognitive function   Activity Tolerance Patient tolerated treatment well;Other (comment) (Pt with self-limiting behavior, declining sitting EOB or OOB activity)   Patient Left in bed;with call bell/phone within reach;with bed alarm set;with family/visitor present   Nurse Communication Mobility status;Other (comment) (RN reports plan for pt to return home with hospice and instructs OT to limit session to pt's tolerance and preferrences.)        Time: 1610-9604 OT Time Calculation (min): 18 min  Charges: OT General Charges $OT Visit: 1 Visit OT Treatments $Self Care/Home Management : 8-22 mins  Kenyon Eichelberger "Orson Eva., OTR/L, MA Acute  Rehab [phone]   Lendon Colonel 09/18/2023, 3:56 PM

## 2023-09-19 DIAGNOSIS — N179 Acute kidney failure, unspecified: Secondary | ICD-10-CM | POA: Diagnosis not present

## 2023-09-19 DIAGNOSIS — E875 Hyperkalemia: Secondary | ICD-10-CM | POA: Diagnosis not present

## 2023-09-19 DIAGNOSIS — A419 Sepsis, unspecified organism: Secondary | ICD-10-CM | POA: Diagnosis not present

## 2023-09-19 DIAGNOSIS — E43 Unspecified severe protein-calorie malnutrition: Secondary | ICD-10-CM | POA: Diagnosis not present

## 2023-09-19 LAB — GLUCOSE, CAPILLARY: Glucose-Capillary: 182 mg/dL — ABNORMAL HIGH (ref 70–99)

## 2023-09-19 MED ORDER — CYANOCOBALAMIN 1000 MCG PO TABS: 1000.0000 ug | ORAL_TABLET | Freq: Every day | ORAL | 0 refills | Status: AC

## 2023-09-19 MED ORDER — THIAMINE HCL 100 MG PO TABS: 100.0000 mg | ORAL_TABLET | Freq: Every day | ORAL | 0 refills | Status: AC

## 2023-09-19 MED ORDER — ASCORBIC ACID 500 MG PO TABS: 250.0000 mg | ORAL_TABLET | Freq: Two times a day (BID) | ORAL | 0 refills | Status: AC

## 2023-09-19 MED ORDER — VITAMIN D (ERGOCALCIFEROL) 1.25 MG (50000 UNIT) PO CAPS: 50000.0000 [IU] | ORAL_CAPSULE | ORAL | 0 refills | Status: AC

## 2023-09-19 MED ORDER — MIDODRINE HCL 5 MG PO TABS: 15.0000 mg | ORAL_TABLET | Freq: Three times a day (TID) | ORAL | 0 refills | Status: AC

## 2023-09-19 MED ORDER — FOLIC ACID 1 MG PO TABS: 1.0000 mg | ORAL_TABLET | Freq: Every day | ORAL | 0 refills | Status: AC

## 2023-09-19 MED ORDER — PANCRELIPASE (LIP-PROT-AMYL) 36000-114000 UNITS PO CPEP: 36000.0000 [IU] | ORAL_CAPSULE | Freq: Three times a day (TID) | ORAL | 0 refills | Status: AC

## 2023-09-19 MED ORDER — SODIUM CHLORIDE 1 G PO TABS: 1.0000 g | ORAL_TABLET | Freq: Two times a day (BID) | ORAL | 0 refills | Status: AC

## 2023-09-19 MED ORDER — ACIDOPHILUS LACTOBACILLUS PO CAPS: 1.0000 | ORAL_CAPSULE | Freq: Two times a day (BID) | ORAL | 0 refills | Status: AC

## 2023-09-19 MED ORDER — ONDANSETRON HCL 4 MG PO TABS
4.0000 mg | ORAL_TABLET | Freq: Four times a day (QID) | ORAL | 0 refills | Status: AC | PRN
Start: 2023-09-19 — End: ?

## 2023-09-19 MED ORDER — LOPERAMIDE HCL 2 MG PO CAPS
4.0000 mg | ORAL_CAPSULE | Freq: Four times a day (QID) | ORAL | 0 refills | Status: AC
Start: 2023-09-19 — End: ?

## 2023-09-19 MED ORDER — CALCIUM POLYCARBOPHIL 625 MG PO TABS: 625.0000 mg | ORAL_TABLET | Freq: Two times a day (BID) | ORAL | 0 refills | Status: AC

## 2023-09-19 MED ORDER — GABAPENTIN 100 MG PO CAPS: 100.0000 mg | ORAL_CAPSULE | Freq: Three times a day (TID) | ORAL | 0 refills | Status: AC

## 2023-09-19 MED ORDER — DIPHENOXYLATE-ATROPINE 2.5-0.025 MG PO TABS: 1.0000 | ORAL_TABLET | Freq: Four times a day (QID) | ORAL | 0 refills | Status: AC

## 2023-09-19 MED ORDER — ENSURE ENLIVE PO LIQD: 237.0000 mL | Freq: Three times a day (TID) | ORAL | 12 refills | Status: AC

## 2023-09-19 NOTE — Consult Note (Signed)
 [facility] Liaison Consult Note    09/19/2023  CRISSIE ALOI 31-Jan-1956 [id number]  Extreme high risk for unplanned readmission f/u 15 day LLOS  Reviewed follow up to disposition: Home with Hospice with [facility] per inpatient [facility] RN notes.  Will sign off, need to be met in hospice.  Charlesetta Shanks, RN, BSN, CCM [employer], [facility] Liaison Direct Dial: [phone] or secure chat Email: [email or web address]

## 2023-09-19 NOTE — TOC Transition Note (Signed)
 Transition of Care [facility]) - Discharge Note   Patient Details  Name: [name] MRN: [id number] Date of Birth: 1955-12-26  Transition of Care [facility]) CM/SW Contact:  Gala Lewandowsky, RN Phone Number: 09/19/2023, 11:18 AM   Clinical Narrative:  Patient will discharge home today. [facility] will follow the patient; however, spouse has asked [facility] to call on Tuesday at the earliest for the initial assessment. Gertie Exon is aware that the patient will transition home today. No DME has been ordered as per request of spouse. No further needs identified.  Final next level of care: Home w Hospice Care (Hospice to call on Tuesday for the initial assessment per spouses wishes.) Barriers to Discharge: No Barriers Identified  Patient Goals and CMS Choice Patient states their goals for this hospitalization and ongoing recovery are:: Spouse is agreeable to Hospice   Choice offered to / list presented to : Patient Brooks ownership interest in [facility].provided to:: Spouse   Discharge Plan and Services Additional resources added to the After Visit Summary for     Discharge Planning Services: CM Consult Post Acute Care Choice: Hospice             HH Arranged: RN [facility] Agency: [facility] ([facility]) Date [facility] Agency Contacted: 09/16/23 Time HH Agency Contacted: 1215 Representative spoke with at [facility] Agency: Deanna Artis  Social Drivers of Health (SDOH) Interventions SDOH Screenings   Food Insecurity: No Food Insecurity (09/04/2023)  Housing: Low Risk  (09/04/2023)  Transportation Needs: No Transportation Needs (09/04/2023)  Utilities: Not At Risk (09/04/2023)  Depression (PHQ2-9): Low Risk  (12/06/2022)  Financial Resource Strain: Low Risk  (08/11/2023)   Received from [facility]  Social Connections: Moderately Isolated (09/04/2023)  Stress: No Stress Concern Present (11/06/2022)  Tobacco Use: Medium Risk (09/04/2023)     Readmission Risk Interventions    09/16/2023   12:37 PM 06/24/2023    2:33 PM 11/21/2022   10:10 AM  Readmission Risk Prevention Plan  Transportation Screening Complete Complete Complete  PCP or Specialist Appt within 3-5 Days  Complete   HRI or Home Care Consult  Complete Complete  Social Work Consult for Recovery Care Planning/Counseling  Complete Complete  Palliative Care Screening  Not Applicable Not Applicable  Medication Review Oceanographer)  Complete Complete  HRI or Home Care Consult Complete    SW Recovery Care/Counseling Consult Complete    Palliative Care Screening Complete    Skilled Nursing Facility Patient Refused

## 2023-09-19 NOTE — Progress Notes (Signed)
 SLP Cancellation Note  Patient Details Name: [name] MRN: [id number] DOB: September 02, 1955   Cancelled treatment:       Reason Eval/Treat Not Completed: Patient at procedure or test/unavailable  Attempted cognitive linguistic treatment, however, pt currently unavailable. Will continue efforts.   Kamri Gotsch B. Murvin Natal, [facility], CCC-SLP Speech Language Pathologist Office: [phone]  [name] 09/19/2023, 10:56 AM

## 2023-09-19 NOTE — Progress Notes (Signed)
 Physical Therapy Treatment Patient Details Name: [name] MRN: [id number] DOB: 1956-03-06 Today's Date: 09/19/2023   History of Present Illness 68 y.o. female presents to [facility] 09/03/23 from [facility] due to abnormal lab work. Pt admitted with acute kidney failure, acidosis, and acute encephalopathy. Suspected uretal obstruction vs. Severe dehydration vs. Urosepsis etiology. 3/20 began CRRT. PMHx: atrophic kidney, recurrent AKI, cervical cancer, colovaginal fistula, pyelonephritis, renal atrophy, vertigo    PT Comments  Pt greeted supine in bed, husband present in room, pleasant and agreeable to PT session. She is making steady progress towards her acute PT goals demonstrated by less physical assistance required and advanced functional mobility. Pt completed supine>sit with CGA and OOB mobility using RW with CGA and +2 for safety/equipment management. She increased her gait distance ambulating ~227ft. Pt engaged in stair training ascending/descending 4 steps with BHR. Will continue to follow acutely.      If plan is discharge home, recommend the following: Assistance with cooking/housework;Assist for transportation;Help with stairs or ramp for entrance;A little help with walking and/or transfers;A little help with bathing/dressing/bathroom   Can travel by private vehicle     Yes  Equipment Recommendations  None recommended by PT (Pt already has DME)    Recommendations for Other Services       Precautions / Restrictions Precautions Precautions: Fall Recall of Precautions/Restrictions: Impaired Precaution/Restrictions Comments: R nephrostomy, R UQ ileostomy Restrictions Weight Bearing Restrictions Per Provider Order: No     Mobility  Bed Mobility Overal bed mobility: Needs Assistance Bed Mobility: Supine to Sit     Supine to sit: HOB elevated, Contact guard     General bed mobility comments: Pt sat up on R side of bed, brought BLE off EOB, pulled on PT to scoot fwd til  feet flat with increased time.    Transfers Overall transfer level: Needs assistance Equipment used: Rolling walker (2 wheels) Transfers: Sit to/from Stand Sit to Stand: Contact guard assist, +2 safety/equipment ([employer] managing IV and IV pole.)           General transfer comment: Pt stood from lowest bed height and commode. VC for proper hand positioning using RW and grab bar. She required CGA to power up into standing. Good eccentric control with sitting.    Ambulation/Gait Ambulation/Gait assistance: Contact guard assist, +2 safety/equipment (Chair Follow) Gait Distance (Feet): 250 Feet Assistive device: Rolling walker (2 wheels) Gait Pattern/deviations: Step-through pattern, Decreased stride length Gait velocity: reduced Gait velocity interpretation: <1.8 ft/sec, indicate of risk for recurrent falls   General Gait Details: Pt ambulated with a reciprocal gait pattern, even weight shift, and good foot clearence. She maintained body inside RW and navigated around obstacles in room/hallway with intermittent VC. No overt LOB noted.   Stairs Stairs: Yes Stairs assistance: Contact guard assist, +2 safety/equipment ([employer] managing IV and IV pole.) Stair Management: Two rails, Forwards, Step to pattern Number of Stairs: 4 General stair comments: Pt ascended leading with RLE and descended leading with LLE. She was able to turn around on the 4th step without LOB.   Wheelchair Mobility     Tilt Bed    Modified Rankin (Stroke Patients Only)       Balance Overall balance assessment: Needs assistance Sitting-balance support: No upper extremity supported, Feet supported Sitting balance-Leahy Scale: Fair Sitting balance - Comments: Pt sat EOB with Supervision. PT donned pt's shoes. Pt attended to pericare seated on toilet.   Standing balance support: Bilateral upper extremity supported, During functional activity,  Reliant on assistive device for  balance, No upper extremity supported Standing balance-Leahy Scale: Poor Standing balance comment: Pt stood and washed hands with trunk leaning on sink basin for increased support. She is dependent on RW for stability during gait.                            Communication Communication Communication: No apparent difficulties  Cognition Arousal: Alert Behavior During Therapy: WFL for tasks assessed/performed   PT - Cognitive impairments: Memory, Attention, Initiation, Sequencing, Problem solving, Safety/Judgement, Awareness                       PT - Cognition Comments: Pt is very pleasantly confused, requires VC for technique and safety. Following commands: Impaired Following commands impaired: Only follows one step commands consistently, Follows one step commands with increased time, Follows multi-step commands inconsistently, Follows multi-step commands with increased time    Cueing Cueing Techniques: Verbal cues, Gestural cues  Exercises      General Comments General comments (skin integrity, edema, etc.): VSS on RA throughout session. Husband present in room.      Pertinent Vitals/Pain Pain Assessment Pain Assessment: No/denies pain    Home Living                          Prior Function            PT Goals (current goals can now be found in the care plan section) Acute Rehab PT Goals Patient Stated Goal: Return Home Progress towards PT goals: Progressing toward goals    Frequency    Min 2X/week      PT Plan      Co-evaluation              AM-PAC PT "6 Clicks" Mobility   Outcome Measure  Help needed turning from your back to your side while in a flat bed without using bedrails?: A Little Help needed moving from lying on your back to sitting on the side of a flat bed without using bedrails?: A Little Help needed moving to and from a bed to a chair (including a wheelchair)?: Total Help needed standing up from a chair  using your arms (e.g., wheelchair or bedside chair)?: Total Help needed to walk in hospital room?: Total Help needed climbing 3-5 steps with a railing? : Total 6 Click Score: 10    End of Session Equipment Utilized During Treatment: Gait belt Activity Tolerance: Patient tolerated treatment well Patient left: in chair;with call bell/phone within reach;with chair alarm set;with family/visitor present Nurse Communication: Mobility status PT Visit Diagnosis: Other abnormalities of gait and mobility (R26.89);Muscle weakness (generalized) (M62.81)     Time: 1000-1024 PT Time Calculation (min) (ACUTE ONLY): 24 min  Charges:    $Gait Training: 23-37 mins PT General Charges $$ ACUTE PT VISIT: 1 Visit                     Cheri Guppy, PT, DPT Acute Rehabilitation Services Office: [phone] Secure Chat Preferred  Richardson Chiquito 09/19/2023, 11:25 AM

## 2023-09-19 NOTE — Care Management Important Message (Signed)
 Important Message  Patient Details  Name: [name] MRN: [id number] Date of Birth: 04-Jul-1955   Important Message Given:  Yes - Medicare IM     Renie Ora 09/19/2023, 10:22 AM

## 2023-09-19 NOTE — Progress Notes (Signed)
 Subjective/Chief Complaint: Feels much better   Objective: Vital signs in last 24 hours: Temp:  [97.7 F (36.5 C)-98.1 F (36.7 C)] 97.8 F (36.6 C) (04/04 0718) Pulse Rate:  [61-82] 67 (04/04 0718) Resp:  [15-18] 18 (04/04 0718) BP: (90-150)/(60-74) 90/62 (04/04 0718) SpO2:  [98 %-100 %] 98 % (04/04 0718) Weight:  [42.7 kg] 42.7 kg (04/04 0556) Last BM Date : 09/19/23  Intake/Output from previous day: 04/03 0701 - 04/04 0700 In: 480 [P.O.:480] Out: 4820 [Urine:1520; Stool:3300] Intake/Output this shift: No intake/output data recorded.  Ab soft nontender  Lab Results:  Recent Labs    09/17/23 0456 09/18/23 0548  WBC 4.3 3.1*  HGB 9.0* 8.4*  HCT 25.9* 23.5*  PLT 215 197   BMET Recent Labs    09/17/23 0456 09/18/23 0548  NA 134* 135  K 3.2* 3.7  CL 92* 94*  CO2 30 28  GLUCOSE 119* 103*  BUN 36* 41*  CREATININE 2.61* 2.52*  CALCIUM 9.3 9.1   PT/INR No results for input(s): "LABPROT", "INR" in the last 72 hours. ABG No results for input(s): "PHART", "HCO3" in the last 72 hours.  Invalid input(s): "PCO2", "PO2"  Studies/Results: No results found.  Anti-infectives: Anti-infectives (From admission, onward)    Start     Dose/Rate Route Frequency Ordered Stop   09/13/23 1600  fluconazole (DIFLUCAN) tablet 150 mg        150 mg Oral  Once 09/13/23 1507 09/13/23 1655   09/06/23 1400  piperacillin-tazobactam (ZOSYN) IVPB 2.25 g        2.25 g 100 mL/hr over 30 Minutes Intravenous Every 8 hours 09/06/23 0940 09/16/23 2222   09/05/23 0100  vancomycin (VANCOREADY) IVPB 500 mg/100 mL  Status:  Discontinued        500 mg 100 mL/hr over 60 Minutes Intravenous Every 24 hours 09/04/23 0128 09/06/23 0920   09/04/23 0000  piperacillin-tazobactam (ZOSYN) IVPB 3.375 g  Status:  Discontinued        3.375 g 100 mL/hr over 30 Minutes Intravenous Every 6 hours 09/03/23 2307 09/06/23 0940   09/03/23 2330  vancomycin (VANCOCIN) IVPB 1000 mg/200 mL premix        1,000  mg 200 mL/hr over 60 Minutes Intravenous  Once 09/03/23 2307 09/04/23 0215   09/03/23 2200  cefTRIAXone (ROCEPHIN) 1 g in sodium chloride 0.9 % 100 mL IVPB  Status:  Discontinued        1 g 200 mL/hr over 30 Minutes Intravenous Every 24 hours 09/03/23 2121 09/03/23 2249   09/03/23 2200  fluconazole (DIFLUCAN) IVPB 400 mg  Status:  Discontinued        400 mg 100 mL/hr over 120 Minutes Intravenous Every 24 hours 09/03/23 2121 09/04/23 1627       Assessment/Plan: AKI, high output ileostomy -continue current meds to see if slow down but this is doubtful as she has been admitted several times for this -I think ultimately she needs to consider takedown although reviewing op reports this is likely going to be difficult. I made it clear today and messaged palliative NP that we have not said surgery is not an option.  I think if she is going to get surgery for this it makes much more sense to see the person who did her last surgery.  The last note from [facility] states if she wants to discuss takedown to return to their office.  She does not know if she wants surgery and that is different.  -can follow  up as outpatient at [facility] -please call back if needed  I reviewed last 24 h vitals and pain scores, last 48 h intake and output, last 24 h labs and trends, and last 24 h imaging results.      [name] 09/19/2023

## 2023-09-19 NOTE — Discharge Summary (Signed)
 Physician Discharge Summary   Patient: [name] MRN: [id number] DOB: September 22, 1955  Admit date:     09/03/2023  Discharge date: 09/19/23  Discharge Physician: Marguerita Merles, DO   PCP: Patient, [name]   Recommendations at discharge:    Further Care per Hospice Protocol   Discharge Diagnoses: Principal Problem:   Acute kidney injury [facility]) Active Problems:   Hyperkalemia   Protein-calorie malnutrition, severe   Septic shock (HCC)   AKI (acute kidney injury) (HCC)   Goals of care, counseling/discussion   Pressure injury of skin  Resolved Problems:   * No resolved hospital problems. [facility] Course: The patient is a 68 yr female with a significant past medical history for cervical cancer 2003 s/p hysterectomy, hx of TPN use/malnutrition, right hydronephrosis s/p uretal stent 03/2023 and R nephrostomy tube placed in 04/2023 due to hospitalization at [facility] for obstructive uropathy AKI and BL hydronephrosis complicated by Enterobacter and Candida UTI, rectovaginal s/p anterior resection with colovaginal fistula repair, loop ileostomy with closure in 2021, SBO s/p with lysis of adhesions, recurrent SBO, ileum/cecum resection with ileostomy creation 2024, and recent [facility] hospitalization (2/14-2/23/25) which revealed patient had severe dehydration in the setting of high output and poor oral intake who presents to [facility] ED from [facility] due to abnormal lab work indicating acute renal failure (Cr >10, BUN >169, Anion Gap acidosis, severe hyperkalemia-k>6, and hyponatremia Na 117. Due to severity of acute kidney failure and acidosis, PCCM was consulted for ICU admission and further management along with nephrology consult.  Underwent HD catheter placement and CRRT short-term and now creatinine is improved significantly.  She continues to have significant high output ileostomy and has had over 4 L in the last 24 hours so general surgery has been asked to reevaluate.  After  further goals of care discussion the plan is to take the patient home with home hospice so that she could go to the beach and plan is to discharge her home on 09/19/2023.  She remains stable for discharge at this time.   Assessment and Plan:  Acute kidney injury secondary to ATN in the setting of shock/acute metabolic acidosis/electrolyte abnormalities with hypokalemia hypomagnesemia and hyponatremia -Presented with creatinine of 10 and a potassium of 6.6. Was seen by nephrology.  HD catheter was placed and patient underwent CRRT. -Renal function was normal back in January but has been greater than 2 since February.  --BUN/Cr Trend: Recent Labs  Lab 09/12/23 0524 09/13/23 0239 09/14/23 0509 09/15/23 0550 09/16/23 0554 09/17/23 0456 09/18/23 0548  BUN 29* 30* 38* 33* 29* 36* 41*  CREATININE 2.41* 2.39* 2.92* 2.64* 2.53* 2.61* 2.52*  -Creatinine had  initially improved but now worsening possibly due to poor oral intake and persistent high ileostomy output. N/V also contributed. -Resumed IVF with NS at 75 mL/hr that she continues have poor p.o. intake and nausea vomiting. -Avoid Nephrotoxic Medications, Contrast Dyes, Hypotension and Dehydration to Ensure Adequate Renal Perfusion and will need to Renally Adjust Meds -Continue to Monitor and Trend Renal Function carefully and repeat CMP in the AM  -Nephrology has signed off.  Dialysis catheter has been removed. Sodium level has improved.  Noted to be on salt tablets 1 g p.o. twice daily   High output ostomy: This is a main reason why she became hypovolemic and went into renal failure. Patient was given FiberCon, cholestyramine, Lomotil and Imodium.  No significant improvement was noted so she was started on octreotide with which she  did have improvement in her output however became significantly nauseated and had multiple episodes of vomiting so octreotide had to be discontinued. X-ray done the day beforeyesterday did not show any evidence for  ileus or obstruction. Output appears to still be very high. C/w Cholestyramine Light 4 grams po BID, Lomotil 1 tab po 4x Daily, Creon 36,000 TID, Polycarbophil 625 mg po BID, and Loperamide 4 mg po q6h. Discussed with General Surgery and they will re-consult in the AM   HypoK+: K+ is now 3.7 on last check. Replete with po Kcl 40 mEQ BID x2 yesterday. CTM and Replete as Necessary. Repeat CMP in the AM  HypoMag: Mg is now 2.0 on last check. Replete with IV Mag Sulfate 2 grams. CTM and Replete as Necessary in the AM  HypoNa+: Na+ is now 135 on last check. Resume IVF with NS @ 75 mL/hr. Will give a 500 mL bolus. CTM and Trend and repeat CMP in the AM  Hypophosphatemia: Phos Level is now 2.2 on last check. Replete with po K Phos Neutral 500 mg po BID. CTM and trend and repeat CMP in the AM    Nausea and Vomiting: Possibly due to octreotide.  Abdominal film without any acute findings. C/w IV Zofran 4 mg q6hprn Nausea and IV Promethazine 12.5 mg q6hprn Refractory Nausea/Vomiting.  Symptoms appear to have improved.  CTM as it seems to be improving    Septic shock due to urinary tract infection/chronic hypotension Noted to be on Zosyn which has now completed.  No positive cultures noted.  However she does have a complicated urinary tract with presence of gastrostomy tube.   Looks like Zosyn was initiated on 3/22.  Prior to that she was on ceftriaxone. Now completed course of antibiotics on 4/1. She is noted to be on Midodrine 15 mg po TID and will be continued at D/C. Has low blood pressures but she is asymptomatic for the most part.     History of SBO with recurrent SBO's: Has had multiple surgeries previously mostly done at [facility].  Currently has ileostomy. Surgeon at [facility] has said that there was no role for reversing her ileostomy.  Surgery here was consulted since husband wanted another opinion.  They also do not plan any surgical interventions at this time.  Ileostomy reversal will be high risk.  This can be further pursued in the outpatient setting.   Right Nephrost[facility]: [facility].  This was changed by IR on 3/24.  Outpatient follow-up with her providers at [facility].    Acute Metabolic Encephalopathy: This is in the setting of electrolyte abnormalities and renal failure.  Seems to have improved.  Could be close to baseline. CTM and place on Delirium Precautions as mentation waxes and wanes   Hypoglycemia: She was noted to have low glucose levels.  Was noted to be on insulin though she denied history of diabetes.  HbA1c was 5.7. Insulin products were discontinued.  Hypoglycemia appears to have resolved.     Normocytic Anemia and Mild Thrombocytopenia -Low hemoglobin noted but no evidence of overt bleeding.  Has been stable for several days. Hgb/Hct and Plt Count Trend: Recent Labs  Lab 09/06/23 0356 09/09/23 0436 09/10/23 0404 09/12/23 0524 09/14/23 0509 09/17/23 0456 09/18/23 0548  HGB 8.0* 6.7* 7.9* 7.4* 8.8* 9.0* 8.4*  HCT 21.9* 19.0* 22.2* 21.9* 25.6* 25.9* 23.5*  MCV 93.6 96.0 93.3 96.9 95.5 98.5 97.5  PLT 113* 159 133* 146* 210 215 197  -Check Anemia  Panel in the outpatient setting. CTM for S/Sx of Bleeding; No overt bleeding noted. Repeat CBC in the AM    History of Neuropathic Pain: C/w Gabapentin 100 mg po q8h   Hypothyroidism: C/w Levothyroxine 25 mcg po Daily.   Goals of care: Palliative care has been following. Spoke at length to patient and her husband at bedside on 3/30.  He seems quite frustrated with lack of progress.  He does not think that rehab will be beneficial since she has tried a month of rehab a few months ago without much improvement.  But he wants to discuss this further with his daughter and the palliative care.  After discussions regarding CODE STATUS patient to be changed over to DNR.  She does not want resuscitation or be put on life support.   Hospice is being pursed now after further GOC discussion and will D/C  home with Home Hospice 09/19/23  Severe Malnutrition in the Context of Chronic illness/ Underweight / Hx of Use of TPN during previous Hospitalizations at [facility]: Nutrition Status: Nutrition Problem: Severe Malnutrition Etiology: chronic illness (recurrent GI surgeries and hospitalizations (5 in the last 6 months), recurrent TPN use) Signs/Symptoms: severe fat depletion, severe muscle depletion, percent weight loss Percent weight loss: 24 % (3 months) Interventions: Ensure Enlive (each supplement provides 350kcal and 20 grams of protein), MVI, Magic cup -Against the use of NG tube and has been refusing TNF as well. -Oral intake had improved but then worsened due to nausea vomiting.  Hopefully it will improve going forward. Continue to encourage. -After further goals of care discussion palliative met with the patient and the husband and plan is to pursue home with Home Hospice  Nutrition Documentation    Flowsheet Row ED to Hosp-Admission (Discharged) from 09/03/2023 in [place] 6E Progressive [facility]  Nutrition Problem Severe Malnutrition  Etiology chronic illness  [recurrent GI surgeries and hospitalizations (5 in the last 6 months), recurrent TPN use]  Nutrition Goal Patient will meet greater than or equal to 90% of their needs  Interventions Ensure Enlive (each supplement provides 350kcal and 20 grams of protein), MVI, Magic cup     ,  Active Pressure Injury/Wound(s)     Pressure Ulcer  Duration          Pressure Injury 09/09/23 Sacrum Mid Stage 1 -  Intact skin with non-blanchable redness of a localized area usually over a bony prominence. 12 days            Consultants: General Surgery, palliative care medicine, nephrology, PCCM transfer, gastroenterology Procedures performed: As delineated as above Disposition:  Home with hospice Diet recommendation:  Discharge Diet Orders (From admission, onward)     Start     Ordered   09/19/23 0000  Diet general        09/19/23  1107           Regular diet DISCHARGE MEDICATION: Allergies as of 09/19/2023       Reactions   Barium-containing Compounds    hives   Percocet [oxycodone-acetaminophen] Itching, Other (See Comments)   migraine         Medication List     STOP taking these medications    amoxicillin 500 MG capsule Commonly known as: AMOXIL   fluconazole 150 MG tablet Commonly known as: DIFLUCAN       TAKE these medications    acetaminophen 500 MG tablet Commonly known as: TYLENOL Take 1,000 mg by mouth every 6 (six) hours as needed.  Acidophilus Lactobacillus Caps Take 1 capsule by mouth 2 (two) times daily.   ascorbic acid 500 MG tablet Commonly known as: VITAMIN C Take 0.5 tablets (250 mg total) by mouth 2 (two) times daily.   cetirizine 10 MG tablet Commonly known as: ZYRTEC Take 10 mg by mouth daily as needed for allergies or rhinitis.   cholestyramine light 4 g packet Commonly known as: PREVALITE Take 4 g by mouth 2 (two) times daily.   cyanocobalamin 1000 MCG tablet Take 1 tablet (1,000 mcg total) by mouth daily.   diphenoxylate-atropine 2.5-0.025 MG tablet Commonly known as: LOMOTIL Take 1 tablet by mouth 4 (four) times daily. What changed:  how much to take when to take this reasons to take this   dronabinol 2.5 MG capsule Commonly known as: MARINOL Take 2.5 mg by mouth 2 (two) times daily as needed (Appetite).   feeding supplement Liqd Take 237 mLs by mouth 3 (three) times daily between meals.   fluticasone 50 MCG/ACT nasal spray Commonly known as: FLONASE Place 1 spray into both nostrils daily as needed for allergies or rhinitis.   folic acid 1 MG tablet Commonly known as: FOLVITE Take 1 tablet (1 mg total) by mouth daily.   gabapentin 100 MG capsule Commonly known as: NEURONTIN Take 1 capsule (100 mg total) by mouth every 8 (eight) hours.   levothyroxine 25 MCG tablet Commonly known as: SYNTHROID Take 1 tablet (25 mcg total) by mouth  daily.   lipase/protease/amylase [address] UNITS Cpep capsule Commonly known as: CREON Take 1 capsule (36,000 Units total) by mouth 3 (three) times daily with meals.   loperamide 2 MG capsule Commonly known as: IMODIUM Take 2 capsules (4 mg total) by mouth every 6 (six) hours. What changed:  how much to take when to take this reasons to take this   midodrine 5 MG tablet Commonly known as: PROAMATINE Take 3 tablets (15 mg total) by mouth 3 (three) times daily with meals.   mirtazapine 7.5 MG tablet Commonly known as: REMERON Take 7.5 mg by mouth at bedtime as needed (Anxiety/appetite).   multivitamin tablet Take 1 tablet by mouth daily.   omeprazole 20 MG capsule Commonly known as: PRILOSEC Take 20 mg by mouth daily as needed (Acid reflux).   ondansetron 4 MG tablet Commonly known as: ZOFRAN Take 1 tablet (4 mg total) by mouth every 6 (six) hours as needed for nausea or vomiting.   polycarbophil 625 MG tablet Commonly known as: FIBERCON Take 1 tablet (625 mg total) by mouth 2 (two) times daily.   promethazine 12.5 MG tablet Commonly known as: PHENERGAN Take 12.5 mg by mouth every 6 (six) hours as needed for nausea or vomiting.   simvastatin 20 MG tablet Commonly known as: ZOCOR Take 20 mg by mouth at bedtime.   sodium chloride 1 g tablet Take 1 tablet (1 g total) by mouth 2 (two) times daily with a meal for 14 days.   thiamine 100 MG tablet Commonly known as: VITAMIN B1 Take 1 tablet (100 mg total) by mouth daily.   Vitamin D (Ergocalciferol) 1.25 MG (50000 UNIT) Caps capsule Commonly known as: DRISDOL Take 1 capsule (50,000 Units total) by mouth every 7 (seven) days. Start taking on: September 23, 2023               Discharge Care Instructions  (From admission, onward)           Start     Ordered   09/19/23 0000  Discharge  wound care:       Comments: Frequent turning   09/19/23 1107            Follow-up Information     [facility] Follow up.   Why: Office to call with a visit time for the initial assessment. Contact information: Address: [address] [place], [place] [address] Phone: [phone]               Discharge Exam: Filed Weights   09/17/23 0349 09/18/23 0442 09/19/23 0556  Weight: 41.5 kg 41.8 kg 42.7 kg   Vitals:   09/19/23 0556 09/19/23 0718  BP: 92/60 90/62  Pulse: 61 67  Resp: 16 18  Temp: 97.7 F (36.5 C) 97.8 F (36.6 C)  SpO2: 100% 98%   Examination: Physical Exam:  Constitutional: Thin chronically ill-appearing Caucasian female in no acute distress Respiratory: Diminished to auscultation bilaterally, no wheezing, rales, rhonchi or crackles. Normal respiratory effort and patient is not tachypenic. No accessory muscle use.  Unlabored breathing Cardiovascular: RRR, no murmurs / rubs / gallops. S1 and S2 auscultated. No extremity edema.  Abdomen: Soft, non-tender, non-distended. Has a ileostomy with output bowel sounds positive.  GU: Deferred. Musculoskeletal: No clubbing / cyanosis of digits/nails. No joint deformity upper and lower extremities.  Skin: No rashes, lesions, ulcers on limited skin evaluation. No induration; Warm and dry.  Neurologic: CN 2-12 grossly intact with no focal deficits.  Romberg sign and cerebellar reflexes not assessed.  Psychiatric: She is awake and alert and appears calm  Condition at discharge: stable  The results of significant diagnostics from this hospitalization (including imaging, microbiology, ancillary and laboratory) are listed below for reference.   Imaging Studies: DG Abd Portable 1V Result Date: 09/15/2023 CLINICAL DATA:  Nausea and vomiting EXAM: PORTABLE ABDOMEN - 1 VIEW COMPARISON:  CT scan 09/03/2023 FINDINGS: Right nephrostomy tube observed, coil in the expected location. Distended appearing stomach, otherwise unremarkable bowel gas pattern. Irregular soft tissue calcifications in the lower anatomic pelvis as on prior CT scan. No  dilated small or large bowel identified. IMPRESSION: 1. Distended appearing stomach, otherwise unremarkable bowel gas pattern. 2. Right nephrostomy tube coil in the expected location. 3. Irregular soft tissue calcifications in the lower anatomic pelvis as on prior CT scan. Electronically Signed   By: Gaylyn Rong M.D.   On: 09/15/2023 18:54   DG CHEST PORT 1 VIEW Result Date: 09/09/2023 CLINICAL DATA:  Status post central line placement EXAM: PORTABLE CHEST 1 VIEW COMPARISON:  09/04/2023 FINDINGS: Right jugular central line is again seen. No pneumothorax is identified. The lungs are clear bilaterally. No cardiac abnormality is noted. IMPRESSION: Right jugular central line without complicating factors. Electronically Signed   By: Alcide Clever M.D.   On: 09/09/2023 02:59   IR NEPHROSTOMY EXCHANGE RIGHT Result Date: 09/08/2023 INDICATION: Right nephrostomy tube requiring exchange. Tube originally placed on 05/08/2023 and last exchanged on 07/09/2023. EXAM: RIGHT PERCUTANEOUS NEPHROSTOMY TUBE EXCHANGE UNDER FLUOROSCOPY COMPARISON:  None Available. MEDICATIONS: None ANESTHESIA/SEDATION: None CONTRAST:  10 mL Omnipaque 300-administered into the collecting system(s) FLUOROSCOPY: Radiation Exposure Index (as provided by the fluoroscopic device): 1.0 mGy Kerma COMPLICATIONS: None immediate. PROCEDURE: Informed written consent was obtained from the patient after a thorough discussion of the procedural risks, benefits and alternatives. All questions were addressed. Maximal Sterile Barrier Technique was utilized including caps, mask, sterile gowns, sterile gloves, sterile drape, hand hygiene and skin antiseptic. A timeout was performed prior to the initiation of the procedure. A pre-existing right-sided 10 [place]  nephrostomy tube was injected with contrast material, cut and removed over a guidewire. A new 10 French catheter was advanced over the wire and formed in the right renal pelvis. Catheter position was  confirmed with a saved fluoroscopic spot image obtained after injection of contrast. The catheter was secured at the skin with a Prolene retention suture and adhesive StatLock device. It was attached to a new gravity drainage bag. IMPRESSION: Exchange of indwelling 10 French percutaneous nephrostomy tube under fluoroscopy. Electronically Signed   By: Irish Lack M.D.   On: 09/08/2023 15:38   DG Chest Port 1 View Result Date: 09/04/2023 CLINICAL DATA:  Central line placement EXAM: PORTABLE CHEST 1 VIEW COMPARISON:  06/09/2023 FINDINGS: Right internal jugular central line tip in the SVC. No pneumothorax. Mild hyperinflation of the lungs. Heart and mediastinal contours are within normal limits. No focal opacities or effusions. No acute bony abnormality. Aortic atherosclerosis. IMPRESSION: Right central line tip in the SVC.  No pneumothorax. No active disease. Electronically Signed   By: Charlett Nose M.D.   On: 09/04/2023 00:47   CT ABDOMEN PELVIS WO CONTRAST Result Date: 09/03/2023 CLINICAL DATA:  Kidney failure, acute kidney failure-evaluate for obstruction. CT with out contrast EXAM: CT ABDOMEN AND PELVIS WITHOUT CONTRAST TECHNIQUE: Multidetector CT imaging of the abdomen and pelvis was performed following the standard protocol without IV contrast. RADIATION DOSE REDUCTION: This exam was performed according to the departmental dose-optimization program which includes automated exposure control, adjustment of the mA and/or kV according to patient size and/or use of iterative reconstruction technique. COMPARISON:  06/09/2023 FINDINGS: Lower chest: No acute abnormality Hepatobiliary: No focal liver abnormality is seen. Status post cholecystectomy. No biliary dilatation. Pancreas: No focal abnormality or ductal dilatation. Spleen: No focal abnormality.  Normal size. Adrenals/Urinary Tract: Stable left adrenal nodule. Right adrenal gland unremarkable. Left kidney is atrophic, unchanged. Right nephrostomy  catheter remains in place. No hydronephrosis. Previously seen right ureteral stent no longer visualized. Urinary bladder unremarkable. Stomach/Bowel: Right lower quadrant ostomy again noted. No evidence of bowel obstruction. Stomach unremarkable. No evidence of bowel obstruction or inflammatory process. Vascular/Lymphatic: Aortoiliac atherosclerosis. No evidence of aneurysm or adenopathy. Reproductive: Status post hysterectomy. No adnexal mass. Air within the vagina is unchanged since prior study. Other: No free fluid or free air. Stable postoperative changes in the pelvis. Musculoskeletal: No acute bony abnormality. IMPRESSION: Right nephrostomy tube in place. No hydronephrosis. Left kidney remains atrophic and unchanged since prior study. Right lower quadrant ostomy.  No evidence of bowel obstruction. Diffuse aortoiliac atherosclerosis. Stable postoperative changes in the pelvis. Air within the vagina of unknown etiology. This is stable when compared to prior study. Electronically Signed   By: Charlett Nose M.D.   On: 09/03/2023 21:14   Microbiology: Results for orders placed or performed during the hospital encounter of 09/03/23  Culture, blood (Routine X 2) w Reflex to ID Panel     Status: None   Collection Time: 09/03/23 10:45 PM   Specimen: BLOOD RIGHT ARM  Result Value Ref Range Status   Specimen Description BLOOD RIGHT ARM  Final   Special Requests   Final    BOTTLES DRAWN AEROBIC AND ANAEROBIC Blood Culture results may not be optimal due to an inadequate volume of blood received in culture bottles   Culture   Final    NO GROWTH 5 DAYS Performed at [facility], [address], [place], [place] [address]    Report Status 09/08/2023 FINAL  Final  MRSA Next Gen  by PCR, Nasal     Status: None   Collection Time: 09/03/23 10:51 PM   Specimen: Nasal Mucosa; Nasal Swab  Result Value Ref Range Status   MRSA by PCR Next Gen NOT DETECTED NOT DETECTED Final    Comment: (NOTE) The GeneXpert  MRSA Assay (FDA approved for NASAL specimens only), is one component of a comprehensive MRSA colonization surveillance program. It is not intended to diagnose MRSA infection nor to guide or monitor treatment for MRSA infections. Test performance is not FDA approved in patients less than 7 years old. Performed at [facility], [address], [place], [place] [address]   Culture, blood (Routine X 2) w Reflex to ID Panel     Status: None   Collection Time: 09/04/23  6:18 AM   Specimen: BLOOD RIGHT HAND  Result Value Ref Range Status   Specimen Description BLOOD RIGHT HAND  Final   Special Requests   Final    BOTTLES DRAWN AEROBIC AND ANAEROBIC Blood Culture adequate volume   Culture   Final    NO GROWTH 5 DAYS Performed at [facility], [address] [place], [place] [address]    Report Status 09/09/2023 FINAL  Final  Culture, blood (Routine X 2) w Reflex to ID Panel     Status: None   Collection Time: 09/04/23  6:21 AM   Specimen: BLOOD RIGHT ARM  Result Value Ref Range Status   Specimen Description BLOOD RIGHT ARM  Final   Special Requests   Final    BOTTLES DRAWN AEROBIC AND ANAEROBIC Blood Culture adequate volume   Culture   Final    NO GROWTH 5 DAYS Performed at [facility], [address] [place], [place] [address]    Report Status 09/09/2023 FINAL  Final  Urine Culture (for pregnant, neutropenic or urologic patients or patients with an indwelling urinary catheter)     Status: Abnormal   Collection Time: 09/05/23  1:15 PM   Specimen: Urine, Catheterized  Result Value Ref Range Status   Specimen Description URINE, CATHETERIZED  Final   Special Requests   Final    NONE Performed at [facility], [address], [place], [place] [address]    Culture 50,000 COLONIES/mL YEAST (A)  Final   Report Status 09/06/2023 FINAL  Final  Gastrointestinal Panel by PCR , Stool     Status: None   Collection Time: 09/06/23  9:26 AM   Specimen: Ileostomy;  Stool  Result Value Ref Range Status   Campylobacter species NOT DETECTED NOT DETECTED Final   Plesimonas shigelloides NOT DETECTED NOT DETECTED Final   Salmonella species NOT DETECTED NOT DETECTED Final   Yersinia enterocolitica NOT DETECTED NOT DETECTED Final   Vibrio species NOT DETECTED NOT DETECTED Final   Vibrio cholerae NOT DETECTED NOT DETECTED Final   Enteroaggregative E coli (EAEC) NOT DETECTED NOT DETECTED Final   Enteropathogenic E coli (EPEC) NOT DETECTED NOT DETECTED Final   Enterotoxigenic E coli (ETEC) NOT DETECTED NOT DETECTED Final   Shiga like toxin producing E coli (STEC) NOT DETECTED NOT DETECTED Final   Shigella/Enteroinvasive E coli (EIEC) NOT DETECTED NOT DETECTED Final   Cryptosporidium NOT DETECTED NOT DETECTED Final   Cyclospora cayetanensis NOT DETECTED NOT DETECTED Final   Entamoeba histolytica NOT DETECTED NOT DETECTED Final   Giardia lamblia NOT DETECTED NOT DETECTED Final   Adenovirus F40/41 NOT DETECTED NOT DETECTED Final   Astrovirus NOT DETECTED NOT DETECTED Final  Norovirus GI/GII NOT DETECTED NOT DETECTED Final   Rotavirus A NOT DETECTED NOT DETECTED Final   Sapovirus (I, II, IV, and V) NOT DETECTED NOT DETECTED Final    Comment: Performed at [facility], [address] [place], [place] [address]   Labs: CBC: Recent Labs  Lab 09/17/23 0456 09/18/23 0548  WBC 4.3 3.1*  NEUTROABS  --  1.7  HGB 9.0* 8.4*  HCT 25.9* 23.5*  MCV 98.5 97.5  PLT 215 197   Basic Metabolic Panel: Recent Labs  Lab 09/15/23 0550 09/16/23 0554 09/17/23 0456 09/18/23 0548  NA 137 137 134* 135  K 3.9 3.6 3.2* 3.7  CL 99 100 92* 94*  CO2 23 27 30 28   GLUCOSE 98 117* 119* 103*  BUN 33* 29* 36* 41*  CREATININE 2.64* 2.53* 2.61* 2.52*  CALCIUM 9.2 9.0 9.3 9.1  MG  --   --  1.6* 2.0  PHOS  --   --  2.5 2.2*   Liver Function Tests: Recent Labs  Lab 09/18/23 0548  AST 21  ALT 25  ALKPHOS 146*  BILITOT 0.5  PROT 6.1*  ALBUMIN 2.6*    CBG: Recent Labs  Lab 09/18/23 0745 09/18/23 1141 09/18/23 1616 09/18/23 2126 09/19/23 0801  GLUCAP 114* 244* 167* 168* 182*   Discharge time spent: greater than 30 minutes.  Signed: Marguerita Merles, DO [facility] 09/21/2023

## 2023-10-16 DEATH — deceased
